# Patient Record
Sex: Female | Born: 1965 | Race: White | Hispanic: No | Smoking: Former smoker
Health system: Southern US, Community
[De-identification: ages and names within clinical notes are randomized; demographics above are authoritative.]

## PROBLEM LIST (undated history)

## (undated) DIAGNOSIS — M549 Dorsalgia, unspecified: Secondary | ICD-10-CM

## (undated) DIAGNOSIS — M459 Ankylosing spondylitis of unspecified sites in spine: Secondary | ICD-10-CM

## (undated) DIAGNOSIS — I10 Essential (primary) hypertension: Secondary | ICD-10-CM

## (undated) DIAGNOSIS — G8929 Other chronic pain: Secondary | ICD-10-CM

## (undated) DIAGNOSIS — M1990 Unspecified osteoarthritis, unspecified site: Secondary | ICD-10-CM

## (undated) DIAGNOSIS — I493 Ventricular premature depolarization: Secondary | ICD-10-CM

## (undated) DIAGNOSIS — S7290XA Unspecified fracture of unspecified femur, initial encounter for closed fracture: Secondary | ICD-10-CM

## (undated) DIAGNOSIS — K219 Gastro-esophageal reflux disease without esophagitis: Secondary | ICD-10-CM

## (undated) DIAGNOSIS — G43909 Migraine, unspecified, not intractable, without status migrainosus: Secondary | ICD-10-CM

## (undated) DIAGNOSIS — I7300 Raynaud's syndrome without gangrene: Secondary | ICD-10-CM

## (undated) DIAGNOSIS — F32A Depression, unspecified: Secondary | ICD-10-CM

## (undated) DIAGNOSIS — F909 Attention-deficit hyperactivity disorder, unspecified type: Secondary | ICD-10-CM

## (undated) DIAGNOSIS — R011 Cardiac murmur, unspecified: Secondary | ICD-10-CM

## (undated) HISTORY — PX: BACK SURGERY: SHX140

## (undated) HISTORY — PX: TUBAL LIGATION: SHX77

## (undated) HISTORY — PX: ARTHRODESIS: SHX136

## (undated) HISTORY — DX: Attention-deficit hyperactivity disorder, unspecified type: F90.9

## (undated) HISTORY — DX: Unspecified osteoarthritis, unspecified site: M19.90

## (undated) HISTORY — DX: Unspecified fracture of unspecified femur, initial encounter for closed fracture: S72.90XA

## (undated) HISTORY — PX: KNEE SURGERY: SHX244

## (undated) HISTORY — PX: VAGINAL HYSTERECTOMY: SUR661

## (undated) HISTORY — DX: Raynaud's syndrome without gangrene: I73.00

## (undated) HISTORY — PX: CERVICAL BIOPSY  W/ LOOP ELECTRODE EXCISION: SUR135

## (undated) HISTORY — PX: OSTEOTOMY: SHX137

## (undated) HISTORY — PX: BLADDER SURGERY: SHX569

## (undated) HISTORY — PX: BUNIONECTOMY: SHX129

## (undated) HISTORY — PX: HAMMER TOE SURGERY: SHX385

## (undated) HISTORY — PX: TOTAL KNEE ARTHROPLASTY: SHX125

## (undated) HISTORY — PX: ENDOMETRIAL BIOPSY: SHX622

## (undated) HISTORY — PX: HEEL SPUR SURGERY: SHX665

## (undated) HISTORY — DX: Ankylosing spondylitis of unspecified sites in spine: M45.9

## (undated) HISTORY — PX: SHOULDER SURGERY: SHX246

---

## 2001-07-30 ENCOUNTER — Encounter: Admission: RE | Admit: 2001-07-30 | Discharge: 2001-10-28 | Payer: Self-pay | Admitting: Internal Medicine

## 2003-08-16 ENCOUNTER — Ambulatory Visit (HOSPITAL_BASED_OUTPATIENT_CLINIC_OR_DEPARTMENT_OTHER): Admission: RE | Admit: 2003-08-16 | Discharge: 2003-08-16 | Payer: Self-pay | Admitting: Orthopedic Surgery

## 2003-09-16 ENCOUNTER — Encounter (HOSPITAL_COMMUNITY): Admission: RE | Admit: 2003-09-16 | Discharge: 2003-10-16 | Payer: Self-pay | Admitting: Orthopedic Surgery

## 2004-09-18 ENCOUNTER — Emergency Department (HOSPITAL_COMMUNITY): Admission: EM | Admit: 2004-09-18 | Discharge: 2004-09-18 | Payer: Self-pay | Admitting: *Deleted

## 2005-04-05 ENCOUNTER — Other Ambulatory Visit: Admission: RE | Admit: 2005-04-05 | Discharge: 2005-04-05 | Payer: Self-pay | Admitting: Obstetrics and Gynecology

## 2005-04-16 ENCOUNTER — Encounter: Admission: RE | Admit: 2005-04-16 | Discharge: 2005-04-16 | Payer: Self-pay | Admitting: Orthopedic Surgery

## 2005-05-07 ENCOUNTER — Ambulatory Visit (HOSPITAL_COMMUNITY): Admission: RE | Admit: 2005-05-07 | Discharge: 2005-05-08 | Payer: Self-pay | Admitting: Neurosurgery

## 2005-09-30 ENCOUNTER — Encounter: Admission: RE | Admit: 2005-09-30 | Discharge: 2005-09-30 | Payer: Self-pay | Admitting: Obstetrics and Gynecology

## 2007-02-10 ENCOUNTER — Other Ambulatory Visit: Admission: RE | Admit: 2007-02-10 | Discharge: 2007-02-10 | Payer: Self-pay | Admitting: Obstetrics & Gynecology

## 2007-03-19 DIAGNOSIS — Z8614 Personal history of Methicillin resistant Staphylococcus aureus infection: Secondary | ICD-10-CM

## 2007-03-19 HISTORY — DX: Personal history of Methicillin resistant Staphylococcus aureus infection: Z86.14

## 2007-06-08 ENCOUNTER — Inpatient Hospital Stay (HOSPITAL_COMMUNITY): Admission: RE | Admit: 2007-06-08 | Discharge: 2007-06-10 | Payer: Self-pay | Admitting: Orthopedic Surgery

## 2008-04-09 ENCOUNTER — Emergency Department (HOSPITAL_COMMUNITY): Admission: EM | Admit: 2008-04-09 | Discharge: 2008-04-09 | Payer: Self-pay | Admitting: Emergency Medicine

## 2008-07-05 ENCOUNTER — Encounter: Admission: RE | Admit: 2008-07-05 | Discharge: 2008-07-05 | Payer: Self-pay | Admitting: Orthopedic Surgery

## 2008-07-26 ENCOUNTER — Encounter: Admission: RE | Admit: 2008-07-26 | Discharge: 2008-07-26 | Payer: Self-pay | Admitting: Orthopedic Surgery

## 2008-11-10 ENCOUNTER — Encounter: Admission: RE | Admit: 2008-11-10 | Discharge: 2008-11-10 | Payer: Self-pay | Admitting: Orthopedic Surgery

## 2009-01-30 ENCOUNTER — Encounter: Admission: RE | Admit: 2009-01-30 | Discharge: 2009-01-30 | Payer: Self-pay | Admitting: Orthopedic Surgery

## 2009-05-11 ENCOUNTER — Ambulatory Visit (HOSPITAL_COMMUNITY): Admission: RE | Admit: 2009-05-11 | Discharge: 2009-05-11 | Payer: Self-pay | Admitting: Family Medicine

## 2009-10-06 LAB — HM DEXA SCAN

## 2010-04-08 ENCOUNTER — Encounter: Payer: Self-pay | Admitting: Orthopedic Surgery

## 2010-04-08 ENCOUNTER — Encounter: Payer: Self-pay | Admitting: Obstetrics & Gynecology

## 2010-07-24 ENCOUNTER — Ambulatory Visit (HOSPITAL_COMMUNITY)
Admission: RE | Admit: 2010-07-24 | Discharge: 2010-07-24 | Disposition: A | Discharge status: Home / Self Care | Payer: BC Managed Care – PPO | Source: Ambulatory Visit | Attending: Orthopedic Surgery | Admitting: Orthopedic Surgery

## 2010-07-24 ENCOUNTER — Other Ambulatory Visit (HOSPITAL_COMMUNITY): Payer: Self-pay | Admitting: Orthopedic Surgery

## 2010-07-24 ENCOUNTER — Encounter (HOSPITAL_COMMUNITY)
Admission: RE | Admit: 2010-07-24 | Discharge: 2010-07-24 | Disposition: A | Discharge status: Home / Self Care | Payer: BC Managed Care – PPO | Source: Ambulatory Visit | Attending: Orthopedic Surgery | Admitting: Orthopedic Surgery

## 2010-07-24 DIAGNOSIS — J42 Unspecified chronic bronchitis: Secondary | ICD-10-CM | POA: Insufficient documentation

## 2010-07-24 DIAGNOSIS — Z01810 Encounter for preprocedural cardiovascular examination: Secondary | ICD-10-CM | POA: Insufficient documentation

## 2010-07-24 DIAGNOSIS — Z01811 Encounter for preprocedural respiratory examination: Secondary | ICD-10-CM

## 2010-07-24 DIAGNOSIS — Z01818 Encounter for other preprocedural examination: Secondary | ICD-10-CM | POA: Insufficient documentation

## 2010-07-24 DIAGNOSIS — Z01812 Encounter for preprocedural laboratory examination: Secondary | ICD-10-CM | POA: Insufficient documentation

## 2010-07-24 LAB — URINALYSIS, ROUTINE W REFLEX MICROSCOPIC
Bilirubin Urine: NEGATIVE
Glucose, UA: NEGATIVE mg/dL
Hgb urine dipstick: NEGATIVE
Ketones, ur: NEGATIVE mg/dL
Nitrite: NEGATIVE
Protein, ur: NEGATIVE mg/dL
Specific Gravity, Urine: 1.029 (ref 1.005–1.030)
Urobilinogen, UA: 0.2 mg/dL (ref 0.0–1.0)
pH: 6 (ref 5.0–8.0)

## 2010-07-24 LAB — COMPREHENSIVE METABOLIC PANEL
ALT: 21 U/L (ref 0–35)
AST: 18 U/L (ref 0–37)
Albumin: 3.5 g/dL (ref 3.5–5.2)
Alkaline Phosphatase: 70 U/L (ref 39–117)
BUN: 17 mg/dL (ref 6–23)
CO2: 28 mEq/L (ref 19–32)
Calcium: 9.2 mg/dL (ref 8.4–10.5)
Chloride: 105 mEq/L (ref 96–112)
Creatinine, Ser: 0.59 mg/dL (ref 0.4–1.2)
GFR calc Af Amer: >60 mL/min (ref >60)
GFR calc non Af Amer: >60 mL/min (ref >60)
Glucose, Bld: 81 mg/dL (ref 70–99)
Potassium: 3.7 mEq/L (ref 3.5–5.1)
Sodium: 142 mEq/L (ref 135–145)
Total Bilirubin: 0.3 mg/dL (ref 0.3–1.2)
Total Protein: 6.2 g/dL (ref 6.0–8.3)

## 2010-07-24 LAB — PROTIME-INR
INR: 0.95 (ref 0.00–1.49)
Prothrombin Time: 12.9 seconds (ref 11.6–15.2)

## 2010-07-24 LAB — URINE MICROSCOPIC-ADD ON

## 2010-07-24 LAB — CBC
HCT: 37.7 % (ref 36.0–46.0)
Hemoglobin: 12.5 g/dL (ref 12.0–15.0)
MCH: 27.2 pg (ref 26.0–34.0)
MCHC: 33.2 g/dL (ref 30.0–36.0)
MCV: 82.1 fL (ref 78.0–100.0)
Platelets: 218 10*3/uL (ref 150–400)
RBC: 4.59 MIL/uL (ref 3.87–5.11)
RDW: 13.8 % (ref 11.5–15.5)
WBC: 8.2 10*3/uL (ref 4.0–10.5)

## 2010-07-24 LAB — DIFFERENTIAL
Basophils Absolute: 0 10*3/uL (ref 0.0–0.1)
Basophils Relative: 0 % (ref 0–1)
Eosinophils Absolute: 0.1 10*3/uL (ref 0.0–0.7)
Eosinophils Relative: 1 % (ref 0–5)
Lymphocytes Relative: 45 % (ref 12–46)
Lymphs Abs: 3.7 10*3/uL (ref 0.7–4.0)
Monocytes Absolute: 0.8 10*3/uL (ref 0.1–1.0)
Monocytes Relative: 10 % (ref 3–12)
Neutro Abs: 3.6 10*3/uL (ref 1.7–7.7)
Neutrophils Relative %: 44 % (ref 43–77)

## 2010-07-24 LAB — SURGICAL PCR SCREEN
MRSA, PCR: NEGATIVE
Staphylococcus aureus: NEGATIVE

## 2010-07-24 LAB — APTT: aPTT: 26 seconds (ref 24–37)

## 2010-07-24 LAB — HEMOGLOBIN A1C
Hgb A1c MFr Bld: 5.5 % (ref <5.7)
Mean Plasma Glucose: 111 mg/dL (ref <117)

## 2010-07-25 LAB — URINE CULTURE
Colony Count: >=100000
Culture  Setup Time: 201205081632

## 2010-07-30 ENCOUNTER — Inpatient Hospital Stay (HOSPITAL_COMMUNITY)
Admission: RE | Admit: 2010-07-30 | Discharge: 2010-08-01 | DRG: 209 | Disposition: A | Discharge status: Home Health | Payer: BC Managed Care – PPO | Source: Ambulatory Visit | Attending: Orthopedic Surgery | Admitting: Orthopedic Surgery

## 2010-07-30 DIAGNOSIS — K5900 Constipation, unspecified: Secondary | ICD-10-CM | POA: Diagnosis present

## 2010-07-30 DIAGNOSIS — Z87891 Personal history of nicotine dependence: Secondary | ICD-10-CM

## 2010-07-30 DIAGNOSIS — D62 Acute posthemorrhagic anemia: Secondary | ICD-10-CM | POA: Diagnosis not present

## 2010-07-30 DIAGNOSIS — F411 Generalized anxiety disorder: Secondary | ICD-10-CM | POA: Diagnosis present

## 2010-07-30 DIAGNOSIS — M1710 Unilateral primary osteoarthritis, unspecified knee: Principal | ICD-10-CM | POA: Diagnosis present

## 2010-07-30 DIAGNOSIS — K219 Gastro-esophageal reflux disease without esophagitis: Secondary | ICD-10-CM | POA: Diagnosis present

## 2010-07-30 LAB — URINALYSIS, ROUTINE W REFLEX MICROSCOPIC
Bilirubin Urine: NEGATIVE
Glucose, UA: NEGATIVE mg/dL
Hgb urine dipstick: NEGATIVE
Ketones, ur: NEGATIVE mg/dL
Nitrite: NEGATIVE
Protein, ur: NEGATIVE mg/dL
Specific Gravity, Urine: 1.021 (ref 1.005–1.030)
Urobilinogen, UA: 0.2 mg/dL (ref 0.0–1.0)
pH: 5 (ref 5.0–8.0)

## 2010-07-30 LAB — TYPE AND SCREEN
ABO/RH(D): B POS
Antibody Screen: NEGATIVE

## 2010-07-31 LAB — CBC
HCT: 35.8 % — ABNORMAL LOW (ref 36.0–46.0)
Hemoglobin: 11.9 g/dL — ABNORMAL LOW (ref 12.0–15.0)
MCH: 27.2 pg (ref 26.0–34.0)
MCHC: 33.2 g/dL (ref 30.0–36.0)
MCV: 81.9 fL (ref 78.0–100.0)
Platelets: 177 10*3/uL (ref 150–400)
RBC: 4.37 MIL/uL (ref 3.87–5.11)
RDW: 13.8 % (ref 11.5–15.5)
WBC: 8 10*3/uL (ref 4.0–10.5)

## 2010-07-31 LAB — BASIC METABOLIC PANEL
BUN: 10 mg/dL (ref 6–23)
CO2: 26 mEq/L (ref 19–32)
Calcium: 8.5 mg/dL (ref 8.4–10.5)
Chloride: 102 mEq/L (ref 96–112)
Creatinine, Ser: <0.47 mg/dL (ref 0.4–1.2)
Glucose, Bld: 118 mg/dL — ABNORMAL HIGH (ref 70–99)
Potassium: 3.8 mEq/L (ref 3.5–5.1)
Sodium: 135 mEq/L (ref 135–145)

## 2010-07-31 LAB — URINE CULTURE
Colony Count: NO GROWTH
Culture  Setup Time: 201205141150
Culture: NO GROWTH

## 2010-07-31 NOTE — Op Note (Signed)
NAMEARIKA, MAINER NO.:  [id number]   MEDICAL RECORD NO.:  [id number]          PATIENT TYPE:  INP   LOCATION:  2899                         FACILITY:  [facility]   PHYSICIAN:  Elana Alm. Thurston Hole, M.D. DATE OF BIRTH:  [birth year]   DATE OF PROCEDURE:  06/08/2007  DATE OF DISCHARGE:                               OPERATIVE REPORT   PREOPERATIVE DIAGNOSIS:  Right knee degenerative joint disease.   POSTOPERATIVE DIAGNOSES:  Right knee degenerative joint disease.   PROCEDURE:  1. Right total knee replacement using DePuy cemented total knee system      with #3 cemented femur, #4 cemented tibia with  15-mm polyethylene RP tibial spacer and 32-mm tibial polyethylene  cemented patella.  1. Right total knee computed-assisted navigation.   SURGEON:  Elana Alm. Thurston Hole, M.D.   ASSISTANT:  Julien Girt, P.A.   ANESTHESIA:  General.   OPERATIVE TIME:  1 hour and 45 minutes.   COMPLICATIONS:  None.   DESCRIPTION OF PROCEDURE:  Ms. Eversley was brought to the operating  room on June 08, 2007 after a femoral nerve block was placed in the  holding room by Anesthesia.  She was placed on the operating room table  in the supine position.  She received Ancef 2 grams IV preoperatively  for prophylaxis.  After being placed under general anesthesia, her right  knee was examined.  Range of motion for minus 5 down at 25 degrees with  medial femoral tibial subluxation with a varus deformity, knee stable,  ligamentous exam with normal patellar tracking.  She had a Foley  catheter in place under sterile conditions and received Ancef 2 g IV  preoperatively for prophylaxis.  The right leg and foot was sterilely  prepped and draped using sterile technique.  The leg was exsanguinated  and a thigh tourniquet elevated at 375-mm.  Initially, through a 15-cm  longitudinal incision placed over the patella, initial exposure was  made.  She had had a previous medial arthrotomy many years ago  for an  open meniscectomy but this incision was kept at least 8 to 9-cm away  from the previous incision to prevent any skin injury.  Then all  subcutaneous tissues were incised along the skin incision.  A median  arthrotomy was performed revealing excessive now normal appearing joint  fluid.  The articular surfaces were infected.  She had severe DJD with  grade IV changes throughout the mediolateral patellofemoral joints with  large osteophytes in  the femoral condyles which were removed and tibial  plateau which was removed.  The medial and lateral meniscal remnants  were removed.  There was no anterior cruciate ligament remaining.  At  this point, due to the severe deformity, the computer navigation system  was absolutely necessary.  Two pins were placed in the tibia through the  small incision areas in the medial tibia.  Two distal femoral pins were  also placed as well.  Each of the navigation system apparatuses were  then hooked to these pins and the navigation system was registered and  activated.  Initial deformities showed minus 5 degrees of  flexion and  minus 5degrees of varus with subluxation of the medial femur on the  tibia.  At this point, then using the navigation system, a distal 11-mm  cut was made on the distal femur correcting this deformity.  These cuts  were verified.  The size of the femur was measured to be a #3.  The #3  cutting jig was placed also using the navigation system and the  appropriate amount of external rotation of the knee cuts were made and  verified.  The proximal tibia was then cut removing 4-mm off the medial  or lower side, 8-mm off of the higher side, again with navigation being  used to verify the cuts.  After this was done, the spacer blocks were  placed in flexion and extension.  Blocks of 15-mm gave excellent  stability, excellent correction of her subluxation as well as her  flexion and varus deformities and these were verified with the   navigation system as well.  At this point, the #4 tibial baseplate  trowel was placed on the cut tibial surface and the Keel cut was made.  The #3 femoral PCL block cutter was placed on the distal femur and these  cuts were made.  At this point, the #3 femoral trowel was placed and  with the #4 tibial baseplate trowel and a  15-mm polyethylene RP tibial spacer, the knee was reduced and taken  through a range of motion from 0 to 125 degrees with excellent stability  and excellent correction of her flexion, varus, and subluxation  deformities with normal patella tracking. There was surfacing.  An 8-mm  cut was then made on the patella and 3 locking holes placed with a 32-mm  patella trowel which was placed and again patellofemoral tracking was  found to be normal.  At this point, with the navigation system verified  that all cuts were made perfect and exact with correction of her  deformities, the navigation system was deactivated and the pins were  removed.  The trowel components were then removed.  The knee was then  gently lavaged irrigated with 3 liters of saline.  The proximal tibia  was then exposed and a #4 tibial bas plate was cemented into position  with an excellent fit with excess cement being removed from around the  edges.  A #3 femoral component was cemented back and it was hammered  into position also with an excellent fit with excess cement being  removed  from around the edges.  The 15-mm polyethylene RP tibial spacer  was placed on tibial baseplate, the knee reduced, taken through range of  motion from 0 to 125 degrees with excellent stability and excellent  correction of her flexion, varus, and subluxation deformities and normal  patella tracking with the 32-mm polyethylene cemented back patella in  position.  At this point, it was felt that all components were of  excellent size, fit, and stability.  The wound was further irrigated  with saline.  The tourniquet was  released.  Hemostasis was obtained with  cautery.  The knee was then closed with #1-Ethibond suture over 2 medium  Hemovac drains.  The subcutaneous tissue was closed with 0 and 2-0  Vicryl, subcuticular layer closed with 4-0 Monocryl.  Sterile dressings  and a long leg splint applied.  The patient was then awakened,  extubated, and taken to the recovery room in stable condition.  Needle  and sponge count was correct x2 at the end of  the case. Neurovascular  status was normal postoperatively as well.      Robert A. Thurston Hole, M.D.  Electronically Signed     RAW/MEDQ  D:  06/08/2007  T:  06/08/2007  Job:  [id number]

## 2010-08-01 LAB — BASIC METABOLIC PANEL
BUN: 8 mg/dL (ref 6–23)
CO2: 28 mEq/L (ref 19–32)
Calcium: 8.4 mg/dL (ref 8.4–10.5)
Chloride: 99 mEq/L (ref 96–112)
Creatinine, Ser: <0.47 mg/dL (ref 0.4–1.2)
Glucose, Bld: 133 mg/dL — ABNORMAL HIGH (ref 70–99)
Potassium: 3.9 mEq/L (ref 3.5–5.1)
Sodium: 133 mEq/L — ABNORMAL LOW (ref 135–145)

## 2010-08-01 LAB — CBC
HCT: 30.4 % — ABNORMAL LOW (ref 36.0–46.0)
Hemoglobin: 10.2 g/dL — ABNORMAL LOW (ref 12.0–15.0)
MCH: 27.4 pg (ref 26.0–34.0)
MCHC: 33.6 g/dL (ref 30.0–36.0)
MCV: 81.7 fL (ref 78.0–100.0)
Platelets: 148 10*3/uL — ABNORMAL LOW (ref 150–400)
RBC: 3.72 MIL/uL — ABNORMAL LOW (ref 3.87–5.11)
RDW: 13.9 % (ref 11.5–15.5)
WBC: 8.8 10*3/uL (ref 4.0–10.5)

## 2010-08-03 NOTE — Discharge Summary (Signed)
Lauren Greene, Lauren Greene NO.:  [id number]   MEDICAL RECORD NO.:  [id number]          PATIENT TYPE:  INP   LOCATION:  5004                         FACILITY:  [facility]   PHYSICIAN:  Kirstin Shepperson, P.A.DATE OF BIRTH:  December 17, 1965   DATE OF ADMISSION:  06/08/2007  DATE OF DISCHARGE:  06/10/2007                               DISCHARGE SUMMARY   ADMITTING DIAGNOSES:  1. End-stage degenerative joint disease, right knee.  2. Depression.  3. Obesity.   DISCHARGE DIAGNOSES:  1. End-stage degenerative joint disease, right knee.  2. Depression.  3. Obesity.  4. Postoperative blood loss anemia.   HISTORY OF PRESENT ILLNESS:  The patient is a 45 year old white female  with a history of end-stage DJD of her right knee.  She has post-  traumatic DJD for multiple knee injuries as a child.  She has failed  conservative care including anti-inflammatories and interarticular  cortisone injections.  She understands the risks, benefits, and possible  complications of the right total knee replacement and is without  question.   PROCEDURES:  In house on June 08, 2007, the patient underwent a right  total knee replacement by Dr. Wyline Mood, a right femoral nerve block by  anesthesia, and tolerance of blood donation.  She tolerated all three  procedures well.  In the PACU, she was placed on CPM 0-90 degrees and  tolerated this without difficulty.  On postop day #1, the patient's  hemoglobin was 12.3, her temperature was 97.5.  She was metabolically  stable.  She tolerated her CPM 0-90 degrees.  She did very well with  physical therapy.  On our initial evaluation, she is ambulating 100  feet.  On postop day #2, hemoglobin was 11.1.  She was metabolically  stable and her INR was 1.2.  She tolerated 0-65 on our CPM.  She was  given Dulcolax suppository that produced effective results.  She was  discharged to home in stable condition.  Weightbearing as tolerated on a  regular diet.  CPM  0-65 degrees 8 hours a day, increasing by 5 degrees a  day.  Instructions on how stretch her right leg for 30 minutes twice a  day to work on extension.  Call us with increased pain, increased  swelling, increased redness, increased drainage, or a temperature  greater than 101.   DISCHARGE MEDICATIONS:  1. Celexa 40 mg daily.  2. Percocet 10 1-2 q. 4-6 h. p.r.n. pain.  3. Robaxin 500 mg 1-2 q. 4-6 h. p.r.n. muscle spasm.  4. Coumadin 1/2 tablet a day.  5. Lovenox 30 mg subcu q.12 h. until INR 2.0 or higher.  6. Xanax as directed.   She will follow up on June 22, 2007, with Dr. Wyline Mood.  They have been  instructed to call with increased pain, increased swelling, increased  drainage, increased redness, or temperature greater than 101.      Julien Girt, P.A.     KS/MEDQ  D:  07/13/2007  T:  07/14/2007  Job:  [id number]

## 2010-08-03 NOTE — Op Note (Signed)
Lauren Greene, JERDE NO.:  [id number]   MEDICAL RECORD NO.:  [id number]                   PATIENT TYPE:  AMB   LOCATION:  DSC                                  FACILITY:  [facility]   PHYSICIAN:  Robert A. Thurston Hole, M.D.              DATE OF BIRTH:  05/15/1965   DATE OF PROCEDURE:  08/16/2003  DATE OF DISCHARGE:                                 OPERATIVE REPORT   PREOPERATIVE DIAGNOSES:  Right knee post traumatic degenerative joint  disease with medial and lateral meniscal tears, chondromalacia and  synovitis.   POSTOPERATIVE DIAGNOSES:  Right knee post traumatic degenerative joint  disease with medial and lateral meniscal tears, chondromalacia and  synovitis.   PROCEDURES:  1. Right knee EUA, followed by arthroscopic partial medial and lateral     meniscectomy.  2. Right knee chondroplasty with partial synovectomy.   SURGEON:  Elana Alm. Thurston Hole, M.D.   ANESTHESIA:  General.   OPERATIVE TIME:  Thirty minutes.   COMPLICATIONS:  None.   INDICATIONS FOR PROCEDURE:  Ms. Leccese is a 45 year old woman who has had  significant rather severe post-traumatic DJD of her right knee with  increasing pain and locking in the knee consistent with possible loose  bodies versus meniscal tearing and synovitis and is now to undergo  arthroscopy due to failed conservative care.   DESCRIPTION OF PROCEDURE:  Ms. Tallo was brought to the operating room on  08/16/2003, placed on the operating table in the supine position.  After an  adequate level of general anesthesia was obtained her right knee was  examined under anesthesia.  Range of motion from 0 to 125 degrees, 1+  Lachman, the knee otherwise ligamentous exam.  The knee was sterilely  injected with 0.25% Marcaine with epinephrine.  The right leg was prepped  using sterile dura-prep and draped using sterile technique.  Originally  through an anterolateral portal the arthroscope with a pump attached was  placed  and through an anteromedial portal an arthroscopic probe was placed.  On initial inspection in the medial compartment she was found to have 50%  grade 4 and the rest grade 3 chondromalacia and this was debrided.  She had  tearing of the posterior and medial horn of the medial meniscus of which 50%  was resected back to a stable but degenerative rim.  The intercondylar notch  was inspected.  The anterior cruciate ligament was deficient with  significant spurs in the notch.  These were well fixed osteophytes and were  not removed because they did not impinge on motion.  The PCL was intact.  The lateral compartment inspected.  She had 40% grade 4 and the rest grade 3  chondromalacia and this was debrided.  She had tearing of the posterior and  lateral horn of the lateral meniscus, of which 50% was resected back to a  stable but degenerative rim.  The patellofemoral joint showed 50%  grade 3  chondromalacia on the patella and femoral groove and this was debrided.  The  patella tracked normally.  Significant synovitis in the medial and lateral  gutters was debrided.  I thoroughly inspected the medial and lateral gutters  for loose bodies but none were found.  There were large, well fixed  osteophytes in the medial and lateral gutters and on the medial and lateral  femoral condyles but again these were not impinging on motion and were not  removed.  After this was done it was felt that all pathology had been  satisfactorily addressed.  The instruments were removed.  The portals were  closed with 3-0 nylon suture and injected with 0.25% Marcaine with  epinephrine and 4 mg of morphine.  Sterile dressings applied and the patient  awakened and taken to the recovery room in stable condition.   FOLLOWUP CARE:  Ms. Passarelli will be followed as an outpatient on Percocet  and Voltaren.  We will see back in the office in a week for sutures out and  followup.                                                Robert A. Thurston Hole, M.D.    RAW/MEDQ  D:  08/16/2003  T:  08/16/2003  Job:  [id number]

## 2010-08-03 NOTE — Op Note (Signed)
NAMESHAMMARA, JARRETT NO.:  [id number]   MEDICAL RECORD NO.:  [id number]          PATIENT TYPE:  OIB   LOCATION:  3172                         FACILITY:  [facility]   PHYSICIAN:  Danae Orleans. Venetia Maxon, M.D.  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   OPERATION PERFORMED:  Left L4-5 microdiskectomy with microdissection.   SURGEON:  Danae Orleans. Venetia Maxon, M.D.   ASSISTANT:  Stefani Dama, M.D.   ANESTHESIA:  General endotracheal.   ESTIMATED BLOOD LOSS:  Minimal.   COMPLICATIONS:  None.   DISPOSITION:  Recovery.   INDICATIONS FOR PROCEDURE:  Lauren Greene is a 45 year old with a large  free fragment disk herniation at L4-5 on the left with left foraminal nerve  root compression, severe pain and lumbar radiculopathy.  It was elected to  take her to surgery for microdiskectomy.  She is morbidly obese.   DESCRIPTION OF PROCEDURE:  Ms. Poster was brought to the operating room.  Following the satisfactory and uncomplicated induction of general  endotracheal anesthesia and placement of intravenous lines, the patient was  placed in a prone position on the Wilson frame.  The low back was then  prepped and draped in the usual sterile fashion.  The area of planned  incision was infiltrated with 0.25% Marcaine, 0.5% lidocaine, 1:200,000  epinephrine.  An incision was made in the midline and carried through  approximately 4 inches of adipose tissue to the lumbodorsal fascia which was  incised on the left side of midline.  Subperiosteal dissection was then  performed exposing the L4-5 interspace on the left and  intraoperative x-ray  confirmed correct orientation with a marker at the L4-5 level.  Subsequently  hemisemilaminectomy of L4 was performed with a high speed drill and  completed with Kerrison rongeur and lateral recess was also decompressed and  foraminotomies performed overlying the L5 nerve root.  The ligamentum flavum  was detached and removed in piecemeal fashion.  Microscope was brought into  the field and using microdissection technique, the thecal sac was mobilized  medially and a variety of probes were then used to enter the space just  above the disk into the foramen and multiple large fragments of disk  material were removed with resultant significant decompression of the L4 and  L5 nerve roots and the thecal sac.  The spinal canal was then palpated.  While there was a bulge of the disk, it did not appear to be severe and  there did not appear to be any additional loose disk material.  Consequently, it was elected not to incise the annulus.  Hemostasis was  assured.  The wound was then bathed in 80 mg Depo-Medrol and 2 mL fentanyl.  A self-retaining retractor was removed.  The lumbodorsal fascia was closed  with  0 Vicryl suture.  The subcutaneous tissue were reapproximated with 2-0  Vicryl interrupted inverted sutures and the skin edges were reapproximated  with interrupted 3-0 Vicryl subcuticular stitch.  The wound was dressed with  Dermabond.  The patient was extubated in the operating room and taken to the  recovery room in stable and satisfactory condition having tolerated the  operation well.  Counts were correct at the end of the case.      Danae Orleans. Venetia Maxon, M.D.  Electronically Signed     JDS/MEDQ  D:  05/07/2005  T:  05/08/2005  Job:  [id number]

## 2010-08-07 NOTE — Op Note (Signed)
Lauren Greene, Lauren Greene NO.:  [id number]  MEDICAL RECORD NO.:  [id number]           PATIENT TYPE:  I  LOCATION:  5024                         FACILITY:  [facility]  PHYSICIAN:  Kerry Odonohue A. Thurston Hole, M.D. DATE OF BIRTH:  [birth year]  DATE OF PROCEDURE:  07/30/2010 DATE OF DISCHARGE:  07/24/2010                              OPERATIVE REPORT   PREOPERATIVE DIAGNOSIS:  Left knee degenerative joint disease.  POSTOPERATIVE DIAGNOSIS:  Left knee degenerative joint disease.  PROCEDURE: 1. Left total knee replacement using DePuy cemented total knee system     with #3, cemented femur #4, cemented tibia with 12.5 mm     polyethylene RP tibial spacer with 32 mm polyethylene cemented     patella. 2. Zinacef impregnated cement. 3. Left knee lateral retinacular release.  SURGEON:  Elana Alm. Thurston Hole, MD  ASSISTANT:  Julien Girt, PA-C  ANESTHESIA:  General.  OPERATIVE TIME:  1 hour and 40 minutes.  COMPLICATIONS:  None. DESCRIPTION OF PROCEDURE:  [name] was brought to the operating room on Jul 30, 2010, placed on operating table in supine position. After being placed under general anesthesia, her left knee was examined. Range of motion from -5 to 125 degrees moderate valgus deformity on knee stable ligamentous exam with lateral patellar tracking.  She had a Foley catheter placed under sterile conditions.  She received Ancef 2 g IV preoperatively for prophylaxis.  Left leg was then prepped using sterile DuraPrep and draped using sterile technique.  Time-out procedure was called and the correct left knee identified.  Left leg was exsanguinated and thigh tourniquet elevated at 375 mm.  Initially through a 15-cm longitudinal incision based over the patella, initial exposure was made. Underlying subcutaneous tissues were incised along with skin incision. A median arthrotomy was performed revealing an excessive amount of normal-appearing joint fluid.  The articular  surfaces were inspected. She had grade 4 changes medially and laterally on the patellofemoral joint.  Osteophytes removed from the femoral condyles and tibial plateau.  The medial and lateral meniscal remnants were removed as well as the anterior cruciate ligament.  Intramedullary drill was then drilled up the femoral canal for placement and distal femoral cutting jig, which was placed in the appropriate manner rotation and a distal 11- mm cut was made based off the medial side.  After this was done, then the distal femoral sizing guide was placed and #3 was found to be in the appropriate size.  #3 cutting jig was then placed in the appropriate manner maximum rotation and then these cuts were made.  After this was done, then the proximal tibia was exposed.  The tibial spines were removed with an oscillating saw.  Intramedullary drill was drilled down the tibial canal for placement of proximal tibial cutting jig, which was placed in appropriate manner rotation and a proximal 2-mm cut was made based off the lateral or lower side and resecting 8 mm off the medial side.  At this point, spacer blocks were placed in flexion and extension.  12.5-mm blocks gave excellent balancing, excellent stability, and excellent correction of her flexion and valgus deformities.  At this  point, the #4 tibial baseplate trial was placed on the cut tibial surface with an excellent fit and a keel cut was made. The PCL box cutter was then placed under the distal femur and these cuts were made.  At this point, the #3 femoral trial was placed with a #4 tibial baseplate trial and a 12.5-mm polyethylene RP tibial spacer, knee was reduced, taken through range of motion from 0 to 125 degrees with excellent stability and excellent correction of her flexion and valgus deformities.  The remained significant lateral patellar subluxation which she had preoperatively as well.  A lateral retinacular release was carried out  improving patellar tracking to normal.  At this point, a resurfacing 9-mm cut was made on the patella and 3 locking holes placed for 32-mm patellar trial and again patellofemoral tracking was evaluated and found to be normal.  At this point, we felt that all trial components were of excellent size and stability.  They were then removed.  The knee was then jet lavage irrigated with 3 L of saline. Proximal tibia was then exposed and #4 tibial baseplate with Zinacef impregnated cement backing was hammered in position with an excellent fit with excess cement being removed around the edges.  #3 femoral component with cement backing was hammered into position also with an excellent fit with excess cement being removed from around the edges. The 12.5-mm polyethylene RP tibial spacer was placed on tibial baseplate.  The knee reduced, taken through range of motion from 0 to 125 degrees with excellent stability and excellent correction of her flexion and valgus deformities.  The 32-mm polyethylene cement backed patella was then placed in its position and held there with a clamp. After the cement hardened again patellofemoral tracking was evaluated and found to be normal.  At this point, we felt that all components were of excellent size and stability.  The wound was further irrigated with saline.  The arthrotomy was then closed with #1 Ethilon suture over 2 medium Hemovac drains.  Subcutaneous tissues closed with 0 and 2-0 Vicryl. Subcuticular layer closed with 4-0 Monocryl.  Sterile dressings and a long-leg splint applied.  The patient was then awakened, extubated, and taken to recovery in stable condition.  Needle and sponge counts were correct x2 at the end of the case.  Neurovascular status normal.  Pulses 2+ and symmetric.     Reida Hem A. Thurston Hole, M.D.     RAW/MEDQ  D:  07/31/2010  T:  07/31/2010  Job:  [id number]  Electronically Signed by Salvatore Marvel M.D. on 08/07/2010 82:95:62 PM

## 2010-08-21 ENCOUNTER — Ambulatory Visit
Admission: RE | Admit: 2010-08-21 | Discharge: 2010-08-21 | Disposition: A | Discharge status: Home / Self Care | Payer: BC Managed Care – PPO | Source: Ambulatory Visit | Attending: Orthopedic Surgery | Admitting: Orthopedic Surgery

## 2010-08-21 ENCOUNTER — Other Ambulatory Visit: Payer: Self-pay | Admitting: Orthopedic Surgery

## 2010-08-21 DIAGNOSIS — M7989 Other specified soft tissue disorders: Secondary | ICD-10-CM

## 2010-10-05 ENCOUNTER — Other Ambulatory Visit: Payer: Self-pay | Admitting: Obstetrics & Gynecology

## 2010-10-05 DIAGNOSIS — Z1231 Encounter for screening mammogram for malignant neoplasm of breast: Secondary | ICD-10-CM

## 2010-10-05 LAB — HM MAMMOGRAPHY

## 2010-10-08 LAB — HM PAP SMEAR

## 2010-10-10 ENCOUNTER — Ambulatory Visit
Admission: RE | Admit: 2010-10-10 | Discharge: 2010-10-10 | Disposition: A | Discharge status: Home / Self Care | Payer: BC Managed Care – PPO | Source: Ambulatory Visit | Attending: Obstetrics & Gynecology | Admitting: Obstetrics & Gynecology

## 2010-10-10 DIAGNOSIS — Z1231 Encounter for screening mammogram for malignant neoplasm of breast: Secondary | ICD-10-CM

## 2010-12-10 LAB — CBC
HCT: 31.8 — ABNORMAL LOW
HCT: 35 — ABNORMAL LOW
HCT: 42
Hemoglobin: 11.1 — ABNORMAL LOW
Hemoglobin: 12.3
Hemoglobin: 14.4
MCHC: 34.3
MCHC: 34.8
MCHC: 35
MCV: 82.5
MCV: 83.2
MCV: 84
Platelets: 169
Platelets: 226
Platelets: 244
RBC: 3.83 — ABNORMAL LOW
RBC: 4.24
RBC: 5.01
RDW: 13.2
RDW: 13.2
RDW: 13.6
WBC: 12.6 — ABNORMAL HIGH
WBC: 5.7
WBC: 7.3

## 2010-12-10 LAB — URINALYSIS, ROUTINE W REFLEX MICROSCOPIC
Bilirubin Urine: NEGATIVE
Glucose, UA: NEGATIVE
Ketones, ur: NEGATIVE
Leukocytes, UA: NEGATIVE
Nitrite: NEGATIVE
Protein, ur: 30 — AB
Specific Gravity, Urine: 1.027
Urobilinogen, UA: 1
pH: 6.5

## 2010-12-10 LAB — BASIC METABOLIC PANEL
BUN: 7
BUN: 7
CO2: 27
CO2: 29
Calcium: 8.5
Calcium: 8.5
Chloride: 104
Chloride: 105
Creatinine, Ser: 0.56
Creatinine, Ser: 0.58
GFR calc Af Amer: >60
GFR calc Af Amer: >60
GFR calc non Af Amer: >60
GFR calc non Af Amer: >60
Glucose, Bld: 114 — ABNORMAL HIGH
Glucose, Bld: 127 — ABNORMAL HIGH
Potassium: 3.8
Potassium: 3.9
Sodium: 138
Sodium: 139

## 2010-12-10 LAB — COMPREHENSIVE METABOLIC PANEL
ALT: 26
AST: 26
Albumin: 3.7
Alkaline Phosphatase: 72
BUN: 11
CO2: 27
Calcium: 9.1
Chloride: 107
Creatinine, Ser: 0.69
GFR calc Af Amer: >60
GFR calc non Af Amer: >60
Glucose, Bld: 80
Potassium: 4.5
Sodium: 139
Total Bilirubin: 0.4
Total Protein: 6.4

## 2010-12-10 LAB — URINE CULTURE: Colony Count: 8000

## 2010-12-10 LAB — URINE MICROSCOPIC-ADD ON

## 2010-12-10 LAB — DIFFERENTIAL
Basophils Absolute: 0.1
Basophils Relative: 2 — ABNORMAL HIGH
Eosinophils Absolute: 0.2
Eosinophils Relative: 3
Lymphocytes Relative: 38
Lymphs Abs: 2.2
Monocytes Absolute: 0.5
Monocytes Relative: 9
Neutro Abs: 2.7
Neutrophils Relative %: 48

## 2010-12-10 LAB — TYPE AND SCREEN
ABO/RH(D): B POS
Antibody Screen: NEGATIVE

## 2010-12-10 LAB — PROTIME-INR
INR: 0.9
INR: 1
INR: 1.2
Prothrombin Time: 12.7
Prothrombin Time: 13.4
Prothrombin Time: 15.7 — ABNORMAL HIGH

## 2010-12-10 LAB — APTT: aPTT: 33

## 2010-12-10 LAB — ABO/RH: ABO/RH(D): B POS

## 2012-06-06 ENCOUNTER — Emergency Department (HOSPITAL_COMMUNITY): Payer: Medicaid Other

## 2012-06-06 ENCOUNTER — Emergency Department (HOSPITAL_COMMUNITY)
Admission: EM | Admit: 2012-06-06 | Discharge: 2012-06-06 | Disposition: A | Discharge status: Home / Self Care | Payer: Medicaid Other | Attending: Emergency Medicine | Admitting: Emergency Medicine

## 2012-06-06 ENCOUNTER — Encounter (HOSPITAL_COMMUNITY): Payer: Self-pay | Admitting: Emergency Medicine

## 2012-06-06 DIAGNOSIS — Z8659 Personal history of other mental and behavioral disorders: Secondary | ICD-10-CM | POA: Insufficient documentation

## 2012-06-06 DIAGNOSIS — Z87891 Personal history of nicotine dependence: Secondary | ICD-10-CM | POA: Insufficient documentation

## 2012-06-06 DIAGNOSIS — Z8679 Personal history of other diseases of the circulatory system: Secondary | ICD-10-CM | POA: Insufficient documentation

## 2012-06-06 DIAGNOSIS — Z8739 Personal history of other diseases of the musculoskeletal system and connective tissue: Secondary | ICD-10-CM | POA: Insufficient documentation

## 2012-06-06 DIAGNOSIS — R51 Headache: Secondary | ICD-10-CM

## 2012-06-06 DIAGNOSIS — R112 Nausea with vomiting, unspecified: Secondary | ICD-10-CM | POA: Insufficient documentation

## 2012-06-06 HISTORY — DX: Dorsalgia, unspecified: M54.9

## 2012-06-06 HISTORY — DX: Unspecified osteoarthritis, unspecified site: M19.90

## 2012-06-06 HISTORY — DX: Migraine, unspecified, not intractable, without status migrainosus: G43.909

## 2012-06-06 MED ORDER — PROMETHAZINE HCL 25 MG PO TABS: 25.0000 mg | ORAL_TABLET | Freq: Four times a day (QID) | ORAL | Status: DC | PRN

## 2012-06-06 MED ORDER — DIPHENHYDRAMINE HCL 50 MG/ML IJ SOLN
50.0000 mg | Freq: Once | INTRAMUSCULAR | Status: AC
  Administered 2012-06-06: 50 mg via INTRAVENOUS
  Filled 2012-06-06: qty 1

## 2012-06-06 MED ORDER — METOCLOPRAMIDE HCL 10 MG PO TABS: 10.0000 mg | ORAL_TABLET | Freq: Four times a day (QID) | ORAL | Status: DC | PRN

## 2012-06-06 MED ORDER — KETOROLAC TROMETHAMINE 30 MG/ML IJ SOLN
30.0000 mg | Freq: Once | INTRAMUSCULAR | Status: AC
  Administered 2012-06-06: 30 mg via INTRAVENOUS
  Filled 2012-06-06: qty 1

## 2012-06-06 MED ORDER — METOCLOPRAMIDE HCL 5 MG/ML IJ SOLN
10.0000 mg | Freq: Once | INTRAMUSCULAR | Status: AC
  Administered 2012-06-06: 10 mg via INTRAVENOUS
  Filled 2012-06-06: qty 2

## 2012-06-06 MED ORDER — SODIUM CHLORIDE 0.9 % IV BOLUS (SEPSIS)
1000.0000 mL | Freq: Once | INTRAVENOUS | Status: AC
  Administered 2012-06-06: 1000 mL via INTRAVENOUS

## 2012-06-06 MED ORDER — SODIUM CHLORIDE 0.9 % IV SOLN
INTRAVENOUS | Status: DC
  Administered 2012-06-06: 14:00:00 via INTRAVENOUS

## 2012-06-06 NOTE — ED Notes (Signed)
Patient c/o migraine with nausea and vomiting x3 days. Patient 's best friend reports patient sweating at a lot. Patient states "worst headache ever." Has not been able to get out of bed, eat, or drink x3 days.

## 2012-06-06 NOTE — ED Provider Notes (Signed)
History     CSN: [id number]  Arrival date & time 06/06/12  1207   First MD Initiated Contact with Patient 06/06/12 1306      Chief Complaint  Patient presents with  . Migraine     HPI Pt was seen at 1315.  Per pt, c/o gradual onset and persistence of constant acute flair of her chronic migraine headache for the past 3 days. States the headache began as "dull" and "throbbing," and has gradually worsened and become constant over the past 3 days.  States the took tylenol, naprosyn, percocet and vicodin without relief.  Describes the headache as located in the front of her head, and per her usual chronic migraine headache pain pattern for since she "was a teenager." Has been associated with several episodes of N/V.  Denies headache was sudden or maximal in onset or at any time.  Denies visual changes, no focal motor weakness, no tingling/numbness in extremities, no fevers, no neck pain, no rash, no abd pain, no diarrhea.      Past Medical History  Diagnosis Date  . Arthritis   . Migraine   . Back pain     Past Surgical History  Procedure Laterality Date  . Total knee arthroplasty      x2  . Heel spur surgery    . Back surgery      Family History  Problem Relation Age of Onset  . Cancer Mother   . Heart failure Father   . Cancer Father   . Cancer Other   . Heart failure Other     History  Substance Use Topics  . Smoking status: Former Smoker -- 10 years    Types: Cigarettes    Quit date: 03/18/1998  . Smokeless tobacco: Never Used  . Alcohol Use: No    OB History   Grav Para Term Preterm Abortions TAB SAB Ect Mult Living   3 3 3       3       Review of Systems ROS: Statement: All systems negative except as marked or noted in the HPI; Constitutional: Negative for fever and chills. ; ; Eyes: Negative for eye pain, redness and discharge. ; ; ENMT: Negative for ear pain, hoarseness, nasal congestion, sinus pressure and sore throat. ; ; Cardiovascular: Negative for  chest pain, palpitations, diaphoresis, dyspnea and peripheral edema. ; ; Respiratory: Negative for cough, wheezing and stridor. ; ; Gastrointestinal: +N/V. Negative for diarrhea, abdominal pain, blood in stool, hematemesis, jaundice and rectal bleeding. . ; ; Genitourinary: Negative for dysuria, flank pain and hematuria. ; ; Musculoskeletal: Negative for back pain and neck pain. Negative for swelling and trauma.; ; Skin: Negative for pruritus, rash, abrasions, blisters, bruising and skin lesion.; ; Neuro: +headache. Negative for lightheadedness and neck stiffness. Negative for weakness, altered level of consciousness , altered mental status, extremity weakness, paresthesias, involuntary movement, seizure and syncope.       Allergies  Bee venom  Home Medications   Current Outpatient Rx  Name  Route  Sig  Dispense  Refill  . acetaminophen (TYLENOL) 500 MG tablet   Oral   Take 500 mg by mouth every 6 (six) hours as needed for pain.         Marland Kitchen venlafaxine XR (EFFEXOR-XR) 150 MG 24 hr capsule   Oral   Take 150 mg by mouth daily.           BP 140/89  Pulse 73  Temp(Src) 97.5 F (36.4 C) (Oral)  Resp 20  Ht 5' 9.5" (1.765 m)  Wt 280 lb (127.007 kg)  BMI 40.77 kg/m2  SpO2 100%  Physical Exam 1320: Physical examination:  Nursing notes reviewed; Vital signs and O2 SAT reviewed;  Constitutional: Well developed, Well nourished, Well hydrated, Laying left side eyes closed.; Head:  Normocephalic, atraumatic; Eyes: EOMI, PERRL, No scleral icterus. Not photophobic on exam.; ENMT: Mouth and pharynx normal, Mucous membranes moist; Neck: Supple, Full range of motion, No lymphadenopathy. No meningeal signs; Cardiovascular: Regular rate and rhythm, No murmur, rub, or gallop; Respiratory: Breath sounds clear & equal bilaterally, No rales, rhonchi, wheezes.  Speaking full sentences with ease, Normal respiratory effort/excursion; Chest: Nontender, Movement normal; Abdomen: Soft, Nontender, Nondistended,  Normal bowel sounds;; Extremities: Pulses normal, No tenderness, No edema, No calf edema or asymmetry.; Neuro: AA&Ox3, Major CN grossly intact.  Speech clear. No facial droop. No gross focal motor or sensory deficits in extremities.; Skin: Color normal, Warm, Dry.    ED Course  Procedures     MDM  MDM Reviewed: previous chart, nursing note and vitals Interpretation: CT scan   Ct Head Wo Contrast 06/06/2012  *RADIOLOGY REPORT*  Clinical Data: Headache  CT HEAD WITHOUT CONTRAST  Technique:  Contiguous axial images were obtained from the base of the skull through the vertex without contrast.  Comparison: None.  Findings: There is no evidence of acute intracranial hemorrhage, brain edema, mass lesion, acute infarction,   mass effect, or midline shift. Acute infarct may be inapparent on noncontrast CT. No other intra-axial abnormalities are seen, and the ventricles and sulci are within normal limits in size and symmetry.   No abnormal extra-axial fluid collections or masses are identified.  No significant calvarial abnormality.  IMPRESSION: 1. Negative for bleed or other acute intracranial process.   Original Report Authenticated By: D. Andria Rhein, MD       1650:  Pt has slept most of her ED visit.  Has woken up now, states she feels better and wants to go home. No red flags, will continue to tx symptomatically. Dx and testing d/w pt and friend.  Questions answered.  Verb understanding, agreeable to d/c home with outpt f/u.           Laray Anger, DO 06/08/12 1319

## 2012-07-08 ENCOUNTER — Ambulatory Visit (INDEPENDENT_AMBULATORY_CARE_PROVIDER_SITE_OTHER): Payer: Medicaid Other | Admitting: Family Medicine

## 2012-07-08 ENCOUNTER — Encounter: Payer: Self-pay | Admitting: Family Medicine

## 2012-07-08 VITALS — BP 130/80 | HR 70 | Ht 69.5 in | Wt 295.1 lb

## 2012-07-08 DIAGNOSIS — R5381 Other malaise: Secondary | ICD-10-CM

## 2012-07-08 DIAGNOSIS — F988 Other specified behavioral and emotional disorders with onset usually occurring in childhood and adolescence: Secondary | ICD-10-CM | POA: Insufficient documentation

## 2012-07-08 DIAGNOSIS — Z0000 Encounter for general adult medical examination without abnormal findings: Secondary | ICD-10-CM

## 2012-07-08 DIAGNOSIS — R5383 Other fatigue: Secondary | ICD-10-CM

## 2012-07-08 MED ORDER — AMPHETAMINE-DEXTROAMPHET ER 20 MG PO CP24: 20.0000 mg | ORAL_CAPSULE | ORAL | Status: DC

## 2012-07-08 MED ORDER — AMPHETAMINE-DEXTROAMPHET ER 20 MG PO CP24: ORAL_CAPSULE | ORAL | Status: DC

## 2012-07-08 MED ORDER — DICLOFENAC SODIUM 75 MG PO TBEC: 75.0000 mg | DELAYED_RELEASE_TABLET | Freq: Two times a day (BID) | ORAL | Status: DC

## 2012-07-08 MED ORDER — VENLAFAXINE HCL ER 150 MG PO CP24: 150.0000 mg | ORAL_CAPSULE | Freq: Every day | ORAL | Status: DC

## 2012-07-08 NOTE — Patient Instructions (Addendum)
Eat to Live by Alba Destine  Www.diabetes.org  Minimize starches  Diabetes Meal Planning Guide The diabetes meal planning guide is a tool to help you plan your meals and snacks. It is important for people with diabetes to manage their blood glucose (sugar) levels. Choosing the right foods and the right amounts throughout your day will help control your blood glucose. Eating right can even help you improve your blood pressure and reach or maintain a healthy weight. CARBOHYDRATE COUNTING MADE EASY When you eat carbohydrates, they turn to sugar. This raises your blood glucose level. Counting carbohydrates can help you control this level so you feel better. When you plan your meals by counting carbohydrates, you can have more flexibility in what you eat and balance your medicine with your food intake. Carbohydrate counting simply means adding up the total amount of carbohydrate grams in your meals and snacks. Try to eat about the same amount at each meal. Foods with carbohydrates are listed below. Each portion below is 1 carbohydrate serving or 15 grams of carbohydrates. Ask your dietician how many grams of carbohydrates you should eat at each meal or snack. Grains and Starches  1 slice bread.   English muffin or hotdog/hamburger bun.   cup cold cereal (unsweetened).   cup cooked pasta or rice.   cup starchy vegetables (corn, potatoes, peas, beans, winter squash).  1 tortilla (6 inches).   bagel.  1 waffle or pancake (size of a CD).   cup cooked cereal.  4 to 6 small crackers. *Whole grain is recommended. Fruit  1 cup fresh unsweetened berries, melon, papaya, pineapple.  1 small fresh fruit.   banana or mango.   cup fruit juice (4 oz unsweetened).   cup canned fruit in natural juice or water.  2 tbs dried fruit.  12 to 15 grapes or cherries. Milk and Yogurt  1 cup fat-free or 1% milk.  1 cup soy milk.  6 oz light yogurt with sugar-free sweetener.  6 oz  low-fat soy yogurt.  6 oz plain yogurt. Vegetables  1 cup raw or  cup cooked is counted as 0 carbohydrates or a "free" food.  If you eat 3 or more servings at 1 meal, count them as 1 carbohydrate serving. Other Carbohydrates   oz chips or pretzels.   cup ice cream or frozen yogurt.   cup sherbet or sorbet.  2 inch square cake, no frosting.  1 tbs honey, sugar, jam, jelly, or syrup.  2 small cookies.  3 squares of graham crackers.  3 cups popcorn.  6 crackers.  1 cup broth-based soup.  Count 1 cup casserole or other mixed foods as 2 carbohydrate servings.  Foods with less than 20 calories in a serving may be counted as 0 carbohydrates or a "free" food. You may want to purchase a book or computer software that lists the carbohydrate gram counts of different foods. In addition, the nutrition facts panel on the labels of the foods you eat are a good source of this information. The label will tell you how big the serving size is and the total number of carbohydrate grams you will be eating per serving. Divide this number by 15 to obtain the number of carbohydrate servings in a portion. Remember, 1 carbohydrate serving equals 15 grams of carbohydrate. SERVING SIZES Measuring foods and serving sizes helps you make sure you are getting the right amount of food. The list below tells how big or small some common serving sizes are.  1 oz.........4 stacked dice.  3 oz........Marland KitchenDeck of cards.  1 tsp.......Marland KitchenTip of little finger.  1 tbs......Marland KitchenMarland KitchenThumb.  2 tbs.......Marland KitchenGolf ball.   cup......Marland KitchenHalf of a fist.  1 cup.......Marland KitchenA fist. SAMPLE DIABETES MEAL PLAN Below is a sample meal plan that includes foods from the grain and starches, dairy, vegetable, fruit, and meat groups. A dietician can individualize a meal plan to fit your calorie needs and tell you the number of servings needed from each food group. However, controlling the total amount of carbohydrates in your meal or snack is  more important than making sure you include all of the food groups at every meal. You may interchange carbohydrate containing foods (dairy, starches, and fruits). The meal plan below is an example of a 2000 calorie diet using carbohydrate counting. This meal plan has 17 carbohydrate servings. Breakfast  1 cup oatmeal (2 carb servings).   cup light yogurt (1 carb serving).  1 cup blueberries (1 carb serving).   cup almonds. Snack  1 large apple (2 carb servings).  1 low-fat string cheese stick. Lunch  Chicken breast salad.  1 cup spinach.   cup chopped tomatoes.  2 oz chicken breast, sliced.  2 tbs low-fat [place] dressing.  12 whole-wheat crackers (2 carb servings).  12 to 15 grapes (1 carb serving).  1 cup low-fat milk (1 carb serving). Snack  1 cup carrots.   cup hummus (1 carb serving). Dinner  3 oz broiled salmon.  1 cup brown rice (3 carb servings). Snack  1  cups steamed broccoli (1 carb serving) drizzled with 1 tsp olive oil and lemon juice.  1 cup light pudding (2 carb servings). DIABETES MEAL PLANNING WORKSHEET Your dietician can use this worksheet to help you decide how many servings of foods and what types of foods are right for you.  BREAKFAST Food Group and Servings / Carb Servings Grain/Starches __________________________________ Dairy __________________________________________ Vegetable ______________________________________ Fruit ___________________________________________ Meat __________________________________________ Fat ____________________________________________ LUNCH Food Group and Servings / Carb Servings Grain/Starches ___________________________________ Dairy ___________________________________________ Fruit ____________________________________________ Meat ___________________________________________ Fat _____________________________________________ Laural Golden Food Group and Servings / Carb Servings Grain/Starches  ___________________________________ Dairy ___________________________________________ Fruit ____________________________________________ Meat ___________________________________________ Fat _____________________________________________ SNACKS Food Group and Servings / Carb Servings Grain/Starches ___________________________________ Dairy ___________________________________________ Vegetable _______________________________________ Fruit ____________________________________________ Meat ___________________________________________ Fat _____________________________________________ DAILY TOTALS Starches _________________________ Vegetable ________________________ Fruit ____________________________ Dairy ____________________________ Meat ____________________________ Fat ______________________________ Document Released: 11/29/2004 Document Revised: 05/27/2011 Document Reviewed: 10/10/2008 Exit[employer] Patient Information 2013 [place], [place].

## 2012-07-08 NOTE — Progress Notes (Signed)
  Subjective:    Patient ID: ALBANA SAPERSTEIN, female    DOB: 01-20-1966, 47 y.o.   MRN: [id number]  HPIpatient relates that she has had a history of ADD also history of anxiety issues she even wondered at one time if she is bipolar she seen several different doctors along with the specialist and nothing is ever really been determined she denies any sweats chills nausea vomiting she denies cough wheezing she relates a non-healthy diet but she does do some exercise and try to keep her weight down. Past medical history also negative for any heart disease or diabetes  Patient does not smoke.  Review of Systems See above.    Objective:   Physical Exam  Lungs are clear hearts regular pulse normal BP good extremities no edema skin warm dry moderate obesity      Assessment & Plan:  Weight loss discussed plus proper eating and exercise ADD-AdderallXR 20 mg 2 each morning she will call back to see if this dose is doing well if so where issue her more medication I would recommend following up in 3-4 months Lab work recommended She's not sure if Effexor is helping her eye she is hard he tried Zoloft and several other medications my feeling is if Effexor not helping her then the next step would be is considering tapering off Effexor. He really doesn't sound like she has true depression saw him not strong about putting her on additional medicine if this one doesn't help. She will let us know.

## 2012-07-10 ENCOUNTER — Telehealth: Payer: Self-pay | Admitting: Family Medicine

## 2012-07-10 ENCOUNTER — Other Ambulatory Visit: Payer: Self-pay

## 2012-07-10 LAB — T4, FREE: Free T4: 1.12 ng/dL (ref 0.80–1.80)

## 2012-07-10 LAB — LIPID PANEL
Cholesterol: 176 mg/dL (ref 0–200)
HDL: 50 mg/dL (ref >39)
LDL Cholesterol: 113 mg/dL — ABNORMAL HIGH (ref 0–99)
Total CHOL/HDL Ratio: 3.5 Ratio
Triglycerides: 67 mg/dL (ref <150)
VLDL: 13 mg/dL (ref 0–40)

## 2012-07-10 LAB — GLUCOSE, RANDOM: Glucose, Bld: 93 mg/dL (ref 70–99)

## 2012-07-10 LAB — TSH: TSH: 4.748 u[IU]/mL — ABNORMAL HIGH (ref 0.350–4.500)

## 2012-07-10 NOTE — Telephone Encounter (Signed)
Seen Dr. Lilyan Punt as a new patient on Wednesday July 08, 2012 as a new patient.  Medication sent into Pharmacy wrong.  Please call Patient as soon as possible.  No Paper Chart due to new patient status and electronic records.  Thanks

## 2012-07-10 NOTE — Telephone Encounter (Signed)
Medication corrected with pharmacy. Left message on voicemail notifying patient.

## 2012-07-13 ENCOUNTER — Other Ambulatory Visit: Payer: Self-pay | Admitting: *Deleted

## 2012-07-13 DIAGNOSIS — E039 Hypothyroidism, unspecified: Secondary | ICD-10-CM

## 2012-07-13 NOTE — Progress Notes (Signed)
Results given to pt

## 2012-07-24 ENCOUNTER — Telehealth: Payer: Self-pay | Admitting: *Deleted

## 2012-07-24 NOTE — Telephone Encounter (Signed)
Patient notified. Patient stated she would stick with this dose and didn't want referral at this time.

## 2012-07-24 NOTE — Telephone Encounter (Signed)
Patient would like the dosage of her amphetamine salts increased.

## 2012-07-24 NOTE — Telephone Encounter (Signed)
Per Dr. Lorin Picket- This is the highest dose we can prescribe if patient feels like she needs increase in dose she will need to see psychiatrist.

## 2012-07-28 ENCOUNTER — Other Ambulatory Visit: Payer: Self-pay | Admitting: Obstetrics & Gynecology

## 2012-07-28 DIAGNOSIS — Z1231 Encounter for screening mammogram for malignant neoplasm of breast: Secondary | ICD-10-CM

## 2012-08-06 ENCOUNTER — Telehealth: Payer: Self-pay | Admitting: *Deleted

## 2012-08-06 ENCOUNTER — Other Ambulatory Visit: Payer: Self-pay | Admitting: Family Medicine

## 2012-08-06 MED ORDER — AMPHETAMINE-DEXTROAMPHET ER 20 MG PO CP24: ORAL_CAPSULE | ORAL | Status: DC

## 2012-08-06 NOTE — Telephone Encounter (Signed)
Left message on vm of RX at front desk to be signed and picked up

## 2012-08-06 NOTE — Telephone Encounter (Signed)
2 scripts written follow up by  7 weeks (before these run out)

## 2012-08-06 NOTE — Telephone Encounter (Signed)
NO CHART---NEW PATIENT after EPIC----Patient needs a prescription for Adderall.  She has 3 days worth left.  Please call patient when this is ready to be picked up.

## 2012-08-07 ENCOUNTER — Telehealth: Payer: Self-pay | Admitting: Family Medicine

## 2012-08-07 NOTE — Telephone Encounter (Signed)
Verify dose and prescribe 4 refills

## 2012-08-07 NOTE — Telephone Encounter (Signed)
Called in 4 refills of Lyrica 75 mg 2 tablets daily to [facility]. Patient notified.

## 2012-08-07 NOTE — Telephone Encounter (Signed)
Patient needs a refill of lyrica to [facility]. She was prescribed this by another doctor. But this helps with her legs now that she is exercising.

## 2012-08-11 ENCOUNTER — Telehealth: Payer: Self-pay | Admitting: Family Medicine

## 2012-08-11 ENCOUNTER — Other Ambulatory Visit: Payer: Self-pay | Admitting: Family Medicine

## 2012-08-11 DIAGNOSIS — B353 Tinea pedis: Secondary | ICD-10-CM

## 2012-08-11 NOTE — Telephone Encounter (Signed)
Patient needs a referral for her toe fungus to her podiatrist Dr. Kaylyn Layer [phone]. He has seen her for this previously, but since she is on Medicaid she needs a referral.

## 2012-08-11 NOTE — Telephone Encounter (Signed)
Her toe is black, she says. She said someone stepped on it today and she urgently needs this referral so they can set up for her surgery. She is in a lot of pain.

## 2012-08-11 NOTE — Telephone Encounter (Signed)
First of all, I put in for the referral. But it takes R. Person often several days. If she is having significant problems the best thing for her to do is schedule on office visit to see [place]. It is much easier for [place] to expedite her care when we see her in person rather than via a bunch of phone calls.

## 2012-08-12 ENCOUNTER — Telehealth: Payer: Self-pay | Admitting: *Deleted

## 2012-08-12 ENCOUNTER — Encounter: Payer: Self-pay | Admitting: Family Medicine

## 2012-08-12 ENCOUNTER — Ambulatory Visit (INDEPENDENT_AMBULATORY_CARE_PROVIDER_SITE_OTHER): Payer: Medicaid Other | Admitting: Family Medicine

## 2012-08-12 VITALS — BP 132/84 | Temp 98.4°F | Ht 69.0 in | Wt 286.0 lb

## 2012-08-12 DIAGNOSIS — B351 Tinea unguium: Secondary | ICD-10-CM

## 2012-08-12 MED ORDER — CLINDAMYCIN HCL 300 MG PO CAPS: 300.0000 mg | ORAL_CAPSULE | Freq: Three times a day (TID) | ORAL | Status: DC

## 2012-08-12 NOTE — Telephone Encounter (Signed)
Pt to make an app to be seen for toe

## 2012-08-12 NOTE — Progress Notes (Signed)
  Subjective:    Patient ID: Lauren Greene, female    DOB: 1965-05-03, 47 y.o.   MRN: [id number]  HPI Patient states that she is here today for a referral to podiatry. She has a toenail fungus/infection on both feet that has been present for many years.  Now having red painful r great toe, some drainage no fever no calf pain  Review of Systemssee above    dr Irving Burton Objective:   Physical Exam Calf normal Tender r great toe with subungal infection Also toenail fungus on several other and some foot deformity       Assessment & Plan:  Cellulitis- warm soaks, warnings discussed Ov with podiatry Dr.Tillis clindamycin

## 2012-08-12 NOTE — Telephone Encounter (Signed)
Left message to pt to call office. On 5/27 and 5/28 at vm at home and cell

## 2012-08-27 ENCOUNTER — Inpatient Hospital Stay (HOSPITAL_COMMUNITY): Payer: Medicaid Other | Admitting: Anesthesiology

## 2012-08-27 ENCOUNTER — Encounter (HOSPITAL_COMMUNITY)
Admission: EM | Disposition: A | Discharge status: Home Health | Payer: Self-pay | Source: Home / Self Care | Attending: Orthopaedic Surgery

## 2012-08-27 ENCOUNTER — Other Ambulatory Visit: Payer: Self-pay | Admitting: Physician Assistant

## 2012-08-27 ENCOUNTER — Inpatient Hospital Stay (HOSPITAL_COMMUNITY)
Admission: EM | Admit: 2012-08-27 | Discharge: 2012-08-30 | DRG: 481 | Disposition: A | Discharge status: Home Health | Payer: Medicaid Other | Attending: Orthopaedic Surgery | Admitting: Orthopaedic Surgery

## 2012-08-27 ENCOUNTER — Emergency Department (HOSPITAL_COMMUNITY): Payer: Medicaid Other

## 2012-08-27 ENCOUNTER — Inpatient Hospital Stay (HOSPITAL_COMMUNITY): Payer: Medicaid Other

## 2012-08-27 ENCOUNTER — Encounter (HOSPITAL_COMMUNITY): Payer: Self-pay | Admitting: *Deleted

## 2012-08-27 ENCOUNTER — Encounter (HOSPITAL_COMMUNITY): Payer: Self-pay | Admitting: Anesthesiology

## 2012-08-27 DIAGNOSIS — M9712XA Periprosthetic fracture around internal prosthetic left knee joint, initial encounter: Secondary | ICD-10-CM

## 2012-08-27 DIAGNOSIS — Z79899 Other long term (current) drug therapy: Secondary | ICD-10-CM

## 2012-08-27 DIAGNOSIS — W010XXA Fall on same level from slipping, tripping and stumbling without subsequent striking against object, initial encounter: Secondary | ICD-10-CM | POA: Diagnosis present

## 2012-08-27 DIAGNOSIS — S7292XA Unspecified fracture of left femur, initial encounter for closed fracture: Secondary | ICD-10-CM

## 2012-08-27 DIAGNOSIS — Z9660 Presence of unspecified orthopedic joint implant: Principal | ICD-10-CM | POA: Diagnosis present

## 2012-08-27 DIAGNOSIS — Z87891 Personal history of nicotine dependence: Secondary | ICD-10-CM

## 2012-08-27 DIAGNOSIS — Z6841 Body Mass Index (BMI) 40.0 and over, adult: Secondary | ICD-10-CM

## 2012-08-27 DIAGNOSIS — G43909 Migraine, unspecified, not intractable, without status migrainosus: Secondary | ICD-10-CM | POA: Diagnosis present

## 2012-08-27 DIAGNOSIS — T84049A Periprosthetic fracture around unspecified internal prosthetic joint, initial encounter: Principal | ICD-10-CM | POA: Diagnosis present

## 2012-08-27 DIAGNOSIS — S7290XA Unspecified fracture of unspecified femur, initial encounter for closed fracture: Secondary | ICD-10-CM

## 2012-08-27 DIAGNOSIS — Z96659 Presence of unspecified artificial knee joint: Secondary | ICD-10-CM

## 2012-08-27 HISTORY — PX: FEMUR IM NAIL: SHX1597

## 2012-08-27 HISTORY — DX: Periprosthetic fracture around internal prosthetic left knee joint, initial encounter: M97.12XA

## 2012-08-27 HISTORY — PX: IM NAILING FEMORAL SHAFT FRACTURE: SUR731

## 2012-08-27 HISTORY — DX: Unspecified fracture of unspecified femur, initial encounter for closed fracture: S72.90XA

## 2012-08-27 LAB — COMPREHENSIVE METABOLIC PANEL
ALT: 24 U/L (ref 0–35)
ALT: 22 U/L (ref 0–35)
AST: 27 U/L (ref 0–37)
AST: 24 U/L (ref 0–37)
Albumin: 4 g/dL (ref 3.5–5.2)
Albumin: 3.7 g/dL (ref 3.5–5.2)
Alkaline Phosphatase: 110 U/L (ref 39–117)
Alkaline Phosphatase: 95 U/L (ref 39–117)
BUN: 21 mg/dL (ref 6–23)
BUN: 19 mg/dL (ref 6–23)
CO2: 26 mEq/L (ref 19–32)
CO2: 28 mEq/L (ref 19–32)
Calcium: 8.9 mg/dL (ref 8.4–10.5)
Calcium: 9.1 mg/dL (ref 8.4–10.5)
Chloride: 104 mEq/L (ref 96–112)
Chloride: 108 mEq/L (ref 96–112)
Creatinine, Ser: 0.7 mg/dL (ref 0.50–1.10)
Creatinine, Ser: 0.6 mg/dL (ref 0.50–1.10)
GFR calc Af Amer: >90 mL/min (ref >90)
GFR calc Af Amer: >90 mL/min (ref >90)
GFR calc non Af Amer: >90 mL/min (ref >90)
GFR calc non Af Amer: >90 mL/min (ref >90)
Glucose, Bld: 112 mg/dL — ABNORMAL HIGH (ref 70–99)
Glucose, Bld: 90 mg/dL (ref 70–99)
Potassium: 3.7 mEq/L (ref 3.5–5.1)
Potassium: 4.7 mEq/L (ref 3.5–5.1)
Sodium: 140 mEq/L (ref 135–145)
Sodium: 141 mEq/L (ref 135–145)
Total Bilirubin: 0.3 mg/dL (ref 0.3–1.2)
Total Bilirubin: 0.4 mg/dL (ref 0.3–1.2)
Total Protein: 7 g/dL (ref 6.0–8.3)
Total Protein: 6.4 g/dL (ref 6.0–8.3)

## 2012-08-27 LAB — CBC WITH DIFFERENTIAL/PLATELET
Basophils Absolute: 0 10*3/uL (ref 0.0–0.1)
Basophils Relative: 0 % (ref 0–1)
Eosinophils Absolute: 0.1 10*3/uL (ref 0.0–0.7)
Eosinophils Relative: 1 % (ref 0–5)
HCT: 39.4 % (ref 36.0–46.0)
Hemoglobin: 13.1 g/dL (ref 12.0–15.0)
Lymphocytes Relative: 18 % (ref 12–46)
Lymphs Abs: 1.5 10*3/uL (ref 0.7–4.0)
MCH: 27.9 pg (ref 26.0–34.0)
MCHC: 33.2 g/dL (ref 30.0–36.0)
MCV: 83.8 fL (ref 78.0–100.0)
Monocytes Absolute: 0.5 10*3/uL (ref 0.1–1.0)
Monocytes Relative: 6 % (ref 3–12)
Neutro Abs: 6.4 10*3/uL (ref 1.7–7.7)
Neutrophils Relative %: 75 % (ref 43–77)
Platelets: 215 10*3/uL (ref 150–400)
RBC: 4.7 MIL/uL (ref 3.87–5.11)
RDW: 13.9 % (ref 11.5–15.5)
WBC: 8.5 10*3/uL (ref 4.0–10.5)

## 2012-08-27 LAB — CBC
HCT: 37.3 % (ref 36.0–46.0)
Hemoglobin: 12.5 g/dL (ref 12.0–15.0)
MCH: 27.9 pg (ref 26.0–34.0)
MCHC: 33.5 g/dL (ref 30.0–36.0)
MCV: 83.3 fL (ref 78.0–100.0)
Platelets: 187 10*3/uL (ref 150–400)
RBC: 4.48 MIL/uL (ref 3.87–5.11)
RDW: 14.2 % (ref 11.5–15.5)
WBC: 7.9 10*3/uL (ref 4.0–10.5)

## 2012-08-27 LAB — TYPE AND SCREEN
ABO/RH(D): B POS
Antibody Screen: NEGATIVE

## 2012-08-27 LAB — URINALYSIS, ROUTINE W REFLEX MICROSCOPIC
Glucose, UA: NEGATIVE mg/dL
Hgb urine dipstick: NEGATIVE
Ketones, ur: NEGATIVE mg/dL
Leukocytes, UA: NEGATIVE
Nitrite: NEGATIVE
Protein, ur: 30 mg/dL — AB
Specific Gravity, Urine: 1.039 — ABNORMAL HIGH (ref 1.005–1.030)
Urobilinogen, UA: 0.2 mg/dL (ref 0.0–1.0)
pH: 5.5 (ref 5.0–8.0)

## 2012-08-27 LAB — PROTIME-INR
INR: 0.96 (ref 0.00–1.49)
INR: 0.97 (ref 0.00–1.49)
Prothrombin Time: 12.7 seconds (ref 11.6–15.2)
Prothrombin Time: 12.8 seconds (ref 11.6–15.2)

## 2012-08-27 LAB — URINE MICROSCOPIC-ADD ON

## 2012-08-27 LAB — APTT: aPTT: 28 seconds (ref 24–37)

## 2012-08-27 LAB — SURGICAL PCR SCREEN
MRSA, PCR: NEGATIVE
Staphylococcus aureus: NEGATIVE

## 2012-08-27 SURGERY — INSERTION, INTRAMEDULLARY ROD, FEMUR, RETROGRADE
Anesthesia: General | Site: Leg Upper | Laterality: Left | Wound class: Clean

## 2012-08-27 MED ORDER — EPHEDRINE SULFATE 50 MG/ML IJ SOLN
INTRAMUSCULAR | Status: DC | PRN
  Administered 2012-08-27 (×2): 10 mg via INTRAVENOUS

## 2012-08-27 MED ORDER — LACTATED RINGERS IV SOLN
INTRAVENOUS | Status: DC | PRN
  Administered 2012-08-27 – 2012-08-28 (×3): via INTRAVENOUS

## 2012-08-27 MED ORDER — ONDANSETRON HCL 4 MG/2ML IJ SOLN
INTRAMUSCULAR | Status: DC | PRN
  Administered 2012-08-27: 4 mg via INTRAVENOUS

## 2012-08-27 MED ORDER — VANCOMYCIN HCL IN DEXTROSE 1-5 GM/200ML-% IV SOLN
1000.0000 mg | Freq: Once | INTRAVENOUS | Status: AC
  Administered 2012-08-27: 1000 mg via INTRAVENOUS
  Filled 2012-08-27: qty 200

## 2012-08-27 MED ORDER — HYDROMORPHONE HCL PF 1 MG/ML IJ SOLN
1.0000 mg | Freq: Once | INTRAMUSCULAR | Status: AC
  Administered 2012-08-27: 1 mg via INTRAVENOUS
  Filled 2012-08-27: qty 1

## 2012-08-27 MED ORDER — ROCURONIUM BROMIDE 100 MG/10ML IV SOLN
INTRAVENOUS | Status: DC | PRN
  Administered 2012-08-27 (×2): 25 mg via INTRAVENOUS

## 2012-08-27 MED ORDER — LIDOCAINE HCL (CARDIAC) 20 MG/ML IV SOLN
INTRAVENOUS | Status: DC | PRN
  Administered 2012-08-27: 100 mg via INTRAVENOUS

## 2012-08-27 MED ORDER — PROPOFOL 10 MG/ML IV BOLUS
INTRAVENOUS | Status: DC | PRN
  Administered 2012-08-27: 200 mg via INTRAVENOUS

## 2012-08-27 MED ORDER — FENTANYL CITRATE 0.05 MG/ML IJ SOLN
INTRAMUSCULAR | Status: DC | PRN
  Administered 2012-08-27: 100 ug via INTRAVENOUS
  Administered 2012-08-27: 150 ug via INTRAVENOUS

## 2012-08-27 MED ORDER — LIDOCAINE HCL 4 % MT SOLN
OROMUCOSAL | Status: DC | PRN
  Administered 2012-08-27: 4 mL via TOPICAL

## 2012-08-27 MED ORDER — HYDROMORPHONE HCL PF 1 MG/ML IJ SOLN
1.0000 mg | INTRAMUSCULAR | Status: DC | PRN
  Administered 2012-08-27 (×2): 1 mg via INTRAVENOUS
  Filled 2012-08-27 (×2): qty 1

## 2012-08-27 MED ORDER — METHOCARBAMOL 500 MG PO TABS
500.0000 mg | ORAL_TABLET | Freq: Four times a day (QID) | ORAL | Status: DC | PRN
  Administered 2012-08-29 (×2): 500 mg via ORAL
  Filled 2012-08-27 (×5): qty 1

## 2012-08-27 MED ORDER — ONDANSETRON HCL 4 MG/2ML IJ SOLN
4.0000 mg | Freq: Three times a day (TID) | INTRAMUSCULAR | Status: AC | PRN
  Administered 2012-08-27: 4 mg via INTRAVENOUS
  Filled 2012-08-27: qty 2

## 2012-08-27 MED ORDER — POTASSIUM CHLORIDE IN NACL 20-0.9 MEQ/L-% IV SOLN
INTRAVENOUS | Status: DC
  Filled 2012-08-27 (×4): qty 1000

## 2012-08-27 MED ORDER — METHOCARBAMOL 100 MG/ML IJ SOLN
500.0000 mg | Freq: Four times a day (QID) | INTRAVENOUS | Status: DC | PRN
  Filled 2012-08-27: qty 5

## 2012-08-27 MED ORDER — SUCCINYLCHOLINE CHLORIDE 20 MG/ML IJ SOLN
INTRAMUSCULAR | Status: DC | PRN
  Administered 2012-08-27: 120 mg via INTRAVENOUS

## 2012-08-27 MED ORDER — FLUORESCEIN SODIUM 1 MG OP STRP
ORAL_STRIP | OPHTHALMIC | Status: AC
  Filled 2012-08-27: qty 1

## 2012-08-27 MED ORDER — HYDROMORPHONE HCL PF 1 MG/ML IJ SOLN
1.0000 mg | INTRAMUSCULAR | Status: DC | PRN
  Administered 2012-08-27 – 2012-08-28 (×4): 1 mg via INTRAVENOUS
  Filled 2012-08-27 (×3): qty 1

## 2012-08-27 MED ORDER — ONDANSETRON HCL 4 MG/2ML IJ SOLN
4.0000 mg | Freq: Once | INTRAMUSCULAR | Status: AC
  Administered 2012-08-27: 4 mg via INTRAVENOUS
  Filled 2012-08-27: qty 2

## 2012-08-27 MED ORDER — SODIUM CHLORIDE 0.9 % IV SOLN
INTRAVENOUS | Status: AC
  Administered 2012-08-27: 1000 mL via INTRAVENOUS

## 2012-08-27 MED ORDER — MIDAZOLAM HCL 5 MG/5ML IJ SOLN
INTRAMUSCULAR | Status: DC | PRN
  Administered 2012-08-27: 2 mg via INTRAVENOUS

## 2012-08-27 MED ORDER — OXYCODONE HCL 5 MG PO TABS
5.0000 mg | ORAL_TABLET | ORAL | Status: DC | PRN
  Administered 2012-08-29: 10 mg via ORAL
  Administered 2012-08-29: 5 mg via ORAL
  Administered 2012-08-30: 10 mg via ORAL
  Filled 2012-08-27 (×3): qty 2

## 2012-08-27 SURGICAL SUPPLY — 69 items
BANDAGE ELASTIC 4 VELCRO ST LF (GAUZE/BANDAGES/DRESSINGS) IMPLANT
BANDAGE ELASTIC 6 VELCRO ST LF (GAUZE/BANDAGES/DRESSINGS) ×2 IMPLANT
BANDAGE ESMARK 6X9 LF (GAUZE/BANDAGES/DRESSINGS) IMPLANT
BANDAGE GAUZE ELAST BULKY 4 IN (GAUZE/BANDAGES/DRESSINGS) IMPLANT
BIT DRILL CALIBRATED 4.3MMX365 (DRILL) ×1 IMPLANT
BIT DRILL CROWE PNT TWST 4.5MM (DRILL) ×1 IMPLANT
BLADE SURG 15 STRL LF DISP TIS (BLADE) ×1 IMPLANT
BLADE SURG 15 STRL SS (BLADE) ×1
BLADE SURG ROTATE 9660 (MISCELLANEOUS) IMPLANT
BNDG COHESIVE 6X5 TAN STRL LF (GAUZE/BANDAGES/DRESSINGS) IMPLANT
BNDG ESMARK 6X9 LF (GAUZE/BANDAGES/DRESSINGS)
CLOTH BEACON ORANGE TIMEOUT ST (SAFETY) ×2 IMPLANT
CUFF TOURNIQUET SINGLE 34IN LL (TOURNIQUET CUFF) IMPLANT
CUFF TOURNIQUET SINGLE 44IN (TOURNIQUET CUFF) IMPLANT
DRAPE C-ARM 42X72 X-RAY (DRAPES) ×2 IMPLANT
DRAPE C-ARMOR (DRAPES) ×2 IMPLANT
DRAPE INCISE IOBAN 66X45 STRL (DRAPES) IMPLANT
DRAPE ORTHO SPLIT 77X108 STRL (DRAPES) ×2
DRAPE PROXIMA HALF (DRAPES) ×6 IMPLANT
DRAPE SURG 17X23 STRL (DRAPES) ×2 IMPLANT
DRAPE SURG ORHT 6 SPLT 77X108 (DRAPES) ×2 IMPLANT
DRAPE U-SHAPE 47X51 STRL (DRAPES) ×2 IMPLANT
DRESSING OPSITE X SMALL 2X3 (GAUZE/BANDAGES/DRESSINGS) IMPLANT
DRILL CALIBRATED 4.3MMX365 (DRILL) ×2
DRILL CROWE POINT TWIST 4.5MM (DRILL) ×2
DRSG MEPILEX BORDER 4X4 (GAUZE/BANDAGES/DRESSINGS) ×2 IMPLANT
DRSG PAD ABDOMINAL 8X10 ST (GAUZE/BANDAGES/DRESSINGS) ×2 IMPLANT
DURAPREP 26ML APPLICATOR (WOUND CARE) ×4 IMPLANT
ELECT REM PT RETURN 9FT ADLT (ELECTROSURGICAL) ×2
ELECTRODE REM PT RTRN 9FT ADLT (ELECTROSURGICAL) ×1 IMPLANT
GAUZE XEROFORM 1X8 LF (GAUZE/BANDAGES/DRESSINGS) ×2 IMPLANT
GLOVE BIO SURGEON STRL SZ7.5 (GLOVE) ×6 IMPLANT
GLOVE BIO SURGEON STRL SZ8 (GLOVE) ×4 IMPLANT
GLOVE BIOGEL PI IND STRL 8 (GLOVE) ×3 IMPLANT
GLOVE BIOGEL PI INDICATOR 8 (GLOVE) ×3
GLOVE ORTHO TXT STRL SZ7.5 (GLOVE) ×6 IMPLANT
GOWN PREVENTION PLUS LG XLONG (DISPOSABLE) IMPLANT
GOWN PREVENTION PLUS XLARGE (GOWN DISPOSABLE) ×6 IMPLANT
GOWN STRL NON-REIN LRG LVL3 (GOWN DISPOSABLE) ×6 IMPLANT
GUIDEPIN 3.2X17.5 THRD DISP (PIN) ×2 IMPLANT
GUIDEWIRE BEAD TIP (WIRE) ×2 IMPLANT
KIT BASIN OR (CUSTOM PROCEDURE TRAY) ×2 IMPLANT
KIT ROOM TURNOVER OR (KITS) ×2 IMPLANT
MANIFOLD NEPTUNE II (INSTRUMENTS) ×2 IMPLANT
NAIL FEM RETRO 10.5X380 (Nail) ×2 IMPLANT
NAIL FEM RETRO 10.5X400 (Nail) ×2 IMPLANT
NEEDLE 22X1 1/2 (OR ONLY) (NEEDLE) ×2 IMPLANT
NS IRRIG 1000ML POUR BTL (IV SOLUTION) ×2 IMPLANT
PACK GENERAL/GYN (CUSTOM PROCEDURE TRAY) ×2 IMPLANT
PAD ARMBOARD 7.5X6 YLW CONV (MISCELLANEOUS) ×4 IMPLANT
PAD CAST 4YDX4 CTTN HI CHSV (CAST SUPPLIES) IMPLANT
PADDING CAST COTTON 4X4 STRL (CAST SUPPLIES)
PADDING CAST COTTON 6X4 STRL (CAST SUPPLIES) ×2 IMPLANT
SCREW CORT TI DBL LEAD 5X34 (Screw) ×2 IMPLANT
SCREW CORT TI DBL LEAD 5X60 (Screw) ×2 IMPLANT
SCREW CORT TI DBL LEAD 5X70 (Screw) ×2 IMPLANT
SCREW CORT TI DBL LEAD 5X80 (Screw) ×2 IMPLANT
SPONGE GAUZE 4X4 12PLY (GAUZE/BANDAGES/DRESSINGS) ×2 IMPLANT
SPONGE LAP 18X18 X RAY DECT (DISPOSABLE) ×2 IMPLANT
STAPLER VISISTAT 35W (STAPLE) IMPLANT
STOCKINETTE IMPERVIOUS LG (DRAPES) ×2 IMPLANT
SUT ETHILON 3 0 PS 1 (SUTURE) ×8 IMPLANT
SUT VIC AB 0 CTB1 27 (SUTURE) ×2 IMPLANT
SUT VIC AB 2-0 CT1 27 (SUTURE) ×3
SUT VIC AB 2-0 CT1 TAPERPNT 27 (SUTURE) ×3 IMPLANT
SYR CONTROL 10ML LL (SYRINGE) ×2 IMPLANT
TOWEL OR 17X24 6PK STRL BLUE (TOWEL DISPOSABLE) ×2 IMPLANT
TOWEL OR 17X26 10 PK STRL BLUE (TOWEL DISPOSABLE) ×2 IMPLANT
WATER STERILE IRR 1000ML POUR (IV SOLUTION) IMPLANT

## 2012-08-27 NOTE — H&P (Signed)
Lauren Greene is an 47 y.o. female.   Chief Complaint:   Left thigh pain with known fracture HPI:   47 yo female who sustained a mechanical fall this am directly on her left thigh/knee.  Was taken to [facility] and found to have a left femur fracture.  The fracture is just proximal to her left total knee replacement.  The TKA was placed by Dr. Thurston Hole who is unavailable and I have been asked to assume care.  The patient is fine with this.  She reports significant pain at her left knee.  She understands fully the need to proceed with surgery given the nature of this injury.  The risks are acute blood loss, nerve and vessel injury, DVT, and infection as well as damage to her total knee.  Past Medical History  Diagnosis Date  . Arthritis   . Migraine   . Back pain     Past Surgical History  Procedure Laterality Date  . Total knee arthroplasty      x2  . Heel spur surgery    . Back surgery      Family History  Problem Relation Age of Onset  . Cancer Mother   . Heart failure Father   . Cancer Father   . Cancer Other   . Heart failure Other    Social History:  reports that she quit smoking about 14 years ago. Her smoking use included Cigarettes. She smoked 0.00 packs per day for 10 years. She has never used smokeless tobacco. She reports that she does not drink alcohol or use illicit drugs.  Allergies:  Allergies  Allergen Reactions  . Bee Venom Anaphylaxis    Medications Prior to Admission  Medication Sig Dispense Refill  . amphetamine-dextroamphetamine (ADDERALL XR) 20 MG 24 hr capsule Take 40 mg by mouth 4 (four) times daily - after meals and at bedtime. 2 qam      . Diclofenac Sodium CR 100 MG 24 hr tablet Take 100 mg by mouth 2 (two) times daily. Take 2 tablets daily      . Lansoprazole (PREVACID PO) Take 1 tablet by mouth daily as needed (heartburn).      . pregabalin (LYRICA) 50 MG capsule Take 75 mg by mouth 2 (two) times daily.       Marland Kitchen venlafaxine XR  (EFFEXOR-XR) 150 MG 24 hr capsule Take 150 mg by mouth daily.        Results for orders placed during the hospital encounter of 08/27/12 (from the past 48 hour(s))  CBC WITH DIFFERENTIAL     Status: None   Collection Time    08/27/12  1:18 PM      Result Value Range   WBC 8.5  4.0 - 10.5 K/uL   RBC 4.70  3.87 - 5.11 MIL/uL   Hemoglobin 13.1  12.0 - 15.0 g/dL   HCT 11.9  14.7 - 82.9 %   MCV 83.8  78.0 - 100.0 fL   MCH 27.9  26.0 - 34.0 pg   MCHC 33.2  30.0 - 36.0 g/dL   RDW 56.2  13.0 - 86.5 %   Platelets 215  150 - 400 K/uL   Neutrophils Relative % 75  43 - 77 %   Neutro Abs 6.4  1.7 - 7.7 K/uL   Lymphocytes Relative 18  12 - 46 %   Lymphs Abs 1.5  0.7 - 4.0 K/uL   Monocytes Relative 6  3 - 12 %   Monocytes  Absolute 0.5  0.1 - 1.0 K/uL   Eosinophils Relative 1  0 - 5 %   Eosinophils Absolute 0.1  0.0 - 0.7 K/uL   Basophils Relative 0  0 - 1 %   Basophils Absolute 0.0  0.0 - 0.1 K/uL  COMPREHENSIVE METABOLIC PANEL     Status: Abnormal   Collection Time    08/27/12  1:18 PM      Result Value Range   Sodium 140  135 - 145 mEq/L   Potassium 3.7  3.5 - 5.1 mEq/L   Chloride 104  96 - 112 mEq/L   CO2 26  19 - 32 mEq/L   Glucose, Bld 112 (*) 70 - 99 mg/dL   BUN 21  6 - 23 mg/dL   Creatinine, Ser 5.62  0.50 - 1.10 mg/dL   Calcium 8.9  8.4 - 13.0 mg/dL   Total Protein 7.0  6.0 - 8.3 g/dL   Albumin 4.0  3.5 - 5.2 g/dL   AST 27  0 - 37 U/L   ALT 24  0 - 35 U/L   Alkaline Phosphatase 110  39 - 117 U/L   Total Bilirubin 0.3  0.3 - 1.2 mg/dL   GFR calc non Af Amer >90  >90 mL/min   GFR calc Af Amer >90  >90 mL/min   Comment:            The eGFR has been calculated     using the CKD EPI equation.     This calculation has not been     validated in all clinical     situations.     eGFR's persistently     <90 mL/min signify     possible Chronic Kidney Disease.  PROTIME-INR     Status: None   Collection Time    08/27/12  1:18 PM      Result Value Range   Prothrombin Time 12.7   11.6 - 15.2 seconds   INR 0.96  0.00 - 1.49   Dg Knee Complete 4 Views Left  08/27/2012   *RADIOLOGY REPORT*  Clinical Data: Fall with knee pain.  LEFT KNEE - COMPLETE 4+ VIEW  Comparison: None  Findings: Left total knee replacement identified without hardware complicating features.  A mildly comminuted oblique fracture of the distal femoral diaphysis is identified with 1 cm posterior and lateral displacement. There is no evidence of subluxation or dislocation. No focal bony lesions are present.  IMPRESSION: Mildly comminuted oblique distal femoral fracture as described.   Original Report Authenticated By: Harmon Pier, M.D.    Review of Systems  All other systems reviewed and are negative.    Blood pressure 132/52, pulse 58, temperature 98 F (36.7 C), temperature source Oral, resp. rate 17, SpO2 98.00%. Physical Exam  Constitutional: She is oriented to person, place, and time. She appears well-developed and well-nourished.  HENT:  Head: Normocephalic and atraumatic.  Eyes: EOM are normal. Pupils are equal, round, and reactive to light.  Neck: Normal range of motion. Neck supple.  Cardiovascular: Normal rate and regular rhythm.   Respiratory: Effort normal and breath sounds normal.  GI: Soft. Bowel sounds are normal.  Musculoskeletal:       Left upper leg: She exhibits bony tenderness, swelling and deformity.  Neurological: She is alert and oriented to person, place, and time.  Skin: Skin is warm and dry.  Psychiatric: She has a normal mood and affect.     Assessment/Plan Left periprosthetic femur fracture (around a  total knee arthroplasty) 1)  To the OR tonight for placement of a retrograde intramedullary nail to stabilize the fracture.  She will be admitted as an inpatient and will be non-weight bearing on that left leg following surgery.  She will need PT/OT as well in the post-operative period.  Kathryne Hitch 08/27/2012, 7:15 PM

## 2012-08-27 NOTE — Preoperative (Addendum)
Beta Blockers   Reason not to administer Beta Blockers:Not Applicable 

## 2012-08-27 NOTE — Anesthesia Preprocedure Evaluation (Addendum)
Anesthesia Evaluation  Patient identified by MRN, date of birth, ID band Patient awake    Reviewed: Allergy & Precautions, H&P , NPO status , Patient's Chart, lab work & pertinent test results  Airway Mallampati: II TM Distance: >3 FB Neck ROM: Full    Dental  (+) Teeth Intact and Dental Advisory Given   Pulmonary former smoker,  breath sounds clear to auscultation        Cardiovascular Rhythm:Regular Rate:Normal     Neuro/Psych  Headaches, PSYCHIATRIC DISORDERS    GI/Hepatic   Endo/Other  Morbid obesity  Renal/GU      Musculoskeletal   Abdominal   Peds  Hematology   Anesthesia Other Findings   Reproductive/Obstetrics                          Anesthesia Physical Anesthesia Plan  ASA: II  Anesthesia Plan: General   Post-op Pain Management:    Induction: Intravenous  Airway Management Planned: Oral ETT  Additional Equipment:   Intra-op Plan:   Post-operative Plan: Extubation in OR  Informed Consent: I have reviewed the patients History and Physical, chart, labs and discussed the procedure including the risks, benefits and alternatives for the proposed anesthesia with the patient or authorized representative who has indicated his/her understanding and acceptance.   Dental advisory given  Plan Discussed with: CRNA, Anesthesiologist and Surgeon  Anesthesia Plan Comments:         Anesthesia Quick Evaluation

## 2012-08-27 NOTE — Anesthesia Procedure Notes (Signed)
Procedure Name: Intubation Date/Time: 08/27/2012 10:16 PM Performed by: Ronel Rodeheaver S Pre-anesthesia Checklist: Patient identified, Emergency Drugs available, Suction available, Patient being monitored and Timeout performed Patient Re-evaluated:Patient Re-evaluated prior to inductionOxygen Delivery Method: Circle system utilized Preoxygenation: Pre-oxygenation with 100% oxygen Intubation Type: IV induction Ventilation: Mask ventilation without difficulty Laryngoscope Size: Mac and 4 Grade View: Grade II Tube type: Oral Tube size: 7.5 mm Number of attempts: 1 Airway Equipment and Method: Stylet Placement Confirmation: ETT inserted through vocal cords under direct vision,  positive ETCO2 and breath sounds checked- equal and bilateral Secured at: 21 cm Tube secured with: Tape Dental Injury: Teeth and Oropharynx as per pre-operative assessment

## 2012-08-27 NOTE — ED Notes (Addendum)
Left knee pain after slipping in mud. Pt states she is unable to move her knee. Pt had two percocet's PTA. CSM intact.

## 2012-08-27 NOTE — ED Notes (Signed)
Pt has had bilateral knee replacements

## 2012-08-27 NOTE — ED Provider Notes (Signed)
History     CSN: [id number]  Arrival date & time 08/27/12  1218   First MD Initiated Contact with Patient 08/27/12 1256      Chief Complaint  Patient presents with  . Knee Pain    (Consider location/radiation/quality/duration/timing/severity/associated sxs/prior treatment) HPI Comments: Patient was walking in the mud and slipped and fell on her left leg buckled behind her. She immediately and swelling to her right thigh. She's a history of bilateral knee replacements by Dr. Thurston Hole. Denies hitting her head or losing consciousness. Headache dizziness, lightheadedness, chest pain or shortness of breath. She is unable to bear weight on her left leg and unable to move her knee. There no open wounds. She denies any other injury. She has no neck, head, back, chest or abdominal pain.  The history is provided by the patient and the EMS personnel.    Past Medical History  Diagnosis Date  . Arthritis   . Migraine   . Back pain     Past Surgical History  Procedure Laterality Date  . Total knee arthroplasty      x2  . Heel spur surgery    . Back surgery      Family History  Problem Relation Age of Onset  . Cancer Mother   . Heart failure Father   . Cancer Father   . Cancer Other   . Heart failure Other     History  Substance Use Topics  . Smoking status: Former Smoker -- 10 years    Types: Cigarettes    Quit date: 03/18/1998  . Smokeless tobacco: Never Used  . Alcohol Use: No    OB History   Grav Para Term Preterm Abortions TAB SAB Ect Mult Living   3 3 3       3       Review of Systems  Constitutional: Negative for fever, activity change and appetite change.  HENT: Negative for congestion and rhinorrhea.   Respiratory: Negative for cough, chest tightness and shortness of breath.   Cardiovascular: Negative for chest pain.  Gastrointestinal: Negative for nausea, vomiting and abdominal pain.  Genitourinary: Negative for dysuria and hematuria.  Musculoskeletal:  Positive for myalgias, joint swelling and arthralgias. Negative for back pain.  Skin: Negative for rash.  Neurological: Negative for dizziness, weakness and headaches.  A complete 10 system review of systems was obtained and all systems are negative except as noted in the HPI and PMH.    Allergies  Bee venom  Home Medications   Current Outpatient Rx  Name  Route  Sig  Dispense  Refill  . amphetamine-dextroamphetamine (ADDERALL XR) 20 MG 24 hr capsule   Oral   Take 40 mg by mouth 4 (four) times daily - after meals and at bedtime. 2 qam         . Diclofenac Sodium CR 100 MG 24 hr tablet   Oral   Take 100 mg by mouth 2 (two) times daily. Take 2 tablets daily         . Lansoprazole (PREVACID PO)   Oral   Take 1 tablet by mouth daily as needed (heartburn).         . pregabalin (LYRICA) 50 MG capsule   Oral   Take 75 mg by mouth 2 (two) times daily.          Marland Kitchen venlafaxine XR (EFFEXOR-XR) 150 MG 24 hr capsule   Oral   Take 150 mg by mouth daily.  BP 113/52  Pulse 50  Temp(Src) 97.6 F (36.4 C) (Oral)  Resp 14  SpO2 93%  Physical Exam  Constitutional: She is oriented to person, place, and time. She appears well-developed and well-nourished. No distress.  HENT:  Head: Normocephalic and atraumatic.  Mouth/Throat: Oropharynx is clear and moist. No oropharyngeal exudate.  Eyes: Conjunctivae and EOM are normal. Pupils are equal, round, and reactive to light.  Neck: Normal range of motion. Neck supple.  Cardiovascular: Normal rate, regular rhythm and normal heart sounds.   No murmur heard. Pulmonary/Chest: Effort normal and breath sounds normal. No respiratory distress.  Abdominal: Soft. There is no tenderness. There is no rebound and no guarding.  Musculoskeletal: She exhibits edema and tenderness.  L femur deformity.  Able to wiggle toes.  +2 DP and PT pulse  Neurological: She is alert and oriented to person, place, and time. No cranial nerve deficit.  She exhibits normal muscle tone. Coordination normal.  Skin: Skin is warm.    ED Course  Procedures (including critical care time)  Labs Reviewed  COMPREHENSIVE METABOLIC PANEL - Abnormal; Notable for the following:    Glucose, Bld 112 (*)    All other components within normal limits  CBC WITH DIFFERENTIAL  PROTIME-INR   Dg Knee Complete 4 Views Left  08/27/2012   *RADIOLOGY REPORT*  Clinical Data: Fall with knee pain.  LEFT KNEE - COMPLETE 4+ VIEW  Comparison: None  Findings: Left total knee replacement identified without hardware complicating features.  A mildly comminuted oblique fracture of the distal femoral diaphysis is identified with 1 cm posterior and lateral displacement. There is no evidence of subluxation or dislocation. No focal bony lesions are present.  IMPRESSION: Mildly comminuted oblique distal femoral fracture as described.   Original Report Authenticated By: Harmon Pier, M.D.     1. Femur fracture, left, closed, initial encounter       MDM  Mechanical fall with left thigh pain. Did not hit head or lose consciousness. No other injuries. History of bilateral knee replacements by Dr. Thurston Hole. Neurovascularly intact. X-ray shows comminuted distal femur fracture. Patient is not interested in seeing Dr. Hilda Lias.  Discussed with Dr. Valentina Gu PA who accepts patient to [facility] for repair later today by Dr. Magnus Ivan. Leg splinted in position of comfort. Patient stable for transfer.       Glynn Octave, MD 08/27/12 [phone]

## 2012-08-28 ENCOUNTER — Inpatient Hospital Stay (HOSPITAL_COMMUNITY): Payer: Medicaid Other

## 2012-08-28 ENCOUNTER — Encounter (HOSPITAL_COMMUNITY): Payer: Self-pay | Admitting: General Practice

## 2012-08-28 LAB — URINE CULTURE
Colony Count: NO GROWTH
Culture: NO GROWTH

## 2012-08-28 LAB — PROTIME-INR
INR: 1.06 (ref 0.00–1.49)
Prothrombin Time: 13.7 seconds (ref 11.6–15.2)

## 2012-08-28 MED ORDER — WARFARIN SODIUM 10 MG PO TABS
10.0000 mg | ORAL_TABLET | Freq: Once | ORAL | Status: AC
  Administered 2012-08-28: 10 mg via ORAL
  Filled 2012-08-28: qty 1

## 2012-08-28 MED ORDER — VENLAFAXINE HCL ER 150 MG PO CP24
150.0000 mg | ORAL_CAPSULE | Freq: Every day | ORAL | Status: DC
  Administered 2012-08-28 – 2012-08-30 (×3): 150 mg via ORAL
  Filled 2012-08-28 (×3): qty 1

## 2012-08-28 MED ORDER — POLYETHYLENE GLYCOL 3350 17 G PO PACK: 17.0000 g | PACK | Freq: Every day | ORAL | Status: DC | PRN

## 2012-08-28 MED ORDER — 0.9 % SODIUM CHLORIDE (POUR BTL) OPTIME
TOPICAL | Status: DC | PRN
  Administered 2012-08-27: 1000 mL

## 2012-08-28 MED ORDER — ZOLPIDEM TARTRATE 5 MG PO TABS: 5.0000 mg | ORAL_TABLET | Freq: Every evening | ORAL | Status: DC | PRN

## 2012-08-28 MED ORDER — MORPHINE SULFATE 2 MG/ML IJ SOLN: 1.0000 mg | INTRAMUSCULAR | Status: DC | PRN

## 2012-08-28 MED ORDER — HYDROMORPHONE HCL PF 1 MG/ML IJ SOLN
1.0000 mg | INTRAMUSCULAR | Status: AC | PRN
  Filled 2012-08-28: qty 1

## 2012-08-28 MED ORDER — HYDROCODONE-ACETAMINOPHEN 5-325 MG PO TABS
1.0000 | ORAL_TABLET | ORAL | Status: DC | PRN
Start: 2012-08-28 — End: 2012-08-30

## 2012-08-28 MED ORDER — METHOCARBAMOL 100 MG/ML IJ SOLN: 500.0000 mg | Freq: Four times a day (QID) | INTRAVENOUS | Status: DC | PRN

## 2012-08-28 MED ORDER — HYDROMORPHONE HCL PF 1 MG/ML IJ SOLN
1.0000 mg | INTRAMUSCULAR | Status: DC | PRN
Start: 2012-08-28 — End: 2012-08-30
  Administered 2012-08-29: 1 mg via INTRAVENOUS
  Filled 2012-08-28: qty 1

## 2012-08-28 MED ORDER — HYDROMORPHONE HCL PF 1 MG/ML IJ SOLN: 0.2500 mg | INTRAMUSCULAR | Status: DC | PRN

## 2012-08-28 MED ORDER — NEOSTIGMINE METHYLSULFATE 1 MG/ML IJ SOLN
INTRAMUSCULAR | Status: DC | PRN
  Administered 2012-08-28: 4 mg via INTRAVENOUS

## 2012-08-28 MED ORDER — ONDANSETRON HCL 4 MG PO TABS: 4.0000 mg | ORAL_TABLET | Freq: Four times a day (QID) | ORAL | Status: DC | PRN

## 2012-08-28 MED ORDER — METHOCARBAMOL 500 MG PO TABS
500.0000 mg | ORAL_TABLET | Freq: Four times a day (QID) | ORAL | Status: DC | PRN
  Administered 2012-08-28: 500 mg via ORAL

## 2012-08-28 MED ORDER — WARFARIN - PHARMACIST DOSING INPATIENT: Freq: Every day | Status: DC

## 2012-08-28 MED ORDER — OXYCODONE HCL 5 MG PO TABS: 5.0000 mg | ORAL_TABLET | Freq: Once | ORAL | Status: DC | PRN

## 2012-08-28 MED ORDER — COUMADIN BOOK
Freq: Once | Status: DC
  Filled 2012-08-28: qty 1

## 2012-08-28 MED ORDER — ONDANSETRON HCL 4 MG/2ML IJ SOLN: 4.0000 mg | Freq: Once | INTRAMUSCULAR | Status: DC | PRN

## 2012-08-28 MED ORDER — BISACODYL 5 MG PO TBEC: 5.0000 mg | DELAYED_RELEASE_TABLET | Freq: Every day | ORAL | Status: DC | PRN

## 2012-08-28 MED ORDER — PANTOPRAZOLE SODIUM 40 MG PO TBEC
40.0000 mg | DELAYED_RELEASE_TABLET | Freq: Every day | ORAL | Status: DC
  Administered 2012-08-29 – 2012-08-30 (×2): 40 mg via ORAL
  Filled 2012-08-28 (×2): qty 1

## 2012-08-28 MED ORDER — METOCLOPRAMIDE HCL 10 MG PO TABS: 5.0000 mg | ORAL_TABLET | Freq: Three times a day (TID) | ORAL | Status: DC | PRN

## 2012-08-28 MED ORDER — VANCOMYCIN HCL IN DEXTROSE 1-5 GM/200ML-% IV SOLN
1000.0000 mg | Freq: Two times a day (BID) | INTRAVENOUS | Status: AC
  Administered 2012-08-28: 1000 mg via INTRAVENOUS
  Filled 2012-08-28: qty 200

## 2012-08-28 MED ORDER — AMPHETAMINE-DEXTROAMPHET ER 10 MG PO CP24
40.0000 mg | ORAL_CAPSULE | Freq: Every day | ORAL | Status: DC
  Administered 2012-08-29: 40 mg via ORAL
  Filled 2012-08-28: qty 4

## 2012-08-28 MED ORDER — OXYCODONE HCL 5 MG PO TABS
5.0000 mg | ORAL_TABLET | ORAL | Status: DC | PRN
  Administered 2012-08-29: 10 mg via ORAL
  Filled 2012-08-28 (×2): qty 2

## 2012-08-28 MED ORDER — ONDANSETRON HCL 4 MG/2ML IJ SOLN: 4.0000 mg | Freq: Four times a day (QID) | INTRAMUSCULAR | Status: DC | PRN

## 2012-08-28 MED ORDER — GLYCOPYRROLATE 0.2 MG/ML IJ SOLN
INTRAMUSCULAR | Status: DC | PRN
  Administered 2012-08-28: 0.6 mg via INTRAVENOUS

## 2012-08-28 MED ORDER — AMPHETAMINE-DEXTROAMPHET ER 20 MG PO CP24: 40.0000 mg | ORAL_CAPSULE | Freq: Every day | ORAL | Status: DC

## 2012-08-28 MED ORDER — METOCLOPRAMIDE HCL 5 MG/ML IJ SOLN: 5.0000 mg | Freq: Three times a day (TID) | INTRAMUSCULAR | Status: DC | PRN

## 2012-08-28 MED ORDER — BUPIVACAINE HCL (PF) 0.5 % IJ SOLN
INTRAMUSCULAR | Status: DC | PRN
  Administered 2012-08-28: 50 mL

## 2012-08-28 MED ORDER — OXYCODONE HCL 5 MG/5ML PO SOLN: 5.0000 mg | Freq: Once | ORAL | Status: DC | PRN

## 2012-08-28 MED ORDER — WARFARIN VIDEO
Freq: Once | Status: AC
  Administered 2012-08-28: 17:00:00

## 2012-08-28 MED ORDER — PREGABALIN 50 MG PO CAPS
75.0000 mg | ORAL_CAPSULE | Freq: Two times a day (BID) | ORAL | Status: DC
  Administered 2012-08-28 – 2012-08-30 (×3): 75 mg via ORAL
  Filled 2012-08-28 (×4): qty 1

## 2012-08-28 MED ORDER — DIPHENHYDRAMINE HCL 12.5 MG/5ML PO ELIX: 12.5000 mg | ORAL_SOLUTION | ORAL | Status: DC | PRN

## 2012-08-28 MED ORDER — SODIUM CHLORIDE 0.9 % IV SOLN: INTRAVENOUS | Status: DC

## 2012-08-28 MED ORDER — DOCUSATE SODIUM 100 MG PO CAPS
100.0000 mg | ORAL_CAPSULE | Freq: Two times a day (BID) | ORAL | Status: DC
  Administered 2012-08-28 – 2012-08-30 (×5): 100 mg via ORAL
  Filled 2012-08-28 (×7): qty 1

## 2012-08-28 NOTE — Progress Notes (Signed)
ANTICOAGULATION CONSULT NOTE - Initial Consult  Pharmacy Consult for Coumadin Indication: VTE prophylaxis  Allergies  Allergen Reactions  . Bee Venom Anaphylaxis    Patient Measurements:    Vital Signs: Temp: 97.8 F (36.6 C) (06/13 0156) BP: 123/67 mmHg (06/13 0156) Pulse Rate: 57 (06/13 0156)  Labs:  Recent Labs  08/27/12 1318 08/27/12 1933  HGB 13.1 12.5  HCT 39.4 37.3  PLT 215 187  APTT  --  28  LABPROT 12.7 12.8  INR 0.96 0.97  CREATININE 0.70 0.60    The CrCl is unknown because both a height and weight (above a minimum accepted value) are required for this calculation.   Medical History: Past Medical History  Diagnosis Date  . Arthritis   . Migraine   . Back pain     Medications:  Prescriptions prior to admission  Medication Sig Dispense Refill  . amphetamine-dextroamphetamine (ADDERALL XR) 20 MG 24 hr capsule Take 40 mg by mouth 4 (four) times daily - after meals and at bedtime. 2 qam      . Diclofenac Sodium CR 100 MG 24 hr tablet Take 100 mg by mouth 2 (two) times daily. Take 2 tablets daily      . Lansoprazole (PREVACID PO) Take 1 tablet by mouth daily as needed (heartburn).      . pregabalin (LYRICA) 50 MG capsule Take 75 mg by mouth 2 (two) times daily.       Marland Kitchen venlafaxine XR (EFFEXOR-XR) 150 MG 24 hr capsule Take 150 mg by mouth daily.        Assessment: 47 y.o. female presents s/p mechanical fall. To OR 6/13 for L femur fracture - s/p IM femoral nailing. To start coumadin for VTE prophylaxis post-op. Baseline INR 0.97. Baseline CBC ok.  Goal of Therapy:  INR 2-3 Monitor platelets by anticoagulation protocol: Yes   Plan:  1. Daily INR 2. Coumadin 10mg  po today 3. Coumadin education book and video  Christoper Fabian, PharmD, BCPS Clinical pharmacist, pager [phone] 08/28/2012,2:35 AM

## 2012-08-28 NOTE — Progress Notes (Signed)
Subjective: 1 Day Post-Op Procedure(s) (LRB): INTRAMEDULLARY (IM) RETROGRADE FEMORAL NAILING (Left) Patient reports pain as moderate.    Objective: Vital signs in last 24 hours: Temp:  [97.5 F (36.4 C)-98 F (36.7 C)] 97.8 F (36.6 C) (06/13 0512) Pulse Rate:  [50-65] 65 (06/13 0512) Resp:  [14-20] 18 (06/13 0512) BP: (107-132)/(49-72) 109/57 mmHg (06/13 0512) SpO2:  [91 %-100 %] 91 % (06/13 0512)  Intake/Output from previous day: 06/12 0701 - 06/13 0700 In: 4940 [P.O.:240; I.V.:4700] Out: 975 [Urine:825; Blood:150] Intake/Output this shift:     Recent Labs  08/27/12 1318 08/27/12 1933  HGB 13.1 12.5    Recent Labs  08/27/12 1318 08/27/12 1933  WBC 8.5 7.9  RBC 4.70 4.48  HCT 39.4 37.3  PLT 215 187    Recent Labs  08/27/12 1318 08/27/12 1933  NA 140 141  K 3.7 4.7  CL 104 108  CO2 26 28  BUN 21 19  CREATININE 0.70 0.60  GLUCOSE 112* 90  CALCIUM 8.9 9.1    Recent Labs  08/27/12 1933 08/28/12 0528  INR 0.97 1.06    Sensation intact distally Intact pulses distally Dorsiflexion/Plantar flexion intact Incision: dressing C/D/I  Assessment/Plan: 1 Day Post-Op Procedure(s) (LRB): INTRAMEDULLARY (IM) RETROGRADE FEMORAL NAILING (Left) Up with therapy No weight on left leg. Coumadin  Lauren Greene Y 08/28/2012, 7:28 AM

## 2012-08-28 NOTE — Op Note (Signed)
NAMECHARLIENE, INOUE NO.:  [id number]  MEDICAL RECORD NO.:  [id number]  LOCATION:  [id number]                        FACILITY:  [facility]  PHYSICIAN:  Vanita Panda. Magnus Ivan, M.D.DATE OF BIRTH:  Jan 30, 1966  DATE OF PROCEDURE:  08/27/2012 DATE OF DISCHARGE:                              OPERATIVE REPORT   PREOPERATIVE DIAGNOSIS:  Left supracondylar periprosthetic femur fracture around total knee arthroplasty.  POSTOPERATIVE DIAGNOSIS:  Left supracondylar periprosthetic femur fracture around total knee arthroplasty.  PROCEDURE:  Retrograde intramedullary nail placement, left femur.  IMPLANTS:  Biomet 10 x 400 intramedullary nail with 1 proximal and 3 distal interlocks.  SURGEON:  Vanita Panda. Magnus Ivan, M.D.  ASSISTANT:  Kriste Basque, PA-C.  ANESTHESIA:  General.  ANTIBIOTICS:  1 g IV vancomycin.  BLOOD LOSS:  150 mL.  COMPLICATIONS:  None.  INDICATIONS:  Ms. Elbert is a 47 year old morbidly obese very nice female who sustained a mechanical fall today at home at 11 this morning. She had inability to ambulate and severe pain in her leg.  She has a total knee replacement that was done several years ago.  She was seen at [facility] and found to have a complex periprosthetic femur fracture just proximal to her total knee replacement in the supracondylar region.  She was then transported down to [facility] for definitive treatment so she was taken care of by a local orthopedic surgeon down here.  I talked to her about the surgery and the best chance for getting this to heal and recommended open reduction and internal fixation using intramedullary nail.  Discussed the risks and benefits of this including the risks of acute blood loss, anemia, DVT, and PE, as well as infection and loosening of the prosthesis as well as nonunion.  PROCEDURE DESCRIPTION:  After informed consent was obtained, appropriate left leg was marked, she was brought to the  operating room, placed supine on the flat [place] table.  General anesthesia was then obtained. Her left leg was prepped and draped from the thigh down to the ankle with DuraPrep and sterile drapes including sterile stockinette.  Time- out was called.  She was identified as correct patient, correct left knee and femur.  I then used a radiolucent bump under the knee, made an incision at the patellar tendon and inferior pole of the patella, and split the patella tendon.  I was able to then place a guide pin through the intercondylar area for knee replacement where the box was.  I then advanced this and used initiating reamer over the femoral canal.  I then placed a long guidewire and from this took a measurement and chose a 10 x 400 femoral nail.  First, though we did choose a 10 x 380 nail and once we passed this down I was having trouble getting this to pass further, we got caught up in the box and entered the nail strips in the guide, so I was able to get the nail out and at that point decided to choose a longer nail 10 x 400.  This was better length in retrospect. Once I got this down, I used the outrigger guide and placed 2 transverse screws through separate stab incisions  from lateral to medial and one from oblique lateral to oblique medial.  We did open up more proximally at the fracture site itself to try to get reduced this as we were having trouble getting our length and reduction due to the oblique nature of the fracture and it was difficult to reduce.  I got an acceptable alignment as possible.  I then made 2 separate stab incisions anteriorly and put our proximal interlock from anterior to posterior.  We then irrigated all wounds, especially the knee joint itself.  We closed the deep tissue in the knee joint with 0 Vicryl followed by 2-0 Vicryl in subcutaneous tissue, and 3-0 nylon in all skin incisions.  Xeroform and well-padded sterile dressing was applied and the knee was  placed in a knee immobilizer.  She was awakened, extubated, and taken to the recovery room in a stable condition.  All final counts were correct. There were no complications noted.  Of note, Kriste Basque, PA-C was present during the entire case and his presence was crucial especially with holding traction during a long arduous case.     Vanita Panda. Magnus Ivan, M.D.     CYB/MEDQ  D:  08/28/2012  T:  08/28/2012  Job:  [id number]

## 2012-08-28 NOTE — Evaluation (Signed)
Occupational Therapy Evaluation Patient Details Name: Lauren Greene MRN: [id number] DOB: [birth year] Today's Date: 08/28/2012 Time: 4098-1191 OT Time Calculation (min): 19 min  OT Assessment / Plan / Recommendation Clinical Impression    Pt. has history of bilateral TKAs and slipped in the mud and fell, sustaining fx of left femur. She underwent IM nailing and is now in Bledsoe brace. She needed alot of encouragement to participate. She presents with below problem list. Pt will benefit from acute OT to increase independence prior to d/c.  Pt. will need 24 hour assist at home upon Dc. If this is not available, may need to consider other DC dispostion.      OT Assessment  Patient needs continued OT Services    Follow Up Recommendations  Home health OT;Supervision/Assistance - 24 hour    Barriers to Discharge Decreased caregiver support    Equipment Recommendations  Other (comment) (TBD-family checking)    Recommendations for Other Services    Frequency  Min 2X/week    Precautions / Restrictions Precautions Precautions: Fall Required Braces or Orthoses: Other Brace/Splint Other Brace/Splint: bledsoe brace at all times Restrictions Weight Bearing Restrictions: Yes LLE Weight Bearing: Non weight bearing   Pertinent Vitals/Pain 10/10 pain in left leg. Repositioned. Increased activity. Pt had recently been given pain meds.     ADL  Eating/Feeding: Independent Where Assessed - Eating/Feeding: Chair Grooming: Set up;Brushing hair Where Assessed - Grooming: Unsupported sitting Upper Body Bathing: Set up Where Assessed - Upper Body Bathing: Supported sitting Lower Body Bathing: Maximal assistance Where Assessed - Lower Body Bathing: Supported sit to stand Upper Body Dressing: Set up Where Assessed - Upper Body Dressing: Supported sitting Lower Body Dressing: Maximal assistance Where Assessed - Lower Body Dressing: Supported sit to Pharmacist, hospital: Freight forwarder: Patient Percentage: 70% Statistician Method: Surveyor, minerals: Other (comment) (from elevated bed to recliner chair) Tub/Shower Transfer Method: Not assessed Equipment Used: Gait belt;Other (comment);Rolling walker (Bledsoe brace) Transfers/Ambulation Related to ADLs: +2 Total A for transfers ADL Comments: Pt at overall Max A level for LB ADLs with sit to stand transfer. Pt transferred from bed to chair.     OT Diagnosis: Acute pain  OT Problem List: Decreased activity tolerance;Impaired balance (sitting and/or standing);Decreased knowledge of use of DME or AE;Decreased knowledge of precautions;Pain OT Treatment Interventions: Self-care/ADL training;DME and/or AE instruction;Therapeutic activities;Patient/family education;Balance training   OT Goals Acute Rehab OT Goals OT Goal Formulation: With patient Time For Goal Achievement: 09/04/12 Potential to Achieve Goals: Good ADL Goals Pt Will Perform Grooming: Standing at sink;with supervision ADL Goal: Grooming - Progress: Goal set today Pt Will Perform Lower Body Bathing: Sit to stand from chair;with min assist ADL Goal: Lower Body Bathing - Progress: Goal set today Pt Will Perform Lower Body Dressing: Sit to stand from bed;Sit to stand from chair;with min assist ADL Goal: Lower Body Dressing - Progress: Goal set today Pt Will Transfer to Toilet: Ambulation;with DME;with supervision ADL Goal: Toilet Transfer - Progress: Goal set today Pt Will Perform Toileting - Clothing Manipulation: Standing;with supervision ADL Goal: Toileting - Clothing Manipulation - Progress: Goal set today Pt Will Perform Toileting - Hygiene: Sit to stand from 3-in-1/toilet;Sitting on 3-in-1 or toilet;with supervision ADL Goal: Toileting - Hygiene - Progress: Goal set today Pt Will Perform Tub/Shower Transfer: Tub transfer;with supervision;Ambulation;with DME ADL Goal: Tub/Shower Transfer - Progress: Goal set  today  Visit Information  Last OT Received On: 08/28/12 Assistance Needed: +2 PT/OT Co-Evaluation/Treatment:  Yes    Subjective Data      Prior Functioning     Home Living Lives With: Son;Other (Comment) (age 82 attends school) Available Help at Discharge: Available PRN/intermittently Type of Home: House Home Access: Stairs to enter [employer] of Steps: 5 Entrance Stairs-Rails: None Home Layout: Two level;Able to live on main level with bedroom/bathroom Bathroom Shower/Tub: Tub/shower unit;Other (comment) Bathroom Toilet: Standard Home Adaptive Equipment: None;Other (comment) (family to check) Prior Function Level of Independence: Independent Able to Take Stairs?: Yes Driving: Yes Vocation: Full time employment Communication Communication: No difficulties         Vision/Perception     Cognition  Cognition Arousal/Alertness: Awake/alert Behavior During Therapy: WFL for tasks assessed/performed Overall Cognitive Status: Within Functional Limits for tasks assessed    Extremity/Trunk Assessment Right Upper Extremity Assessment RUE ROM/Strength/Tone: WFL for tasks assessed RUE Sensation: WFL - Light Touch RUE Coordination: WFL - gross/fine motor Left Upper Extremity Assessment LUE ROM/Strength/Tone: WFL for tasks assessed LUE Sensation: WFL - Light Touch LUE Coordination: WFL - gross/fine motor     Mobility Bed Mobility Bed Mobility: Supine to Sit;Sitting - Scoot to Edge of Bed Supine to Sit: 3: Mod assist;HOB elevated;With rails Sitting - Scoot to Edge of Bed: 4: Min guard Sit to Supine: Not Tested (comment) Details for Bed Mobility Assistance: mod assist to manage L LE during transition.  Pt able to manage her upper body with HOB elevated.  cues for technique and sequence Transfers Transfers: Sit to Stand;Stand to Sit Sit to Stand: 1: +2 Total assist;With upper extremity assist;From bed;From elevated surface Sit to Stand: Patient Percentage:  70% Stand to Sit: 1: +2 Total assist;With upper extremity assist;With armrests;To chair/3-in-1 Stand to Sit: Patient Percentage: 70% Details for Transfer Assistance: cues for technique and for maintaining NWB status; assist to stabilize and balance     Exercise     Balance     End of Session OT - End of Session Equipment Utilized During Treatment: Gait belt;Other (comment) (Bledsoe brace) Activity Tolerance: Patient limited by pain Patient left: in chair;with call bell/phone within reach  [employer] OTR/L [phone] 08/28/2012, 4:32 PM

## 2012-08-28 NOTE — Brief Op Note (Signed)
08/27/2012 - 08/28/2012  1:04 AM  PATIENT:  Lauren Greene  47 y.o. female  PRE-OPERATIVE DIAGNOSIS:  left femur periprostetic fracture  POST-OPERATIVE DIAGNOSIS:  left femur periprostetic fracture  PROCEDURE:  Procedure(s): INTRAMEDULLARY (IM) RETROGRADE FEMORAL NAILING (Left)  SURGEON:  Surgeon(s) and Role:    * Kathryne Hitch, MD - Primary  PHYSICIAN ASSISTANT: Rexene Edison, PA-C  ANESTHESIA:   general  EBL:  Total I/O In: 2000 [I.V.:2000] Out: 375 [Urine:225; Blood:150]  BLOOD ADMINISTERED:none  DRAINS: none   LOCAL MEDICATIONS USED:  MARCAINE     SPECIMEN:  No Specimen  DISPOSITION OF SPECIMEN:  N/A  COUNTS:  YES  TOURNIQUET:  * No tourniquets in log *  DICTATION: .Other Dictation: Dictation Number [id number]  PLAN OF CARE: Admit to inpatient   PATIENT DISPOSITION:  PACU - hemodynamically stable.   Delay start of Pharmacological VTE agent (>24hrs) due to surgical blood loss or risk of bleeding: no

## 2012-08-28 NOTE — Care Management Note (Signed)
CARE MANAGEMENT NOTE 08/28/2012  Patient:  Lauren Greene, Lauren Greene   Account Number:  [id number]  Date Initiated:  08/28/2012  Documentation initiated by:  Vance Peper  Subjective/Objective Assessment:   47 yr old female s/p  Left femur IM Nailing.     Action/Plan:   CM spoke with patient and daughter concerning home health and DME needs. at discharge. patient has family support. Patient on Medeicaid, we will have to use [facility].   Anticipated DC Date:  08/30/2012   Anticipated DC Plan:  HOME W HOME HEALTH SERVICES      DC Planning Services  CM consult      [facility] Choice  HOME HEALTH  DURABLE MEDICAL EQUIPMENT   Choice offered to / List presented to:  C-1 Patient   DME arranged  [facility]  3-N-1      DME agency  [facility]     HH arranged  HH-2 PT  HH-1 RN      [facility] agency  [facility]   Status of service:  In process, will continue to follow Medicare Important Message given?   (If response is "NO", the following Medicare IM given date fields will be blank) Date Medicare IM given:   Date Additional Medicare IM given:    Discharge Disposition:  HOME W HOME HEALTH SERVICES  Per UR Regulation:    If discussed at Long Length of Stay Meetings, dates discussed:    Comments:

## 2012-08-28 NOTE — Evaluation (Signed)
Physical Therapy Evaluation Patient Details Name: Lauren Greene MRN: [id number] DOB: 02/03/1966 Today's Date: 08/28/2012 Time: 1432 (for interview portion this am, in at [phone] (for interview portion this am, out at 0857) PT Time Calculation (min): 23 min + 11 (total of 34 minutes)  PT Assessment / Plan / Recommendation Clinical Impression  Pt. has history of bilateral TKAs and slipped in the mud and fell, sustaining fx of left femur.  She underwent IM nailing and is now in Bledsoe brace.  She needed alot of encouragement to participate and was educated in risks of not getting OOB post operatively.  she was agreeable at that point.  She has significant decrease in her usual state of independence and mobility/gait, and will need acute PT to address these and below issues.  Pt. will need 24 hour assist at home upon Dc.  If this is not available, may need to consider other DC dispostion.  Goals written for home with 24 hour assist at this point in time.    PT Assessment  Patient needs continued PT services    Follow Up Recommendations  Home health PT;Supervision/Assistance - 24 hour;Supervision for mobility/OOB    Does the patient have the potential to tolerate intense rehabilitation      Barriers to Discharge Decreased caregiver support son lives with pt., age 88 and in school.  she has other children in the room now.    Equipment Recommendations  Rolling walker with 5" wheels    Recommendations for Other Services     Frequency Min 6X/week    Precautions / Restrictions Precautions Precautions: Fall Required Braces or Orthoses: Other Brace/Splint Other Brace/Splint: bledsoe brace at all times Restrictions Weight Bearing Restrictions: Yes LLE Weight Bearing: Non weight bearing   Pertinent Vitals/Pain See vitals tab       Mobility  Bed Mobility Bed Mobility: Supine to Sit;Sitting - Scoot to Edge of Bed;Sit to Supine Supine to Sit: 3: Mod assist;HOB elevated;With  rails Sitting - Scoot to Edge of Bed: 4: Min guard Sit to Supine: Not Tested (comment) Details for Bed Mobility Assistance: mod assist to manage L LE during transition.  Pt able to manage her upper body with HOB elevated.  cues for technique and sequence Transfers Transfers: Sit to Stand;Stand to Sit;Stand Pivot Transfers Sit to Stand: 1: +2 Total assist;With upper extremity assist;From bed;From elevated surface Sit to Stand: Patient Percentage: 70% Stand to Sit: 1: +2 Total assist;With upper extremity assist;With armrests;To chair/3-in-1 Stand to Sit: Patient Percentage: 70% Stand Pivot Transfers: 1: +2 Total assist Stand Pivot Transfers: Patient Percentage: 70% Details for Transfer Assistance: cues for technique and for maintaining NWB status; assist to stabilize and balance Ambulation/Gait Ambulation/Gait Assistance: Not tested (comment)    Exercises     PT Diagnosis: Difficulty walking;Abnormality of gait;Acute pain  PT Problem List: Decreased activity tolerance;Decreased mobility;Decreased knowledge of use of DME;Decreased knowledge of precautions;Pain PT Treatment Interventions: DME instruction;Gait training;Stair training;Functional mobility training;Therapeutic activities;Therapeutic exercise;Patient/family education   PT Goals Acute Rehab PT Goals PT Goal Formulation: With patient Time For Goal Achievement: 09/04/12 Potential to Achieve Goals: Good Pt will go Supine/Side to Sit: with supervision PT Goal: Supine/Side to Sit - Progress: Goal set today Pt will go Sit to Supine/Side: with supervision PT Goal: Sit to Supine/Side - Progress: Goal set today Pt will go Sit to Stand: with supervision PT Goal: Sit to Stand - Progress: Goal set today Pt will go Stand to Sit: with supervision PT Goal: Stand to Sit -  Progress: Goal set today Pt will Transfer Bed to Chair/Chair to Bed: with supervision PT Transfer Goal: Bed to Chair/Chair to Bed - Progress: Goal set today Pt will  Ambulate: 51 - 150 feet;with supervision;with rolling walker PT Goal: Ambulate - Progress: Goal set today Pt will Go Up / Down Stairs: 3-5 stairs;with +2 total assist;with rolling walker PT Goal: Up/Down Stairs - Progress: Goal set today  Visit Information  Last PT Received On: 08/28/12 Assistance Needed: +2 PT/OT Co-Evaluation/Treatment: Yes    Subjective Data  Subjective: " I've had dilaudid, I am not feeling too much pain" Patient Stated Goal: return to work and work outdoors   Prior [employer] 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67, attends school) Available Help at Discharge: Available PRN/intermittently Type of Home: House Home Access: Stairs to enter [employer] of Steps: 5 Entrance Stairs-Rails: None Home Layout: Two level;Able to live on main level with bedroom/bathroom Bathroom Shower/Tub: Tub/shower unit;Other (comment) Bathroom Toilet: Standard Home Adaptive Equipment: None;Other (comment) (family to check) Prior Function Level of Independence: Independent Able to Take Stairs?: Yes Driving: Yes Vocation: Full time employment Communication Communication: No difficulties    Cognition  Cognition Arousal/Alertness: Awake/alert Behavior During Therapy: WFL for tasks assessed/performed Overall Cognitive Status: Within Functional Limits for tasks assessed    Extremity/Trunk Assessment Right Upper Extremity Assessment RUE ROM/Strength/Tone: WFL for tasks assessed RUE Sensation: WFL - Light Touch RUE Coordination: WFL - gross/fine motor Left Upper Extremity Assessment LUE ROM/Strength/Tone: WFL for tasks assessed LUE Sensation: WFL - Light Touch LUE Coordination: WFL - gross/fine motor Right Lower Extremity Assessment RLE ROM/Strength/Tone: WFL for tasks assessed RLE Sensation: WFL - Light Touch RLE Coordination: WFL - gross/fine motor Left Lower Extremity Assessment LLE ROM/Strength/Tone: Deficits;Unable to fully assess;Due to  precautions LLE ROM/Strength/Tone Deficits: good ankle pump; bledsoe brace in place LLE Sensation: WFL - Light Touch Trunk Assessment Trunk Assessment: Normal   Balance    End of Session PT - End of Session Equipment Utilized During Treatment: Gait belt;Other (comment) (bledsoe brace) Activity Tolerance: Patient limited by pain Patient left: in chair;with call bell/phone within reach;with family/visitor present Nurse Communication: Mobility status;Precautions;Weight bearing status  GP     Ferman Hamming 08/28/2012, 4:06 PM Weldon Picking PT Acute Rehab Services [phone] [place] [phone]

## 2012-08-28 NOTE — Transfer of Care (Signed)
Immediate Anesthesia Transfer of Care Note  Patient: Lauren Greene  Procedure(s) Performed: Procedure(s): INTRAMEDULLARY (IM) RETROGRADE FEMORAL NAILING (Left)  Patient Location: PACU  Anesthesia Type:General  Level of Consciousness: awake, alert  and oriented  Airway & Oxygen Therapy: Patient Spontanous Breathing and Patient connected to nasal cannula oxygen  Post-op Assessment: Report given to PACU RN and Post -op Vital signs reviewed and stable  Post vital signs: Reviewed and stable  Complications: No apparent anesthesia complications

## 2012-08-28 NOTE — Progress Notes (Signed)
SW will await PT/OT notes to determine discharge disposition. SW will continue to follow.  Sabino Niemann, MSW, [phone]

## 2012-08-28 NOTE — Progress Notes (Signed)
Orthopedic Tech Progress Note Patient Details:  Lauren Greene [birth year] [id number]   [facility] - Abelardo Diesel M 08/28/2012, 6:17 AM

## 2012-08-28 NOTE — Anesthesia Postprocedure Evaluation (Signed)
  Anesthesia Post-op Note  Patient: Lauren Greene  Procedure(s) Performed: Procedure(s): INTRAMEDULLARY (IM) RETROGRADE FEMORAL NAILING (Left)  Patient Location: PACU  Anesthesia Type:General  Level of Consciousness: awake, alert  and oriented  Airway and Oxygen Therapy: Patient Spontanous Breathing and Patient connected to nasal cannula oxygen  Post-op Pain: mild  Post-op Assessment: Post-op Vital signs reviewed  Post-op Vital Signs: Reviewed  Complications: No apparent anesthesia complications

## 2012-08-28 NOTE — Progress Notes (Signed)
UR COMPLETED  

## 2012-08-29 LAB — PROTIME-INR
INR: 1.09 (ref 0.00–1.49)
Prothrombin Time: 14 seconds (ref 11.6–15.2)

## 2012-08-29 MED ORDER — WARFARIN SODIUM 10 MG PO TABS
10.0000 mg | ORAL_TABLET | Freq: Once | ORAL | Status: AC
  Administered 2012-08-29: 10 mg via ORAL
  Filled 2012-08-29: qty 1

## 2012-08-29 MED ORDER — AMPHETAMINE-DEXTROAMPHET ER 10 MG PO CP24
40.0000 mg | ORAL_CAPSULE | Freq: Every day | ORAL | Status: DC
  Administered 2012-08-30: 40 mg via ORAL
  Filled 2012-08-29: qty 4

## 2012-08-29 NOTE — Progress Notes (Signed)
Subjective: 2 Days Post-Op Procedure(s) (LRB): INTRAMEDULLARY (IM) RETROGRADE FEMORAL NAILING (Left) Patient reports pain as 6 on 0-10 scale.    Objective: Vital signs in last 24 hours: Temp:  [98.3 F (36.8 C)-99.5 F (37.5 C)] 98.9 F (37.2 C) (06/14 0500) Pulse Rate:  [62-69] 69 (06/14 0500) Resp:  [18] 18 (06/14 0500) BP: (109-117)/(51-68) 117/53 mmHg (06/14 0500) SpO2:  [93 %-97 %] 94 % (06/14 0500)  Intake/Output from previous day: 06/13 0701 - 06/14 0700 In: 1390 [P.O.:480; I.V.:910] Out: 2375 [Urine:2375] Intake/Output this shift: Total I/O In: 240 [P.O.:240] Out: -    Recent Labs  08/27/12 1318 08/27/12 1933  HGB 13.1 12.5    Recent Labs  08/27/12 1318 08/27/12 1933  WBC 8.5 7.9  RBC 4.70 4.48  HCT 39.4 37.3  PLT 215 187    Recent Labs  08/27/12 1318 08/27/12 1933  NA 140 141  K 3.7 4.7  CL 104 108  CO2 26 28  BUN 21 19  CREATININE 0.70 0.60  GLUCOSE 112* 90  CALCIUM 8.9 9.1    Recent Labs  08/28/12 0528 08/29/12 0555  INR 1.06 1.09    Neurologically intact Incision: dressing C/D/I  Assessment/Plan: 2 Days Post-Op Procedure(s) (LRB): INTRAMEDULLARY (IM) RETROGRADE FEMORAL NAILING (Left) Up with therapy SL IV.   Was on pain meds after back surgery a couple months ago and has had increased pain from Hip so had been taking increased PO pain meds pre-op. Continue therapy and mobilization  Jalen Oberry C 08/29/2012, 10:42 AM

## 2012-08-29 NOTE — Progress Notes (Signed)
PT Cancellation Note  Patient Details Name: Lauren Greene MRN: [id number] DOB: June 28, 1965   Cancelled Treatment:    Reason Eval/Treat Not Completed: Fatigue/lethargy limiting ability to participate;Pain limiting ability to participate.  Attempted to see patient x2 this am.  Reports she didn't sleep and asked that we return in pm.  Returned in pm - patient declined PT stating she was up with OT earlier.  Will return in am for PT   Vena Austria 08/29/2012, 2:31 PM Durenda Hurt. Renaldo Fiddler, [facility] Acute Rehab Services Pager [phone]'

## 2012-08-29 NOTE — Progress Notes (Signed)
Met with patient to discuss d/c plans- she is planning d/c to home with family support- her son can assist her at home- CSW will sign off- RNCM to assist with HH/DME needs. Please re-consult CSW if SW needs arise- Reece Levy, MSW, LCSWA [phone]/weekend coverage

## 2012-08-29 NOTE — Progress Notes (Signed)
ANTICOAGULATION CONSULT NOTE   Pharmacy Consult for Coumadin Indication: VTE prophylaxis  Labs:  Recent Labs  08/27/12 1318 08/27/12 1933 08/28/12 0528 08/29/12 0555  HGB 13.1 12.5  --   --   HCT 39.4 37.3  --   --   PLT 215 187  --   --   APTT  --  28  --   --   LABPROT 12.7 12.8 13.7 14.0  INR 0.96 0.97 1.06 1.09  CREATININE 0.70 0.60  --   --     The CrCl is unknown because both a height and weight (above a minimum accepted value) are required for this calculation.   Assessment: 47 y.o. female presents s/p mechanical fall. To OR 6/13 for L femur fracture - s/p IM femoral nailing. Started on coumadin 6/13 for VTE prophylaxis post-op. Baseline INR 0.97, now up slightly to 1.09. Baseline CBC ok. No bleeding events noted  Goal of Therapy:  INR 2-3 Monitor platelets by anticoagulation protocol: Yes   Plan:  1. Daily INR 2. Repeat coumadin 10mg  po today 3. Follow up on Coumadin education  Sheppard Coil PharmD., BCPS Clinical Pharmacist Pager [phone] 08/29/2012 11:01 AM

## 2012-08-29 NOTE — Progress Notes (Signed)
Subjective: 2 Days Post-Op Procedure(s) (LRB): INTRAMEDULLARY (IM) RETROGRADE FEMORAL NAILING (Left) Patient reports pain as 6 on 0-10 scale.    Objective: Vital signs in last 24 hours: Temp:  [98.3 F (36.8 C)-99.5 F (37.5 C)] 98.9 F (37.2 C) (06/14 0500) Pulse Rate:  [62-69] 69 (06/14 0500) Resp:  [18] 18 (06/14 0500) BP: (109-117)/(51-68) 117/53 mmHg (06/14 0500) SpO2:  [93 %-97 %] 94 % (06/14 0500)  Intake/Output from previous day: 06/13 0701 - 06/14 0700 In: 1390 [P.O.:480; I.V.:910] Out: 2375 [Urine:2375] Intake/Output this shift: Total I/O In: 240 [P.O.:240] Out: -    Recent Labs  08/27/12 1318 08/27/12 1933  HGB 13.1 12.5    Recent Labs  08/27/12 1318 08/27/12 1933  WBC 8.5 7.9  RBC 4.70 4.48  HCT 39.4 37.3  PLT 215 187    Recent Labs  08/27/12 1318 08/27/12 1933  NA 140 141  K 3.7 4.7  CL 104 108  CO2 26 28  BUN 21 19  CREATININE 0.70 0.60  GLUCOSE 112* 90  CALCIUM 8.9 9.1    Recent Labs  08/28/12 0528 08/29/12 0555  INR 1.06 1.09    Neurologically intact  Assessment/Plan: 2 Days Post-Op Procedure(s) (LRB): INTRAMEDULLARY (IM) RETROGRADE FEMORAL NAILING (Left) Up with therapy ,   In recliner.   Tniya Bowditch C 08/29/2012, 11:33 AM

## 2012-08-29 NOTE — Progress Notes (Signed)
Occupational Therapy Treatment Patient Details Name: CYNDY BRAVER MRN: [id number] DOB: 1965-06-14 Today's Date: 08/29/2012 Time: 1010-1049 OT Time Calculation (min): 39 min  OT Assessment / Plan / Recommendation Comments on Treatment Session           Precautions / Restrictions Precautions Precautions: Fall Required Braces or Orthoses: Other Brace/Splint Other Brace/Splint: bledsoe brace at all times Restrictions Weight Bearing Restrictions: Yes LLE Weight Bearing: Non weight bearing   Pertinent Vitals/Pain     ADL  Eating/Feeding: Independent Where Assessed - Eating/Feeding: Chair Grooming: Set up;Brushing hair Where Assessed - Grooming: Supported sitting Upper Body Dressing: Minimal assistance Where Assessed - Upper Body Dressing: Supported sitting Lower Body Dressing: Maximal assistance Where Assessed - Lower Body Dressing: Supported sit to Pharmacist, hospital: Performed;Minimal Dentist Method: Sit to Barista: Materials engineer and Hygiene: Performed;Minimal assistance Where Assessed - Engineer, mining and Hygiene: Standing Equipment Used: Gait belt;Other (comment);Rolling walker Transfers/Ambulation Related to ADLs: min A with transfers this date; ambulated with RW to bathroom hopping on RLE with min A ADL Comments: Patient doing much better with mobility this date. Family present and assisting.    OT Diagnosis:    OT Problem List:   OT Treatment Interventions:     OT Goals ADL Goals ADL Goal: Grooming - Progress: Progressing toward goals ADL Goal: Toilet Transfer - Progress: Progressing toward goals ADL Goal: Toileting - Clothing Manipulation - Progress: Progressing toward goals ADL Goal: Toileting - Hygiene - Progress: Progressing toward goals  Visit Information  Last OT Received On: 08/29/12 Assistance Needed: +1    Subjective Data      Prior Functioning        Cognition  Cognition Arousal/Alertness: Awake/alert Behavior During Therapy: WFL for tasks assessed/performed Overall Cognitive Status: Within Functional Limits for tasks assessed    Mobility  Bed Mobility Bed Mobility: Supine to Sit;Sitting - Scoot to Edge of Bed Supine to Sit: 4: Min assist Sitting - Scoot to [place] of Bed: 4: Min guard Sit to Supine: Not Tested (comment) Transfers Transfers: Sit to Stand;Stand to Sit Sit to Stand: 4: Min assist Stand to Sit: 4: Min assist Details for Transfer Assistance: did well with maintaining NWB status this date    Exercises      Balance     End of Session OT - End of Session Equipment Utilized During Treatment: Gait belt;Other (comment) Activity Tolerance: Patient tolerated treatment well Patient left: in chair;with call bell/phone within reach;with family/visitor present  GO     Emersynn Deatley A 08/29/2012, 11:57 AM

## 2012-08-30 LAB — PROTIME-INR
INR: 1.07 (ref 0.00–1.49)
Prothrombin Time: 13.8 seconds (ref 11.6–15.2)

## 2012-08-30 MED ORDER — OXYCODONE-ACETAMINOPHEN 5-325 MG PO TABS: 2.0000 | ORAL_TABLET | ORAL | Status: DC | PRN

## 2012-08-30 MED ORDER — WARFARIN SODIUM 7.5 MG PO TABS
15.0000 mg | ORAL_TABLET | Freq: Once | ORAL | Status: DC
  Filled 2012-08-30: qty 2

## 2012-08-30 MED ORDER — METHOCARBAMOL 500 MG PO TABS: 500.0000 mg | ORAL_TABLET | Freq: Four times a day (QID) | ORAL | Status: DC | PRN

## 2012-08-30 MED ORDER — ENOXAPARIN SODIUM 40 MG/0.4ML ~~LOC~~ SOLN: 40.0000 mg | SUBCUTANEOUS | Status: DC

## 2012-08-30 NOTE — Progress Notes (Signed)
08/30/2012 1700 Made [facility] of pt's dc home today. Isidoro Donning RN CCM Case Mgmt phone [phone]

## 2012-08-30 NOTE — Progress Notes (Signed)
Physical Therapy Treatment Patient Details Name: Lauren Greene MRN: [id number] DOB: 08/23/1965 Today's Date: 08/30/2012 Time: 1191-4782 PT Time Calculation (min): 28 min  PT Assessment / Plan / Recommendation Comments on Treatment Session  Pt very eager to be discharged today, and assures me that she can get into her home safely; Recommend continued HHPT    Follow Up Recommendations  Home health PT;Supervision/Assistance - 24 hour;Supervision for mobility/OOB     Does the patient have the potential to tolerate intense rehabilitation     Barriers to Discharge        Equipment Recommendations  Rolling walker with 5" wheels    Recommendations for Other Services    Frequency Min 6X/week   Plan Discharge plan remains appropriate    Precautions / Restrictions Precautions Precautions: Fall Required Braces or Orthoses: Other Brace/Splint Other Brace/Splint: bledsoe brace at all times Restrictions LLE Weight Bearing: Non weight bearing   Pertinent Vitals/Pain no apparent distress     Mobility  Transfers Transfers: Sit to Stand;Stand to Sit Sit to Stand: 5: Supervision Stand to Sit: 5: Supervision Details for Transfer Assistance: did well with maintaining NWB status this date Ambulation/Gait Ambulation/Gait Assistance: 4: Min guard;5: Supervision Ambulation Distance (Feet): 80 Feet (40x2) Assistive device: Rolling walker Ambulation/Gait Assistance Details: Managing NWB well; took 1 standing rest break; reported tiredness in arms Gait Pattern: Step-to pattern Stairs: Yes Stairs Assistance: 4: Min assist Stairs Assistance Details (indicate cue type and reason): Verbal and demo cues for backwards technique; Pt performed 1 step well, then she opted not to perform any more; States she has "her way" of getting up her steps, and has very strong people to help her; This therapist demonstrated also the technique for "sitting back" into a chair placed very close to the edge of the top  step; Between the one-stpe backwards technique, the pt's "way" of going up, and the sitting back technique, the pt assures me that she will be able to safely get in her house today Stair Management Technique: Backwards;No rails;With walker Number of Stairs: 1    Exercises     PT Diagnosis:    PT Problem List:   PT Treatment Interventions:     PT Goals Acute Rehab PT Goals Time For Goal Achievement: 09/04/12 Potential to Achieve Goals: Good Pt will go Sit to Stand: with supervision PT Goal: Sit to Stand - Progress: Met Pt will go Stand to Sit: with supervision PT Goal: Stand to Sit - Progress: Met Pt will Ambulate: 51 - 150 feet;with supervision;with rolling walker PT Goal: Ambulate - Progress: Met Pt will Go Up / Down Stairs: 3-5 stairs;with +2 total assist;with rolling walker PT Goal: Up/Down Stairs - Progress: Progressing toward goal  Visit Information  Last PT Received On: 08/30/12 Assistance Needed: +1    Subjective Data  Subjective: "I have my way of managing steps at home" 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

## 2012-08-30 NOTE — Progress Notes (Signed)
ANTICOAGULATION CONSULT NOTE   Pharmacy Consult for Coumadin Indication: VTE prophylaxis  Labs:  Recent Labs  08/27/12 1318 08/27/12 1933 08/28/12 0528 08/29/12 0555 08/30/12 0530  HGB 13.1 12.5  --   --   --   HCT 39.4 37.3  --   --   --   PLT 215 187  --   --   --   APTT  --  28  --   --   --   LABPROT 12.7 12.8 13.7 14.0 13.8  INR 0.96 0.97 1.06 1.09 1.07  CREATININE 0.70 0.60  --   --   --     The CrCl is unknown because both a height and weight (above a minimum accepted value) are required for this calculation.   Assessment: 47 y.o. female presents s/p mechanical fall. To OR 6/13 for L femur fracture - s/p IM femoral nailing. Started on coumadin 6/13 for VTE prophylaxis post-op. INR unchanged overnight, patient's coumadin requirements appear higher than expected. No bleeding events noted.  Goal of Therapy:  INR 2-3 Monitor platelets by anticoagulation protocol: Yes   Plan:  1. Daily INR 2. Coumadin 15mg  po today 3. Follow up on Coumadin education  Sheppard Coil PharmD., BCPS Clinical Pharmacist Pager [phone] 08/30/2012 11:39 AM

## 2012-08-31 ENCOUNTER — Telehealth: Payer: Self-pay | Admitting: Family Medicine

## 2012-08-31 ENCOUNTER — Other Ambulatory Visit: Payer: Self-pay

## 2012-08-31 MED ORDER — VENLAFAXINE HCL ER 75 MG PO CP24: 75.0000 mg | ORAL_CAPSULE | Freq: Three times a day (TID) | ORAL | Status: DC

## 2012-08-31 NOTE — Telephone Encounter (Signed)
Pt had Trauma Surgery on the 12th of June, femur fracture. Wants to know if you want to go up on her effexor? Pt is feeling really depressed, lower appetite, etc, please advises

## 2012-08-31 NOTE — Telephone Encounter (Signed)
It is okay to go ahead and go up on Effexor. Next dose 225 mg daily. That would be Effexor at 75 mg XR, take 3 daily. Number 9D. 3 refills. Patient should followup here within 2-4 weeks. Or as soon as feasible. Certainly if the patient is getting severely depressed she needs to make every effort to come in right away

## 2012-08-31 NOTE — Telephone Encounter (Signed)
Notified patient's home health nurse it is okay to go ahead and go up on Effexor. Next dose 225 mg daily. That would be Effexor at 75 mg XR, take 3 daily. Number 9D. 3 refills. Patient should followup here within 2-4 weeks. Or as soon as feasible. Certainly if the patient is getting severely depressed she needs to make every effort to come in right away. Rx sent in to [facility]. Nurse verbalized understanding.

## 2012-08-31 NOTE — Telephone Encounter (Signed)
According to Epic, current dosage is Effexor XR 150 mg qd.

## 2012-09-01 ENCOUNTER — Encounter (HOSPITAL_COMMUNITY): Payer: Self-pay | Admitting: Orthopaedic Surgery

## 2012-09-01 ENCOUNTER — Ambulatory Visit: Payer: BC Managed Care – PPO

## 2012-09-02 ENCOUNTER — Ambulatory Visit: Payer: Medicaid Other | Admitting: Family Medicine

## 2012-09-16 NOTE — Discharge Summary (Signed)
Patient ID: Lauren Greene MRN: [id number] DOB/AGE: October 03, 1965 47 y.o.  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 Arthritis   . Migraine   . Back pain     Surgeries: Procedure(s): INTRAMEDULLARY (IM) RETROGRADE FEMORAL NAILING on 08/27/2012 - 08/28/2012   Consultants:  PT  Discharged Condition: Improved  Hospital Course: Lauren Greene is an 47 y.o. female who was admitted 08/27/2012 for operative treatment ofPeriprosthetic fracture around internal prosthetic left knee joint. Patient has severe unremitting pain that affects sleep, daily activities, and work/hobbies. After pre-op clearance the patient was taken to the operating room on 08/27/2012 - 08/28/2012 and underwent  Procedure(s): INTRAMEDULLARY (IM) RETROGRADE FEMORAL NAILING.    Patient was given perioperative antibiotics:  Anti-infectives   Start     Dose/Rate Route Frequency Ordered Stop   08/28/12 0600  vancomycin (VANCOCIN) IVPB 1000 mg/200 mL premix     1,000 mg 200 mL/hr over 60 Minutes Intravenous Every 12 hours 08/28/12 0226 08/28/12 0615   08/27/12 2000  vancomycin (VANCOCIN) IVPB 1000 mg/200 mL premix     1,000 mg 200 mL/hr over 60 Minutes Intravenous  Once 08/27/12 1929 08/27/12 2134       Patient was given sequential compression devices, early ambulation, and chemoprophylaxis to prevent DVT.  Patient benefited maximally from hospital stay and there were no complications.    Recent vital signs: No data found.    Recent laboratory studies: No results found for this basename: WBC, HGB, HCT, PLT, NA, K, CL, CO2, BUN, CREATININE, GLUCOSE, PT, INR, CALCIUM, 2,  in the last 72 hours   Discharge Medications:     Medication List         amphetamine-dextroamphetamine 20 MG 24 hr capsule  Commonly known as:   ADDERALL XR  Take 40 mg by mouth daily.     Diclofenac Sodium CR 100 MG 24 hr tablet  Take 100 mg by mouth 2 (two) times daily. Take 2 tablets daily     enoxaparin 40 MG/0.4ML injection  Commonly known as:  LOVENOX  Inject 0.4 mLs (40 mg total) into the skin daily.     methocarbamol 500 MG tablet  Commonly known as:  ROBAXIN  Take 1 tablet (500 mg total) by mouth every 6 (six) hours as needed.     oxyCODONE-acetaminophen 5-325 MG per tablet  Commonly known as:  PERCOCET/ROXICET  Take 2 tablets by mouth every 4 (four) hours as needed.     pregabalin 50 MG capsule  Commonly known as:  LYRICA  Take 75 mg by mouth 2 (two) times daily.     PREVACID PO  Take 1 tablet by mouth daily as needed (heartburn).        Diagnostic Studies: Dg Femur Left  08/28/2012   *RADIOLOGY REPORT*  Clinical Data: Fever fracture for internal fixation.  LEFT FEMUR - 2 VIEW  Comparison: 08/27/2012  Findings: Fluoroscopy time is recorded at 3.9 minutes  Spot fluoroscopic images of the left femur and left knee are obtained intraoperatively for surgical control purposes.  Views obtained demonstrate placement of a intramedullary rod in the femur with improved alignment of a comminuted distal femoral metaphyseal fractures since previous study.  Intramedullary rod is fixed with single locking screw superiorly and three locking screws inferiorly.  Femoral component of a left knee arthroplasty appears stable in  appearance.  IMPRESSION: Intraoperative fluoroscopy obtained for surgical control purposes demonstrating intramedullary rod fixation of a comminuted fracture of the distal left femoral metaphysis.   Original Report Authenticated By: Burman Nieves, M.D.   Dg Chest Portable 1 View  08/27/2012   *RADIOLOGY REPORT*  Clinical Data: Preoperative respiratory exam for fracture of the distal left femur.  PORTABLE CHEST - 1 VIEW  Comparison: 07/24/2010  Findings: The heart is mildly enlarged.  No edema, infiltrate or  pleural fluid is identified.  Bony thorax is unremarkable.  IMPRESSION: Mild cardiomegaly.  No active disease.   Original Report Authenticated By: Irish Lack, M.D.   Dg Knee Complete 4 Views Left  08/27/2012   *RADIOLOGY REPORT*  Clinical Data: Fall with knee pain.  LEFT KNEE - COMPLETE 4+ VIEW  Comparison: None  Findings: Left total knee replacement identified without hardware complicating features.  A mildly comminuted oblique fracture of the distal femoral diaphysis is identified with 1 cm posterior and lateral displacement. There is no evidence of subluxation or dislocation. No focal bony lesions are present.  IMPRESSION: Mildly comminuted oblique distal femoral fracture as described.   Original Report Authenticated By: Harmon Pier, M.D.   Dg C-arm [phone] Min  09/02/2012   *RADIOLOGY REPORT*  Clinical Data: Fever fracture for internal fixation.  LEFT FEMUR - 2 VIEW  Comparison: 08/27/2012  Findings: Fluoroscopy time is recorded at 3.9 minutes  Spot fluoroscopic images of the left femur and left knee are obtained intraoperatively for surgical control purposes.  Views obtained demonstrate placement of a intramedullary rod in the femur with improved alignment of a comminuted distal femoral metaphyseal fractures since previous study.  Intramedullary rod is fixed with single locking screw superiorly and three locking screws inferiorly.  Femoral component of a left knee arthroplasty appears stable in appearance.  IMPRESSION: Intraoperative fluoroscopy obtained for surgical control purposes demonstrating intramedullary rod fixation of a comminuted fracture of the distal left femoral metaphysis.   Original Report Authenticated By: Burman Nieves, M.D.    Disposition: 06-Home-Health Care Svc        Follow-up Information   Follow up with Kathryne Hitch, MD In 1 week.   Contact information:   [address] [place] [address] [phone]       Follow up with [facility].  [facility] and RN)    Contact information:   [phone]       Signed: Richardean Canal 09/16/2012, 8:48 AM

## 2012-10-06 ENCOUNTER — Encounter: Payer: Self-pay | Admitting: Family Medicine

## 2012-10-06 ENCOUNTER — Ambulatory Visit (INDEPENDENT_AMBULATORY_CARE_PROVIDER_SITE_OTHER): Payer: Medicaid Other | Admitting: Family Medicine

## 2012-10-06 VITALS — BP 130/82

## 2012-10-06 DIAGNOSIS — F988 Other specified behavioral and emotional disorders with onset usually occurring in childhood and adolescence: Secondary | ICD-10-CM

## 2012-10-06 MED ORDER — AMPHETAMINE-DEXTROAMPHET ER 30 MG PO CP24: ORAL_CAPSULE | ORAL | Status: DC

## 2012-10-06 NOTE — Progress Notes (Signed)
  Subjective:    Patient ID: Lauren Greene, female    DOB: May 26, 1965, 47 y.o.   MRN: [id number]  HPI  Patient arrives for ADHD check up. Patient doing well with medication she feels stronger dose would help better. She's not having any side effects. She feels it would help her focus and concentration. PMH ADD family history noncontributory recently broke her leg trying to eat healthy and lose weight Review of Systems    denies headaches chest pain shortness of breath Objective:   Physical Exam Lungs clear hearts regular pulse normal       Assessment & Plan:  ADD-increased medication. Recommend Adderall XR 30 mg 2 every morning. 3 prescriptions written followup 3 months. Call us if any problems.

## 2012-10-21 ENCOUNTER — Telehealth: Payer: Self-pay | Admitting: Family Medicine

## 2012-10-21 NOTE — Telephone Encounter (Signed)
Dr Lorin Picket said he would see him in am

## 2012-10-21 NOTE — Telephone Encounter (Signed)
Mom called wanting appt. For son who is 14yrs old sorethroat,earpain but on medicaid so I ask Beth she stated no because we are not taking on new medicaid patient.Tried to explain but she said you would take them both but not in the book and I didn't know if you knew they were on medicaid.

## 2012-11-09 ENCOUNTER — Telehealth: Payer: Self-pay | Admitting: Obstetrics & Gynecology

## 2012-11-09 NOTE — Telephone Encounter (Signed)
LMTCB spoke with a friend named Albin Felling she was on her way to pick up patient, she asked if I could Call a couple other patients and call back in a few minutes. I told her if I didn't call today someone will call Her tomorrow our answering machine was on.

## 2012-11-09 NOTE — Telephone Encounter (Signed)
Patient found a lump in her left breast and she wants to schedule an appointment to get an order for a mammogram.

## 2012-11-10 ENCOUNTER — Telehealth: Payer: Self-pay | Admitting: Family Medicine

## 2012-11-10 NOTE — Telephone Encounter (Signed)
LMTCB  aa 

## 2012-11-10 NOTE — Telephone Encounter (Signed)
Call to patient to confirm insurance since during appt entry, [place] is noted.  Patient confirms insurance so notified that we must have referral from PCP for [place] to see her.  She will call them.  Appt is scheduled for 11-12-12 at 830.  Marchelle Folks from Dr Lilyan Punt calls and NPI received, ok to see patient and they will enter referral. NPI [phone]

## 2012-11-10 NOTE — Telephone Encounter (Signed)
Spoke with pt whose partner, Paula Compton, found a lump in pt's breast. Pt wants to see SM to get it checked out. Scheduled OV with SM 11-12-12 at 8:30.

## 2012-11-10 NOTE — Telephone Encounter (Signed)
Patient needs a referral to [facility]. She has found a lump in her breast and they need a referral done for her appt on 11/12/12.

## 2012-11-10 NOTE — Telephone Encounter (Signed)
Call to home #. Mailbox unable to receive messages.

## 2012-11-11 ENCOUNTER — Other Ambulatory Visit: Payer: Self-pay | Admitting: Family Medicine

## 2012-11-11 DIAGNOSIS — N63 Unspecified lump in unspecified breast: Secondary | ICD-10-CM

## 2012-11-11 NOTE — Telephone Encounter (Signed)
Done, referral complete, NPI number given

## 2012-11-11 NOTE — Telephone Encounter (Signed)
Patient states that she has an appointment with them in the morning at 8:30. They only need an order from our office for a referral.

## 2012-11-11 NOTE — Telephone Encounter (Signed)
Tell pt I started the process plz make sure Brendale knowes

## 2012-11-12 ENCOUNTER — Encounter: Payer: Self-pay | Admitting: Obstetrics & Gynecology

## 2012-11-12 ENCOUNTER — Ambulatory Visit (INDEPENDENT_AMBULATORY_CARE_PROVIDER_SITE_OTHER): Payer: Medicaid Other | Admitting: Obstetrics & Gynecology

## 2012-11-12 VITALS — BP 158/86 | HR 68 | Resp 16 | Ht 67.75 in | Wt 281.0 lb

## 2012-11-12 DIAGNOSIS — Z1239 Encounter for other screening for malignant neoplasm of breast: Secondary | ICD-10-CM

## 2012-11-12 DIAGNOSIS — N6459 Other signs and symptoms in breast: Secondary | ICD-10-CM

## 2012-11-12 DIAGNOSIS — R922 Inconclusive mammogram: Secondary | ICD-10-CM

## 2012-11-12 NOTE — Patient Instructions (Signed)
We will call if you need any additional testing after your mammogram.

## 2012-11-12 NOTE — Progress Notes (Signed)
Subjective:     Patient ID: Lauren Greene, female   DOB: 14-Nov-1965, 47 y.o.   MRN: [id number]  HPI 47 yo Partnered WF here for breast check.  Partner felt a lump about two weeks ago.  Then they saw a Dr. Neil Crouch show about not carrying cell phones near breast.  No pain.  No nipple discharge.    Review of Systems  All other systems reviewed and are negative.       Objective:   Physical Exam  Constitutional: She is oriented to person, place, and time. She appears well-developed and well-nourished.  HENT:  Head: Normocephalic and atraumatic.  Neck: No tracheal deviation present. No thyromegaly present.  Pulmonary/Chest: Right breast exhibits no inverted nipple, no mass, no nipple discharge, no skin change and no tenderness. Left breast exhibits no inverted nipple, no mass, no nipple discharge, no skin change and no tenderness. Breasts are symmetrical.    Lymphadenopathy:    She has no cervical adenopathy.  Neurological: She is alert and oriented to person, place, and time.  Skin: Skin is warm and dry.       Assessment:     Breast density     Plan:     Recommended 3D screening MMG.  Order entered.

## 2012-12-03 ENCOUNTER — Ambulatory Visit
Admission: RE | Admit: 2012-12-03 | Discharge: 2012-12-03 | Disposition: A | Discharge status: Home / Self Care | Payer: Medicaid Other | Source: Ambulatory Visit | Attending: Obstetrics & Gynecology | Admitting: Obstetrics & Gynecology

## 2012-12-03 DIAGNOSIS — Z1239 Encounter for other screening for malignant neoplasm of breast: Secondary | ICD-10-CM

## 2012-12-08 ENCOUNTER — Telehealth: Payer: Self-pay | Admitting: Obstetrics & Gynecology

## 2012-12-14 ENCOUNTER — Other Ambulatory Visit: Payer: Self-pay | Admitting: Obstetrics & Gynecology

## 2012-12-14 MED ORDER — AZITHROMYCIN 250 MG PO TABS: ORAL_TABLET | ORAL | Status: DC

## 2013-01-05 ENCOUNTER — Ambulatory Visit (INDEPENDENT_AMBULATORY_CARE_PROVIDER_SITE_OTHER): Payer: Medicaid Other | Admitting: Family Medicine

## 2013-01-05 ENCOUNTER — Encounter: Payer: Self-pay | Admitting: Family Medicine

## 2013-01-05 VITALS — BP 142/92 | Ht 69.0 in | Wt 281.0 lb

## 2013-01-05 DIAGNOSIS — G8929 Other chronic pain: Secondary | ICD-10-CM

## 2013-01-05 DIAGNOSIS — IMO0002 Reserved for concepts with insufficient information to code with codable children: Secondary | ICD-10-CM

## 2013-01-05 DIAGNOSIS — F988 Other specified behavioral and emotional disorders with onset usually occurring in childhood and adolescence: Secondary | ICD-10-CM

## 2013-01-05 DIAGNOSIS — M25569 Pain in unspecified knee: Secondary | ICD-10-CM

## 2013-01-05 DIAGNOSIS — F325 Major depressive disorder, single episode, in full remission: Secondary | ICD-10-CM

## 2013-01-05 DIAGNOSIS — M25559 Pain in unspecified hip: Secondary | ICD-10-CM

## 2013-01-05 HISTORY — DX: Other chronic pain: G89.29

## 2013-01-05 MED ORDER — PREGABALIN 75 MG PO CAPS: 75.0000 mg | ORAL_CAPSULE | Freq: Two times a day (BID) | ORAL | Status: DC

## 2013-01-05 MED ORDER — VENLAFAXINE HCL ER 75 MG PO CP24: 150.0000 mg | ORAL_CAPSULE | Freq: Every day | ORAL | Status: DC

## 2013-01-05 MED ORDER — AMPHETAMINE-DEXTROAMPHET ER 30 MG PO CP24: ORAL_CAPSULE | ORAL | Status: DC

## 2013-01-05 MED ORDER — DICLOFENAC SODIUM ER 100 MG PO TB24: 100.0000 mg | ORAL_TABLET | Freq: Two times a day (BID) | ORAL | Status: DC

## 2013-01-05 NOTE — Progress Notes (Signed)
  Subjective:    Patient ID: Lauren Greene, female    DOB: 1965/04/27, 47 y.o.   MRN: [id number]  HPI Patient is here today for ADD check up. Patient was seen today for ADD checkup. The following items were discussed in detail. -Compliance with medication was assessed -Importance of study time, doing homework, paying attention/taking good notes in school. -Importance of family involvement with learning -Discussion of many side effects with medications -A review of the patient's blood pressure and weight and eating habits -A review of patient's sleeping habits -Additional issues or questions that family had was addressed in noted below She was taking the medicine too early in the day and it was not giving her benefit later in the evening so therefore we talked about readjusting when she takes it she often does not go to bed until 2 AM she does school from 5:30 PM to 9 or 10 PM She needs a refill on her meds.  No concerns.  The patient was seen today in followup for depression. Discussion was held regarding the importance of taking the medication. Discussion was also held regarding the importance of notifying us if becoming more depressed and certainly emergently notifying us or going to the emergency department if becoming suicidal. Patient relates that she does not feel depressed currently and that the medicine helps and denies being suicidal  Importance of social engagement was also discussed along with behavioral techniques to lessen depression. Regular followup visits were discussed as well.  Patient does not smoke denies abusing medication she does have orthopedic pain in her knees and back she's had knee replacement they did put her on Percocet for a period of time she states a 5 mg not doing as much as a 10 mg was and she relates her orthopedist will be stopping her medicines in. Patient understands that long-term opioids or not a good idea.  Patient doesn't smoke currently.  Review  of Systems     Objective:   Physical Exam Greater than 25 minutes was spent discussing ADD and depression today       Assessment & Plan:  Arthralgias-refills on her medicine were given #2 depression stable refills on medication given ADD refills on medication given she is to use the Adderall around 1 or 2 PM and they'll give her better coverage in the evening She only uses Lyrica intermittently refills were given she ought to followup in 3 months time 25 minutes spent with patient greater than half in discussion

## 2013-01-07 ENCOUNTER — Ambulatory Visit (INDEPENDENT_AMBULATORY_CARE_PROVIDER_SITE_OTHER): Payer: Medicaid Other | Admitting: Obstetrics & Gynecology

## 2013-01-07 ENCOUNTER — Encounter: Payer: Self-pay | Admitting: Obstetrics & Gynecology

## 2013-01-07 ENCOUNTER — Encounter: Payer: Medicaid Other | Admitting: Family Medicine

## 2013-01-07 VITALS — BP 138/84 | HR 60 | Resp 16 | Ht 67.75 in | Wt 281.2 lb

## 2013-01-07 DIAGNOSIS — Z0289 Encounter for other administrative examinations: Secondary | ICD-10-CM

## 2013-01-07 DIAGNOSIS — Z124 Encounter for screening for malignant neoplasm of cervix: Secondary | ICD-10-CM

## 2013-01-07 DIAGNOSIS — Z0000 Encounter for general adult medical examination without abnormal findings: Secondary | ICD-10-CM

## 2013-01-07 DIAGNOSIS — Z01419 Encounter for gynecological examination (general) (routine) without abnormal findings: Secondary | ICD-10-CM

## 2013-01-07 DIAGNOSIS — M8580 Other specified disorders of bone density and structure, unspecified site: Secondary | ICD-10-CM

## 2013-01-07 DIAGNOSIS — M899 Disorder of bone, unspecified: Secondary | ICD-10-CM

## 2013-01-07 LAB — POCT URINALYSIS DIPSTICK
Bilirubin, UA: NEGATIVE
Blood, UA: NEGATIVE
Glucose, UA: NEGATIVE
Ketones, UA: NEGATIVE
Nitrite, UA: NEGATIVE
Urobilinogen, UA: NEGATIVE
pH, UA: 5

## 2013-01-07 LAB — THYROID PANEL WITH TSH
Free Thyroxine Index: 3.5 (ref 1.0–3.9)
T3 Uptake: 38.1 % — ABNORMAL HIGH (ref 22.5–37.0)
T4, Total: 9.2 ug/dL (ref 5.0–12.5)
TSH: 4.142 u[IU]/mL (ref 0.350–4.500)

## 2013-01-07 LAB — HEMOGLOBIN, FINGERSTICK: Hemoglobin, fingerstick: 12.4 g/dL (ref 12.0–16.0)

## 2013-01-07 MED ORDER — MUPIROCIN 2 % EX OINT: TOPICAL_OINTMENT | Freq: Three times a day (TID) | CUTANEOUS | Status: DC

## 2013-01-07 MED ORDER — LIDOCAINE 5 % EX OINT: TOPICAL_OINTMENT | Freq: Three times a day (TID) | CUTANEOUS | Status: DC

## 2013-01-07 NOTE — Patient Instructions (Signed)
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
## 2013-01-08 LAB — VITAMIN D 25 HYDROXY (VIT D DEFICIENCY, FRACTURES): Vit D, 25-Hydroxy: 31 ng/mL (ref 30–89)

## 2013-01-11 ENCOUNTER — Telehealth: Payer: Self-pay

## 2013-01-11 NOTE — Telephone Encounter (Signed)
lmtcb

## 2013-01-11 NOTE — Telephone Encounter (Signed)
Message copied by Elisha Headland on Mon Jan 11, 2013 10:25 AM ------      Message from: Jerene Bears      Created: Fri Jan 08, 2013  5:58 AM       Inform both thyroid panel and Vit D are fine.  Should take 1000 IU Vit D daily. ------

## 2013-01-12 LAB — IPS PAP TEST WITH HPV

## 2013-01-12 NOTE — Telephone Encounter (Signed)
Lmtcb//kn 

## 2013-01-12 NOTE — Telephone Encounter (Signed)
Patient aware of all results. 

## 2013-02-16 ENCOUNTER — Encounter: Payer: Self-pay | Admitting: Family Medicine

## 2013-02-16 ENCOUNTER — Ambulatory Visit (INDEPENDENT_AMBULATORY_CARE_PROVIDER_SITE_OTHER): Payer: Medicaid Other | Admitting: Family Medicine

## 2013-02-16 VITALS — BP 152/86 | Ht 69.0 in | Wt 274.0 lb

## 2013-02-16 DIAGNOSIS — R7309 Other abnormal glucose: Secondary | ICD-10-CM

## 2013-02-16 DIAGNOSIS — T148XXA Other injury of unspecified body region, initial encounter: Secondary | ICD-10-CM

## 2013-02-16 DIAGNOSIS — G8929 Other chronic pain: Secondary | ICD-10-CM

## 2013-02-16 DIAGNOSIS — M2550 Pain in unspecified joint: Secondary | ICD-10-CM

## 2013-02-16 DIAGNOSIS — M545 Low back pain, unspecified: Secondary | ICD-10-CM

## 2013-02-16 DIAGNOSIS — M549 Dorsalgia, unspecified: Secondary | ICD-10-CM

## 2013-02-16 DIAGNOSIS — R739 Hyperglycemia, unspecified: Secondary | ICD-10-CM

## 2013-02-16 LAB — CBC WITH DIFFERENTIAL/PLATELET
Basophils Absolute: 0 10*3/uL (ref 0.0–0.1)
Basophils Relative: 1 % (ref 0–1)
Eosinophils Absolute: 0.1 10*3/uL (ref 0.0–0.7)
Eosinophils Relative: 1 % (ref 0–5)
HCT: 38.3 % (ref 36.0–46.0)
Hemoglobin: 12.6 g/dL (ref 12.0–15.0)
Lymphocytes Relative: 46 % (ref 12–46)
Lymphs Abs: 2.7 10*3/uL (ref 0.7–4.0)
MCH: 26.1 pg (ref 26.0–34.0)
MCHC: 32.9 g/dL (ref 30.0–36.0)
MCV: 79.5 fL (ref 78.0–100.0)
Monocytes Absolute: 0.5 10*3/uL (ref 0.1–1.0)
Monocytes Relative: 8 % (ref 3–12)
Neutro Abs: 2.6 10*3/uL (ref 1.7–7.7)
Neutrophils Relative %: 44 % (ref 43–77)
Platelets: 257 10*3/uL (ref 150–400)
RBC: 4.82 MIL/uL (ref 3.87–5.11)
RDW: 15 % (ref 11.5–15.5)
WBC: 5.8 10*3/uL (ref 4.0–10.5)

## 2013-02-16 LAB — HEMOGLOBIN A1C
Hgb A1c MFr Bld: 5.2 % (ref <5.7)
Mean Plasma Glucose: 103 mg/dL (ref <117)

## 2013-02-16 LAB — BASIC METABOLIC PANEL
BUN: 19 mg/dL (ref 6–23)
CO2: 28 mEq/L (ref 19–32)
Calcium: 9 mg/dL (ref 8.4–10.5)
Chloride: 106 mEq/L (ref 96–112)
Creat: 0.65 mg/dL (ref 0.50–1.10)
Glucose, Bld: 73 mg/dL (ref 70–99)
Potassium: 3.5 mEq/L (ref 3.5–5.3)
Sodium: 142 mEq/L (ref 135–145)

## 2013-02-16 LAB — PROTIME-INR
INR: 1.04 (ref <1.50)
Prothrombin Time: 13.6 seconds (ref 11.6–15.2)

## 2013-02-16 LAB — SEDIMENTATION RATE: Sed Rate: 1 mm/hr (ref 0–22)

## 2013-02-16 LAB — APTT: aPTT: 29 seconds (ref 24–37)

## 2013-02-16 MED ORDER — OXYCODONE-ACETAMINOPHEN 10-325 MG PO TABS: 1.0000 | ORAL_TABLET | Freq: Four times a day (QID) | ORAL | Status: DC | PRN

## 2013-02-16 NOTE — Progress Notes (Signed)
   Subjective:    Patient ID: Lauren Greene, female    DOB: 1965-09-23, 47 y.o.   MRN: [id number]  HPI Patient arrives to discuss her bone issues- has had frequent fracture issues lately. Also has problems with frequent bruising-on a lot of anti inflammatories. Patient relates the bruising seems to be more than what she used to see but no excessive bleeding or other issues. She states her orthopedics is supposed to do a bone density on her. Would like to request testing for rheumatoid arthritis. She relates a lot of hand stiffness discomfort not feeling good. Having flare of sciatica from the way she is having to walk.  PMH obesity arthralgias chronic back pain knee pain Has had a couple recent fractures Patient states she uses her pain medicine sometimes none per day sometimes 3 or 4 per day Family history noncontributory  Review of Systems See above denies chest pain shortness of breath vomiting diarrhea rash    Objective:   Physical Exam Neck no masses lungs are clear hearts regular pulse normal blood pressure borderline 140/84 extremities no edema skin warm dry       Assessment & Plan:  Labs-were ordered. Await the results of these. I doubt rheumatoid arthritis. Apparently her orthopedist is setting her up with the rheumatoid Dr. I told her I believe that's fine if they find anything to please let us know.  Nerve conduction testing for both hands, EMG studies if nerve conduction studies are negative. Pain goes from the shoulders to the hands along with numbness I doubt cervical nerve impingement but I cannot rule that out Bone density through the ortho Percocet 10 /325  # 60, one every 4 hours when necessary severe pain discomfort cautioned drowsiness she stated that her previous doctor would give her a prescription of 120 tablets and it would "last for a long long time ". I told her that we would give her a prescription of 60 tablets and then she would let us know when she needs her  next one I would need to see her every 3 months. She was also warned against combining medications together and the potential for accidental overdose

## 2013-02-17 ENCOUNTER — Encounter: Payer: Self-pay | Admitting: Family Medicine

## 2013-03-26 ENCOUNTER — Encounter: Payer: Self-pay | Admitting: Family Medicine

## 2013-03-26 ENCOUNTER — Ambulatory Visit (INDEPENDENT_AMBULATORY_CARE_PROVIDER_SITE_OTHER): Payer: Medicaid Other | Admitting: Family Medicine

## 2013-03-26 VITALS — BP 126/80 | Ht 69.0 in | Wt 270.4 lb

## 2013-03-26 DIAGNOSIS — M549 Dorsalgia, unspecified: Secondary | ICD-10-CM

## 2013-03-26 DIAGNOSIS — G8929 Other chronic pain: Secondary | ICD-10-CM

## 2013-03-26 DIAGNOSIS — F988 Other specified behavioral and emotional disorders with onset usually occurring in childhood and adolescence: Secondary | ICD-10-CM

## 2013-03-26 MED ORDER — AMPHETAMINE-DEXTROAMPHET ER 30 MG PO CP24: ORAL_CAPSULE | ORAL | Status: DC

## 2013-03-26 MED ORDER — VENLAFAXINE HCL ER 75 MG PO CP24: 150.0000 mg | ORAL_CAPSULE | Freq: Every day | ORAL | Status: DC

## 2013-03-26 NOTE — Progress Notes (Signed)
   Subjective:    Patient ID: Lauren Greene, female    DOB: 05/12/1965, 48 y.o.   MRN: [id number]  HPI Patient is here today for her ADHD follow up visit. Needs refills on Adderall and Effexor. States she has no concerns at this time.  Patient does have some chronic pain she also relates ADHD issues medicine helps needs refills  Review of Systems  Constitutional: Negative for activity change, appetite change and fatigue.  Gastrointestinal: Negative for abdominal pain.  Neurological: Negative for headaches.  Psychiatric/Behavioral: Negative for behavioral problems.       Objective:   Physical Exam  Lungs clear heart regular pulse normal extremities no edema      Assessment & Plan:  #1 chronic back pain R. he has no Percocet when she needs more she will call followup here every 3 months ADD prescriptions written followup every 3 months

## 2013-03-26 NOTE — Patient Instructions (Signed)
DASH Diet  The DASH diet stands for "Dietary Approaches to Stop Hypertension." It is a healthy eating plan that has been shown to reduce high blood pressure (hypertension) in as little as 14 days, while also possibly providing other significant health benefits. These other health benefits include reducing the risk of breast cancer after menopause and reducing the risk of type 2 diabetes, heart disease, colon cancer, and stroke. Health benefits also include weight loss and slowing kidney failure in patients with chronic kidney disease.   DIET GUIDELINES  · Limit salt (sodium). Your diet should contain less than 1500 mg of sodium daily.  · Limit refined or processed carbohydrates. Your diet should include mostly whole grains. Desserts and added sugars should be used sparingly.  · Include small amounts of heart-healthy fats. These types of fats include nuts, oils, and tub margarine. Limit saturated and trans fats. These fats have been shown to be harmful in the body.  CHOOSING FOODS   The following food groups are based on a 2000 calorie diet. See your Registered Dietitian for individual calorie needs.  Grains and Grain Products (6 to 8 servings daily)  · Eat More Often: Whole-wheat bread, brown rice, whole-grain or wheat pasta, quinoa, popcorn without added fat or salt (air popped).  · Eat Less Often: White bread, white pasta, white rice, cornbread.  Vegetables (4 to 5 servings daily)  · Eat More Often: Fresh, frozen, and canned vegetables. Vegetables may be raw, steamed, roasted, or grilled with a minimal amount of fat.  · Eat Less Often/Avoid: Creamed or fried vegetables. Vegetables in a cheese sauce.  Fruit (4 to 5 servings daily)  · Eat More Often: All fresh, canned (in natural juice), or frozen fruits. Dried fruits without added sugar. One hundred percent fruit juice (½ cup [237 mL] daily).  · Eat Less Often: Dried fruits with added sugar. Canned fruit in light or heavy syrup.  Lean Meats, Fish, and Poultry (2  servings or less daily. One serving is 3 to 4 oz [85-114 g]).  · Eat More Often: Ninety percent or leaner ground beef, tenderloin, sirloin. Round cuts of beef, chicken breast, turkey breast. All fish. Grill, bake, or broil your meat. Nothing should be fried.  · Eat Less Often/Avoid: Fatty cuts of meat, turkey, or chicken leg, thigh, or wing. Fried cuts of meat or fish.  Dairy (2 to 3 servings)  · Eat More Often: Low-fat or fat-free milk, low-fat plain or light yogurt, reduced-fat or part-skim cheese.  · Eat Less Often/Avoid: Milk (whole, 2%). Whole milk yogurt. Full-fat cheeses.  Nuts, Seeds, and Legumes (4 to 5 servings per week)  · Eat More Often: All without added salt.  · Eat Less Often/Avoid: Salted nuts and seeds, canned beans with added salt.  Fats and Sweets (limited)  · Eat More Often: Vegetable oils, tub margarines without trans fats, sugar-free gelatin. Mayonnaise and salad dressings.  · Eat Less Often/Avoid: Coconut oils, palm oils, butter, stick margarine, cream, half and half, cookies, candy, pie.  FOR MORE INFORMATION  The Dash Diet Eating Plan: www.dashdiet.org  Document Released: 02/21/2011 Document Revised: 05/27/2011 Document Reviewed: 02/21/2011  ExitCare® Patient Information ©2014 [employer].

## 2013-05-28 ENCOUNTER — Other Ambulatory Visit (HOSPITAL_COMMUNITY): Payer: Self-pay | Admitting: Orthopedic Surgery

## 2013-05-28 ENCOUNTER — Ambulatory Visit (HOSPITAL_COMMUNITY)
Admission: RE | Admit: 2013-05-28 | Discharge: 2013-05-28 | Disposition: A | Discharge status: Home / Self Care | Payer: Medicaid Other | Source: Ambulatory Visit | Attending: Orthopedic Surgery | Admitting: Orthopedic Surgery

## 2013-05-28 DIAGNOSIS — R609 Edema, unspecified: Secondary | ICD-10-CM | POA: Insufficient documentation

## 2013-05-28 DIAGNOSIS — M79609 Pain in unspecified limb: Secondary | ICD-10-CM | POA: Insufficient documentation

## 2013-05-28 DIAGNOSIS — M79606 Pain in leg, unspecified: Secondary | ICD-10-CM

## 2013-05-28 DIAGNOSIS — M7989 Other specified soft tissue disorders: Secondary | ICD-10-CM

## 2013-06-24 ENCOUNTER — Ambulatory Visit (INDEPENDENT_AMBULATORY_CARE_PROVIDER_SITE_OTHER): Payer: Medicaid Other | Admitting: Family Medicine

## 2013-06-24 ENCOUNTER — Encounter: Payer: Self-pay | Admitting: Family Medicine

## 2013-06-24 VITALS — BP 142/88 | Ht 69.0 in | Wt 255.0 lb

## 2013-06-24 DIAGNOSIS — R5383 Other fatigue: Secondary | ICD-10-CM

## 2013-06-24 DIAGNOSIS — M1990 Unspecified osteoarthritis, unspecified site: Secondary | ICD-10-CM

## 2013-06-24 DIAGNOSIS — IMO0001 Reserved for inherently not codable concepts without codable children: Secondary | ICD-10-CM | POA: Insufficient documentation

## 2013-06-24 DIAGNOSIS — R5381 Other malaise: Secondary | ICD-10-CM

## 2013-06-24 DIAGNOSIS — M129 Arthropathy, unspecified: Secondary | ICD-10-CM

## 2013-06-24 LAB — RHEUMATOID FACTOR: Rhuematoid fact SerPl-aCnc: 12 IU/mL (ref <=14)

## 2013-06-24 LAB — SEDIMENTATION RATE: Sed Rate: 5 mm/hr (ref 0–22)

## 2013-06-24 MED ORDER — OXYCODONE-ACETAMINOPHEN 10-325 MG PO TABS: 1.0000 | ORAL_TABLET | Freq: Four times a day (QID) | ORAL | Status: DC | PRN

## 2013-06-24 MED ORDER — TIZANIDINE HCL 4 MG PO CAPS: 4.0000 mg | ORAL_CAPSULE | Freq: Three times a day (TID) | ORAL | Status: DC | PRN

## 2013-06-24 MED ORDER — VENLAFAXINE HCL ER 75 MG PO CP24: 225.0000 mg | ORAL_CAPSULE | Freq: Every day | ORAL | Status: DC

## 2013-06-24 NOTE — Progress Notes (Signed)
   Subjective:    Patient ID: Melburn Hakelaine G Mcmullan, female    DOB: 09/08/1965, 48 y.o.   MRN: [id number]  HPI Patient is here today for a 3 month check up.  She needs a refill on her meds. 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  Pt had a lack of energy.  She fell at her work.   Would like her parking card filled out. Jan- stairs basement, toe gets hung up and fell.   joint pains both shoulders, some hands, stiffness in hips and knees,stiffness in fingers and wrist [facility]alg[facility] in [facility] arms and legs [facility] [place]- tried terbinifine with out help 3 months  [facility]   Review of Systems     Objective:   Physical Exam  Neck is supple no masses upper back tenderness mid back tenderness low back tenderness tenderness in the deltoids and also on the forearms plus also tenderness in the quadriceps as well as the need region. She also has redness and swelling in the joints more in the right hand and left hand Lungs are clear heart is regular Extremities trace edema.      Assessment & Plan:  #1 probable fibromyalgia-muscle relaxer Zanaflex is safe for her. Stop Robaxin. Cautioned drowsiness 3 times a day when necessary. Increase Effexor 225 mg daily. Gentle exercise and stretching recommended. #2 possible underlying arthritis issues we will check some lab work she seen a rheumatologist in June. #3 recent falls related to orthopedic problems will see if we can do gait training will try to see if we get that approved for physical therapy. #4 chronic joint pain uses Percocet intermittently she was warned not to use as frequently.  Significant amount time spent with patient greater than half in discussion [phone]

## 2013-06-25 LAB — CK TOTAL AND CKMB (NOT AT ARMC)
CK, MB: 2.8 ng/mL (ref 0.0–5.0)
Relative Index: 2.4 (ref 0.0–4.0)
Total CK: 119 U/L (ref 7–177)

## 2013-06-25 LAB — TSH: TSH: 4.104 u[IU]/mL (ref 0.350–4.500)

## 2013-06-25 LAB — T4, FREE: Free T4: 1.08 ng/dL (ref 0.80–1.80)

## 2013-06-25 LAB — ANTI-DNA ANTIBODY, DOUBLE-STRANDED: ds DNA Ab: 1 IU/mL

## 2013-06-25 LAB — ANA: Anti Nuclear Antibody(ANA): NEGATIVE

## 2013-07-07 ENCOUNTER — Telehealth: Payer: Self-pay | Admitting: Family Medicine

## 2013-07-07 NOTE — Telephone Encounter (Signed)
Please give her a prescription for her medication one month's supply. Keep office visit in May. Informed patient lab work did look good. Please provide the patient with a copy of all the labs from that visit. She will be seeing a rheumatologist and she can bring a copy to them.

## 2013-07-07 NOTE — Telephone Encounter (Signed)
Pt called wanting to know lab results from last visit, also needs Rx for her Adderall XR 30mg , 2 daily, she states she was not given refills for it at last visit, she only needs one Rx until her OV here 07/22/13, please call pt when done

## 2013-07-08 ENCOUNTER — Telehealth: Payer: Self-pay | Admitting: Family Medicine

## 2013-07-08 ENCOUNTER — Encounter: Payer: Self-pay | Admitting: Family Medicine

## 2013-07-08 ENCOUNTER — Ambulatory Visit (INDEPENDENT_AMBULATORY_CARE_PROVIDER_SITE_OTHER): Payer: Medicaid Other | Admitting: Family Medicine

## 2013-07-08 ENCOUNTER — Other Ambulatory Visit: Payer: Self-pay | Admitting: Family Medicine

## 2013-07-08 VITALS — Ht 69.0 in | Wt 255.0 lb

## 2013-07-08 DIAGNOSIS — R230 Cyanosis: Secondary | ICD-10-CM

## 2013-07-08 DIAGNOSIS — I7389 Other specified peripheral vascular diseases: Secondary | ICD-10-CM

## 2013-07-08 DIAGNOSIS — M25579 Pain in unspecified ankle and joints of unspecified foot: Secondary | ICD-10-CM

## 2013-07-08 DIAGNOSIS — L0390 Cellulitis, unspecified: Secondary | ICD-10-CM

## 2013-07-08 DIAGNOSIS — L0291 Cutaneous abscess, unspecified: Secondary | ICD-10-CM

## 2013-07-08 MED ORDER — CHLORZOXAZONE 500 MG PO TABS: 500.0000 mg | ORAL_TABLET | Freq: Three times a day (TID) | ORAL | Status: DC

## 2013-07-08 MED ORDER — SULFAMETHOXAZOLE-TMP DS 800-160 MG PO TABS: 1.0000 | ORAL_TABLET | Freq: Two times a day (BID) | ORAL | Status: DC

## 2013-07-08 MED ORDER — AMPHETAMINE-DEXTROAMPHET ER 30 MG PO CP24: ORAL_CAPSULE | ORAL | Status: DC

## 2013-07-08 NOTE — Progress Notes (Signed)
   Subjective:    Patient ID: Lauren Greene, female    DOB: 10/08/1965, 48 y.o.   MRN: [id number]  HPI Patient reports 2nd toe pain on left foot for a few weeks. Toe has spit open and patient thinks she needs an antibiotic.  Patient saw a podiatrist who told her that she had circulation issues. She states her toes tend is stable bluish and they tend to get dry cracked and bleeding occasionally Review of Systems See above    Objective:   Physical Exam On examination her toes are bluish they could be because her little bit chilled she does not wear shoes because they hurt her feet she does relate a split in the skin that became bloody and tender she denies fever chills Slight diminished her blood pulses in the feet    Assessment & Plan:  Slight diminished pulses in the feet I recommend ankle-brachial index were see what the results of this showed  Cellulitis antibiotics prescribed keep regular followup

## 2013-07-08 NOTE — Telephone Encounter (Signed)
Pt states tizanidine 4mg  causing morning headache, can we change to something else? Was just seen and forgot to mention to you. Please advise [facility]

## 2013-07-08 NOTE — Telephone Encounter (Signed)
Discontinue that medication please. Also Parafon Forte one 3 times a day when necessary, #45 3 refills. Cautioned drowsiness.

## 2013-07-08 NOTE — Telephone Encounter (Signed)
Discussed with patient. Med sent to pharm.  

## 2013-07-08 NOTE — Telephone Encounter (Signed)
Discussed with pt

## 2013-07-08 NOTE — Telephone Encounter (Addendum)
[facility] 07/08/13 script and copy of labs up front ready for pickup. Need to let pt know her lab work looked good.

## 2013-07-13 ENCOUNTER — Ambulatory Visit (HOSPITAL_COMMUNITY)
Admission: RE | Admit: 2013-07-13 | Discharge: 2013-07-13 | Disposition: A | Discharge status: Home / Self Care | Payer: Medicaid Other | Source: Ambulatory Visit | Attending: Family Medicine | Admitting: Family Medicine

## 2013-07-13 DIAGNOSIS — I70219 Atherosclerosis of native arteries of extremities with intermittent claudication, unspecified extremity: Secondary | ICD-10-CM | POA: Insufficient documentation

## 2013-07-13 DIAGNOSIS — Z87891 Personal history of nicotine dependence: Secondary | ICD-10-CM | POA: Insufficient documentation

## 2013-07-22 ENCOUNTER — Ambulatory Visit: Payer: Medicaid Other | Admitting: Family Medicine

## 2013-08-03 ENCOUNTER — Encounter: Payer: Self-pay | Admitting: Family Medicine

## 2013-08-03 ENCOUNTER — Ambulatory Visit (INDEPENDENT_AMBULATORY_CARE_PROVIDER_SITE_OTHER): Payer: Medicaid Other | Admitting: Family Medicine

## 2013-08-03 VITALS — BP 134/88 | Ht 67.5 in | Wt 258.0 lb

## 2013-08-03 DIAGNOSIS — M25569 Pain in unspecified knee: Secondary | ICD-10-CM

## 2013-08-03 DIAGNOSIS — G8929 Other chronic pain: Secondary | ICD-10-CM

## 2013-08-03 DIAGNOSIS — M25551 Pain in right hip: Secondary | ICD-10-CM

## 2013-08-03 DIAGNOSIS — M25562 Pain in left knee: Secondary | ICD-10-CM

## 2013-08-03 DIAGNOSIS — M25559 Pain in unspecified hip: Secondary | ICD-10-CM

## 2013-08-03 DIAGNOSIS — M549 Dorsalgia, unspecified: Secondary | ICD-10-CM

## 2013-08-03 DIAGNOSIS — M25552 Pain in left hip: Secondary | ICD-10-CM

## 2013-08-03 DIAGNOSIS — F988 Other specified behavioral and emotional disorders with onset usually occurring in childhood and adolescence: Secondary | ICD-10-CM

## 2013-08-03 DIAGNOSIS — Z23 Encounter for immunization: Secondary | ICD-10-CM

## 2013-08-03 DIAGNOSIS — M25561 Pain in right knee: Secondary | ICD-10-CM

## 2013-08-03 MED ORDER — MUPIROCIN 2 % EX OINT: TOPICAL_OINTMENT | CUTANEOUS | Status: DC

## 2013-08-03 MED ORDER — AMPHETAMINE-DEXTROAMPHET ER 30 MG PO CP24
ORAL_CAPSULE | ORAL | Status: DC
Start: 2013-08-03 — End: 2013-08-03

## 2013-08-03 MED ORDER — AMPHETAMINE-DEXTROAMPHET ER 30 MG PO CP24: ORAL_CAPSULE | ORAL | Status: DC

## 2013-08-03 NOTE — Progress Notes (Signed)
   Subjective:    Patient ID: Lauren Greene, female    DOB: 04/13/1965, 48 y.o.   MRN: [id number]  HPIADD check up. Needs refill on Adderall. Patient was seen today for ADD checkup. The following items were discussed in detail. -Compliance with medication was assessed -Importance of study time, doing homework, paying attention/taking good notes in school. -Importance of family involvement with learning -Discussion of many side effects with medications -A review of the patient's blood pressure and weight and eating habits -A review of patient's sleeping habits -Additional issues or questions that family had was addressed in noted below  Discuss changing chlorzoxazone to robaxin 750 TID.  She feels that this current medication doesn't really help her she would like to try Robaxin helped her in the past. Follow up on joint pain. Burning in joints.  She is supposed to see rheumatologist coming up in June Follow up on toe pain. Left second toe. Finished antibiotics bactrim.  Still sore.       Review of Systems  Constitutional: Negative for activity change, appetite change and fatigue.  Respiratory: Negative for cough and choking.   Cardiovascular: Negative for chest pain.  Gastrointestinal: Negative for abdominal pain.  Endocrine: Negative for polydipsia and polyphagia.  Genitourinary: Negative for frequency.  Musculoskeletal: Positive for arthralgias, back pain and myalgias.  Skin: Positive for color change.  Neurological: Negative for weakness.  Psychiatric/Behavioral: Negative for confusion.       Objective:   Physical Exam  Vitals reviewed. Constitutional: She appears well-nourished. No distress.  Cardiovascular: Normal rate, regular rhythm and normal heart sounds.   No murmur heard. Pulmonary/Chest: Effort normal and breath sounds normal. No respiratory distress.  Musculoskeletal: She exhibits no edema.  Lymphadenopathy:    She has no cervical adenopathy.    Neurological: She is alert. She exhibits normal muscle tone.  Psychiatric: Her behavior is normal.   She does have osteoarthritis noted in her joints mainly in her knees in addition to this she also has hammertoes plus also her toes are cool to the touch slightly cyanotic but I told her that she needs to wear socks on her feet to keep her toes warm.   No swelling in the hands noted.    Assessment & Plan:  #1 I do not want to change her to Robaxin. I don't believe that this medicine would necessarily help also I am concerned that it could cause significant side effects. Sometimes can be abused although this patient does not appear to be drug abusing.  #2 osteoarthritis she uses oxycodone periodically but not frequently  #3 she has significant orthopedic problems with her toes makes it difficult for her to wear certain shoes she is working with a podiatrist. Her arterial Dopplers were normal  #4 ADD stable prescriptions given #5 arthralgias referral to rheumatologist. I don't really feel there is anything else we can offer. Patient is able to work part-time currently but she states it l chills her to stand on her feet because her foot pain  Bactroban prescribed for the cracks in her toes she is lotion her feet daily

## 2013-08-19 ENCOUNTER — Other Ambulatory Visit: Payer: Self-pay | Admitting: Family Medicine

## 2013-08-19 NOTE — Telephone Encounter (Signed)
I am uncertain why she is on this particular medication at the dose she is taking. Please confirm with the pharmacy and find out from them how long she has been on this particular dose thank you.

## 2013-08-20 NOTE — Telephone Encounter (Signed)
Originally ordered 10/14 with 6 additional refills-arthritis issues

## 2013-08-23 NOTE — Telephone Encounter (Signed)
May prescribed 6 refills

## 2013-11-03 ENCOUNTER — Ambulatory Visit (INDEPENDENT_AMBULATORY_CARE_PROVIDER_SITE_OTHER): Payer: Medicaid Other | Admitting: Family Medicine

## 2013-11-03 ENCOUNTER — Encounter: Payer: Self-pay | Admitting: Family Medicine

## 2013-11-03 VITALS — BP 142/84 | Ht 67.5 in | Wt 265.0 lb

## 2013-11-03 DIAGNOSIS — G8929 Other chronic pain: Secondary | ICD-10-CM

## 2013-11-03 DIAGNOSIS — M25559 Pain in unspecified hip: Secondary | ICD-10-CM

## 2013-11-03 DIAGNOSIS — M25569 Pain in unspecified knee: Secondary | ICD-10-CM

## 2013-11-03 DIAGNOSIS — F988 Other specified behavioral and emotional disorders with onset usually occurring in childhood and adolescence: Secondary | ICD-10-CM

## 2013-11-03 DIAGNOSIS — M549 Dorsalgia, unspecified: Secondary | ICD-10-CM

## 2013-11-03 MED ORDER — AMPHETAMINE-DEXTROAMPHET ER 30 MG PO CP24: ORAL_CAPSULE | ORAL | Status: DC

## 2013-11-03 MED ORDER — GUANFACINE HCL ER 2 MG PO TB24: 2.0000 mg | ORAL_TABLET | Freq: Every day | ORAL | Status: DC

## 2013-11-03 NOTE — Progress Notes (Signed)
   Subjective:    Patient ID: Lauren Greene, female    DOB: 12/31/1965, 48 y.o.   MRN: [id number]  HPIADD check up. Pt states meds are not as effective as they use to be.  Patient was seen today for ADD checkup. -weight, vital signs reviewed.  The following items were covered. 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 Diagnosed with osteoarthritis. We discussed at length her visit with rheumatoid specialist they find no evidence of rheumatoid feel that is osteoarthritis    Review of Systems  Constitutional: Negative for activity change, appetite change and fatigue.  HENT: Negative for congestion.   Respiratory: Negative for cough.   Cardiovascular: Negative for chest pain.  Gastrointestinal: Negative for abdominal pain.  Musculoskeletal: Positive for arthralgias, back pain, joint swelling and myalgias.  Neurological: Negative for headaches.  Psychiatric/Behavioral: Negative for behavioral problems.       Objective:   Physical Exam  Vitals reviewed. Constitutional: She appears well-nourished. No distress.  Cardiovascular: Normal rate, regular rhythm and normal heart sounds.   No murmur heard. Pulmonary/Chest: Effort normal and breath sounds normal. No respiratory distress.  Musculoskeletal: She exhibits no edema.  Lymphadenopathy:    She has no cervical adenopathy.  Neurological: She is alert. She exhibits normal muscle tone.  Psychiatric: Her behavior is normal.          Assessment & Plan:  ADD -- patient is frustrated and does not feel that her medicine doesn't good as it used to. I told her she is on the top dose of the medication. I would recommend  Intuniv to be added. 2 mg. She will let us know how that is doing if necessary we will increase it.  Chronic arthritis she understands the anti-inflammatories had risk of ulcers as well as kidney problems but she would like to continue these. We will stick with this.  Chronic pain due to arthritis uses oxycodone periodically not frequently.  Patient with fair amount of stress she feels the Effexor is helping somewhat she would like to continue this  25 minutes spent with patient followup in 3 months

## 2013-11-04 ENCOUNTER — Telehealth: Payer: Self-pay | Admitting: Family Medicine

## 2013-11-04 NOTE — Telephone Encounter (Signed)
Rx prior auth APPROVED for pt's GUANFACINE HCl ER 2mg , expires 11/03/2013 through her Medicaid, faxed approval to [facility]

## 2013-12-17 ENCOUNTER — Telehealth: Payer: Self-pay | Admitting: Family Medicine

## 2013-12-17 NOTE — Telephone Encounter (Signed)
Called [facility]-gave NPI# for 6 visits for 1 year

## 2013-12-17 NOTE — Telephone Encounter (Signed)
Pt is needing the NPI, referral auto [facility] to [facility] [place]   Sent to [place]

## 2013-12-17 NOTE — Telephone Encounter (Signed)
appt is set already

## 2013-12-22 ENCOUNTER — Other Ambulatory Visit: Payer: Self-pay | Admitting: Family Medicine

## 2014-01-17 ENCOUNTER — Encounter: Payer: Self-pay | Admitting: Family Medicine

## 2014-01-24 ENCOUNTER — Telehealth: Payer: Self-pay | Admitting: Family Medicine

## 2014-01-24 ENCOUNTER — Ambulatory Visit (INDEPENDENT_AMBULATORY_CARE_PROVIDER_SITE_OTHER): Payer: Medicaid Other | Admitting: Obstetrics & Gynecology

## 2014-01-24 ENCOUNTER — Encounter: Payer: Self-pay | Admitting: Obstetrics & Gynecology

## 2014-01-24 VITALS — BP 150/84 | HR 64 | Resp 16 | Ht 67.0 in | Wt 268.4 lb

## 2014-01-24 DIAGNOSIS — Z0000 Encounter for general adult medical examination without abnormal findings: Secondary | ICD-10-CM

## 2014-01-24 DIAGNOSIS — Z01419 Encounter for gynecological examination (general) (routine) without abnormal findings: Secondary | ICD-10-CM

## 2014-01-24 LAB — POCT URINALYSIS DIPSTICK
Bilirubin, UA: NEGATIVE
Glucose, UA: NEGATIVE
Ketones, UA: NEGATIVE
Leukocytes, UA: NEGATIVE
Nitrite, UA: NEGATIVE
Protein, UA: NEGATIVE
Urobilinogen, UA: NEGATIVE
pH, UA: 5

## 2014-01-24 MED ORDER — ESTRADIOL-NORETHINDRONE ACET 1-0.5 MG PO TABS: 1.0000 | ORAL_TABLET | Freq: Every day | ORAL | Status: DC

## 2014-01-24 MED ORDER — OXYCODONE-ACETAMINOPHEN 10-325 MG PO TABS
1.0000 | ORAL_TABLET | Freq: Four times a day (QID) | ORAL | Status: DC | PRN
Start: 2014-01-24 — End: 2014-01-31

## 2014-01-24 NOTE — Telephone Encounter (Signed)
Last seen 11/03/13

## 2014-01-24 NOTE — Telephone Encounter (Signed)
Patient requesting Rx for percocet.

## 2014-01-24 NOTE — Progress Notes (Signed)
48 y.o. [id number] SingleCaucasianF here for annual exam.  Joints are really bothering her today, especially due to the rain and cold.  Having chronic pain.  No vaginal bleeding in several years.  Risks of breast cancer, DVT/PE, stroke.  Also aware she could have vaginal bleeding.  H/O IUD use which was removed 7/12.   Patient's last menstrual period was 03/19/2003.          Sexually active: No.  The current method of family planning is none.    Exercising: No.  not regularly Smoker:  Former smoker  Health Maintenance: Pap:  01/07/13 WNL/negative HR HPV History of abnormal Pap:  Yes h/o LEEP 1986 MMG:  12/04/12 3D-normal Colonoscopy:  none BMD:  2014, [facility] TDaP:  08/03/13 Screening Labs: 2014, Hb today:02/16/13 12.6, Urine today: done today   reports that she quit smoking about 9 years ago. Her smoking use included Cigarettes. She smoked 0.00 packs per day for 10 years. She has never used smokeless tobacco. She reports that she drinks alcohol. She reports that she does not use illicit drugs.  Past Medical History  Diagnosis Date  . Arthritis   . Migraine   . Back pain   . Thyroid disease     hypothyroidism  . Broken femur 08/27/12  . ADHD (attention deficit hyperactivity disorder)   . Osteoarthritis     Past Surgical History  Procedure Laterality Date  . Total knee arthroplasty      x2  . Heel spur surgery    . Back surgery    . Im nailing femoral shaft fracture Left 08/27/2012    Dr Magnus IvanBLACKMAN  . Femur im nail Left 08/27/2012    Procedure: INTRAMEDULLARY (IM) RETROGRADE FEMORAL NAILING;  Surgeon: Kathryne Hitchhristopher Y Blackman, MD;  Location: [facility] OR;  Service: Orthopedics;  Laterality: Left;  . Cervical biopsy  w/ loop electrode excision  1990's    [place]  . Endometrial biopsy  04-05-05 and 02-12-06    04-05-05 benign but too rapid growth. RXd Provera and repeat Bx 45mo. 02-12-06 Bx benign--Dr. Leda QuailSuzanne Lara Palinkas  . Tubal ligation      Current Outpatient Prescriptions   Medication Sig Dispense Refill  . amphetamine-dextroamphetamine (ADDERALL XR) 30 MG 24 hr capsule Two daily at 1pm (works late) 60 capsule 0  . Diclofenac Sodium CR 100 MG 24 hr tablet TAKE TWO TABLETS BY MOUTH DAILY. 60 tablet 5  . Lansoprazole (PREVACID PO) Take 1 tablet by mouth daily as needed (heartburn).    Marland Kitchen. oxyCODONE-acetaminophen (PERCOCET) 10-325 MG per tablet Take 1 tablet by mouth every 6 (six) hours as needed for pain. 60 tablet 0  . venlafaxine XR (EFFEXOR-XR) 75 MG 24 hr capsule TAKE 3 CAPSULES BY MOUTH DAILY. 90 capsule 5   No current facility-administered medications for this visit.    Family History  Problem Relation Age of Onset  . Cancer Mother   . Breast cancer Mother   . Heart failure Father   . Cancer Father   . Cancer Other   . Heart failure Other     ROS:  Pertinent items are noted in HPI.  Otherwise, a comprehensive ROS was negative.  Exam:   BP 150/84 mmHg  Pulse 64  Resp 16  Ht 5\' 7"  (1.702 m)  Wt 268 lb 6.4 oz (121.745 kg)  BMI 42.03 kg/m2  LMP 03/19/2003  Weight change:   Height: 5\' 7"  (170.2 cm)  Ht Readings from Last 3 Encounters:  01/24/14 5\' 7"  (1.702  m)  11/03/13 5' 7.5" (1.715 m)  08/03/13 5' 7.5" (1.715 m)    General appearance: alert, cooperative and appears stated age Head: Normocephalic, without obvious abnormality, atraumatic Neck: no adenopathy, supple, symmetrical, trachea midline and thyroid normal to inspection and palpation Lungs: clear to auscultation bilaterally Breasts: normal appearance, no masses or tenderness Heart: regular rate and rhythm Abdomen: soft, non-tender; bowel sounds normal; no masses,  no organomegaly Extremities: extremities normal, atraumatic, no cyanosis or edema Skin: Skin color, texture, turgor normal. No rashes or lesions Lymph nodes: Cervical, supraclavicular, and axillary nodes normal. No abnormal inguinal nodes palpated Neurologic: Grossly normal   Pelvic: External genitalia:  no lesions               Urethra:  normal appearing urethra with no masses, tenderness or lesions              Bartholins and Skenes: normal                 Vagina: normal appearing vagina with normal color and discharge, no lesions              Cervix: no lesions              Pap taken: No. Bimanual Exam:  Uterus:  normal size, contour, position, consistency, mobility, non-tender              Adnexa: normal adnexa and no mass, fullness, tenderness               Rectovaginal: Confirms               Anus:  normal sphincter tone, no lesions  A:  Well Woman with normal exam PMP, no HRT H/O hypothyroidism.  Last TSH 06/24/13 was 4/14 with Dr. Gerda DissLuking H/O femur fracture. Followed by [facility]. [facility]. Hot flashes/night sweats. Obesity  P: Mammogram yearly 2014 normal pap with neg HR HPV Trial of [place].  Rx to pharmacy.  #30/13 RFs.  Risks and benefits discussed.  Pt will give update in 4-6 weeks return annually or prn  An After Visit Summary was printed and given to the patient.

## 2014-01-24 NOTE — Telephone Encounter (Signed)
May refill percocet keep OV Nov 16

## 2014-01-25 NOTE — Telephone Encounter (Signed)
Notified patient via VM stating we have the med waiting up front. 

## 2014-01-31 ENCOUNTER — Ambulatory Visit (INDEPENDENT_AMBULATORY_CARE_PROVIDER_SITE_OTHER): Payer: Medicaid Other | Admitting: Family Medicine

## 2014-01-31 ENCOUNTER — Encounter: Payer: Self-pay | Admitting: Family Medicine

## 2014-01-31 VITALS — BP 138/88 | Ht 67.5 in | Wt 267.0 lb

## 2014-01-31 DIAGNOSIS — IMO0001 Reserved for inherently not codable concepts without codable children: Secondary | ICD-10-CM

## 2014-01-31 DIAGNOSIS — F988 Other specified behavioral and emotional disorders with onset usually occurring in childhood and adolescence: Secondary | ICD-10-CM

## 2014-01-31 DIAGNOSIS — M25551 Pain in right hip: Secondary | ICD-10-CM

## 2014-01-31 DIAGNOSIS — M25561 Pain in right knee: Secondary | ICD-10-CM

## 2014-01-31 DIAGNOSIS — M791 Myalgia, unspecified site: Secondary | ICD-10-CM

## 2014-01-31 DIAGNOSIS — F909 Attention-deficit hyperactivity disorder, unspecified type: Secondary | ICD-10-CM

## 2014-01-31 DIAGNOSIS — Z13220 Encounter for screening for lipoid disorders: Secondary | ICD-10-CM

## 2014-01-31 DIAGNOSIS — R030 Elevated blood-pressure reading, without diagnosis of hypertension: Secondary | ICD-10-CM

## 2014-01-31 MED ORDER — AMPHETAMINE-DEXTROAMPHET ER 30 MG PO CP24
ORAL_CAPSULE | ORAL | Status: DC
Start: 2014-01-31 — End: 2014-01-31

## 2014-01-31 MED ORDER — AMPHETAMINE-DEXTROAMPHET ER 30 MG PO CP24: ORAL_CAPSULE | ORAL | Status: DC

## 2014-01-31 MED ORDER — OXYCODONE-ACETAMINOPHEN 10-325 MG PO TABS: 1.0000 | ORAL_TABLET | ORAL | Status: DC | PRN

## 2014-01-31 NOTE — Progress Notes (Signed)
   Subjective:    Patient ID: Lauren Greene, female    DOB: 01/27/1966, 48 y.o.   MRN: [id number]  HPI  Patient is here today for a 3 month check up on her Adderall meds. She needs a refill. She states she's doing well with her medicine and seems to help her focus at work.  She is also in a lot of pain. She was hanging drapes, and her right elbow started to bother her. Then, she was throwing darts, and her right elbow popped, and now it is bruised and painful. Left arm is hurting as well. There is a bruise there too. Pt has been taking 6-8 Percocets per day for the pain. She would like to be referred to a pain specialist d/t the amount of pain she is in.   Fingers will also spasm as well.  Long discussion held regarding her problems. 25 minutes spent with patient. Currently right now Lauren Greene difficult for her to work. She is essentially becoming disabled.  Review of Systems     Objective:   Physical Exam  Lungs clear heart regular neck no masses eardrums normalheart regular patient with orthopedic pain discomfort in the elbows shoulders hips and knees.      Assessment & Plan:  myalgias-will check some lab work. She has spasms that occur in the hands were look at thyroid calcium potassium  Chronic pain discomfort. She states elbow causing some bruising pain discomfort. She denies any injury.  ADD medications written per usual. Follow-up 3 months.  She relates she is having such severe osteoarthritis that she is unable to work this is causing her major issues.   Initially elevated blood pressure when she came in related to pain follow-up within 3 months

## 2014-02-28 ENCOUNTER — Ambulatory Visit (INDEPENDENT_AMBULATORY_CARE_PROVIDER_SITE_OTHER): Payer: Medicaid Other | Admitting: Family Medicine

## 2014-02-28 ENCOUNTER — Telehealth: Payer: Self-pay | Admitting: Obstetrics & Gynecology

## 2014-02-28 ENCOUNTER — Encounter: Payer: Self-pay | Admitting: Family Medicine

## 2014-02-28 VITALS — BP 146/84 | Ht 67.5 in | Wt 265.0 lb

## 2014-02-28 DIAGNOSIS — R030 Elevated blood-pressure reading, without diagnosis of hypertension: Secondary | ICD-10-CM

## 2014-02-28 DIAGNOSIS — IMO0001 Reserved for inherently not codable concepts without codable children: Secondary | ICD-10-CM

## 2014-02-28 NOTE — Telephone Encounter (Signed)
Pt says the medication for hotflashes is not helping and she would like to speak with nurse about changing meds.

## 2014-02-28 NOTE — Progress Notes (Signed)
   Subjective:    Patient ID: Lauren Greene, female    DOB: 08/15/1965, 48 y.o.   MRN: [id number]  HPI Patient is here today for a BP recheck.   She had foot and shoulder surgery. She states she has not been able to be active she states she could be doing a better job watching how she eats She denies misusing her medicines She does try to minimize salt in her diet. She is not been MRI exercise because of orthopedic surgeries She denies any chest tightness pressure pain and headaches. Review of Systems  Constitutional: Negative for activity change, appetite change and fatigue.  Endocrine: Negative for polydipsia and polyphagia.  Genitourinary: Negative for frequency.  Neurological: Negative for weakness.  Psychiatric/Behavioral: Negative for confusion.       Objective:   Physical Exam  Constitutional: She appears well-nourished. No distress.  Cardiovascular: Normal rate, regular rhythm and normal heart sounds.   No murmur heard. Pulmonary/Chest: Effort normal and breath sounds normal. No respiratory distress.  Musculoskeletal: She exhibits no edema.  Lymphadenopathy:    She has no cervical adenopathy.  Neurological: She is alert. She exhibits normal muscle tone.  Psychiatric: Her behavior is normal.  Vitals reviewed.         Assessment & Plan:  HTN-try to minimize salt diet E healthy also she needs lose significant amount of weight. She's had recent surgery she will need do this through dietary measures through portion control and healthy choices. Her bottom number looks good the top number is mildly elevated. She does have a follow-up within 60 days.numbers do not look great she will need to be placed on medication

## 2014-02-28 NOTE — Patient Instructions (Addendum)
DASH Eating Plan DASH stands for "Dietary Approaches to Stop Hypertension." The DASH eating plan is a healthy eating plan that has been shown to reduce high blood pressure (hypertension). Additional health benefits may include reducing the risk of type 2 diabetes mellitus, heart disease, and stroke. The DASH eating plan may also help with weight loss. WHAT DO I NEED TO KNOW ABOUT THE DASH EATING PLAN? For the DASH eating plan, you will follow these general guidelines:  Choose foods with a percent daily value for sodium of less than 5% (as listed on the food label).  Use salt-free seasonings or herbs instead of table salt or sea salt.  Check with your health care provider or pharmacist before using salt substitutes.  Eat lower-sodium products, often labeled as "lower sodium" or "no salt added."  Eat fresh foods.  Eat more vegetables, fruits, and low-fat dairy products.  Choose whole grains. Look for the word "whole" as the first word in the ingredient list.  Choose fish and skinless chicken or turkey more often than red meat. Limit fish, poultry, and meat to 6 oz (170 g) each day.  Limit sweets, desserts, sugars, and sugary drinks.  Choose heart-healthy fats.  Limit cheese to 1 oz (28 g) per day.  Eat more home-cooked food and less restaurant, buffet, and fast food.  Limit fried foods.  Cook foods using methods other than frying.  Limit canned vegetables. If you do use them, rinse them well to decrease the sodium.  When eating at a restaurant, ask that your food be prepared with less salt, or no salt if possible. WHAT FOODS CAN I EAT? Seek help from a dietitian for individual calorie needs. Grains Whole grain or whole wheat bread. Brown rice. Whole grain or whole wheat pasta. Quinoa, bulgur, and whole grain cereals. Low-sodium cereals. Corn or whole wheat flour tortillas. Whole grain cornbread. Whole grain crackers. Low-sodium crackers. Vegetables Fresh or frozen vegetables  (raw, steamed, roasted, or grilled). Low-sodium or reduced-sodium tomato and vegetable juices. Low-sodium or reduced-sodium tomato sauce and paste. Low-sodium or reduced-sodium canned vegetables.  Fruits All fresh, canned (in natural juice), or frozen fruits. Meat and Other Protein Products Ground beef (85% or leaner), grass-fed beef, or beef trimmed of fat. Skinless chicken or turkey. Ground chicken or turkey. Pork trimmed of fat. All fish and seafood. Eggs. Dried beans, peas, or lentils. Unsalted nuts and seeds. Unsalted canned beans. Dairy Low-fat dairy products, such as skim or 1% milk, 2% or reduced-fat cheeses, low-fat ricotta or cottage cheese, or plain low-fat yogurt. Low-sodium or reduced-sodium cheeses. Fats and Oils Tub margarines without trans fats. Light or reduced-fat mayonnaise and salad dressings (reduced sodium). Avocado. Safflower, olive, or canola oils. Natural peanut or almond butter. Other Unsalted popcorn and pretzels. The items listed above may not be a complete list of recommended foods or beverages. Contact your dietitian for more options. WHAT FOODS ARE NOT RECOMMENDED? Grains White bread. White pasta. White rice. Refined cornbread. Bagels and croissants. Crackers that contain trans fat. Vegetables Creamed or fried vegetables. Vegetables in a cheese sauce. Regular canned vegetables. Regular canned tomato sauce and paste. Regular tomato and vegetable juices. Fruits Dried fruits. Canned fruit in light or heavy syrup. Fruit juice. Meat and Other Protein Products Fatty cuts of meat. Ribs, chicken wings, bacon, sausage, bologna, salami, chitterlings, fatback, hot dogs, bratwurst, and packaged luncheon meats. Salted nuts and seeds. Canned beans with salt. Dairy Whole or 2% milk, cream, half-and-half, and cream cheese. Whole-fat or sweetened yogurt. Full-fat   cheeses or blue cheese. Nondairy creamers and whipped toppings. Processed cheese, cheese spreads, or cheese  curds. Condiments Onion and garlic salt, seasoned salt, table salt, and sea salt. Canned and packaged gravies. Worcestershire sauce. Tartar sauce. Barbecue sauce. Teriyaki sauce. Soy sauce, including reduced sodium. Steak sauce. Fish sauce. Oyster sauce. Cocktail sauce. Horseradish. Ketchup and mustard. Meat flavorings and tenderizers. Bouillon cubes. Hot sauce. Tabasco sauce. Marinades. Taco seasonings. Relishes. Fats and Oils Butter, stick margarine, lard, shortening, ghee, and bacon fat. Coconut, palm kernel, or palm oils. Regular salad dressings. Other Pickles and olives. Salted popcorn and pretzels. The items listed above may not be a complete list of foods and beverages to avoid. Contact your dietitian for more information. WHERE CAN I FIND MORE INFORMATION? National Heart, Lung, and Blood Institute: www.nhlbi.nih.gov/health/health-topics/topics/dash/ Document Released: 02/21/2011 Document Revised: 07/19/2013 Document Reviewed: 01/06/2013 ExitCare Patient Information 2015 [employer]. This information is not intended to replace advice given to you by your health care provider. Make sure you discuss any questions you have with your health care provider. Hypertension Hypertension, commonly called high blood pressure, is when the force of blood pumping through your arteries is too strong. Your arteries are the blood vessels that carry blood from your heart throughout your body. A blood pressure reading consists of a higher number over a lower number, such as 110/72. The higher number (systolic) is the pressure inside your arteries when your heart pumps. The lower number (diastolic) is the pressure inside your arteries when your heart relaxes. Ideally you want your blood pressure below 120/80. Hypertension forces your heart to work harder to pump blood. Your arteries may become narrow or stiff. Having hypertension puts you at risk for heart disease, stroke, and other problems.  RISK  FACTORS Some risk factors for high blood pressure are controllable. Others are not.  Risk factors you cannot control include:   Race. You may be at higher risk if you are African American.  Age. Risk increases with age.  Gender. Men are at higher risk than women before age 45 years. After age 65, women are at higher risk than men. Risk factors you can control include:  Not getting enough exercise or physical activity.  Being overweight.  Getting too much fat, sugar, calories, or salt in your diet.  Drinking too much alcohol. SIGNS AND SYMPTOMS Hypertension does not usually cause signs or symptoms. Extremely high blood pressure (hypertensive crisis) may cause headache, anxiety, shortness of breath, and nosebleed. DIAGNOSIS  To check if you have hypertension, your health care provider will measure your blood pressure while you are seated, with your arm held at the level of your heart. It should be measured at least twice using the same arm. Certain conditions can cause a difference in blood pressure between your right and left arms. A blood pressure reading that is higher than normal on one occasion does not mean that you need treatment. If one blood pressure reading is high, ask your health care provider about having it checked again. TREATMENT  Treating high blood pressure includes making lifestyle changes and possibly taking medicine. Living a healthy lifestyle can help lower high blood pressure. You may need to change some of your habits. Lifestyle changes may include:  Following the DASH diet. This diet is high in fruits, vegetables, and whole grains. It is low in salt, red meat, and added sugars.  Getting at least 2 hours of brisk physical activity every week.  Losing weight if necessary.  Not smoking.  Limiting   alcoholic beverages.  Learning ways to reduce stress. If lifestyle changes are not enough to get your blood pressure under control, your health care provider may  prescribe medicine. You may need to take more than one. Work closely with your health care provider to understand the risks and benefits. HOME CARE INSTRUCTIONS  Have your blood pressure rechecked as directed by your health care provider.   Take medicines only as directed by your health care provider. Follow the directions carefully. Blood pressure medicines must be taken as prescribed. The medicine does not work as well when you skip doses. Skipping doses also puts you at risk for problems.   Do not smoke.   Monitor your blood pressure at home as directed by your health care provider. SEEK MEDICAL CARE IF:   You think you are having a reaction to medicines taken.  You have recurrent headaches or feel dizzy.  You have swelling in your ankles.  You have trouble with your vision. SEEK IMMEDIATE MEDICAL CARE IF:  You develop a severe headache or confusion.  You have unusual weakness, numbness, or feel faint.  You have severe chest or abdominal pain.  You vomit repeatedly.  You have trouble breathing. MAKE SURE YOU:   Understand these instructions.  Will watch your condition.  Will get help right away if you are not doing well or get worse. Document Released: 03/04/2005 Document Revised: 07/19/2013 Document Reviewed: 12/25/2012 ExitCare Patient Information 2015 Exit[employer], [employer]. This information is not intended to replace advice given to you by your health care provider. Make sure you discuss any questions you have with your health care provider.  

## 2014-03-01 NOTE — Telephone Encounter (Signed)
Spoke with patient. Patient is currently taking Activella 1-0.5 1 tablet daily. "I am just hot all the time and sweating. I could wear shorts all year round and still be hot. This medicine is just not helping. I need a higher dose or something." Patient was started on Activella on 01/24/14. Advised patient would send a message to Dr.Miller and return call with further recommendations and instructions. Patient is agreeable.

## 2014-03-02 NOTE — Telephone Encounter (Signed)
Left message to call Chanequa Spees at [phone]. 

## 2014-03-02 NOTE — Telephone Encounter (Signed)
Can try estradiol 1mg  BID and provera 5mg  daily.  This is as high I will go with HRT dosage.  If this doesn't help, I do not feel comfortable with any higher dosage.  Can sent rx to pharmacy.

## 2014-03-07 MED ORDER — ESTRADIOL 1 MG PO TABS: 1.0000 mg | ORAL_TABLET | Freq: Two times a day (BID) | ORAL | Status: DC

## 2014-03-07 MED ORDER — MEDROXYPROGESTERONE ACETATE 5 MG PO TABS: 5.0000 mg | ORAL_TABLET | Freq: Every day | ORAL | Status: DC

## 2014-03-07 NOTE — Telephone Encounter (Signed)
Left message to call Dhyana Bastone at [phone]. 

## 2014-03-07 NOTE — Telephone Encounter (Signed)
Spoke with patient. Advised patient of message as seen below from Dr.Miller. Patient is agreeable and verbalizes understanding. Order placed for Estradiol 1mg  BID #60 12RF and Provera 5mg  #30 12RF daily to pharmacy on file.   Routing to provider for final review. Patient agreeable to disposition. Will close encounter

## 2014-04-01 ENCOUNTER — Other Ambulatory Visit: Payer: Self-pay | Admitting: Family Medicine

## 2014-04-27 ENCOUNTER — Telehealth: Payer: Self-pay | Admitting: Obstetrics & Gynecology

## 2014-04-27 DIAGNOSIS — N950 Postmenopausal bleeding: Secondary | ICD-10-CM

## 2014-04-27 NOTE — Telephone Encounter (Signed)
I agree with scheduling an appointment for the patient to see Dr. Hyacinth MeekerMiller for re-evaluation.   Cc - Dr. Hyacinth MeekerMiller

## 2014-04-27 NOTE — Telephone Encounter (Signed)
Patient says she has been in menopause for 9 years and just started bleeding. Last seen 01/24/14.

## 2014-04-27 NOTE — Telephone Encounter (Signed)
Spoke with patient. She states she has been having spotting that has increased to bleeding for 10 days. Changing pad q 2-3 hours. Feels as though bleeding is getting worse. She has been ill with GI virus, Diarrhea and vomiting x 3 days but feels those symptoms are now gone. She states she has not taken any of her HRT since at least 04/25/14.  Patient states she was having increased hot flashes "it looks like I just jogged 10 miles" and that she has been meaning to call our office but has not. Reports that she has trouble remembering to take oral estradiol 1.0 mg two times a day so states that she has been taking two tablets at once and states "I have been taking 2 milligrams at a time." Reports that she has "ran out" of her other prescription ?Provera 5 mg, and states she is waiting to refill. States what she has right now is a blue tablet.   Advised patient to continue to hold medication until she is evaluated by Dr. Hyacinth MeekerMiller and to ensure that what she has on hand and is taking daily is Estrace 1.0 mg PO BID and Provera 5 mg PO BID.   Advised patient would call back with appointment time with Dr. Hyacinth MeekerMiller. Patient states she lives in [place] and is available any time, return call on cell phone.   Routing to Dr. Hyacinth MeekerMiller and Dr. Edward JollySilva (covering provider)

## 2014-04-27 NOTE — Telephone Encounter (Signed)
Pt needs appt.  Thanks

## 2014-04-28 ENCOUNTER — Ambulatory Visit (INDEPENDENT_AMBULATORY_CARE_PROVIDER_SITE_OTHER): Payer: Medicaid Other | Admitting: Obstetrics & Gynecology

## 2014-04-28 VITALS — BP 138/82 | HR 64 | Resp 16 | Wt 254.0 lb

## 2014-04-28 DIAGNOSIS — N950 Postmenopausal bleeding: Secondary | ICD-10-CM

## 2014-04-28 DIAGNOSIS — R319 Hematuria, unspecified: Secondary | ICD-10-CM

## 2014-04-28 MED ORDER — NORETHINDRONE ACETATE 5 MG PO TABS: 10.0000 mg | ORAL_TABLET | Freq: Every day | ORAL | Status: DC

## 2014-04-28 NOTE — Telephone Encounter (Signed)
Spoke with patient. States bleeding has decreased. Reports headache. Advised office visit with Dr. Hyacinth MeekerMiller is indicated. She is agreeable. Scheduled for today at 1315. Routing to provider for final review. Patient agreeable to disposition. Will close encounter

## 2014-04-28 NOTE — Progress Notes (Signed)
Subjective:     Patient ID: Lauren Greene, female   DOB: 08/23/1965, 49 y.o.   MRN: [id number]  HPI 49 yo G3P3 DWF here for episode of PMP bleeding for about three weeks.  Pt was started on HRT after last visit in October when she was here for AEX.  Pt reported at that time she was having worsening hot flashes.  She continues to feel this is menopause related.  I do not as she was on HRT in the past and she had no change in her hot flashes.  She recently increased her Estradiol to 1.0mg  bid and has not noticed any change in hot flashes.  As well, she reports she could not take this BID so some days takes two at a time and sometimes has just missed a dosage.  Then she reports she ran out of Provera and hasn't taken this for about two weeks.  Pt also had a GI issue with diarrhea for several days.  She hasn't had any now in three days and decided it was "safe" to come in a be seen at this point.   Pt is on chronic pain medication and I have wondered if hot flashes is a side effect of this medication for her.  She has had bilateral knee surgery, right femur fracture, right shoulder shoulder and is in chronic pain from this and from osteoarthritis as well.  Pt and her partner and I discussed disposal  Review of Systems  All other systems reviewed and are negative.      Objective:   Physical Exam  Constitutional: She is oriented to person, place, and time. She appears well-developed and well-nourished.  Abdominal: Soft. Bowel sounds are normal.  Genitourinary: Uterus normal. There is no rash, tenderness or lesion on the right labia. There is no rash, tenderness or lesion on the left labia. Right adnexum displays no mass, no tenderness and no fullness. Left adnexum displays no mass, no tenderness and no fullness. There is bleeding in the vagina.  Lymphadenopathy:       Right: No inguinal adenopathy present.       Left: No inguinal adenopathy present.  Neurological: She is alert and oriented to  person, place, and time.  Skin: Skin is warm and dry.  Psychiatric: She has a normal mood and affect.   Endometrial biopsy recommended.  Discussed with patient.  Verbal and written consent obtained.   Procedure:  Speculum placed.  Cervix visualized and cleansed with betadine prep.  A single toothed tenaculum was applied to the anterior lip of the cervix.  Endometrial pipelle was advanced through the cervix into the endometrial cavity without difficulty.  Pipelle passed to 7.5cm.  Suction applied and pipelle removed with good tissue sample obtained.  Tenculum removed.  No bleeding noted.  Patient tolerated procedure well.     Assessment:     PMP bleeding most likely due to missing/mistaking her medication Hot flashes/night sweats/body sweats, doubtful if this is all menopausal symptoms Osteoarthritis and chronic pain Discolored urine, hematuria?    Plan:     Endometrial biopsy pending.  Will call results to pt.    Norethindrone 10mg  bid until bleeding stops.  Then taper to daily.  Rx to pharmacy. Urine micro and culture

## 2014-04-29 LAB — URINALYSIS, MICROSCOPIC ONLY
Bacteria, UA: NONE SEEN
Crystals: NONE SEEN
RBC / HPF: >50 RBC/hpf — AB (ref <3)

## 2014-04-30 LAB — URINE CULTURE: Colony Count: 75000

## 2014-05-01 ENCOUNTER — Encounter: Payer: Self-pay | Admitting: Obstetrics & Gynecology

## 2014-05-02 ENCOUNTER — Telehealth: Payer: Self-pay

## 2014-05-02 MED ORDER — CEPHALEXIN 500 MG PO CAPS: 500.0000 mg | ORAL_CAPSULE | Freq: Four times a day (QID) | ORAL | Status: DC

## 2014-05-02 NOTE — Telephone Encounter (Signed)
Left detailed message on voice mail to call back. Did informe that urine was positive for bacteria and antibiotic will be called in, but patient should call back to discuss results.//kn

## 2014-05-02 NOTE — Telephone Encounter (Signed)
-----   Message from Annamaria BootsMary Suzanne Miller, MD sent at 05/02/2014  1:13 PM EST ----- Please call and inform urine culture + for GBS.  Treatment is Keflex 500mg  qid x 7 days.  Repeat culture and micro 10-14 days.

## 2014-05-09 ENCOUNTER — Encounter: Payer: Self-pay | Admitting: Family Medicine

## 2014-05-09 ENCOUNTER — Ambulatory Visit (INDEPENDENT_AMBULATORY_CARE_PROVIDER_SITE_OTHER): Payer: Medicaid Other | Admitting: Family Medicine

## 2014-05-09 VITALS — BP 126/86 | Ht 68.5 in | Wt 263.0 lb

## 2014-05-09 DIAGNOSIS — F909 Attention-deficit hyperactivity disorder, unspecified type: Secondary | ICD-10-CM

## 2014-05-09 DIAGNOSIS — Z13220 Encounter for screening for lipoid disorders: Secondary | ICD-10-CM

## 2014-05-09 DIAGNOSIS — IMO0001 Reserved for inherently not codable concepts without codable children: Secondary | ICD-10-CM

## 2014-05-09 DIAGNOSIS — R739 Hyperglycemia, unspecified: Secondary | ICD-10-CM

## 2014-05-09 DIAGNOSIS — F988 Other specified behavioral and emotional disorders with onset usually occurring in childhood and adolescence: Secondary | ICD-10-CM

## 2014-05-09 DIAGNOSIS — R030 Elevated blood-pressure reading, without diagnosis of hypertension: Secondary | ICD-10-CM

## 2014-05-09 MED ORDER — AMPHETAMINE-DEXTROAMPHET ER 30 MG PO CP24: ORAL_CAPSULE | ORAL | Status: DC

## 2014-05-09 NOTE — Patient Instructions (Signed)

## 2014-05-09 NOTE — Progress Notes (Signed)
   Subjective:    Patient ID: Lauren Greene, female    DOB: 09/01/1965, 49 y.o.   MRN: [id number]  HPIADD check up.   Pt wants to discuss changing effexor.  She denies problems with that the ADD medicine states it helps her focus stay on track at work. She denies any issues going on currently.   Review of Systems  Constitutional: Negative for activity change, appetite change and fatigue.  Gastrointestinal: Negative for abdominal pain.  Neurological: Negative for headaches.  Psychiatric/Behavioral: Negative for behavioral problems.       Objective:   Physical Exam  Constitutional: She appears well-nourished. No distress.  HENT:  Head: Normocephalic.  Cardiovascular: Normal rate, regular rhythm and normal heart sounds.   No murmur heard. Pulmonary/Chest: Effort normal and breath sounds normal.  Musculoskeletal: She exhibits no edema.  Lymphadenopathy:    She has no cervical adenopathy.  Neurological: She is alert.  Psychiatric: Her behavior is normal.  Vitals reviewed.         Assessment & Plan:  ADD-medications prescriptions given follow-up 3 months  I don't feel the patient is depressed the Effexor seems to be helping her she currently is lacking initiative in the morning of getting up and out of bed we talked about ways to try to motivate herself but I don't think medication changes will help that  She is due for lab work that she did not get done she was given papers again

## 2014-05-16 ENCOUNTER — Ambulatory Visit (INDEPENDENT_AMBULATORY_CARE_PROVIDER_SITE_OTHER): Payer: Medicaid Other | Admitting: *Deleted

## 2014-05-16 VITALS — BP 138/82 | HR 88 | Temp 98.2°F | Resp 20 | Ht 68.5 in | Wt 261.0 lb

## 2014-05-16 DIAGNOSIS — R319 Hematuria, unspecified: Secondary | ICD-10-CM

## 2014-05-16 NOTE — Progress Notes (Signed)
Patient in today for TOC. Patient states she completed abx and denies any Sx. Advised pt to call if any sx reoccur.

## 2014-05-17 LAB — URINALYSIS, MICROSCOPIC ONLY
Bacteria, UA: NONE SEEN
Casts: NONE SEEN
Crystals: NONE SEEN
Squamous Epithelial / LPF: NONE SEEN

## 2014-05-18 LAB — URINE CULTURE
Colony Count: NO GROWTH
Organism ID, Bacteria: NO GROWTH

## 2014-06-02 ENCOUNTER — Telehealth: Payer: Self-pay | Admitting: Family Medicine

## 2014-06-02 NOTE — Telephone Encounter (Signed)
Pt states to please rush decision on changing med, she states she takes this daily for her pain and feels she'll be unable to function without. [facility] & will need to be delivered

## 2014-06-02 NOTE — Telephone Encounter (Signed)
Pt's Diclofenac Sodium CR 100 MG 24 hr tablet is Non-Preferred on Medicaid PDL, pt must fail 2 preferred meds before this can be approved, please see list of preferred meds in red folder, please advise

## 2014-06-02 NOTE — Telephone Encounter (Signed)
Naprosyn 500 one bid is a strong nsaid and is the safest cardiac wise. Discuss with pt,this is covered. 1 bid #60 6 RF. If refuses option 2 is mobic 15 mg , 1 qd ,#30 5 rf. Once she chooses then make change in epic, keep f/u ov

## 2014-06-03 ENCOUNTER — Other Ambulatory Visit: Payer: Self-pay | Admitting: *Deleted

## 2014-06-03 MED ORDER — NAPROXEN 500 MG PO TABS: 500.0000 mg | ORAL_TABLET | Freq: Two times a day (BID) | ORAL | Status: DC

## 2014-06-03 NOTE — Telephone Encounter (Signed)
LMRC

## 2014-06-03 NOTE — Telephone Encounter (Signed)
Discussed with pt. Pt wants to try naprosyn. Pt states she has tried mobic in the past from a different physician and it did not help any. Med [facility]

## 2014-06-06 NOTE — Telephone Encounter (Signed)
Patient has already come in for TOC.//kn

## 2014-06-14 ENCOUNTER — Telehealth: Payer: Self-pay | Admitting: Family Medicine

## 2014-06-14 MED ORDER — MELOXICAM 15 MG PO TABS: 15.0000 mg | ORAL_TABLET | Freq: Every day | ORAL | Status: DC

## 2014-06-14 NOTE — Telephone Encounter (Signed)
Patient needs a 30 day trial of mobic to fail so can get voltaren covered by medicaid. Patient wants this called into pharmacy

## 2014-06-14 NOTE — Telephone Encounter (Signed)
Rx sent electronically to pharmacy. Patient notified. 

## 2014-06-14 NOTE — Telephone Encounter (Signed)
mobic 15 #30 1 qd,2 refill

## 2014-06-14 NOTE — Telephone Encounter (Signed)
Pt is needing to speak to a nurse about getting mobic called in.  [facility]

## 2014-06-29 ENCOUNTER — Telehealth: Payer: Self-pay | Admitting: Family Medicine

## 2014-06-29 ENCOUNTER — Other Ambulatory Visit: Payer: Self-pay | Admitting: Family Medicine

## 2014-06-29 NOTE — Telephone Encounter (Signed)
Pt has tried the other two meds you suggested, she is not feeling any better Still in quite a bit of pain. Would like to move forward with the diclofenac at this  Point.   Can we send this in to get the ball rolling on the PA side of this med.

## 2014-06-29 NOTE — Telephone Encounter (Signed)
Send in Rx for Voltaren 75 mg , 1 twice a day, #60, 5 refills. I expect this will be declined by the insurance company we will have to go through prior authorizations and hopefully this time it will be approved thank you she has used to other anti-inflammatory medicines without success. His best I can tell we did follow the rules so hopefully this will get approved

## 2014-06-30 NOTE — Telephone Encounter (Signed)
Discussed with pt. Pt states she needs rx for voltaren xr 100mg  one bid. She states this is what she took before that helped.

## 2014-07-01 ENCOUNTER — Other Ambulatory Visit: Payer: Self-pay | Admitting: *Deleted

## 2014-07-01 MED ORDER — DICLOFENAC SODIUM ER 100 MG PO TB24: 100.0000 mg | ORAL_TABLET | Freq: Two times a day (BID) | ORAL | Status: DC

## 2014-07-01 NOTE — Telephone Encounter (Signed)
Med sent to pharm. Pt notified on voicemail.  

## 2014-07-01 NOTE — Telephone Encounter (Signed)
May go ahead and send in prescription for Voltaren XR 100 mg twice a day, #60, 4 refills ( this obviously will end up getting rejected we will have to send in the appeal stating that patient has tried 2 different medications. If it is still not covered there is nothing further we can do)

## 2014-07-22 ENCOUNTER — Other Ambulatory Visit: Payer: Self-pay | Admitting: Family Medicine

## 2014-07-23 LAB — LIPID PANEL W/O CHOL/HDL RATIO
Cholesterol, Total: 151 mg/dL (ref 100–199)
HDL: 45 mg/dL (ref >39)
LDL Calculated: 83 mg/dL (ref 0–99)
Triglycerides: 113 mg/dL (ref 0–149)
VLDL Cholesterol Cal: 23 mg/dL (ref 5–40)

## 2014-07-23 LAB — BASIC METABOLIC PANEL
BUN/Creatinine Ratio: 34 — ABNORMAL HIGH (ref 9–23)
BUN: 18 mg/dL (ref 6–24)
CO2: 24 mmol/L (ref 18–29)
Calcium: 9 mg/dL (ref 8.7–10.2)
Chloride: 103 mmol/L (ref 97–108)
Creatinine, Ser: 0.53 mg/dL — ABNORMAL LOW (ref 0.57–1.00)
GFR calc Af Amer: 129 mL/min/{1.73_m2} (ref >59)
GFR calc non Af Amer: 112 mL/min/{1.73_m2} (ref >59)
Glucose: 79 mg/dL (ref 65–99)
Potassium: 4.6 mmol/L (ref 3.5–5.2)
Sodium: 140 mmol/L (ref 134–144)

## 2014-07-23 LAB — HGB A1C W/O EAG: Hgb A1c MFr Bld: 5.6 % (ref 4.8–5.6)

## 2014-08-03 ENCOUNTER — Encounter: Payer: Self-pay | Admitting: Family Medicine

## 2014-08-03 ENCOUNTER — Ambulatory Visit (INDEPENDENT_AMBULATORY_CARE_PROVIDER_SITE_OTHER): Payer: Medicaid Other | Admitting: Family Medicine

## 2014-08-03 VITALS — BP 130/82 | Ht 68.5 in | Wt 264.4 lb

## 2014-08-03 DIAGNOSIS — M609 Myositis, unspecified: Secondary | ICD-10-CM | POA: Diagnosis not present

## 2014-08-03 DIAGNOSIS — G8929 Other chronic pain: Secondary | ICD-10-CM | POA: Diagnosis not present

## 2014-08-03 DIAGNOSIS — M791 Myalgia: Secondary | ICD-10-CM

## 2014-08-03 DIAGNOSIS — F909 Attention-deficit hyperactivity disorder, unspecified type: Secondary | ICD-10-CM | POA: Diagnosis not present

## 2014-08-03 DIAGNOSIS — Z79899 Other long term (current) drug therapy: Secondary | ICD-10-CM

## 2014-08-03 DIAGNOSIS — R030 Elevated blood-pressure reading, without diagnosis of hypertension: Secondary | ICD-10-CM | POA: Diagnosis not present

## 2014-08-03 DIAGNOSIS — M549 Dorsalgia, unspecified: Secondary | ICD-10-CM | POA: Diagnosis not present

## 2014-08-03 DIAGNOSIS — F988 Other specified behavioral and emotional disorders with onset usually occurring in childhood and adolescence: Secondary | ICD-10-CM

## 2014-08-03 DIAGNOSIS — IMO0001 Reserved for inherently not codable concepts without codable children: Secondary | ICD-10-CM

## 2014-08-03 DIAGNOSIS — M1990 Unspecified osteoarthritis, unspecified site: Secondary | ICD-10-CM | POA: Diagnosis not present

## 2014-08-03 MED ORDER — AMPHETAMINE-DEXTROAMPHET ER 30 MG PO CP24: ORAL_CAPSULE | ORAL | Status: DC

## 2014-08-03 NOTE — Progress Notes (Signed)
   Subjective:    Patient ID: Lauren Greene, female    DOB: 06/08/1965, 49 y.o.   MRN: [id number]  HPI Patient was seen today for ADD checkup. -weight, vital signs reviewed.  The following items were covered. -Compliance with medication : yes   - Eating patterns : good  -sleeping: not good, very restless at night  -Additional issues or questions: Patient states that she has been having a very hard time and stays in pain constantly since falling and breaking her femur 2 years ago. Patient is very emotional and just wants to feel better.   25 minutes was spent with the patient covering all these multiple issues Review of Systems  Constitutional: Negative for activity change, appetite change and fatigue.  Gastrointestinal: Negative for abdominal pain.  Neurological: Negative for headaches.  Psychiatric/Behavioral: Negative for behavioral problems.       Objective:   Physical Exam  Constitutional: She appears well-developed and well-nourished.  HENT:  Head: Normocephalic.  Cardiovascular: Normal rate, regular rhythm and normal heart sounds.   No murmur heard. Pulmonary/Chest: Effort normal and breath sounds normal.  Neurological: She is alert.  Skin: Skin is warm and dry.  Psychiatric: She has a normal mood and affect.  Vitals reviewed.  Here with a cane, she has difficult time getting onto the exam table she relates severe pain with walking on today's exam has moderate to severe tenderness in the back muscles leg muscles are muscles as well.       Assessment & Plan:  Adult ADD-3 prescriptions were given for the patient. She is to follow-up if any ongoing troubles otherwise follow-up in 3 months  Obesity patient watch diet try to lose weight  Myalgias as well as arthralgias recheck lab work. [facility]. I suspect fibromyalgia. I told the patient that the pain medications that the pain management doctor  has her on will not help this type of pain it will only help her back pain and sciatica. I cautioned her not to overuse medication.

## 2014-08-04 LAB — SEDIMENTATION RATE: Sed Rate: 2 mm/hr (ref 0–32)

## 2014-08-04 LAB — HEPATIC FUNCTION PANEL
ALT: 14 IU/L (ref 0–32)
AST: 19 IU/L (ref 0–40)
Albumin: 3.9 g/dL (ref 3.5–5.5)
Alkaline Phosphatase: 102 IU/L (ref 39–117)
Bilirubin Total: 0.3 mg/dL (ref 0.0–1.2)
Bilirubin, Direct: 0.08 mg/dL (ref 0.00–0.40)
Total Protein: 5.9 g/dL — ABNORMAL LOW (ref 6.0–8.5)

## 2014-08-04 LAB — CK: Total CK: 139 U/L (ref 24–173)

## 2014-08-04 LAB — C-REACTIVE PROTEIN: CRP: 1.7 mg/L (ref 0.0–4.9)

## 2014-08-04 LAB — ANA: Anti Nuclear Antibody(ANA): NEGATIVE

## 2014-08-16 DIAGNOSIS — Z029 Encounter for administrative examinations, unspecified: Secondary | ICD-10-CM

## 2014-09-05 ENCOUNTER — Other Ambulatory Visit: Payer: Self-pay | Admitting: Family Medicine

## 2014-10-31 ENCOUNTER — Other Ambulatory Visit: Payer: Self-pay | Admitting: Family Medicine

## 2014-11-01 ENCOUNTER — Encounter: Payer: Self-pay | Admitting: Family Medicine

## 2014-11-01 ENCOUNTER — Other Ambulatory Visit: Payer: Self-pay | Admitting: Family Medicine

## 2014-11-01 ENCOUNTER — Ambulatory Visit (INDEPENDENT_AMBULATORY_CARE_PROVIDER_SITE_OTHER): Payer: Medicaid Other | Admitting: Family Medicine

## 2014-11-01 VITALS — BP 132/88 | Ht 68.5 in | Wt 251.0 lb

## 2014-11-01 DIAGNOSIS — R252 Cramp and spasm: Secondary | ICD-10-CM | POA: Diagnosis not present

## 2014-11-01 DIAGNOSIS — R5383 Other fatigue: Secondary | ICD-10-CM

## 2014-11-01 DIAGNOSIS — R202 Paresthesia of skin: Secondary | ICD-10-CM

## 2014-11-01 DIAGNOSIS — F988 Other specified behavioral and emotional disorders with onset usually occurring in childhood and adolescence: Secondary | ICD-10-CM

## 2014-11-01 DIAGNOSIS — M1990 Unspecified osteoarthritis, unspecified site: Secondary | ICD-10-CM

## 2014-11-01 DIAGNOSIS — G2581 Restless legs syndrome: Secondary | ICD-10-CM | POA: Diagnosis not present

## 2014-11-01 DIAGNOSIS — F909 Attention-deficit hyperactivity disorder, unspecified type: Secondary | ICD-10-CM | POA: Diagnosis not present

## 2014-11-01 MED ORDER — AMPHETAMINE-DEXTROAMPHET ER 30 MG PO CP24: ORAL_CAPSULE | ORAL | Status: DC

## 2014-11-01 MED ORDER — DICLOFENAC SODIUM ER 100 MG PO TB24: 100.0000 mg | ORAL_TABLET | Freq: Two times a day (BID) | ORAL | Status: DC

## 2014-11-01 NOTE — Progress Notes (Signed)
   Subjective:    Patient ID: Lauren Greene, female    DOB: Feb 24, 1966, 49 y.o.   MRN: [id number]  HPIADD checkup. Uses med without issue. No side effects, helping her.  Pt needs refills on diclofenac and adderall. Has arthralgia daily Has muscle spasms in hands and feet  Difficulty with sleep issue, ?restless legs?  Pt wants to get referral to neurology. Pt states she does not want to keep taking pain meds on a daily basis. Goes to pain specialist. Pt wants to be tested for MS. Pt states she has severe muscle spasms.    greater than 25 minutes was spent talking with this patient about her joint pain muscle pains soreness as well as intermittent numbness in the hands as well as difficulty sleeping restlessness in her sleep as well. I believe patient will need to see neurology for further workup. I suspect more than likely fibromyalgia with mild arthralgias possible restless legs possible sleep apnea greater than 25 minutes  Review of Systems  Constitutional: Negative for activity change, appetite change and fatigue.  Gastrointestinal: Negative for abdominal pain.  Neurological: Negative for headaches.  Psychiatric/Behavioral: Negative for behavioral problems.       Objective:   Physical Exam  Constitutional: She appears well-developed and well-nourished.  HENT:  Head: Normocephalic.  Cardiovascular: Normal rate, regular rhythm and normal heart sounds.   No murmur heard. Pulmonary/Chest: Effort normal and breath sounds normal.  Neurological: She is alert.  Skin: Skin is warm and dry.  Psychiatric: She has a normal mood and affect.  Vitals reviewed.         Assessment & Plan:   ADD-refills of medication was given. Seems to tolerate this well not causing problems   Significant arthralgia issues takes the medication. Apparently this is the only medicine that has helped her in the past severe for limits stick with it for now    she does have some difficulty with sleeping I  doubt sleep apnea or could be some rest legs but patient is overweight has multiple risk factors for sleep apnea and does have daytime fatigue I do believe she would benefit from seeing neurology for further evaluation possible sleep study    patient has intermittent numbness in both hands sometimes in the arm sometimes in the legs she is worried about MS I doubt MS. CAT scan from 2 years ago did not show any lesions we will get neurologist opinion. Would not surprise me if MRI would be necessary I do believe there is some level carpal tunnel nerve conduction studies on the arms recommended   because of muscle cramp issues will check thyroid and electrolytes patient follow-up in approximately 3 months

## 2014-12-01 ENCOUNTER — Ambulatory Visit: Payer: Medicaid Other | Admitting: Neurology

## 2015-01-03 ENCOUNTER — Telehealth: Payer: Self-pay | Admitting: *Deleted

## 2015-01-03 NOTE — Telephone Encounter (Signed)
I have spoken with Lauren Greene this morning.  She sts. she was going to have to cancel her 10/20 appt. with RAS due to lack of insurance.  Sts. she will have ins. after 01-17-15.  I offered appt. at 1020 on 11/4, but she requested later appt. (later in the day), so appt. given at 1320 on 11-8/fim

## 2015-01-03 NOTE — Telephone Encounter (Signed)
LMTC.  She has an appt. with RAS on 10-20 at 1320, and he will be out of the office at that time.  I would like to speak with her to work her back into the schedule somewhere else./fim

## 2015-01-05 ENCOUNTER — Ambulatory Visit: Payer: Medicaid Other | Admitting: Neurology

## 2015-01-24 ENCOUNTER — Ambulatory Visit: Payer: Self-pay | Admitting: Neurology

## 2015-01-25 ENCOUNTER — Encounter: Payer: Self-pay | Admitting: Neurology

## 2015-02-21 ENCOUNTER — Ambulatory Visit: Payer: Medicaid Other | Admitting: Family Medicine

## 2015-03-30 DIAGNOSIS — Z029 Encounter for administrative examinations, unspecified: Secondary | ICD-10-CM

## 2015-04-04 ENCOUNTER — Ambulatory Visit: Payer: Medicaid Other | Admitting: Obstetrics & Gynecology

## 2015-06-13 ENCOUNTER — Ambulatory Visit: Payer: Medicaid Other | Admitting: Obstetrics & Gynecology

## 2015-06-19 ENCOUNTER — Encounter: Payer: Self-pay | Admitting: Family Medicine

## 2015-06-21 ENCOUNTER — Ambulatory Visit (INDEPENDENT_AMBULATORY_CARE_PROVIDER_SITE_OTHER): Payer: BLUE CROSS/BLUE SHIELD | Admitting: Family Medicine

## 2015-06-21 ENCOUNTER — Encounter: Payer: Self-pay | Admitting: Family Medicine

## 2015-06-21 VITALS — BP 132/80 | Ht 68.5 in | Wt 289.1 lb

## 2015-06-21 DIAGNOSIS — M549 Dorsalgia, unspecified: Secondary | ICD-10-CM | POA: Diagnosis not present

## 2015-06-21 DIAGNOSIS — M545 Low back pain, unspecified: Secondary | ICD-10-CM

## 2015-06-21 DIAGNOSIS — F988 Other specified behavioral and emotional disorders with onset usually occurring in childhood and adolescence: Secondary | ICD-10-CM

## 2015-06-21 DIAGNOSIS — M25569 Pain in unspecified knee: Secondary | ICD-10-CM | POA: Diagnosis not present

## 2015-06-21 DIAGNOSIS — G8929 Other chronic pain: Secondary | ICD-10-CM | POA: Diagnosis not present

## 2015-06-21 DIAGNOSIS — Z79899 Other long term (current) drug therapy: Secondary | ICD-10-CM

## 2015-06-21 DIAGNOSIS — M25559 Pain in unspecified hip: Secondary | ICD-10-CM

## 2015-06-21 DIAGNOSIS — E785 Hyperlipidemia, unspecified: Secondary | ICD-10-CM

## 2015-06-21 DIAGNOSIS — F909 Attention-deficit hyperactivity disorder, unspecified type: Secondary | ICD-10-CM

## 2015-06-21 MED ORDER — DICLOFENAC SODIUM ER 100 MG PO TB24: 100.0000 mg | ORAL_TABLET | Freq: Two times a day (BID) | ORAL | Status: DC

## 2015-06-21 MED ORDER — VENLAFAXINE HCL ER 75 MG PO CP24: ORAL_CAPSULE | ORAL | Status: DC

## 2015-06-21 MED ORDER — AMPHETAMINE-DEXTROAMPHET ER 30 MG PO CP24: ORAL_CAPSULE | ORAL | Status: DC

## 2015-06-21 MED ORDER — OXYCODONE-ACETAMINOPHEN 10-325 MG PO TABS: 1.0000 | ORAL_TABLET | Freq: Four times a day (QID) | ORAL | Status: DC

## 2015-06-21 NOTE — Progress Notes (Signed)
   Subjective:    Patient ID: Lauren Greene, female    DOB: 09/28/1965, 50 y.o.   MRN: [id number]  HPI Patient was seen today for ADD checkup. -weight, vital signs reviewed.  The following items were covered. -Compliance with medication : patient not currently on adderall due to insurance issues.   -Problems with focusing: yes  - Eating patterns : not good  -sleeping: not good  -Additional issues or questions: Patient states that her pain is very bad, her depression has increased and she has gained over 30 lbs since her last visit. Patient is crying and very emotional in the room.   Patient states when she is at work is very difficult for her to focus and stay on track because of no medicine. She works approximate 56 days a week she also states when she doesn't take the medication for ADD she has restlessness difficulty focusing difficulty following through medication helps her greatly this is been the case for years. Patient had ADD symptoms as a child but at that time they did not treat with medication Patient with severe pain issues unable to stick with pain specialist because of lack of insurance now she has insurance she would like to restart pain medicine it did help her before she denies abusing Review of Systems Pain and discomfort in the low back. Pain and discomfort in the hips as well as shoulders.    Objective:   Physical Exam Lungs are clear hearts regular extremities no edema skin warm dry neurologic grossly normal subjective pain discomfort in the hips shoulders and low back   25 minutes was spent with the patient. Greater than half the time was spent in discussion and answering questions and counseling regarding the issues that the patient came in for today.    she will do lab before next office visit Assessment & Plan:  ADHD-has not been on medication in several months because of loss of insurance restart medication. Patient has always done better on a larger dose.  30 mg 2 every morning. Follow-up in 4 weeks to check effectiveness and recheck blood pressure  Chronic low back pain progressively worse patient with significant obesity because of osteoarthritis cannot exercise on a regular basis tries watch her diet to some degree in is unable to exercise much her weight has been increasing causing more back pain paced states back pain unbearable she cries a lot with it we will do lumbar x-rays. Also referral to orthopedic back specialists I'm not sure what they can offer.  Chronic pain related to her shoulders hips and back related to osteoarthritis take anti-inflammatory on a regular basis in addition to this pain medication Percocet 10 mg/325-she is been on pain medicine long-term I recommend pain medication 4 times daily as needed caution drowsiness #120-follow-up in 4 weeks to check effectiveness.  Depression-patient is a take her Effexor on a regular basis she's not suicidal she feels that if she get her pain ADHD under better control she will be able to get the depression under better control when she follows up in 4 weeks if not doing better then I highly recommend for the patient to potentially be on additional medicine potentially see psychiatry

## 2015-06-28 ENCOUNTER — Telehealth: Payer: Self-pay | Admitting: Family Medicine

## 2015-06-28 NOTE — Telephone Encounter (Signed)
Rx prior auth APPROVED for pt's amphetamine-dextroamphetamine (ADDERALL XR) 30 MG 24 hr capsule (2 capsules daily)  Valid 06/22/15-06/20/16, Ref# [id number] [facility]

## 2015-07-06 ENCOUNTER — Ambulatory Visit (HOSPITAL_COMMUNITY)
Admission: RE | Admit: 2015-07-06 | Discharge: 2015-07-06 | Disposition: A | Discharge status: Home / Self Care | Payer: BLUE CROSS/BLUE SHIELD | Source: Ambulatory Visit | Attending: Family Medicine | Admitting: Family Medicine

## 2015-07-06 DIAGNOSIS — G8929 Other chronic pain: Secondary | ICD-10-CM | POA: Insufficient documentation

## 2015-07-06 DIAGNOSIS — M549 Dorsalgia, unspecified: Secondary | ICD-10-CM | POA: Diagnosis present

## 2015-07-06 DIAGNOSIS — M5136 Other intervertebral disc degeneration, lumbar region: Secondary | ICD-10-CM | POA: Diagnosis not present

## 2015-07-07 LAB — LIPID PANEL
Chol/HDL Ratio: 3.2 ratio units (ref 0.0–4.4)
Cholesterol, Total: 138 mg/dL (ref 100–199)
HDL: 43 mg/dL (ref >39)
LDL Calculated: 75 mg/dL (ref 0–99)
Triglycerides: 101 mg/dL (ref 0–149)
VLDL Cholesterol Cal: 20 mg/dL (ref 5–40)

## 2015-07-07 LAB — BASIC METABOLIC PANEL
BUN/Creatinine Ratio: 26 — ABNORMAL HIGH (ref 9–23)
BUN: 14 mg/dL (ref 6–24)
CO2: 22 mmol/L (ref 18–29)
Calcium: 9 mg/dL (ref 8.7–10.2)
Chloride: 105 mmol/L (ref 96–106)
Creatinine, Ser: 0.54 mg/dL — ABNORMAL LOW (ref 0.57–1.00)
GFR calc Af Amer: 127 mL/min/{1.73_m2} (ref >59)
GFR calc non Af Amer: 110 mL/min/{1.73_m2} (ref >59)
Glucose: 86 mg/dL (ref 65–99)
Potassium: 4.2 mmol/L (ref 3.5–5.2)
Sodium: 144 mmol/L (ref 134–144)

## 2015-07-20 ENCOUNTER — Ambulatory Visit (INDEPENDENT_AMBULATORY_CARE_PROVIDER_SITE_OTHER): Payer: BLUE CROSS/BLUE SHIELD | Admitting: Family Medicine

## 2015-07-20 ENCOUNTER — Encounter: Payer: Self-pay | Admitting: Family Medicine

## 2015-07-20 VITALS — BP 144/88 | Ht 68.5 in | Wt 288.0 lb

## 2015-07-20 DIAGNOSIS — F909 Attention-deficit hyperactivity disorder, unspecified type: Secondary | ICD-10-CM | POA: Diagnosis not present

## 2015-07-20 DIAGNOSIS — M545 Low back pain, unspecified: Secondary | ICD-10-CM

## 2015-07-20 DIAGNOSIS — F988 Other specified behavioral and emotional disorders with onset usually occurring in childhood and adolescence: Secondary | ICD-10-CM

## 2015-07-20 MED ORDER — OXYCODONE-ACETAMINOPHEN 10-325 MG PO TABS: 1.0000 | ORAL_TABLET | Freq: Four times a day (QID) | ORAL | Status: DC

## 2015-07-20 MED ORDER — AMPHETAMINE-DEXTROAMPHET ER 30 MG PO CP24: ORAL_CAPSULE | ORAL | Status: DC

## 2015-07-20 NOTE — Progress Notes (Signed)
   Subjective:    Patient ID: Lauren Greene, female    DOB: 02/05/1966, 50 y.o.   MRN: [id number]  Depression        This is a new problem. started on effexor 4 weeks ago. Pt states med is helping.   Pt would like rx for voltaren 2% gel. Wants to use for arthritis.  Patient states her arthritis been bothering her pain medicine helps but she would like to try something help in addition to this  She relates that her moods are doing much better on the Effexor she would like to continue this. She also relates her ADD doing much better on current medication is helping her with focus. Chronic pain medicine keeps the pain in a reasonable range   Review of Systems  Psychiatric/Behavioral: Positive for depression.       Objective:   Physical Exam  Significant osteoarthritis in her knees elbows hips chronic low back pain with subjective tenderness lungs clear heart regular  Follow-up in 3 months    Assessment & Plan:  ADD doing good on her current medicines 3 prescriptions were given  Chronic pain it is doing adequate for her to help with her arthritis and chronic low back pain  Patient is trying to lose weight try to watch diet she was counseled to do so  She may use Voltaren 2% pharmacist spoke with they stated they would try to get it for her she states it's helped her in the past she tried 1% but she would like to try the 2% recent renal functions have been good

## 2015-07-20 NOTE — Patient Instructions (Signed)
Dear Patient,  It has been recommended to you that you have a colonoscopy. It is your responsibility to carry through with this recommendation.   Did you realize that colon cancer is the second leading cancer killer in the [place]. One in every 20 adults will get colon cancer. If all adults would go through the recommended screening for colon cancer (getting a colonoscopy), then there would be a 60% reduction in the number of people dying from colon cancer.  Colon cancer just doesn't come out of the blue. It starts off as a small polyp which over time grows into a cancer. A colonoscopy can prevent cancer and in many cases detected when it is at a very treatable phase. Small colon cancers can have cure rates of 95%. Advanced colon cancer, which often occurs in people who do not do their screenings, have cure rates less than 20%. The risk of colon cancer advances with age. Most adults should have regular colonoscopies every 10 years starting at age 50. This recommendation can vary depending on a person's medical history.  Health-care laws now allow for you to call the gastroenterologist office directly in order to set yourself up for this very important tests. Today we have recommended to you that you do this test. This test may save your life. Failure to do this test puts you at risk for premature death from colon cancer. Do the right thing and schedule this test now.  Here as a list of specialists we recommend in the surrounding area. When you call their office let them know that you are a patient of our practice in your interested in doing a screening colonoscopy. They should assist you without problems. You will need the following information when you called them: 1-name of which Dr. you see, 2-your insurance information, 3-a list of medications that you currently take, 4-any allergies you have to medications.  [place] gastroenterologist Dr. Mike Rourk, Dr Sandi Fields   [place]  gastroenterologist   [phone]  Dr.Najeeb Rehman  [facility]   [phone]  [place] [facility] (Dr. Perry, N, Stark, Brodie, Gesner, Jacobs and Pyrtle) [phone]  [facility] (Dr. Buscemi, Edwards, Hayes, Maygod,Outlaw,Schooler) [phone]  Each group of specialists has assured us that when you called them they will help you get your colonoscopy set up. Should you have problems or if the GI practice insist a referral be done please let us know. Be sure to call soon. Sincerely, Carolyn Hoskins, Dr Steve Lis Savitt, Dr.Clancy Mullarkey    

## 2015-07-25 ENCOUNTER — Telehealth: Payer: Self-pay | Admitting: Obstetrics & Gynecology

## 2015-07-25 ENCOUNTER — Ambulatory Visit: Payer: Medicaid Other | Admitting: Obstetrics & Gynecology

## 2015-07-25 NOTE — Telephone Encounter (Signed)
Patient called at 2:45pm today regarding her 2:00pm appointment that she missed. Patient said " I was stuck at another doctor appointment, may I please reschedule?'  Patient rescheduled to May 22nd at 2:00pm.

## 2015-07-25 NOTE — Telephone Encounter (Signed)
Thank you.  Ok to close encounter. 

## 2015-08-07 ENCOUNTER — Other Ambulatory Visit: Payer: Self-pay | Admitting: Family Medicine

## 2015-08-07 ENCOUNTER — Encounter: Payer: Self-pay | Admitting: Obstetrics & Gynecology

## 2015-08-07 ENCOUNTER — Ambulatory Visit (INDEPENDENT_AMBULATORY_CARE_PROVIDER_SITE_OTHER): Payer: BLUE CROSS/BLUE SHIELD | Admitting: Obstetrics & Gynecology

## 2015-08-07 VITALS — BP 138/86 | HR 74 | Resp 18 | Ht 66.25 in | Wt 285.0 lb

## 2015-08-07 DIAGNOSIS — Z1211 Encounter for screening for malignant neoplasm of colon: Secondary | ICD-10-CM

## 2015-08-07 DIAGNOSIS — R822 Biliuria: Secondary | ICD-10-CM | POA: Diagnosis not present

## 2015-08-07 DIAGNOSIS — Z01419 Encounter for gynecological examination (general) (routine) without abnormal findings: Secondary | ICD-10-CM | POA: Diagnosis not present

## 2015-08-07 DIAGNOSIS — Z1239 Encounter for other screening for malignant neoplasm of breast: Secondary | ICD-10-CM | POA: Diagnosis not present

## 2015-08-07 DIAGNOSIS — Z124 Encounter for screening for malignant neoplasm of cervix: Secondary | ICD-10-CM

## 2015-08-07 DIAGNOSIS — Z0000 Encounter for general adult medical examination without abnormal findings: Secondary | ICD-10-CM

## 2015-08-07 LAB — POCT URINALYSIS DIPSTICK
Blood, UA: NEGATIVE
Glucose, UA: NEGATIVE
Ketones, UA: NEGATIVE
Leukocytes, UA: NEGATIVE
Nitrite, UA: NEGATIVE
Urobilinogen, UA: NEGATIVE
pH, UA: 5

## 2015-08-07 MED ORDER — CLOBETASOL PROPIONATE 0.05 % EX OINT: 1.0000 "application " | TOPICAL_OINTMENT | Freq: Two times a day (BID) | CUTANEOUS | Status: DC

## 2015-08-07 NOTE — Addendum Note (Signed)
Addended by: Jerene BearsMILLER, Maximilien Hayashi S on: 08/07/2015 04:37 PM   Modules accepted: Orders, SmartSet

## 2015-08-07 NOTE — Addendum Note (Signed)
Addended by: Jerene BearsMILLER, Sueo Cullen S on: 08/07/2015 03:24 PM   Modules accepted: Orders, SmartSet

## 2015-08-07 NOTE — Progress Notes (Addendum)
50 y.o. [id number] SingleCaucasianF here for annual exam.  Reports children are doing really well.  Middle child (daughter) got married this past year.  She's been with her husband for five years.  Started dating when they were 51 yo.  Wedding was September, 2016.    No vaginal bleeding.   Biggest complaint is still sweating.  Has been on HRT without success.  Has chronic leg pain.  On pain medication, chronically.  Sees Dr. Gerda Diss, regularly.  Last visit was 07/20/15.  Patient's last menstrual period was 03/19/2003.          Sexually active: No.  The current method of family planning is none.    Exercising: No.  The patient does not participate in regular exercise at present. Smoker:  no  Health Maintenance: Pap:  01/07/2013 negative History of abnormal Pap:  yes MMG:  12/04/2012 BIRADS 1 negative Colonoscopy:  none BMD:   2014  TDaP:  08/03/13  Screening Labs: PCP, Hb today: PCP, Urine today: pt unable to void at this time   reports that she quit smoking about 11 years ago. Her smoking use included Cigarettes. She quit after 10 years of use. She has never used smokeless tobacco. She reports that she drinks alcohol. She reports that she does not use illicit drugs.  Past Medical History  Diagnosis Date  . Arthritis   . Migraine   . Back pain   . Thyroid disease     hypothyroidism  . Broken femur (HCC) 08/27/12  . ADHD (attention deficit hyperactivity disorder)   . Osteoarthritis     Past Surgical History  Procedure Laterality Date  . Total knee arthroplasty      x2  . Heel spur surgery    . Back surgery    . Im nailing femoral shaft fracture Left 08/27/2012    Dr Magnus Ivan  . Femur im nail Left 08/27/2012    Procedure: INTRAMEDULLARY (IM) RETROGRADE FEMORAL NAILING;  Surgeon: Kathryne Hitch, MD;  Location: [facility] OR;  Service: Orthopedics;  Laterality: Left;  . Cervical biopsy  w/ loop electrode excision  1990's    in [place], [place]  . Endometrial biopsy  04-05-05 and  02-12-06    04-05-05 benign but too rapid growth. RXd Provera and repeat Bx 45mo. 02-12-06 Bx benign--Dr. Leda Quail  . Tubal ligation      Current Outpatient Prescriptions  Medication Sig Dispense Refill  . amphetamine-dextroamphetamine (ADDERALL XR) 30 MG 24 hr capsule 2 qam 60 capsule 0  . Diclofenac Sodium CR (VOLTAREN-XR) 100 MG 24 hr tablet Take 1 tablet (100 mg total) by mouth 2 (two) times daily. 60 tablet 4  . Lansoprazole (PREVACID PO) Take 1 tablet by mouth daily as needed (heartburn).    . methocarbamol (ROBAXIN) 750 MG tablet Take 750 mg by mouth 2 (two) times daily as needed for muscle spasms.    . OxyCODONE HCl (OXYCONTIN PO) Take by mouth.    . OXYCODONE HCL PO Take by mouth. 10/325    . oxyCODONE-acetaminophen (PERCOCET) 10-325 MG tablet Take 1 tablet by mouth 4 (four) times daily. 120 tablet 0  . venlafaxine XR (EFFEXOR-XR) 75 MG 24 hr capsule TAKE 3 CAPSULES BY MOUTH DAILY. 90 capsule 2   No current facility-administered medications for this visit.    Family History  Problem Relation Age of Onset  . Cancer Mother   . Breast cancer Mother   . Heart failure Father   . Cancer Father   . Cancer Other   .  Heart failure Other     ROS:  Pertinent items are noted in HPI.  Otherwise, a comprehensive ROS was negative.  Exam:   Filed Vitals:   08/07/15 1429  BP: 138/86  Pulse: 74  Resp: 18  Height: 5' 6.25" (1.683 m)  Weight: 285 lb (129.275 kg)  Wt increase since 01/24/14 is 17#  General appearance: alert, cooperative and appears stated age Head: Normocephalic, without obvious abnormality, atraumatic Neck: no adenopathy, supple, symmetrical, trachea midline and thyroid normal to inspection and palpation Lungs: clear to auscultation bilaterally Breasts: normal appearance, no masses or tenderness Heart: regular rate and rhythm Abdomen: soft, non-tender; bowel sounds normal; no masses,  no organomegaly Extremities: extremities normal, atraumatic, no cyanosis or  edema Skin: Skin color, texture, turgor normal. No rashes or lesions Lymph nodes: Cervical, supraclavicular, and axillary nodes normal. No abnormal inguinal nodes palpated Neurologic: Grossly normal   Pelvic: External genitalia:  no lesions              Urethra:  normal appearing urethra with no masses, tenderness or lesions              Bartholins and Skenes: normal                 Vagina: normal appearing vagina with normal color and discharge, no lesions              Cervix: no lesions              Pap taken: Yes.   Bimanual Exam:  Uterus:  normal size, contour, position, consistency, mobility, non-tender              Adnexa: normal adnexa and no mass, fullness, tenderness               Rectovaginal: Confirms               Anus:  normal sphincter tone, no lesions  Chaperone was present for exam.  A:  Well Woman with normal exam PMP, no HRT Morbid obesity  H/O hypothyroidism. Followed by Dr. Gerda DissLuking H/O femur fracture. Followed by Dr. Thurston HoleWainer. Hot flashes/night sweats Morbid obesity  P: Mammogram yearly.  Pt is overdue.  Will schedule this for pt as she is almost 3 years overdue. 2014 normal pap with neg HR HPV.  Pap today. Labs with Dr. Gerda DissLuking.  Vaccines up to date. Referral to Dr. Loreta AveMann for screening colonoscopy was placed today Urine micro due to bilirubin noted on dip u/a return annually or prn

## 2015-08-07 NOTE — Addendum Note (Signed)
Addended by: Arnold LongALDWELL, Delrae Hagey T on: 08/07/2015 05:08 PM   Modules accepted: Orders, SmartSet

## 2015-08-08 ENCOUNTER — Telehealth: Payer: Self-pay | Admitting: *Deleted

## 2015-08-08 LAB — IPS PAP TEST WITH REFLEX TO HPV

## 2015-08-08 NOTE — Telephone Encounter (Signed)
Left message with patient per DPR stating that mamm[facility] has been [facility] for her on Thursday June 1st, 2017 [facility]. Instructed patient to call with any questions.

## 2015-08-16 ENCOUNTER — Encounter: Payer: Self-pay | Admitting: *Deleted

## 2015-08-16 DIAGNOSIS — M1990 Unspecified osteoarthritis, unspecified site: Secondary | ICD-10-CM | POA: Insufficient documentation

## 2015-08-17 ENCOUNTER — Ambulatory Visit
Admission: RE | Admit: 2015-08-17 | Discharge: 2015-08-17 | Disposition: A | Discharge status: Home / Self Care | Payer: BLUE CROSS/BLUE SHIELD | Source: Ambulatory Visit | Attending: Obstetrics & Gynecology | Admitting: Obstetrics & Gynecology

## 2015-08-17 DIAGNOSIS — Z1239 Encounter for other screening for malignant neoplasm of breast: Secondary | ICD-10-CM

## 2015-08-18 ENCOUNTER — Telehealth: Payer: Self-pay

## 2015-08-18 NOTE — Telephone Encounter (Signed)
Patient's voltaren gel was denied. See form in folder in your office. Please advise.

## 2015-08-20 NOTE — Telephone Encounter (Signed)
I reviewed over the information that was put into my folder. [employer]/insurance provider/has stated that they will not cover Voltaren gel when a patient is also on oral anti-inflammatories. That's just the way it is. There is no additional appeal of this. Informed the patient thank you(don't be surprised if the patient pushes back-it is their right to appeal the insurance company if they wish) thank you this material was returned to the nurses station in case need to reference it, would be a good idea to review over it before speaking with the patient

## 2015-08-21 NOTE — Telephone Encounter (Signed)
Left message to return call 

## 2015-08-23 NOTE — Telephone Encounter (Signed)
Left message to return call 

## 2015-08-23 NOTE — Telephone Encounter (Signed)
Patient notified and verbalized understanding. 

## 2015-10-10 ENCOUNTER — Other Ambulatory Visit: Payer: Self-pay | Admitting: Family Medicine

## 2015-10-20 ENCOUNTER — Ambulatory Visit: Payer: BLUE CROSS/BLUE SHIELD | Admitting: Family Medicine

## 2015-10-24 ENCOUNTER — Encounter: Payer: Self-pay | Admitting: Family Medicine

## 2015-10-24 ENCOUNTER — Ambulatory Visit (INDEPENDENT_AMBULATORY_CARE_PROVIDER_SITE_OTHER): Payer: BLUE CROSS/BLUE SHIELD | Admitting: Family Medicine

## 2015-10-24 VITALS — BP 128/86 | Ht 66.25 in | Wt 286.6 lb

## 2015-10-24 DIAGNOSIS — G8929 Other chronic pain: Secondary | ICD-10-CM | POA: Diagnosis not present

## 2015-10-24 DIAGNOSIS — M5431 Sciatica, right side: Secondary | ICD-10-CM

## 2015-10-24 DIAGNOSIS — L74519 Primary focal hyperhidrosis, unspecified: Secondary | ICD-10-CM

## 2015-10-24 DIAGNOSIS — F909 Attention-deficit hyperactivity disorder, unspecified type: Secondary | ICD-10-CM | POA: Diagnosis not present

## 2015-10-24 DIAGNOSIS — M549 Dorsalgia, unspecified: Secondary | ICD-10-CM | POA: Diagnosis not present

## 2015-10-24 DIAGNOSIS — R61 Generalized hyperhidrosis: Secondary | ICD-10-CM

## 2015-10-24 DIAGNOSIS — F419 Anxiety disorder, unspecified: Secondary | ICD-10-CM | POA: Diagnosis not present

## 2015-10-24 DIAGNOSIS — F988 Other specified behavioral and emotional disorders with onset usually occurring in childhood and adolescence: Secondary | ICD-10-CM

## 2015-10-24 HISTORY — DX: Generalized hyperhidrosis: R61

## 2015-10-24 MED ORDER — OXYCODONE-ACETAMINOPHEN 10-325 MG PO TABS: ORAL_TABLET | ORAL | 0 refills | Status: DC

## 2015-10-24 MED ORDER — AMPHETAMINE-DEXTROAMPHET ER 30 MG PO CP24: ORAL_CAPSULE | ORAL | 0 refills | Status: DC

## 2015-10-24 MED ORDER — NYSTATIN 100000 UNIT/GM EX POWD: Freq: Two times a day (BID) | CUTANEOUS | 0 refills | Status: DC

## 2015-10-24 NOTE — Progress Notes (Addendum)
Subjective:    Patient ID: Lauren Greene, female    DOB: 07/29/1965, 50 y.o.   MRN: [id number]  HPI Patient arrives for ADHD check.  Patient would like to discuss long tern excessive sweating-thinks it is from antidepressants-may need referral to new pain clinic for oxycontin because she is almost out of her oxycontin from previous pain clinic. Patient would also like to discuss IBS. Patient states her ADD is doing well. She states she takes her medicine on a regular basis it helps her focus at work She has severe low back pain and sciatica on the right side. She uses her pain medicine for this. It frustrates her that she has ongoing pain. She has tried anti-inflammatories. We have seen her numerous times over the past 2 years with low back pain. She is on pain medication to help manage this as well as her arthritis. She relates to us that she had OxyContin that was not prescribed by us that was prescribed by previous doctor that she had been taking at nighttime to get pain relief. She states the pain will wake her up at night if she does not take the medicine. And also inhibits her activity makes it difficult for her to work her job. Despite taking the pain medicine she still has severe discomfort does radiate into the right leg. Once again she is been seen multiple different times earlier this year with back pain has tried anti-inflammatory stretching and conservative therapy without getting better. She was taking OxyContin that she had left over at bedtime I warned her against this. If the patient feels that her current regimen is not handling this I recommend we refer her to pain management she will consider this  Patient having significant mental health illness with feeling sad at times anxious at times is on medications that just don't seem to be helping as much is she feels a should plus also patient states that she has a lot of sweats and she is requesting to be on an possibly a different  medicine. Review of Systems Lumbar pain knee pain pain sciatica swelling in the legs burning intermittent numbness in the legs nervousness hot flashes sweats no fevers    Objective:   Physical Exam Lungs clear heart regular pulse normal low back subjective tenderness positive straight leg raise worse on the right extremities no edema skin warm dry neurologic grossly normal  Patient has a difficult time with range of motion. She cannot extend backwards she cannot flex forward. She has significant straight leg positive pain on the right side. It is hard to ascertain the degree of muscle involvement because of the pain.     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 All of these were anti-cholinergics. I am concerned that ongoing use of anticholinergic and of itself has its own side effects and also can be associated with increased risk of dementia  Chronic pain patient has decided to stick with us increase medication to no greater than 5 per day for low back sciatica. Follow-up in 3-4 weeks.  Arthritis-anti-inflammatories-pain medication see above  ADD 3 scripts given  IBS symptoms consider other treatments looking into this  Referral to neurosurgery because of the chronic back pain. Patient does have sciatica symptoms into the right leg. She is failed conservative measures. She is been seen multiple times over the past 6 months. Low  back pain radiates into the right leg. Inhibits her ability to work. Pain medicine does not adequately control it.  Referral to psychiatry because of underlying chronic anxiousness plus also intermittent sadness and hyperhidrosis related to depression medicines

## 2015-10-26 ENCOUNTER — Telehealth: Payer: Self-pay

## 2015-10-26 NOTE — Telephone Encounter (Signed)
Left message on voicemail to return call.

## 2015-10-26 NOTE — Telephone Encounter (Signed)
-----   Message from Babs SciaraScott A Luking, MD sent at 10/25/2015  6:03 PM EDT ----- Please let the patient know that I did in fact look into the suggested medicines that she had. These are not considered mainstream treatments for a family physician to prescribed. I do not feel comfortable prescribing these also these medications are anti-cholinergics which have recently been linked to increased risk of Alzheimer's and dementia development. As for her IBS one option would be Levsin/SL or bentyl either one of these I would feel comfortable prescribing. I believe it is in the patient's best interest for us to move forward with psychiatry consultation and have her talk with psychiatrist to find a medicine that would be best for her with less triggering of excessive sweating. I am sympathetic to the patient but I do believe this is the best course of action

## 2015-10-26 NOTE — Telephone Encounter (Signed)
Notified patient that Dr. Lorin PicketScott did in fact look into the suggested medicines that she had. These are not considered mainstream treatments for a family physician to prescribed. He does not feel comfortable prescribing these also these medications are anti-cholinergics which have recently been linked to increased risk of Alzheimer's and dementia development. As for her IBS one option would be Levsin/SL or bentyl either one of these he would feel comfortable prescribing. He believes it is in the patient's best interest for us to move forward with psychiatry consultation and have her talk with psychiatrist to find a medicine that would be best for her with less triggering of excessive sweating. Patient verbalized understanding and agreed.

## 2015-10-30 ENCOUNTER — Telehealth: Payer: Self-pay | Admitting: Family Medicine

## 2015-10-30 DIAGNOSIS — M549 Dorsalgia, unspecified: Principal | ICD-10-CM

## 2015-10-30 DIAGNOSIS — G8929 Other chronic pain: Secondary | ICD-10-CM

## 2015-10-30 NOTE — Telephone Encounter (Signed)
Patient had a fall yesterday on a concrete pad and she hurt her back again.  She would like referral to neurosurgery as discussed with Dr. Lorin PicketScott.

## 2015-10-30 NOTE — Telephone Encounter (Signed)
Referral in system. Patient notified.  

## 2015-10-30 NOTE — Telephone Encounter (Signed)
Please refer patient to neurosurgery for further evaluation regarding chronic back pain and sciatica

## 2015-10-31 ENCOUNTER — Telehealth (HOSPITAL_COMMUNITY): Payer: Self-pay | Admitting: *Deleted

## 2015-11-07 ENCOUNTER — Telehealth: Payer: Self-pay | Admitting: Family Medicine

## 2015-11-07 DIAGNOSIS — M5431 Sciatica, right side: Secondary | ICD-10-CM

## 2015-11-07 DIAGNOSIS — G8929 Other chronic pain: Secondary | ICD-10-CM

## 2015-11-07 DIAGNOSIS — M549 Dorsalgia, unspecified: Principal | ICD-10-CM

## 2015-11-07 NOTE — Telephone Encounter (Signed)
Pt will need MRI to send with neurosurgery referral  They have to review results & OV notes before an appointment can be scheduled.   Last OV note states to follow up in 3-4 weeks and pt is scheduled for that on 11/22/15  Pt's last lumbar MRI was 2015    Please advise

## 2015-11-08 NOTE — Telephone Encounter (Signed)
I would recommend MRI with contrast because of previous back surgery

## 2015-11-08 NOTE — Telephone Encounter (Signed)
Left message return call MRI Lumbar Spine with contrast is scheduled for September 5th, 2017 @ [facility] @ 1100. Patient needs to be there at 1045 to register.

## 2015-11-08 NOTE — Telephone Encounter (Signed)
Left message return call 11/08/15 

## 2015-11-08 NOTE — Telephone Encounter (Signed)
Spoke with patient and informed her per Dr.Scott Luking- neurosurgery is requiring a up to date lumbar MRI before they will see you. Patient stated that she has had some previous cartilage surgery on her back where they took out a piece of cartilage. May we proceed with plain MRI or does MRI need to be with contrast. Please advise?

## 2015-11-08 NOTE — Telephone Encounter (Signed)
Please inform the patient that neurosurgery is requiring a up-to-date lumbar MRI before they will see her. Please find out from the patient has she had previous surgery on her lower back.(If no surgery plain MRI lumbar spine. If she has had previous lumbar surgery she will need MRI the lumbar spine with contrast.) Once the MRI is completed then they will review the results of the MRI and then inform the patient when they can see her. Also of side note patient has a follow-up office visit in early September I am not sure if this is needed-please inquire with the patient regarding this office visit.(I'm not sure if we were following up on a particular item or not)

## 2015-11-09 NOTE — Telephone Encounter (Signed)
Left message return call 11/09/15 

## 2015-11-10 ENCOUNTER — Other Ambulatory Visit: Payer: Self-pay | Admitting: Family Medicine

## 2015-11-10 ENCOUNTER — Encounter: Payer: Self-pay | Admitting: Family Medicine

## 2015-11-10 NOTE — Telephone Encounter (Signed)
Spoke with patient and informed her MRI Lumbar Spine with contrast is scheduled for September 5th, 2017 @ [facility] @ 1100. Patient needs to be there at 1045 to register. Patient verbalized understanding.

## 2015-11-16 ENCOUNTER — Ambulatory Visit (INDEPENDENT_AMBULATORY_CARE_PROVIDER_SITE_OTHER): Payer: BLUE CROSS/BLUE SHIELD | Admitting: Nurse Practitioner

## 2015-11-16 ENCOUNTER — Encounter: Payer: Self-pay | Admitting: Nurse Practitioner

## 2015-11-16 VITALS — BP 130/90 | Temp 98.2°F | Ht 66.25 in | Wt 282.4 lb

## 2015-11-16 DIAGNOSIS — K148 Other diseases of tongue: Secondary | ICD-10-CM

## 2015-11-16 MED ORDER — PENICILLIN V POTASSIUM 500 MG PO TABS: 500.0000 mg | ORAL_TABLET | Freq: Four times a day (QID) | ORAL | 0 refills | Status: DC

## 2015-11-16 MED ORDER — TRIAMCINOLONE ACETONIDE 0.1 % MT PSTE: PASTE | OROMUCOSAL | 0 refills | Status: DC

## 2015-11-16 NOTE — Progress Notes (Signed)
Subjective:  Presents for c/o of a "hole" on the left part of the tongue for about 3 weeks. Has gotten slightly smaller. No fever. Area is tender and radiates into jaw and chin area. Has tried hydrogen peroxide rinse and topical lidocaine. Started with a scab, then yellow drainage. Previous smoker for over 30 years; quit in 2006. No history of oral tobacco use.   Objective:   BP 130/90   Temp 98.2 F (36.8 C) (Oral)   Ht 5' 6.25" (1.683 m)   Wt 282 lb 6 oz (128.1 kg)   LMP 03/19/2003   BMI 45.23 kg/m  NAD. Alert, oriented. Small superficial round open area with white tissue surrounding it noted on left lateral tongue area. Small tender firm area under lesion. Pharynx clear. Moderate cervical lymphadenopathy bilat; greater on the left.  Assessment: Tongue lesion - Plan: Ambulatory referral to ENT  Plan:  Meds ordered this encounter  Medications  . penicillin v potassium (VEETID) 500 MG tablet    Sig: Take 1 tablet (500 mg total) by mouth 4 (four) times daily.    Dispense:  28 tablet    Refill:  0    Order Specific Question:   Supervising Provider    Answer:   Merlyn AlbertLUKING, WILLIAM S [[id number]]  . triamcinolone (KENALOG) 0.1 % paste    Sig: Apply to affected area on tongue BID prn    Dispense:  5 g    Refill:  0    Order Specific Question:   Supervising Provider    Answer:   Riccardo DubinLUKING, WILLIAM S [[id number]]   Refer to ENT specialist. Call back sooner if any problems.

## 2015-11-21 ENCOUNTER — Ambulatory Visit (HOSPITAL_COMMUNITY): Payer: BLUE CROSS/BLUE SHIELD

## 2015-11-22 ENCOUNTER — Ambulatory Visit: Payer: BLUE CROSS/BLUE SHIELD | Admitting: Family Medicine

## 2015-11-24 ENCOUNTER — Ambulatory Visit (HOSPITAL_COMMUNITY): Admission: RE | Admit: 2015-11-24 | Payer: BLUE CROSS/BLUE SHIELD | Source: Ambulatory Visit

## 2015-11-30 ENCOUNTER — Ambulatory Visit (HOSPITAL_COMMUNITY)
Admission: RE | Admit: 2015-11-30 | Discharge: 2015-11-30 | Disposition: A | Discharge status: Home / Self Care | Payer: BLUE CROSS/BLUE SHIELD | Source: Ambulatory Visit | Attending: Family Medicine | Admitting: Family Medicine

## 2015-11-30 ENCOUNTER — Ambulatory Visit (INDEPENDENT_AMBULATORY_CARE_PROVIDER_SITE_OTHER): Payer: BLUE CROSS/BLUE SHIELD | Admitting: Family Medicine

## 2015-11-30 ENCOUNTER — Encounter: Payer: Self-pay | Admitting: Family Medicine

## 2015-11-30 VITALS — BP 114/80 | Ht 66.25 in | Wt 282.0 lb

## 2015-11-30 DIAGNOSIS — Z79891 Long term (current) use of opiate analgesic: Secondary | ICD-10-CM

## 2015-11-30 DIAGNOSIS — M549 Dorsalgia, unspecified: Secondary | ICD-10-CM

## 2015-11-30 DIAGNOSIS — M25572 Pain in left ankle and joints of left foot: Secondary | ICD-10-CM | POA: Diagnosis not present

## 2015-11-30 DIAGNOSIS — M4806 Spinal stenosis, lumbar region: Secondary | ICD-10-CM | POA: Insufficient documentation

## 2015-11-30 DIAGNOSIS — M5127 Other intervertebral disc displacement, lumbosacral region: Secondary | ICD-10-CM | POA: Diagnosis not present

## 2015-11-30 DIAGNOSIS — F988 Other specified behavioral and emotional disorders with onset usually occurring in childhood and adolescence: Secondary | ICD-10-CM

## 2015-11-30 DIAGNOSIS — M25559 Pain in unspecified hip: Secondary | ICD-10-CM

## 2015-11-30 DIAGNOSIS — G8929 Other chronic pain: Secondary | ICD-10-CM | POA: Diagnosis not present

## 2015-11-30 DIAGNOSIS — M5431 Sciatica, right side: Secondary | ICD-10-CM | POA: Insufficient documentation

## 2015-11-30 DIAGNOSIS — M25569 Pain in unspecified knee: Secondary | ICD-10-CM

## 2015-11-30 DIAGNOSIS — F909 Attention-deficit hyperactivity disorder, unspecified type: Secondary | ICD-10-CM

## 2015-11-30 MED ORDER — AMPHETAMINE-DEXTROAMPHET ER 30 MG PO CP24: ORAL_CAPSULE | ORAL | 0 refills | Status: DC

## 2015-11-30 MED ORDER — GADOBENATE DIMEGLUMINE 529 MG/ML IV SOLN
20.0000 mL | Freq: Once | INTRAVENOUS | Status: AC | PRN
  Administered 2015-11-30: 20 mL via INTRAVENOUS

## 2015-11-30 MED ORDER — CEPHALEXIN 500 MG PO CAPS: 500.0000 mg | ORAL_CAPSULE | Freq: Four times a day (QID) | ORAL | 0 refills | Status: AC

## 2015-11-30 MED ORDER — OXYCODONE-ACETAMINOPHEN 10-325 MG PO TABS: ORAL_TABLET | ORAL | 0 refills | Status: DC

## 2015-11-30 MED ORDER — NYSTATIN 100000 UNIT/GM EX POWD: Freq: Two times a day (BID) | CUTANEOUS | 0 refills | Status: DC

## 2015-11-30 MED ORDER — LIDOCAINE VISCOUS 2 % MT SOLN: OROMUCOSAL | 0 refills | Status: DC

## 2015-11-30 MED ORDER — FIRST-DUKES MOUTHWASH MT SUSP: OROMUCOSAL | 0 refills | Status: DC

## 2015-11-30 MED ORDER — OMEPRAZOLE 40 MG PO CPDR: 40.0000 mg | DELAYED_RELEASE_CAPSULE | Freq: Every day | ORAL | 11 refills | Status: DC

## 2015-11-30 NOTE — Progress Notes (Signed)
   Subjective:    Patient ID: Lauren Greene, female    DOB: 01/19/1966, 50 y.o.   MRN: [id number]  HPI This patient was seen today for chronic pain  The medication list was reviewed and updated.   -Compliance with medication: takes oxycodone 10-325mg  4 -6 a day and oxycodone 10mg  at night.   - Number patient states they take daily: 2 qam every day and then as needed at least 4 per day  -when was the last dose patient took? This am   The patient was advised the importance of maintaining medication and not using illegal substances with these.  Refills needed: yes  The patient was educated that we can provide 3 monthly scripts for their medication, it is their responsibility to follow the instructions.  Side effects or complications from medications: none  Patient is aware that pain medications are meant to minimize the severity of the pain to allow their pain levels to improve to allow for better function. They are aware of that pain medications cannot totally remove their pain.  Due for UDT ( at least once per year) : due today  Requesting refill on nystatin powder. Wants a big jar this time.   Requesting magic mouthwash, triamcinolone paste, and viscous lidocaine for her tongue. Has appt with ENT on the 21st.   Wants rx for something stronger than prevacid for heartburn. Wants omeprazole 40mg .    Check left 2nd toe. Red and swollen. Off and on for while.    Review of Systems  Constitutional: Negative for activity change and appetite change.  Gastrointestinal: Negative for abdominal pain and vomiting.  Neurological: Negative for weakness.  Psychiatric/Behavioral: Negative for confusion.       Objective:   Physical Exam  Constitutional: She appears well-nourished. No distress.  HENT:  Head: Normocephalic.  Cardiovascular: Normal rate, regular rhythm and normal heart sounds.   No murmur heard. Pulmonary/Chest: Effort normal and breath sounds normal.  Musculoskeletal:  She exhibits no edema.  Lymphadenopathy:    She has no cervical adenopathy.  Neurological: She is alert.  Psychiatric: Her behavior is normal.  Vitals reviewed.  On physical exam she has a hammertoe as well as cellulitis she would benefit from antibiotics  Patient with severe chronic back pain as well as osteoarthritis uses pain medicine responsibly but states it does not do enough to help her pain I do not feel comfortable increasing the dose I believe the patient would benefit from being under the care of pain management  ADD-patient doing well with medication continue current medication. Follow-up 3 months  Drug registry was checked     Assessment & Plan:  Tongue lesion see specialist await the results possibly will need a biopsy  Foot pain cellulitis antibiotics prescribed see specialist encourage patient to use lotion encourage patient use o socks shoes  Chronic pain and discomfort in the hips and knees referral to pain management patient takes current medication it does not do enough to control her pain I do not feel comfortable as a primary care doctor going above this protocol I believe she would benefit from having a pain specialist work with her  Osteoarthritis stable  Severe reflux omeprazole 40 mg daily  Patient needs various medications written for her today these were given

## 2015-12-01 ENCOUNTER — Other Ambulatory Visit: Payer: Self-pay

## 2015-12-01 ENCOUNTER — Other Ambulatory Visit: Payer: Self-pay | Admitting: Nurse Practitioner

## 2015-12-01 DIAGNOSIS — M48061 Spinal stenosis, lumbar region without neurogenic claudication: Secondary | ICD-10-CM

## 2015-12-05 ENCOUNTER — Encounter: Payer: Self-pay | Admitting: Family Medicine

## 2015-12-06 ENCOUNTER — Encounter: Payer: Self-pay | Admitting: Family Medicine

## 2015-12-07 ENCOUNTER — Ambulatory Visit (INDEPENDENT_AMBULATORY_CARE_PROVIDER_SITE_OTHER): Payer: BLUE CROSS/BLUE SHIELD | Admitting: Otolaryngology

## 2015-12-07 DIAGNOSIS — K120 Recurrent oral aphthae: Secondary | ICD-10-CM | POA: Diagnosis not present

## 2015-12-08 LAB — TOXASSURE SELECT 13 (MW), URINE

## 2015-12-18 ENCOUNTER — Ambulatory Visit (INDEPENDENT_AMBULATORY_CARE_PROVIDER_SITE_OTHER): Payer: BLUE CROSS/BLUE SHIELD | Admitting: Orthopedic Surgery

## 2015-12-18 DIAGNOSIS — M6702 Short Achilles tendon (acquired), left ankle: Secondary | ICD-10-CM

## 2015-12-18 DIAGNOSIS — M2011 Hallux valgus (acquired), right foot: Secondary | ICD-10-CM | POA: Diagnosis not present

## 2016-01-02 ENCOUNTER — Ambulatory Visit (HOSPITAL_COMMUNITY): Payer: Self-pay | Admitting: Psychiatry

## 2016-01-02 NOTE — Progress Notes (Deleted)
Psychiatric Initial Adult Assessment   Patient Identification: Lauren Greene MRN:  [id number] Date of Evaluation:  01/02/2016 Referral Source: Dr. Lilyan PuntLuking Scott Chief Complaint:   Visit Diagnosis: No diagnosis found.  History of Present Illness:   Lauren Hakelaine G Schara is a 50 year old female with depression, ADD, chronic pain,  (06/2012, Adderall was started)  Associated Signs/Symptoms: Depression Symptoms:  {DEPRESSION SYMPTOMS:20000} (Hypo) Manic Symptoms:  {BHH MANIC SYMPTOMS:22872} Anxiety Symptoms:  {BHH ANXIETY SYMPTOMS:22873} Psychotic Symptoms:  {BHH PSYCHOTIC SYMPTOMS:22874} PTSD Symptoms: {BHH PTSD SYMPTOMS:22875}  Past Psychiatric History: ***  Previous Psychotropic Medications: {YES/NO:21197}  Substance Abuse History in the last 12 months:  {yes no:314532}  Consequences of Substance Abuse: {BHH CONSEQUENCES OF SUBSTANCE ABUSE:22880}  Past Medical History:  Past Medical History:  Diagnosis Date  . ADHD (attention deficit hyperactivity disorder)   . Arthritis   . Back pain   . Broken femur (HCC) 08/27/12  . Migraine   . Osteoarthritis   . Thyroid disease    hypothyroidism    Past Surgical History:  Procedure Laterality Date  . BACK SURGERY    . CERVICAL BIOPSY  W/ LOOP ELECTRODE EXCISION  1990's   [place]  . ENDOMETRIAL BIOPSY  04-05-05 and 02-12-06   04-05-05 benign but too rapid growth. RXd Provera and repeat Bx 68mo. 02-12-06 Bx benign--Dr. Leda QuailSuzanne Miller  . FEMUR IM NAIL Left 08/27/2012   Procedure: INTRAMEDULLARY (IM) RETROGRADE FEMORAL NAILING;  Surgeon: Kathryne Hitchhristopher Y Blackman, MD;  Location: [facility] OR;  Service: Orthopedics;  Laterality: Left;  . HEEL SPUR SURGERY    . IM NAILING FEMORAL SHAFT FRACTURE Left 08/27/2012   Dr Magnus IvanBLACKMAN  . TOTAL KNEE ARTHROPLASTY     x2  . TUBAL LIGATION      Family Psychiatric History: ***  Family History:  Family History  Problem Relation Age of Onset  . Cancer Mother   . Breast cancer Mother   . Heart  failure Father   . Cancer Father   . Cancer Other   . Heart failure Other     Social History:   Social History   Social History  . Marital status: Single    Spouse name: N/A  . Number of children: N/A  . Years of education: N/A   Social History Main Topics  . Smoking status: Former Smoker    Years: 10.00    Types: Cigarettes    Quit date: 03/18/2004  . Smokeless tobacco: Never Used  . Alcohol use 0.0 oz/week     Comment: rarely  . Drug use: No  . Sexual activity: Not Currently    Partners: Female    Birth control/ protection:    Other Topics Concern  . Not on file   Social History Narrative  . No narrative on file    Additional Social History: ***  Allergies:   Allergies  Allergen Reactions  . Wellbutrin [Bupropion]     Anger issues  . Lyrica [Pregabalin] Swelling    Leg swelling    Metabolic Disorder Labs: Lab Results  Component Value Date   HGBA1C 5.6 07/22/2014   MPG 103 02/16/2013   MPG 111 07/24/2010   No results found for: PROLACTIN Lab Results  Component Value Date   CHOL 138 07/06/2015   TRIG 101 07/06/2015   HDL 43 07/06/2015   CHOLHDL 3.2 07/06/2015   VLDL 13 07/10/2012   LDLCALC 75 07/06/2015   LDLCALC 83 07/22/2014     Current Medications: Current Outpatient Prescriptions  Medication Sig  Dispense Refill  . amphetamine-dextroamphetamine (ADDERALL XR) 30 MG 24 hr capsule 2 qam 60 capsule 0  . clobetasol ointment (TEMOVATE) 0.05 % Apply 1 application topically 2 (two) times daily. Apply as directed twice daily 60 g 1  . Diclofenac Sodium CR 100 MG 24 hr tablet TAKE 1 TABLET BY MOUTH TWICE DAILY. TAKE WITH FOOD. 60 tablet 0  . Diphenhyd-Hydrocort-Nystatin (FIRST-DUKES MOUTHWASH) SUSP Take one tsp swish and spit qid 1 Bottle 0  . lidocaine (XYLOCAINE) 2 % solution Use bid prn 120 mL 0  . methocarbamol (ROBAXIN) 750 MG tablet Take 750 mg by mouth 2 (two) times daily as needed for muscle spasms.    Marland Kitchen nystatin (MYCOSTATIN/NYSTOP) powder  Apply topically 2 (two) times daily. 45 g 0  . omeprazole (PRILOSEC) 40 MG capsule Take 1 capsule (40 mg total) by mouth daily. 30 capsule 11  . Oxycodone HCl 10 MG TABS Take by mouth at bedtime.    Marland Kitchen oxyCODONE-acetaminophen (PERCOCET) 10-325 MG tablet One q 4 hours prn Max 5 per day 150 tablet 0  . triamcinolone (KENALOG) 0.1 % paste APPLY TO THE AFFECTED AREA ON THE TONGUE TWICE DAILY AS NEEDED. 5 g 0   No current facility-administered medications for this visit.     Neurologic: Headache: {BHH YES OR NO:22294} Seizure: {BHH YES OR NO:22294} Paresthesias:{BHH YES OR ZO:10960}  Musculoskeletal: Strength & Muscle Tone: {desc; muscle tone:32375} Gait & Station: {PE GAIT ED AVWU:98119} Patient leans: {Patient Leans:21022755}  Psychiatric Specialty Exam: ROS  Last menstrual period 03/19/2003.There is no height or weight on file to calculate BMI.  General Appearance: {Appearance:22683}  Eye Contact:  {BHH EYE CONTACT:22684}  Speech:  {Speech:22685}  Volume:  {Volume (PAA):22686}  Mood:  {BHH MOOD:22306}  Affect:  {Affect (PAA):22687}  Thought Process:  {Thought Process (PAA):22688}  Orientation:  {BHH ORIENTATION (PAA):22689}  Thought Content:  {Thought Content:22690}  Suicidal Thoughts:  {ST/HT (PAA):22692}  Homicidal Thoughts:  {ST/HT (PAA):22692}  Memory:  {BHH MEMORY:22881}  Judgement:  {Judgement (PAA):22694}  Insight:  {Insight (PAA):22695}  Psychomotor Activity:  {Psychomotor (PAA):22696}  Concentration:  {Concentration:21399}  Recall:  {BHH GOOD/FAIR/POOR:22877}  Fund of Knowledge:{BHH GOOD/FAIR/POOR:22877}  Language: {BHH GOOD/FAIR/POOR:22877}  Akathisia:  {BHH YES OR NO:22294}  Handed:  {Handed:22697}  AIMS (if indicated):  ***  Assets:  {Assets (PAA):22698}  ADL's:  {BHH JYN'W:29562}  Cognition: {chl bhh cognition:304700322}  Sleep:  ***    Treatment Plan Summary: {CHL AMB BH MD TX ZHYQ:6578469629}   Neysa Hotter, MD 10/17/20179:03 AM

## 2016-01-12 ENCOUNTER — Telehealth: Payer: Self-pay | Admitting: Family Medicine

## 2016-01-12 ENCOUNTER — Encounter: Payer: Self-pay | Admitting: Family Medicine

## 2016-01-12 ENCOUNTER — Ambulatory Visit (INDEPENDENT_AMBULATORY_CARE_PROVIDER_SITE_OTHER): Payer: BLUE CROSS/BLUE SHIELD | Admitting: Family Medicine

## 2016-01-12 VITALS — BP 138/88 | Temp 98.1°F | Ht 66.5 in | Wt 270.4 lb

## 2016-01-12 DIAGNOSIS — A084 Viral intestinal infection, unspecified: Secondary | ICD-10-CM

## 2016-01-12 DIAGNOSIS — R112 Nausea with vomiting, unspecified: Secondary | ICD-10-CM

## 2016-01-12 MED ORDER — ONDANSETRON 8 MG PO TBDP: 8.0000 mg | ORAL_TABLET | Freq: Three times a day (TID) | ORAL | 2 refills | Status: DC | PRN

## 2016-01-12 NOTE — Telephone Encounter (Signed)
Left message to return call 

## 2016-01-12 NOTE — Telephone Encounter (Signed)
Typically difficult stones are the source of the problem a person usually has right upper quadrant fullness and pain in typically does not get the diarrhea aspect I would recommend that if symptoms are not doing better by early next week we will need to do a ultrasound with attention to the right upper quadrant. Patient just had labs drawn we will be watching for the results. Use Zofran for now.

## 2016-01-12 NOTE — Telephone Encounter (Signed)
Wanting to let you know that the Psych appointment you made, she had a conflicting appointment.  She has attempted calling them back twice to reschedule, but they will not call her back.  She is requesting you set her up for another afternoon appointment.

## 2016-01-12 NOTE — Telephone Encounter (Signed)
Patient said she had a recent MRI that showed she had gallstones.  She wants to know if this could cause her to be sick?

## 2016-01-12 NOTE — Progress Notes (Signed)
   Subjective:    Patient ID: Lauren Greene, female    DOB: 05/31/1965, 50 y.o.   MRN: [id number]  Emesis   This is a new problem. The current episode started in the past 7 days. Associated symptoms include diarrhea and headaches. Associated symptoms comments: Nausea, fatigue       .   Patient states she stopped taking pain medication on today and has not had any vomiting.  Patient states this started off his diarrhea then progressed and having some vomiting and then had intermittent vomiting and mild diarrhea no mucoid drainage denies cough or wheeze  Review of Systems  Gastrointestinal: Positive for diarrhea and vomiting.  Neurological: Positive for headaches.   Patient denies fevers denies sweats or chills. Energy level fair    Objective:   Physical Exam Lungs clear hearts regular abdomen is soft no guarding rebound or tenderness extremities no edema  May need ultrasound if ongoing troubles I do not feel the patient needs a CAT scan     Assessment & Plan:  Viral gastroenteritis I doubt that this is due to her medication she is going to hold off on her pain medicine for at least another day or 2 lab work ordered. Await the results if the lab work shows abnormality work it up further if symptoms do not go away over the course of the next 7-14 days consider referral to gastroenterology Zofran when necessary

## 2016-01-13 LAB — CBC WITH DIFFERENTIAL/PLATELET
Basophils Absolute: 0 10*3/uL (ref 0.0–0.2)
Basos: 1 %
EOS (ABSOLUTE): 0.1 10*3/uL (ref 0.0–0.4)
Eos: 1 %
Hematocrit: 43.4 % (ref 34.0–46.6)
Hemoglobin: 14.6 g/dL (ref 11.1–15.9)
Immature Grans (Abs): 0 10*3/uL (ref 0.0–0.1)
Immature Granulocytes: 0 %
Lymphocytes Absolute: 1.7 10*3/uL (ref 0.7–3.1)
Lymphs: 28 %
MCH: 27.8 pg (ref 26.6–33.0)
MCHC: 33.6 g/dL (ref 31.5–35.7)
MCV: 83 fL (ref 79–97)
Monocytes Absolute: 0.4 10*3/uL (ref 0.1–0.9)
Monocytes: 6 %
Neutrophils Absolute: 3.9 10*3/uL (ref 1.4–7.0)
Neutrophils: 64 %
Platelets: 274 10*3/uL (ref 150–379)
RBC: 5.25 x10E6/uL (ref 3.77–5.28)
RDW: 13.6 % (ref 12.3–15.4)
WBC: 6.1 10*3/uL (ref 3.4–10.8)

## 2016-01-13 LAB — LIPID PANEL
Chol/HDL Ratio: 3.9 ratio units (ref 0.0–4.4)
Cholesterol, Total: 181 mg/dL (ref 100–199)
HDL: 47 mg/dL (ref >39)
LDL Calculated: 108 mg/dL — ABNORMAL HIGH (ref 0–99)
Triglycerides: 132 mg/dL (ref 0–149)
VLDL Cholesterol Cal: 26 mg/dL (ref 5–40)

## 2016-01-13 LAB — BASIC METABOLIC PANEL
BUN/Creatinine Ratio: 24 — ABNORMAL HIGH (ref 9–23)
BUN: 14 mg/dL (ref 6–24)
CO2: 25 mmol/L (ref 18–29)
Calcium: 9.7 mg/dL (ref 8.7–10.2)
Chloride: 102 mmol/L (ref 96–106)
Creatinine, Ser: 0.58 mg/dL (ref 0.57–1.00)
GFR calc Af Amer: 123 mL/min/{1.73_m2} (ref >59)
GFR calc non Af Amer: 107 mL/min/{1.73_m2} (ref >59)
Glucose: 99 mg/dL (ref 65–99)
Potassium: 4.5 mmol/L (ref 3.5–5.2)
Sodium: 143 mmol/L (ref 134–144)

## 2016-01-13 LAB — LIPASE: Lipase: 25 U/L (ref 14–72)

## 2016-01-15 ENCOUNTER — Other Ambulatory Visit: Payer: Self-pay | Admitting: Family Medicine

## 2016-01-15 NOTE — Telephone Encounter (Signed)
Left message to return call 

## 2016-01-15 NOTE — Telephone Encounter (Signed)
Discussed with pt. Pt verbalized understanding.  °

## 2016-01-19 NOTE — Telephone Encounter (Signed)
Spoke with pt, explained that their office will only schedule with the patient Gave her their ph# again Pt states she will try to call them again

## 2016-01-29 ENCOUNTER — Telehealth (HOSPITAL_COMMUNITY): Payer: Self-pay | Admitting: *Deleted

## 2016-01-29 NOTE — Telephone Encounter (Signed)
RETURNED PHONE CALL, LEFT VOICE MESSAGE.  

## 2016-01-30 NOTE — Progress Notes (Signed)
Psychiatric Initial Adult Assessment   Patient Identification: Lauren Greene MRM:  [id number] Date of Evaluation:  02/01/2016 Referral Source: Dr. Lilyan Punt Chief Complaint:   Chief Complaint    Depression; Anxiety; New Evaluation    "I used to be self sufficient" Visit Diagnosis:    ICD-9-CM ICD-10-CM   1. Moderate episode of recurrent major depressive disorder (HCC) 296.32 F33.1     History of Present Illness:   Lauren Greene is a 50 year old female with ADHD per chart, anxiety, hyperhydrosis, chronic pain who presents for depression.  Patient states that she is here as she was advised to try other medication for depression.  She used to be on Effexor 325 mg with limited benefit.  She has back pain worsened after she had femur fracture in June 2014. She has been unable to function as she wishes. She has filed for disability, although she used to be self sufficient and independent. She endorses anhedonia and has not been able to do yard work as she used to. She stays in the house most of the time, although she used to be "more active and upbeat."  She endorses insomnia with night time awakening with pain. She has crying spells and tends to cry over anything. She feels that her attention has improved after she was started on Adderall. She denies SI, Hi, AH/VH. She has mild anxiety but denies panic attack. She denies alcohol use or drug use.   Associated Signs/Symptoms: Depression Symptoms:  depressed mood, anhedonia, insomnia, fatigue, anxiety, (Hypo) Manic Symptoms:  denies Anxiety Symptoms:  mild anxiety Psychotic Symptoms:  denies PTSD Symptoms: Negative  Past Psychiatric History:  Outpatient: She was evaluated for ADD in [place] in 2014; started on Adderall since then. Psychiatry admission: denies  Previous suicide attempt: denies Past trials of medication: Celexa, Wellbutrin ("mean"), Prozac, Effexor, Mirtazapine, duloxetine, Adderall (4 years), Lyrica  (swelling)  Previous Psychotropic Medications: Yes   Substance Abuse History in the last 12 months:  No.  Consequences of Substance Abuse: Negative  Past Medical History:  Past Medical History:  Diagnosis Date  . ADHD (attention deficit hyperactivity disorder)   . Arthritis   . Back pain   . Broken femur (HCC) 08/27/12  . Migraine   . Osteoarthritis   . Thyroid disease    hypothyroidism    Past Surgical History:  Procedure Laterality Date  . BACK SURGERY    . CERVICAL BIOPSY  W/ LOOP ELECTRODE EXCISION  1990's   in [place], [place]  . ENDOMETRIAL BIOPSY  04-05-05 and 02-12-06   04-05-05 benign but too rapid growth. RXd Provera and repeat Bx 61mo. 02-12-06 Bx benign--Dr. Leda Quail  . FEMUR IM NAIL Left 08/27/2012   Procedure: INTRAMEDULLARY (IM) RETROGRADE FEMORAL NAILING;  Surgeon: Kathryne Hitch, MD;  Location: [facility] OR;  Service: Orthopedics;  Laterality: Left;  . HEEL SPUR SURGERY    . IM NAILING FEMORAL SHAFT FRACTURE Left 08/27/2012   Dr Magnus Ivan  . TOTAL KNEE ARTHROPLASTY     x2  . TUBAL LIGATION      Family Psychiatric History:  Mother- bipolar, sister - bipolar,   Family History:  Family History  Problem Relation Age of Onset  . Cancer Mother   . Breast cancer Mother   . Heart failure Father   . Cancer Father   . Cancer Other   . Heart failure Other     Social History:   Social History   Social History  . Marital status:  Single    Spouse name: N/A  . Number of children: N/A  . Years of education: N/A   Social History Main Topics  . Smoking status: Former Smoker    Years: 10.00    Types: Cigarettes    Quit date: 03/18/2004  . Smokeless tobacco: Never Used  . Alcohol use 0.0 oz/week     Comment: rarely  . Drug use: No  . Sexual activity: Not Currently    Partners: Female    Birth control/ protection:    Other Topics Concern  . None   Social History Narrative  . None    Additional Social History:  Education: graduated from [employer], Struggled during childhood  Work: Pharmacologistpharmacy technician as needed,  Lives by herself, she has three children and one grandchild   Allergies:   Allergies  Allergen Reactions  . Wellbutrin [Bupropion]     Anger issues  . Lyrica [Pregabalin] Swelling    Leg swelling    Metabolic Disorder Labs: Lab Results  Component Value Date   HGBA1C 5.6 07/22/2014   MPG 103 02/16/2013   MPG 111 07/24/2010   No results found for: PROLACTIN Lab Results  Component Value Date   CHOL 181 01/12/2016   TRIG 132 01/12/2016   HDL 47 01/12/2016   CHOLHDL 3.9 01/12/2016   VLDL 13 07/10/2012   LDLCALC 108 (H) 01/12/2016   LDLCALC 75 07/06/2015     Current Medications: Current Outpatient Prescriptions  Medication Sig Dispense Refill  . Diclofenac Sodium CR 100 MG 24 hr tablet TAKE 1 TABLET BY MOUTH TWICE DAILY. TAKE WITH FOOD. 60 tablet 0  . oxyCODONE-acetaminophen (PERCOCET) 10-325 MG tablet One q 4 hours prn Max 5 per day 150 tablet 0  . amphetamine-dextroamphetamine (ADDERALL XR) 30 MG 24 hr capsule 2 qam (Patient taking differently: Take 60 mg by mouth daily. 2 qam) 60 capsule 0  . clobetasol ointment (TEMOVATE) 0.05 % Apply 1 application topically 2 (two) times daily. Apply as directed twice daily (Patient not taking: Reported on 02/01/2016) 60 g 1  . Diphenhyd-Hydrocort-Nystatin (FIRST-DUKES MOUTHWASH) SUSP Take one tsp swish and spit qid (Patient not taking: Reported on 02/01/2016) 1 Bottle 0  . DULoxetine (CYMBALTA) 30 MG capsule Take 30 mg daily for two weeks, then 60 mg daily 60 capsule 0  . lidocaine (XYLOCAINE) 2 % solution Use bid prn (Patient not taking: Reported on 01/12/2016) 120 mL 0  . methocarbamol (ROBAXIN) 750 MG tablet Take 750 mg by mouth 2 (two) times daily as needed for muscle spasms.    Marland Kitchen. nystatin (MYCOSTATIN/NYSTOP) powder Apply topically 2 (two) times daily. (Patient not taking: Reported on 01/12/2016) 45 g 0  . omeprazole (PRILOSEC) 40 MG capsule Take 1 capsule (40  mg total) by mouth daily. (Patient not taking: Reported on 01/12/2016) 30 capsule 11  . ondansetron (ZOFRAN ODT) 8 MG disintegrating tablet Take 1 tablet (8 mg total) by mouth every 8 (eight) hours as needed for nausea or vomiting. 20 tablet 2  . Oxycodone HCl 10 MG TABS Take by mouth at bedtime.    . traZODone (DESYREL) 50 MG tablet 25-50 mg at night as needed for sleep 30 tablet 0  . triamcinolone (KENALOG) 0.1 % paste APPLY TO THE AFFECTED AREA ON THE TONGUE TWICE DAILY AS NEEDED. 5 g 0   No current facility-administered medications for this visit.     Neurologic: Headache: No Seizure: No Paresthesias:No  Musculoskeletal: Strength & Muscle Tone: within normal limits Gait & Station: normal  Patient leans: N/A  Psychiatric Specialty Exam: Review of Systems  Musculoskeletal: Positive for back pain.  Psychiatric/Behavioral: Positive for depression. Negative for hallucinations, substance abuse and suicidal ideas. The patient is nervous/anxious and has insomnia.   All other systems reviewed and are negative.   Blood pressure (!) 158/95, pulse 72, height 5' 5.75" (1.67 m), weight 274 lb 3.2 oz (124.4 kg), last menstrual period 03/19/2003.Body mass index is 44.6 kg/m.  General Appearance: Fairly Groomed  Eye Contact:  Good  Speech:  Clear and Coherent  Volume:  Normal  Mood:  Depressed  Affect:  Tearful  Thought Process:  Coherent and Goal Directed  Orientation:  Full (Time, Place, and Person)  Thought Content:  Logical Perceptions: denies AH/VH  Suicidal Thoughts:  No  Homicidal Thoughts:  No  Memory:  Immediate;   Good Recent;   Good Remote;   Good  Judgement:  Good  Insight:  Fair  Psychomotor Activity:  Normal  Concentration:  Concentration: Good and Attention Span: Good  Recall:  Good  Fund of Knowledge:Good  Language: Good  Akathisia:  NA  Handed:  Right  AIMS (if indicated):  N/A  Assets:  Communication Skills Desire for Improvement  ADL's:  Intact  Cognition:  WNL  Sleep:  poor   Assessment Lauren Greene is a 50 year old female with ADHD per chart, anxiety, hyperhydrosis, chronic pain who presents for depression.  # MDD Patient endorses neurovegetative symptoms in the setting of worsening chronic pain. Will start duloxetine to target both her mood and pain. Noted that she is demoralized by her current physical condition and will greatly benefit from supportive therapy/CBT; she declines this option at this time. She is given resources for mindfulness and is encouraged to practice routinely.   # ADHD She was diagnosed ADHD [place] [facility] [place]. Currently prescribed on Adderall by her PCP. She denies significant side effect which includes anxiety. Noted that her BP was high on today's evaluation; will continue to monitor.   Plan 1. Start duloxetine 30 mg daily for two weeks, then 60 mg daily 2. Start Trazodone 25-50 mg at night as needed for sleep 3. Continue Adderall XR 20 mg daily 4. Return to clinic in one month   The patient demonstrates the following risk factors for suicide: Chronic risk factors for suicide include: psychiatric disorder of depression and chronic pain. Acute risk factors for suicide include: loss (financial, interpersonal, professional). Protective factors for this patient include: responsibility to others (children, family), coping skills and hope for the future. Considering these factors, the overall suicide risk at this point appears to be low. Patient is appropriate for outpatient follow up.   Treatment Plan Summary: Plan as above   [name] Izabelle Daus, MD 11/16/20179:48 AM

## 2016-02-01 ENCOUNTER — Ambulatory Visit (INDEPENDENT_AMBULATORY_CARE_PROVIDER_SITE_OTHER): Payer: BLUE CROSS/BLUE SHIELD | Admitting: Psychiatry

## 2016-02-01 ENCOUNTER — Encounter (HOSPITAL_COMMUNITY): Payer: Self-pay | Admitting: Psychiatry

## 2016-02-01 VITALS — BP 158/95 | HR 72 | Ht 65.75 in | Wt 274.2 lb

## 2016-02-01 DIAGNOSIS — Z8249 Family history of ischemic heart disease and other diseases of the circulatory system: Secondary | ICD-10-CM | POA: Diagnosis not present

## 2016-02-01 DIAGNOSIS — F331 Major depressive disorder, recurrent, moderate: Secondary | ICD-10-CM

## 2016-02-01 DIAGNOSIS — Z87891 Personal history of nicotine dependence: Secondary | ICD-10-CM | POA: Diagnosis not present

## 2016-02-01 DIAGNOSIS — Z79891 Long term (current) use of opiate analgesic: Secondary | ICD-10-CM

## 2016-02-01 DIAGNOSIS — Z79899 Other long term (current) drug therapy: Secondary | ICD-10-CM

## 2016-02-01 DIAGNOSIS — Z803 Family history of malignant neoplasm of breast: Secondary | ICD-10-CM

## 2016-02-01 MED ORDER — DULOXETINE HCL 30 MG PO CPEP: ORAL_CAPSULE | ORAL | 0 refills | Status: DC

## 2016-02-01 MED ORDER — TRAZODONE HCL 50 MG PO TABS: ORAL_TABLET | ORAL | 0 refills | Status: DC

## 2016-02-01 NOTE — Patient Instructions (Addendum)
1. Start duloxetine 30 mg daily for two weeks, then 60 mg daily 2. Start Trazodone 25-50 mg at night as needed for sleep 3. Continue Adderall XR 20 mg daily 4. Return to clinic in one month

## 2016-02-11 ENCOUNTER — Other Ambulatory Visit: Payer: Self-pay | Admitting: Family Medicine

## 2016-02-26 ENCOUNTER — Telehealth (HOSPITAL_COMMUNITY): Payer: Self-pay | Admitting: *Deleted

## 2016-02-26 ENCOUNTER — Ambulatory Visit (INDEPENDENT_AMBULATORY_CARE_PROVIDER_SITE_OTHER): Payer: BLUE CROSS/BLUE SHIELD | Admitting: Family Medicine

## 2016-02-26 VITALS — BP 124/76 | Ht 66.5 in | Wt 273.6 lb

## 2016-02-26 DIAGNOSIS — G8929 Other chronic pain: Secondary | ICD-10-CM | POA: Diagnosis not present

## 2016-02-26 DIAGNOSIS — M5440 Lumbago with sciatica, unspecified side: Secondary | ICD-10-CM

## 2016-02-26 DIAGNOSIS — F988 Other specified behavioral and emotional disorders with onset usually occurring in childhood and adolescence: Secondary | ICD-10-CM

## 2016-02-26 MED ORDER — OXYCODONE-ACETAMINOPHEN 10-325 MG PO TABS: ORAL_TABLET | ORAL | 0 refills | Status: DC

## 2016-02-26 MED ORDER — AMPHETAMINE-DEXTROAMPHET ER 30 MG PO CP24: ORAL_CAPSULE | ORAL | 0 refills | Status: DC

## 2016-02-26 NOTE — Telephone Encounter (Signed)
returned phone call to patient regarding voice message to cancel appointment.

## 2016-02-26 NOTE — Progress Notes (Signed)
   Subjective:    Patient ID: Lauren Greene, female    DOB: 09/02/1965, 50 y.o.   MRN: [id number]  HPI  Patient states she is in the office today for ADHD medication and Pain management.  She states she did not like the last pain clinic she went to.  She states she needs refill on Percocet for the next 1-2 months until she receives back injection. This patient also is going to be seen a pain management doctor with her neurosurgeon she is requesting medication until she can see them area patient has been on medication long-term and not having any problems no addiction issues Review of Systems  Constitutional: Negative for activity change, appetite change and fatigue.  Gastrointestinal: Negative for abdominal pain.  Neurological: Negative for headaches.  Psychiatric/Behavioral: Negative for behavioral problems.       Objective:   Physical Exam  Constitutional: She appears well-nourished. No distress.  HENT:  Head: Normocephalic.  Cardiovascular: Normal rate, regular rhythm and normal heart sounds.   No murmur heard. Pulmonary/Chest: Effort normal and breath sounds normal.  Musculoskeletal: She exhibits no edema.  Lymphadenopathy:    She has no cervical adenopathy.  Neurological: She is alert.  Psychiatric: Her behavior is normal.  Vitals reviewed.         Assessment & Plan:  ADD-continue medication 3 scripts given does well with this  Chronic pain prescriptions given follow-up if ongoing troubles she was given 2 prescriptions today. She is trying to get him with the pain management group with her neurosurgeon she will let us know if they take this over

## 2016-02-26 NOTE — Progress Notes (Deleted)
[facility] MD/PA/NP OP Progress Note  02/26/2016 8:04 AM Lauren Greene  MRN:  [id number]  Chief Complaint:  Subjective:  *** HPI: *** Visit Diagnosis: No diagnosis found.  Past Psychiatric History:  Outpatient: She was evaluated for ADD in [place] in 2014; started on Adderall since then. Psychiatry admission: denies  Previous suicide attempt: denies Past trials of medication: Celexa, Wellbutrin ("mean"), Prozac, Effexor, Mirtazapine, duloxetine, Adderall (4 years), Lyrica (swelling)  Past Medical History:  Past Medical History:  Diagnosis Date  . ADHD (attention deficit hyperactivity disorder)   . Arthritis   . Back pain   . Broken femur (HCC) 08/27/12  . Migraine   . Osteoarthritis   . Thyroid disease    hypothyroidism    Past Surgical History:  Procedure Laterality Date  . BACK SURGERY    . CERVICAL BIOPSY  W/ LOOP ELECTRODE EXCISION  1990's   in [place]  . ENDOMETRIAL BIOPSY  04-05-05 and 02-12-06   04-05-05 benign but too rapid growth. RXd Provera and repeat Bx 47mo. 02-12-06 Bx benign--Dr. Leda QuailSuzanne Miller  . FEMUR IM NAIL Left 08/27/2012   Procedure: INTRAMEDULLARY (IM) RETROGRADE FEMORAL NAILING;  Surgeon: Kathryne Hitchhristopher Y Blackman, MD;  Location: [facility] OR;  Service: Orthopedics;  Laterality: Left;  . HEEL SPUR SURGERY    . IM NAILING FEMORAL SHAFT FRACTURE Left 08/27/2012   Dr Magnus IvanBLACKMAN  . TOTAL KNEE ARTHROPLASTY     x2  . TUBAL LIGATION      Family Psychiatric History:  Mother- bipolar, sister - bipolar,   Family History:  Family History  Problem Relation Age of Onset  . Cancer Mother   . Breast cancer Mother   . Heart failure Father   . Cancer Father   . Cancer Other   . Heart failure Other     Social History:  Social History   Social History  . Marital status: Single    Spouse name: N/A  . Number of children: N/A  . Years of education: N/A   Social History Main Topics  . Smoking status: Former Smoker    Years: 10.00    Types: Cigarettes     Quit date: 03/18/2004  . Smokeless tobacco: Never Used  . Alcohol use 0.0 oz/week     Comment: rarely  . Drug use: No  . Sexual activity: Not Currently    Partners: Female    Birth control/ protection:    Other Topics Concern  . Not on file   Social History Narrative  . 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 Wellbutrin [Bupropion]     Anger issues  . Lyrica [Pregabalin] Swelling    Leg swelling    Metabolic Disorder Labs: Lab Results  Component Value Date   HGBA1C 5.6 07/22/2014   MPG 103 02/16/2013   MPG 111 07/24/2010   No results found for: PROLACTIN Lab Results  Component Value Date   CHOL 181 01/12/2016   TRIG 132 01/12/2016   HDL 47 01/12/2016   CHOLHDL 3.9 01/12/2016   VLDL 13 07/10/2012   LDLCALC 108 (H) 01/12/2016   LDLCALC 75 07/06/2015     Current Medications: Current Outpatient Prescriptions  Medication Sig Dispense Refill  . amphetamine-dextroamphetamine (ADDERALL XR) 30 MG 24 hr capsule 2 qam (Patient taking differently: Take  60 mg by mouth daily. 2 qam) 60 capsule 0  . clobetasol ointment (TEMOVATE) 0.05 % Apply 1 application topically 2 (two) times daily. Apply as directed twice daily (Patient not taking: Reported on 02/01/2016) 60 g 1  . Diclofenac Sodium CR 100 MG 24 hr tablet TAKE 1 TABLET BY MOUTH TWICE DAILY. TAKE WITH FOOD. 60 tablet 0  . Diphenhyd-Hydrocort-Nystatin (FIRST-DUKES MOUTHWASH) SUSP Take one tsp swish and spit qid (Patient not taking: Reported on 02/01/2016) 1 Bottle 0  . DULoxetine (CYMBALTA) 30 MG capsule Take 30 mg daily for two weeks, then 60 mg daily 60 capsule 0  . lidocaine (XYLOCAINE) 2 % solution Use bid prn (Patient not taking: Reported on 01/12/2016) 120 mL 0  . methocarbamol (ROBAXIN) 750 MG  tablet Take 750 mg by mouth 2 (two) times daily as needed for muscle spasms.    Marland Kitchen. nystatin (MYCOSTATIN/NYSTOP) powder Apply topically 2 (two) times daily. (Patient not taking: Reported on 01/12/2016) 45 g 0  . omeprazole (PRILOSEC) 40 MG capsule Take 1 capsule (40 mg total) by mouth daily. (Patient not taking: Reported on 01/12/2016) 30 capsule 11  . ondansetron (ZOFRAN ODT) 8 MG disintegrating tablet Take 1 tablet (8 mg total) by mouth every 8 (eight) hours as needed for nausea or vomiting. 20 tablet 2  . Oxycodone HCl 10 MG TABS Take by mouth at bedtime.    Marland Kitchen. oxyCODONE-acetaminophen (PERCOCET) 10-325 MG tablet One q 4 hours prn Max 5 per day 150 tablet 0  . traZODone (DESYREL) 50 MG tablet 25-50 mg at night as needed for sleep 30 tablet 0  . triamcinolone (KENALOG) 0.1 % paste APPLY TO THE AFFECTED AREA ON THE TONGUE TWICE DAILY AS NEEDED. 5 g 0   No current facility-administered medications for this visit.     Neurologic: Headache: No Seizure: No Paresthesias: No  Musculoskeletal: Strength & Muscle Tone: within normal limits Gait & Station: normal Patient leans: N/A  Psychiatric Specialty Exam: ROS  Last menstrual period 03/19/2003.There is no height or weight on file to calculate BMI.  General Appearance: Fairly Groomed  Eye Contact:  Good  Speech:  Clear and Coherent  Volume:  Normal  Mood:  {BHH MOOD:22306}  Affect:  {Affect (PAA):22687}  Thought Process:  Coherent and Goal Directed  Orientation:  Full (Time, Place, and Person)  Thought Content: Logical   Suicidal Thoughts:  {ST/HT (PAA):22692}  Homicidal Thoughts:  {ST/HT (PAA):22692}  Memory:  Immediate;   Good Recent;   Good Remote;   Good  Judgement:  Good  Insight:  Fair  Psychomotor Activity:  Normal  Concentration:  Concentration: Good and Attention Span: Good  Recall:  Good  Fund of Knowledge: Good  Language: Good  Akathisia:  No  Handed:  Right  AIMS (if indicated):  N/A  Assets:  Communication  Skills Desire for Improvement  ADL's:  Intact  Cognition: WNL  Sleep:  ***   Assessment Lauren Greene is a 50 year old female with ADHD per chart, anxiety, hyperhydrosis, chronic pain who presents for depression.  # MDD Patient endorses neurovegetative symptoms in the setting of worsening chronic pain. Will start duloxetine to target both her mood and pain. Noted that she is demoralized by her current physical condition and will greatly benefit from supportive therapy/CBT; she declines this option at this time. She is given resources for mindfulness and is encouraged to practice routinely.   # ADHD She was diagnosed ADHD [place] [facility]. Currently prescribed on Adderall by  her PCP. She denies significant side effect which includes anxiety. Noted that her BP was high on today's evaluation; will continue to monitor.   Plan 1. Start duloxetine 30 mg daily for two weeks, then 60 mg daily 2. Start Trazodone 25-50 mg at night as needed for sleep 3. Continue Adderall XR 20 mg daily 4. Return to clinic in one month            The patient demonstrates the following risk factors for suicide: Chronic risk factors for suicide include: psychiatric disorder of depression and chronic pain. Acute risk factors for suicide include: loss (financial, interpersonal, professional). Protective factors for this patient include: responsibility to others (children, family), coping skills and hope for the future. Considering these factors, the overall suicide risk at this point appears to be low. Patient is appropriate for outpatient follow up.   Treatment Plan Summary: Plan as above  Treatment Plan Summary:{[facility] MD [place] VHQI:[id number]}   Neysa Hotter, MD 02/26/2016, 8:04 AM

## 2016-02-27 ENCOUNTER — Ambulatory Visit (HOSPITAL_COMMUNITY): Payer: Self-pay | Admitting: Psychiatry

## 2016-02-28 ENCOUNTER — Ambulatory Visit: Payer: BLUE CROSS/BLUE SHIELD | Admitting: Family Medicine

## 2016-02-29 NOTE — Progress Notes (Signed)
BH MD/PA/NP OP Progress Note  03/05/2016 1:21 PM Lauren Greene  MRN:  [id number]  Chief Complaint:  Chief Complaint    Follow-up; Depression     Subjective:  "I can't be the person who I was" HPI:  Patient states that there was some improvement in her mood. She feels more motivated. She feels stressed at work with 17 days shift and talks about her boss. She feels depressed about her pain, stating that "I can't be the person who I was." She hopes that she will have less pain after upcoming back surgery. She endorses fatigue although she has not missed her work. She feels anxious but denies panic attack. She denies SI.   Visit Diagnosis:    ICD-9-CM ICD-10-CM   1. Moderate episode of recurrent major depressive disorder (HCC) 296.32 F33.1     Past Psychiatric History:  Outpatient: She was evaluated for ADD in [place] in 2014; started on Adderall since then. Psychiatry admission: denies  Previous suicide attempt: denies Past trials of medication: Celexa, Wellbutrin ("mean"), Prozac, Effexor, Mirtazapine, duloxetine, Adderall (4 years), Lyrica (swelling), lorazepam, valium  Past Medical History:  Past Medical History:  Diagnosis Date  . ADHD (attention deficit hyperactivity disorder)   . Arthritis   . Back pain   . Broken femur (HCC) 08/27/12  . Migraine   . Osteoarthritis   . Thyroid disease    hypothyroidism    Past Surgical History:  Procedure Laterality Date  . BACK SURGERY    . CERVICAL BIOPSY  W/ LOOP ELECTRODE EXCISION  1990's   in [place]  . ENDOMETRIAL BIOPSY  04-05-05 and 02-12-06   04-05-05 benign but too rapid growth. RXd Provera and repeat Bx 21mo. 02-12-06 Bx benign--Dr. Leda QuailSuzanne Miller  . FEMUR IM NAIL Left 08/27/2012   Procedure: INTRAMEDULLARY (IM) RETROGRADE FEMORAL NAILING;  Surgeon: Kathryne Hitchhristopher Y Blackman, MD;  Location: [facility];  Service: Orthopedics;  Laterality: Left;  . HEEL SPUR SURGERY    . IM NAILING FEMORAL SHAFT FRACTURE Left 08/27/2012   Dr Magnus IvanBLACKMAN  . TOTAL KNEE ARTHROPLASTY     x2  . TUBAL LIGATION      Family Psychiatric History:  Mother- bipolar, sister - bipolar,   Family History:  Family History  Problem Relation Age of Onset  . Cancer Mother   . Breast cancer Mother   . Heart failure Father   . Cancer Father   . Cancer Other   . Heart failure Other     Social History:  Social History   Social History  . Marital status: Single    Spouse name: N/A  . Number of children: N/A  . Years of education: N/A   Social History Main Topics  . Smoking status: Former Smoker    Years: 10.00    Types: Cigarettes    Quit date: 03/18/2004  . Smokeless tobacco: Never Used  . Alcohol use 0.0 oz/week     Comment: rarely  . Drug use: No  . Sexual activity: Not Currently    Partners: Female    Birth control/ protection:    Other Topics Concern  . None   Social History Narrative  . None   Additional Social History:  Education: graduated from [employer], Struggled during childhood  Work: Pharmacologistpharmacy technician as needed,  Lives by herself, she has three children and one grandchild   Allergies:  Allergies  Allergen Reactions  . Wellbutrin [Bupropion]     Anger issues  . Lyrica [Pregabalin] Swelling  Leg swelling    Metabolic Disorder Labs: Lab Results  Component Value Date   HGBA1C 5.6 07/22/2014   MPG 103 02/16/2013   MPG 111 07/24/2010   No results found for: PROLACTIN Lab Results  Component Value Date   CHOL 181 01/12/2016   TRIG 132 01/12/2016   HDL 47 01/12/2016   CHOLHDL 3.9 01/12/2016   VLDL 13 07/10/2012   LDLCALC 108 (H) 01/12/2016   LDLCALC 75 07/06/2015     Current Medications: Current Outpatient Prescriptions  Medication Sig Dispense Refill  . amphetamine-dextroamphetamine (ADDERALL XR) 30 MG 24 hr capsule 2 qam 60 capsule 0  . clobetasol ointment (TEMOVATE) 0.05 % Apply 1 application topically 2 (two) times daily. Apply as directed twice daily 60 g 1  .  Diphenhyd-Hydrocort-Nystatin (FIRST-DUKES MOUTHWASH) SUSP Take one tsp swish and spit qid 1 Bottle 0  . DULoxetine (CYMBALTA) 30 MG capsule Take 30 mg daily for two weeks, then 60 mg daily 60 capsule 0  . lidocaine (XYLOCAINE) 2 % solution Use bid prn 120 mL 0  . methocarbamol (ROBAXIN) 750 MG tablet Take 750 mg by mouth 2 (two) times daily as needed for muscle spasms.    Marland Kitchen. MOVANTIK 25 MG TABS tablet Take 1 tablet by mouth daily.  11  . nystatin (MYCOSTATIN/NYSTOP) powder Apply topically 2 (two) times daily. 45 g 0  . omeprazole (PRILOSEC) 40 MG capsule Take 1 capsule (40 mg total) by mouth daily. 30 capsule 11  . ondansetron (ZOFRAN ODT) 8 MG disintegrating tablet Take 1 tablet (8 mg total) by mouth every 8 (eight) hours as needed for nausea or vomiting. 20 tablet 2  . oxyCODONE-acetaminophen (PERCOCET) 10-325 MG tablet One q 4 hours prn Max 5 per day 150 tablet 0  . traZODone (DESYREL) 50 MG tablet Take 100 mg by mouth at bedtime.    . triamcinolone (KENALOG) 0.1 % paste APPLY TO THE AFFECTED AREA ON THE TONGUE TWICE DAILY AS NEEDED. 5 g 0  . Diclofenac Sodium CR 100 MG 24 hr tablet TAKE 1 TABLET BY MOUTH TWICE DAILY. TAKE WITH FOOD. 60 tablet 0  . Oxycodone HCl 10 MG TABS Take by mouth at bedtime.     No current facility-administered medications for this visit.     Neurologic: Headache: No Seizure: No Paresthesias: No  Musculoskeletal: Strength & Muscle Tone: within normal limits Gait & Station: normal Patient leans: N/A  Psychiatric Specialty Exam: Review of Systems  Musculoskeletal: Positive for back pain.  Neurological: Positive for tingling.  Psychiatric/Behavioral: Positive for depression. Negative for hallucinations, substance abuse and suicidal ideas. The patient is nervous/anxious. The patient does not have insomnia.   All other systems reviewed and are negative.   Blood pressure 131/86, pulse 77, height 5' 6.5" (1.689 m), weight 271 lb (122.9 kg), last menstrual period  03/19/2003, SpO2 93 %.Body mass index is 43.09 kg/m.  General Appearance: Fairly Groomed  Eye Contact:  Good  Speech:  Clear and Coherent  Volume:  Normal  Mood:  better  Affect:  Restricted and Tearful  Thought Process:  Coherent and Goal Directed  Orientation:  Full (Time, Place, and Person)  Thought Content: Logical  Perceptions: denies AH/VH  Suicidal Thoughts:  No  Homicidal Thoughts:  No  Memory:  Immediate;   Good Recent;   Good Remote;   Good  Judgement:  Good  Insight:  Fair  Psychomotor Activity:  Normal  Concentration:  Concentration: Good and Attention Span: Good  Recall:  Good  Fund of  Knowledge: Good  Language: Good  Akathisia:  No  Handed:  Right  AIMS (if indicated):  N/A  Assets:  Communication Skills Desire for Improvement  ADL's:  Intact  Cognition: WNL  Sleep:  good   Assessment Lauren Greene is a 50 year old female with depression, ADHD per chart, anxiety, hyperhydrosis, chronic pain who presents for depression. Patient is demoralized by her chronic pain with limited physical activity.   # MDD There has been slight improvement in her neurovegetative symptoms. Will uptitrate duloxetine to optimize its effect. Informed with patient that the dose is more than recommended dose for depression, although it is reasonable to uptitrate it given her pain. Will start diazepam per patient request for anxiety; will limit to lower dose to avoid dependence. Discussed risks of somnolence and respiratory suppression with concomitant use of opioids. Although she will greatly benefit from supportive therapy/CBT, she declines this option at this time. Will continue to discuss and provide validation as needed.   # ADHD She was diagnosed ADHD after evaluation in [facility]. Currently prescribed on Adderall by her PCP. She denies significant side effect which includes anxiety.   Plan 1. Increase duloxetine 90 mg daily for two weeks, then 120 mg daily 2. Continue  trazodone 100 mg at night as needed for sleep 3. Start diazepam 2 mg daily as needed for anxiety, 30 tabs with one refill 4. Return to clinic in February  The patient demonstrates the following risk factors for suicide: Chronic risk factors for suicide include: psychiatric disorder of depression and chronic pain. Acute risk factors for suicide include: loss (financial, interpersonal, professional). Protective factors for this patient include: responsibility to others (children, family), coping skills and hope for the future. Considering these factors, the overall suicide risk at this point appears to be low. Patient is appropriate for outpatient follow up.   Treatment Plan Summary: Plan as above    Neysa Hotter, MD 03/05/2016, 1:21 PM

## 2016-03-05 ENCOUNTER — Encounter (HOSPITAL_COMMUNITY): Payer: Self-pay | Admitting: Psychiatry

## 2016-03-05 ENCOUNTER — Ambulatory Visit (INDEPENDENT_AMBULATORY_CARE_PROVIDER_SITE_OTHER): Payer: BLUE CROSS/BLUE SHIELD | Admitting: Psychiatry

## 2016-03-05 VITALS — BP 131/86 | HR 77 | Ht 66.5 in | Wt 271.0 lb

## 2016-03-05 DIAGNOSIS — F331 Major depressive disorder, recurrent, moderate: Secondary | ICD-10-CM | POA: Diagnosis not present

## 2016-03-05 DIAGNOSIS — Z803 Family history of malignant neoplasm of breast: Secondary | ICD-10-CM | POA: Diagnosis not present

## 2016-03-05 DIAGNOSIS — Z87891 Personal history of nicotine dependence: Secondary | ICD-10-CM

## 2016-03-05 DIAGNOSIS — Z8249 Family history of ischemic heart disease and other diseases of the circulatory system: Secondary | ICD-10-CM | POA: Diagnosis not present

## 2016-03-05 DIAGNOSIS — Z888 Allergy status to other drugs, medicaments and biological substances status: Secondary | ICD-10-CM

## 2016-03-05 DIAGNOSIS — Z79891 Long term (current) use of opiate analgesic: Secondary | ICD-10-CM

## 2016-03-05 DIAGNOSIS — Z9889 Other specified postprocedural states: Secondary | ICD-10-CM

## 2016-03-05 DIAGNOSIS — Z79899 Other long term (current) drug therapy: Secondary | ICD-10-CM

## 2016-03-05 MED ORDER — DIAZEPAM 2 MG PO TABS: 2.0000 mg | ORAL_TABLET | Freq: Every day | ORAL | 1 refills | Status: DC | PRN

## 2016-03-05 MED ORDER — TRAZODONE HCL 100 MG PO TABS: 100.0000 mg | ORAL_TABLET | Freq: Every day | ORAL | 1 refills | Status: DC

## 2016-03-05 MED ORDER — DULOXETINE HCL 60 MG PO CPEP: ORAL_CAPSULE | ORAL | 1 refills | Status: DC

## 2016-03-05 MED ORDER — DULOXETINE HCL 30 MG PO CPEP: ORAL_CAPSULE | ORAL | 0 refills | Status: DC

## 2016-03-05 NOTE — Patient Instructions (Signed)
1. Increase duloxetine 90 mg daily for two weeks, then 120 mg daily 2. Continue trazodone 100 mg at night as needed for sleep 3. Start diazepam 2 mg daily as needed for anxiety 4. Return to clinic in February

## 2016-03-12 ENCOUNTER — Other Ambulatory Visit: Payer: Self-pay | Admitting: Family Medicine

## 2016-03-13 NOTE — Telephone Encounter (Signed)
She may have this +5 refills 

## 2016-03-14 ENCOUNTER — Ambulatory Visit (INDEPENDENT_AMBULATORY_CARE_PROVIDER_SITE_OTHER): Payer: BLUE CROSS/BLUE SHIELD | Admitting: Obstetrics & Gynecology

## 2016-03-14 VITALS — BP 120/80 | HR 68 | Resp 12 | Ht 65.5 in | Wt 269.4 lb

## 2016-03-14 DIAGNOSIS — N814 Uterovaginal prolapse, unspecified: Secondary | ICD-10-CM

## 2016-03-14 NOTE — Progress Notes (Signed)
GYNECOLOGY  VISIT   HPI: 50 y.o. [id number] Partnered Caucasian female with complaint of vulvar bump that she recently noticed.   At first, she felt it and then had her partner look who also confirmed it.  She is not sure if it on the outside or inside of the vagina.  She denies vaginal bleeding.  She's been menopausal since 2005.  She was briefly on HRT for a short while but is not on HRT at this time.  She denies vaginal discharge or pain.    She does have issues with incontinence but this has been a long term issue with her.  She has seen Dr. Logan BoresEvans in the past but it has been several years.  She has also been on anti-cholinergics in the past without much success with help.    GYNECOLOGIC HISTORY: Patient's last menstrual period was 03/19/2003. Contraception: PMP Menopausal hormone therapy: none  Patient Active Problem List   Diagnosis Date Noted  . Moderate episode of recurrent major depressive disorder (HCC) 02/01/2016  . 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 Osteoarthritis 08/16/2015  . Morbid obesity (HCC) 08/07/2015  . Elevated blood pressure 01/31/2014  . Arthritis 06/24/2013  . Myalgia and myositis 06/24/2013  . Chronic back pain 02/16/2013  . Depression, major, in remission (HCC) 01/05/2013  . Chronic arthralgias of knees and hips 01/05/2013  . 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 ADD (attention deficit disorder) 07/08/2012    Past Medical History:  Diagnosis Date  . ADHD (attention deficit hyperactivity disorder)   . Arthritis   . Back pain   . Broken femur (HCC) 08/27/12  . Migraine   . Osteoarthritis   . Thyroid disease    hypothyroidism    Past Surgical History:  Procedure Laterality Date  . BACK SURGERY    . CERVICAL BIOPSY  W/ LOOP ELECTRODE EXCISION  1990's   in [place], [place]  . ENDOMETRIAL BIOPSY  04-05-05 and 02-12-06   04-05-05 benign but too rapid growth. RXd Provera and repeat Bx 73mo. 02-12-06 Bx benign--Dr.  Leda QuailSuzanne Emeril Stille  . FEMUR IM NAIL Left 08/27/2012   Procedure: INTRAMEDULLARY (IM) RETROGRADE FEMORAL NAILING;  Surgeon: Kathryne Hitchhristopher Y Blackman, MD;  Location: [facility];  Service: Orthopedics;  Laterality: Left;  . HEEL SPUR SURGERY    . IM NAILING FEMORAL SHAFT FRACTURE Left 08/27/2012   Dr Magnus IvanBLACKMAN  . TOTAL KNEE ARTHROPLASTY     x2  . TUBAL LIGATION      MEDS:  Reviewed in EPIC and UTD  ALLERGIES: Wellbutrin [bupropion] and Lyrica [pregabalin]  Family History  Problem Relation Age of Onset  . Cancer Mother   . Breast cancer Mother   . Heart failure Father   . Cancer Father   . Cancer Other   . Heart failure Other     SH:  Non smoker, partnered  Review of Systems  All other systems reviewed and are negative.   PHYSICAL EXAMINATION:    BP 120/80 (BP Location: Right Arm, Patient Position: Sitting, Cuff Size: Normal)   Pulse 68   Resp 12   Ht 5' 5.5" (1.664 m)   Wt 269 lb 6.4 oz (122.2 kg)   LMP 03/19/2003   BMI 44.15 kg/m     General appearance: alert, cooperative and appears stated age Abdomen: soft, non-tender; bowel sounds normal; no masses,  no organomegaly  Pelvic: External genitalia:  no lesions              Urethra:  normal  appearing urethra with no masses, tenderness or lesions              Bartholins and Skenes: normal                 Vagina: normal appearing vagina with normal color and discharge, 3rd degree cystocele noted, no lesions              Cervix: no lesions              Bimanual Exam:  Uterus:  normal size, contour, position, consistency, mobility, non-tender and 2nd degree uterine prolapse noted as well              Adnexa: no mass, fullness, tenderness              Rectovaginal: Yes.  .  Confirms.              Anus:  normal sphincter tone, no lesions  Chaperone was present for exam.  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  She is most interested in this and is aware hysterectomy will be needed at the same time.  She is aware urology will be needed if I do the surgery or she could be referred to uro-gynecology.  She has seen Dr. Logan BoresEvans in the past so feels comfortable with [facility].  Will plan referral to Dr. Ashley RoyaltyMatthews for consideration of surgery.     ~25 minutes spent with patient >50% of time was in face to face discussion of above.

## 2016-03-17 ENCOUNTER — Encounter: Payer: Self-pay | Admitting: Obstetrics & Gynecology

## 2016-03-18 HISTORY — PX: VAGINAL HYSTERECTOMY: SUR661

## 2016-03-29 ENCOUNTER — Telehealth: Payer: Self-pay | Admitting: Obstetrics & Gynecology

## 2016-03-29 NOTE — Telephone Encounter (Signed)
Patient was seen recently for Incomplete uterovaginal prolapse and feels this is getting worse. Patient said we referred her to another physician and her appointment is at the end of the month. Patient is concerned because she feels her prolapse is getting worse.

## 2016-03-29 NOTE — Telephone Encounter (Signed)
Spoke with patient after speaking with nursing supervisor about schedule. Patient is scheduled for 04/02/16 at 10am with Dr. Hyacinth MeekerMiller. Advised patient Dr. Hyacinth MeekerMiller is in surgery on Monday morning, will return call to patient if appt becomes available d/t cancellation or Dr. Hyacinth MeekerMiller is out of surgery earlier. Patient verbalizes understanding and is agreeable.  Routing to provider for final review. Patient is agreeable to disposition. Will close encounter.

## 2016-03-29 NOTE — Telephone Encounter (Signed)
If she wants to consider using a pessary until then, I could try to fit her for one until her appt in Feb.

## 2016-03-29 NOTE — Telephone Encounter (Signed)
Spoke with Victorino D[facility]Jennifer at [facility] -Dr. Ashley RoyaltyMatthews office. Called to see if patients appointment can be moved up. Was advised [facility]. Was advised no earlier availability for earlier [facility] or [facility]. Was advised can put on wait list and call patient if something becomes available sooner. Quincy Sheehandvised Jennifer, will return call to patient to update.

## 2016-03-29 NOTE — Telephone Encounter (Signed)
Spoke with patient, advised as seen below per Dr. Hyacinth MeekerMiller. Patient states she would like to try pessary.  Patient is available for Monday 1/15. Advised patient no availability on Dr. Garen LahMillers schedule, will need to review with Dr. Hyacinth MeekerMiller for scheduling and return call. Patient is agreeable.  Dr. Hyacinth MeekerMiller, please advise on schedule for 1/15?

## 2016-03-29 NOTE — Telephone Encounter (Signed)
Spoke with patient, advised as seen below. Patient states uterus has fallen more than at last OV with Dr. Hyacinth MeekerMiller 03/14/16. Patient states uterus is now sticking out of vagina, but not completley. Patient states when she spoke with Dr. Hyacinth MeekerMiller at last OV she new nothing much else could be done until appt with Dr. Ashley RoyaltyMatthews. Patient states if Dr. Hyacinth MeekerMiller has any other recommendations to return a call, if not, no return call needed as she is at work and can not accept personal calls.   Dr. Hyacinth MeekerMiller, any additional recommendations?

## 2016-04-02 ENCOUNTER — Encounter: Payer: Self-pay | Admitting: Obstetrics & Gynecology

## 2016-04-02 ENCOUNTER — Ambulatory Visit (INDEPENDENT_AMBULATORY_CARE_PROVIDER_SITE_OTHER): Payer: BLUE CROSS/BLUE SHIELD | Admitting: Obstetrics & Gynecology

## 2016-04-02 VITALS — BP 140/78 | HR 80 | Resp 16 | Ht 66.5 in | Wt 269.0 lb

## 2016-04-02 DIAGNOSIS — N814 Uterovaginal prolapse, unspecified: Secondary | ICD-10-CM

## 2016-04-03 DIAGNOSIS — N814 Uterovaginal prolapse, unspecified: Secondary | ICD-10-CM | POA: Insufficient documentation

## 2016-04-03 NOTE — Progress Notes (Signed)
51 y.o. Lauren [id number] Partnered Caucasian female who was diagnosed with 3rd degree cystocele on 03/14/16 is here for possible pessary fitting due to worsening symptoms.  She can feel her baldder, at times, protrude through the vagina.  She has associated pressure and fullness with this and feels like she has to urinate much more frequently.  She denies dysuria or increased odor with urination.  Pt does have [facility] but this is in February.  She woud like something to try and help her symptoms, currently, if possible.  She is here for possible pessary fitting.  She is willing to try this if it will help until she has her appt with Dr. Ashley RoyaltyMatthews.    Denies vaginal discharge or vaginal bleeding.  Does not have any "new" back or pelvic pain.    Past Medical History:  Diagnosis Date  . ADHD (attention deficit hyperactivity disorder)   . Arthritis   . Back pain   . Broken femur (HCC) 08/27/12  . Migraine   . Osteoarthritis   . Thyroid disease    hypothyroidism   Past Surgical History:  Procedure Laterality Date  . BACK SURGERY    . CERVICAL BIOPSY  W/ LOOP ELECTRODE EXCISION  1990's   in [place]  . ENDOMETRIAL BIOPSY  04-05-05 and 02-12-06   04-05-05 benign but too rapid growth. RXd Provera and repeat Bx 75mo. 02-12-06 Bx benign--Dr. Leda QuailSuzanne Nadie Fiumara  . FEMUR IM NAIL Left 08/27/2012   Procedure: INTRAMEDULLARY (IM) RETROGRADE FEMORAL NAILING;  Surgeon: Kathryne Hitchhristopher Y Blackman, MD;  Location: [facility] OR;  Service: Orthopedics;  Laterality: Left;  . HEEL SPUR SURGERY    . IM NAILING FEMORAL SHAFT FRACTURE Left 08/27/2012   Dr Magnus IvanBLACKMAN  . TOTAL KNEE ARTHROPLASTY     x2  . TUBAL LIGATION     Allergies  Allergen Reactions  . Wellbutrin [Bupropion]     Anger issues  . Lyrica [Pregabalin] Swelling    Leg swelling   Current Outpatient Prescriptions on File Prior to Visit  Medication Sig Dispense Refill  . amphetamine-dextroamphetamine (ADDERALL XR) 30 MG 24 hr capsule 2 qam 60 capsule 0   . clobetasol ointment (TEMOVATE) 0.05 % Apply 1 application topically 2 (two) times daily. Apply as directed twice daily 60 g 1  . diazepam (VALIUM) 2 MG tablet Take 1 tablet (2 mg total) by mouth daily as needed for anxiety. 30 tablet 1  . Diclofenac Sodium CR 100 MG 24 hr tablet TAKE 1 TABLET BY MOUTH TWICE DAILY. TAKE WITH FOOD. 60 tablet 5  . Diphenhyd-Hydrocort-Nystatin (FIRST-DUKES MOUTHWASH) SUSP Take one tsp swish and spit qid 1 Bottle 0  . DULoxetine (CYMBALTA) 60 MG capsule Take 90 mg (30 mg+60 mg) for two weeks then increase to 120 mg daily 60 capsule 1  . lidocaine (XYLOCAINE) 2 % solution Use bid prn 120 mL 0  . methocarbamol (ROBAXIN) 750 MG tablet Take 750 mg by mouth 2 (two) times daily as needed for muscle spasms.    Marland Kitchen. MOVANTIK 25 MG TABS tablet Take 1 tablet by mouth daily.  11  . nystatin (MYCOSTATIN/NYSTOP) powder Apply topically 2 (two) times daily. 45 g 0  . omeprazole (PRILOSEC) 40 MG capsule Take 1 capsule (40 mg total) by mouth daily. 30 capsule 11  . ondansetron (ZOFRAN ODT) 8 MG disintegrating tablet Take 1 tablet (8 mg total) by mouth every 8 (eight) hours as needed for nausea or vomiting. 20 tablet 2  . oxyCODONE-acetaminophen (PERCOCET) 10-325 MG  tablet One q 4 hours prn Max 5 per day 150 tablet 0  . traZODone (DESYREL) 100 MG tablet Take 1 tablet (100 mg total) by mouth at bedtime. 30 tablet 1  . triamcinolone (KENALOG) 0.1 % paste APPLY TO THE AFFECTED AREA ON THE TONGUE TWICE DAILY AS NEEDED. 5 g 0   No current facility-administered medications on file prior to visit.    Review of Systems  All other systems reviewed and are negative.   Exam: BP 140/78 (BP Location: Right Arm, Patient Position: Sitting, Cuff Size: Large)   Pulse 80   Resp 16   Ht 5' 6.5" (1.689 m)   Wt 269 lb (122 kg)   LMP 03/19/2003   BMI 42.77 kg/m    General appearance: alert, cooperative and NAD Abd:  Soft, ND, NT, normal BS Lymph:  No inguinal LAD  Pelvic: External genitalia:   no lesions              Urethra: normal appearing urethra with no masses, tenderness or lesions              Bartholins and Skenes: Bartholin's, Urethra, Skene's normal                 Vagina: normal appearing vagina with normal color and discharge, no lesions, 4th degree cystocele with Valsalva, associated 3rd degree cystocele noted              Cervix: normal appearance and parous Bimanual Exam:  Uterus:  uterus is normal size, shape, consistency and nontender                               Adnexa:    normal adnexa in size, nontender and no masses                               Anus:  No lesions  Procedure:  Patient fitted with the following pessary and sizes:  #3 incontinence ring with support, which was too small and came out easily.  #4 incontinence ring with support which had good fit but the fitting pessary was too soft and the pt could valsalva this out.  #5 incontinence ring with support was too large.  Then 3 1/4 inch Gelhorn pessary place but the stem is too long and she can feel it at the introitus.  I have a Milex #4 incontinence ring with support so this was placed.  Pt comfortable with this.  With voiding and straining, it does come down but she can push it back up.  As that is all that I have in the office at this time and she desires to use "something" to help, will plan to order #4 incontinence ring that is more stuff to see if this will stay.    Assessment:   Cystocele, 4th degree Associated 3rd degree uterine prolapse OAB  Plan: Pessary will be ordered for patient and she will return when it arrives.  We may need to increase pessary size after she's used the #4 for a little while.  Pt knows if the one she has just comes down too much, she can just remove it.

## 2016-04-15 ENCOUNTER — Encounter: Payer: Self-pay | Admitting: Obstetrics & Gynecology

## 2016-04-15 ENCOUNTER — Telehealth: Payer: Self-pay | Admitting: Obstetrics & Gynecology

## 2016-04-15 NOTE — Telephone Encounter (Signed)
Patient checking on the status of the pessary that was to be ordered for her.

## 2016-04-16 NOTE — Telephone Encounter (Signed)
Reviewed with Billie RuddySally Yeakley, RN. Pessary for patient is in the office and patient may be seen to have it placed at this time. Patient states that she has changed her mind and does not desire to have pessary placed at this time. Would like to wait for her appointment with Dr.Matthews on 04/24/2016. Patient has notified Dr.Miller via MyChart (see encounter dated 04/15/2016).  Routing to provider for final review. Patient agreeable to disposition. Will close encounter.

## 2016-04-23 NOTE — Progress Notes (Signed)
BH MD/PA/NP OP Progress Note  04/25/2016 2:52 PM Lauren Greene  MRN:  [id number]  Chief Complaint:  Chief Complaint    Anxiety; Depression; Follow-up     Subjective:  "I'm certainly feeling better" HPI:  Patient presents for follow-up appointment. She states that she is doing better; she feels more motivated and has been trying to stay busy. She believes that she will be 100% if she can do more exercise. She has started to see other pain provider and was prescribed on opioids. Although she reports insomnia with night time awakening, she would like to see how her insomnia resolves as her pain improves. She denies SI.   Visit Diagnosis:    ICD-9-CM ICD-10-CM   1. Moderate episode of recurrent major depressive disorder (HCC) 296.32 F33.1     Past Psychiatric History:  Outpatient: She was evaluated for ADD in [place] in 2014; started on Adderall since then. Psychiatry admission: denies  Previous suicide attempt: denies Past trials of medication: Celexa, Wellbutrin ("mean"), Prozac, Effexor, Mirtazapine, duloxetine, Adderall (4 years), Lyrica (swelling), lorazepam, valium  Past Medical History:  Past Medical History:  Diagnosis Date  . ADHD (attention deficit hyperactivity disorder)   . Arthritis   . Back pain   . Broken femur (HCC) 08/27/12  . Migraine   . Osteoarthritis   . Thyroid disease    hypothyroidism    Past Surgical History:  Procedure Laterality Date  . BACK SURGERY    . CERVICAL BIOPSY  W/ LOOP ELECTRODE EXCISION  1990's   in [place]  . ENDOMETRIAL BIOPSY  04-05-05 and 02-12-06   04-05-05 benign but too rapid growth. RXd Provera and repeat Bx 68mo. 02-12-06 Bx benign--Dr. Leda QuailSuzanne Miller  . FEMUR IM NAIL Left 08/27/2012   Procedure: INTRAMEDULLARY (IM) RETROGRADE FEMORAL NAILING;  Surgeon: Kathryne Hitchhristopher Y Blackman, MD;  Location: [facility] OR;  Service: Orthopedics;  Laterality: Left;  . HEEL SPUR SURGERY    . IM NAILING FEMORAL SHAFT FRACTURE Left 08/27/2012   Dr  Magnus IvanBLACKMAN  . TOTAL KNEE ARTHROPLASTY     x2  . TUBAL LIGATION      Family Psychiatric History:  Mother- bipolar, sister - bipolar,   Family History:  Family History  Problem Relation Age of Onset  . Cancer Mother   . Breast cancer Mother   . Heart failure Father   . Cancer Father   . Cancer Other   . Heart failure Other     Social History:  Social History   Social History  . Marital status: Single    Spouse name: N/A  . Number of children: N/A  . Years of education: N/A   Social History Main Topics  . Smoking status: Former Smoker    Years: 10.00    Types: Cigarettes    Quit date: 03/18/2004  . Smokeless tobacco: Never Used  . Alcohol use 0.0 oz/week     Comment: rarely  . Drug use: No  . Sexual activity: Not Currently    Partners: Female    Birth control/ protection:    Other Topics Concern  . None   Social History Narrative  . None   Additional Social History:  Education: graduated from [employer], Struggled during childhood  Work: Pharmacologistpharmacy technician as needed,  Lives by herself, she has three children and one grandchild   Allergies:  Allergies  Allergen Reactions  . Wellbutrin [Bupropion]     Anger issues  . Lyrica [Pregabalin] Swelling    Leg swelling  Metabolic Disorder Labs: Lab Results  Component Value Date   HGBA1C 5.6 07/22/2014   MPG 103 02/16/2013   MPG 111 07/24/2010   No results found for: PROLACTIN Lab Results  Component Value Date   CHOL 181 01/12/2016   TRIG 132 01/12/2016   HDL 47 01/12/2016   CHOLHDL 3.9 01/12/2016   VLDL 13 07/10/2012   LDLCALC 108 (H) 01/12/2016   LDLCALC 75 07/06/2015     Current Medications: Current Outpatient Prescriptions  Medication Sig Dispense Refill  . amphetamine-dextroamphetamine (ADDERALL XR) 30 MG 24 hr capsule 2 qam 60 capsule 0  . clobetasol ointment (TEMOVATE) 0.05 % Apply 1 application topically 2 (two) times daily. Apply as directed twice daily 60 g 1  . diazepam (VALIUM) 2 MG  tablet Take 1 tablet (2 mg total) by mouth daily as needed for anxiety. 30 tablet 1  . Diclofenac Sodium CR 100 MG 24 hr tablet TAKE 1 TABLET BY MOUTH TWICE DAILY. TAKE WITH FOOD. 60 tablet 5  . Diphenhyd-Hydrocort-Nystatin (FIRST-DUKES MOUTHWASH) SUSP Take one tsp swish and spit qid 1 Bottle 0  . DULoxetine (CYMBALTA) 60 MG capsule Take 2 capsules (120 mg total) by mouth daily. 120 capsule 1  . lidocaine (XYLOCAINE) 2 % solution Use bid prn 120 mL 0  . methocarbamol (ROBAXIN) 750 MG tablet Take 750 mg by mouth 2 (two) times daily as needed for muscle spasms.    Marland Kitchen MOVANTIK 25 MG TABS tablet Take 1 tablet by mouth daily.  11  . nystatin (MYCOSTATIN/NYSTOP) powder Apply topically 2 (two) times daily. 45 g 0  . omeprazole (PRILOSEC) 40 MG capsule Take 1 capsule (40 mg total) by mouth daily. 30 capsule 11  . ondansetron (ZOFRAN ODT) 8 MG disintegrating tablet Take 1 tablet (8 mg total) by mouth every 8 (eight) hours as needed for nausea or vomiting. 20 tablet 2  . oxyCODONE-acetaminophen (PERCOCET) 10-325 MG tablet One q 4 hours prn Max 5 per day 150 tablet 0  . traZODone (DESYREL) 100 MG tablet Take 1 tablet (100 mg total) by mouth at bedtime. 30 tablet 1  . triamcinolone (KENALOG) 0.1 % paste APPLY TO THE AFFECTED AREA ON THE TONGUE TWICE DAILY AS NEEDED. 5 g 0   No current facility-administered medications for this visit.     Neurologic: Headache: No Seizure: No Paresthesias: No  Musculoskeletal: Strength & Muscle Tone: within normal limits Gait & Station: normal Patient leans: N/A  Psychiatric Specialty Exam: Review of Systems  Musculoskeletal: Positive for back pain.  Neurological: Positive for tingling.  Psychiatric/Behavioral: Positive for depression. Negative for hallucinations, substance abuse and suicidal ideas. The patient is nervous/anxious and has insomnia.   All other systems reviewed and are negative.   Blood pressure 140/81, pulse 67, height 5' 6.5" (1.689 m), weight 269  lb 12.8 oz (122.4 kg), last menstrual period 03/18/2004.Body mass index is 42.89 kg/m.  General Appearance: Fairly Groomed  Eye Contact:  Good  Speech:  Clear and Coherent  Volume:  Normal  Mood:  better  Affect:  Appropriate, Congruent and Full Range  Thought Process:  Coherent and Goal Directed  Orientation:  Full (Time, Place, and Person)  Thought Content: Logical  Perceptions: denies AH/VH  Suicidal Thoughts:  No  Homicidal Thoughts:  No  Memory:  Immediate;   Good Recent;   Good Remote;   Good  Judgement:  Good  Insight:  Fair  Psychomotor Activity:  Normal  Concentration:  Concentration: Good and Attention Span: Good  Recall:  Good  Fund of Knowledge: Good  Language: Good  Akathisia:  No  Handed:  Right  AIMS (if indicated):  N/A  Assets:  Communication Skills Desire for Improvement  ADL's:  Intact  Cognition: WNL  Sleep:  good   Assessment Lauren Greene is a 51 year old female with depression, ADHD per chart, anxiety, hyperhydrosis, chronic pain who presents for depression. Patient is demoralized by her chronic pain with limited physical activity.   # MDD There has been improvement in her neurovegetative symptoms since uptitration of duloxetine. Will continue current dose. Will continue to have trazodone prn for insomnia and valium prn for anxiety. Discussed risks of somnolence and respiratory suppression, especially with concomitant use of opioids.  # ADHD She was diagnosed ADHD after evaluation in [facility]. Currently prescribed on Adderall by her PCP. She denies significant side effect which includes anxiety.   Plan 1. Continue duloxetine 120 mg daily 2. Continue trazodone 100 mg at night as needed for sleep 3 .Continue diazepam 2 mg daily as needed for anxiety 4. Return to clinic in one month  The patient demonstrates the following risk factors for suicide: Chronic risk factors for suicide include: psychiatric disorder of depression and chronic  pain. Acute risk factors for suicide include: loss (financial, interpersonal, professional). Protective factors for this patient include: responsibility to others (children, family), coping skills and hope for the future. Considering these factors, the overall suicide risk at this point appears to be low. Patient is appropriate for outpatient follow up.   Treatment Plan Summary: Plan as above    Neysa Hotter, MD 04/25/2016, 2:52 PM

## 2016-04-24 ENCOUNTER — Telehealth: Payer: Self-pay | Admitting: Obstetrics & Gynecology

## 2016-04-24 NOTE — Telephone Encounter (Signed)
Patient was seen today by Dr. Ashley Royal[facility]Matthews at [facility].  Dr. Ashley RoyaltyMatthews would like to speak with Dr. Hyacinth MeekerMiller at earliest convenience.  Aware that Dr. Hyacinth MeekerMiller is out of the office this afternoon. OK to wait until tomorrow for call back.  Dr. Ashley RoyaltyMatthews # [phone].

## 2016-04-25 ENCOUNTER — Ambulatory Visit (INDEPENDENT_AMBULATORY_CARE_PROVIDER_SITE_OTHER): Payer: BLUE CROSS/BLUE SHIELD | Admitting: Psychiatry

## 2016-04-25 ENCOUNTER — Encounter (HOSPITAL_COMMUNITY): Payer: Self-pay | Admitting: Psychiatry

## 2016-04-25 VITALS — BP 140/81 | HR 67 | Ht 66.5 in | Wt 269.8 lb

## 2016-04-25 DIAGNOSIS — Z9851 Tubal ligation status: Secondary | ICD-10-CM

## 2016-04-25 DIAGNOSIS — Z79891 Long term (current) use of opiate analgesic: Secondary | ICD-10-CM

## 2016-04-25 DIAGNOSIS — Z888 Allergy status to other drugs, medicaments and biological substances status: Secondary | ICD-10-CM

## 2016-04-25 DIAGNOSIS — Z803 Family history of malignant neoplasm of breast: Secondary | ICD-10-CM

## 2016-04-25 DIAGNOSIS — Z809 Family history of malignant neoplasm, unspecified: Secondary | ICD-10-CM

## 2016-04-25 DIAGNOSIS — Z9889 Other specified postprocedural states: Secondary | ICD-10-CM | POA: Diagnosis not present

## 2016-04-25 DIAGNOSIS — Z79899 Other long term (current) drug therapy: Secondary | ICD-10-CM

## 2016-04-25 DIAGNOSIS — Z87891 Personal history of nicotine dependence: Secondary | ICD-10-CM

## 2016-04-25 DIAGNOSIS — F331 Major depressive disorder, recurrent, moderate: Secondary | ICD-10-CM

## 2016-04-25 DIAGNOSIS — Z8249 Family history of ischemic heart disease and other diseases of the circulatory system: Secondary | ICD-10-CM

## 2016-04-25 MED ORDER — TRAZODONE HCL 100 MG PO TABS: 100.0000 mg | ORAL_TABLET | Freq: Every day | ORAL | 1 refills | Status: DC

## 2016-04-25 MED ORDER — DULOXETINE HCL 60 MG PO CPEP: 120.0000 mg | ORAL_CAPSULE | Freq: Every day | ORAL | 1 refills | Status: DC

## 2016-04-25 NOTE — Patient Instructions (Signed)
1. Continue duloxetine 120 mg daily 2. Continue trazodone 100 mg at night as needed for sleep 3 .Continue diazepam 2 mg daily as needed for anxiety 4. Return to clinic in one month

## 2016-05-06 ENCOUNTER — Encounter: Payer: Self-pay | Admitting: Obstetrics & Gynecology

## 2016-05-07 NOTE — Telephone Encounter (Signed)
After several attempts to reach Dr. Ashley RoyaltyMatthews, I contacted the patient personally.  Pt thought that Dr. Ashley RoyaltyMatthews could only repair her bladder and not do the hysterectomy.  Advised pt that Dr. Molli HazardMatthew will do all of her surgery.  Pt voiced understanding and appreciative of phone call.  Ok to close encounter.

## 2016-05-22 NOTE — Progress Notes (Signed)
BH MD/PA/NP OP Progress Note  05/23/2016 2:07 PM Lauren Greene  MRN:  [id number]  Chief Complaint:  Chief Complaint    Depression; Follow-up     Subjective:  "I'm concerned about my blood pressure" HPI:  Patient presents for follow-up appointment. She states that she is concerned about her hypertension. She stated that she has never had issues with BP and would like to change her medication if it affects her BP. 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 She feels more motivated and arranged her room to enjoy craft with her granddaughter. Although she continues to have pain, she likes to move around and do something. She occasionally feels anxious and took Valium 6 times since the last encounter. She denies difficulty in concentration as long as she is on Adderall. She sleeps better with taking Trazodone 100-200 mg at night. She denies SI. She denies headache or confusion.  Visit Diagnosis:    ICD-9-CM ICD-10-CM   1. Moderate episode of recurrent major depressive disorder (HCC) 296.32 F33.1     Past Psychiatric History:  Outpatient: She was evaluated for ADD in [place] in 2014; started on Adderall since then. Psychiatry admission: denies  Previous suicide attempt: denies Past trials of medication: Celexa, Wellbutrin ("mean"), Prozac, Effexor, Mirtazapine, duloxetine, Adderall (4 years), gabapentin, Lyrica (swelling), lorazepam, valium  Past Medical History:  Past Medical History:  Diagnosis Date  . ADHD (attention deficit hyperactivity disorder)   . Arthritis   . Back pain   . Broken femur (HCC) 08/27/12  . Migraine   . Osteoarthritis   . Thyroid disease    hypothyroidism    Past Surgical History:  Procedure Laterality Date  . BACK SURGERY    . CERVICAL BIOPSY  W/ LOOP ELECTRODE EXCISION  1990's   in [place], [place]  . ENDOMETRIAL BIOPSY  04-05-05 and 02-12-06   04-05-05 benign but too rapid growth. RXd Provera and repeat  Bx 75mo. 02-12-06 Bx benign--Dr. Leda Quail  . FEMUR IM NAIL Left 08/27/2012   Procedure: INTRAMEDULLARY (IM) RETROGRADE FEMORAL NAILING;  Surgeon: Kathryne Hitch, MD;  Location: [facility] OR;  Service: Orthopedics;  Laterality: Left;  . HEEL SPUR SURGERY    . IM NAILING FEMORAL SHAFT FRACTURE Left 08/27/2012   Dr Magnus Ivan  . TOTAL KNEE ARTHROPLASTY     x2  . TUBAL LIGATION      Family Psychiatric History:  Mother- bipolar, sister - bipolar,   Family History:  Family History  Problem Relation Age of Onset  . Cancer Mother   . Breast cancer Mother   . Heart failure Father   . Cancer Father   . Cancer Other   . Heart failure Other     Social History:  Social History   Social History  . Marital status: Single    Spouse name: N/A  . Number of children: N/A  . Years of education: N/A   Social History Main Topics  . Smoking status: Former Smoker    Years: 10.00    Types: Cigarettes    Quit date: 03/18/2004  . Smokeless tobacco: Never Used  . Alcohol use 0.0 oz/week     Comment: rarely  . Drug use: No  . Sexual activity: Not Currently    Partners: Female    Birth control/ protection:    Other Topics Concern  . None   Social History Narrative  . 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 Wellbutrin [Bupropion]     Anger issues  . Lyrica [Pregabalin] Swelling    Leg swelling    Metabolic Disorder Labs: Lab Results  Component Value Date   HGBA1C 5.6 07/22/2014   MPG 103 02/16/2013   MPG 111 07/24/2010   No results found for: PROLACTIN Lab Results  Component Value Date   CHOL 181 01/12/2016   TRIG 132 01/12/2016   HDL 47 01/12/2016   CHOLHDL 3.9 01/12/2016   VLDL 13 07/10/2012   LDLCALC 108 (H) 01/12/2016   LDLCALC 75 07/06/2015     Current  Medications: Current Outpatient Prescriptions  Medication Sig Dispense Refill  . amphetamine-dextroamphetamine (ADDERALL XR) 30 MG 24 hr capsule 2 qam 60 capsule 0  . clobetasol ointment (TEMOVATE) 0.05 % Apply 1 application topically 2 (two) times daily. Apply as directed twice daily 60 g 1  . diazepam (VALIUM) 2 MG tablet Take 1 tablet (2 mg total) by mouth daily as needed for anxiety. 30 tablet 1  . Diclofenac Sodium CR 100 MG 24 hr tablet TAKE 1 TABLET BY MOUTH TWICE DAILY. TAKE WITH FOOD. 60 tablet 5  . Diphenhyd-Hydrocort-Nystatin (FIRST-DUKES MOUTHWASH) SUSP Take one tsp swish and spit qid 1 Bottle 0  . DULoxetine (CYMBALTA) 60 MG capsule Take 2 capsules (120 mg total) by mouth daily. 120 capsule 1  . lidocaine (XYLOCAINE) 2 % solution Use bid prn 120 mL 0  . methocarbamol (ROBAXIN) 750 MG tablet Take 750 mg by mouth 2 (two) times daily as needed for muscle spasms.    Marland Kitchen MOVANTIK 25 MG TABS tablet Take 1 tablet by mouth daily.  11  . nystatin (MYCOSTATIN/NYSTOP) powder Apply topically 2 (two) times daily. 45 g 0  . omeprazole (PRILOSEC) 40 MG capsule Take 1 capsule (40 mg total) by mouth daily. 30 capsule 11  . ondansetron (ZOFRAN ODT) 8 MG disintegrating tablet Take 1 tablet (8 mg total) by mouth every 8 (eight) hours as needed for nausea or vomiting. 20 tablet 2  . oxyCODONE-acetaminophen (PERCOCET) 10-325 MG tablet One q 4 hours prn Max 5 per day 150 tablet 0  . tolterodine (DETROL LA) 4 MG 24 hr capsule Take 4 mg by mouth daily.    . traZODone (DESYREL) 100 MG tablet Take 1 tablet (100 mg total) by mouth at bedtime. 30 tablet 1  . triamcinolone (KENALOG) 0.1 % paste APPLY TO THE AFFECTED AREA ON THE TONGUE TWICE DAILY AS NEEDED. 5 g 0  . OXYCONTIN 15 MG 12 hr tablet Take 1 tablet by mouth 2 (two) times daily.  0  . sertraline (ZOLOFT) 50 MG tablet Start 50 mg daily for one week, then 100 mg daily for one week, then 150 mg daily 90 tablet 1   No current facility-administered  medications for this visit.     Neurologic: Headache: No Seizure: No Paresthesias: No  Musculoskeletal: Strength & Muscle Tone: within normal limits Gait & Station: normal Patient leans: N/A  Psychiatric Specialty Exam: Review of Systems  Musculoskeletal: Positive for back pain.  Neurological: Positive for tingling.  Psychiatric/Behavioral: Negative for depression, hallucinations, substance abuse and suicidal ideas. The patient is nervous/anxious and has insomnia.   All other systems reviewed and are negative.   Blood pressure (!) 179/106, pulse 76, height 5\' 6"  (1.676 m), weight 268 lb 9.6  oz (121.8 kg), last menstrual period 03/18/2004.Body mass index is 43.35 kg/m.  General Appearance: Fairly Groomed  Eye Contact:  Good  Speech:  Clear and Coherent  Volume:  Normal  Mood:  better  Affect:  Appropriate, Congruent and Full Range  Thought Process:  Coherent and Goal Directed  Orientation:  Full (Time, Place, and Person)  Thought Content: Logical  Perceptions: denies AH/VH  Suicidal Thoughts:  No  Homicidal Thoughts:  No  Memory:  Immediate;   Good Recent;   Good Remote;   Good  Judgement:  Good  Insight:  Fair  Psychomotor Activity:  Normal  Concentration:  Concentration: Good and Attention Span: Good  Recall:  Good  Fund of Knowledge: Good  Language: Good  Akathisia:  No  Handed:  Right  AIMS (if indicated):  N/A  Assets:  Communication Skills Desire for Improvement  ADL's:  Intact  Cognition: WNL  Sleep:  good   Assessment Melburn Hakelaine G Barnwell is a 51 year old female with depression, ADHD per chart, anxiety, hyperhydrosis, chronic pain who presents for depression. Patient is demoralized by her chronic pain with limited physical activity.   # MDD Although she responded very well to duloxetine, there is concerning adverse reaction of hypertension. Will cross taper from dulxoetine to sertraline. (Patient is encouraged to monitor her BP at home and see her PCP as  needed). Discussed risk of serotonin syndrome, which includes diarrhea, tremors and confusion. Will continue to have trazodone prn for insomnia and valium prn for anxiety. Discussed risks of somnolence and respiratory suppression, especially with concomitant use of opioids.  # ADHD She was diagnosed ADHD [facility] [place]. Currently prescribed on Adderall by her PCP. Discussed the risk of hypertension, worsening anxiety and decreased appetite.   Plan 1. Decrease duloxetine 60 mg for two weeks, then discontinue 2. Start sertraline 50 mg daily for one week, then 100 mg daily for one week, then 150 mg daily 3. Continue diazepam 2 mg daily as needed for anxiety 4. Return to clinic in one month   The patient demonstrates the following risk factors for suicide: Chronic risk factors for suicide include: psychiatric disorder of depression and chronic pain. Acute risk factors for suicide include: loss (financial, interpersonal, professional). Protective factors for this patient include: responsibility to others (children, family), coping skills and hope for the future. Considering these factors, the overall suicide risk at this point appears to be low. Patient is appropriate for outpatient follow up.   Treatment Plan Summary: Plan as above    [name] Dannielle Baskins, MD 05/23/2016, 2:07 PM

## 2016-05-23 ENCOUNTER — Ambulatory Visit (INDEPENDENT_AMBULATORY_CARE_PROVIDER_SITE_OTHER): Payer: BLUE CROSS/BLUE SHIELD | Admitting: Psychiatry

## 2016-05-23 ENCOUNTER — Encounter (HOSPITAL_COMMUNITY): Payer: Self-pay | Admitting: Psychiatry

## 2016-05-23 VITALS — BP 179/106 | HR 76 | Ht 66.0 in | Wt 268.6 lb

## 2016-05-23 DIAGNOSIS — Z79899 Other long term (current) drug therapy: Secondary | ICD-10-CM | POA: Diagnosis not present

## 2016-05-23 DIAGNOSIS — Z79891 Long term (current) use of opiate analgesic: Secondary | ICD-10-CM | POA: Diagnosis not present

## 2016-05-23 DIAGNOSIS — Z818 Family history of other mental and behavioral disorders: Secondary | ICD-10-CM

## 2016-05-23 DIAGNOSIS — F331 Major depressive disorder, recurrent, moderate: Secondary | ICD-10-CM | POA: Diagnosis not present

## 2016-05-23 DIAGNOSIS — F17210 Nicotine dependence, cigarettes, uncomplicated: Secondary | ICD-10-CM | POA: Diagnosis not present

## 2016-05-23 DIAGNOSIS — Z888 Allergy status to other drugs, medicaments and biological substances status: Secondary | ICD-10-CM

## 2016-05-23 MED ORDER — SERTRALINE HCL 50 MG PO TABS: ORAL_TABLET | ORAL | 1 refills | Status: DC

## 2016-05-23 NOTE — Patient Instructions (Addendum)
1. Decrease duloxetine 60 mg for two weeks, then discontinue 2. Start sertraline 50 mg daily for one week, then 100 mg daily for one week, then 150 mg daily 3. Continue diazepam 2 mg daily as needed for anxiety 4. Return to clinic in one month

## 2016-05-27 ENCOUNTER — Ambulatory Visit: Payer: BLUE CROSS/BLUE SHIELD | Admitting: Family Medicine

## 2016-05-27 DIAGNOSIS — K219 Gastro-esophageal reflux disease without esophagitis: Secondary | ICD-10-CM | POA: Insufficient documentation

## 2016-05-27 DIAGNOSIS — M797 Fibromyalgia: Secondary | ICD-10-CM | POA: Insufficient documentation

## 2016-05-27 DIAGNOSIS — F1190 Opioid use, unspecified, uncomplicated: Secondary | ICD-10-CM | POA: Insufficient documentation

## 2016-06-06 ENCOUNTER — Ambulatory Visit (INDEPENDENT_AMBULATORY_CARE_PROVIDER_SITE_OTHER): Payer: BLUE CROSS/BLUE SHIELD | Admitting: Family Medicine

## 2016-06-06 ENCOUNTER — Encounter: Payer: Self-pay | Admitting: Family Medicine

## 2016-06-06 VITALS — BP 128/78 | Ht 66.5 in | Wt 268.6 lb

## 2016-06-06 DIAGNOSIS — F988 Other specified behavioral and emotional disorders with onset usually occurring in childhood and adolescence: Secondary | ICD-10-CM

## 2016-06-06 MED ORDER — AMPHETAMINE-DEXTROAMPHET ER 30 MG PO CP24: ORAL_CAPSULE | ORAL | 0 refills | Status: DC

## 2016-06-06 NOTE — Progress Notes (Signed)
   Subjective:    Patient ID: Lauren Greene, female    DOB: 10/14/1965, 51 y.o.   MRN: [id number]  HPI  Patient arrives for a follow up on ADHD. Patient stated she is currently out of her ADHD medication but was doing well on her medications Patient relates that she recently had surgery had some pain issues with surgery but doing better now she is managed by Dr. Ollen BowlHarkins for her pain She also relates her ADD medicine ran out she is noticed a difference in her focus and ability to follow-through she is requesting refills on her medicine. Review of Systems  Constitutional: Negative for activity change, appetite change and fatigue.  Gastrointestinal: Negative for abdominal pain.  Neurological: Negative for headaches.  Psychiatric/Behavioral: Negative for behavioral problems.       Objective:   Physical Exam  Constitutional: She appears well-developed and well-nourished.  HENT:  Head: Normocephalic.  Cardiovascular: Normal rate, regular rhythm and normal heart sounds.   No murmur heard. Pulmonary/Chest: Effort normal and breath sounds normal.  Neurological: She is alert.  Skin: Skin is warm and dry.  Psychiatric: She has a normal mood and affect.  Vitals reviewed.    In this particular patient the medication does help her with her focus and the benefits of the medicine outweigh any negatives     Assessment & Plan:  This patient has adult ADD. Takes medication responsibly. Medication does help the patient focus in be more functional. Patient relates that they are not abusing the medication or misusing the medication. The patient understands that if they're having any negative side effects such as elevated high blood pressure severe headaches palpitations they would need stop the medication follow-up immediately. They also understand that the prescriptions are to last for 3 months then the patient will need to follow-up before having further prescriptions. 3 prescriptions were given  she is to follow-up in 3 months. Blood pressure good.

## 2016-06-18 NOTE — Progress Notes (Signed)
BH MD/PA/NP OP Progress Note  06/20/2016 2:03 PM CEANNA WAREING  MRN:  [id number]  Chief Complaint:  Chief Complaint    Depression; Follow-up     Subjective:  "I am emotional" HPI:  Patient presents for follow-up appointment. She states that she is more emotional since switching to sertraline. She feels irritable and does not have good motivation and tends to stay in the house. Although she usually make up her that in the morning, she has been doing it around noon. She states that she realizes that she had been on medication for incontinence, which could have raised her blood pressure. She underwent hysterectomy due to uterine prolapse and her hypertension resolved after worse. She would like to be back on duloxetine. She denies SI. She takes Valium only as needed. She endorses insomnia and takes trazodone 200 at night.   Visit Diagnosis:    ICD-9-CM ICD-10-CM   1. Moderate episode of recurrent major depressive disorder (HCC) 296.32 F33.1     Past Psychiatric History:  Outpatient: She was evaluated for ADD in [place] in 2014; started on Adderall since then. Psychiatry admission: denies  Previous suicide attempt: denies Past trials of medication: Celexa, Wellbutrin ("mean"), Prozac, Effexor, Mirtazapine, duloxetine, Adderall (4 years), gabapentin, Lyrica (swelling), lorazepam, valium  Past Medical History:  Past Medical History:  Diagnosis Date  . ADHD (attention deficit hyperactivity disorder)   . Arthritis   . Back pain   . Broken femur (HCC) 08/27/12  . Migraine   . Osteoarthritis   . Thyroid disease    hypothyroidism    Past Surgical History:  Procedure Laterality Date  . BACK SURGERY    . CERVICAL BIOPSY  W/ LOOP ELECTRODE EXCISION  1990's   in [place], [place]  . ENDOMETRIAL BIOPSY  04-05-05 and 02-12-06   04-05-05 benign but too rapid growth. RXd Provera and repeat Bx 87mo. 02-12-06 Bx benign--Dr. Leda Quail  . FEMUR IM NAIL Left 08/27/2012   Procedure:  INTRAMEDULLARY (IM) RETROGRADE FEMORAL NAILING;  Surgeon: Kathryne Hitch, MD;  Location: [facility];  Service: Orthopedics;  Laterality: Left;  . HEEL SPUR SURGERY    . IM NAILING FEMORAL SHAFT FRACTURE Left 08/27/2012   Dr Magnus Ivan  . TOTAL KNEE ARTHROPLASTY     x2  . TUBAL LIGATION      Family Psychiatric History:  Mother- bipolar, sister - bipolar,   Family History:  Family History  Problem Relation Age of Onset  . Cancer Mother   . Breast cancer Mother   . Heart failure Father   . Cancer Father   . Cancer Other   . Heart failure Other     Social History:  Social History   Social History  . Marital status: Single    Spouse name: N/A  . Number of children: N/A  . Years of education: N/A   Social History Main Topics  . Smoking status: Former Smoker    Years: 10.00    Types: Cigarettes    Quit date: 03/18/2004  . Smokeless tobacco: Never Used  . Alcohol use 0.0 oz/week     Comment: rarely  . Drug use: No  . Sexual activity: Not Currently    Partners: Female    Birth control/ protection:    Other Topics Concern  . None   Social History Narrative  . None   Additional Social History:  Education: graduated from [employer], Struggled during childhood  Work: Pharmacologist as needed,  Lives by herself, she has  three children and one grandchild   Allergies:  Allergies  Allergen Reactions  . Wellbutrin [Bupropion]     Anger issues  . Lyrica [Pregabalin] Swelling    Leg swelling    Metabolic Disorder Labs: Lab Results  Component Value Date   HGBA1C 5.6 07/22/2014   MPG 103 02/16/2013   MPG 111 07/24/2010   No results found for: PROLACTIN Lab Results  Component Value Date   CHOL 181 01/12/2016   TRIG 132 01/12/2016   HDL 47 01/12/2016   CHOLHDL 3.9 01/12/2016   VLDL 13 07/10/2012   LDLCALC 108 (H) 01/12/2016   LDLCALC 75 07/06/2015     Current Medications: Current Outpatient Prescriptions  Medication Sig Dispense Refill  .  amphetamine-dextroamphetamine (ADDERALL XR) 30 MG 24 hr capsule 2 qam 60 capsule 0  . clobetasol ointment (TEMOVATE) 0.05 % Apply 1 application topically 2 (two) times daily. Apply as directed twice daily 60 g 1  . diazepam (VALIUM) 2 MG tablet Take 1 tablet (2 mg total) by mouth daily as needed for anxiety. 30 tablet 1  . Diclofenac Sodium CR 100 MG 24 hr tablet TAKE 1 TABLET BY MOUTH TWICE DAILY. TAKE WITH FOOD. 60 tablet 5  . Diphenhyd-Hydrocort-Nystatin (FIRST-DUKES MOUTHWASH) SUSP Take one tsp swish and spit qid 1 Bottle 0  . DULoxetine 40 MG CPEP Start 40 mg daily for 4 days, then 80 mg daily for 4 days, then 120 mg daily 90 capsule 0  . lidocaine (XYLOCAINE) 2 % solution Use bid prn 120 mL 0  . methocarbamol (ROBAXIN) 750 MG tablet Take 750 mg by mouth 2 (two) times daily as needed for muscle spasms.    Marland Kitchen MOVANTIK 25 MG TABS tablet Take 1 tablet by mouth daily.  11  . nystatin (MYCOSTATIN/NYSTOP) powder Apply topically 2 (two) times daily. 45 g 0  . omeprazole (PRILOSEC) 40 MG capsule Take 1 capsule (40 mg total) by mouth daily. 30 capsule 11  . ondansetron (ZOFRAN ODT) 8 MG disintegrating tablet Take 1 tablet (8 mg total) by mouth every 8 (eight) hours as needed for nausea or vomiting. 20 tablet 2  . oxyCODONE-acetaminophen (PERCOCET) 10-325 MG tablet One q 4 hours prn Max 5 per day 150 tablet 0  . OXYCONTIN 15 MG 12 hr tablet Take 1 tablet by mouth 2 (two) times daily.  0  . tolterodine (DETROL LA) 4 MG 24 hr capsule Take 4 mg by mouth daily.    . traZODone (DESYREL) 100 MG tablet Take 2 tablets (200 mg total) by mouth at bedtime. 60 tablet 1  . triamcinolone (KENALOG) 0.1 % paste APPLY TO THE AFFECTED AREA ON THE TONGUE TWICE DAILY AS NEEDED. 5 g 0   No current facility-administered medications for this visit.     Neurologic: Headache: No Seizure: No Paresthesias: No  Musculoskeletal: Strength & Muscle Tone: within normal limits Gait & Station: walks with a cane Patient leans:  N/A  Psychiatric Specialty Exam: Review of Systems  Musculoskeletal: Positive for back pain.  Neurological: Positive for tingling.  Psychiatric/Behavioral: Positive for depression. Negative for hallucinations, substance abuse and suicidal ideas. The patient is nervous/anxious and has insomnia.   All other systems reviewed and are negative.   Blood pressure 118/82, pulse 74, height 5' 6.5" (1.689 m), weight 267 lb (121.1 kg), last menstrual period 03/18/2004, SpO2 97 %.Body mass index is 42.45 kg/m.  General Appearance: Casual  Eye Contact:  Good  Speech:  Clear and Coherent  Volume:  Normal  Mood:  "  emotional"  Affect:  Tearful and down  Thought Process:  Coherent and Goal Directed  Orientation:  Full (Time, Place, and Person)  Thought Content: Logical  Perceptions: denies AH/VH  Suicidal Thoughts:  No  Homicidal Thoughts:  No  Memory:  Immediate;   Good Recent;   Good Remote;   Good  Judgement:  Good  Insight:  Fair  Psychomotor Activity:  Normal  Concentration:  Concentration: Good and Attention Span: Good  Recall:  Good  Fund of Knowledge: Good  Language: Good  Akathisia:  No  Handed:  Right  AIMS (if indicated):  N/A  Assets:  Communication Skills Desire for Improvement  ADL's:  Intact  Cognition: WNL  Sleep:  poor   Assessment JUDEE HENNICK is a 51 year old female with depression, ADHD per chart, anxiety, hyperhydrosis, chronic pain who presents for follow up appointment for depression. Patient is demoralized by her chronic pain with limited physical activity.   # MDD There has been worsening in her neurovegetative symptoms since cross tapering from duloxetine to sertraline given concern for hypertension. She asks to be back on duloxetine given her hypertension resolved after she discontinued medication for her incontinence. Will cross switch to duloxetine again. Discussed risk of serotonin syndrome, which includes diarrhea, tremors and confusion. Will continue  to have trazodone prn for insomnia and valium prn for anxiety. Discussed risks of somnolence and respiratory suppression, especially with concomitant use of opioids.  # ADHD She was diagnosed ADHD after evaluation in [facility]. She has been prescribed on Adderall by her PCP. Discussed the risk of hypertension, worsening anxiety and decreased appetite.   Plan 1. Decrease sertraline 100 mg daily for 4 days, then 50 mg daily for 4 days then discontinue 2. Start duloxetine 40 mg daily for 4 days, then 80 mg daily for 4 days, then 120 mg daily  3. Return to clinic in one month   The patient demonstrates the following risk factors for suicide: Chronic risk factors for suicide include: psychiatric disorder of depression and chronic pain. Acute risk factors for suicide include: loss (financial, interpersonal, professional). Protective factors for this patient include: responsibility to others (children, family), coping skills and hope for the future. Considering these factors, the overall suicide risk at this point appears to be low. Patient is appropriate for outpatient follow up.  Treatment Plan Summary: Plan as above  Neysa Hotter, MD 06/20/2016, 2:03 PM

## 2016-06-20 ENCOUNTER — Encounter (HOSPITAL_COMMUNITY): Payer: Self-pay | Admitting: Psychiatry

## 2016-06-20 ENCOUNTER — Ambulatory Visit (INDEPENDENT_AMBULATORY_CARE_PROVIDER_SITE_OTHER): Payer: BLUE CROSS/BLUE SHIELD | Admitting: Psychiatry

## 2016-06-20 VITALS — BP 118/82 | HR 74 | Ht 66.5 in | Wt 267.0 lb

## 2016-06-20 DIAGNOSIS — G8929 Other chronic pain: Secondary | ICD-10-CM

## 2016-06-20 DIAGNOSIS — F419 Anxiety disorder, unspecified: Secondary | ICD-10-CM | POA: Diagnosis not present

## 2016-06-20 DIAGNOSIS — Z79891 Long term (current) use of opiate analgesic: Secondary | ICD-10-CM | POA: Diagnosis not present

## 2016-06-20 DIAGNOSIS — F909 Attention-deficit hyperactivity disorder, unspecified type: Secondary | ICD-10-CM | POA: Diagnosis not present

## 2016-06-20 DIAGNOSIS — Z818 Family history of other mental and behavioral disorders: Secondary | ICD-10-CM

## 2016-06-20 DIAGNOSIS — F331 Major depressive disorder, recurrent, moderate: Secondary | ICD-10-CM | POA: Diagnosis not present

## 2016-06-20 DIAGNOSIS — R61 Generalized hyperhidrosis: Secondary | ICD-10-CM | POA: Diagnosis not present

## 2016-06-20 DIAGNOSIS — Z79899 Other long term (current) drug therapy: Secondary | ICD-10-CM | POA: Diagnosis not present

## 2016-06-20 DIAGNOSIS — Z87891 Personal history of nicotine dependence: Secondary | ICD-10-CM

## 2016-06-20 MED ORDER — DULOXETINE HCL 40 MG PO CPEP: ORAL_CAPSULE | ORAL | 0 refills | Status: DC

## 2016-06-20 MED ORDER — TRAZODONE HCL 100 MG PO TABS: 200.0000 mg | ORAL_TABLET | Freq: Every day | ORAL | 1 refills | Status: DC

## 2016-06-20 NOTE — Patient Instructions (Addendum)
1. Decrease sertraline 100 mg daily for 4 days, then 50 mg daily for 4 days then discontinue 2. Start duloxetine 40 mg daily for 4 days, then 80 mg daily for 4 days, then 120 mg daily  3. Return to clinic in one month

## 2016-06-21 ENCOUNTER — Other Ambulatory Visit: Payer: Self-pay | Admitting: Family Medicine

## 2016-07-09 ENCOUNTER — Telehealth (HOSPITAL_COMMUNITY): Payer: Self-pay | Admitting: *Deleted

## 2016-07-09 NOTE — Telephone Encounter (Signed)
left voice message regarding provider out of office 07/18/16.

## 2016-07-09 NOTE — Telephone Encounter (Signed)
left voice message, provider out of office 07/18/16.

## 2016-07-10 ENCOUNTER — Telehealth: Payer: Self-pay

## 2016-07-10 NOTE — Telephone Encounter (Signed)
Please either nurses or staff find out what other meds are covered for ADD specifically long acting ADD meds ( I dont want to do PA in this situation)

## 2016-07-10 NOTE — Telephone Encounter (Signed)
Adderall XR  er capsules has been rejected by insurance.  Non-preferred agents typically have a higher patient co-pay than health insurance plan preferred agents.  Please research and evaluate therapy options for this patient. You can learn more about alternative medications by calling [employer] [place] [employer] central at [phone], or checking their online formulary.  Do you want to prescribe a preferred drug or proceed with PA?

## 2016-07-11 NOTE — Telephone Encounter (Signed)
Discussed with pt. Pt states she will call insurance and find out which long acting ADD meds are approved. Pt states she will call today and let us know

## 2016-07-18 ENCOUNTER — Ambulatory Visit (HOSPITAL_COMMUNITY): Payer: Self-pay | Admitting: Psychiatry

## 2016-07-26 ENCOUNTER — Ambulatory Visit (INDEPENDENT_AMBULATORY_CARE_PROVIDER_SITE_OTHER): Payer: BLUE CROSS/BLUE SHIELD | Admitting: Nurse Practitioner

## 2016-07-26 VITALS — BP 148/88 | Ht 66.5 in | Wt 262.0 lb

## 2016-07-26 DIAGNOSIS — S70311A Abrasion, right thigh, initial encounter: Secondary | ICD-10-CM | POA: Diagnosis not present

## 2016-07-26 DIAGNOSIS — S7011XA Contusion of right thigh, initial encounter: Secondary | ICD-10-CM

## 2016-07-26 DIAGNOSIS — L03113 Cellulitis of right upper limb: Secondary | ICD-10-CM | POA: Diagnosis not present

## 2016-07-26 MED ORDER — CLINDAMYCIN HCL 300 MG PO CAPS: 300.0000 mg | ORAL_CAPSULE | Freq: Three times a day (TID) | ORAL | 0 refills | Status: DC

## 2016-07-27 ENCOUNTER — Encounter: Payer: Self-pay | Admitting: Nurse Practitioner

## 2016-07-27 NOTE — Progress Notes (Signed)
Subjective:  Presents for c/o injury to right upper thigh that occurred about 2 weeks ago as she was getting off a riding lawnmower. States the rubber gear grabbed her thigh and pulled the tissue hard. Has a picture with her of significant generalized ecchymoses after injury which has improved. Continue to have swelling at site. Has also developed an open area in the center of the wound and over the past few days some slight redness. Area is slightly tender. No fever. Has kept area clean and dry.   Objective:   BP (!) 148/88   Ht 5' 6.5" (1.689 m)   Wt 262 lb (118.8 kg)   LMP 03/18/2004   BMI 41.65 kg/m  NAD. Alert, oriented. Raised localized area of edema approx 16 x 11 cm upper inner right thigh. Mostly fluctuant with some firm areas. Minimal ecchymoses. Open superficial abrasion in the center approx. 4x6 cm. Mild scabbing around upper periphery. Mild erythema just around open wound. Entire area mildly tender. No edema of lower extremity. Dt 08/03/13.  Assessment:  Abrasion, right thigh, initial encounter  Contusion of right thigh, initial encounter  Cellulitis of right upper extremity  Probable resolving hematoma  Plan:   Meds ordered this encounter  Medications  . clindamycin (CLEOCIN) 300 MG capsule    Sig: Take 1 capsule (300 mg total) by mouth 3 (three) times daily.    Dispense:  30 capsule    Refill:  0    Order Specific Question:   Supervising Provider    Answer:   Merlyn AlbertLUKING, WILLIAM S [[id number]   Reviewed wound care and warning signs. Recheck next week, call sooner if worse.

## 2016-08-02 ENCOUNTER — Ambulatory Visit (INDEPENDENT_AMBULATORY_CARE_PROVIDER_SITE_OTHER): Payer: BLUE CROSS/BLUE SHIELD | Admitting: Nurse Practitioner

## 2016-08-02 ENCOUNTER — Encounter: Payer: Self-pay | Admitting: Nurse Practitioner

## 2016-08-02 VITALS — BP 130/86 | Ht 65.0 in | Wt 264.0 lb

## 2016-08-02 DIAGNOSIS — S7011XA Contusion of right thigh, initial encounter: Secondary | ICD-10-CM | POA: Diagnosis not present

## 2016-08-02 DIAGNOSIS — S70311A Abrasion, right thigh, initial encounter: Secondary | ICD-10-CM | POA: Diagnosis not present

## 2016-08-04 ENCOUNTER — Encounter: Payer: Self-pay | Admitting: Nurse Practitioner

## 2016-08-04 NOTE — Progress Notes (Signed)
Subjective:  Presents for recheck of wound and hematoma on right thigh. Still tender but improved. No fever. Minimal drainage.   Objective:   BP 130/86   Ht 5\' 5"  (1.651 m)   Wt 264 lb (119.7 kg)   LMP 03/18/2004   BMI 43.93 kg/m  Continues to have a large swollen area right upper inner thigh; minimal change from previous visit. Less erythema noted. Open wound with about 1/2 of the wound having eschar tissue. Visible part of the wound does not appear infected with healthy tissue.   Assessment:  Abrasion, right thigh, initial encounter - Plan: Ambulatory referral to Pain Clinic  Contusion of right thigh, initial encounter - Plan: Ambulatory referral to Pain Clinic    Plan:  Refer to wound clinic for evaluation and treatment. Warning signs reviewed. Call back sooner if worse.

## 2016-08-05 ENCOUNTER — Encounter: Payer: Self-pay | Admitting: Family Medicine

## 2016-08-07 ENCOUNTER — Other Ambulatory Visit: Payer: Self-pay | Admitting: Family Medicine

## 2016-08-08 NOTE — Telephone Encounter (Signed)
Last seen 06/06/16

## 2016-08-19 ENCOUNTER — Ambulatory Visit (INDEPENDENT_AMBULATORY_CARE_PROVIDER_SITE_OTHER): Payer: BLUE CROSS/BLUE SHIELD | Admitting: Psychiatry

## 2016-08-19 VITALS — BP 110/70 | HR 74 | Ht 66.0 in | Wt 264.0 lb

## 2016-08-19 DIAGNOSIS — R61 Generalized hyperhidrosis: Secondary | ICD-10-CM | POA: Diagnosis not present

## 2016-08-19 DIAGNOSIS — Z79899 Other long term (current) drug therapy: Secondary | ICD-10-CM

## 2016-08-19 DIAGNOSIS — F909 Attention-deficit hyperactivity disorder, unspecified type: Secondary | ICD-10-CM

## 2016-08-19 DIAGNOSIS — Z818 Family history of other mental and behavioral disorders: Secondary | ICD-10-CM | POA: Diagnosis not present

## 2016-08-19 DIAGNOSIS — Z79891 Long term (current) use of opiate analgesic: Secondary | ICD-10-CM | POA: Diagnosis not present

## 2016-08-19 DIAGNOSIS — F329 Major depressive disorder, single episode, unspecified: Secondary | ICD-10-CM

## 2016-08-19 DIAGNOSIS — G8929 Other chronic pain: Secondary | ICD-10-CM | POA: Diagnosis not present

## 2016-08-19 DIAGNOSIS — Z888 Allergy status to other drugs, medicaments and biological substances status: Secondary | ICD-10-CM

## 2016-08-19 DIAGNOSIS — Z87891 Personal history of nicotine dependence: Secondary | ICD-10-CM | POA: Diagnosis not present

## 2016-08-19 DIAGNOSIS — F331 Major depressive disorder, recurrent, moderate: Secondary | ICD-10-CM

## 2016-08-19 MED ORDER — TRAZODONE HCL 100 MG PO TABS: 200.0000 mg | ORAL_TABLET | Freq: Every day | ORAL | 1 refills | Status: DC

## 2016-08-19 MED ORDER — NORTRIPTYLINE HCL 25 MG PO CAPS: 25.0000 mg | ORAL_CAPSULE | Freq: Every day | ORAL | 1 refills | Status: DC

## 2016-08-19 MED ORDER — DULOXETINE HCL 60 MG PO CPEP: 120.0000 mg | ORAL_CAPSULE | Freq: Every day | ORAL | 1 refills | Status: DC

## 2016-08-19 NOTE — Patient Instructions (Addendum)
1. Continue duloxetine 120 mg daily  2. Start nortriptyline 25 mg at night 3. Return to clinic in one month 4. Referral to therapist (Ms. Peggy)

## 2016-08-19 NOTE — Progress Notes (Signed)
[facility] MD/PA/NP OP Progress Note  08/19/2016 12:03 PM Lauren Greene  MRN:  [id number]  Chief Complaint:  Chief Complaint    Follow-up; Depression     Subjective:  "I am not the same as I used to be" HPI:  Patient presents for follow-up appointment. She states that she is relatively doing well after switching back to duloxetine. She is looking forward to her birthday party, when her sister visit her. She will also have a dinner with her children. She tripped and had a fall the other day and had bruises on her face (she was evaluated by a provider). She feels frustrated about her pain, as she is not able to do things as she used to, such as gardening she enjoys. She also feels hurt by the way people see her when she asks for pain medication. 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 She denies insomnia. She denies anhedonia. She has low motivation to do things, although she is able to make herself do things such as house chores. She denies SI, HI, AH/VH. She denies anxiety.   Visit Diagnosis:     ICD-9-CM ICD-10-CM   1. Moderate episode of recurrent major depressive disorder (HCC) 296.32 F33.1     Past Psychiatric History:  Outpatient: She was evaluated for ADD in [place] in 2014; started on Adderall since then. Psychiatry admission: denies  Previous suicide attempt: denies Past trials of medication: Celexa, Wellbutrin ("mean"), Prozac, Effexor, Mirtazapine, duloxetine, Adderall (4 years), gabapentin, Lyrica (swelling), lorazepam, valium  Past Medical History:  Past Medical History:  Diagnosis Date  . ADHD (attention deficit hyperactivity disorder)   . Arthritis   . Back pain   . Broken femur (HCC) 08/27/12  . Migraine   . Osteoarthritis   . Thyroid disease    hypothyroidism    Past Surgical History:  Procedure Laterality Date  . BACK SURGERY    . CERVICAL BIOPSY  W/ LOOP ELECTRODE EXCISION  1990's    in [place], [place]  . ENDOMETRIAL BIOPSY  04-05-05 and 02-12-06   04-05-05 benign but too rapid growth. RXd Provera and repeat Bx 6mo. 02-12-06 Bx benign--Dr. Leda Quail  . FEMUR IM NAIL Left 08/27/2012   Procedure: INTRAMEDULLARY (IM) RETROGRADE FEMORAL NAILING;  Surgeon: Kathryne Hitch, MD;  Location: [facility] OR;  Service: Orthopedics;  Laterality: Left;  . HEEL SPUR SURGERY    . IM NAILING FEMORAL SHAFT FRACTURE Left 08/27/2012   Dr Magnus Ivan  . TOTAL KNEE ARTHROPLASTY     x2  . TUBAL LIGATION      Family Psychiatric History:  Mother- bipolar, sister - bipolar,   Family History:  Family History  Problem Relation Age of Onset  . Cancer Mother   . Breast cancer Mother   . Heart failure Father   . Cancer Father   . Cancer Other   . Heart failure Other     Social History:  Social History   Social History  . Marital status: Single    Spouse name: N/A  . Number of children: N/A  . Years of education: N/A   Social History Main Topics  . Smoking status: Former Smoker    Years: 10.00    Types: Cigarettes    Quit date: 03/18/2004  . Smokeless tobacco: Never Used  . Alcohol use 0.0 oz/week     Comment: rarely  . Drug use: No  .  Sexual activity: Not Currently    Partners: Female    Birth control/ protection:    Other Topics Concern  . Not on file   Social History Narrative  . 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 Wellbutrin [Bupropion]     Anger issues  . Lyrica [Pregabalin] Swelling    Leg swelling    Metabolic Disorder Labs: Lab Results  Component Value Date   HGBA1C 5.6 07/22/2014   MPG 103 02/16/2013   MPG 111 07/24/2010   No results found for: PROLACTIN Lab Results  Component Value Date   CHOL 181 01/12/2016   TRIG 132  01/12/2016   HDL 47 01/12/2016   CHOLHDL 3.9 01/12/2016   VLDL 13 07/10/2012   LDLCALC 108 (H) 01/12/2016   LDLCALC 75 07/06/2015     Current Medications: Current Outpatient Prescriptions  Medication Sig Dispense Refill  . amphetamine-dextroamphetamine (ADDERALL XR) 30 MG 24 hr capsule 2 qam 60 capsule 0  . clobetasol ointment (TEMOVATE) 0.05 % Apply 1 application topically 2 (two) times daily. Apply as directed twice daily 60 g 1  . diazepam (VALIUM) 2 MG tablet Take 1 tablet (2 mg total) by mouth daily as needed for anxiety. 30 tablet 1  . Diphenhyd-Hydrocort-Nystatin (FIRST-DUKES MOUTHWASH) SUSP Take one tsp swish and spit qid 1 Bottle 0  . lidocaine (XYLOCAINE) 2 % solution TAKE 5 TO 10 MLS BY MOUTH TWICE DAILY AS NEEDED. 100 mL 0  . methocarbamol (ROBAXIN) 750 MG tablet Take 750 mg by mouth 2 (two) times daily as needed for muscle spasms.    Marland Kitchen nystatin (MYCOSTATIN/NYSTOP) powder APPLY TO AFFECTED AREA(S) TOPICALLY TWICE DAILY. 45 g 0  . omeprazole (PRILOSEC) 40 MG capsule Take 1 capsule (40 mg total) by mouth daily. 30 capsule 11  . ondansetron (ZOFRAN ODT) 8 MG disintegrating tablet Take 1 tablet (8 mg total) by mouth every 8 (eight) hours as needed for nausea or vomiting. 20 tablet 2  . oxyCODONE-acetaminophen (PERCOCET) 10-325 MG tablet One q 4 hours prn Max 5 per day 150 tablet 0  . OXYCONTIN 15 MG 12 hr tablet Take 1 tablet by mouth 2 (two) times daily.  0  . traZODone (DESYREL) 100 MG tablet Take 2 tablets (200 mg total) by mouth at bedtime. 60 tablet 1  . triamcinolone (KENALOG) 0.1 % paste APPLY TO THE AFFECTED AREA ON THE TONGUE TWICE DAILY AS NEEDED. 5 g 0  . clindamycin (CLEOCIN) 300 MG capsule Take 1 capsule (300 mg total) by mouth 3 (three) times daily. (Patient not taking: Reported on 08/19/2016) 30 capsule 0  . Diclofenac Sodium CR 100 MG 24 hr tablet TAKE 1 TABLET BY MOUTH TWICE DAILY. TAKE WITH FOOD. (Patient not taking: Reported on 08/19/2016) 60 tablet 0  . DULoxetine  (CYMBALTA) 60 MG capsule Take 2 capsules (120 mg total) by mouth daily. 60 capsule 1  . MOVANTIK 25 MG TABS tablet Take 1 tablet by mouth daily.  11  . nortriptyline (PAMELOR) 25 MG capsule Take 1 capsule (25 mg total) by mouth at bedtime. 30 capsule 1   No current facility-administered medications for this visit.     Neurologic: Headache: No Seizure: No Paresthesias: No  Musculoskeletal: Strength & Muscle Tone: within normal limits Gait & Station: walks with a cane  Patient leans: N/A  Psychiatric Specialty Exam: Review of Systems  Musculoskeletal: Positive for back pain and joint pain.  Neurological: Positive for tingling.  Psychiatric/Behavioral: Positive for depression. Negative for hallucinations, substance abuse and suicidal ideas. The patient is not nervous/anxious and does not have insomnia.   All other systems reviewed and are negative.   Blood pressure 110/70, pulse 74, height 5\' 6"  (1.676 m), weight 264 lb (119.7 kg), last menstrual period 03/18/2004, SpO2 93 %.Body mass index is 42.61 kg/m.  General Appearance: Casual  Eye Contact:  Good  Speech:  Clear and Coherent  Volume:  Normal  Mood:  "better"  Affect:  Tearful and down  Thought Process:  Coherent and Goal Directed  Orientation:  Full (Time, Place, and Person)  Thought Content: Logical  Perceptions: denies AH/VH  Suicidal Thoughts:  No  Homicidal Thoughts:  No  Memory:  Immediate;   Good Recent;   Good Remote;   Good  Judgement:  Good  Insight:  Fair  Psychomotor Activity:  Normal  Concentration:  Concentration: Good and Attention Span: Good  Recall:  Good  Fund of Knowledge: Good  Language: Good  Akathisia:  No  Handed:  Right  AIMS (if indicated):  N/A  Assets:  Communication Skills Desire for Improvement  ADL's:  Intact  Cognition: WNL  Sleep:  good   Assessment Lauren Greene is a 51 year old female with depression, ADHD per chart, anxiety, hyperhydrosis, chronic pain who presents for  follow up appointment for depression.   Patient is demoralized by her chronic pain with limited physical activity.   # MDD Although she responded very well to uptitration of duloxetine, she continues to endorse low motivation and she is demoralized by her chronic pain and limited physical activity. Will continue duloxetine to target her depression, pain. Will also add nortriptyline to target her mood, pain. Discussed side effect of dry mouth, constipation and risk of serotonin syndrome. Patient is advised to discontinue this medication if she were to develop symptoms, which includes diarrhea, tremors and confusion. Will continue trazodone for insomnia and valium prn for anxiety. Discussed risks of somnolence and respiratory suppression, especially with concomitant use of opioids. She will greatly benefit from supportive therapy given her demoralization; will make a referral.   # ADHD [facility] [place] She has been prescribed on Adderall by her PCP. Discussed the risk of hypertension, worsening anxiety and decreased appetite.   Plan 1. Continue duloxetine 120 mg daily  2. Start nortriptyline 25 mg at night 3. Return to clinic in one month for 30 mins 4. Referral to therapist (Ms. Peggy) (Patient is on Adderall XR 30 mg daily and valium 2 mg prn for anxiety prescribed by her PCP)   The patient demonstrates the following risk factors for suicide: Chronic risk factors for suicide include: psychiatric disorder of depression and chronic pain. Acute risk factors for suicide include: loss (financial, interpersonal, professional). Protective factors for this patient include: responsibility to others (children, family), coping skills and hope for the future. Considering these factors, the overall suicide risk at this point appears to be low. Patient is appropriate for outpatient follow up.  Treatment Plan Summary: Plan as above  The duration of this appointment  visit was 30 minutes of face-to-face time with the patient.  Greater than 50% of this time was spent in counseling, explanation of  diagnosis, planning of further management, and coordination of care.  [name] Kohana Amble, MD 08/19/2016, 12:03 PM

## 2016-09-03 ENCOUNTER — Ambulatory Visit (INDEPENDENT_AMBULATORY_CARE_PROVIDER_SITE_OTHER): Payer: BLUE CROSS/BLUE SHIELD | Admitting: Psychiatry

## 2016-09-03 ENCOUNTER — Encounter (HOSPITAL_COMMUNITY): Payer: Self-pay | Admitting: Psychiatry

## 2016-09-03 DIAGNOSIS — F331 Major depressive disorder, recurrent, moderate: Secondary | ICD-10-CM | POA: Diagnosis not present
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
## 2016-09-05 ENCOUNTER — Ambulatory Visit (INDEPENDENT_AMBULATORY_CARE_PROVIDER_SITE_OTHER): Payer: BLUE CROSS/BLUE SHIELD | Admitting: Family Medicine

## 2016-09-05 ENCOUNTER — Encounter: Payer: Self-pay | Admitting: Family Medicine

## 2016-09-05 VITALS — BP 130/82 | Ht 64.0 in | Wt 261.8 lb

## 2016-09-05 DIAGNOSIS — F988 Other specified behavioral and emotional disorders with onset usually occurring in childhood and adolescence: Secondary | ICD-10-CM

## 2016-09-05 MED ORDER — AMPHETAMINE-DEXTROAMPHET ER 30 MG PO CP24: ORAL_CAPSULE | ORAL | 0 refills | Status: DC

## 2016-09-05 NOTE — Progress Notes (Signed)
   Subjective:    Patient ID: Lauren Greene, female    DOB: 12/15/1965, 51 y.o.   MRN: [id number]  HPI Patient arrives for a follow up on ADHD. A tall ADD Medication helps her focus She functions better with it Denies abusing it Drug registry check Helps with her ADD.  Review of Systems  Constitutional: Negative for activity change, appetite change and fatigue.  Gastrointestinal: Negative for abdominal pain.  Neurological: Negative for headaches.  Psychiatric/Behavioral: Negative for behavioral problems.       Objective:   Physical Exam  Constitutional: She appears well-developed and well-nourished.  HENT:  Head: Normocephalic.  Cardiovascular: Normal rate, regular rhythm and normal heart sounds.   No murmur heard. Pulmonary/Chest: Effort normal and breath sounds normal.  Neurological: She is alert.  Skin: Skin is warm and dry.  Psychiatric: She has a normal mood and affect.  Vitals reviewed.         Assessment & Plan:  Adult ADD Continue medication 3 scripts given Follow-up in 3 months Follow-up if any other problems.

## 2016-09-11 NOTE — Progress Notes (Deleted)
[facility] MD/PA/NP OP Progress Note  09/11/2016 10:59 AM Lauren Greene  MRN:  [id number]  Chief Complaint:   Subjective:  "I am not the same as I used to be" HPI:  Patient presents for follow-up appointment. She states that she is relatively doing well after switching back to duloxetine. She is looking forward to her birthday party, when her sister visit her. She will also have a dinner with her children. She tripped and had a fall the other day and had bruises on her face (she was evaluated by a provider). She feels frustrated about her pain, as she is not able to do things as she used to, such as gardening she enjoys. She also feels hurt by the way people see her when she asks for pain medication. 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 She denies insomnia. She denies anhedonia. She has low motivation to do things, although she is able to make herself do things such as house chores. She denies SI, HI, AH/VH. She denies anxiety.   Visit Diagnosis:   No diagnosis found.  Past Psychiatric History:  Outpatient: She was evaluated for ADD in [place] in 2014; started on Adderall since then. Psychiatry admission: denies  Previous suicide attempt: denies Past trials of medication: Celexa, Wellbutrin ("mean"), Prozac, Effexor, Mirtazapine, duloxetine, Adderall (4 years), gabapentin, Lyrica (swelling), lorazepam, valium  Past Medical History:  Past Medical History:  Diagnosis Date  . ADHD (attention deficit hyperactivity disorder)   . Arthritis   . Back pain   . Broken femur (HCC) 08/27/12  . Migraine   . Osteoarthritis   . Thyroid disease    hypothyroidism    Past Surgical History:  Procedure Laterality Date  . BACK SURGERY    . CERVICAL BIOPSY  W/ LOOP ELECTRODE EXCISION  1990's   in [place], [place]  . ENDOMETRIAL BIOPSY  04-05-05 and 02-12-06   04-05-05 benign but too rapid growth. RXd Provera and repeat Bx  91mo. 02-12-06 Bx benign--Dr. Leda Quail  . FEMUR IM NAIL Left 08/27/2012   Procedure: INTRAMEDULLARY (IM) RETROGRADE FEMORAL NAILING;  Surgeon: Kathryne Hitch, MD;  Location: [facility] OR;  Service: Orthopedics;  Laterality: Left;  . HEEL SPUR SURGERY    . IM NAILING FEMORAL SHAFT FRACTURE Left 08/27/2012   Dr Magnus Ivan  . TOTAL KNEE ARTHROPLASTY     x2  . TUBAL LIGATION      Family Psychiatric History:  Mother- bipolar, sister - bipolar,   Family History:  Family History  Problem Relation Age of Onset  . Cancer Mother   . Breast cancer Mother   . Bipolar disorder Mother   . Heart failure Father   . Cancer Father   . Cancer Other   . Heart failure Other   . Bipolar disorder Sister     Social History:  Social History   Social History  . Marital status: Single    Spouse name: N/A  . Number of children: N/A  . Years of education: N/A   Social History Main Topics  . Smoking status: Former Smoker    Years: 10.00    Types: Cigarettes    Quit date: 08/16/2000  . Smokeless tobacco: Never Used  . Alcohol use 0.0 oz/week     Comment: rarely  . Drug use: No  . Sexual activity: Not Currently    Partners: Female    Birth control/  protection:    Other Topics Concern  . Not on file   Social History Narrative  . 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 Wellbutrin [Bupropion]     Anger issues  . Lyrica [Pregabalin] Swelling    Leg swelling    Metabolic Disorder Labs: Lab Results  Component Value Date   HGBA1C 5.6 07/22/2014   MPG 103 02/16/2013   MPG 111 07/24/2010   No results found for: PROLACTIN Lab Results  Component Value Date   CHOL 181 01/12/2016   TRIG 132 01/12/2016   HDL 47 01/12/2016   CHOLHDL 3.9 01/12/2016   VLDL 13  07/10/2012   LDLCALC 108 (H) 01/12/2016   LDLCALC 75 07/06/2015     Current Medications: Current Outpatient Prescriptions  Medication Sig Dispense Refill  . amphetamine-dextroamphetamine (ADDERALL XR) 30 MG 24 hr capsule 2 qam 60 capsule 0  . clindamycin (CLEOCIN) 300 MG capsule Take 1 capsule (300 mg total) by mouth 3 (three) times daily. (Patient not taking: Reported on 08/19/2016) 30 capsule 0  . clobetasol ointment (TEMOVATE) 0.05 % Apply 1 application topically 2 (two) times daily. Apply as directed twice daily 60 g 1  . diazepam (VALIUM) 2 MG tablet Take 1 tablet (2 mg total) by mouth daily as needed for anxiety. 30 tablet 1  . Diclofenac Sodium CR 100 MG 24 hr tablet TAKE 1 TABLET BY MOUTH TWICE DAILY. TAKE WITH FOOD. 60 tablet 0  . Diphenhyd-Hydrocort-Nystatin (FIRST-DUKES MOUTHWASH) SUSP Take one tsp swish and spit qid 1 Bottle 0  . DULoxetine (CYMBALTA) 60 MG capsule Take 2 capsules (120 mg total) by mouth daily. 60 capsule 1  . lidocaine (XYLOCAINE) 2 % solution TAKE 5 TO 10 MLS BY MOUTH TWICE DAILY AS NEEDED. 100 mL 0  . methocarbamol (ROBAXIN) 750 MG tablet Take 750 mg by mouth 2 (two) times daily as needed for muscle spasms.    Marland Kitchen. MOVANTIK 25 MG TABS tablet Take 1 tablet by mouth daily.  11  . nortriptyline (PAMELOR) 25 MG capsule Take 1 capsule (25 mg total) by mouth at bedtime. 30 capsule 1  . nystatin (MYCOSTATIN/NYSTOP) powder APPLY TO AFFECTED AREA(S) TOPICALLY TWICE DAILY. 45 g 0  . omeprazole (PRILOSEC) 40 MG capsule Take 1 capsule (40 mg total) by mouth daily. 30 capsule 11  . ondansetron (ZOFRAN ODT) 8 MG disintegrating tablet Take 1 tablet (8 mg total) by mouth every 8 (eight) hours as needed for nausea or vomiting. (Patient not taking: Reported on 09/03/2016) 20 tablet 2  . oxyCODONE-acetaminophen (PERCOCET) 10-325 MG tablet One q 4 hours prn Max 5 per day 150 tablet 0  . OXYCONTIN 15 MG 12 hr tablet Take 1 tablet by mouth 2 (two) times daily.  0  . traZODone (DESYREL)  100 MG tablet Take 2 tablets (200 mg total) by mouth at bedtime. 60 tablet 1  . triamcinolone (KENALOG) 0.1 % paste APPLY TO THE AFFECTED AREA ON THE TONGUE TWICE DAILY AS NEEDED. 5 g 0   No current facility-administered medications for this visit.     Neurologic: Headache: No Seizure: No Paresthesias: No  Musculoskeletal: Strength & Muscle Tone: within normal limits Gait & Station: walks with a cane Patient leans: N/A  Psychiatric Specialty Exam: Review of Systems  Musculoskeletal: Positive for  back pain and joint pain.  Neurological: Positive for tingling.  Psychiatric/Behavioral: Positive for depression. Negative for hallucinations, substance abuse and suicidal ideas. The patient is not nervous/anxious and does not have insomnia.   All other systems reviewed and are negative.   Last menstrual period 03/18/2004.There is no height or weight on file to calculate BMI.  General Appearance: Casual  Eye Contact:  Good  Speech:  Clear and Coherent  Volume:  Normal  Mood:  "better"  Affect:  Tearful and down  Thought Process:  Coherent and Goal Directed  Orientation:  Full (Time, Place, and Person)  Thought Content: Logical  Perceptions: denies AH/VH  Suicidal Thoughts:  No  Homicidal Thoughts:  No  Memory:  Immediate;   Good Recent;   Good Remote;   Good  Judgement:  Good  Insight:  Fair  Psychomotor Activity:  Normal  Concentration:  Concentration: Good and Attention Span: Good  Recall:  Good  Fund of Knowledge: Good  Language: Good  Akathisia:  No  Handed:  Right  AIMS (if indicated):  N/A  Assets:  Communication Skills Desire for Improvement  ADL's:  Intact  Cognition: WNL  Sleep:  good   Assessment Lauren Greene is a 51 year old female with depression, ADHD per chart, anxiety, hyperhydrosis, chronic pain who presents for follow up appointment for depression.   Patient is demoralized by her chronic pain with limited physical activity.   # MDD Although  she responded very well to uptitration of duloxetine, she continues to endorse low motivation and she is demoralized by her chronic pain and limited physical activity. Will continue duloxetine to target her depression, pain. Will also add nortriptyline to target her mood, pain. Discussed side effect of dry mouth, constipation and risk of serotonin syndrome. Patient is advised to discontinue this medication if she were to develop symptoms, which includes diarrhea, tremors and confusion. Will continue trazodone for insomnia and valium prn for anxiety. Discussed risks of somnolence and respiratory suppression, especially with concomitant use of opioids. She will greatly benefit from supportive therapy given her demoralization; will make a referral.   # ADHD She was diagnosed ADHD after evaluation in [facility]. She has been prescribed on Adderall by her PCP. Discussed the risk of hypertension, worsening anxiety and decreased appetite.   Plan 1. Continue duloxetine 120 mg daily  2. Start nortriptyline 25 mg at night 3. Return to clinic in one month for 30 mins 4. Referral to therapist (Ms. Peggy) (Patient is on Adderall XR 30 mg daily and valium 2 mg prn for anxiety prescribed by her PCP)   The patient demonstrates the following risk factors for suicide: Chronic risk factors for suicide include: psychiatric disorder of depression and chronic pain. Acute risk factors for suicide include: loss (financial, interpersonal, professional). Protective factors for this patient include: responsibility to others (children, family), coping skills and hope for the future. Considering these factors, the overall suicide risk at this point appears to be low. Patient is appropriate for outpatient follow up.  Treatment Plan Summary: Plan as above  The duration of this appointment visit was 30 minutes of face-to-face time with the patient.  Greater than 50% of this time was spent in counseling, explanation of   diagnosis, planning of further management, and coordination of care.  Neysa Hotter, MD 09/11/2016, 10:59 AM

## 2016-09-16 NOTE — Progress Notes (Signed)
BH MD/PA/NP OP Progress Note  09/17/2016 5:13 PM Lauren Greene  MRN:  [id number]  Chief Complaint:  Chief Complaint    Follow-up; Depression     Subjective:  "I don't do it (take care) for myself" HPI:  Patient presents for follow up appointment for depression. She has not seen any difference after starting nortriptyline. She tries to be busy so that she does not stay in the house. She talks about her old sister with bipolar disorder, who attempted suicide twice. She offered to live together and her sister will move in very soon. She planned for her nieces' baby shower and will visit her very soon. She enjoys yard work. She becomes tearful when she talks about her pain. She feels that she is not the same person anymore due to her pain. She feels very frustrated especially in the morning when she suffers more pain. She feels fatigue at times. She sleeps better on Trazodone. She has "emotional eating" when she is bored. She denies SI. She reports mild anxiety and takes valium as needed. She denies panic attacks.   Wt Readings from Last 3 Encounters:  09/17/16 262 lb (118.8 kg)  09/05/16 261 lb 12.8 oz (118.8 kg)  08/19/16 264 lb (119.7 kg)    Visit Diagnosis:    ICD-10-CM   1. Moderate episode of recurrent major depressive disorder (HCC) F33.1     Past Psychiatric History:  I have reviewed the patient's psychiatry history in detail and updated the patient record. Outpatient: She was evaluated for ADD in [facility] in 2014; started on Adderall since then. Psychiatry admission: denies  Previous suicide attempt: denies Past trials of medication: Celexa, Wellbutrin ("mean"), Prozac, Effexor, Mirtazapine, duloxetine, nortriptyline (dry mouth),  Adderall (4 years), gabapentin, Lyrica (swelling), lorazepam, valium  Past Medical History:  Past Medical History:  Diagnosis Date  . ADHD (attention deficit hyperactivity disorder)   . Arthritis   . Back pain   . Broken femur (HCC) 08/27/12   . Migraine   . Osteoarthritis   . Thyroid disease    hypothyroidism    Past Surgical History:  Procedure Laterality Date  . BACK SURGERY    . CERVICAL BIOPSY  W/ LOOP ELECTRODE EXCISION  1990's   in [place], [place]  . ENDOMETRIAL BIOPSY  04-05-05 and 02-12-06   04-05-05 benign but too rapid growth. RXd Provera and repeat Bx 16mo. 02-12-06 Bx benign--Dr. Leda Quail  . FEMUR IM NAIL Left 08/27/2012   Procedure: INTRAMEDULLARY (IM) RETROGRADE FEMORAL NAILING;  Surgeon: Kathryne Hitch, MD;  Location: [facility] OR;  Service: Orthopedics;  Laterality: Left;  . HEEL SPUR SURGERY    . IM NAILING FEMORAL SHAFT FRACTURE Left 08/27/2012   Dr Magnus Ivan  . TOTAL KNEE ARTHROPLASTY     x2  . TUBAL LIGATION      Family Psychiatric History:  I have reviewed the patient's family history in detail and updated the patient record.  Mother- bipolar, sister - bipolar,   Family History:  Family History  Problem Relation Age of Onset  . Cancer Mother   . Breast cancer Mother   . Bipolar disorder Mother   . Heart failure Father   . Cancer Father   . Cancer Other   . Heart failure Other   . Bipolar disorder Sister     Social History:  Social History   Social History  . Marital status: Single    Spouse name: N/A  . Number of children: N/A  . Years of  education: N/A   Social History Main Topics  . Smoking status: Former Smoker    Years: 10.00    Types: Cigarettes    Quit date: 08/16/2000  . Smokeless tobacco: Never Used  . Alcohol use 0.0 oz/week     Comment: rarely  . Drug use: No  . Sexual activity: Not Currently    Partners: Female    Birth control/ protection:    Other Topics Concern  . None   Social History Narrative  . None    Allergies:  Allergies  Allergen Reactions  . Wellbutrin [Bupropion]     Anger issues  . Lyrica [Pregabalin] Swelling    Leg swelling    Metabolic Disorder Labs: Lab Results  Component Value Date   HGBA1C 5.6 07/22/2014   MPG 103  02/16/2013   MPG 111 07/24/2010   No results found for: PROLACTIN Lab Results  Component Value Date   CHOL 181 01/12/2016   TRIG 132 01/12/2016   HDL 47 01/12/2016   CHOLHDL 3.9 01/12/2016   VLDL 13 07/10/2012   LDLCALC 108 (H) 01/12/2016   LDLCALC 75 07/06/2015     Current Medications: Current Outpatient Prescriptions  Medication Sig Dispense Refill  . amphetamine-dextroamphetamine (ADDERALL XR) 30 MG 24 hr capsule 2 qam 60 capsule 0  . clindamycin (CLEOCIN) 300 MG capsule Take 1 capsule (300 mg total) by mouth 3 (three) times daily. 30 capsule 0  . clobetasol ointment (TEMOVATE) 0.05 % Apply 1 application topically 2 (two) times daily. Apply as directed twice daily 60 g 1  . diazepam (VALIUM) 2 MG tablet Take 1 tablet (2 mg total) by mouth daily as needed for anxiety. 30 tablet 1  . Diclofenac Sodium CR 100 MG 24 hr tablet TAKE 1 TABLET BY MOUTH TWICE DAILY. TAKE WITH FOOD. 60 tablet 0  . Diphenhyd-Hydrocort-Nystatin (FIRST-DUKES MOUTHWASH) SUSP Take one tsp swish and spit qid 1 Bottle 0  . DULoxetine (CYMBALTA) 60 MG capsule Take 2 capsules (120 mg total) by mouth daily. 60 capsule 1  . lidocaine (XYLOCAINE) 2 % solution TAKE 5 TO 10 MLS BY MOUTH TWICE DAILY AS NEEDED. 100 mL 0  . methocarbamol (ROBAXIN) 750 MG tablet Take 750 mg by mouth 2 (two) times daily as needed for muscle spasms.    Marland Kitchen. MOVANTIK 25 MG TABS tablet Take 1 tablet by mouth daily.  11  . nortriptyline (PAMELOR) 50 MG capsule Take 1 capsule (50 mg total) by mouth at bedtime. 30 capsule 1  . nystatin (MYCOSTATIN/NYSTOP) powder APPLY TO AFFECTED AREA(S) TOPICALLY TWICE DAILY. 45 g 0  . omeprazole (PRILOSEC) 40 MG capsule Take 1 capsule (40 mg total) by mouth daily. 30 capsule 11  . ondansetron (ZOFRAN ODT) 8 MG disintegrating tablet Take 1 tablet (8 mg total) by mouth every 8 (eight) hours as needed for nausea or vomiting. 20 tablet 2  . oxyCODONE-acetaminophen (PERCOCET) 10-325 MG tablet One q 4 hours prn Max 5 per  day 150 tablet 0  . OXYCONTIN 15 MG 12 hr tablet Take 1 tablet by mouth 2 (two) times daily.  0  . traZODone (DESYREL) 100 MG tablet Take 2 tablets (200 mg total) by mouth at bedtime. 60 tablet 1  . triamcinolone (KENALOG) 0.1 % paste APPLY TO THE AFFECTED AREA ON THE TONGUE TWICE DAILY AS NEEDED. 5 g 0   No current facility-administered medications for this visit.     Neurologic: Headache: No Seizure: No Paresthesias: No  Musculoskeletal: Strength & Muscle Tone: within normal  limits Gait & Station: normal Patient leans: N/A  Psychiatric Specialty Exam: Review of Systems  Musculoskeletal: Positive for myalgias.  Psychiatric/Behavioral: Positive for depression. Negative for hallucinations, substance abuse and suicidal ideas. The patient is nervous/anxious. The patient does not have insomnia.   All other systems reviewed and are negative.   Blood pressure 140/90, pulse 78, height 5\' 4"  (1.626 m), weight 262 lb (118.8 kg), last menstrual period 03/18/2004.Body mass index is 44.97 kg/m.  General Appearance: Fairly Groomed  Eye Contact:  Good  Speech:  Clear and Coherent  Volume:  Normal  Mood:  "fine"  Affect:  Tearful and slightly down  Thought Process:  Coherent and Goal Directed  Orientation:  Full (Time, Place, and Person)  Thought Content: Logical Perceptions: denies AH/VH  Suicidal Thoughts:  No  Homicidal Thoughts:  No  Memory:  Immediate;   Good Recent;   Good Remote;   Good  Judgement:  Good  Insight:  Fair  Psychomotor Activity:  Normal  Concentration:  Concentration: Good and Attention Span: Good  Recall:  Good  Fund of Knowledge: Good  Language: Good  Akathisia:  No  Handed:  Right  AIMS (if indicated):  N/A  Assets:  Communication Skills Desire for Improvement  ADL's:  Intact  Cognition: WNL  Sleep:  Good on medication   Assessment Lauren Greene is a 51 year old female with depression, ADHD per chart, anxiety, hyperhydrosis, chronic pain. Patient  presents for follow up appointment for Moderate episode of recurrent major depressive disorder (HCC)  # MDD, moderate, recurrent without psychotic features Exam is notable for her tearful affect when she describes pain, while she appears to minimize her neurovegetative symptoms (partly secondary to coping skill). Will continue duloxetine and uptitrate nortriptyline to target depression and pain. Will continue to monitor dry mouth she experiences since starting this medication. Will continue trazodone for insomnia. Discussed self compassion and behavioral activation. She will greatly benefit from supportive therapy given demoralization; she is encouraged to continue to see a therapist.   # ADHD She was diagnosed with ADHD after evaluation in [facility]. PCP prescribes Adderall. Will monitor for side effects.   Plan 1. Continue duloxetine 120 mg daily 2. Increase nortriptyline 50 mg at night 3. Return to clinic in one month for 30 mins 4. Referral to therapist (Ms. Peggy) (Patient is on Adderall XR 30 mg daily and valium 2 mg prn for anxiety prescribed by her PCP)   The patient demonstrates the following risk factors for suicide: Chronic risk factors for suicide include: psychiatric disorder of depressionand chronic pain. Acute risk factorsfor suicide include: loss (financial, interpersonal, professional). Protective factorsfor this patient include: responsibility to others (children, family), coping skills and hope for the future. Considering these factors, the overall suicide risk at this point appears to be low. Patient isappropriate for outpatient follow up.  Treatment Plan Summary:Plan as above  The duration of this appointment visit was 30 minutes of face-to-face time with the patient.  Greater than 50% of this time was spent in counseling, explanation of  diagnosis, planning of further management, and coordination of care.  Neysa Hotter, MD 09/17/2016, 5:13 PM

## 2016-09-17 ENCOUNTER — Encounter (HOSPITAL_COMMUNITY): Payer: Self-pay | Admitting: Psychiatry

## 2016-09-17 ENCOUNTER — Ambulatory Visit (INDEPENDENT_AMBULATORY_CARE_PROVIDER_SITE_OTHER): Payer: BLUE CROSS/BLUE SHIELD | Admitting: Psychiatry

## 2016-09-17 VITALS — BP 140/90 | HR 78 | Ht 64.0 in | Wt 262.0 lb

## 2016-09-17 DIAGNOSIS — F419 Anxiety disorder, unspecified: Secondary | ICD-10-CM

## 2016-09-17 DIAGNOSIS — Z87891 Personal history of nicotine dependence: Secondary | ICD-10-CM | POA: Diagnosis not present

## 2016-09-17 DIAGNOSIS — Z818 Family history of other mental and behavioral disorders: Secondary | ICD-10-CM

## 2016-09-17 DIAGNOSIS — F331 Major depressive disorder, recurrent, moderate: Secondary | ICD-10-CM | POA: Diagnosis not present

## 2016-09-17 DIAGNOSIS — F909 Attention-deficit hyperactivity disorder, unspecified type: Secondary | ICD-10-CM

## 2016-09-17 DIAGNOSIS — R61 Generalized hyperhidrosis: Secondary | ICD-10-CM | POA: Diagnosis not present

## 2016-09-17 DIAGNOSIS — G8929 Other chronic pain: Secondary | ICD-10-CM

## 2016-09-17 DIAGNOSIS — M791 Myalgia: Secondary | ICD-10-CM | POA: Diagnosis not present

## 2016-09-17 MED ORDER — TRAZODONE HCL 100 MG PO TABS: 200.0000 mg | ORAL_TABLET | Freq: Every day | ORAL | 1 refills | Status: DC

## 2016-09-17 MED ORDER — DULOXETINE HCL 60 MG PO CPEP: 120.0000 mg | ORAL_CAPSULE | Freq: Every day | ORAL | 1 refills | Status: DC

## 2016-09-17 MED ORDER — NORTRIPTYLINE HCL 50 MG PO CAPS: 50.0000 mg | ORAL_CAPSULE | Freq: Every day | ORAL | 1 refills | Status: DC

## 2016-09-17 NOTE — Patient Instructions (Addendum)
1. Continue duloxetine 120 mg daily 2. Increase nortriptyline 50 mg at night 3. Return to clinic in one month for 30 mins

## 2016-09-25 ENCOUNTER — Ambulatory Visit (HOSPITAL_COMMUNITY): Payer: Self-pay | Admitting: Psychiatry

## 2016-10-07 ENCOUNTER — Other Ambulatory Visit: Payer: Self-pay | Admitting: Family Medicine

## 2016-10-07 NOTE — Telephone Encounter (Signed)
Last seen 09/17/16 

## 2016-10-09 ENCOUNTER — Other Ambulatory Visit: Payer: Self-pay | Admitting: Family Medicine

## 2016-10-09 ENCOUNTER — Ambulatory Visit (HOSPITAL_COMMUNITY): Payer: Self-pay | Admitting: Psychiatry

## 2016-10-11 ENCOUNTER — Telehealth (INDEPENDENT_AMBULATORY_CARE_PROVIDER_SITE_OTHER): Payer: Self-pay | Admitting: Orthopedic Surgery

## 2016-10-11 NOTE — Telephone Encounter (Signed)
Call patient and lets postponed surgery until the wound has healed. This would place her at increased risk of infection for an elective procedure.

## 2016-10-11 NOTE — Telephone Encounter (Signed)
[name] left a message on my voicemail. She is scheduled for chevron/aiken osteotomy at [facility] on Tuesday 10/15/16.  She is [facility] being [facility] for a wound on the top part of her right thigh. There is question of wether or not she should be able to continue with the foot surgery while being treated for this.  Per [name], her wound MD advised her that it would be up to Dr. Lajoyce Cornersuda if she could still have her surgery or not.  What are your thoughts?

## 2016-10-14 ENCOUNTER — Ambulatory Visit (INDEPENDENT_AMBULATORY_CARE_PROVIDER_SITE_OTHER): Payer: BLUE CROSS/BLUE SHIELD | Admitting: Orthopedic Surgery

## 2016-10-14 NOTE — Progress Notes (Deleted)
BH MD/PA/NP OP Progress Note  10/14/2016 12:03 PM Lauren Hakelaine G Rosenzweig  MRN:  [id number]  Chief Complaint:  Subjective:  *** HPI: *** Visit Diagnosis: No diagnosis found.  Past Psychiatric History:  I have reviewed the patient's psychiatry history in detail and updated the patient record. Outpatient: [facility] [place]; started on Adderall since then. Psychiatry admission: denies Previous suicide attempt: denies Past trials of medication: Celexa, Wellbutrin ("mean"), Prozac, Effexor, Mirtazapine, duloxetine, nortriptyline (dry mouth),  Adderall (4 years), gabapentin, Lyrica (swelling), lorazepam, valium  Past Medical History:  Past Medical History:  Diagnosis Date  . ADHD (attention deficit hyperactivity disorder)   . Arthritis   . Back pain   . Broken femur (HCC) 08/27/12  . Migraine   . Osteoarthritis   . Thyroid disease    hypothyroidism    Past Surgical History:  Procedure Laterality Date  . BACK SURGERY    . CERVICAL BIOPSY  W/ LOOP ELECTRODE EXCISION  1990's   in [place], [place]  . ENDOMETRIAL BIOPSY  04-05-05 and 02-12-06   04-05-05 benign but too rapid growth. RXd Provera and repeat Bx 94mo. 02-12-06 Bx benign--Dr. Leda QuailSuzanne Miller  . FEMUR IM NAIL Left 08/27/2012   Procedure: INTRAMEDULLARY (IM) RETROGRADE FEMORAL NAILING;  Surgeon: Kathryne Hitchhristopher Y Blackman, MD;  Location: [facility] OR;  Service: Orthopedics;  Laterality: Left;  . HEEL SPUR SURGERY    . IM NAILING FEMORAL SHAFT FRACTURE Left 08/27/2012   Dr Magnus IvanBLACKMAN  . TOTAL KNEE ARTHROPLASTY     x2  . TUBAL LIGATION      Family Psychiatric History:  I have reviewed the patient's family history in detail and updated the patient record. Mother- bipolar, sister - bipolar  Family History:  Family History  Problem Relation Age of Onset  . Cancer Mother   . Breast cancer Mother   . Bipolar disorder Mother   . Heart failure Father   . Cancer Father   . Cancer Other   . Heart failure Other   .  Bipolar disorder Sister     Social History:  Social History   Social History  . Marital status: Single    Spouse name: N/A  . Number of children: N/A  . Years of education: N/A   Social History Main Topics  . Smoking status: Former Smoker    Years: 10.00    Types: Cigarettes    Quit date: 08/16/2000  . Smokeless tobacco: Never Used  . Alcohol use 0.0 oz/week     Comment: rarely  . Drug use: No  . Sexual activity: Not Currently    Partners: Female    Birth control/ protection:    Other Topics Concern  . Not on file   Social History Narrative  . No narrative on file    Allergies:  Allergies  Allergen Reactions  . Wellbutrin [Bupropion]     Anger issues  . Lyrica [Pregabalin] Swelling    Leg swelling    Metabolic Disorder Labs: Lab Results  Component Value Date   HGBA1C 5.6 07/22/2014   MPG 103 02/16/2013   MPG 111 07/24/2010   No results found for: PROLACTIN Lab Results  Component Value Date   CHOL 181 01/12/2016   TRIG 132 01/12/2016   HDL 47 01/12/2016   CHOLHDL 3.9 01/12/2016   VLDL 13 07/10/2012   LDLCALC 108 (H) 01/12/2016   LDLCALC 75 07/06/2015     Current Medications: Current Outpatient Prescriptions  Medication Sig Dispense Refill  .  amphetamine-dextroamphetamine (ADDERALL XR) 30 MG 24 hr capsule 2 qam 60 capsule 0  . clindamycin (CLEOCIN) 300 MG capsule Take 1 capsule (300 mg total) by mouth 3 (three) times daily. 30 capsule 0  . clobetasol ointment (TEMOVATE) 0.05 % Apply 1 application topically 2 (two) times daily. Apply as directed twice daily 60 g 1  . diazepam (VALIUM) 2 MG tablet Take 1 tablet (2 mg total) by mouth daily as needed for anxiety. 30 tablet 1  . Diclofenac Sodium CR 100 MG 24 hr tablet TAKE 1 TABLET BY MOUTH TWICE DAILY. TAKE WITH FOOD. 60 tablet 0  . Diphenhyd-Hydrocort-Nystatin (FIRST-DUKES MOUTHWASH) SUSP Take one tsp swish and spit qid 1 Bottle 0  . DULoxetine (CYMBALTA) 60 MG capsule Take 2 capsules (120 mg total) by  mouth daily. 60 capsule 1  . lidocaine (XYLOCAINE) 2 % solution TAKE 5 TO 10 MLS BY MOUTH TWICE DAILY AS NEEDED. 100 mL 0  . methocarbamol (ROBAXIN) 750 MG tablet Take 750 mg by mouth 2 (two) times daily as needed for muscle spasms.    Marland Kitchen. MOVANTIK 25 MG TABS tablet Take 1 tablet by mouth daily.  11  . nortriptyline (PAMELOR) 50 MG capsule Take 1 capsule (50 mg total) by mouth at bedtime. 30 capsule 1  . nystatin (MYCOSTATIN/NYSTOP) powder APPLY TO AFFECTED AREA(S) TOPICALLY TWICE DAILY. 45 g 0  . omeprazole (PRILOSEC) 40 MG capsule Take 1 capsule (40 mg total) by mouth daily. 30 capsule 11  . ondansetron (ZOFRAN ODT) 8 MG disintegrating tablet Take 1 tablet (8 mg total) by mouth every 8 (eight) hours as needed for nausea or vomiting. 20 tablet 2  . oxyCODONE-acetaminophen (PERCOCET) 10-325 MG tablet One q 4 hours prn Max 5 per day 150 tablet 0  . OXYCONTIN 15 MG 12 hr tablet Take 1 tablet by mouth 2 (two) times daily.  0  . traZODone (DESYREL) 100 MG tablet Take 2 tablets (200 mg total) by mouth at bedtime. 60 tablet 1  . triamcinolone (KENALOG) 0.1 % paste APPLY TO THE AFFECTED AREA ON THE TONGUE TWICE DAILY AS NEEDED. 5 g 0   No current facility-administered medications for this visit.     Neurologic: Headache: No Seizure: No Paresthesias: No  Musculoskeletal: Strength & Muscle Tone: within normal limits Gait & Station: normal Patient leans: N/A  Psychiatric Specialty Exam: ROS  Last menstrual period 03/18/2004.There is no height or weight on file to calculate BMI.  General Appearance: Fairly Groomed  Eye Contact:  Good  Speech:  Clear and Coherent  Volume:  Normal  Mood:  {BHH MOOD:22306}  Affect:  {Affect (PAA):22687}  Thought Process:  Coherent and Goal Directed  Orientation:  Full (Time, Place, and Person)  Thought Content: Logical   Suicidal Thoughts:  {ST/HT (PAA):22692}  Homicidal Thoughts:  {ST/HT (PAA):22692}  Memory:  Immediate;   Good Recent;   Good Remote;    Good  Judgement:  {Judgement (PAA):22694}  Insight:  {Insight (PAA):22695}  Psychomotor Activity:  Normal  Concentration:  Concentration: Good and Attention Span: Good  Recall:  Good  Fund of Knowledge: Good  Language: Good  Akathisia:  No  Handed:  Right  AIMS (if indicated):  N/A  Assets:  Communication Skills Desire for Improvement  ADL's:  Intact  Cognition: WNL  Sleep:  ***   Assessment Lauren Greene is a 51 y.o. year old female with a history of depression, ADHD per chart, hyperhydrosis, chronic pain, who presents for follow up appointment for No diagnosis  found.  # MDD, moderate, recurrent without psychotic features  Exam is notable for her tearful affect when she describes pain, while she appears to minimize her neurovegetative symptoms (partly secondary to coping skill). Will continue duloxetine and uptitrate nortriptyline to target depression and pain. Will continue to monitor dry mouth she experiences since starting this medication. Will continue trazodone for insomnia. Discussed self compassion and behavioral activation. She will greatly benefit from supportive therapy given demoralization; she is encouraged to continue to see a therapist.   # ADHD  She was diagnosed with ADHD after evaluation in [facility]. PCP prescribes Adderall. Will monitor for side effects.   Plan 1. Continue duloxetine 120 mg daily 2. Increase nortriptyline 50 mg at night 3. Return to clinic in one month for 30 mins 4. Referral to therapist (Ms. Peggy) (Patient is on Adderall XR 30 mg daily and valium 2 mg prn for anxiety prescribed by her PCP)  Treatment Plan Summary:Plan as above   Neysa Hotter, MD 10/14/2016, 12:03 PM

## 2016-10-17 ENCOUNTER — Ambulatory Visit (HOSPITAL_COMMUNITY): Payer: BLUE CROSS/BLUE SHIELD | Admitting: Psychiatry

## 2016-10-30 ENCOUNTER — Telehealth: Payer: Self-pay | Admitting: Family Medicine

## 2016-10-30 NOTE — Telephone Encounter (Signed)
Please see surgical pathology report in red folder in office.  

## 2016-10-30 NOTE — Telephone Encounter (Signed)
That the results, shows benign, no malignancy

## 2016-10-30 NOTE — Telephone Encounter (Signed)
Review report of surgical pathology in results folder. °

## 2016-11-04 ENCOUNTER — Telehealth: Payer: Self-pay | Admitting: *Deleted

## 2016-11-04 NOTE — Telephone Encounter (Signed)
Fax from [facility] [facility] [place]. adderall xr 30mg  er capsules. 2 capsules daily approved from 10/31/16 - 10/30/2019. Reference number [id number]

## 2016-11-05 ENCOUNTER — Other Ambulatory Visit: Payer: Self-pay | Admitting: Family Medicine

## 2016-11-05 NOTE — Telephone Encounter (Signed)
Last seen 08/02/16 for abrasion

## 2016-11-06 ENCOUNTER — Telehealth (HOSPITAL_COMMUNITY): Payer: Self-pay | Admitting: *Deleted

## 2016-11-06 ENCOUNTER — Other Ambulatory Visit (HOSPITAL_COMMUNITY): Payer: Self-pay | Admitting: Psychiatry

## 2016-11-06 MED ORDER — DIAZEPAM 2 MG PO TABS: 2.0000 mg | ORAL_TABLET | Freq: Every day | ORAL | 0 refills | Status: DC | PRN

## 2016-11-06 NOTE — Telephone Encounter (Signed)
Per [employer], last valium filled on 03/05/2016.   Ordered valium 2 mg daily prn anxiety for 30 days.

## 2016-11-06 NOTE — Telephone Encounter (Signed)
Pt called and lm stating she would like refills for her Valium. Per pt voicemail, she schedule appt with provider for 12-04-2016. Pt was last seen on 09-17-2016. Per pt chart, pt Valium was last filled on 03-05-2016 with 30 tabs 1 refill. Pt number is [phone].

## 2016-11-08 NOTE — Telephone Encounter (Signed)
noted 

## 2016-11-14 NOTE — Progress Notes (Deleted)
51 y.o. [id number] SingleCaucasianF here for annual exam.    Patient's last menstrual period was 03/18/2004.          Sexually active: {yes no:314532}  The current method of family planning is none.    Exercising: {yes AV:409811}no:314532}  {types:19826} Smoker:  Former smoker  Health Maintenance: Pap:  08/07/15 negative, 01/07/13 negative, HR HPV negative  History of abnormal Pap:  yes MMG:  08/17/15 BIRADS 1 negative  Colonoscopy:  *** BMD:   2014 Dr. Sherene SiresWainer's office  TDaP:  08/03/13  Pneumonia vaccine(s):  *** Zostavax:   *** Hep C testing: *** Screening Labs: ***, Hb today: ***, Urine today: ***   reports that she quit smoking about 16 years ago. Her smoking use included Cigarettes. She quit after 10.00 years of use. She has never used smokeless tobacco. She reports that she drinks alcohol. She reports that she does not use drugs.  Past Medical History:  Diagnosis Date  . ADHD (attention deficit hyperactivity disorder)   . Arthritis   . Back pain   . Broken femur (HCC) 08/27/12  . Migraine   . Osteoarthritis   . Thyroid disease    hypothyroidism    Past Surgical History:  Procedure Laterality Date  . BACK SURGERY    . CERVICAL BIOPSY  W/ LOOP ELECTRODE EXCISION  1990's   in [place], [place]  . ENDOMETRIAL BIOPSY  04-05-05 and 02-12-06   04-05-05 benign but too rapid growth. RXd Provera and repeat Bx 9mo. 02-12-06 Bx benign--Dr. Leda QuailSuzanne Miller  . FEMUR IM NAIL Left 08/27/2012   Procedure: INTRAMEDULLARY (IM) RETROGRADE FEMORAL NAILING;  Surgeon: Kathryne Hitchhristopher Y Blackman, MD;  Location: [facility];  Service: Orthopedics;  Laterality: Left;  . HEEL SPUR SURGERY    . IM NAILING FEMORAL SHAFT FRACTURE Left 08/27/2012   Dr Magnus IvanBLACKMAN  . TOTAL KNEE ARTHROPLASTY     x2  . TUBAL LIGATION      Current Outpatient Prescriptions  Medication Sig Dispense Refill  . amphetamine-dextroamphetamine (ADDERALL XR) 30 MG 24 hr capsule 2 qam 60 capsule 0  . clindamycin (CLEOCIN) 300 MG capsule Take 1 capsule (300  mg total) by mouth 3 (three) times daily. 30 capsule 0  . clobetasol ointment (TEMOVATE) 0.05 % Apply 1 application topically 2 (two) times daily. Apply as directed twice daily 60 g 1  . diazepam (VALIUM) 2 MG tablet Take 1 tablet (2 mg total) by mouth daily as needed for anxiety. 30 tablet 0  . Diclofenac Sodium CR 100 MG 24 hr tablet TAKE 1 TABLET BY MOUTH TWICE DAILY. TAKE WITH FOOD. 60 tablet 0  . Diphenhyd-Hydrocort-Nystatin (FIRST-DUKES MOUTHWASH) SUSP Take one tsp swish and spit qid 1 Bottle 0  . DULoxetine (CYMBALTA) 60 MG capsule Take 2 capsules (120 mg total) by mouth daily. 60 capsule 1  . lidocaine (XYLOCAINE) 2 % solution TAKE 5 TO 10 MLS BY MOUTH TWICE DAILY AS NEEDED. 100 mL 0  . methocarbamol (ROBAXIN) 750 MG tablet Take 750 mg by mouth 2 (two) times daily as needed for muscle spasms.    Marland Kitchen. MOVANTIK 25 MG TABS tablet Take 1 tablet by mouth daily.  11  . nortriptyline (PAMELOR) 50 MG capsule Take 1 capsule (50 mg total) by mouth at bedtime. 30 capsule 1  . nystatin (MYCOSTATIN/NYSTOP) powder APPLY TO AFFECTED AREA(S) TOPICALLY TWICE DAILY. 45 g 0  . omeprazole (PRILOSEC) 40 MG capsule Take 1 capsule (40 mg total) by mouth daily. 30 capsule 11  . ondansetron (ZOFRAN ODT) 8  MG disintegrating tablet Take 1 tablet (8 mg total) by mouth every 8 (eight) hours as needed for nausea or vomiting. 20 tablet 2  . oxyCODONE-acetaminophen (PERCOCET) 10-325 MG tablet One q 4 hours prn Max 5 per day 150 tablet 0  . OXYCONTIN 15 MG 12 hr tablet Take 1 tablet by mouth 2 (two) times daily.  0  . traZODone (DESYREL) 100 MG tablet Take 2 tablets (200 mg total) by mouth at bedtime. 60 tablet 1  . triamcinolone (KENALOG) 0.1 % paste APPLY TO THE AFFECTED AREA ON THE TONGUE TWICE DAILY AS NEEDED. 5 g 0   No current facility-administered medications for this visit.     Family History  Problem Relation Age of Onset  . Cancer Mother   . Breast cancer Mother   . Bipolar disorder Mother   . Heart failure  Father   . Cancer Father   . Cancer Other   . Heart failure Other   . Bipolar disorder Sister     ROS:  Pertinent items are noted in HPI.  Otherwise, a comprehensive ROS was negative.  Exam:   LMP 03/18/2004   Weight change: @WEIGHTCHANGE @ Height:      Ht Readings from Last 3 Encounters:  09/05/16 5\' 4"  (1.626 m)  08/02/16 5\' 5"  (1.651 m)  07/26/16 5' 6.5" (1.689 m)    General appearance: alert, cooperative and appears stated age Head: Normocephalic, without obvious abnormality, atraumatic Neck: no adenopathy, supple, symmetrical, trachea midline and thyroid {EXAM; THYROID:18604} Lungs: clear to auscultation bilaterally Breasts: {Exam; breast:13139::"normal appearance, no masses or tenderness"} Heart: regular rate and rhythm Abdomen: soft, non-tender; bowel sounds normal; no masses,  no organomegaly Extremities: extremities normal, atraumatic, no cyanosis or edema Skin: Skin color, texture, turgor normal. No rashes or lesions Lymph nodes: Cervical, supraclavicular, and axillary nodes normal. No abnormal inguinal nodes palpated Neurologic: Grossly normal   Pelvic: External genitalia:  no lesions              Urethra:  normal appearing urethra with no masses, tenderness or lesions              Bartholins and Skenes: normal                 Vagina: normal appearing vagina with normal color and discharge, no lesions              Cervix: {exam; cervix:14595}              Pap taken: {yes no:314532} Bimanual Exam:  Uterus:  {exam; uterus:12215}              Adnexa: {exam; adnexa:12223}               Rectovaginal: Confirms               Anus:  normal sphincter tone, no lesions  Chaperone was present for exam.  A:  Well Woman with normal exam  P:   {plan; gyn:5269::"mammogram","pap smear","return annually or prn"}

## 2016-11-15 ENCOUNTER — Ambulatory Visit: Payer: BLUE CROSS/BLUE SHIELD | Admitting: Obstetrics & Gynecology

## 2016-11-20 ENCOUNTER — Encounter: Payer: Self-pay | Admitting: Family Medicine

## 2016-11-20 NOTE — Telephone Encounter (Signed)
Please have Enid DerryBrendale work with the patient to put him referral for pain management. Please inform patient that this is being done.

## 2016-11-21 ENCOUNTER — Telehealth: Payer: Self-pay | Admitting: Family Medicine

## 2016-11-21 DIAGNOSIS — G8929 Other chronic pain: Secondary | ICD-10-CM

## 2016-11-21 NOTE — Telephone Encounter (Signed)
Please do referral.

## 2016-11-21 NOTE — Telephone Encounter (Signed)
See below, pt requesting new pain management doc  Please initiate referral in system so that I may process  Dear Dr. Lorin PicketScott,  As of Sept. 1st I have no where to go for pain management. Could you please ask Brendell to get me in to the closest Pain Clinic [place] in [place].  Thank you so much, Lauren EinsteinElaine Morrical

## 2016-11-21 NOTE — Telephone Encounter (Signed)
Referral put in.

## 2016-11-27 ENCOUNTER — Telehealth: Payer: Self-pay | Admitting: Family Medicine

## 2016-11-27 NOTE — Telephone Encounter (Signed)
#  1 typically we do not do this. This patient is on significant amount of pain medicines beyond what I prescribed. I would like to have the most recent past couple visits from Dr. Norville HaggardHarkin sent to us for our review

## 2016-11-27 NOTE — Telephone Encounter (Signed)
This is a situation where the patient will need to get her pain medications through pain management

## 2016-11-27 NOTE — Telephone Encounter (Signed)
Pt wants to know if Dr. Lorin PicketScott can write her a Rx for her Percocet until she can get in with a new pain management specialist  Pt states that Dr. Ollen BowlHarkins d/c her not taking her pain meds as he'd prescribed  Am currently working on finding a new pain management for pt  Explained to pt that I would send a message to ask Dr. Lorin PicketScott b & if he odes agree to write her a Rx it would be limited & temporary that he would not be taking this on due to pt being referred to pain management   Please advise & call pt

## 2016-11-28 ENCOUNTER — Ambulatory Visit (INDEPENDENT_AMBULATORY_CARE_PROVIDER_SITE_OTHER): Payer: BLUE CROSS/BLUE SHIELD | Admitting: Psychiatry

## 2016-11-28 ENCOUNTER — Encounter: Payer: Self-pay | Admitting: Family Medicine

## 2016-11-28 ENCOUNTER — Encounter (HOSPITAL_COMMUNITY): Payer: Self-pay | Admitting: Psychiatry

## 2016-11-28 VITALS — BP 114/68 | HR 75 | Ht 64.0 in | Wt 256.0 lb

## 2016-11-28 DIAGNOSIS — Z818 Family history of other mental and behavioral disorders: Secondary | ICD-10-CM | POA: Diagnosis not present

## 2016-11-28 DIAGNOSIS — M549 Dorsalgia, unspecified: Secondary | ICD-10-CM | POA: Diagnosis not present

## 2016-11-28 DIAGNOSIS — M2550 Pain in unspecified joint: Secondary | ICD-10-CM

## 2016-11-28 DIAGNOSIS — Z87891 Personal history of nicotine dependence: Secondary | ICD-10-CM

## 2016-11-28 DIAGNOSIS — F419 Anxiety disorder, unspecified: Secondary | ICD-10-CM

## 2016-11-28 DIAGNOSIS — F331 Major depressive disorder, recurrent, moderate: Secondary | ICD-10-CM

## 2016-11-28 DIAGNOSIS — R450 Nervousness: Secondary | ICD-10-CM

## 2016-11-28 MED ORDER — TRAZODONE HCL 100 MG PO TABS: 200.0000 mg | ORAL_TABLET | Freq: Every day | ORAL | 1 refills | Status: DC

## 2016-11-28 MED ORDER — DULOXETINE HCL 60 MG PO CPEP: 120.0000 mg | ORAL_CAPSULE | Freq: Every day | ORAL | 1 refills | Status: DC

## 2016-11-28 MED ORDER — DIAZEPAM 2 MG PO TABS: 2.0000 mg | ORAL_TABLET | Freq: Every day | ORAL | 0 refills | Status: DC | PRN

## 2016-11-28 NOTE — Telephone Encounter (Signed)
Called pt to notify that Dr. Lorin PicketScott states that the pt needs to get these meds through pain management   Had to leave message on voicemail, explained that Dr. Lorin PicketScott would not be comfortable writing Rx for her pain meds, this is simply beyond his scope & also informed pt that we have faxed her referral to Dr. Laurian Brim'Toole for pain management (he may require pt to have previous pain management records sent to him for review before he decides to take her case)

## 2016-11-28 NOTE — Patient Instructions (Signed)
1. Continue duloxetine 120 mg daily 2. Discontinue nortriptyline 3. Continue Trazodone 200 mg at night as needed for sleep 4. Continue valium 2 mg daily as needed for anxiety (reordered another month) 5. Return to clinic in one month for 30 mins
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
## 2016-12-04 ENCOUNTER — Ambulatory Visit (HOSPITAL_COMMUNITY): Payer: Self-pay | Admitting: Psychiatry

## 2016-12-05 ENCOUNTER — Other Ambulatory Visit: Payer: Self-pay | Admitting: Family Medicine

## 2016-12-19 ENCOUNTER — Ambulatory Visit: Payer: BLUE CROSS/BLUE SHIELD | Admitting: Family Medicine

## 2016-12-20 ENCOUNTER — Other Ambulatory Visit: Payer: Self-pay | Admitting: Family Medicine

## 2016-12-26 ENCOUNTER — Ambulatory Visit: Payer: BLUE CROSS/BLUE SHIELD | Admitting: Family Medicine

## 2016-12-30 ENCOUNTER — Encounter: Payer: Self-pay | Admitting: Family Medicine

## 2016-12-30 ENCOUNTER — Ambulatory Visit (INDEPENDENT_AMBULATORY_CARE_PROVIDER_SITE_OTHER): Payer: BLUE CROSS/BLUE SHIELD | Admitting: Family Medicine

## 2016-12-30 VITALS — BP 130/82 | Ht 64.0 in | Wt 256.0 lb

## 2016-12-30 DIAGNOSIS — E785 Hyperlipidemia, unspecified: Secondary | ICD-10-CM | POA: Diagnosis not present

## 2016-12-30 DIAGNOSIS — F988 Other specified behavioral and emotional disorders with onset usually occurring in childhood and adolescence: Secondary | ICD-10-CM

## 2016-12-30 DIAGNOSIS — Z79899 Other long term (current) drug therapy: Secondary | ICD-10-CM

## 2016-12-30 DIAGNOSIS — E875 Hyperkalemia: Secondary | ICD-10-CM

## 2016-12-30 MED ORDER — AMPHETAMINE-DEXTROAMPHET ER 30 MG PO CP24: ORAL_CAPSULE | ORAL | 0 refills | Status: DC

## 2016-12-30 MED ORDER — OXYCODONE-ACETAMINOPHEN 10-325 MG PO TABS: 1.0000 | ORAL_TABLET | Freq: Four times a day (QID) | ORAL | 0 refills | Status: DC | PRN

## 2016-12-30 NOTE — Progress Notes (Signed)
   Subjective:    Patient ID: Lauren Greene, female    DOB: July 13, 1965, 51 y.o.   MRN: [id number]  HPI Patient was seen today for ADD checkup. -weight, vital signs reviewed.  The following items were covered. -Compliance with medication : Yes  -Problems with completing homework, paying attention/taking good notes in school: Yes   - Eating patterns : Good  -sleeping:Good  -Additional issues or questions: Still in need of a pain clinic.States she would like to speak w Enid Derry regarding the referral. This patient has chronic pain with osteoarthritis as well as neuropathy. She is been under pain management. She had a falling out with previous pain management. I reviewed over those notes. The patient did not have a abnormal drug screen. Apparently patient was taking the medicine slightly different than what she was prescribed and was released because of this patient states that rather than taking the medicine spaced out she took 2 together on the mornings instated because of this she was released. We are currently trying to get her in with a different pain management group I have taught to the patient at length about how pain management management expects the patient to stick with what is prescribed and how it is prescribed to be taken   Review of Systems  Constitutional: Negative for activity change, appetite change and fatigue.  HENT: Negative for congestion.   Respiratory: Negative for cough.   Cardiovascular: Negative for chest pain.  Gastrointestinal: Negative for abdominal pain.  Skin: Negative for color change.  Neurological: Negative for headaches.  Psychiatric/Behavioral: Negative for behavioral problems.       Objective:   Physical Exam  Constitutional: She appears well-developed and well-nourished.  HENT:  Head: Normocephalic and atraumatic.  Eyes: Right eye exhibits no discharge. Left eye exhibits no discharge.  Cardiovascular: Normal rate, regular rhythm and  normal heart sounds.   No murmur heard. Pulmonary/Chest: Effort normal and breath sounds normal. No respiratory distress. She has no wheezes. She has no rales.  Neurological: She is alert.  Skin: Skin is warm and dry.  Psychiatric: She has a normal mood and affect.  Vitals reviewed.  Patient doesn't denies having drug abuse problem She states she does take Valium as prescribed occasionally to help With anxiety. She denies taking it with her pain medicine states it does not make her feel drowsy. She does not take it at nighttime.      Assessment & Plan:  ADD-3 prescriptions were written. She is follow-up in 3 months.  Hyperlipidemia check lipid profile  Chronic pain-this patient has been on pain medicine on a regular basis only has a couple days left we are trying to get her in with a new pain management center. I do not feel the patient is abusing medication. I wrote her for oxycodone 10 mg/325 mg, #120, one 4 times a day when necessary pain, I told her that is the maximum I can write for. Hopefully she will be in with pain management in the coming weeks if not we will need to see her back in 4 weeks. Drug registry was checked

## 2017-01-06 DIAGNOSIS — M5136 Other intervertebral disc degeneration, lumbar region: Secondary | ICD-10-CM | POA: Insufficient documentation

## 2017-01-06 DIAGNOSIS — M51369 Other intervertebral disc degeneration, lumbar region without mention of lumbar back pain or lower extremity pain: Secondary | ICD-10-CM | POA: Insufficient documentation

## 2017-01-08 ENCOUNTER — Other Ambulatory Visit: Payer: Self-pay | Admitting: Family Medicine

## 2017-01-22 NOTE — Progress Notes (Signed)
BH MD/PA/NP OP Progress Note  01/28/2017 2:08 PM Lauren Greene  MRN:  [id number]  Chief Complaint:  Chief Complaint    Depression; Follow-up     HPI:  Patient presents for follow-up appointment for depression.  She states that she was discharged from [place] [facility] as she failed urine drug test.  She states that she took her father's opioid which she had for many years due to severe pain on her mole. She regrets this act, stating that "I was stupid" and she did not think at that time. She will contact her PCP regarding her current situation. Although she has been doing well while on opioids, she is unsure if she will be able to move without medication. She states that she never abused medication and feels frustrated about the situation. She feels good otherwise, stating that she enjoyed gathering with her sisters. Her children will come to her place for Christmas. She states that she has not been able to talk with her son since she moved him out; she states that he used to be very cruel to animal and had lied multiple of times. He started to focus more on video games and he dropped out from college. Although he stayed with her for a while, he did not work and she left her move out of the house. She feels that she "failed" when she found out that he started to work after living with his grandmother. She also states that she feels she "lost" another son, stating that she lost one son in the past. She reports good sleep on medication. She denies feeling depressed. She denies SI. She feels anxious at times and takes valium less than every other day. She feels fatigue. She has fair appetite and concentration. She started to walk more and lost her weight.  Wt Readings from Last 3 Encounters:  01/28/17 254 lb (115.2 kg)  12/30/16 256 lb (116.1 kg)  11/28/16 256 lb (116.1 kg)    Per PMP,  Diazepam filled on 12/04/2016 On oxycodone, adderall XR  Visit Diagnosis:    ICD-10-CM   1. Moderate episode of  recurrent major depressive disorder (HCC) F33.1     Past Psychiatric History:  I have reviewed the patient's psychiatry history in detail and updated the patient record. Outpatient: She was evaluated for ADD in [facility] in 2014; started on Adderall since then. Psychiatry admission: denies Previous suicide attempt: denies Past trials of medication: Celexa, Wellbutrin ("mean"), Prozac, Effexor, Mirtazapine, duloxetine, nortriptyline (dry mouth), Adderall (4 years), gabapentin, Lyrica (swelling), lorazepam, valium    Past Medical History:  Past Medical History:  Diagnosis Date  . ADHD (attention deficit hyperactivity disorder)   . Arthritis   . Back pain   . Broken femur (HCC) 08/27/12  . Migraine   . Osteoarthritis   . Thyroid disease    hypothyroidism    Past Surgical History:  Procedure Laterality Date  . BACK SURGERY    . CERVICAL BIOPSY  W/ LOOP ELECTRODE EXCISION  1990's   in [place], [place]  . ENDOMETRIAL BIOPSY  04-05-05 and 02-12-06   04-05-05 benign but too rapid growth. RXd Provera and repeat Bx 30mo. 02-12-06 Bx benign--Dr. Leda Quail  . HEEL SPUR SURGERY    . IM NAILING FEMORAL SHAFT FRACTURE Left 08/27/2012   Dr Magnus Ivan  . TOTAL KNEE ARTHROPLASTY     x2  . TUBAL LIGATION      Family Psychiatric History:  I have reviewed the patient's family history in detail  and updated the patient record.    Family History:  Family History  Problem Relation Age of Onset  . Cancer Mother   . Breast cancer Mother   . Bipolar disorder Mother   . Heart failure Father   . Cancer Father   . Cancer Other   . Heart failure Other   . Bipolar disorder Sister      Social History:  Social History   Socioeconomic History  . Marital status: Single    Spouse name: None  . Number of children: None  . Years of education: None  . Highest education level: None  Social Needs  . Financial resource strain: None  . Food insecurity - worry: None  . Food insecurity -  inability: None  . Transportation needs - medical: None  . Transportation needs - non-medical: None  Occupational History  . None  Tobacco Use  . Smoking status: Former Smoker    Years: 10.00    Types: Cigarettes    Last attempt to quit: 08/16/2000    Years since quitting: 16.4  . Smokeless tobacco: Never Used  Substance and Sexual Activity  . Alcohol use: Yes    Alcohol/week: 0.0 oz    Comment: rarely  . Drug use: No  . Sexual activity: Not Currently    Partners: Female  Other Topics Concern  . None  Social History Narrative  . None    Allergies:  Allergies  Allergen Reactions  . Wellbutrin [Bupropion]     Anger issues  . Lyrica [Pregabalin] Swelling    Leg swelling    Metabolic Disorder Labs: Lab Results  Component Value Date   HGBA1C 5.6 07/22/2014   MPG 103 02/16/2013   MPG 111 07/24/2010   No results found for: PROLACTIN Lab Results  Component Value Date   CHOL 181 01/12/2016   TRIG 132 01/12/2016   HDL 47 01/12/2016   CHOLHDL 3.9 01/12/2016   VLDL 13 07/10/2012   LDLCALC 108 (H) 01/12/2016   LDLCALC 75 07/06/2015   Lab Results  Component Value Date   TSH 4.104 06/24/2013   TSH 4.142 01/07/2013    Therapeutic Level Labs: No results found for: LITHIUM No results found for: VALPROATE No components found for:  CBMZ  Current Medications: Current Outpatient Medications  Medication Sig Dispense Refill  . amphetamine-dextroamphetamine (ADDERALL XR) 30 MG 24 hr capsule 2 qam 60 capsule 0  . clindamycin (CLEOCIN) 300 MG capsule Take 1 capsule (300 mg total) by mouth 3 (three) times daily. 30 capsule 0  . clobetasol ointment (TEMOVATE) 0.05 % Apply 1 application topically 2 (two) times daily. Apply as directed twice daily 60 g 1  . diazepam (VALIUM) 2 MG tablet Take 1 tablet (2 mg total) by mouth daily as needed for anxiety. 30 tablet 0  . Diclofenac Sodium CR 100 MG 24 hr tablet TAKE 1 TABLET BY MOUTH TWICE DAILY. TAKE WITH FOOD. 60 tablet 6  .  Diphenhyd-Hydrocort-Nystatin (FIRST-DUKES MOUTHWASH) SUSP Take one tsp swish and spit qid 1 Bottle 0  . DULoxetine (CYMBALTA) 60 MG capsule Take 2 capsules (120 mg total) daily by mouth. 60 capsule 1  . lidocaine (XYLOCAINE) 2 % solution TAKE 5 TO 10 MLS BY MOUTH TWICE DAILY AS NEEDED. 100 mL 0  . methocarbamol (ROBAXIN) 750 MG tablet Take 750 mg by mouth 2 (two) times daily as needed for muscle spasms.    Marland Kitchen. MOVANTIK 25 MG TABS tablet Take 1 tablet by mouth daily.  11  .  nystatin (MYCOSTATIN/NYSTOP) powder APPLY TO AFFECTED AREA(S) TOPICALLY TWICE DAILY. 45 g 0  . omeprazole (PRILOSEC) 40 MG capsule TAKE (1) CAPSULE BY MOUTH ONCE DAILY. 30 capsule 2  . ondansetron (ZOFRAN ODT) 8 MG disintegrating tablet Take 1 tablet (8 mg total) by mouth every 8 (eight) hours as needed for nausea or vomiting. 20 tablet 2  . oxyCODONE-acetaminophen (PERCOCET) 10-325 MG tablet Take 1 tablet by mouth every 6 (six) hours as needed for pain. 120 tablet 0  . OXYCONTIN 15 MG 12 hr tablet Take 1 tablet by mouth 2 (two) times daily.  0  . traZODone (DESYREL) 100 MG tablet Take 2 tablets (200 mg total) at bedtime by mouth. 60 tablet 1  . triamcinolone (KENALOG) 0.1 % paste APPLY TO THE AFFECTED AREA ON THE TONGUE TWICE DAILY AS NEEDED. 5 g 0   No current facility-administered medications for this visit.      Musculoskeletal: Strength & Muscle Tone: within normal limits Gait & Station: normal Patient leans: N/A  Psychiatric Specialty Exam: Review of Systems  Psychiatric/Behavioral: Negative for depression, hallucinations, substance abuse and suicidal ideas. The patient is nervous/anxious. The patient does not have insomnia.   All other systems reviewed and are negative.   Blood pressure 140/90, pulse 81, height 5\' 4"  (1.626 m), weight 254 lb (115.2 kg), last menstrual period 03/18/2004, SpO2 99 %.Body mass index is 43.6 kg/m.  General Appearance: Fairly Groomed  Eye Contact:  Good  Speech:  Clear and Coherent   Volume:  Normal  Mood:  Anxious  Affect:  Appropriate, Congruent, Tearful and slightly down  Thought Process:  Coherent and Goal Directed  Orientation:  Full (Time, Place, and Person)  Thought Content: Logical Perceptions: denies AH/VH  Suicidal Thoughts:  No  Homicidal Thoughts:  No  Memory:  Immediate;   Good Recent;   Good Remote;   Good  Judgement:  Good  Insight:  Fair  Psychomotor Activity:  Normal  Concentration:  Concentration: Good and Attention Span: Good  Recall:  Good  Fund of Knowledge: Good  Language: Good  Akathisia:  No  Handed:  Right  AIMS (if indicated): not done  Assets:  Communication Skills Desire for Improvement  ADL's:  Intact  Cognition: WNL  Sleep:  Good on trazodone   Screenings:   Assessment and Plan:  TIMEKA GOETTE is a 51 y.o. year old female with a history of depression, ADHD per chart, anxiety,  hyperhydrosis, chronic pain, who presents for follow up appointment for Moderate episode of recurrent major depressive disorder (HCC)  # MDD, moderate, recurrent without psychotic features Patient reports overall improvement in her neurovegetative symptoms despite ongoing chronic pain and being unable to see pain provider. She also talks about loss of her son and another son who has not contacted her since last year. Will continue duloxetine to target depression and pain. Will continue trazodone prn for insomnia. Will continue valium prn for anxiety. Validated her loss and demoralization from pain. Discussed behavioral activation.   # ADHD She was diagnosed with ADHD and has been on Adderall. Will monitor side effect.   Plan 1 Continue duloxetine 120 mg daily  2. Continue Trazodone 200 mg at night as needed for sleep 3. Continue valium 2 mg daily as needed for anxiety (she declines refill at this time) 4. Return to clinic in two months for 30 mins (Patient is on Adderall XR 30 mg daily prescribed by her PCP)  The patient demonstrates the  following risk factors for  suicide: Chronic risk factors for suicide include: psychiatric disorder of depressionand chronic pain. Acute risk factorsfor suicide include: loss (financial, interpersonal, professional). Protective factorsfor this patient include: responsibility to others (children, family), coping skills and hope for the future. Considering these factors, the overall suicide risk at this point appears to be low. Patient isappropriate for outpatient follow up.  The duration of this appointment visit was 30 minutes of face-to-face time with the patient.  Greater than 50% of this time was spent in counseling, explanation of  diagnosis, planning of further management, and coordination of care.  [name] Adhrit Krenz, MD 01/28/2017, 2:08 PM

## 2017-01-28 ENCOUNTER — Encounter (HOSPITAL_COMMUNITY): Payer: Self-pay | Admitting: Psychiatry

## 2017-01-28 ENCOUNTER — Ambulatory Visit (HOSPITAL_COMMUNITY): Payer: BLUE CROSS/BLUE SHIELD | Admitting: Psychiatry

## 2017-01-28 VITALS — BP 140/90 | HR 81 | Ht 64.0 in | Wt 254.0 lb

## 2017-01-28 DIAGNOSIS — Z818 Family history of other mental and behavioral disorders: Secondary | ICD-10-CM | POA: Diagnosis not present

## 2017-01-28 DIAGNOSIS — R61 Generalized hyperhidrosis: Secondary | ICD-10-CM

## 2017-01-28 DIAGNOSIS — F331 Major depressive disorder, recurrent, moderate: Secondary | ICD-10-CM | POA: Diagnosis not present

## 2017-01-28 DIAGNOSIS — Z87891 Personal history of nicotine dependence: Secondary | ICD-10-CM

## 2017-01-28 DIAGNOSIS — F419 Anxiety disorder, unspecified: Secondary | ICD-10-CM

## 2017-01-28 DIAGNOSIS — R450 Nervousness: Secondary | ICD-10-CM | POA: Diagnosis not present

## 2017-01-28 DIAGNOSIS — F1099 Alcohol use, unspecified with unspecified alcohol-induced disorder: Secondary | ICD-10-CM

## 2017-01-28 DIAGNOSIS — F909 Attention-deficit hyperactivity disorder, unspecified type: Secondary | ICD-10-CM

## 2017-01-28 DIAGNOSIS — G8929 Other chronic pain: Secondary | ICD-10-CM | POA: Diagnosis not present

## 2017-01-28 MED ORDER — TRAZODONE HCL 100 MG PO TABS: 200.0000 mg | ORAL_TABLET | Freq: Every day | ORAL | 1 refills | Status: DC

## 2017-01-28 MED ORDER — DULOXETINE HCL 60 MG PO CPEP: 120.0000 mg | ORAL_CAPSULE | Freq: Every day | ORAL | 1 refills | Status: DC

## 2017-01-28 NOTE — Patient Instructions (Signed)
1 Continue duloxetine 120 mg daily  2. Continue Trazodone 200 mg at night as needed for sleep 3. Continue valium 2 mg daily as needed for anxiety  4. Return to clinic in two monhhs for 30 mins

## 2017-01-31 ENCOUNTER — Encounter: Payer: Self-pay | Admitting: Family Medicine

## 2017-02-03 ENCOUNTER — Telehealth: Payer: Self-pay | Admitting: Family Medicine

## 2017-02-03 NOTE — Telephone Encounter (Signed)
[facility] see note [facility]    ----- Message from Jeralene PetersShannon R Crews, LPN sent at [id number]/16/2018 1:04 PM EST ----- ----- Message from Melburn HakeSullivan, Jalesa G to Babs SciaraLuking, Scott A, MD sent at 01/31/2017 12:24 PM -----  Dr. Lorin PicketScott, Could Stevenson Clinchyouhave Brendale contact me about my pain management.  I'd appreciate it so much.

## 2017-02-11 ENCOUNTER — Encounter: Payer: Self-pay | Admitting: Orthopedic Surgery

## 2017-02-11 DIAGNOSIS — M2011 Hallux valgus (acquired), right foot: Secondary | ICD-10-CM | POA: Diagnosis not present

## 2017-02-11 NOTE — Telephone Encounter (Signed)
Pt states that Dr. Laurian Brim'Toole won't see her anymore due to failing her drug test  Need to refer somewhere else  [facility]

## 2017-02-20 ENCOUNTER — Inpatient Hospital Stay (INDEPENDENT_AMBULATORY_CARE_PROVIDER_SITE_OTHER): Payer: BLUE CROSS/BLUE SHIELD | Admitting: Family

## 2017-02-26 ENCOUNTER — Ambulatory Visit (INDEPENDENT_AMBULATORY_CARE_PROVIDER_SITE_OTHER): Payer: BLUE CROSS/BLUE SHIELD | Admitting: Orthopedic Surgery

## 2017-02-26 ENCOUNTER — Encounter (INDEPENDENT_AMBULATORY_CARE_PROVIDER_SITE_OTHER): Payer: Self-pay | Admitting: Family

## 2017-02-26 VITALS — Ht 64.0 in | Wt 254.0 lb

## 2017-02-26 DIAGNOSIS — M21611 Bunion of right foot: Secondary | ICD-10-CM

## 2017-02-26 NOTE — Progress Notes (Signed)
Office Visit Note   Patient: Lauren Greene           Date of Birth: 01/17/1966           MRN: [id number] Visit Date: 02/26/2017              Requested by: Babs SciaraLuking, Scott A, MD [address] [place], [place] [id number] PCP: Babs SciaraLuking, Scott A, MD  Chief Complaint  Patient presents with  . Right Foot - Routine Post Op    Chevron osteotomy, possible aiken on 02/11/17      HPI: Patient is a [age] year old woman who presents little over 2 weeks status post right chevron and Akin osteotomy.  Assessment & Plan: Visit Diagnoses:  1. Bunion of great toe of right foot     Plan: Patient was given instruction and demonstrated heel cord stretching to do 5 times a day a minute each time both lower extremities.  She will start scar massage for the incisions.  She will continue protected weightbearing with her kneeling scooter.  Patient was given a spacer to place in the first webspace and she was given 2 extra.  Follow-Up Instructions: Return in about 3 weeks (around 03/19/2017).   Ortho Exam  Patient is alert, oriented, no adenopathy, well-dressed, normal affect, normal respiratory effort. Examination the incision is well-healed the sutures are harvested the pins are removed.  Patient has no redness no cellulitis no signs of infection patient complains of pain over her toes she has significant heel cord contraction left with dorsiflexion about 20 degrees short of neutral and dorsiflexion about 10 degrees short of neutral with her knees extended.  The importance of heel cord stretching was discussed and demonstrated.  Imaging: No results found. No images are attached to the encounter.  Labs: Lab Results  Component Value Date   HGBA1C 5.6 07/22/2014   HGBA1C 5.2 02/16/2013   HGBA1C  07/24/2010    5.5 (NOTE)                                                                       According to the ADA Clinical Practice Recommendations for 2011, when HbA1c is used as a screening test:    >=6.5%   Diagnostic of Diabetes Mellitus           (if abnormal result  is confirmed)  5.7-6.4%   Increased risk of developing Diabetes Mellitus  References:Diagnosis and Classification of Diabetes Mellitus,Diabetes Care,2011,34(Suppl 1):S62-S69 and Standards of Medical Care in         Diabetes - 2011,Diabetes Care,2011,34  (Suppl 1):S11-S61.   ESRSEDRATE 2 08/03/2014   ESRSEDRATE 5 06/24/2013   ESRSEDRATE 1 02/16/2013   CRP 1.7 08/03/2014   REPTSTATUS 08/28/2012 FINAL 08/27/2012   CULT NO GROWTH 08/27/2012   LABORGA NO GROWTH 05/16/2014    @LABSALLVALUES (HGBA1)@  Body mass index is 43.6 kg/m.  Orders:  No orders of the defined types were placed in this encounter.  No orders of the defined types were placed in this encounter.    Procedures: No procedures performed  Clinical Data: No additional findings.  ROS:  All other systems negative, except as noted in the HPI. Review of Systems  Objective: Vital Signs: Ht 5\' 4"  (1.626 m)  Wt 254 lb (115.2 kg)   LMP 03/18/2004   BMI 43.60 kg/m   Specialty Comments:  No specialty comments available.  PMFS History: Patient Active Problem List   Diagnosis Date Noted  . 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 Moderate episode of recurrent major depressive disorder (HCC) 02/01/2016  . 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 Osteoarthritis 08/16/2015  . Morbid obesity (HCC) 08/07/2015  . Elevated blood pressure 01/31/2014  . Arthritis 06/24/2013  . Myalgia and myositis 06/24/2013  . Chronic back pain 02/16/2013  . Depression, major, in remission (HCC) 01/05/2013  . Chronic arthralgias of knees and hips 01/05/2013  . 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 ADD (attention deficit disorder) 07/08/2012   Past Medical History:  Diagnosis Date  . ADHD (attention deficit hyperactivity disorder)   . Arthritis   . Back pain   . Broken femur (HCC) 08/27/12  . Migraine   .  Osteoarthritis   . Thyroid disease    hypothyroidism    Family History  Problem Relation Age of Onset  . Cancer Mother   . Breast cancer Mother   . Bipolar disorder Mother   . Heart failure Father   . Cancer Father   . Cancer Other   . Heart failure Other   . Bipolar disorder Sister     Past Surgical History:  Procedure Laterality Date  . BACK SURGERY    . CERVICAL BIOPSY  W/ LOOP ELECTRODE EXCISION  1990's   [place]  . ENDOMETRIAL BIOPSY  04-05-05 and 02-12-06   04-05-05 benign but too rapid growth. RXd Provera and repeat Bx 11mo. 02-12-06 Bx benign--Dr. Leda QuailSuzanne Miller  . FEMUR IM NAIL Left 08/27/2012   Procedure: INTRAMEDULLARY (IM) RETROGRADE FEMORAL NAILING;  Surgeon: Kathryne Hitchhristopher Y Blackman, MD;  Location: [facility] OR;  Service: Orthopedics;  Laterality: Left;  . HEEL SPUR SURGERY    . IM NAILING FEMORAL SHAFT FRACTURE Left 08/27/2012   Dr Magnus IvanBLACKMAN  . TOTAL KNEE ARTHROPLASTY     x2  . TUBAL LIGATION     Social History   Occupational History  . Not on file  Tobacco Use  . Smoking status: Former Smoker    Years: 10.00    Types: Cigarettes    Last attempt to quit: 08/16/2000    Years since quitting: 16.5  . Smokeless tobacco: Never Used  Substance and Sexual Activity  . Alcohol use: Yes    Alcohol/week: 0.0 oz    Comment: rarely  . Drug use: No  . Sexual activity: Not Currently    Partners: Female

## 2017-03-01 ENCOUNTER — Encounter (INDEPENDENT_AMBULATORY_CARE_PROVIDER_SITE_OTHER): Payer: Self-pay | Admitting: Orthopedic Surgery

## 2017-03-03 ENCOUNTER — Telehealth (INDEPENDENT_AMBULATORY_CARE_PROVIDER_SITE_OTHER): Payer: Self-pay | Admitting: Radiology

## 2017-03-03 NOTE — Telephone Encounter (Signed)
I called and spoke with patient over the phone, encouraged her to come in the office today. She does not feel like she can come in today. She feels she is pretty sure it is infected. But cannot still come in today, taking bactrim. She wants to call us back tomorrow.

## 2017-03-15 ENCOUNTER — Other Ambulatory Visit: Payer: Self-pay | Admitting: Family Medicine

## 2017-03-16 LAB — BASIC METABOLIC PANEL
BUN/Creatinine Ratio: 31 — ABNORMAL HIGH (ref 9–23)
BUN: 22 mg/dL (ref 6–24)
CO2: 23 mmol/L (ref 20–29)
Calcium: 9.4 mg/dL (ref 8.7–10.2)
Chloride: 101 mmol/L (ref 96–106)
Creatinine, Ser: 0.71 mg/dL (ref 0.57–1.00)
GFR calc Af Amer: 113 mL/min/{1.73_m2} (ref >59)
GFR calc non Af Amer: 98 mL/min/{1.73_m2} (ref >59)
Glucose: 86 mg/dL (ref 65–99)
Potassium: 5.4 mmol/L — ABNORMAL HIGH (ref 3.5–5.2)
Sodium: 139 mmol/L (ref 134–144)

## 2017-03-16 LAB — LIPID PANEL
Chol/HDL Ratio: 3.5 ratio (ref 0.0–4.4)
Cholesterol, Total: 195 mg/dL (ref 100–199)
HDL: 55 mg/dL (ref >39)
LDL Calculated: 116 mg/dL — ABNORMAL HIGH (ref 0–99)
Triglycerides: 119 mg/dL (ref 0–149)
VLDL Cholesterol Cal: 24 mg/dL (ref 5–40)

## 2017-03-16 LAB — HEPATIC FUNCTION PANEL
ALT: 26 IU/L (ref 0–32)
AST: 26 IU/L (ref 0–40)
Albumin: 4.3 g/dL (ref 3.5–5.5)
Alkaline Phosphatase: 117 IU/L (ref 39–117)
Bilirubin Total: 0.4 mg/dL (ref 0.0–1.2)
Bilirubin, Direct: 0.1 mg/dL (ref 0.00–0.40)
Total Protein: 7 g/dL (ref 6.0–8.5)

## 2017-03-17 ENCOUNTER — Other Ambulatory Visit: Payer: Self-pay | Admitting: Family Medicine

## 2017-03-19 ENCOUNTER — Ambulatory Visit (INDEPENDENT_AMBULATORY_CARE_PROVIDER_SITE_OTHER): Payer: BLUE CROSS/BLUE SHIELD | Admitting: Orthopedic Surgery

## 2017-03-19 NOTE — Addendum Note (Signed)
Addended by: Meredith LeedsSUTTON, Anita Laguna L on: 03/19/2017 03:52 PM   Modules accepted: Orders

## 2017-03-24 ENCOUNTER — Encounter (INDEPENDENT_AMBULATORY_CARE_PROVIDER_SITE_OTHER): Payer: Self-pay | Admitting: Family

## 2017-03-24 ENCOUNTER — Ambulatory Visit (INDEPENDENT_AMBULATORY_CARE_PROVIDER_SITE_OTHER): Payer: PPO | Admitting: Family

## 2017-03-24 DIAGNOSIS — M21611 Bunion of right foot: Secondary | ICD-10-CM

## 2017-03-24 DIAGNOSIS — L97511 Non-pressure chronic ulcer of other part of right foot limited to breakdown of skin: Secondary | ICD-10-CM | POA: Insufficient documentation

## 2017-03-24 NOTE — Progress Notes (Deleted)
BH MD/PA/NP OP Progress Note  03/24/2017 12:20 PM Lauren Greene  MRN:  [id number]  Chief Complaint:  HPI: *** Visit Diagnosis: No diagnosis found.  Past Psychiatric History:  I have reviewed the patient's psychiatry history in detail and updated the patient record. Outpatient: [facility]; started on Adderall since then. Psychiatry admission: denies Previous suicide attempt: denies Past trials of medication: Celexa, Wellbutrin ("mean"), Prozac, Effexor, Mirtazapine, duloxetine, nortriptyline (dry mouth), Adderall (4 years), gabapentin, Lyrica (swelling), lorazepam, valium    Past Medical History:  Past Medical History:  Diagnosis Date  . ADHD (attention deficit hyperactivity disorder)   . Arthritis   . Back pain   . Broken femur (HCC) 08/27/12  . Migraine   . Osteoarthritis   . Thyroid disease    hypothyroidism    Past Surgical History:  Procedure Laterality Date  . BACK SURGERY    . CERVICAL BIOPSY  W/ LOOP ELECTRODE EXCISION  1990's   in [place]  . ENDOMETRIAL BIOPSY  04-05-05 and 02-12-06   04-05-05 benign but too rapid growth. RXd Provera and repeat Bx 61mo. 02-12-06 Bx benign--Dr. Leda QuailSuzanne Miller  . FEMUR IM NAIL Left 08/27/2012   Procedure: INTRAMEDULLARY (IM) RETROGRADE FEMORAL NAILING;  Surgeon: Kathryne Hitchhristopher Y Blackman, MD;  Location: [facility] OR;  Service: Orthopedics;  Laterality: Left;  . HEEL SPUR SURGERY    . IM NAILING FEMORAL SHAFT FRACTURE Left 08/27/2012   Dr Magnus IvanBLACKMAN  . TOTAL KNEE ARTHROPLASTY     x2  . TUBAL LIGATION      Family Psychiatric History:  I have reviewed the patient's family history in detail and updated the patient record.  Family History:  Family History  Problem Relation Age of Onset  . Cancer Mother   . Breast cancer Mother   . Bipolar disorder Mother   . Heart failure Father   . Cancer Father   . Cancer Other   . Heart failure Other   . Bipolar disorder Sister     Social History:   Social History   Socioeconomic History  . Marital status: Single    Spouse name: Not on file  . Number of children: Not on file  . Years of education: Not on file  . Highest education level: Not on file  Social Needs  . Financial resource strain: Not on file  . Food insecurity - worry: Not on file  . Food insecurity - inability: Not on file  . Transportation needs - medical: Not on file  . Transportation needs - non-medical: Not on file  Occupational History  . Not on file  Tobacco Use  . Smoking status: Former Smoker    Years: 10.00    Types: Cigarettes    Last attempt to quit: 08/16/2000    Years since quitting: 16.6  . Smokeless tobacco: Never Used  Substance and Sexual Activity  . Alcohol use: Yes    Alcohol/week: 0.0 oz    Comment: rarely  . Drug use: No  . Sexual activity: Not Currently    Partners: Female  Other Topics Concern  . Not on file  Social History Narrative  . Not on file    Allergies:  Allergies  Allergen Reactions  . Wellbutrin [Bupropion]     Anger issues  . Lyrica [Pregabalin] Swelling    Leg swelling    Metabolic Disorder Labs: Lab Results  Component Value Date   HGBA1C 5.6 07/22/2014   MPG 103 02/16/2013  MPG 111 07/24/2010   No results found for: PROLACTIN Lab Results  Component Value Date   CHOL 195 03/15/2017   TRIG 119 03/15/2017   HDL 55 03/15/2017   CHOLHDL 3.5 03/15/2017   VLDL 13 07/10/2012   LDLCALC 116 (H) 03/15/2017   LDLCALC 108 (H) 01/12/2016   Lab Results  Component Value Date   TSH 4.104 06/24/2013   TSH 4.142 01/07/2013    Therapeutic Level Labs: No results found for: LITHIUM No results found for: VALPROATE No components found for:  CBMZ  Current Medications: Current Outpatient Medications  Medication Sig Dispense Refill  . amphetamine-dextroamphetamine (ADDERALL XR) 30 MG 24 hr capsule 2 qam 60 capsule 0  . clindamycin (CLEOCIN) 300 MG capsule Take 1 capsule (300 mg total) by mouth 3 (three) times  daily. 30 capsule 0  . clobetasol ointment (TEMOVATE) 0.05 % Apply 1 application topically 2 (two) times daily. Apply as directed twice daily 60 g 1  . diazepam (VALIUM) 2 MG tablet Take 1 tablet (2 mg total) by mouth daily as needed for anxiety. 30 tablet 0  . Diclofenac Sodium CR 100 MG 24 hr tablet TAKE 1 TABLET BY MOUTH TWICE DAILY. TAKE WITH FOOD. 60 tablet 6  . Diphenhyd-Hydrocort-Nystatin (FIRST-DUKES MOUTHWASH) SUSP Take one tsp swish and spit qid 1 Bottle 0  . DULoxetine (CYMBALTA) 60 MG capsule Take 2 capsules (120 mg total) daily by mouth. 60 capsule 1  . lidocaine (XYLOCAINE) 2 % solution TAKE 5 TO 10 MLS BY MOUTH TWICE DAILY AS NEEDED. 100 mL 0  . methocarbamol (ROBAXIN) 750 MG tablet Take 750 mg by mouth 2 (two) times daily as needed for muscle spasms.    Marland Kitchen MOVANTIK 25 MG TABS tablet Take 1 tablet by mouth daily.  11  . nystatin (MYCOSTATIN/NYSTOP) powder APPLY TO AFFECTED AREA(S) TOPICALLY TWICE DAILY. 45 g 0  . omeprazole (PRILOSEC) 40 MG capsule TAKE (1) CAPSULE BY MOUTH ONCE DAILY. 30 capsule 2  . ondansetron (ZOFRAN ODT) 8 MG disintegrating tablet Take 1 tablet (8 mg total) by mouth every 8 (eight) hours as needed for nausea or vomiting. 20 tablet 2  . oxyCODONE-acetaminophen (PERCOCET) 10-325 MG tablet Take 1 tablet by mouth every 6 (six) hours as needed for pain. 120 tablet 0  . OXYCONTIN 15 MG 12 hr tablet Take 1 tablet by mouth 2 (two) times daily.  0  . traZODone (DESYREL) 100 MG tablet Take 2 tablets (200 mg total) at bedtime by mouth. 60 tablet 1  . triamcinolone (KENALOG) 0.1 % paste APPLY TO THE AFFECTED AREA ON THE TONGUE TWICE DAILY AS NEEDED. 5 g 0   No current facility-administered medications for this visit.      Musculoskeletal: Strength & Muscle Tone: within normal limits Gait & Station: normal Patient leans: N/A  Psychiatric Specialty Exam: ROS  Last menstrual period 03/18/2004.There is no height or weight on file to calculate BMI.  General Appearance:  Fairly Groomed  Eye Contact:  Good  Speech:  Clear and Coherent  Volume:  Normal  Mood:  {BHH MOOD:22306}  Affect:  {Affect (PAA):22687}  Thought Process:  Coherent and Goal Directed  Orientation:  Full (Time, Place, and Person)  Thought Content: Logical   Suicidal Thoughts:  {ST/HT (PAA):22692}  Homicidal Thoughts:  {ST/HT (PAA):22692}  Memory:  Immediate;   Good Recent;   Good Remote;   Good  Judgement:  {Judgement (PAA):22694}  Insight:  {Insight (PAA):22695}  Psychomotor Activity:  Normal  Concentration:  Concentration: Good and  Attention Span: Good  Recall:  Good  Fund of Knowledge: Good  Language: Good  Akathisia:  No  Handed:  Right  AIMS (if indicated): not done  Assets:  Communication Skills Desire for Improvement  ADL's:  Intact  Cognition: WNL  Sleep:  {BHH GOOD/FAIR/POOR:22877}   Screenings:   Assessment and Plan:  Lauren Greene is a 52 y.o. year old female with a history of depression, ADHD, anxiety, hyperhydrosis, chronic pain, who presents for follow up appointment for No diagnosis found.  # MDD, moderate, recurrent without psychotic features  Patient reports overall improvement in her neurovegetative symptoms despite ongoing chronic pain and being unable to see pain provider. She also talks about loss of her son and another son who has not contacted her since last year. Will continue duloxetine to target depression and pain. Will continue trazodone prn for insomnia. Will continue valium prn for anxiety. Validated her loss and demoralization from pain. Discussed behavioral activation.   # ADHD She was diagnosed with ADHD and has been on Adderall. Will monitor side effect.   Plan 1 Continue duloxetine 120 mg daily  2. Continue Trazodone 200 mg at night as needed for sleep 3. Continue valium 2 mg daily as needed for anxiety (she declines refill at this time) 4. Return to clinic in two months for 30 mins (Patient is on Adderall XR 30 mg daily  prescribed by her PCP)  The patient demonstrates the following risk factors for suicide: Chronic risk factors for suicide include: psychiatric disorder of depressionand chronic pain. Acute risk factorsfor suicide include: loss (financial, interpersonal, professional). Protective factorsfor this patient include: responsibility to others (children, family), coping skills and hope for the future. Considering these factors, the overall suicide risk at this point appears to be low. Patient isappropriate for outpatient follow up.    Neysa Hotter, MD 03/24/2017, 12:20 PM

## 2017-03-24 NOTE — Progress Notes (Signed)
Office Visit Note   Patient: Lauren Greene           Date of Birth: 1966-01-13           MRN: [id number] Visit Date: 03/24/2017              Requested by: Babs Sciara, MD [address] [place], [place] [address] PCP: Babs Sciara, MD  No chief complaint on file.     HPI: Patient is a 52 year old woman who presents about 5 weeks status post right chevron and Akin osteotomy. In regular shoe wear today. States is not worried about infection today. Just wants to know when she'll be able to fit in all her regular shoes. Can only wear clogs and NB walking sneakers now.   Assessment & Plan: Visit Diagnoses:  1. Bunion, right     Plan: Patient was given instruction and demonstrated heel cord stretching to do 5 times a day a minute each time both lower extremities.  She will start scar massage for the incisions.  May continue spacers, has purchased more. Is wearing today.   Follow-Up Instructions: Return if symptoms worsen or fail to improve.   Ortho Exam  Patient is alert, oriented, no adenopathy, well-dressed, normal affect, normal respiratory effort. Examination the incision is well-healed. Mild swelling. Patient has no redness no cellulitis no signs of infection. Mild tenderness over MTP.   Imaging: No results found. No images are attached to the encounter.  Labs: Lab Results  Component Value Date   HGBA1C 5.6 07/22/2014   HGBA1C 5.2 02/16/2013   HGBA1C  07/24/2010    5.5 (NOTE)                                                                       According to the ADA Clinical Practice Recommendations for 2011, when HbA1c is used as a screening test:   >=6.5%   Diagnostic of Diabetes Mellitus           (if abnormal result  is confirmed)  5.7-6.4%   Increased risk of developing Diabetes Mellitus  References:Diagnosis and Classification of Diabetes Mellitus,Diabetes Care,2011,34(Suppl 1):S62-S69 and Standards of Medical Care in         Diabetes - 2011,Diabetes  Care,2011,34  (Suppl 1):S11-S61.   ESRSEDRATE 2 08/03/2014   ESRSEDRATE 5 06/24/2013   ESRSEDRATE 1 02/16/2013   CRP 1.7 08/03/2014   REPTSTATUS 08/28/2012 FINAL 08/27/2012   CULT NO GROWTH 08/27/2012   LABORGA NO GROWTH 05/16/2014    @LABSALLVALUES (HGBA1)@  There is no height or weight on file to calculate BMI.  Orders:  No orders of the defined types were placed in this encounter.  No orders of the defined types were placed in this encounter.    Procedures: No procedures performed  Clinical Data: No additional findings.  ROS:  All other systems negative, except as noted in the HPI. Review of Systems  Constitutional: Negative for chills and fever.  Musculoskeletal: Positive for arthralgias.  Skin: Negative for color change and wound.  Neurological: Negative for weakness and numbness.    Objective: Vital Signs: LMP 03/18/2004   Specialty Comments:  No specialty comments available.  PMFS History: Patient Active Problem List   Diagnosis Date  Noted  . Bunion, right 03/24/2017  . 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 Moderate episode of recurrent major depressive disorder (HCC) 02/01/2016  . 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 Osteoarthritis 08/16/2015  . Morbid obesity (HCC) 08/07/2015  . Elevated blood pressure 01/31/2014  . Arthritis 06/24/2013  . Myalgia and myositis 06/24/2013  . Chronic back pain 02/16/2013  . Depression, major, in remission (HCC) 01/05/2013  . Chronic arthralgias of knees and hips 01/05/2013  . 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 ADD (attention deficit disorder) 07/08/2012   Past Medical History:  Diagnosis Date  . ADHD (attention deficit hyperactivity disorder)   . Arthritis   . Back pain   . Broken femur (HCC) 08/27/12  . Migraine   . Osteoarthritis   . Thyroid disease    hypothyroidism    Family History  Problem Relation Age of Onset  . Cancer Mother   . Breast  cancer Mother   . Bipolar disorder Mother   . Heart failure Father   . Cancer Father   . Cancer Other   . Heart failure Other   . Bipolar disorder Sister     Past Surgical History:  Procedure Laterality Date  . BACK SURGERY    . CERVICAL BIOPSY  W/ LOOP ELECTRODE EXCISION  1990's   in [place], [place]  . ENDOMETRIAL BIOPSY  04-05-05 and 02-12-06   04-05-05 benign but too rapid growth. RXd Provera and repeat Bx 75mo. 02-12-06 Bx benign--Dr. Leda QuailSuzanne Miller  . FEMUR IM NAIL Left 08/27/2012   Procedure: INTRAMEDULLARY (IM) RETROGRADE FEMORAL NAILING;  Surgeon: Kathryne Hitchhristopher Y Blackman, MD;  Location: [facility] OR;  Service: Orthopedics;  Laterality: Left;  . HEEL SPUR SURGERY    . IM NAILING FEMORAL SHAFT FRACTURE Left 08/27/2012   Dr Magnus IvanBLACKMAN  . TOTAL KNEE ARTHROPLASTY     x2  . TUBAL LIGATION     Social History   Occupational History  . Not on file  Tobacco Use  . Smoking status: Former Smoker    Years: 10.00    Types: Cigarettes    Last attempt to quit: 08/16/2000    Years since quitting: 16.6  . Smokeless tobacco: Never Used  Substance and Sexual Activity  . Alcohol use: Yes    Alcohol/week: 0.0 oz    Comment: rarely  . Drug use: No  . Sexual activity: Not Currently    Partners: Female

## 2017-03-25 ENCOUNTER — Encounter: Payer: Self-pay | Admitting: Family Medicine

## 2017-03-26 ENCOUNTER — Encounter: Payer: BLUE CROSS/BLUE SHIELD | Admitting: Family Medicine

## 2017-03-27 ENCOUNTER — Ambulatory Visit (HOSPITAL_COMMUNITY): Payer: Self-pay | Admitting: Psychiatry

## 2017-04-04 NOTE — Progress Notes (Signed)
BH MD/PA/NP OP Progress Note  04/08/2017 4:01 PM Lauren Greene  MRN:  [id number]  Chief Complaint:  Chief Complaint    Follow-up; Depression     HPI:  Patient presents for follow-up appointment for depression.  She states that she has concern that what would happen at her upcoming appointment  at the pain clinic.  She feels frustrated that she is in the position as she was "too honest" to providers. She feels torn as she is a person who would be honest to herself. She report that she feels sad as her did not come for holiday, although she invited him. She states that she wanted her children to have more, although she is satisfied with her own life. She did not expect that it was her last interaction with him when she took away game from her son. She cannot believe that her son started to live with his father, although he rarely meets with his father growing up. She denies insomnia.  She feels fatigued at times.  She is more active, although she has not been able to do yard work as she does in summer.  She has good concentration.  She has good appetite.  She denies SI.  She feels anxious and tense at times.  She denies panic attacks.  She has had taken Valium a few times since the last appointment.   Wt Readings from Last 3 Encounters:  04/08/17 246 lb (111.6 kg)  02/26/17 254 lb (115.2 kg)  01/28/17 254 lb (115.2 kg)    Per PMP, On oxycodone, dextroamphetamine. Valium last filled on 12/04/2016 I have utilized the [place] Controlled Substances Reporting System (PMP AWARxE) to confirm adherence regarding the patient's medication. My review reveals appropriate prescription fills.   Visit Diagnosis:    ICD-10-CM   1. Moderate episode of recurrent major depressive disorder (HCC) F33.1     Past Psychiatric History:  I have reviewed the patient's psychiatry history in detail and updated the patient record. Outpatient: [facility]; started on Adderall since  then. Psychiatry admission: denies Previous suicide attempt: denies Past trials of medication: Celexa, Wellbutrin ("mean"), Prozac, Effexor, Mirtazapine, duloxetine, nortriptyline (dry mouth), Adderall (4 years), gabapentin, Lyrica (swelling), lorazepam, valium    Past Medical History:  Past Medical History:  Diagnosis Date  . ADHD (attention deficit hyperactivity disorder)   . Arthritis   . Back pain   . Broken femur (HCC) 08/27/12  . Migraine   . Osteoarthritis   . Thyroid disease    hypothyroidism    Past Surgical History:  Procedure Laterality Date  . BACK SURGERY    . CERVICAL BIOPSY  W/ LOOP ELECTRODE EXCISION  1990's   in [place]  . ENDOMETRIAL BIOPSY  04-05-05 and 02-12-06   04-05-05 benign but too rapid growth. RXd Provera and repeat Bx 35mo. 02-12-06 Bx benign--Dr. Leda QuailSuzanne Miller  . FEMUR IM NAIL Left 08/27/2012   Procedure: INTRAMEDULLARY (IM) RETROGRADE FEMORAL NAILING;  Surgeon: Kathryne Hitchhristopher Y Blackman, MD;  Location: [facility] OR;  Service: Orthopedics;  Laterality: Left;  . HEEL SPUR SURGERY    . IM NAILING FEMORAL SHAFT FRACTURE Left 08/27/2012   Dr Magnus IvanBLACKMAN  . TOTAL KNEE ARTHROPLASTY     x2  . TUBAL LIGATION      Family Psychiatric History: I have reviewed the patient's family history in detail and updated the patient record.  Family History:  Family History  Problem Relation Age of Onset  . Cancer  Mother   . Breast cancer Mother   . Bipolar disorder Mother   . Heart failure Father   . Cancer Father   . Cancer Other   . Heart failure Other   . Bipolar disorder Sister     Social History:  Social History   Socioeconomic History  . Marital status: Single    Spouse name: None  . Number of children: None  . Years of education: None  . Highest education level: None  Social Needs  . Financial resource strain: None  . Food insecurity - worry: None  . Food insecurity - inability: None  . Transportation needs - medical: None  . Transportation needs -  non-medical: None  Occupational History  . None  Tobacco Use  . Smoking status: Former Smoker    Years: 10.00    Types: Cigarettes    Last attempt to quit: 08/16/2000    Years since quitting: 16.6  . Smokeless tobacco: Never Used  Substance and Sexual Activity  . Alcohol use: Yes    Alcohol/week: 0.0 oz    Comment: rarely  . Drug use: No  . Sexual activity: Not Currently    Partners: Female  Other Topics Concern  . None  Social History Narrative  . None    Allergies:  Allergies  Allergen Reactions  . Wellbutrin [Bupropion]     Anger issues  . Lyrica [Pregabalin] Swelling    Leg swelling    Metabolic Disorder Labs: Lab Results  Component Value Date   HGBA1C 5.6 07/22/2014   MPG 103 02/16/2013   MPG 111 07/24/2010   No results found for: PROLACTIN Lab Results  Component Value Date   CHOL 195 03/15/2017   TRIG 119 03/15/2017   HDL 55 03/15/2017   CHOLHDL 3.5 03/15/2017   VLDL 13 07/10/2012   LDLCALC 116 (H) 03/15/2017   LDLCALC 108 (H) 01/12/2016   Lab Results  Component Value Date   TSH 4.104 06/24/2013   TSH 4.142 01/07/2013    Therapeutic Level Labs: No results found for: LITHIUM No results found for: VALPROATE No components found for:  CBMZ  Current Medications: Current Outpatient Medications  Medication Sig Dispense Refill  . amphetamine-dextroamphetamine (ADDERALL XR) 30 MG 24 hr capsule 2 qam 60 capsule 0  . clindamycin (CLEOCIN) 300 MG capsule Take 1 capsule (300 mg total) by mouth 3 (three) times daily. 30 capsule 0  . clobetasol ointment (TEMOVATE) 0.05 % Apply 1 application topically 2 (two) times daily. Apply as directed twice daily 60 g 1  . diazepam (VALIUM) 2 MG tablet Take 1 tablet (2 mg total) by mouth daily as needed for anxiety. 30 tablet 0  . Diclofenac Sodium CR 100 MG 24 hr tablet TAKE 1 TABLET BY MOUTH TWICE DAILY. TAKE WITH FOOD. 60 tablet 6  . Diphenhyd-Hydrocort-Nystatin (FIRST-DUKES MOUTHWASH) SUSP Take one tsp swish and spit  qid 1 Bottle 0  . DULoxetine (CYMBALTA) 60 MG capsule Take 2 capsules (120 mg total) by mouth daily. 180 capsule 0  . lidocaine (XYLOCAINE) 2 % solution TAKE 5 TO 10 MLS BY MOUTH TWICE DAILY AS NEEDED. 100 mL 0  . methocarbamol (ROBAXIN) 750 MG tablet Take 750 mg by mouth 2 (two) times daily as needed for muscle spasms.    Marland Kitchen MOVANTIK 25 MG TABS tablet Take 1 tablet by mouth daily.  11  . nystatin (MYCOSTATIN/NYSTOP) powder APPLY TO AFFECTED AREA(S) TOPICALLY TWICE DAILY. 45 g 0  . omeprazole (PRILOSEC) 40 MG capsule TAKE (1)  CAPSULE BY MOUTH ONCE DAILY. 30 capsule 2  . ondansetron (ZOFRAN ODT) 8 MG disintegrating tablet Take 1 tablet (8 mg total) by mouth every 8 (eight) hours as needed for nausea or vomiting. 20 tablet 2  . oxyCODONE-acetaminophen (PERCOCET) 10-325 MG tablet Take 1 tablet by mouth every 6 (six) hours as needed for pain. 120 tablet 0  . OXYCONTIN 15 MG 12 hr tablet Take 1 tablet by mouth 2 (two) times daily.  0  . traZODone (DESYREL) 100 MG tablet Take 2 tablets (200 mg total) by mouth at bedtime. 180 tablet 0  . triamcinolone (KENALOG) 0.1 % paste APPLY TO THE AFFECTED AREA ON THE TONGUE TWICE DAILY AS NEEDED. 5 g 0   No current facility-administered medications for this visit.      Musculoskeletal: Strength & Muscle Tone: within normal limits Gait & Station: normal Patient leans: N/A  Psychiatric Specialty Exam: Review of Systems  Musculoskeletal: Positive for joint pain.  Psychiatric/Behavioral: Positive for depression. Negative for hallucinations, memory loss, substance abuse and suicidal ideas. The patient is nervous/anxious. The patient does not have insomnia.     Blood pressure (!) 156/103, pulse 92, height 5\' 4"  (1.626 m), weight 246 lb (111.6 kg), last menstrual period 03/18/2004, SpO2 93 %.Body mass index is 42.23 kg/m.  General Appearance: Fairly Groomed  Eye Contact:  Good  Speech:  Clear and Coherent  Volume:  Normal  Mood:  "fine"  Affect:   Appropriate, Congruent, Tearful and down at time  Thought Process:  Coherent and Goal Directed  Orientation:  Full (Time, Place, and Person)  Thought Content: Logical   Suicidal Thoughts:  No  Homicidal Thoughts:  No  Memory:  Immediate;   Good Recent;   Good Remote;   Good  Judgement:  Good  Insight:  Fair  Psychomotor Activity:  Normal  Concentration:  Concentration: Good and Attention Span: Good  Recall:  Good  Fund of Knowledge: Good  Language: Good  Akathisia:  No  Handed:  Right  AIMS (if indicated): not done  Assets:  Communication Skills Desire for Improvement  ADL's:  Intact  Cognition: WNL  Sleep:  Good   Screenings:   Assessment and Plan:  Lauren Greene is a 52 y.o. year old female with a history of depression, ADHD per chart, anxiety, hyperhydrosis, chronic pain , who presents for follow up appointment for Moderate episode of recurrent major depressive disorder (HCC)  # MDD, moderate, recurrent without psychotic features Patient reports occasional episodes of depression in the setting of being unable to have pain provider and discordance with her son.  Will continue duloxetine to target neurovegetative symptoms and pain while monitoring hypertension.  Will consider adjunctive treatment in the future if any worsening in her neurovegetative symptoms.  Will continue trazodone as needed for insomnia.  Will continue Valium as needed for anxiety.  Discussed healthy boundary.  Validated her demoralization from pain.  Discussed behavioral activation.   # ADHD She was diagnosed with ADHD per report and has been on Adderall by her PCP.  Will continue to monitor any side effect.   Plan 1 Continue duloxetine 120 mg daily  2. Continue Trazodone 200 mg at night as needed for sleep 3. Continue valium 2 mg daily as needed for anxiety (she declines refill at this time) 4. Return to clinic in three months for 30 mins (Patient is on Adderall XR 30 mg daily prescribed by her  PCP)  The patient demonstrates the following risk factors for suicide:  Chronic risk factors for suicide include: psychiatric disorder of depressionand chronic pain. Acute risk factorsfor suicide include: loss (financial, interpersonal, professional). Protective factorsfor this patient include: responsibility to others (children, family), coping skills and hope for the future. Considering these factors, the overall suicide risk at this point appears to be low. Patient isappropriate for outpatient follow up.  The duration of this appointment visit was 30 minutes of face-to-face time with the patient.  Greater than 50% of this time was spent in counseling, explanation of  diagnosis, planning of further management, and coordination of care.  Neysa Hotter, MD 04/08/2017, 4:01 PM

## 2017-04-08 ENCOUNTER — Ambulatory Visit (INDEPENDENT_AMBULATORY_CARE_PROVIDER_SITE_OTHER): Payer: Self-pay | Admitting: Psychiatry

## 2017-04-08 ENCOUNTER — Encounter (HOSPITAL_COMMUNITY): Payer: Self-pay | Admitting: Psychiatry

## 2017-04-08 VITALS — BP 156/103 | HR 92 | Ht 64.0 in | Wt 246.0 lb

## 2017-04-08 DIAGNOSIS — R450 Nervousness: Secondary | ICD-10-CM

## 2017-04-08 DIAGNOSIS — F331 Major depressive disorder, recurrent, moderate: Secondary | ICD-10-CM

## 2017-04-08 DIAGNOSIS — F909 Attention-deficit hyperactivity disorder, unspecified type: Secondary | ICD-10-CM

## 2017-04-08 DIAGNOSIS — M2550 Pain in unspecified joint: Secondary | ICD-10-CM

## 2017-04-08 DIAGNOSIS — F419 Anxiety disorder, unspecified: Secondary | ICD-10-CM

## 2017-04-08 DIAGNOSIS — Z87891 Personal history of nicotine dependence: Secondary | ICD-10-CM

## 2017-04-08 DIAGNOSIS — Z818 Family history of other mental and behavioral disorders: Secondary | ICD-10-CM

## 2017-04-08 MED ORDER — TRAZODONE HCL 100 MG PO TABS: 200.0000 mg | ORAL_TABLET | Freq: Every day | ORAL | 0 refills | Status: DC

## 2017-04-08 MED ORDER — DULOXETINE HCL 60 MG PO CPEP: 120.0000 mg | ORAL_CAPSULE | Freq: Every day | ORAL | 0 refills | Status: DC

## 2017-04-08 NOTE — Patient Instructions (Addendum)
1 Continue duloxetine 120 mg daily  2. Continue Trazodone 200 mg at night as needed for sleep 3. Continue valium 2 mg daily as needed for anxiety 4. Return to clinic in three months for 30 mins

## 2017-04-14 ENCOUNTER — Ambulatory Visit (INDEPENDENT_AMBULATORY_CARE_PROVIDER_SITE_OTHER): Payer: PPO | Admitting: Obstetrics & Gynecology

## 2017-04-14 ENCOUNTER — Encounter: Payer: Self-pay | Admitting: Obstetrics & Gynecology

## 2017-04-14 VITALS — BP 160/96 | HR 80 | Resp 16 | Ht 66.5 in | Wt 249.0 lb

## 2017-04-14 DIAGNOSIS — N760 Acute vaginitis: Secondary | ICD-10-CM | POA: Diagnosis not present

## 2017-04-14 DIAGNOSIS — N898 Other specified noninflammatory disorders of vagina: Secondary | ICD-10-CM | POA: Diagnosis not present

## 2017-04-14 DIAGNOSIS — Z01419 Encounter for gynecological examination (general) (routine) without abnormal findings: Secondary | ICD-10-CM

## 2017-04-14 MED ORDER — MIRABEGRON ER 50 MG PO TB24: 50.0000 mg | ORAL_TABLET | Freq: Every day | ORAL | 1 refills | Status: DC

## 2017-04-14 MED ORDER — CLOBETASOL PROPIONATE 0.05 % EX OINT: 1.0000 "application " | TOPICAL_OINTMENT | Freq: Two times a day (BID) | CUTANEOUS | 1 refills | Status: DC

## 2017-04-14 NOTE — Progress Notes (Signed)
52 y.o. [id number] SingleCaucasianF here for annual exam.  Had bunionectomy 02/11/17.  Still having swelling and pain in right foot.  Can't wear any of her shoes.  Was released after six weeks.  Was told she could walk on it and had no restrictions.  Is still having swelling.  Is considering seeing her podiatrist for another opinion.  Had hysterectomy in March.  Not very happy with surgery recovery as well.  Had a lot of back pain for several weeks afterwards.    Had molar removed.  Had issues with poor pain control.  Was seeing pain management and failed drug test.    Denies vaginal bleeding.    Patient's last menstrual period was 03/18/2004.          Sexually active: No.  The current method of family planning is post menopausal status.  Exercising: No.   Smoker:  no  Health Maintenance: Pap:  08/07/15 Neg.   01/07/13 Neg. HR HPV:neg  History of abnormal Pap:  yes MMG:  08/17/15 BIRADS1:neg  Colonoscopy: never.  Will order cologuard.   BMD: 2014 TDaP:  2015 Pneumonia vaccine(s):  n/a Shingrix:   No Hep C testing: No Screening Labs: PCP   reports that she quit smoking about 16 years ago. Her smoking use included cigarettes. She quit after 10.00 years of use. she has never used smokeless tobacco. She reports that she drinks alcohol. She reports that she does not use drugs.  Past Medical History:  Diagnosis Date  . ADHD (attention deficit hyperactivity disorder)   . Arthritis   . Back pain   . Broken femur ([facility]CC) 08/27/12  . Migraine   . Osteoarthritis   . Thyroid disease    hypothyroidism    Past Surgical History:  Procedure Laterality Date  . BACK SURGERY    . CERVICAL BIOPSY  W/ LOOP ELECTRODE EXCISION  1990's   in [place], [place]  . ENDOMETRIAL BIOPSY  04-05-05 and 02-12-06   04-05-05 benign but too rapid growth. RXd Provera and repeat Bx 8mo. 02-12-06 Bx benign--Dr. Leda Quail  . FEMUR IM NAIL Left 08/27/2012   Procedure: INTRAMEDULLARY (IM) RETROGRADE FEMORAL NAILING;   Surgeon: Kathryne Hitch, MD;  Location: [facility] OR;  Service: Orthopedics;  Laterality: Left;  . HEEL SPUR SURGERY    . IM NAILING FEMORAL SHAFT FRACTURE Left 08/27/2012   Dr Magnus Ivan  . TOTAL KNEE ARTHROPLASTY     x2  . TUBAL LIGATION      Current Outpatient Medications  Medication Sig Dispense Refill  . amphetamine-dextroamphetamine (ADDERALL XR) 30 MG 24 hr capsule 2 qam 60 capsule 0  . clindamycin (CLEOCIN) 300 MG capsule Take 1 capsule (300 mg total) by mouth 3 (three) times daily. 30 capsule 0  . clobetasol ointment (TEMOVATE) 0.05 % Apply 1 application topically 2 (two) times daily. Apply as directed twice daily 60 g 1  . diazepam (VALIUM) 2 MG tablet Take 1 tablet (2 mg total) by mouth daily as needed for anxiety. 30 tablet 0  . Diclofenac Sodium CR 100 MG 24 hr tablet TAKE 1 TABLET BY MOUTH TWICE DAILY. TAKE WITH FOOD. 60 tablet 6  . Diphenhyd-Hydrocort-Nystatin (FIRST-DUKES MOUTHWASH) SUSP Take one tsp swish and spit qid 1 Bottle 0  . DULoxetine (CYMBALTA) 60 MG capsule Take 2 capsules (120 mg total) by mouth daily. 180 capsule 0  . ibuprofen (ADVIL,MOTRIN) 800 MG tablet Take by mouth daily as needed.    . lidocaine (XYLOCAINE) 2 % solution TAKE 5 TO 10  MLS BY MOUTH TWICE DAILY AS NEEDED. 100 mL 0  . nystatin (MYCOSTATIN/NYSTOP) powder APPLY TO AFFECTED AREA(S) TOPICALLY TWICE DAILY. 45 g 0  . omeprazole (PRILOSEC) 40 MG capsule TAKE (1) CAPSULE BY MOUTH ONCE DAILY. 30 capsule 2  . ondansetron (ZOFRAN ODT) 8 MG disintegrating tablet Take 1 tablet (8 mg total) by mouth every 8 (eight) hours as needed for nausea or vomiting. 20 tablet 2  . tolterodine (DETROL LA) 4 MG 24 hr capsule Take 1 capsule by mouth daily.    . traZODone (DESYREL) 100 MG tablet Take 100 mg by mouth at bedtime.    . triamcinolone (KENALOG) 0.1 % paste APPLY TO THE AFFECTED AREA ON THE TONGUE TWICE DAILY AS NEEDED. 5 g 0   No current facility-administered medications for this visit.     Family History   Problem Relation Age of Onset  . Cancer Mother   . Breast cancer Mother   . Bipolar disorder Mother   . Heart failure Father   . Cancer Father   . Cancer Other   . Heart failure Other   . Bipolar disorder Sister     ROS:  Pertinent items are noted in HPI.  Otherwise, a comprehensive ROS was negative.  Exam:   BP (!) 160/96 (BP Location: Right Arm, Patient Position: Sitting, Cuff Size: Large)   Pulse 80   Resp 16   Ht 5' 6.5" (1.689 m)   Wt 249 lb (112.9 kg)   LMP 03/18/2004   BMI 39.59 kg/m     Height: 5' 6.5" (168.9 cm)  Ht Readings from Last 3 Encounters:  04/14/17 5' 6.5" (1.689 m)  02/26/17 5\' 4"  (1.626 m)  12/30/16 5\' 4"  (1.626 m)    General appearance: alert, cooperative and appears stated age Head: Normocephalic, without obvious abnormality, atraumatic Neck: no adenopathy, supple, symmetrical, trachea midline and thyroid normal to inspection and palpation Lungs: clear to auscultation bilaterally Breasts: normal appearance, no masses or tenderness Heart: regular rate and rhythm Abdomen: soft, non-tender; bowel sounds normal; no masses,  no organomegaly Extremities: extremities normal, atraumatic, no cyanosis or edema Skin: Skin color, texture, turgor normal. No rashes or lesions Lymph nodes: Cervical, supraclavicular, and axillary nodes normal. No abnormal inguinal nodes palpated Neurologic: Grossly normal   Pelvic: External genitalia:  no lesions              Urethra:  normal appearing urethra with no masses, tenderness or lesions              Bartholins and Skenes: normal                 Vagina:  Erythematous, feathery lesion at mid position on anterio vaginal wall.  Biopsy recommended.  Verbal consent obtained.  Area cleansed with Betadine x 3.  Biopsy obtained.  Silver nitrate used for excellent hemostasis              Cervix: no lesions              Pap taken: No. Bimanual Exam:  Uterus:  uterus absent              Adnexa: no mass, fullness, tenderness                Rectovaginal: Confirms               Anus:  normal sphincter tone, no lesions  Chaperone was present for exam.  A:  Well Woman with normal exam PMP, no  HRT H/O TLH, A&P repair H/O hypothyroidism H/O left femur fracture OAB Hypertension (pt feels related to Detrol)  P:   Mammogram guidelines reviewed.  Aware due.  States will schedule. Declines colonoscopy.  Willing to do cologuard.  Order placed. pap smear not indicated Lab work will be done with Dr. Jacqulyn CaneLuking--seeing him tomorrow. Trial of myrbetriq 50mg  daily.  #30/1RF.  Pt aware of risks of hypertension with this as well.  Will monitor.  Advised Dr. Gerda DissLuking will likely treat her with antihypertensives tomorrow at appt.  Precautions given for being seen with DBP>100. Vaginal biopsy sent to pathology.  Results will be called to pt.     return annually or prn

## 2017-04-15 ENCOUNTER — Ambulatory Visit (INDEPENDENT_AMBULATORY_CARE_PROVIDER_SITE_OTHER): Payer: PPO | Admitting: Family Medicine

## 2017-04-15 ENCOUNTER — Encounter: Payer: Self-pay | Admitting: Family Medicine

## 2017-04-15 ENCOUNTER — Telehealth: Payer: Self-pay | Admitting: Family Medicine

## 2017-04-15 VITALS — BP 142/92 | Ht 64.0 in | Wt 247.6 lb

## 2017-04-15 DIAGNOSIS — R030 Elevated blood-pressure reading, without diagnosis of hypertension: Secondary | ICD-10-CM | POA: Diagnosis not present

## 2017-04-15 DIAGNOSIS — E875 Hyperkalemia: Secondary | ICD-10-CM | POA: Diagnosis not present

## 2017-04-15 DIAGNOSIS — F988 Other specified behavioral and emotional disorders with onset usually occurring in childhood and adolescence: Secondary | ICD-10-CM

## 2017-04-15 MED ORDER — AMPHETAMINE-DEXTROAMPHET ER 30 MG PO CP24: ORAL_CAPSULE | ORAL | 0 refills | Status: DC

## 2017-04-15 NOTE — Telephone Encounter (Signed)
Rx prior auth APPROVED for pt's amphetamine-dextroamphetamine (ADDERALL XR) 30 MG 24 hr capsule    Valid 04/15/17-03/17/18 through [employer]  Faxed approval to [facility], sent to be scanned, & filed

## 2017-04-15 NOTE — Progress Notes (Signed)
   Subjective:    Patient ID: Lauren Greene, female    DOB: 10/06/1965, 52 y.o.   MRN: [id number]  HPI Pt here today for med check(Adderall). Pt states shes not taking Detrol. BP has been elevated and pt states that she thinks it is due to Detrol. She relates her blood pressure went up after she started Detrol.  She did not have blood pressure problems before this.  She has been on ADD medicine for years and is not had blood pressure problems.  Denies any other particular troubles.  Review of Systems  Constitutional: Negative for activity change, appetite change and fatigue.  HENT: Negative for congestion.   Respiratory: Negative for cough.   Cardiovascular: Negative for chest pain.  Gastrointestinal: Negative for abdominal pain.  Skin: Negative for color change.  Neurological: Negative for headaches.  Psychiatric/Behavioral: Negative for behavioral problems.       Objective:   Physical Exam  Constitutional: She appears well-developed and well-nourished.  HENT:  Head: Normocephalic and atraumatic.  Eyes: Right eye exhibits no discharge. Left eye exhibits no discharge.  Cardiovascular: Normal rate, regular rhythm and normal heart sounds.  No murmur heard. Pulmonary/Chest: Effort normal and breath sounds normal. No respiratory distress. She has no wheezes. She has no rales.  Neurological: She is alert.  Skin: Skin is warm and dry.  Psychiatric: She has a normal mood and affect.  Vitals reviewed.   .15 15 minutes was spent with patient today discussing healthcare issues which they came. Greater than half of the discussion was answering questions and giving management guidance. Drug registry was checked     Assessment & Plan:  Has severe osteoarthritis will be seeing pain doctor soon  Elevated blood pressure she stopped Detrol blood pressure not terrible today she will watch diet monitor it and send us readings within 2 weeks  ADD 3 prescriptions written she is to follow-up  if problems

## 2017-04-15 NOTE — Patient Instructions (Signed)
DASH Eating Plan DASH stands for "Dietary Approaches to Stop Hypertension." The DASH eating plan is a healthy eating plan that has been shown to reduce high blood pressure (hypertension). It may also reduce your risk for type 2 diabetes, heart disease, and stroke. The DASH eating plan may also help with weight loss. What are tips for following this plan? General guidelines  Avoid eating more than 2,300 mg (milligrams) of salt (sodium) a day. If you have hypertension, you may need to reduce your sodium intake to 1,500 mg a day.  Limit alcohol intake to no more than 1 drink a day for nonpregnant women and 2 drinks a day for men. One drink equals 12 oz of beer, 5 oz of wine, or 1 oz of hard liquor.  Work with your health care provider to maintain a healthy body weight or to lose weight. Ask what an ideal weight is for you.  Get at least 30 minutes of exercise that causes your heart to beat faster (aerobic exercise) most days of the week. Activities may include walking, swimming, or biking.  Work with your health care provider or diet and nutrition specialist (dietitian) to adjust your eating plan to your individual calorie needs. Reading food labels  Check food labels for the amount of sodium per serving. Choose foods with less than 5 percent of the Daily Value of sodium. Generally, foods with less than 300 mg of sodium per serving fit into this eating plan.  To find whole grains, look for the word "whole" as the first word in the ingredient list. Shopping  Buy products labeled as "low-sodium" or "no salt added."  Buy fresh foods. Avoid canned foods and premade or frozen meals. Cooking  Avoid adding salt when cooking. Use salt-free seasonings or herbs instead of table salt or sea salt. Check with your health care provider or pharmacist before using salt substitutes.  Do not fry foods. Cook foods using healthy methods such as baking, boiling, grilling, and broiling instead.  Cook with  heart-healthy oils, such as olive, canola, soybean, or sunflower oil. Meal planning   Eat a balanced diet that includes: ? 5 or more servings of fruits and vegetables each day. At each meal, try to fill half of your plate with fruits and vegetables. ? Up to 6-8 servings of whole grains each day. ? Less than 6 oz of lean meat, poultry, or fish each day. A 3-oz serving of meat is about the same size as a deck of cards. One egg equals 1 oz. ? 2 servings of low-fat dairy each day. ? A serving of nuts, seeds, or beans 5 times each week. ? Heart-healthy fats. Healthy fats called Omega-3 fatty acids are found in foods such as flaxseeds and coldwater fish, like sardines, salmon, and mackerel.  Limit how much you eat of the following: ? Canned or prepackaged foods. ? Food that is high in trans fat, such as fried foods. ? Food that is high in saturated fat, such as fatty meat. ? Sweets, desserts, sugary drinks, and other foods with added sugar. ? Full-fat dairy products.  Do not salt foods before eating.  Try to eat at least 2 vegetarian meals each week.  Eat more home-cooked food and less restaurant, buffet, and fast food.  When eating at a restaurant, ask that your food be prepared with less salt or no salt, if possible. What foods are recommended? The items listed may not be a complete list. Talk with your dietitian about what   dietary choices are best for you. Grains Whole-grain or whole-wheat bread. Whole-grain or whole-wheat pasta. Brown rice. Oatmeal. Quinoa. Bulgur. Whole-grain and low-sodium cereals. Pita bread. Low-fat, low-sodium crackers. Whole-wheat flour tortillas. Vegetables Fresh or frozen vegetables (raw, steamed, roasted, or grilled). Low-sodium or reduced-sodium tomato and vegetable juice. Low-sodium or reduced-sodium tomato sauce and tomato paste. Low-sodium or reduced-sodium canned vegetables. Fruits All fresh, dried, or frozen fruit. Canned fruit in natural juice (without  added sugar). Meat and other protein foods Skinless chicken or turkey. Ground chicken or turkey. Pork with fat trimmed off. Fish and seafood. Egg whites. Dried beans, peas, or lentils. Unsalted nuts, nut butters, and seeds. Unsalted canned beans. Lean cuts of beef with fat trimmed off. Low-sodium, lean deli meat. Dairy Low-fat (1%) or fat-free (skim) milk. Fat-free, low-fat, or reduced-fat cheeses. Nonfat, low-sodium ricotta or cottage cheese. Low-fat or nonfat yogurt. Low-fat, low-sodium cheese. Fats and oils Soft margarine without trans fats. Vegetable oil. Low-fat, reduced-fat, or light mayonnaise and salad dressings (reduced-sodium). Canola, safflower, olive, soybean, and sunflower oils. Avocado. Seasoning and other foods Herbs. Spices. Seasoning mixes without salt. Unsalted popcorn and pretzels. Fat-free sweets. What foods are not recommended? The items listed may not be a complete list. Talk with your dietitian about what dietary choices are best for you. Grains Baked goods made with fat, such as croissants, muffins, or some breads. Dry pasta or rice meal packs. Vegetables Creamed or fried vegetables. Vegetables in a cheese sauce. Regular canned vegetables (not low-sodium or reduced-sodium). Regular canned tomato sauce and paste (not low-sodium or reduced-sodium). Regular tomato and vegetable juice (not low-sodium or reduced-sodium). Pickles. Olives. Fruits Canned fruit in a light or heavy syrup. Fried fruit. Fruit in cream or butter sauce. Meat and other protein foods Fatty cuts of meat. Ribs. Fried meat. Bacon. Sausage. Bologna and other processed lunch meats. Salami. Fatback. Hotdogs. Bratwurst. Salted nuts and seeds. Canned beans with added salt. Canned or smoked fish. Whole eggs or egg yolks. Chicken or turkey with skin. Dairy Whole or 2% milk, cream, and half-and-half. Whole or full-fat cream cheese. Whole-fat or sweetened yogurt. Full-fat cheese. Nondairy creamers. Whipped toppings.  Processed cheese and cheese spreads. Fats and oils Butter. Stick margarine. Lard. Shortening. Ghee. Bacon fat. Tropical oils, such as coconut, palm kernel, or palm oil. Seasoning and other foods Salted popcorn and pretzels. Onion salt, garlic salt, seasoned salt, table salt, and sea salt. Worcestershire sauce. Tartar sauce. Barbecue sauce. Teriyaki sauce. Soy sauce, including reduced-sodium. Steak sauce. Canned and packaged gravies. Fish sauce. Oyster sauce. Cocktail sauce. Horseradish that you find on the shelf. Ketchup. Mustard. Meat flavorings and tenderizers. Bouillon cubes. Hot sauce and Tabasco sauce. Premade or packaged marinades. Premade or packaged taco seasonings. Relishes. Regular salad dressings. Where to find more information:  National Heart, Lung, and Blood Institute: www.nhlbi.nih.gov  American Heart Association: www.heart.org Summary  The DASH eating plan is a healthy eating plan that has been shown to reduce high blood pressure (hypertension). It may also reduce your risk for type 2 diabetes, heart disease, and stroke.  With the DASH eating plan, you should limit salt (sodium) intake to 2,300 mg a day. If you have hypertension, you may need to reduce your sodium intake to 1,500 mg a day.  When on the DASH eating plan, aim to eat more fresh fruits and vegetables, whole grains, lean proteins, low-fat dairy, and heart-healthy fats.  Work with your health care provider or diet and nutrition specialist (dietitian) to adjust your eating plan to your individual   calorie needs. This information is not intended to replace advice given to you by your health care provider. Make sure you discuss any questions you have with your health care provider. Document Released: 02/21/2011 Document Revised: 02/26/2016 Document Reviewed: 02/26/2016 Elsevier Interactive Patient Education  2018 [employer]  

## 2017-04-16 ENCOUNTER — Encounter: Payer: Self-pay | Admitting: Student in an Organized Health Care Education/Training Program

## 2017-04-16 ENCOUNTER — Ambulatory Visit
Payer: PPO | Attending: Student in an Organized Health Care Education/Training Program | Admitting: Student in an Organized Health Care Education/Training Program

## 2017-04-16 ENCOUNTER — Other Ambulatory Visit: Payer: Self-pay

## 2017-04-16 VITALS — BP 158/89 | HR 66 | Temp 98.4°F | Resp 16 | Ht 66.0 in | Wt 249.0 lb

## 2017-04-16 DIAGNOSIS — M51369 Other intervertebral disc degeneration, lumbar region without mention of lumbar back pain or lower extremity pain: Secondary | ICD-10-CM

## 2017-04-16 DIAGNOSIS — F909 Attention-deficit hyperactivity disorder, unspecified type: Secondary | ICD-10-CM | POA: Insufficient documentation

## 2017-04-16 DIAGNOSIS — M1990 Unspecified osteoarthritis, unspecified site: Secondary | ICD-10-CM | POA: Insufficient documentation

## 2017-04-16 DIAGNOSIS — Z87891 Personal history of nicotine dependence: Secondary | ICD-10-CM | POA: Insufficient documentation

## 2017-04-16 DIAGNOSIS — Z809 Family history of malignant neoplasm, unspecified: Secondary | ICD-10-CM | POA: Insufficient documentation

## 2017-04-16 DIAGNOSIS — Z8249 Family history of ischemic heart disease and other diseases of the circulatory system: Secondary | ICD-10-CM | POA: Diagnosis not present

## 2017-04-16 DIAGNOSIS — G43909 Migraine, unspecified, not intractable, without status migrainosus: Secondary | ICD-10-CM | POA: Diagnosis not present

## 2017-04-16 DIAGNOSIS — K219 Gastro-esophageal reflux disease without esophagitis: Secondary | ICD-10-CM | POA: Diagnosis not present

## 2017-04-16 DIAGNOSIS — Z90710 Acquired absence of both cervix and uterus: Secondary | ICD-10-CM | POA: Diagnosis not present

## 2017-04-16 DIAGNOSIS — M797 Fibromyalgia: Secondary | ICD-10-CM

## 2017-04-16 DIAGNOSIS — Z79899 Other long term (current) drug therapy: Secondary | ICD-10-CM | POA: Insufficient documentation

## 2017-04-16 DIAGNOSIS — Z96659 Presence of unspecified artificial knee joint: Secondary | ICD-10-CM | POA: Insufficient documentation

## 2017-04-16 DIAGNOSIS — Z818 Family history of other mental and behavioral disorders: Secondary | ICD-10-CM | POA: Insufficient documentation

## 2017-04-16 DIAGNOSIS — Z803 Family history of malignant neoplasm of breast: Secondary | ICD-10-CM | POA: Insufficient documentation

## 2017-04-16 DIAGNOSIS — Z9889 Other specified postprocedural states: Secondary | ICD-10-CM | POA: Insufficient documentation

## 2017-04-16 DIAGNOSIS — M47816 Spondylosis without myelopathy or radiculopathy, lumbar region: Secondary | ICD-10-CM | POA: Diagnosis not present

## 2017-04-16 DIAGNOSIS — G894 Chronic pain syndrome: Secondary | ICD-10-CM

## 2017-04-16 DIAGNOSIS — M5136 Other intervertebral disc degeneration, lumbar region: Secondary | ICD-10-CM | POA: Insufficient documentation

## 2017-04-16 DIAGNOSIS — Z6841 Body Mass Index (BMI) 40.0 and over, adult: Secondary | ICD-10-CM | POA: Diagnosis not present

## 2017-04-16 LAB — BASIC METABOLIC PANEL
BUN/Creatinine Ratio: 22 (ref 9–23)
BUN: 13 mg/dL (ref 6–24)
CO2: 24 mmol/L (ref 20–29)
Calcium: 9.5 mg/dL (ref 8.7–10.2)
Chloride: 102 mmol/L (ref 96–106)
Creatinine, Ser: 0.58 mg/dL (ref 0.57–1.00)
GFR calc Af Amer: 123 mL/min/{1.73_m2} (ref >59)
GFR calc non Af Amer: 106 mL/min/{1.73_m2} (ref >59)
Glucose: 78 mg/dL (ref 65–99)
Potassium: 4.6 mmol/L (ref 3.5–5.2)
Sodium: 142 mmol/L (ref 134–144)

## 2017-04-16 NOTE — Patient Instructions (Signed)
1. UDS 2. Please have patient sign release of records- we need records from Dr Jordan LikesSpivey and Dr Juanetta GoslingHawkins before I see you again 3. We will schedule appointment after we have received your records

## 2017-04-16 NOTE — Progress Notes (Signed)
Patient's Name: Lauren Greene  MRN: [id number]  Referring Provider: Kathyrn Drown, MD  DOB: Jun 08, 1965  PCP: Lauren Drown, MD  DOS: 04/16/2017  Note by: Gillis Santa, MD  Service setting: Ambulatory outpatient  Specialty: Interventional Pain Management  Location: [facility] (AMB) Pain Management Facility  Visit type: Initial Patient Evaluation  Patient type: New Patient   Primary Reason(s) for Visit: Encounter for initial evaluation of one or more chronic problems (new to examiner) potentially causing chronic pain, and posing a threat to normal musculoskeletal function. (Level of risk: High) CC: Back Pain (lower)  HPI  Lauren Greene is a 52 y.o. year old, female patient, who comes today to see [place] for the first time for an initial evaluation of her chronic pain. 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 Today she comes in for evaluation of her Back Pain (lower)  Pain Assessment: Location: Lower Back Radiating: denies Onset: More than a month ago Duration: Chronic pain Quality: Stabbing, Aching Severity: 7 /10 (self-reported pain score)  Note: Reported level is inconsistent with clinical observations. Clinically the patient looks like a 3/10 A 3/10 is viewed as "Moderate" and described as significantly interfering with activities of daily living (ADL). It becomes difficult to feed, bathe, get dressed, get on and off the toilet or to perform personal hygiene functions. Difficult to get in and out  of bed or a chair without assistance. Very distracting. With effort, it can be ignored when deeply involved in activities.       When using our objective Pain Scale, levels between 6 and 10/10 are said to belong in an emergency room, as it progressively worsens from a 6/10, described as severely limiting, requiring emergency care not usually available at an outpatient pain management facility. At a 6/10 level, communication becomes difficult and requires great effort. Assistance to reach the emergency department may be required. Facial flushing and profuse sweating along with potentially dangerous increases in heart rate and blood pressure will be evident. Effect on ADL:   Timing: Constant Modifying factors: medication, ice  Onset and Duration: Present longer than 3 months Cause of pain: Unknown Severity: NAS-11 at its worse: 9/10 and NAS-11 at its best: 6/10 Timing: Morning, During activity or exercise and After activity or exercise Aggravating Factors: none Alleviating Factors: Cold packs, Medications and Resting Associated Problems: Day-time cramps, Night-time cramps, Numbness, Spasms, Weakness and Pain that wakes patient up Quality of Pain: Constant, Nagging and Sharp Previous Examinations or Tests: CT scan, MRI scan and Orthopedic evaluation Previous Treatments: Epidural steroid injections  The patient comes into the clinics today for the first time for a chronic pain management evaluation.   52 year old female with a history of morbid obesity who presents with cervical, lumbar, bilateral knee pain.  She states that she had knee surgery in the past after a femur fracture.  She has had bilateral total knee arthroplasty.  Patient's primary complaint is her axial low back pain.  She says that it radiates into her bilateral hips.  She finds it very difficult to walk and perform activities of daily living.  Patient ambulates using a walker.  She does work full-time.  Patient denies any bowel or  bladder dysfunction, saddle anesthesia or lower cavity weakness.  Patient was previously seen by orthopedic surgery at [facility].  Her UDS at that time was positive for morphine which was an unexpected result.  On further discussion with the patient, patient states that she took her father's morphine due to increased pain but states that that was a mistake and only a one-time occasion.  Previous interventions have included facet joint injections, SI joint injections and medication trials have included Robaxin, tizanidine, Percocet, last dose was in November.  She also sees a psychiatrist at [facility] where she receives Valium.  Patient states that she was previously seeing Dr. Vira Blanco and Dr. Luan Pulling. She was discharged from Dr Luan Pulling' clinic for being "honest" about her medication intake.  Today I took the time to provide the patient with information regarding my pain practice. The patient was informed that my practice is divided into two sections: an interventional pain management section, as well as a completely separate and distinct medication management section. I explained that I have procedure days for my interventional therapies, and evaluation days for follow-ups and medication management. Because of the amount of documentation required during both, they are kept separated. This means that there is the possibility that she may be scheduled for a procedure on one day, and medication management the next. I have also informed her that because of staffing and facility limitations, I no longer take patients for medication management only. To illustrate the reasons for this, I gave the patient the example of surgeons, and how inappropriate it would be to refer a patient to his/her care, just to write for the post-surgical antibiotics on a surgery done by a different surgeon.   Because interventional pain management is my board-certified specialty, the patient was informed that joining my practice means that  they are open to any and all interventional therapies. I made it clear that this does not mean that they will be forced to have any procedures done. What this means is that I believe interventional therapies to be essential part of the diagnosis and proper management of chronic pain conditions. Therefore, patients not interested in these interventional alternatives will be better served under the care of a different practitioner.  The patient was also made aware of my Comprehensive Pain Management Safety Guidelines where by joining my practice, they limit all of their nerve blocks and joint injections to those done by our practice, for as long as we are retained to manage their care.   Historic Controlled Substance Pharmacotherapy Review  PMP and historical list of controlled substances: 02/11/2017 1 02/11/2017 Oxycodone-Acetaminophen 10-325 40 7 Ma Dud [id number] Car (9744) 0 85.71 MME Comm Ins [place] Medications: The patient did not bring the medication(s) to the appointment, as requested in our "New Patient Package" Pharmacodynamics: Desired effects: Analgesia: The patient reports >50% benefit. Reported improvement in function: The patient reports medication allows her to accomplish basic ADLs. Clinically meaningful improvement in function (CMIF): Sustained CMIF goals met Perceived effectiveness: Described as relatively effective, allowing for increase in activities of daily living (ADL) Undesirable effects: Side-effects or Adverse reactions: None reported Historical Monitoring: The patient  reports that she does not use drugs. List of all UDS Test(s): No results found for: MDMA, COCAINSCRNUR, [place], [place], [place], [place], [place] List of other Serum/Urine Drug Screening Test(s):  No results found for: AMPHSCRSER, BARBSCRSER, BENZOSCRSER, COCAINSCRSER, COCAINSCRNUR, PCPSCRSER, PCPQUANT, THCSCRSER, [place], [place], [place], [place], [place], [place] Historical  Background  Evaluation: [place] PMP: Six (6) year initial data search conducted.             [place] Department of public safety, offender search: Editor, commissioning Information) Non-contributory Risk Assessment Profile: Aberrant behavior: None observed or detected today Risk factors for fatal opioid overdose: age 40-15 years old, Benzodiazepine use and None identified today Fatal overdose hazard ratio (HR): Calculation deferred Non-fatal overdose hazard ratio (HR): Calculation deferred Risk of opioid abuse or dependence: 0.7-3.0% with doses ? 36 MME/day and 6.1-26% with doses ? 120 MME/day. Substance use disorder (SUD) risk level: Moderate Opioid risk tool (ORT) (Total Score): 0 Opioid Risk Tool - 04/16/17 1156      Family History of Substance Abuse   Alcohol  Negative    Illegal Drugs  Negative    Rx Drugs  Negative      Personal History of Substance Abuse   Alcohol  Negative    Illegal Drugs  Negative    Rx Drugs  Negative      Age   Age between 24-45 years   No      History of Preadolescent Sexual Abuse   History of Preadolescent Sexual Abuse  Negative or Female      Psychological Disease   Psychological Disease  Negative    Depression  Negative      Total Score   Opioid Risk Tool Scoring  0    Opioid Risk Interpretation  Low Risk      ORT Scoring interpretation table:  Score <3 = Low Risk for SUD  Score between 4-7 = Moderate Risk for SUD  Score >8 = High Risk for Opioid Abuse   PHQ-2 Depression Scale:  Total score: 0  PHQ-2 Scoring interpretation table: (Score and probability of major depressive disorder)  Score 0 = No depression  Score 1 = 15.4% Probability  Score 2 = 21.1% Probability  Score 3 = 38.4% Probability  Score 4 = 45.5% Probability  Score 5 = 56.4% Probability  Score 6 = 78.6% Probability   PHQ-9 Depression Scale:  Total score: 0  PHQ-9 Scoring interpretation table:  Score 0-4 = No depression  Score 5-9 = Mild depression  Score 10-14 = Moderate depression  Score 15-19 =  Moderately severe depression  Score 20-27 = Severe depression (2.4 times higher risk of SUD and 2.89 times higher risk of overuse)   Pharmacologic Plan: As per protocol, I have not taken over any controlled substance management, pending the results of ordered tests and/or consults.            Initial impression: Pending review of available data and ordered tests.  Meds   Current Outpatient Medications:  .  amphetamine-dextroamphetamine (ADDERALL XR) 30 MG 24 hr capsule, 2 qam, Disp: 60 capsule, Rfl: 0 .  Diclofenac Sodium CR 100 MG 24 hr tablet, TAKE 1 TABLET BY MOUTH TWICE DAILY. TAKE WITH FOOD., Disp: 60 tablet, Rfl: 6 .  DULoxetine (CYMBALTA) 60 MG capsule, Take 2 capsules (120 mg total) by mouth daily., Disp: 180 capsule, Rfl: 0 .  mirabegron ER (MYRBETRIQ) 50 MG TB24 tablet, Take 1 tablet (50 mg total) by mouth daily., Disp: 30 tablet, Rfl: 1 .  traZODone (DESYREL) 100 MG tablet, Take 100 mg by mouth at bedtime. 2 tablets at bedtimr, Disp: , Rfl:  .  ibuprofen (ADVIL,MOTRIN) 800 MG tablet, Take by mouth daily as needed., Disp: , Rfl:   Imaging Review    Lumbar MR w/wo contrast:  Results for orders placed  in visit on 11/07/15  MR Lumbar Spine W Wo Contrast   Narrative CLINICAL DATA:  Back pain with right lower extremity pain. Prior lumbar surgery.  EXAM: MRI LUMBAR SPINE WITHOUT AND WITH CONTRAST  TECHNIQUE: Multiplanar and multiecho pulse sequences of the lumbar spine were obtained without and with intravenous contrast.  CONTRAST:  61m MULTIHANCE GADOBENATE DIMEGLUMINE 529 MG/ML IV SOLN  COMPARISON:  Lumbar spine MRI 02/17/2014  FINDINGS: Segmentation: Normal. The lowest disc space is considered to be L5-S1.  Alignment:  Grade 1 retrolisthesis at L1-2 and L2-3, unchanged.  Vertebrae: No acute compression fracture, facet edema or focal marrow lesion.  Conus medullaris: Extends to the L1 level and appears normal.  Paraspinal and other soft tissues: The visualized  aorta, IVC and iliac vessels are normal. The visualized retroperitoneal organs and paraspinal soft tissues are normal.  Disc levels:  T10-T11: Evaluated on sagittal images only. Small disc herniation without spinal canal or neural foraminal stenosis.  T11-T12: Evaluated on sagittal images only. Small disc herniation. No spinal canal or neural foraminal stenosis.  T12-L1: Normal disc space and facet joints. No spinal canal stenosis. No neuroforaminal stenosis.  L1-L2: Narrowing of the intervertebral disc space with disc edema. Mild facet hypertrophy. Small disc osteophyte complex. No spinal canal stenosis. Moderate bilateral, right worse than left, neuroforaminal stenosis. This is unchanged.  L2-L3: Diffuse disc bulge with mild narrowing of the lateral recesses. Mild unchanged spinal canal stenosis. Moderate bilateral unchanged neuroforaminal stenosis.  L3-L4: Diffuse disc bulge with superimposed central protrusion, unchanged. Bilateral mild facet hypertrophy. Unchanged moderate spinal canal stenosis. Moderate bilateral neural foraminal stenosis. This level is unchanged.  L4-L5: Diffuse disc bulge with narrowing of the left-greater-than-right lateral recesses. No spinal canal stenosis. Mild right and moderate left neuroforaminal stenosis. This level is unchanged.  L5-S1: Left eccentric disc bulge and moderate bilateral facet hypertrophy. No spinal canal stenosis. Moderate left neuroforaminal stenosis, unchanged.  IMPRESSION: 1. Worsened disc edema at L1-L2 with unchanged neuroforaminal stenosis, right greater than left. 2. Unchanged moderate spinal canal stenosis at L3-L4. There is also moderate bilateral neural foraminal narrowing at this level, also unchanged. 3. Unchanged left eccentric disc bulge at L5-S1 narrowing the left neural foramen.   Electronically Signed   By: KUlyses JarredM.D.   On: 12/01/2015 02:19     Lumbar DG (Complete) 4+V:  Results for orders  placed in visit on 06/21/15  DG Lumbar Spine Complete   Narrative CLINICAL DATA:  Chronic back pain with recent worsening on the right. No radiculopathy, no recent injury.  EXAM: LUMBAR SPINE - COMPLETE 4+ VIEW  COMPARISON:  MR lumbar spine 06/30/2008.  FINDINGS: There is levoconvex curvature centered at L1-2. Associated minimal retrolisthesis of L1 on L2, with advanced secondary degenerative disc disease. Multilevel endplate degenerative changes are seen in the thoracolumbar spine with associated disc space narrowing. Disc space height at L3-4 is relatively maintained. Multilevel facet hypertrophy. No definite pars defects.  IMPRESSION: Advanced multilevel degenerative disc disease.   Electronically Signed   By: MLorin PicketM.D.   On: 07/07/2015 08:12     Lumbar DG Epidurogram OP:  Results for orders placed during the hospital encounter of 11/10/08  DG Epidurography   Narrative Clinical Data:  Low back pain.  Good response to previous lumbar epidurals.  Ovary exertion with recurrence of pain, more on the right than the left presently.   Procedure: The procedure, risks, benefits, and alternatives were explained to the patient. Questions regarding the procedure were encouraged and  answered. The patient understands and consents to the procedure.   LUMBAR EPIDURAL INJECTION: An interlaminar approach was performed on the right at L4-5.  The overlying skin was cleansed with Betadine, draped in sterile fashion, and anesthetized using 1% Lidocaine.  A 4.5 inch 20 gauge spinal needle was advanced using loss-of-resistance technique.   DIAGNOSTIC EPIDURAL INJECTION: Injection of Omnipaque 180 shows a good epidural pattern with spread above and below the level of needle placement, primarily on the right, but the both sides. No vascular opacification is seen.  Pattern of spread seems normal.   THERAPEUTIC EPIDURAL INJECTION: 162m of Depo-Medrol mixed with 3 ml 1%  lidocaine were instilled.  The procedure was well-tolerated, and the patient was discharged thirty minutes following the injection in good condition.   Fluoro time:  1 minute 6 seconds   IMPRESSION: Technically successful thirdepidural injection on the right at L4-5.  Provider: RAlfredo Batty   Results for orders placed in visit on 02/17/14  MR [place]   Results for orders placed during the hospital encounter of 08/27/12  DG Knee Complete 4 Views Left   Narrative *RADIOLOGY REPORT*  Clinical Data: Fall with knee pain.  LEFT KNEE - COMPLETE 4+ VIEW  Comparison: None  Findings: Left total knee replacement identified without hardware complicating features.  A mildly comminuted oblique fracture of the distal femoral diaphysis is identified with 1 cm posterior and lateral displacement. There is no evidence of subluxation or dislocation. No focal bony lesions are present.  IMPRESSION: Mildly comminuted oblique distal femoral fracture as described.   Original Report Authenticated By: JMargarette Canada M.D.      Complexity Note: Imaging results reviewed. Results shared with Ms. SConley Canal using Layman's terms.                         ROS  Cardiovascular History: No reported cardiovascular signs or symptoms such as High blood pressure, coronary artery disease, abnormal heart rate or rhythm, heart attack, blood thinner therapy or heart weakness and/or failure Pulmonary or Respiratory History: No reported pulmonary signs or symptoms such as wheezing and difficulty taking a deep full breath (Asthma), difficulty blowing air out (Emphysema), coughing up mucus (Bronchitis), persistent dry cough, or temporary stoppage of breathing during sleep Neurological History: No reported neurological signs or symptoms such as seizures, abnormal skin sensations, urinary and/or fecal incontinence, being born with an abnormal open spine and/or a tethered spinal cord Review of Past  Neurological Studies:  Results for orders placed or performed during the hospital encounter of 06/06/12  CT Head Wo Contrast   Narrative   *RADIOLOGY REPORT*  Clinical Data: Headache  CT HEAD WITHOUT CONTRAST  Technique:  Contiguous axial images were obtained from the base of the skull through the vertex without contrast.  Comparison: None.  Findings: There is no evidence of acute intracranial hemorrhage, brain edema, mass lesion, acute infarction,   mass effect, or midline shift. Acute infarct may be inapparent on noncontrast CT. No other intra-axial abnormalities are seen, and the ventricles and sulci are within normal limits in size and symmetry.   No abnormal extra-axial fluid collections or masses are identified.  No significant calvarial abnormality.  IMPRESSION: 1. Negative for bleed or other acute intracranial process.   Original Report Authenticated By: D. HWallace Going MD    Psychological-Psychiatric History: No reported psychological or psychiatric signs or symptoms such as difficulty sleeping, anxiety, depression, delusions or hallucinations (schizophrenial), mood swings (bipolar disorders)  or suicidal ideations or attempts Gastrointestinal History: No reported gastrointestinal signs or symptoms such as vomiting or evacuating blood, reflux, heartburn, alternating episodes of diarrhea and constipation, inflamed or scarred liver, or pancreas or irrregular and/or infrequent bowel movements Genitourinary History: No reported renal or genitourinary signs or symptoms such as difficulty voiding or producing urine, peeing blood, non-functioning kidney, kidney stones, difficulty emptying the bladder, difficulty controlling the flow of urine, or chronic kidney disease Hematological History: No reported hematological signs or symptoms such as prolonged bleeding, low or poor functioning platelets, bruising or bleeding easily, hereditary bleeding problems, low energy levels due to low  hemoglobin or being anemic Endocrine History: No reported endocrine signs or symptoms such as high or low blood sugar, rapid heart rate due to high thyroid levels, obesity or weight gain due to slow thyroid or thyroid disease Rheumatologic History: No reported rheumatological signs and symptoms such as fatigue, joint pain, tenderness, swelling, redness, heat, stiffness, decreased range of motion, with or without associated rash Musculoskeletal History: Negative for myasthenia gravis, muscular dystrophy, multiple sclerosis or malignant hyperthermia Work History: Disabled  Allergies  Ms. Roadcap is allergic to wellbutrin [bupropion] and lyrica [pregabalin].  Laboratory Chemistry  Inflammation Markers (CRP: Acute Phase) (ESR: Chronic Phase) Lab Results  Component Value Date   CRP 1.7 08/03/2014   ESRSEDRATE 2 08/03/2014                 Rheumatology Markers Lab Results  Component Value Date   RF 12 06/24/2013   ANA Negative 08/03/2014                Renal Function Markers Lab Results  Component Value Date   BUN 13 04/15/2017   CREATININE 0.58 04/15/2017   GFRAA 123 04/15/2017   GFRNONAA 106 04/15/2017                 Hepatic Function Markers Lab Results  Component Value Date   AST 26 03/15/2017   ALT 26 03/15/2017   ALBUMIN 4.3 03/15/2017   ALKPHOS 117 03/15/2017   LIPASE 25 01/12/2016                 Electrolytes Lab Results  Component Value Date   NA 142 04/15/2017   K 4.6 04/15/2017   CL 102 04/15/2017   CALCIUM 9.5 04/15/2017                 Neuropathy Markers Lab Results  Component Value Date   HGBA1C 5.6 07/22/2014                 Bone Pathology Markers Lab Results  Component Value Date   VD25OH 31 01/07/2013                 Coagulation Parameters Lab Results  Component Value Date   INR 1.04 02/16/2013   LABPROT 13.6 02/16/2013   APTT 29 02/16/2013   PLT 274 01/12/2016                 Cardiovascular Markers Lab Results  Component Value  Date   CKTOTAL 139 08/03/2014   CKMB 2.8 06/24/2013   HGB 14.6 01/12/2016   HCT 43.4 01/12/2016                 CA Markers No results found for: CEA, CA125, LABCA2               Note: Lab results reviewed.  [place]  Drug: Ms. Hanisch  reports that  she does not use drugs. Alcohol:  reports that she drinks alcohol. Tobacco:  reports that she quit smoking about 16 years ago. Her smoking use included cigarettes. She quit after 10.00 years of use. she has never used smokeless tobacco. Medical:  has a past medical history of ADHD (attention deficit hyperactivity disorder), Arthritis, Back pain, Broken femur ([place]) (08/27/12), Migraine, and Osteoarthritis. Family: family history includes Bipolar disorder in her mother and sister; Breast cancer in her mother; Cancer in her father, mother, and other; Heart failure in her father and other.  Past Surgical History:  Procedure Laterality Date  . BACK SURGERY    . CERVICAL BIOPSY  W/ LOOP ELECTRODE EXCISION  1990's   in [place], [place]  . ENDOMETRIAL BIOPSY  04-05-05 and 02-12-06   04-05-05 benign but too rapid growth. RXd Provera and repeat Bx 34mo 02-12-06 Bx benign--Dr. SEdwinna Areola . FEMUR IM NAIL Left 08/27/2012   Procedure: INTRAMEDULLARY (IM) RETROGRADE FEMORAL NAILING;  Surgeon: CMcarthur Rossetti MD;  [facility]  Service: Orthopedics;  Laterality: Left;  . HEEL SPUR SURGERY    . IM NAILING FEMORAL SHAFT FRACTURE Left 08/27/2012   Dr BNinfa Linden . SHOULDER SURGERY Left   . TOTAL KNEE ARTHROPLASTY     x2  . TUBAL LIGATION    . VAGINAL HYSTERECTOMY     Active Ambulatory Problems    Diagnosis Date Noted  . ADD (attention deficit disorder) 07/08/2012  . 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 Depression, major, in remission ([place] 01/05/2013  . Chronic arthralgias of knees and hips 01/05/2013  . Chronic back pain 02/16/2013  . Arthritis 06/24/2013  . Myalgia and myositis 06/24/2013  .  Elevated blood pressure 01/31/2014  . Morbid obesity ([place] 08/07/2015  . Osteoarthritis 08/16/2015  . 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 Moderate episode of recurrent major depressive disorder ([place] 02/01/2016  . Bunion, right 03/24/2017  . Lumbar degenerative disc disease 01/06/2017  . GERD (gastroesophageal reflux disease) 05/27/2016  . Chronic, continuous use of opioids 05/27/2016  . Fibromyalgia 05/27/2016   Resolved Ambulatory Problems    Diagnosis Date Noted  . 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 ADHD (attention deficit hyperactivity disorder)   . Arthritis   . Back pain   . Broken femur ([place] 08/27/12  . Migraine   . Osteoarthritis    Constitutional Exam  General appearance: morbidly obese and Well nourished, well developed, and well hydrated. In no apparent acute distress Vitals:   04/16/17 1147  BP: (!) 158/89  Pulse: 66  Resp: 16  Temp: 98.4 F (36.9 C)  TempSrc: Oral  SpO2: 100%  Weight: 249 lb (112.9 kg)  Height: _0  (1.676 m)   BMI Assessment: Estimated body mass index is 40.19 kg/m as calculated from the following:   Height as of this encounter: _1  (1.676 m).   Weight as of this encounter: 249 lb (112.9 kg).  BMI interpretation table: BMI level Category Range association with higher incidence of chronic pain  <18 kg/m2 Underweight   18.5-24.9 kg/m2 Ideal body weight   25-29.9 kg/m2 Overweight Increased incidence by 20%  30-34.9 kg/m2 Obese (Class I) Increased incidence by 68%  35-39.9 kg/m2 Severe obesity (Class II) Increased incidence by 136%  >40 kg/m2 Extreme obesity (Class III) Increased incidence by 254%   BMI Readings from Last 4 Encounters:  04/16/17 40.19 kg/m  04/15/17 42.50 kg/m  04/14/17 39.59 kg/m  04/08/17 42.23 kg/m   Wt Readings from Last 4 Encounters:  04/16/17 249 lb (112.9 kg)  04/15/17 247 lb 9.6 oz (112.3 kg)  04/14/17 249 lb (112.9 kg)  04/08/17 246 lb (111.6  kg)  Psych/Mental status: Alert, oriented x 3 (person, place, & time)       Eyes: PERLA Respiratory: No evidence of acute respiratory distress  Cervical Spine Area Exam  Skin & Axial Inspection: No masses, redness, edema, swelling, or associated skin lesions Alignment: Symmetrical Functional ROM: Unrestricted ROM      Stability: No instability detected Muscle Tone/Strength: Functionally intact. No obvious neuro-muscular anomalies detected. Sensory (Neurological): Unimpaired Palpation: No palpable anomalies              Upper Extremity (UE) Exam    Side: Right upper extremity  Side: Left upper extremity  Skin & Extremity Inspection: Skin color, temperature, and hair growth are WNL. No peripheral edema or cyanosis. No masses, redness, swelling, asymmetry, or associated skin lesions. No contractures.  Skin & Extremity Inspection: Skin color, temperature, and hair growth are WNL. No peripheral edema or cyanosis. No masses, redness, swelling, asymmetry, or associated skin lesions. No contractures.  Functional ROM: Unrestricted ROM          Functional ROM: Unrestricted ROM          Muscle Tone/Strength: Functionally intact. No obvious neuro-muscular anomalies detected.  Muscle Tone/Strength: Functionally intact. No obvious neuro-muscular anomalies detected.  Sensory (Neurological): Unimpaired          Sensory (Neurological): Unimpaired          Palpation: No palpable anomalies              Palpation: No palpable anomalies              Specialized Test(s): Deferred         Specialized Test(s): Deferred          Thoracic Spine Area Exam  Skin & Axial Inspection: No masses, redness, or swelling Alignment: Symmetrical Functional ROM: Unrestricted ROM Stability: No instability detected Muscle Tone/Strength: Functionally intact. No obvious neuro-muscular anomalies detected. Sensory (Neurological): Unimpaired Muscle strength & Tone: No palpable anomalies  Lumbar Spine Area Exam  Skin & Axial  Inspection: No masses, redness, or swelling Alignment: Symmetrical Functional ROM: Decreased ROM, bilaterally Stability: No instability detected Muscle Tone/Strength: Functionally intact. No obvious neuro-muscular anomalies detected. Sensory (Neurological): Articular pain pattern Palpation: No palpable anomalies       Provocative Tests: Lumbar Hyperextension and rotation test: Positive bilaterally for facet joint pain. Lumbar Lateral bending test: Positive ipsilateral radicular pain, bilaterally. Positive for bilateral foraminal stenosis. Patrick's Maneuver: Positive for bilateral S-I arthralgia              Gait & Posture Assessment  Ambulation: Patient ambulates using a cane Gait: Antalgic Posture: Difficulty standing up straight, due to pain   Lower Extremity Exam    Side: Right lower extremity  Side: Left lower extremity  Skin & Extremity Inspection: Evidence of prior arthroplastic surgery  Skin & Extremity Inspection: Evidence of prior arthroplastic surgery  Functional ROM: Decreased ROM for knee joint  Functional ROM: Decreased ROM for knee joint  Muscle Tone/Strength: Functionally intact. No obvious neuro-muscular anomalies detected.  Muscle Tone/Strength: Functionally intact. No obvious neuro-muscular anomalies detected.  Sensory (Neurological): Arthropathic arthralgia  Sensory (Neurological): Arthropathic arthralgia  Palpation: No palpable anomalies  Palpation: No palpable anomalies   Assessment  Primary Diagnosis & Pertinent Problem List: The primary encounter diagnosis was Lumbar  spondylosis. Diagnoses of Lumbar degenerative disc disease, Lumbar facet arthropathy, Fibromyalgia, and Chronic pain syndrome were also pertinent to this visit.  Visit Diagnosis (New problems to examiner): 1. Lumbar spondylosis   2. Lumbar degenerative disc disease   3. Lumbar facet arthropathy   4. Fibromyalgia   5. Chronic pain syndrome    52 year old female with a history of morbid obesity,  chronic back pain, bilateral knee pain secondary to bilateral total knee arthroplasty, back pain secondary to lumbar degenerative disc disease, multilevel lumbar disc herniations, facet arthropathy along with a diagnosis of fibromyalgia.  Patient does have a history of a broken femur in June 2014.  She is status post left shoulder surgery status post bilateral total knee arthroplasty.  She also has an intramedullary nail in her femoral shaft.  Patient has been on chronic opioid therapy in the past with Percocet 10 mg up to 5 times a day (quantity 150).  Her last prescription of this medication was in November.  She was previously seeing 2 other pain management clinics: Dr. Vira Blanco and Dr. Luan Pulling in the past.  She was discharged from Dr. Luan Pulling clinic due to being honest about her medication intake.  Furthermore she does have one documented urine drug screen at her last appointment with orthopedic surgery that was positive for morphine.  We discussed that at length.  Patient states that she will not take medications that are not prescribed to her.  I had a discussion with the patient about my concerns regarding high-dose opioid therapy.  I would like to obtain outside medical records from the patient's previous pain providers.  I am particularly interested in seeing their therapeutic plans as well as their interventional procedural history.  Furthermore I will also obtain a urine drug screen today.  This should be negative for any opioids and only positive for her Adderall.  Patient will follow-up with me after we have received her outside medical records.  She is instructed to continue care with her psychiatrist.  We will also send the patient for physical therapy to help out with her axial low back pain in terms of lumbar paraspinal muscle strengthening and range of motion exercises.  Of note patient has tried gabapentin in the past which resulted in sedation.  She has tried Lyrica which resulted in lower  extremity swelling.  She is currently on Cymbalta 120 mg daily.  Patient is also tried various muscle relaxants including Robaxin and tizanidine which were not effective.  Future considerations for this patient would include bilateral genicular nerve block, lumbar facet medial branch blocks at L3/4, L4/5 and L5/S1.  We also discussed dieting and healthy eating and the importance of weight loss in the management of her overall pain condition.  Plan: -UDS today.  Should only be positive for Adderall. -Patient to sign release of medical records to obtain records from Dr. Vira Blanco  and Dr. Luan Pulling. -Patient to follow-up only after we have received outside medical records from previous pain providers. -Referral for physical therapy for axial low back pain and hip pain.  Also discussed aquatic therapy and diet modification.    Ordered Lab-work, Procedure(s), Referral(s), & Consult(s): Orders Placed This Encounter  Procedures  . Compliance Drug Analysis, Ur  . Ambulatory referral to Physical Therapy    Provider-requested follow-up: Return in about 3 weeks (around 05/07/2017).  Future Appointments  Date Time Provider [place]  05/08/2017  1:30 PM Gillis Santa, MD [facility] None  07/08/2017  3:30 PM Norman Clay, MD [facility] None  07/11/2017  3:00 PM Lauren Drown, MD [facility]  06/23/2018  2:00 PM Megan Salon, MD [place] None    Primary Care Physician: Lauren Drown, MD Location: [facility] Note by: Gillis Santa, M.D, Date: 04/16/2017; Time: 1:24 PM  Patient Instructions  1. UDS 2. Please have patient sign release of records- we need records from Dr Vira Blanco and Dr Luan Pulling before I see you again 3. We will schedule appointment after we have received your records

## 2017-04-16 NOTE — Progress Notes (Signed)
Safety precautions to be maintained throughout the outpatient stay will include: orient to surroundings, keep bed in low position, maintain call bell within reach at all times, provide assistance with transfer out of bed and ambulation.  

## 2017-04-17 ENCOUNTER — Encounter: Payer: Self-pay | Admitting: Family Medicine

## 2017-04-18 ENCOUNTER — Telehealth: Payer: Self-pay

## 2017-04-18 NOTE — Telephone Encounter (Signed)
Spoke with patient. Results given. Patient verbalizes understanding. Encounter closed. 

## 2017-04-18 NOTE — Telephone Encounter (Signed)
-----   Message from Lauren BearsMary S Miller, MD sent at 04/17/2017  6:26 PM EST ----- Please let patient know the vaginal biopsy did show granulation tissue.  No other treatment is needed.

## 2017-04-21 ENCOUNTER — Encounter: Payer: Self-pay | Admitting: Student in an Organized Health Care Education/Training Program

## 2017-04-22 ENCOUNTER — Telehealth: Payer: Self-pay | Admitting: *Deleted

## 2017-04-22 LAB — COMPLIANCE DRUG ANALYSIS, UR

## 2017-04-22 NOTE — Telephone Encounter (Signed)
Requesting order for rehab be sent to [facility].

## 2017-04-22 NOTE — Telephone Encounter (Signed)
Requesting order for rehab be sent to [place]. 

## 2017-04-29 DIAGNOSIS — Z1211 Encounter for screening for malignant neoplasm of colon: Secondary | ICD-10-CM | POA: Diagnosis not present

## 2017-04-29 DIAGNOSIS — Z1212 Encounter for screening for malignant neoplasm of rectum: Secondary | ICD-10-CM | POA: Diagnosis not present

## 2017-05-07 ENCOUNTER — Ambulatory Visit (HOSPITAL_COMMUNITY): Payer: PPO | Attending: Student in an Organized Health Care Education/Training Program

## 2017-05-07 ENCOUNTER — Encounter (HOSPITAL_COMMUNITY): Payer: Self-pay

## 2017-05-07 ENCOUNTER — Other Ambulatory Visit: Payer: Self-pay

## 2017-05-07 DIAGNOSIS — M6281 Muscle weakness (generalized): Secondary | ICD-10-CM | POA: Insufficient documentation

## 2017-05-07 DIAGNOSIS — M545 Low back pain, unspecified: Secondary | ICD-10-CM

## 2017-05-07 DIAGNOSIS — R2689 Other abnormalities of gait and mobility: Secondary | ICD-10-CM | POA: Diagnosis not present

## 2017-05-07 DIAGNOSIS — G8929 Other chronic pain: Secondary | ICD-10-CM | POA: Diagnosis not present

## 2017-05-08 ENCOUNTER — Encounter: Payer: Self-pay | Admitting: Student in an Organized Health Care Education/Training Program

## 2017-05-08 ENCOUNTER — Ambulatory Visit
Payer: PPO | Attending: Student in an Organized Health Care Education/Training Program | Admitting: Student in an Organized Health Care Education/Training Program

## 2017-05-08 VITALS — BP 172/94 | HR 72 | Temp 98.6°F | Resp 16 | Ht 66.0 in | Wt 249.0 lb

## 2017-05-08 DIAGNOSIS — M545 Low back pain: Secondary | ICD-10-CM | POA: Diagnosis present

## 2017-05-08 DIAGNOSIS — M797 Fibromyalgia: Secondary | ICD-10-CM | POA: Diagnosis not present

## 2017-05-08 DIAGNOSIS — Z90710 Acquired absence of both cervix and uterus: Secondary | ICD-10-CM | POA: Insufficient documentation

## 2017-05-08 DIAGNOSIS — Z9889 Other specified postprocedural states: Secondary | ICD-10-CM | POA: Diagnosis not present

## 2017-05-08 DIAGNOSIS — F909 Attention-deficit hyperactivity disorder, unspecified type: Secondary | ICD-10-CM | POA: Insufficient documentation

## 2017-05-08 DIAGNOSIS — Z79891 Long term (current) use of opiate analgesic: Secondary | ICD-10-CM | POA: Diagnosis not present

## 2017-05-08 DIAGNOSIS — F339 Major depressive disorder, recurrent, unspecified: Secondary | ICD-10-CM | POA: Insufficient documentation

## 2017-05-08 DIAGNOSIS — M51369 Other intervertebral disc degeneration, lumbar region without mention of lumbar back pain or lower extremity pain: Secondary | ICD-10-CM

## 2017-05-08 DIAGNOSIS — K219 Gastro-esophageal reflux disease without esophagitis: Secondary | ICD-10-CM | POA: Diagnosis not present

## 2017-05-08 DIAGNOSIS — M47816 Spondylosis without myelopathy or radiculopathy, lumbar region: Secondary | ICD-10-CM | POA: Diagnosis not present

## 2017-05-08 DIAGNOSIS — Z79899 Other long term (current) drug therapy: Secondary | ICD-10-CM | POA: Diagnosis not present

## 2017-05-08 DIAGNOSIS — M5136 Other intervertebral disc degeneration, lumbar region: Secondary | ICD-10-CM | POA: Insufficient documentation

## 2017-05-08 DIAGNOSIS — G894 Chronic pain syndrome: Secondary | ICD-10-CM | POA: Diagnosis not present

## 2017-05-08 DIAGNOSIS — Z87891 Personal history of nicotine dependence: Secondary | ICD-10-CM | POA: Diagnosis not present

## 2017-05-08 DIAGNOSIS — Z6841 Body Mass Index (BMI) 40.0 and over, adult: Secondary | ICD-10-CM | POA: Insufficient documentation

## 2017-05-08 DIAGNOSIS — Z9851 Tubal ligation status: Secondary | ICD-10-CM | POA: Insufficient documentation

## 2017-05-08 MED ORDER — IBUPROFEN 800 MG PO TABS: 800.0000 mg | ORAL_TABLET | Freq: Two times a day (BID) | ORAL | 2 refills | Status: DC | PRN

## 2017-05-08 NOTE — Progress Notes (Signed)
Safety precautions to be maintained throughout the outpatient stay will include: orient to surroundings, keep bed in low position, maintain call bell within reach at all times, provide assistance with transfer out of bed and ambulation.  

## 2017-05-08 NOTE — Patient Instructions (Signed)
You were given one prescription for Ibuprofen today.

## 2017-05-08 NOTE — Therapy (Signed)
[facility] [address] [place], [place], [address] Phone: [phone]   Fax:  [phone]  Physical Therapy Evaluation  Patient Details  Name: Lauren Greene MRN: [id number] Date of Birth: 04-11-1965 Referring Provider: Edward Jolly, MD   Encounter Date: 05/07/2017  PT End of Session - 05/07/17 0001     Visit Number 1  Number of Visits 4  Date for PT Re-Evaluation 05/28/2017  Authorization Type [employer] (Medicaid Secondary)  Authorization Time Period cert: 9/52/84 - 06/18/17; medicaid auth in for initial (will put in re-auth when first one is approved)  Authorization - Visit Number 1  Authorization - Number of Visits 4  PT Start Time 1431  PT Stop Time 1516  PT Time Calculation (min) 45 min (calculated)  Equipment Utilized During Treatment   Activity Tolerance Patient tolerated treatment well; No increased pain  Behavior During Therapy WFL for tasks assessed/performed     Past Medical History:  Diagnosis Date  . ADHD (attention deficit hyperactivity disorder)   . Arthritis   . Back pain   . Broken femur (HCC) 08/27/12  . Migraine   . Osteoarthritis     Past Surgical History:  Procedure Laterality Date  . BACK SURGERY    . CERVICAL BIOPSY  W/ LOOP ELECTRODE EXCISION  1990's   in [place], [place]  . ENDOMETRIAL BIOPSY  04-05-05 and 02-12-06   04-05-05 benign but too rapid growth. RXd Provera and repeat Bx 80mo. 02-12-06 Bx benign--Dr. Leda Quail  . FEMUR IM NAIL Left 08/27/2012   Procedure: INTRAMEDULLARY (IM) RETROGRADE FEMORAL NAILING;  Surgeon: Kathryne Hitch, MD;  Location: [facility] OR;  Service: Orthopedics;  Laterality: Left;  . HEEL SPUR SURGERY    . IM NAILING FEMORAL SHAFT FRACTURE Left 08/27/2012   Dr Magnus Ivan  . SHOULDER SURGERY Left   . TOTAL KNEE ARTHROPLASTY     x2  . TUBAL LIGATION    . VAGINAL HYSTERECTOMY      There were no vitals filed for this visit.     Symptoms and Limitations -  05/07/17 0001    Subjective Patient reports a significant history of low back pain and several falls over the last decade resulting in injury. She reports roughly 10 years a go she fell going down her stairs and "chipped cartilage" in her low back, she had a "laproscopic back surgery" to remove chipped piece. She reports another fall that occurred ~ 4 years ago while she was working in her backyard and slipped on mud resulting gin a fractured femur of her left leg. She had surgery with a "rod" to fix her bone. She states after that surgery to she has not been able to walk properly since and it has resulting in worsening back pain. She states she typically uses a SPC to ambulate because she limps without it which puts a strain on her low back. She reports that she has had treatment for her back pain through the pain clinic before and has taken Percocet for 20+ years, however states she is no longer taking it. She also states that stairs aggravate her back pain and because of her weakness she typically goes up the stairs on all 4's, stating "I crawl up the stairs". Throughout the evaluation she reported additional surgeries and injuries including Bil RC injuries with exploratory surgery to her Left shoulder in which she was told her RC could not be repaired. She also had a bladder re-attachment this past March. She states  that before her fall 4 years ago she was experiencing some sciatica and state she had that in the early 90's as well. She denies numbness and tingling into her legs or radiating pain. She states her pain has just gradually gotten worse over the last four years and states "no-one ever offered me PT until now". She would like to reduce her pain and be able to walk for longer without pain.  Pertinent History Femur fracture with fixation 4 years ago, fall on stairs ~10 years ago resulting in back surgery, lumbar injections, Bil TKA 5-6 years ago, has taken percocet for pain for 20+ years  Limitations  Sitting; Lifting; Standing; Walking; House hold activities  How long can you sit comfortably? several minutes but shifts weight  How long can you stand comfortably? 10 minutes  How long can you walk comfortably? 5-10 minutes  Patient Stated Goals walk more normally and decrease back pain  Currently in Pain? Yes  Pain Score 5   Pain Location Back  Pain Orientation Lower  Pain Descriptors / Indicators Aching; Constant; Dull  Pain Type Chronic pain  Pain Onset More than a month ago  Pain Frequency Constant  Aggravating Factors  walking, stairs, lifting,   Pain Relieving Factors ice, pain medication  Effect of Pain on Daily Activities pushes through it as much as she can       [facility] PT Assessment - 05/07/17 0001      Assessment   Medical Diagnosis  Lumbar Spondylosis    Referring Provider  Edward Jolly, MD    Onset Date/Surgical Date  -- fell 4 years ago in june    Next MD Visit  05/08/17    Prior Therapy  yes for TKA bil, 6+ years ago      Precautions   Precautions  Fall      Restrictions   Weight Bearing Restrictions  No      Home Environment   Living Environment  Private residence    Living Arrangements  Alone    Type of Home  House    Home Access  Level entry    Home Layout  Multi-level (4 level)   Alternate Level Stairs-Number of Steps  8-10 steps for each flight    Home Equipment  Walker - 2 wheels;Walker - 4 wheels;Cane - quad;Cane - single point;Crutches;Bedside commode;Tub bench;Shower seat - built in knee scooter    Additional Comments  at least 1 railing at every flight, typically goes up stairs on all 4's due to weakness      Prior Function   Level of Independence  Independent;Independent with community mobility with device;Independent with household mobility with device;Independent with basic ADLs    Vocation  On disability    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   Overall Cognitive Status  Within Functional Limits for tasks assessed       Observation/Other Assessments   Focus on Therapeutic Outcomes (FOTO)   64% limited      Functional Tests   Functional tests  Single leg stance      Single Leg Stance   Comments  Rt LE = 9 seconds; Lt LE = 0 seconds      Posture/Postural Control   Posture/Postural Control  Postural limitations    Postural Limitations  Increased lumbar lordosis;Forward head;Rounded Shoulders;Anterior pelvic tilt      AROM   Overall AROM Comments  deferred testing due to pain, will test next session  Strength   Right Hip Flexion  4-/5    Right Hip Extension  3+/5    Right Hip ABduction  3+/5    Left Hip Flexion  3+/5    Left Hip Extension  3/5    Left Hip ABduction  3+/5    Right Knee Flexion  4+/5    Right Knee Extension  5/5    Left Knee Flexion  4-/5    Left Knee Extension  4+/5    Right Ankle Dorsiflexion  4+/5    Left Ankle Dorsiflexion  4+/5      Palpation   Spinal mobility  hypersensitive with muscle guarding to palpation and PA's along lumbar spine    Palpation comment  tenderness and hyperactive muscles gaurding with palpation to lumbar paraspinals and gluteus max/med      Transfers   Five time sit to stand comments   21.3 without UE use      Ambulation/Gait   Ambulation/Gait  Yes    Ambulation/Gait Assistance  6: Modified independent (Device/Increase time)    Ambulation Distance (Feet)  456 Feet 3MWT    Assistive device  Straight cane    Gait Pattern  Step-through pattern;Decreased step length - right;Decreased stance time - left;Decreased stride length;Decreased hip/knee flexion - right;Decreased hip/knee flexion - left;Decreased weight shift to left;Trendelenburg;Antalgic;Lateral trunk lean to left;Lateral trunk lean to right;Trunk flexed;Wide base of support excessive anterior to posterior translation of plevis    Gait velocity  0.77 m/s    Stairs  Yes    Stairs Assistance  6: Modified independent (Device/Increase time)    Stair Management Technique  Two rails;Step to  pattern;Forwards    Number of Stairs  4    Height of Stairs  6    Gait Comments  patient gait more antalgic with greater latearl trunkl lean and pelvic rotation in transverse plane; gait velocity indicates patient is at risk for falls        Objective measurements completed on examination: See above findings.      PT Education - 05/07/17 1450    Education provided  Yes    Education Details  Educated on exam findings and appropriate POC.    Person(s) Educated  Patient    Methods  Explanation    Comprehension  Verbalized understanding       PT Short Term Goals - 05/07/17 1302      PT SHORT TERM GOAL #1   Title  Patient will be independent with HEP to improve functional strength and balance.    Time  3    Period  Weeks    Status  New    Target Date  05/28/17      PT SHORT TERM GOAL #2   Title  Patient will improve MMT for Bil LE by 1/2 grade to improve functional strength for improved gait and mobility.    Time  3    Period  Weeks    Status  New      PT SHORT TERM GOAL #3   Title  Patient will perform SLS for Bil LE for 10 seconds each to demosntrate improved balance for greater ease with gait and stair ambulation.    Time  3    Period  Weeks    Status  New        PT Long Term Goals - 05/07/17 1451      PT LONG TERM GOAL #1   Title  Patient will improve MMT for Bil LE by  1 grade to improve functional strength for improved gait and mobility.    Time  6    Period  Weeks    Status  New    Target Date  06/18/17      PT LONG TERM GOAL #2   Title  Patient will ambulate at 1.0 m/s during 3 MWT to demonstrate decreased risk of falling and improve gait to place her in a safe community ambulator category.    Time  6    Period  Weeks    Status  New      PT LONG TERM GOAL #3   Title  Patient will perform 5x sit to stand in 13 seconds or less to demosntrate improved functional Bil LE strength for greater performance with daily activies.    Time  6    Period  Weeks     Status  New      PT LONG TERM GOAL #4   Title  Patient will improve FOTO score by 10% to indicate significant improvement in functiona dn decresae self reported limitaion due to low back pain.    Time  6    Period  Weeks    Status  New          Plan  Clinical Impression Statement Ms. Quinton presents for initial PT evaluation for low back pain and difficulties with gait and balance. She has a significant history of falls resulting in injury and reports worsening back pain over the last 4 years following a fall with a fracture to her left femur. Her current impairments include decreased bil LE strength, decreased balance, impaired posture, decreased activity tolerance, impaired gait and mobility, hyperactive muscles, and pain. She will benefit from skilled PT services to address current impairments and progress towards goals to improve function and overall QOL through mobility  History and Personal Factors relevant to plan of care:    Clinical Presentation Stable  Clinical Presentation due to:    Clinical Decision Making Low  Pt will benefit from skilled therapeutic intervention in order to improve on the following deficits Abnormal gait; Decreased balance; Decreased endurance; Decreased mobility; Difficulty walking; Decreased activity tolerance; Decreased strength; Increased muscle spasms; Improper body mechanics; Increased fascial restricitons; Postural dysfunction; Pain  Rehab Potential Fair  Clinical Impairments Affecting Rehab Potential chronic histpry of pain with narcotic use for management (-), Motivated (+)  PT Frequency 2x / week  PT Duration 6 weeks  PT Treatment/Interventions ADLs/Self Care Home Management; Cryotherapy; Electrical Stimulation; Moist Heat; DME Instruction; Gait training; Stair training; Balance training; Functional mobility training; Neuromuscular re-education; Therapeutic activities; Therapeutic exercise; Patient/family education; Manual techniques; Passive range  of motion; Energy conservation; Dry needling; Taping  PT Next Visit Plan Review eval and goals. Initiate core strengthening proximal LE strengthening. Initiate balance training for SLS activities.  PT Home Exercise Plan Initaite next session: clamshell, TA activaiton, posterior pelvic tilt  Recommended Other Services    Consulted and Agree with Plan of Care Patient         Patient will benefit from skilled therapeutic intervention in order to improve the following deficits and impairments:  Abnormal gait, Decreased balance, Decreased endurance, Decreased mobility, Difficulty walking, Decreased activity tolerance, Decreased strength, Increased muscle spasms, Improper body mechanics, Increased fascial restricitons, Postural dysfunction, Pain  Visit Diagnosis: Chronic bilateral low back pain without sciatica  Muscle weakness (generalized)  Other abnormalities of gait and mobility     Problem List Patient Active Problem List   Diagnosis Date Noted  .  Bunion, right 03/24/2017  . Lumbar degenerative disc disease 01/06/2017  . GERD (gastroesophageal reflux disease) 05/27/2016  . Chronic, continuous use of opioids 05/27/2016  . Fibromyalgia 05/27/2016  . Moderate episode of recurrent major depressive disorder (HCC) 02/01/2016  . 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 Osteoarthritis 08/16/2015  . Morbid obesity (HCC) 08/07/2015  . Elevated blood pressure 01/31/2014  . Arthritis 06/24/2013  . Myalgia and myositis 06/24/2013  . Chronic back pain 02/16/2013  . Depression, major, in remission (HCC) 01/05/2013  . Chronic arthralgias of knees and hips 01/05/2013  . 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 ADD (attention deficit disorder) 07/08/2012    Valentino Saxon, PT, DPT Physical Therapist with [facility]  05/08/2017 10:47 PM     [facility] [address]  [place], [place], [address] Phone: [phone]   Fax:  [phone]  Name: JAYLIN ROUNDY MRN: [id number] Date of Birth: [birth year]

## 2017-05-09 NOTE — Progress Notes (Signed)
Patient's Name: Lauren Greene  MRN: [id number]  Referring Provider: Kathyrn Drown, MD  DOB: 10-Oct-1965  PCP: Kathyrn Drown, MD  DOS: 05/08/2017  Note by: Gillis Santa, MD  Service setting: Ambulatory outpatient  Specialty: Interventional Pain Management  Location: [facility] (AMB) Pain Management Facility    Patient type: Established   Primary Reason(s) for Visit: Encounter for evaluation before starting new chronic pain management plan of care (Level of risk: moderate) CC: Back Pain (lower)  HPI  Lauren Greene is a 52 y.o. year old, female patient, who comes today for a follow-up evaluation to review the test results and decide on a treatment plan. 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 Her primarily concern today is the Back Pain (lower)  Pain Assessment: Location: Lower Back Radiating: denies Onset: More than a month ago Duration: Chronic pain Quality: Constant, Dull Severity: 6 /10 (self-reported pain score)  Note: Reported level is inconsistent with clinical observations. Clinically the patient looks like a 2/10 A 2/10 is viewed as "Mild to Moderate" and described as noticeable and distracting. Impossible to hide from other people. More frequent flare-ups. Still possible to adapt and function close to normal. It can be very annoying and may have occasional stronger flare-ups. With discipline, patients may get used to it and adapt.       When using our objective Pain Scale, levels between  6 and 10/10 are said to belong in an emergency room, as it progressively worsens from a 6/10, described as severely limiting, requiring emergency care not usually available at an outpatient pain management facility. At a 6/10 level, communication becomes difficult and requires great effort. Assistance to reach the emergency department may be required. Facial flushing and profuse sweating along with potentially dangerous increases in heart rate and blood pressure will be evident. Effect on ADL:  limits, difficulty walking extending periods of time has to rest Timing:  worse in the evening Modifying factors: ice, Percocet  Lauren Greene comes in today for a follow-up visit after her initial evaluation on 04/22/2017. Today we went over the results of her tests. These were explained in "Layman's terms". During today's appointment we went over my diagnostic impression, as well as the proposed treatment plan.  52 year old female with a history of morbid obesity who presents with cervical, lumbar, bilateral knee pain.  She states that she had knee surgery in the past after a femur fracture.  She has had bilateral total knee arthroplasty.  Patient's primary complaint is her axial low back pain.  She says that it radiates into her bilateral hips.  She finds it very difficult to walk and perform activities of daily living.  Patient ambulates using a cane today.  She does work full-time.  Patient denies any bowel or bladder dysfunction, saddle anesthesia or lower cavity weakness.  Patient returns today for follow-up to review her UDS.  Surprisingly, the patient's UDS was positive for morphine, at a very low level.  Results are below.  When I approached the patient about this, she became very tearful and adamantly claims that she did not take morphine.  She states that her  last morphine intake was prior to her appointment with orthopedic surgeon in October and that she "swears on her son" she did not take any  morphine.  In considering the treatment plan options, Lauren Greene was reminded that I no longer take patients for medication management only. I asked her to let me know if she had no intention of taking advantage of the interventional therapies, so that we could make arrangements to provide this space to someone interested. I also made it clear that undergoing interventional therapies for the purpose of getting pain medications is very inappropriate on the part of a patient, and it will not be tolerated in this practice. This type of behavior would suggest true addiction and therefore it requires referral to an addiction specialist.   Further details on both, my assessment(s), as well as the proposed treatment plan, please see below.  Controlled Substance Pharmacotherapy Assessment REMS (Risk Evaluation and Mitigation Strategy)  02/11/2017 1 02/11/2017 Oxycodone-Acetaminophen 10-325 40 7 Ma Dud [id number] Car (9744) 0 85.71 MME Comm Ins [place] Pill Count: None expected due to no prior prescriptions written by our practice. Landis Martins, RN  05/08/2017  1:51 PM  Sign at close encounter Safety precautions to be maintained throughout the outpatient stay will include: orient to surroundings, keep bed in low position, maintain call bell within reach at all times, provide assistance with transfer out of bed and ambulation.    Pharmacokinetics: Liberation and absorption (onset of action): WNL Distribution (time to peak effect): WNL Metabolism and excretion (duration of action): WNL         Pharmacodynamics: Desired effects: Analgesia: Lauren Greene reports >50% benefit. Functional ability: Patient reports that medication allows her to accomplish basic ADLs Clinically meaningful improvement in function (CMIF): Sustained CMIF goals met Perceived effectiveness: Described as relatively effective, allowing for increase in activities of daily living (ADL) Undesirable effects: Side-effects or Adverse  reactions: None reported Monitoring: [place] PMP: Online review of the past 75-monthperiod previously conducted. Not applicable at this point since we have not taken over the patient's medication management yet. List of other Serum/Urine Drug Screening Test(s):  No results found for: AMPHSCRSER, BARBSCRSER, BENZOSCRSER, COCAINSCRSER, COCAINSCRNUR, PCPSCRSER, THCSCRSER, THCU, CANNABQUANT, [place] UDS test(s) done:  Lab Results  Component Value Date   TOXASSSELUR FINAL 11/30/2015   SUMMARY FINAL 04/16/2017   Last UDS on record: ToxAssure Select 13  Date Value Ref Range Status  11/30/2015 FINAL  Final    Comment:    ==================================================================== TOXASSURE SELECT 13 (MW) ==================================================================== Test                             Result       Flag       Units Drug Present and Declared for Prescription Verification   Amphetamine                    >2674        EXPECTED   ng/mg creat    Amphetamine is available as a schedule II prescription drug.   Oxycodone                      >2674        EXPECTED   ng/mg creat   Oxymorphone                    1179  EXPECTED   ng/mg creat   Noroxycodone                   >2674        EXPECTED   ng/mg creat   Noroxymorphone                 379          EXPECTED   ng/mg creat    Sources of oxycodone are scheduled prescription medications.    Oxymorphone, noroxycodone, and noroxymorphone are expected    metabolites of oxycodone. Oxymorphone is also available as a    scheduled prescription medication. ==================================================================== Test                      Result    Flag   Units      Ref Range   Creatinine              374              mg/dL      >=20 ==================================================================== Declared Medications:  The flagging and interpretation on this report  are based on the  following declared medications.  Unexpected results may arise from  inaccuracies in the declared medications.  **Note: The testing scope of this panel includes these medications:  Amphetamine (Adderall)  Oxycodone ==================================================================== For clinical consultation, please call [phone]. ====================================================================    Summary  Date Value Ref Range Status  04/16/2017 FINAL  Final    Comment:    ==================================================================== TOXASSURE COMP DRUG ANALYSIS,UR ==================================================================== Test                             Result       Flag       Units Drug Present and Declared for Prescription Verification   Amphetamine                    >4566        EXPECTED   ng/mg creat    Amphetamine is available as a schedule II prescription drug.   Duloxetine                     PRESENT      EXPECTED   Trazodone                      PRESENT      EXPECTED   1,3 chlorophenyl piperazine    PRESENT      EXPECTED    1,3-chlorophenyl piperazine is an expected metabolite of    trazodone.   Diclofenac                     PRESENT      EXPECTED Drug Present not Declared for Prescription Verification   Morphine                       24           UNEXPECTED ng/mg creat    Potential sources of morphine include administration of codeine    or morphine, use of heroin, or ingestion of poppy seeds. Drug Absent but Declared for Prescription Verification   Ibuprofen                      Not Detected UNEXPECTED    Ibuprofen, as indicated in the  declared medication list, is not    always detected even when used as directed. ==================================================================== Test                      Result    Flag   Units      Ref Range   Creatinine              219              mg/dL       >=20 ==================================================================== Declared Medications:  The flagging and interpretation on this report are based on the  following declared medications.  Unexpected results may arise from  inaccuracies in the declared medications.  **Note: The testing scope of this panel includes these medications:  Amphetamine (Adderall XR)  Duloxetine (Cymbalta)  Trazodone (Desyrel)  **Note: The testing scope of this panel does not include small to  moderate amounts of these reported medications:  Diclofenac  Ibuprofen (Advil)  **Note: The testing scope of this panel does not include following  reported medications:  Mirabegron ==================================================================== For clinical consultation, please call [phone]. ====================================================================    UDS interpretation: Unexpected findings: Undeclared illicit substance detected Medication Assessment Form: Discrepancies found between patient's report and information collected Treatment compliance: Deficiencies noted and steps taken to remind the patient of the seriousness of adequate therapy compliance WILL REPEAT UDS TODAY Risk Assessment Profile: Aberrant behavior: See initial evaluations. None observed or detected today Comorbid factors increasing risk of overdose: See initial evaluation. No additional risks detected today Medical Psychology Evaluation: Please see scanned results in medical record. Opioid Risk Tool - 04/16/17 1156      Family History of Substance Abuse   Alcohol  Negative    Illegal Drugs  Negative    Rx Drugs  Negative      Personal History of Substance Abuse   Alcohol  Negative    Illegal Drugs  Negative    Rx Drugs  Negative      Age   Age between 27-45 years   No      History of Preadolescent Sexual Abuse   History of Preadolescent Sexual Abuse  Negative or Female      Psychological Disease    Psychological Disease  Negative    Depression  Negative      Total Score   Opioid Risk Tool Scoring  0    Opioid Risk Interpretation  Low Risk      ORT Scoring interpretation table:  Score <3 = Low Risk for SUD  Score between 4-7 = Moderate Risk for SUD  Score >8 = High Risk for Opioid Abuse   Risk Mitigation Strategies:  Patient opioid safety counseling: Not applicable. until UDS completed  Patient-Prescriber Agreement (PPA): No agreement signed.  Controlled substance notification to other providers: Not applicable  Pharmacologic Plan: Pending ordered tests and/or consults.             Laboratory Chemistry  Inflammation Markers (CRP: Acute Phase) (ESR: Chronic Phase) Lab Results  Component Value Date   CRP 1.7 08/03/2014   ESRSEDRATE 2 08/03/2014                         Rheumatology Markers Lab Results  Component Value Date   RF 12 06/24/2013   ANA Negative 08/03/2014                Renal Function Markers Lab Results  Component Value Date  BUN 13 04/15/2017   CREATININE 0.58 04/15/2017   GFRAA 123 04/15/2017   GFRNONAA 106 04/15/2017                 Hepatic Function Markers Lab Results  Component Value Date   AST 26 03/15/2017   ALT 26 03/15/2017   ALBUMIN 4.3 03/15/2017   ALKPHOS 117 03/15/2017   LIPASE 25 01/12/2016                 Electrolytes Lab Results  Component Value Date   NA 142 04/15/2017   K 4.6 04/15/2017   CL 102 04/15/2017   CALCIUM 9.5 04/15/2017                        Neuropathy Markers Lab Results  Component Value Date   HGBA1C 5.6 07/22/2014                 Bone Pathology Markers Lab Results  Component Value Date   VD25OH 31 01/07/2013                         Coagulation Parameters Lab Results  Component Value Date   INR 1.04 02/16/2013   LABPROT 13.6 02/16/2013   APTT 29 02/16/2013   PLT 274 01/12/2016                 Cardiovascular Markers Lab Results  Component Value Date   CKTOTAL 139 08/03/2014   CKMB  2.8 06/24/2013   HGB 14.6 01/12/2016   HCT 43.4 01/12/2016                 CA Markers No results found for: CEA, CA125, LABCA2               Note: Lab results reviewed.  Recent Diagnostic Imaging Review  Lumbar MR w/wo contrast:  Results for orders placed in visit on 11/07/15  MR Lumbar Spine W Wo Contrast   Narrative CLINICAL DATA:  Back pain with right lower extremity pain. Prior lumbar surgery.  EXAM: MRI LUMBAR SPINE WITHOUT AND WITH CONTRAST  TECHNIQUE: Multiplanar and multiecho pulse sequences of the lumbar spine were obtained without and with intravenous contrast.  CONTRAST:  27m MULTIHANCE GADOBENATE DIMEGLUMINE 529 MG/ML IV SOLN  COMPARISON:  Lumbar spine MRI 02/17/2014  FINDINGS: Segmentation: Normal. The lowest disc space is considered to be L5-S1.  Alignment:  Grade 1 retrolisthesis at L1-2 and L2-3, unchanged.  Vertebrae: No acute compression fracture, facet edema or focal marrow lesion.  Conus medullaris: Extends to the L1 level and appears normal.  Paraspinal and other soft tissues: The visualized aorta, IVC and iliac vessels are normal. The visualized retroperitoneal organs and paraspinal soft tissues are normal.  Disc levels:  T10-T11: Evaluated on sagittal images only. Small disc herniation without spinal Greene or neural foraminal stenosis.  T11-T12: Evaluated on sagittal images only. Small disc herniation. No spinal Greene or neural foraminal stenosis.  T12-L1: Normal disc space and facet joints. No spinal Greene stenosis. No neuroforaminal stenosis.  L1-L2: Narrowing of the intervertebral disc space with disc edema. Mild facet hypertrophy. Small disc osteophyte complex. No spinal Greene stenosis. Moderate bilateral, right worse than left, neuroforaminal stenosis. This is unchanged.  L2-L3: Diffuse disc bulge with mild narrowing of the lateral recesses. Mild unchanged spinal Greene stenosis. Moderate bilateral unchanged neuroforaminal  stenosis.  L3-L4: Diffuse disc bulge with superimposed central protrusion, unchanged. Bilateral mild facet hypertrophy. Unchanged moderate  spinal Greene stenosis. Moderate bilateral neural foraminal stenosis. This level is unchanged.  L4-L5: Diffuse disc bulge with narrowing of the left-greater-than-right lateral recesses. No spinal Greene stenosis. Mild right and moderate left neuroforaminal stenosis. This level is unchanged.  L5-S1: Left eccentric disc bulge and moderate bilateral facet hypertrophy. No spinal Greene stenosis. Moderate left neuroforaminal stenosis, unchanged.  IMPRESSION: 1. Worsened disc edema at L1-L2 with unchanged neuroforaminal stenosis, right greater than left. 2. Unchanged moderate spinal Greene stenosis at L3-L4. There is also moderate bilateral neural foraminal narrowing at this level, also unchanged. 3. Unchanged left eccentric disc bulge at L5-S1 narrowing the left neural foramen.   Electronically Signed   By: Ulyses Jarred M.D.   On: 12/01/2015 02:19     Lumbar DG (Complete) 4+V:  Results for orders placed in visit on 06/21/15  DG Lumbar Spine Complete   Narrative CLINICAL DATA:  Chronic back pain with recent worsening on the right. No radiculopathy, no recent injury.  EXAM: LUMBAR SPINE - COMPLETE 4+ VIEW  COMPARISON:  MR lumbar spine 06/30/2008.  FINDINGS: There is levoconvex curvature centered at L1-2. Associated minimal retrolisthesis of L1 on L2, with advanced secondary degenerative disc disease. Multilevel endplate degenerative changes are seen in the thoracolumbar spine with associated disc space narrowing. Disc space height at L3-4 is relatively maintained. Multilevel facet hypertrophy. No definite pars defects.  IMPRESSION: Advanced multilevel degenerative disc disease.   Electronically Signed   By: Lorin Picket M.D.   On: 07/07/2015 08:12     Lumbar DG Epidurogram OP:  Results for orders placed during the hospital  encounter of 11/10/08  DG Epidurography   Narrative Clinical Data:  Low back pain.  Good response to previous lumbar epidurals.  Ovary exertion with recurrence of pain, more on the right than the left presently.   Procedure: The procedure, risks, benefits, and alternatives were explained to the patient. Questions regarding the procedure were encouraged and answered. The patient understands and consents to the procedure.   LUMBAR EPIDURAL INJECTION: An interlaminar approach was performed on the right at L4-5.  The overlying skin was cleansed with Betadine, draped in sterile fashion, and anesthetized using 1% Lidocaine.  A 4.5 inch 20 gauge spinal needle was advanced using loss-of-resistance technique.   DIAGNOSTIC EPIDURAL INJECTION: Injection of Omnipaque 180 shows a good epidural pattern with spread above and below the level of needle placement, primarily on the right, but the both sides. No vascular opacification is seen.  Pattern of spread seems normal.   THERAPEUTIC EPIDURAL INJECTION: 142m of Depo-Medrol mixed with 3 ml 1% lidocaine were instilled.  The procedure was well-tolerated, and the patient was discharged thirty minutes following the injection in good condition.   Fluoro time:  1 minute 6 seconds   IMPRESSION: Technically successful thirdepidural injection on the right at L4-5.  Provider: RAlfredo Batty    Epidurography 1:  Results for orders placed during the hospital encounter of 11/10/08  DG Epidurography   Narrative Clinical Data:  Low back pain.  Good response to previous lumbar epidurals.  Ovary exertion with recurrence of pain, more on the right than the left presently.   Procedure: The procedure, risks, benefits, and alternatives were explained to the patient. Questions regarding the procedure were encouraged and answered. The patient understands and consents to the procedure.   LUMBAR EPIDURAL INJECTION: An interlaminar approach was performed on  the right at L4-5.  The overlying skin was cleansed with Betadine, draped in sterile fashion, and anesthetized using  1% Lidocaine.  A 4.5 inch 20 gauge spinal needle was advanced using loss-of-resistance technique.   DIAGNOSTIC EPIDURAL INJECTION: Injection of Omnipaque 180 shows a good epidural pattern with spread above and below the level of needle placement, primarily on the right, but the both sides. No vascular opacification is seen.  Pattern of spread seems normal.   THERAPEUTIC EPIDURAL INJECTION: 174m of Depo-Medrol mixed with 3 ml 1% lidocaine were instilled.  The procedure was well-tolerated, and the patient was discharged thirty minutes following the injection in good condition.   Fluoro time:  1 minute 6 seconds   IMPRESSION: Technically successful thirdepidural injection on the right at L4-5.  Provider: RAlfredo Batty   Knee-L DG 4 views:  Results for orders placed during the hospital encounter of 08/27/12  DG Knee Complete 4 Views Left   Narrative *RADIOLOGY REPORT*  Clinical Data: Fall with knee pain.  LEFT KNEE - COMPLETE 4+ VIEW  Comparison: None  Findings: Left total knee replacement identified without hardware complicating features.  A mildly comminuted oblique fracture of the distal femoral diaphysis is identified with 1 cm posterior and lateral displacement. There is no evidence of subluxation or dislocation. No focal bony lesions are present.  IMPRESSION: Mildly comminuted oblique distal femoral fracture as described.   Original Report Authenticated By: JMargarette Canada M.D.     Complexity Note: Imaging results reviewed. Results shared with Lauren Greene using Layman's terms.                         Meds   Current Outpatient Medications:  .  amphetamine-dextroamphetamine (ADDERALL XR) 30 MG 24 hr capsule, 2 qam, Disp: 60 capsule, Rfl: 0 .  Diclofenac Sodium CR 100 MG 24 hr tablet, TAKE 1 TABLET BY MOUTH TWICE DAILY. TAKE WITH FOOD., Disp: 60  tablet, Rfl: 6 .  DULoxetine (CYMBALTA) 60 MG capsule, Take 2 capsules (120 mg total) by mouth daily., Disp: 180 capsule, Rfl: 0 .  ibuprofen (ADVIL,MOTRIN) 800 MG tablet, Take 1 tablet (800 mg total) by mouth 2 (two) times daily as needed., Disp: 60 tablet, Rfl: 2 .  mirabegron ER (MYRBETRIQ) 50 MG TB24 tablet, Take 1 tablet (50 mg total) by mouth daily., Disp: 30 tablet, Rfl: 1 .  omeprazole (PRILOSEC) 40 MG capsule, Take 40 mg by mouth daily., Disp: , Rfl:  .  traZODone (DESYREL) 100 MG tablet, Take 100 mg by mouth at bedtime. 2 tablets at bedtimr, Disp: , Rfl:   ROS  Constitutional: Denies any fever or chills Gastrointestinal: No reported hemesis, hematochezia, vomiting, or acute GI distress Musculoskeletal: Denies any acute onset joint swelling, redness, loss of ROM, or weakness Neurological: No reported episodes of acute onset apraxia, aphasia, dysarthria, agnosia, amnesia, paralysis, loss of coordination, or loss of consciousness  Allergies  Lauren Greene allergic to wellbutrin [bupropion] and lyrica [pregabalin].  [place] Drug: Lauren Greene reports that she does not use drugs. Alcohol:  reports that she drinks alcohol. Tobacco:  reports that she quit smoking about 16 years ago. Her smoking use included cigarettes. She quit after 10.00 years of use. she has never used smokeless tobacco. Medical:  has a past medical history of ADHD (attention deficit hyperactivity disorder), Arthritis, Back pain, [place]), Migraine, and Osteoarthritis. Surgical: Lauren Greene has a past surgical history that includes Total knee arthroplasty; Heel spur surgery; Back surgery; IM nailing femoral shaft fracture (Left, 08/27/2012); Femur IM nail (Left, 08/27/2012); Cervical biopsy w/  loop electrode excision (1990's); Endometrial biopsy (04-05-05 and 02-12-06); Tubal ligation; Shoulder surgery (Left); and Vaginal hysterectomy. Family: family history includes Bipolar disorder in her mother and  sister; Breast cancer in her mother; Cancer in her father, mother, and other; Heart failure in her father and other.  Constitutional Exam  General appearance: Well nourished, well developed, and well hydrated. In no apparent acute distress Vitals:   05/08/17 1349  BP: (!) 172/94  Pulse: 72  Resp: 16  Temp: 98.6 F (37 C)  TempSrc: Oral  SpO2: 100%  Weight: 249 lb (112.9 kg)  Height: '5\' 6"'$  (1.676 m)   BMI Assessment: Estimated body mass index is 40.19 kg/m as calculated from the following:   Height as of this encounter: '5\' 6"'$  (1.676 m).   Weight as of this encounter: 249 lb (112.9 kg).  BMI interpretation table: BMI level Category Range association with higher incidence of chronic pain  <18 kg/m2 Underweight   18.5-24.9 kg/m2 Ideal body weight   25-29.9 kg/m2 Overweight Increased incidence by 20%  30-34.9 kg/m2 Obese (Class I) Increased incidence by 68%  35-39.9 kg/m2 Severe obesity (Class II) Increased incidence by 136%  >40 kg/m2 Extreme obesity (Class III) Increased incidence by 254%   BMI Readings from Last 4 Encounters:  05/08/17 40.19 kg/m  04/16/17 40.19 kg/m  04/15/17 42.50 kg/m  04/14/17 39.59 kg/m   Wt Readings from Last 4 Encounters:  05/08/17 249 lb (112.9 kg)  04/16/17 249 lb (112.9 kg)  04/15/17 247 lb 9.6 oz (112.3 kg)  04/14/17 249 lb (112.9 kg)  Psych/Mental status: Alert, oriented x 3 (person, place, & time)       Eyes: PERLA Respiratory: No evidence of acute respiratory distress  Cervical Spine Area Exam  Skin & Axial Inspection: No masses, redness, edema, swelling, or associated skin lesions Alignment: Symmetrical Functional ROM: Unrestricted ROM      Stability: No instability detected Muscle Tone/Strength: Functionally intact. No obvious neuro-muscular anomalies detected. Sensory (Neurological): Unimpaired Palpation: No palpable anomalies              Upper Extremity (UE) Exam    Side: Right upper extremity  Side: Left upper extremity   Skin & Extremity Inspection: Skin color, temperature, and hair growth are WNL. No peripheral edema or cyanosis. No masses, redness, swelling, asymmetry, or associated skin lesions. No contractures.  Skin & Extremity Inspection: Skin color, temperature, and hair growth are WNL. No peripheral edema or cyanosis. No masses, redness, swelling, asymmetry, or associated skin lesions. No contractures.  Functional ROM: Unrestricted ROM          Functional ROM: Unrestricted ROM          Muscle Tone/Strength: Functionally intact. No obvious neuro-muscular anomalies detected.  Muscle Tone/Strength: Functionally intact. No obvious neuro-muscular anomalies detected.  Sensory (Neurological): Unimpaired          Sensory (Neurological): Unimpaired          Palpation: No palpable anomalies              Palpation: No palpable anomalies              Specialized Test(s): Deferred         Specialized Test(s): Deferred          Thoracic Spine Area Exam  Skin & Axial Inspection: No masses, redness, or swelling Alignment: Symmetrical Functional ROM: Unrestricted ROM Stability: No instability detected Muscle Tone/Strength: Functionally intact. No obvious neuro-muscular anomalies detected. Sensory (Neurological): Unimpaired Muscle strength & Tone:  No palpable anomalies  Lumbar Spine Area Exam  Skin & Axial Inspection: No masses, redness, or swelling Alignment: Symmetrical Functional ROM: Decreased ROM, bilaterally Stability: No instability detected Muscle Tone/Strength: Functionally intact. No obvious neuro-muscular anomalies detected. Sensory (Neurological): Articular pain pattern Palpation: No palpable anomalies       Provocative Tests: Lumbar Hyperextension and rotation test: Positive bilaterally for facet joint pain. Lumbar Lateral bending test: Positive ipsilateral radicular pain, bilaterally. Positive for bilateral foraminal stenosis. Patrick's Maneuver: Positive for bilateral S-I arthralgia               Gait & Posture Assessment  Ambulation: Patient ambulates using a cane Gait: Antalgic Posture: Difficulty standing up straight, due to pain   Lower Extremity Exam    Side: Right lower extremity  Side: Left lower extremity  Skin & Extremity Inspection: Evidence of prior arthroplastic surgery  Skin & Extremity Inspection: Evidence of prior arthroplastic surgery  Functional ROM: Decreased ROM for knee joint  Functional ROM: Decreased ROM for knee joint  Muscle Tone/Strength: Functionally intact. No obvious neuro-muscular anomalies detected.  Muscle Tone/Strength: Functionally intact. No obvious neuro-muscular anomalies detected.  Sensory (Neurological): Arthropathic arthralgia  Sensory (Neurological): Arthropathic arthralgia  Palpation: No palpable anomalies  Palpation: No palpable anomalies    Assessment & Plan  Primary Diagnosis & Pertinent Problem List: The primary encounter diagnosis was Lumbar spondylosis. Diagnoses of Lumbar degenerative disc disease, Lumbar facet arthropathy, Fibromyalgia, and Chronic pain syndrome were also pertinent to this visit.  Visit Diagnosis: 1. Lumbar spondylosis   2. Lumbar degenerative disc disease   3. Lumbar facet arthropathy   4. Fibromyalgia   5. Chronic pain syndrome    General Recommendations: The pain condition that the patient suffers from is best treated with a multidisciplinary approach that involves an increase in physical activity to prevent de-conditioning and worsening of the pain cycle, as well as psychological counseling (formal and/or informal) to address the co-morbid psychological affects of pain. Treatment will often involve judicious use of pain medications and interventional procedures to decrease the pain, allowing the patient to participate in the physical activity that will ultimately produce long-lasting pain reductions. The goal of the multidisciplinary approach is to return the patient to a higher level of overall  function and to restore their ability to perform activities of daily living.   52 year old female with a history of morbid obesity, chronic back pain, bilateral knee pain secondary to bilateral total knee arthroplasty, back pain secondary to lumbar degenerative disc disease, multilevel lumbar disc herniations, facet arthropathy along with a diagnosis of fibromyalgia.  Patient does have a history of a broken femur in June 2014.  She is status post left shoulder surgery status post bilateral total knee arthroplasty.  She also has an intramedullary nail in her femoral shaft.  Patient has been on chronic opioid therapy in the past with Percocet 10 mg up to 5 times a day (quantity 150).  Her last prescription of this medication was in November.  She was previously seeing 2 other pain management clinics: Dr. Vira Blanco and Dr. Luan Pulling in the past.  She was discharged from Dr. Luan Pulling clinic due to being honest about her medication intake. Furthermore she does have one documented urine drug screen at her last appointment with orthopedic surgery that was positive for morphine.  Patient returns today for follow-up to review her UDS.  Surprisingly, the patient's UDS was positive for morphine, at a very low level.  Results are below.  When I approached the patient  about this, she became very tearful and adamantly claims that she did not take morphine.  She states that her last morphine intake was prior to her appointment with orthopedic surgeon in October and that she "swears on her son" she did not take any morphine. Will repeat UDS, should be NEGATIVE for morphine. Encouraged patient to continue PT and care with psychiatrist. We also discussed dieting and healthy eating and the importance of weight loss in the management of her overall pain condition.   PLAN:  -repeat UDS- NEGATIVE FOR ANY OPIOIDS -Ibuprofen as below -Continue with PT -Continue care with Psychiatrist  Future considerations for this patient would  include bilateral genicular nerve block, lumbar facet medial branch blocks at L3/4, L4/5 and L5/S1.    Plan of Care  Pharmacotherapy (Medications Ordered): Meds ordered this encounter  Medications  . ibuprofen (ADVIL,MOTRIN) 800 MG tablet    Sig: Take 1 tablet (800 mg total) by mouth 2 (two) times daily as needed.    Dispense:  60 tablet    Refill:  2   Lab-work, procedure(s), and/or referral(s): Orders Placed This Encounter  Procedures  . Compliance Drug Analysis, Ur    Provider-requested follow-up: Return in about 12 days (around 05/20/2017) for Medication Management. Time Note: Greater than 50% of the 15 minute(s) of face-to-face time spent with Ms. Dewing, was spent in counseling/coordination of care regarding: UDS screen and clinic policy when it comes to chronic opioid therapy. Future Appointments  Date Time Provider [place]  05/12/2017  2:30 PM Teena Irani, PTA [facility] None  05/14/2017  2:30 PM Teena Irani, PTA [facility] None  05/20/2017 10:30 AM Gillis Santa, MD [facility] None  07/08/2017  3:30 PM Norman Clay, MD [facility] None  07/11/2017  3:00 PM Kathyrn Drown, MD [facility] [place]  06/23/2018  2:00 PM Megan Salon, MD [place] None    Primary Care Physician: Kathyrn Drown, MD Location: [facility] Note by: Gillis Santa, M.D Date: 05/08/2017; Time: 9:19 AM  Patient Instructions  You were given one prescription for Ibuprofen today.

## 2017-05-12 ENCOUNTER — Ambulatory Visit (HOSPITAL_COMMUNITY): Payer: PPO | Admitting: Physical Therapy

## 2017-05-12 ENCOUNTER — Telehealth (HOSPITAL_COMMUNITY): Payer: Self-pay | Admitting: Physical Therapy

## 2017-05-12 NOTE — Telephone Encounter (Signed)
Pt has another MD apptment and can not come in today

## 2017-05-13 ENCOUNTER — Encounter: Payer: Self-pay | Admitting: Student in an Organized Health Care Education/Training Program

## 2017-05-14 ENCOUNTER — Telehealth: Payer: Self-pay | Admitting: *Deleted

## 2017-05-14 ENCOUNTER — Ambulatory Visit (HOSPITAL_COMMUNITY): Payer: PPO | Admitting: Physical Therapy

## 2017-05-14 DIAGNOSIS — M545 Low back pain, unspecified: Secondary | ICD-10-CM

## 2017-05-14 DIAGNOSIS — G8929 Other chronic pain: Secondary | ICD-10-CM

## 2017-05-14 DIAGNOSIS — R2689 Other abnormalities of gait and mobility: Secondary | ICD-10-CM

## 2017-05-14 DIAGNOSIS — M6281 Muscle weakness (generalized): Secondary | ICD-10-CM

## 2017-05-14 LAB — COMPLIANCE DRUG ANALYSIS, UR

## 2017-05-14 NOTE — Therapy (Addendum)
[facility] [address] [place], [place], [address] Phone: [phone]   Fax:  [phone]  Physical Therapy Treatment  Patient Details  Name: Lauren Greene MRN: [id number] Date of Birth: October 20, 1965 Referring Provider: Edward Jolly, MD   Encounter Date: 05/14/2017  PT End of Session - 05/14/17 1522    Visit Number  2    Number of Visits  4    Date for PT Re-Evaluation  05/28/17    Authorization Type  [employer] (Medicaid Secondary)    Authorization Time Period  cert: 4/40/10 - 06/18/17; medicaid auth in for initial (will put in re-auth when first one is approved)    Authorization - Visit Number  2    Authorization - Number of Visits  4    PT Start Time  1440 pt was late for appt    PT Stop Time  1515    PT Time Calculation (min)  35 min    Activity Tolerance  Patient tolerated treatment well;No increased pain    Behavior During Therapy  WFL for tasks assessed/performed       Past Medical History:  Diagnosis Date  . ADHD (attention deficit hyperactivity disorder)   . Arthritis   . Back pain   . Broken femur (HCC) 08/27/12  . Migraine   . Osteoarthritis     Past Surgical History:  Procedure Laterality Date  . BACK SURGERY    . CERVICAL BIOPSY  W/ LOOP ELECTRODE EXCISION  1990's   in [place], [place]  . ENDOMETRIAL BIOPSY  04-05-05 and 02-12-06   04-05-05 benign but too rapid growth. RXd Provera and repeat Bx 66mo. 02-12-06 Bx benign--Dr. Leda Quail  . FEMUR IM NAIL Left 08/27/2012   Procedure: INTRAMEDULLARY (IM) RETROGRADE FEMORAL NAILING;  Surgeon: Kathryne Hitch, MD;  Location: [facility] OR;  Service: Orthopedics;  Laterality: Left;  . HEEL SPUR SURGERY    . IM NAILING FEMORAL SHAFT FRACTURE Left 08/27/2012   Dr Magnus Ivan  . SHOULDER SURGERY Left   . TOTAL KNEE ARTHROPLASTY     x2  . TUBAL LIGATION    . VAGINAL HYSTERECTOMY      There were no vitals filed for this visit.  Subjective Assessment - 05/14/17  1457    Subjective  Pt states she is doing well today.  Reports she is constantly at 6/10 even with the pain meds.     Pertinent History  Femur fracture with fixation 4 years ago, fall on stairs ~10 years ago resulting in back surgery, lumbar injections, Bil TKA 5-6 years ago, has taken percocet for pain for 20+ years    Currently in Pain?  Yes    Pain Score  6     Pain Location  Back    Pain Orientation  Lower    Pain Descriptors / Indicators  Constant                      [facility] Adult PT Treatment/Exercise - 05/14/17 0001      Knee/Hip Exercises: Seated   Sit to Sand  --      Knee/Hip Exercises: Supine   Bridges  Both;10 reps    Straight Leg Raises  Both;10 reps      Knee/Hip Exercises: Sidelying   Hip ABduction  Both;10 reps    Clams  both 10 reps 5" holds      Knee/Hip Exercises: Prone   Hamstring Curl  10 reps    Other  Prone Exercises  heelsqueeze 10X5"             PT Education - 05/14/17 1510    Education provided  Yes    Education Details  reveiwed goals and issued a HEP for proximal and core sterngthening    Person(s) Educated  Patient    Methods  Explanation;Demonstration;Tactile cues;Verbal cues;Handout    Comprehension  Verbalized understanding;Returned demonstration;Verbal cues required;Tactile cues required;Need further instruction       PT Short Term Goals - 05/07/17 1302      PT SHORT TERM GOAL #1   Title  Patient will be independent with HEP to improve functional strength and balance.    Time  3    Period  Weeks    Status  New    Target Date  05/28/17      PT SHORT TERM GOAL #2   Title  Patient will improve MMT for Bil LE by 1/2 grade to improve functional strength for improved gait and mobility.    Time  3    Period  Weeks    Status  New      PT SHORT TERM GOAL #3   Title  Patient will perform SLS for Bil LE for 10 seconds each to demosntrate improved balance for greater ease with gait and stair ambulation.    Time  3     Period  Weeks    Status  New        PT Long Term Goals - 05/07/17 1451      PT LONG TERM GOAL #1   Title  Patient will improve MMT for Bil LE by 1 grade to improve functional strength for improved gait and mobility.    Time  6    Period  Weeks    Status  New    Target Date  06/18/17      PT LONG TERM GOAL #2   Title  Patient will ambulate at 1.0 m/s during 3 MWT to demonstrate decreased risk of falling and improve gait to place her in a safe community ambulator category.    Time  6    Period  Weeks    Status  New      PT LONG TERM GOAL #3   Title  Patient will perform 5x sit to stand in 13 seconds or less to demosntrate improved functional Bil LE strength for greater performance with daily activies.    Time  6    Period  Weeks    Status  New      PT LONG TERM GOAL #4   Title  Patient will improve FOTO score by 10% to indicate significant improvement in functiona dn decresae self reported limitaion due to low back pain.    Time  6    Period  Weeks    Status  New            Plan - 05/14/17 1523    Clinical Impression Statement  PT was late for appt today.  Reveiwed goals per initial evaluation and intiated HEP to help strengthen LE's and core today.  PT able to complete most without difficulty.  Required AAROM with hamstring curls as unable to complete without foot falling inward due to weakness.  Unable to complete SLR in prone as too weak.  Began with heelsqueezes and bridge to help strengthen glutes.  PT able to complete all exercises without increase in pain.  Instructed to complete exericses 2X daily.  Pt requesting to  only come 1X week right now due to financial reasons.      Rehab Potential  Fair    Clinical Impairments Affecting Rehab Potential  chronic histpry of pain with narcotic use for management (-), Motivated (+)    PT Frequency  2x / week    PT Duration  6 weeks    PT Treatment/Interventions  ADLs/Self Care Home Management;Cryotherapy;Electrical  Stimulation;Moist Heat;DME Instruction;Gait training;Stair training;Balance training;Functional mobility training;Neuromuscular re-education;Therapeutic activities;Therapeutic exercise;Patient/family education;Manual techniques;Passive range of motion;Energy conservation;Dry needling;Taping    PT Next Visit Plan  Progress core and proximal LE strengthening. Initiate balance training for SLS activities.  Next session begin forward lunges, standing extension, static balance actvities and sit to stand.      PT Home Exercise Plan  2/27:  clamshell, TA activaiton, posterior pelvic tilt, side lying hip abduction, side lying clams,     Consulted and Agree with Plan of Care  Patient       Patient will benefit from skilled therapeutic intervention in order to improve the following deficits and impairments:  Abnormal gait, Decreased balance, Decreased endurance, Decreased mobility, Difficulty walking, Decreased activity tolerance, Decreased strength, Increased muscle spasms, Improper body mechanics, Increased fascial restricitons, Postural dysfunction, Pain  Visit Diagnosis: Chronic bilateral low back pain without sciatica  Muscle weakness (generalized)  Other abnormalities of gait and mobility     Problem List Patient Active Problem List   Diagnosis Date Noted  . Bunion, right 03/24/2017  . Lumbar degenerative disc disease 01/06/2017  . GERD (gastroesophageal reflux disease) 05/27/2016  . Chronic, continuous use of opioids 05/27/2016  . Fibromyalgia 05/27/2016  . Moderate episode of recurrent major depressive disorder (HCC) 02/01/2016  . 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 Osteoarthritis 08/16/2015  . Morbid obesity (HCC) 08/07/2015  . Elevated blood pressure 01/31/2014  . Arthritis 06/24/2013  . Myalgia and myositis 06/24/2013  . Chronic back pain 02/16/2013  . Depression, major, in remission (HCC) 01/05/2013  . Chronic arthralgias of knees and hips 01/05/2013  . Periprosthetic fracture  around internal prosthetic left knee joint 08/27/2012  . ADD (attention deficit disorder) 07/08/2012   Lurena NidaAmy B Gillie Crisci, PTA/CLT [phone]  Lurena NidaFrazier, Kiona Blume B 05/14/2017, 3:48 PM  [facility] [address] [place], [place], [address] Phone: [phone]   Fax:  [phone]  Name: Lauren Greene MRN: [id number] Date of Birth: 07/24/1965

## 2017-05-14 NOTE — Patient Instructions (Signed)
Hamstrings    Lie on stomach with ____ pound weight around left ankle. Bend same knee __90__ degrees, pointing toes toward knee. Repeat _10_ times. Do _2_ sessions per day. CAUTION: Move slowly.  Abduction: Clam (Eccentric) - Side-Lying    Lie on side with knees bent. Lift top knee, keeping feet together. Keep trunk steady. Slowly lower for 3-5 seconds. _10__ reps per set, _2__ sets per day.   Abduction: Side Leg Lift (Eccentric) - Side-Lying    Lie on side. Lift top leg slightly higher than shoulder level. Keep top leg straight with body, toes pointing forward. Slowly lower for 3-5 seconds. _10__ reps per set   Straight Leg Raise    Tighten stomach and slowly raise locked right leg __15_ inches from floor. Repeat _10_ times per set. Do _10_ reps per session. Do _2_ sessions per day.   Bridge    Lie back, legs bent. Inhale, pressing hips up. Keeping ribs in, lengthen lower back. Repeat _10_ times. Do _2_ sessions per day.

## 2017-05-14 NOTE — Telephone Encounter (Signed)
Patient called and informed UDS was negative, and to keep her follow-up appointment.

## 2017-05-20 ENCOUNTER — Other Ambulatory Visit: Payer: Self-pay

## 2017-05-20 ENCOUNTER — Ambulatory Visit
Payer: PPO | Attending: Student in an Organized Health Care Education/Training Program | Admitting: Student in an Organized Health Care Education/Training Program

## 2017-05-20 ENCOUNTER — Encounter: Payer: Self-pay | Admitting: Student in an Organized Health Care Education/Training Program

## 2017-05-20 VITALS — BP 137/86 | HR 75 | Temp 98.3°F | Resp 16 | Ht 66.0 in | Wt 248.0 lb

## 2017-05-20 DIAGNOSIS — M25551 Pain in right hip: Secondary | ICD-10-CM | POA: Diagnosis not present

## 2017-05-20 DIAGNOSIS — M545 Low back pain: Secondary | ICD-10-CM | POA: Diagnosis present

## 2017-05-20 DIAGNOSIS — K219 Gastro-esophageal reflux disease without esophagitis: Secondary | ICD-10-CM | POA: Insufficient documentation

## 2017-05-20 DIAGNOSIS — M5126 Other intervertebral disc displacement, lumbar region: Secondary | ICD-10-CM | POA: Diagnosis not present

## 2017-05-20 DIAGNOSIS — M25561 Pain in right knee: Secondary | ICD-10-CM | POA: Diagnosis not present

## 2017-05-20 DIAGNOSIS — Z79891 Long term (current) use of opiate analgesic: Secondary | ICD-10-CM | POA: Insufficient documentation

## 2017-05-20 DIAGNOSIS — M25562 Pain in left knee: Secondary | ICD-10-CM | POA: Insufficient documentation

## 2017-05-20 DIAGNOSIS — F909 Attention-deficit hyperactivity disorder, unspecified type: Secondary | ICD-10-CM | POA: Insufficient documentation

## 2017-05-20 DIAGNOSIS — Z6841 Body Mass Index (BMI) 40.0 and over, adult: Secondary | ICD-10-CM | POA: Insufficient documentation

## 2017-05-20 DIAGNOSIS — G8929 Other chronic pain: Secondary | ICD-10-CM | POA: Diagnosis not present

## 2017-05-20 DIAGNOSIS — M48061 Spinal stenosis, lumbar region without neurogenic claudication: Secondary | ICD-10-CM | POA: Insufficient documentation

## 2017-05-20 DIAGNOSIS — M2578 Osteophyte, vertebrae: Secondary | ICD-10-CM | POA: Diagnosis not present

## 2017-05-20 DIAGNOSIS — M47816 Spondylosis without myelopathy or radiculopathy, lumbar region: Secondary | ICD-10-CM | POA: Diagnosis not present

## 2017-05-20 DIAGNOSIS — M51369 Other intervertebral disc degeneration, lumbar region without mention of lumbar back pain or lower extremity pain: Secondary | ICD-10-CM

## 2017-05-20 DIAGNOSIS — M21611 Bunion of right foot: Secondary | ICD-10-CM | POA: Diagnosis not present

## 2017-05-20 DIAGNOSIS — M1288 Other specific arthropathies, not elsewhere classified, other specified site: Secondary | ICD-10-CM | POA: Insufficient documentation

## 2017-05-20 DIAGNOSIS — R61 Generalized hyperhidrosis: Secondary | ICD-10-CM | POA: Insufficient documentation

## 2017-05-20 DIAGNOSIS — Z79899 Other long term (current) drug therapy: Secondary | ICD-10-CM | POA: Insufficient documentation

## 2017-05-20 DIAGNOSIS — M25511 Pain in right shoulder: Secondary | ICD-10-CM | POA: Diagnosis present

## 2017-05-20 DIAGNOSIS — F329 Major depressive disorder, single episode, unspecified: Secondary | ICD-10-CM | POA: Diagnosis not present

## 2017-05-20 DIAGNOSIS — M25552 Pain in left hip: Secondary | ICD-10-CM | POA: Insufficient documentation

## 2017-05-20 DIAGNOSIS — M5136 Other intervertebral disc degeneration, lumbar region: Secondary | ICD-10-CM | POA: Insufficient documentation

## 2017-05-20 DIAGNOSIS — Z87891 Personal history of nicotine dependence: Secondary | ICD-10-CM | POA: Diagnosis not present

## 2017-05-20 DIAGNOSIS — M797 Fibromyalgia: Secondary | ICD-10-CM | POA: Diagnosis not present

## 2017-05-20 DIAGNOSIS — Z96653 Presence of artificial knee joint, bilateral: Secondary | ICD-10-CM | POA: Insufficient documentation

## 2017-05-20 DIAGNOSIS — M25512 Pain in left shoulder: Secondary | ICD-10-CM | POA: Diagnosis present

## 2017-05-20 MED ORDER — BUPRENORPHINE 10 MCG/HR TD PTWK: 10.0000 ug | MEDICATED_PATCH | TRANSDERMAL | 0 refills | Status: DC

## 2017-05-20 NOTE — Progress Notes (Signed)
Patient's Name: Lauren Greene  MRN: [id number]  Referring Provider: Kathyrn Drown, MD  DOB: [birth year]  PCP: Kathyrn Drown, MD  DOS: 05/20/2017  Note by: Lauren Santa, MD  Service setting: Ambulatory outpatient  Specialty: Interventional Pain Management  Location: [facility] (AMB) Pain Management Facility    Patient type: Established   Primary Reason(s) for Visit: Encounter for prescription drug management. (Level of risk: moderate)  CC: Back Pain (lower) and Shoulder Pain (bilaterally, left is worse)  HPI  Lauren Greene is a 52 y.o. year old, female patient, who comes today for a medication management evaluation. 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 Her primarily concern today is the Back Pain (lower) and Shoulder Pain (bilaterally, left is worse)  Pain Assessment: Location: Lower Back Radiating: denies Onset: More than a month ago Duration:   Quality: Aching, Constant Severity: 7 /10 (self-reported pain score)  Note: Reported level is inconsistent with clinical observations. Clinically the patient looks like a 3/10 A 3/10 is viewed as "Moderate" and described as significantly interfering with activities of daily living (ADL). It becomes difficult to feed, bathe, get dressed, get on and off the toilet or to perform personal hygiene functions. Difficult to get in and out of bed or a chair without assistance. Very distracting. With effort, it can be ignored when deeply involved in activities.        When using our objective Pain Scale, levels between 6 and 10/10 are said to belong in an emergency room, as it progressively worsens from a 6/10, described as severely limiting, requiring emergency care not usually available at an outpatient pain management facility. At a 6/10 level, communication becomes difficult and requires great effort. Assistance to reach the emergency department may be required. Facial flushing and profuse sweating along with potentially dangerous increases in heart rate and blood pressure will be evident. Effect on ADL: household chores and yardwork requiring bending hurts Timing: Constant Modifying factors: percocet has helped in the past, ice packs  Lauren Greene was last scheduled for an appointment on 05/08/2017 for medication management. During today's appointment we reviewed Lauren Greene's chronic pain status, as well as her outpatient medication regimen.  Patient presents today for follow-up.  Her repeat urine drug screen was appropriate.  No opioid medications detected.  We will trial Butrans.  The patient  reports that she does not use drugs. Her body mass index is 40.03 kg/m.  Further details on both, my assessment(s), as well as the proposed treatment plan, please see below.  Controlled Substance Pharmacotherapy Assessment REMS (Risk Evaluation and Mitigation Strategy)  Analgesic: Start Butrans today, 10 mcg an hour. MME/day: Less than 20 mg/day.  Lauren Patience, RN  05/20/2017 11:02 AM  Sign at close encounter Safety precautions to be maintained throughout the outpatient stay will include: orient to surroundings, keep bed in low position, maintain call bell within reach at all times, provide assistance with transfer out of bed and ambulation.    Pharmacokinetics: Liberation and absorption (onset of action): WNL Distribution (time to peak effect): WNL Metabolism and excretion (duration of action): WNL  Pharmacodynamics: Desired  effects: Analgesia: Lauren Greene reports >50% benefit. Functional ability: Patient reports that medication allows her to accomplish basic ADLs Clinically meaningful improvement in function (CMIF): Sustained CMIF goals met Perceived effectiveness: Described as relatively effective, allowing for increase in activities of daily living (ADL) Undesirable effects: Side-effects or Adverse reactions: None reported Monitoring: [place] PMP: Online review of the past 31-monthperiod conducted. Compliant with practice rules and regulations Last UDS on record: Summary  Date Value Ref Range Status  05/08/2017 FINAL  Final    Comment:    ==================================================================== TOXASSURE COMP DRUG ANALYSIS,UR ==================================================================== Test                             Result       Flag       Units Drug Present and Declared for Prescription Verification   Amphetamine                    >7143        EXPECTED   ng/mg creat    Amphetamine is available as a schedule II prescription drug.   Duloxetine                     PRESENT      EXPECTED   Trazodone                      PRESENT      EXPECTED   1,3 chlorophenyl piperazine    PRESENT      EXPECTED    1,3-chlorophenyl piperazine is an expected metabolite of    trazodone.   Diclofenac                     PRESENT      EXPECTED Drug Absent but Declared for Prescription Verification   Ibuprofen                      Not Detected UNEXPECTED    Ibuprofen, as indicated in the declared medication list, is not    always detected even when used as directed. ==================================================================== Test                      Result    Flag   Units      Ref Range   Creatinine              140              mg/dL      >=20 ==================================================================== Declared Medications:  The flagging and interpretation on this report are based on  the  following declared medications.  Unexpected results may arise from  inaccuracies in the declared medications.  **Note: The testing scope of this panel includes these medications:  Amphetamine (Adderall XR)  Duloxetine (Cymbalta)  Trazodone (Desyrel)  **Note: The testing scope of this panel does not include small to  moderate amounts of these reported medications:  Diclofenac  Ibuprofen  **Note: The testing scope of this panel does not include following  reported medications:  Mirabegron (Myrbetriq)  Omeprazole (Prilosec) ==================================================================== For clinical consultation, please call ([phone] ====================================================================    UDS interpretation: Compliant          Medication Assessment Form: Not applicable. Initial evaluation. The patient has not received any medications from our practice Treatment compliance: Not applicable. Initial evaluation for opioid therapy. Risk  Assessment Profile: Aberrant behavior: See prior evaluations. None observed or detected today Comorbid factors increasing risk of overdose: See prior notes. No additional risks detected today Risk of substance use disorder (SUD): Low Opioid Risk Tool - 05/20/17 1111      Family History of Substance Abuse   Alcohol  Negative    Illegal Drugs  Negative    Rx Drugs  Negative      Personal History of Substance Abuse   Alcohol  Negative    Illegal Drugs  Negative    Rx Drugs  Negative      Age   Age between 56-45 years   No      History of Preadolescent Sexual Abuse   History of Preadolescent Sexual Abuse  Negative or Female      Psychological Disease   Psychological Disease  Positive ADHD   ADHD   Depression  Positive      Total Score   Opioid Risk Tool Scoring  3    Opioid Risk Interpretation  Low Risk      ORT Scoring interpretation table:  Score <3 = Low Risk for SUD  Score between 4-7 = Moderate Risk for  SUD  Score >8 = High Risk for Opioid Abuse   Risk Mitigation Strategies:  Patient Counseling: Completed today. Counseling provided to patient as per "Patient Counseling Document". Document signed by patient, attesting to counseling and understanding Patient-Prescriber Agreement (PPA): Obtained today  Notification to other healthcare providers: Done  Pharmacologic Plan: Patient is tried various opioid analgesics in the past including tramadol, hydrocodone, oxycodone, morphine which she does not find effective.  These medications resulted in GI upset and inability to relieve her pain.  We will trial buprenorphine at 10 mcg to assist with her persistent pain.             Laboratory Chemistry  Inflammation Markers (CRP: Acute Phase) (ESR: Chronic Phase) Lab Results  Component Value Date   CRP 1.7 08/03/2014   ESRSEDRATE 2 08/03/2014                         Rheumatology Markers Lab Results  Component Value Date   RF 12 06/24/2013   ANA Negative 08/03/2014                Renal Function Markers Lab Results  Component Value Date   BUN 13 04/15/2017   CREATININE 0.58 04/15/2017   GFRAA 123 04/15/2017   GFRNONAA 106 04/15/2017                 Hepatic Function Markers Lab Results  Component Value Date   AST 26 03/15/2017   ALT 26 03/15/2017   ALBUMIN 4.3 03/15/2017   ALKPHOS 117 03/15/2017   LIPASE 25 01/12/2016                 Electrolytes Lab Results  Component Value Date   NA 142 04/15/2017   K 4.6 04/15/2017   CL 102 04/15/2017   CALCIUM 9.5 04/15/2017                        Neuropathy Markers Lab Results  Component Value Date   HGBA1C 5.6 07/22/2014                 Bone Pathology Markers Lab Results  Component Value Date   VD25OH 31 01/07/2013  Coagulation Parameters Lab Results  Component Value Date   INR 1.04 02/16/2013   LABPROT 13.6 02/16/2013   APTT 29 02/16/2013   PLT 274 01/12/2016                 Cardiovascular  Markers Lab Results  Component Value Date   CKTOTAL 139 08/03/2014   CKMB 2.8 06/24/2013   HGB 14.6 01/12/2016   HCT 43.4 01/12/2016                 CA Markers No results found for: CEA, CA125, LABCA2               Note: Lab results reviewed.  Recent Diagnostic Imaging Results  MR Lumbar Spine W Wo Contrast CLINICAL DATA:  Back pain with right lower extremity pain. Prior lumbar surgery.  EXAM: MRI LUMBAR SPINE WITHOUT AND WITH CONTRAST  TECHNIQUE: Multiplanar and multiecho pulse sequences of the lumbar spine were obtained without and with intravenous contrast.  CONTRAST:  51m MULTIHANCE GADOBENATE DIMEGLUMINE 529 MG/ML IV SOLN  COMPARISON:  Lumbar spine MRI 02/17/2014  FINDINGS: Segmentation: Normal. The lowest disc space is considered to be L5-S1.  Alignment:  Grade 1 retrolisthesis at L1-2 and L2-3, unchanged.  Vertebrae: No acute compression fracture, facet edema or focal marrow lesion.  Conus medullaris: Extends to the L1 level and appears normal.  Paraspinal and other soft tissues: The visualized aorta, IVC and iliac vessels are normal. The visualized retroperitoneal organs and paraspinal soft tissues are normal.  Disc levels:  T10-T11: Evaluated on sagittal images only. Small disc herniation without spinal canal or neural foraminal stenosis.  T11-T12: Evaluated on sagittal images only. Small disc herniation. No spinal canal or neural foraminal stenosis.  T12-L1: Normal disc space and facet joints. No spinal canal stenosis. No neuroforaminal stenosis.  L1-L2: Narrowing of the intervertebral disc space with disc edema. Mild facet hypertrophy. Small disc osteophyte complex. No spinal canal stenosis. Moderate bilateral, right worse than left, neuroforaminal stenosis. This is unchanged.  L2-L3: Diffuse disc bulge with mild narrowing of the lateral recesses. Mild unchanged spinal canal stenosis. Moderate bilateral unchanged neuroforaminal  stenosis.  L3-L4: Diffuse disc bulge with superimposed central protrusion, unchanged. Bilateral mild facet hypertrophy. Unchanged moderate spinal canal stenosis. Moderate bilateral neural foraminal stenosis. This level is unchanged.  L4-L5: Diffuse disc bulge with narrowing of the left-greater-than-right lateral recesses. No spinal canal stenosis. Mild right and moderate left neuroforaminal stenosis. This level is unchanged.  L5-S1: Left eccentric disc bulge and moderate bilateral facet hypertrophy. No spinal canal stenosis. Moderate left neuroforaminal stenosis, unchanged.  IMPRESSION: 1. Worsened disc edema at L1-L2 with unchanged neuroforaminal stenosis, right greater than left. 2. Unchanged moderate spinal canal stenosis at L3-L4. There is also moderate bilateral neural foraminal narrowing at this level, also unchanged. 3. Unchanged left eccentric disc bulge at L5-S1 narrowing the left neural foramen.  Electronically Signed   By: KUlyses JarredM.D.   On: 12/01/2015 02:19  Complexity Note: Imaging results reviewed. Results shared with Ms. SConley Canal using Layman's terms.                         Meds   Current Outpatient Medications:  .  amphetamine-dextroamphetamine (ADDERALL XR) 30 MG 24 hr capsule, 2 qam, Disp: 60 capsule, Rfl: 0 .  Diclofenac Sodium CR 100 MG 24 hr tablet, TAKE 1 TABLET BY MOUTH TWICE DAILY. TAKE WITH FOOD., Disp: 60 tablet, Rfl: 6 .  DULoxetine (CYMBALTA) 60 MG capsule,  Take 2 capsules (120 mg total) by mouth daily., Disp: 180 capsule, Rfl: 0 .  ibuprofen (ADVIL,MOTRIN) 800 MG tablet, Take 1 tablet (800 mg total) by mouth 2 (two) times daily as needed., Disp: 60 tablet, Rfl: 2 .  omeprazole (PRILOSEC) 40 MG capsule, Take 40 mg by mouth daily., Disp: , Rfl:  .  traZODone (DESYREL) 100 MG tablet, Take 100 mg by mouth at bedtime. 2 tablets at bedtimr, Disp: , Rfl:  .  buprenorphine (BUTRANS - DOSED MCG/HR) 10 MCG/HR PTWK patch, Place 1 patch (10 mcg total)  onto the skin every 7 (seven) days., Disp: 4 patch, Rfl: 0 .  mirabegron ER (MYRBETRIQ) 50 MG TB24 tablet, Take 1 tablet (50 mg total) by mouth daily. (Patient not taking: Reported on 05/20/2017), Disp: 30 tablet, Rfl: 1  ROS  Constitutional: Denies any fever or chills Gastrointestinal: No reported hemesis, hematochezia, vomiting, or acute GI distress Musculoskeletal: Denies any acute onset joint swelling, redness, loss of ROM, or weakness Neurological: No reported episodes of acute onset apraxia, aphasia, dysarthria, agnosia, amnesia, paralysis, loss of coordination, or loss of consciousness  Allergies  Ms. Vanduzer is allergic to wellbutrin [bupropion] and lyrica [pregabalin].  [place]  Drug: Ms. Sacca  reports that she does not use drugs. Alcohol:  reports that she drinks alcohol. Tobacco:  reports that she quit smoking about 16 years ago. Her smoking use included cigarettes. She quit after 10.00 years of use. she has never used smokeless tobacco. Medical:  has a past medical history of ADHD (attention deficit hyperactivity disorder), Arthritis, Back pain, Broken femur ([place]) (08/27/12), Migraine, and Osteoarthritis. Surgical: Ms. Simao  has a past surgical history that includes Total knee arthroplasty; Heel spur surgery; Back surgery; IM nailing femoral shaft fracture (Left, 08/27/2012); Femur IM nail (Left, 08/27/2012); Cervical biopsy w/ loop electrode excision (1990's); Endometrial biopsy (04-05-05 and 02-12-06); Tubal ligation; Shoulder surgery (Left); and Vaginal hysterectomy. Family: family history includes Bipolar disorder in her mother and sister; Breast cancer in her mother; Cancer in her father, mother, and other; Heart failure in her father and other.  Constitutional Exam  General appearance: Well nourished, well developed, and well hydrated. In no apparent acute distress Vitals:   05/20/17 1059  BP: 137/86  Pulse: 75  Resp: 16  Temp: 98.3 F (36.8 C)  TempSrc: Oral  SpO2:  100%  Weight: 248 lb (112.5 kg)  Height: '5\' 6"'$  (1.676 m)   BMI Assessment: Estimated body mass index is 40.03 kg/m as calculated from the following:   Height as of this encounter: '5\' 6"'$  (1.676 m).   Weight as of this encounter: 248 lb (112.5 kg).  BMI interpretation table: BMI level Category Range association with higher incidence of chronic pain  <18 kg/m2 Underweight   18.5-24.9 kg/m2 Ideal body weight   25-29.9 kg/m2 Overweight Increased incidence by 20%  30-34.9 kg/m2 Obese (Class I) Increased incidence by 68%  35-39.9 kg/m2 Severe obesity (Class II) Increased incidence by 136%  >40 kg/m2 Extreme obesity (Class III) Increased incidence by 254%   BMI Readings from Last 4 Encounters:  05/20/17 40.03 kg/m  05/08/17 40.19 kg/m  04/16/17 40.19 kg/m  04/15/17 42.50 kg/m   Wt Readings from Last 4 Encounters:  05/20/17 248 lb (112.5 kg)  05/08/17 249 lb (112.9 kg)  04/16/17 249 lb (112.9 kg)  04/15/17 247 lb 9.6 oz (112.3 kg)  Psych/Mental status: Alert, oriented x 3 (person, place, & time)       Eyes: PERLA Respiratory: No evidence of  acute respiratory distress  Cervical Spine Area Exam  Skin & Axial Inspection: No masses, redness, edema, swelling, or associated skin lesions Alignment: Symmetrical Functional ROM: Unrestricted ROM      Stability: No instability detected Muscle Tone/Strength: Functionally intact. No obvious neuro-muscular anomalies detected. Sensory (Neurological): Unimpaired Palpation: No palpable anomalies              Upper Extremity (UE) Exam    Side: Right upper extremity  Side: Left upper extremity  Skin & Extremity Inspection: Skin color, temperature, and hair growth are WNL. No peripheral edema or cyanosis. No masses, redness, swelling, asymmetry, or associated skin lesions. No contractures.  Skin & Extremity Inspection: Skin color, temperature, and hair growth are WNL. No peripheral edema or cyanosis. No masses, redness, swelling, asymmetry, or  associated skin lesions. No contractures.  Functional ROM: Unrestricted ROM          Functional ROM: Unrestricted ROM          Muscle Tone/Strength: Functionally intact. No obvious neuro-muscular anomalies detected.  Muscle Tone/Strength: Functionally intact. No obvious neuro-muscular anomalies detected.  Sensory (Neurological): Unimpaired          Sensory (Neurological): Unimpaired          Palpation: No palpable anomalies              Palpation: No palpable anomalies              Specialized Test(s): Deferred         Specialized Test(s): Deferred          Thoracic Spine Area Exam  Skin & Axial Inspection: No masses, redness, or swelling Alignment: Symmetrical Functional ROM: Unrestricted ROM Stability: No instability detected Muscle Tone/Strength: Functionally intact. No obvious neuro-muscular anomalies detected. Sensory (Neurological): Unimpaired Muscle strength & Tone: No palpable anomalies   Lumbar Spine Area Exam  Skin & Axial Inspection:No masses, redness, or swelling Alignment:Symmetrical Functional PNT:[id number] ROM, bilaterally Stability:No instability detected Muscle Tone/Strength:Functionally intact. No obvious neuro-muscular anomalies detected. Sensory (Neurological):Articular pain pattern Palpation:No palpable anomalies Provocative Tests: Lumbar Hyperextension and rotation test:Positivebilaterally for facet joint pain. Lumbar Lateral bending test:Positiveipsilateral radicular pain, bilaterally. Positive for bilateral foraminal stenosis. Patrick's Maneuver:Positivefor bilateral S-I arthralgia  Gait & Posture Assessment  Ambulation:limited, cautious, compensatory Gait:Antalgic Posture:Difficulty standing up straight, due to pain  Lower Extremity Exam    Side:Right lower extremity  Side:Left lower extremity  Skin & Extremity Inspection:Evidence of prior arthroplastic surgery  Skin & Extremity Inspection:Evidence of prior  arthroplastic surgery  Functional GQQ:[id number] ROMfor knee joint  Functional IZT:[id number] ROMfor knee joint  Muscle Tone/Strength:Functionally intact. No obvious neuro-muscular anomalies detected.  Muscle Tone/Strength:Functionally intact. No obvious neuro-muscular anomalies detected.  Sensory (Neurological):Arthropathic arthralgia  Sensory (Neurological):Arthropathic arthralgia  Palpation:No palpable anomalies  Palpation:No palpable anomalies    Assessment  Primary Diagnosis & Pertinent Problem List: The primary encounter diagnosis was Lumbar spondylosis. Diagnoses of Lumbar degenerative disc disease, Lumbar facet arthropathy, and Fibromyalgia were also pertinent to this visit.  Status Diagnosis  Persistent Persistent Persistent 1. Lumbar spondylosis   2. Lumbar degenerative disc disease   3. Lumbar facet arthropathy   4. Fibromyalgia      General Recommendations: The pain condition that the patient suffers from is best treated with a multidisciplinary approach that involves an increase in physical activity to prevent de-conditioning and worsening of the pain cycle, as well as psychological counseling (formal and/or informal) to address the co-morbid psychological affects of pain. Treatment will often involve judicious use of pain medications and  interventional procedures to decrease the pain, allowing the patient to participate in the physical activity that will ultimately produce long-lasting pain reductions. The goal of the multidisciplinary approach is to return the patient to a higher level of overall function and to restore their ability to perform activities of daily living.  52 year old female with a history of morbid obesity, chronic back pain, bilateral knee pain secondary to bilateral total knee arthroplasty, back pain secondary to lumbar degenerative disc disease, multilevel lumbar disc herniations, facet arthropathy along with a diagnosis of fibromyalgia.  Patient does have a history of a broken femur in June 2014. She is status post left shoulder surgery status post bilateral total knee arthroplasty. She also has an intramedullary nail in her femoral shaft. Patient has been on chronic opioid therapy in the past with Percocet 10 mg up to3-4  times a day.  Her last prescription of this medication was in November after her surgery.  Patient presents today for follow-up.  Her previous urine drug screen was appropriate.  Opioid agreement form was introduced to the patient and an opioid policy explained to patient.  Given that the patient has tried various oral opioid analgesics including tramadol, Percocet, hydrocodone, morphine which have all been ineffective in relieving her pain and have resulted in GI upset and weight gain as well, we discussed long-acting medication such as buprenorphine.  Patient's usual daily opioid intake is 20-40 mg of oxycodone which equates to a starting dose of buprenorphine 10 mcg patch.  Plan: -Sign opioid agreement -Butrans 10 mcg patch as below. -Continue ibuprofen 800 mg daily as needed.  Please take with food to minimize GI upset. -Continue Cymbalta 120 mg daily as prescribed by psychiatry -Continue physical therapy -Continue psychiatric care  Plan of Care  Pharmacotherapy (Medications Ordered): Meds ordered this encounter  Medications  . buprenorphine (BUTRANS - DOSED MCG/HR) 10 MCG/HR PTWK patch    Sig: Place 1 patch (10 mcg total) onto the skin every 7 (seven) days.    Dispense:  4 patch    Refill:  0    Do not place this medication, or any other prescription from our practice, on "Automatic Refill". Patient may have prescription filled one day early if pharmacy is closed on scheduled refill date.  To last for 30 days from fill date   Future considerations for this patient would include bilateral genicular nerve block, lumbar facet medial branch blocks at L3/4, L4/5 and L5/S1.   Time Note: Greater than  50% of the 25 minute(s) of face-to-face time spent with Ms. Hughley, was spent in counseling/coordination of care regarding: the appropriate use of the pain scale, opioid tolerance, Ms. Lem primary cause of pain, the treatment plan, treatment alternatives, the risks and possible complications of proposed treatment, medication side effects, going over the informed consent, the opioid analgesic risks and possible complications, the appropriate use of her medications, realistic expectations and the medication agreement.  Provider-requested follow-up: Return in about 4 weeks (around 06/17/2017) for Medication Management.  Future Appointments  Date Time Provider [place]  05/21/2017  3:15 PM Debara Pickett, PT [facility] None  06/17/2017  1:45 PM Vevelyn Francois, NP [facility] None  07/08/2017  3:30 PM Norman Clay, MD [facility] None  07/11/2017  3:00 PM Kathyrn Drown, MD [facility]  06/23/2018  2:00 PM Megan Salon, MD [address] None    Primary Care Physician: Kathyrn Drown, MD Location: [facility] Note by: Lauren Greene, M.D Date: 05/20/2017; Time: 11:58  AM  Patient Instructions

## 2017-05-20 NOTE — Progress Notes (Signed)
Safety precautions to be maintained throughout the outpatient stay will include: orient to surroundings, keep bed in low position, maintain call bell within reach at all times, provide assistance with transfer out of bed and ambulation.  

## 2017-05-21 ENCOUNTER — Ambulatory Visit (HOSPITAL_COMMUNITY): Payer: PPO

## 2017-05-21 ENCOUNTER — Telehealth (HOSPITAL_COMMUNITY): Payer: Self-pay

## 2017-05-21 NOTE — Telephone Encounter (Signed)
Pt left message she had to do drug test today and could not be here

## 2017-05-23 ENCOUNTER — Telehealth: Payer: Self-pay | Admitting: *Deleted

## 2017-05-23 ENCOUNTER — Other Ambulatory Visit: Payer: Self-pay | Admitting: *Deleted

## 2017-05-23 DIAGNOSIS — Z1211 Encounter for screening for malignant neoplasm of colon: Secondary | ICD-10-CM

## 2017-05-23 LAB — COLOGUARD: Cologuard: NEGATIVE

## 2017-05-23 NOTE — Telephone Encounter (Signed)
Detailed message left on cell number per DPR with negative cologuard results. Advised patient to return call if any additional questions.   Will close encounter.

## 2017-06-17 ENCOUNTER — Other Ambulatory Visit: Payer: Self-pay

## 2017-06-17 ENCOUNTER — Ambulatory Visit: Payer: PPO | Attending: Nurse Practitioner | Admitting: Student in an Organized Health Care Education/Training Program

## 2017-06-17 ENCOUNTER — Encounter: Payer: Self-pay | Admitting: Student in an Organized Health Care Education/Training Program

## 2017-06-17 ENCOUNTER — Encounter: Payer: Self-pay | Admitting: Nurse Practitioner

## 2017-06-17 VITALS — BP 145/98 | HR 69 | Temp 98.1°F | Resp 18 | Ht 68.0 in | Wt 242.0 lb

## 2017-06-17 DIAGNOSIS — M797 Fibromyalgia: Secondary | ICD-10-CM | POA: Insufficient documentation

## 2017-06-17 DIAGNOSIS — Z8249 Family history of ischemic heart disease and other diseases of the circulatory system: Secondary | ICD-10-CM | POA: Insufficient documentation

## 2017-06-17 DIAGNOSIS — M5136 Other intervertebral disc degeneration, lumbar region: Secondary | ICD-10-CM | POA: Insufficient documentation

## 2017-06-17 DIAGNOSIS — Z6836 Body mass index (BMI) 36.0-36.9, adult: Secondary | ICD-10-CM | POA: Diagnosis not present

## 2017-06-17 DIAGNOSIS — Z96653 Presence of artificial knee joint, bilateral: Secondary | ICD-10-CM | POA: Insufficient documentation

## 2017-06-17 DIAGNOSIS — Z818 Family history of other mental and behavioral disorders: Secondary | ICD-10-CM | POA: Insufficient documentation

## 2017-06-17 DIAGNOSIS — Z87891 Personal history of nicotine dependence: Secondary | ICD-10-CM | POA: Insufficient documentation

## 2017-06-17 DIAGNOSIS — M47816 Spondylosis without myelopathy or radiculopathy, lumbar region: Secondary | ICD-10-CM | POA: Diagnosis not present

## 2017-06-17 DIAGNOSIS — G894 Chronic pain syndrome: Secondary | ICD-10-CM | POA: Diagnosis not present

## 2017-06-17 DIAGNOSIS — F329 Major depressive disorder, single episode, unspecified: Secondary | ICD-10-CM | POA: Insufficient documentation

## 2017-06-17 DIAGNOSIS — Z79899 Other long term (current) drug therapy: Secondary | ICD-10-CM | POA: Diagnosis not present

## 2017-06-17 DIAGNOSIS — K219 Gastro-esophageal reflux disease without esophagitis: Secondary | ICD-10-CM | POA: Insufficient documentation

## 2017-06-17 DIAGNOSIS — Z803 Family history of malignant neoplasm of breast: Secondary | ICD-10-CM | POA: Insufficient documentation

## 2017-06-17 DIAGNOSIS — Z79891 Long term (current) use of opiate analgesic: Secondary | ICD-10-CM | POA: Diagnosis not present

## 2017-06-17 DIAGNOSIS — M1288 Other specific arthropathies, not elsewhere classified, other specified site: Secondary | ICD-10-CM | POA: Diagnosis not present

## 2017-06-17 DIAGNOSIS — F909 Attention-deficit hyperactivity disorder, unspecified type: Secondary | ICD-10-CM | POA: Insufficient documentation

## 2017-06-17 DIAGNOSIS — M545 Low back pain: Secondary | ICD-10-CM | POA: Diagnosis present

## 2017-06-17 DIAGNOSIS — R030 Elevated blood-pressure reading, without diagnosis of hypertension: Secondary | ICD-10-CM | POA: Diagnosis not present

## 2017-06-17 DIAGNOSIS — Z90710 Acquired absence of both cervix and uterus: Secondary | ICD-10-CM | POA: Insufficient documentation

## 2017-06-17 MED ORDER — OXYCODONE-ACETAMINOPHEN 10-325 MG PO TABS: 1.0000 | ORAL_TABLET | Freq: Three times a day (TID) | ORAL | 0 refills | Status: DC | PRN

## 2017-06-17 NOTE — Progress Notes (Signed)
Patient brought back box of buprenorphine patches.  4 used patches in box.  States she took last patch off after 2 days due to swelling of extremities, intestinal changes.

## 2017-06-17 NOTE — Progress Notes (Signed)
Patient's Name: Lauren Greene  MRN: [id number]  Referring Provider: Kathyrn Drown, MD  DOB: 1965-05-25  PCP: Kathyrn Drown, MD  DOS: 06/17/2017  Note by: Gillis Santa, MD  Service setting: Ambulatory outpatient  Specialty: Interventional Pain Management  Location: [facility] (AMB) Pain Management Facility    Patient type: Established   Primary Reason(s) for Visit: Encounter for prescription drug management. (Level of risk: moderate)  CC: Back Pain (low)  HPI  Lauren Greene is a 52 y.o. year old, female patient, who comes today for a medication management evaluation. She has ADD (attention deficit disorder); Periprosthetic fracture around internal prosthetic left knee joint; Depression, major, in remission ([place]); Chronic arthralgias of knees and hips; Chronic back pain; Arthritis; Myalgia and myositis; Elevated blood pressure; Morbid obesity ([place]); Osteoarthritis; Hyperhydrosis disorder; Moderate episode of recurrent major depressive disorder ([place]); Bunion, right; Lumbar degenerative disc disease; GERD (gastroesophageal reflux disease); Chronic, continuous use of opioids; and Fibromyalgia on their problem list. Her primarily concern today is the Back Pain (low)  Pain Assessment: Location: Lower Back Radiating: denies Onset: More than a month ago Duration: Chronic pain Quality: Aching, Constant Severity: 8 /10 (self-reported pain score)  Note: Reported level is compatible with observation.                         When using our objective Pain Scale, levels between 6 and 10/10 are said to belong in an emergency room, as it progressively worsens from a 6/10, described as severely limiting, requiring emergency care not usually available at an outpatient pain management facility. At a 6/10 level, communication becomes difficult and requires great effort. Assistance to reach the emergency department may be required. Facial flushing and profuse sweating along with potentially dangerous increases in heart  rate and blood pressure will be evident. Effect on ADL:   Timing: Constant Modifying factors: ice packs, percocet  Lauren Greene was last scheduled for an appointment on 05/20/2017 for medication management. During today's appointment we reviewed [name]'s chronic pain status, as well as her outpatient medication regimen.  Patient returns today for follow-up in regards to her medications.  She states that the [facility] patch was not very effective.  It resulted in constipation and also lower extremity swelling.  She tried the patch for 3 weeks.  She states that the only medication that is effective is Percocet.  The patient  reports that she does not use drugs. Her body mass index is 36.8 kg/m.  Further details on both, my assessment(s), as well as the proposed treatment plan, please see below.  Controlled Substance Pharmacotherapy Assessment REMS (Risk Evaluation and Mitigation Strategy)  Analgesic: Percocet 10 mg up to 3 times a day as needed for severe pain; max #75 a month MME/day: 40-45 mg/day.  Dewayne Shorter, RN  06/17/2017  2:13 PM  Signed Patient brought back box of buprenorphine patches.  4 used patches in box.  States she took last patch off after 2 days due to swelling of extremities, intestinal changes.   Pharmacokinetics: Liberation and absorption (onset of action): WNL Distribution (time to peak effect): WNL Metabolism and excretion (duration of action): WNL         Pharmacodynamics: Desired effects: Analgesia: Lauren Greene reports >50% benefit. Functional ability: Patient reports that medication allows her to accomplish basic ADLs Clinically meaningful improvement in function (CMIF): Sustained CMIF goals met Perceived effectiveness: Described as relatively effective, allowing for increase in activities of daily living (ADL) Undesirable  effects: Side-effects or Adverse reactions: None reported Monitoring: [place] PMP: Online review of the past 53-monthperiod conducted.  Compliant with practice rules and regulations Last UDS on record: Summary  Date Value Ref Range Status  05/08/2017 FINAL  Final    Comment:    ==================================================================== TOXASSURE COMP DRUG ANALYSIS,UR ==================================================================== Test                             Result       Flag       Units Drug Present and Declared for Prescription Verification   Amphetamine                    >7143        EXPECTED   ng/mg creat    Amphetamine is available as a schedule II prescription drug.   Duloxetine                     PRESENT      EXPECTED   Trazodone                      PRESENT      EXPECTED   1,3 chlorophenyl piperazine    PRESENT      EXPECTED    1,3-chlorophenyl piperazine is an expected metabolite of    trazodone.   Diclofenac                     PRESENT      EXPECTED Drug Absent but Declared for Prescription Verification   Ibuprofen                      Not Detected UNEXPECTED    Ibuprofen, as indicated in the declared medication list, is not    always detected even when used as directed. ==================================================================== Test                      Result    Flag   Units      Ref Range   Creatinine              140              mg/dL      >=20 ==================================================================== Declared Medications:  The flagging and interpretation on this report are based on the  following declared medications.  Unexpected results may arise from  inaccuracies in the declared medications.  **Note: The testing scope of this panel includes these medications:  Amphetamine (Adderall XR)  Duloxetine (Cymbalta)  Trazodone (Desyrel)  **Note: The testing scope of this panel does not include small to  moderate amounts of these reported medications:  Diclofenac  Ibuprofen  **Note: The testing scope of this panel does not include following  reported  medications:  Mirabegron (Myrbetriq)  Omeprazole (Prilosec) ==================================================================== For clinical consultation, please call ([phone] ====================================================================    UDS interpretation: Compliant          Medication Assessment Form: Reviewed. Patient indicates being compliant with therapy Treatment compliance: Compliant Risk Assessment Profile: Aberrant behavior: See prior evaluations. None observed or detected today Comorbid factors increasing risk of overdose: See prior notes. No additional risks detected today Risk of substance use disorder (SUD): Low Opioid Risk Tool - 05/20/17 1111      Family History of Substance Abuse   Alcohol  Negative    Illegal Drugs  Negative    Rx Drugs  Negative      Personal History of Substance Abuse   Alcohol  Negative    Illegal Drugs  Negative    Rx Drugs  Negative      Age   Age between 43-45 years   No      History of Preadolescent Sexual Abuse   History of Preadolescent Sexual Abuse  Negative or Female      Psychological Disease   Psychological Disease  Positive ADHD   ADHD   Depression  Positive      Total Score   Opioid Risk Tool Scoring  3    Opioid Risk Interpretation  Low Risk      ORT Scoring interpretation table:  Score <3 = Low Risk for SUD  Score between 4-7 = Moderate Risk for SUD  Score >8 = High Risk for Opioid Abuse   Risk Mitigation Strategies:  Patient Counseling: Covered Patient-Prescriber Agreement (PPA): Present and active  Notification to other healthcare providers: Done  Pharmacologic Plan: Discontinue Butrans.  1 patch was discarded.  Prescription for Percocet 10 mg twice daily to 3 times daily as needed for moderate to severe pain; max 75 a month             Laboratory Chemistry  Inflammation Markers (CRP: Acute Phase) (ESR: Chronic Phase) Lab Results  Component Value Date   CRP 1.7 08/03/2014   ESRSEDRATE 2  08/03/2014                         Rheumatology Markers Lab Results  Component Value Date   RF 12 06/24/2013   ANA Negative 08/03/2014                        Renal Function Markers Lab Results  Component Value Date   BUN 13 04/15/2017   CREATININE 0.58 04/15/2017   GFRAA 123 04/15/2017   GFRNONAA 106 04/15/2017                              Hepatic Function Markers Lab Results  Component Value Date   AST 26 03/15/2017   ALT 26 03/15/2017   ALBUMIN 4.3 03/15/2017   ALKPHOS 117 03/15/2017   LIPASE 25 01/12/2016                        Electrolytes Lab Results  Component Value Date   NA 142 04/15/2017   K 4.6 04/15/2017   CL 102 04/15/2017   CALCIUM 9.5 04/15/2017                        Neuropathy Markers Lab Results  Component Value Date   HGBA1C 5.6 07/22/2014                        Bone Pathology Markers Lab Results  Component Value Date   VD25OH 31 01/07/2013                         Coagulation Parameters Lab Results  Component Value Date   INR 1.04 02/16/2013   LABPROT 13.6 02/16/2013   APTT 29 02/16/2013   PLT 274 01/12/2016  Cardiovascular Markers Lab Results  Component Value Date   CKTOTAL 139 08/03/2014   CKMB 2.8 06/24/2013   HGB 14.6 01/12/2016   HCT 43.4 01/12/2016                         CA Markers No results found for: CEA, CA125, LABCA2                      Note: Lab results reviewed.  Recent Diagnostic Imaging Results  MR Lumbar Spine W Wo Contrast CLINICAL DATA:  Back pain with right lower extremity pain. Prior lumbar surgery.  EXAM: MRI LUMBAR SPINE WITHOUT AND WITH CONTRAST  TECHNIQUE: Multiplanar and multiecho pulse sequences of the lumbar spine were obtained without and with intravenous contrast.  CONTRAST:  39m MULTIHANCE GADOBENATE DIMEGLUMINE 529 MG/ML IV SOLN  COMPARISON:  Lumbar spine MRI 02/17/2014  FINDINGS: Segmentation: Normal. The lowest disc space is considered to  be L5-S1.  Alignment:  Grade 1 retrolisthesis at L1-2 and L2-3, unchanged.  Vertebrae: No acute compression fracture, facet edema or focal marrow lesion.  Conus medullaris: Extends to the L1 level and appears normal.  Paraspinal and other soft tissues: The visualized aorta, IVC and iliac vessels are normal. The visualized retroperitoneal organs and paraspinal soft tissues are normal.  Disc levels:  T10-T11: Evaluated on sagittal images only. Small disc herniation without spinal canal or neural foraminal stenosis.  T11-T12: Evaluated on sagittal images only. Small disc herniation. No spinal canal or neural foraminal stenosis.  T12-L1: Normal disc space and facet joints. No spinal canal stenosis. No neuroforaminal stenosis.  L1-L2: Narrowing of the intervertebral disc space with disc edema. Mild facet hypertrophy. Small disc osteophyte complex. No spinal canal stenosis. Moderate bilateral, right worse than left, neuroforaminal stenosis. This is unchanged.  L2-L3: Diffuse disc bulge with mild narrowing of the lateral recesses. Mild unchanged spinal canal stenosis. Moderate bilateral unchanged neuroforaminal stenosis.  L3-L4: Diffuse disc bulge with superimposed central protrusion, unchanged. Bilateral mild facet hypertrophy. Unchanged moderate spinal canal stenosis. Moderate bilateral neural foraminal stenosis. This level is unchanged.  L4-L5: Diffuse disc bulge with narrowing of the left-greater-than-right lateral recesses. No spinal canal stenosis. Mild right and moderate left neuroforaminal stenosis. This level is unchanged.  L5-S1: Left eccentric disc bulge and moderate bilateral facet hypertrophy. No spinal canal stenosis. Moderate left neuroforaminal stenosis, unchanged.  IMPRESSION: 1. Worsened disc edema at L1-L2 with unchanged neuroforaminal stenosis, right greater than left. 2. Unchanged moderate spinal canal stenosis at L3-L4. There is also moderate  bilateral neural foraminal narrowing at this level, also unchanged. 3. Unchanged left eccentric disc bulge at L5-S1 narrowing the left neural foramen.  Electronically Signed   By: KUlyses JarredM.D.   On: 12/01/2015 02:19  Complexity Note: Imaging results reviewed. Results shared with Ms. SConley Canal using Layman's terms.                         Meds   Current Outpatient Medications:  .  amphetamine-dextroamphetamine (ADDERALL XR) 30 MG 24 hr capsule, 2 qam, Disp: 60 capsule, Rfl: 0 .  Diclofenac Sodium CR 100 MG 24 hr tablet, TAKE 1 TABLET BY MOUTH TWICE DAILY. TAKE WITH FOOD., Disp: 60 tablet, Rfl: 6 .  DULoxetine (CYMBALTA) 60 MG capsule, Take 2 capsules (120 mg total) by mouth daily., Disp: 180 capsule, Rfl: 0 .  omeprazole (PRILOSEC) 40 MG capsule, Take 40 mg by mouth daily., Disp: ,  Rfl:  .  traZODone (DESYREL) 100 MG tablet, Take 100 mg by mouth at bedtime. 2 tablets at bedtimr, Disp: , Rfl:  .  clobetasol ointment (TEMOVATE) 0.05 %, , Disp: , Rfl:  .  oxyCODONE-acetaminophen (PERCOCET) 10-325 MG tablet, Take 1 tablet by mouth 3 (three) times daily as needed for pain., Disp: 75 tablet, Rfl: 0  ROS  Constitutional: Denies any fever or chills Gastrointestinal: No reported hemesis, hematochezia, vomiting, or acute GI distress Musculoskeletal: Denies any acute onset joint swelling, redness, loss of ROM, or weakness Neurological: No reported episodes of acute onset apraxia, aphasia, dysarthria, agnosia, amnesia, paralysis, loss of coordination, or loss of consciousness  Allergies  Ms. Klumb is allergic to wellbutrin [bupropion] and lyrica [pregabalin].  [place]  Drug: Ms. Scharfenberg  reports that she does not use drugs. Alcohol:  reports that she drinks alcohol. Tobacco:  reports that she quit smoking about 16 years ago. Her smoking use included cigarettes. She quit after 10.00 years of use. She has never used smokeless tobacco. Medical:  has a past medical history of ADHD  (attention deficit hyperactivity disorder), Arthritis, Back pain, Broken femur ([place]) (08/27/12), Migraine, and Osteoarthritis. Surgical: Ms. Haubner  has a past surgical history that includes Total knee arthroplasty; Heel spur surgery; Back surgery; IM nailing femoral shaft fracture (Left, 08/27/2012); Femur IM nail (Left, 08/27/2012); Cervical biopsy w/ loop electrode excision (1990's); Endometrial biopsy (04-05-05 and 02-12-06); Tubal ligation; Shoulder surgery (Left); and Vaginal hysterectomy. Family: family history includes Bipolar disorder in her mother and sister; Breast cancer in her mother; Cancer in her father, mother, and other; Heart failure in her father and other.  Constitutional Exam  General appearance: Well nourished, well developed, and well hydrated. In no apparent acute distress Vitals:   06/17/17 1405 06/17/17 1407  BP:  (!) 145/98  Pulse: 69   Resp: 18   Temp: 98.1 F (36.7 C)   SpO2: 100%   Weight: 242 lb (109.8 kg)   Height: '5\' 8"'$  (1.727 m)    BMI Assessment: Estimated body mass index is 36.8 kg/m as calculated from the following:   Height as of this encounter: '5\' 8"'$  (1.727 m).   Weight as of this encounter: 242 lb (109.8 kg).  BMI interpretation table: BMI level Category Range association with higher incidence of chronic pain  <18 kg/m2 Underweight   18.5-24.9 kg/m2 Ideal body weight   25-29.9 kg/m2 Overweight Increased incidence by 20%  30-34.9 kg/m2 Obese (Class I) Increased incidence by 68%  35-39.9 kg/m2 Severe obesity (Class II) Increased incidence by 136%  >40 kg/m2 Extreme obesity (Class III) Increased incidence by 254%   BMI Readings from Last 4 Encounters:  06/17/17 36.80 kg/m  05/20/17 40.03 kg/m  05/08/17 40.19 kg/m  04/16/17 40.19 kg/m   Wt Readings from Last 4 Encounters:  06/17/17 242 lb (109.8 kg)  05/20/17 248 lb (112.5 kg)  05/08/17 249 lb (112.9 kg)  04/16/17 249 lb (112.9 kg)  Psych/Mental status: Alert, oriented x 3 (person,  place, & time)       Eyes: PERLA Respiratory: No evidence of acute respiratory distress  Cervical Spine Area Exam  Skin & Axial Inspection: No masses, redness, edema, swelling, or associated skin lesions Alignment: Symmetrical Functional ROM: Unrestricted ROM      Stability: No instability detected Muscle Tone/Strength: Functionally intact. No obvious neuro-muscular anomalies detected. Sensory (Neurological): Unimpaired Palpation: No palpable anomalies              Upper Extremity (UE) Exam  Side: Right upper extremity  Side: Left upper extremity  Skin & Extremity Inspection: Skin color, temperature, and hair growth are WNL. No peripheral edema or cyanosis. No masses, redness, swelling, asymmetry, or associated skin lesions. No contractures.  Skin & Extremity Inspection: Skin color, temperature, and hair growth are WNL. No peripheral edema or cyanosis. No masses, redness, swelling, asymmetry, or associated skin lesions. No contractures.  Functional ROM: Unrestricted ROM          Functional ROM: Unrestricted ROM          Muscle Tone/Strength: Functionally intact. No obvious neuro-muscular anomalies detected.  Muscle Tone/Strength: Functionally intact. No obvious neuro-muscular anomalies detected.  Sensory (Neurological): Unimpaired          Sensory (Neurological): Unimpaired          Palpation: No palpable anomalies              Palpation: No palpable anomalies              Specialized Test(s): Deferred         Specialized Test(s): Deferred          Thoracic Spine Area Exam  Skin & Axial Inspection: No masses, redness, or swelling Alignment: Symmetrical Functional ROM: Unrestricted ROM Stability: No instability detected Muscle Tone/Strength: Functionally intact. No obvious neuro-muscular anomalies detected. Sensory (Neurological): Unimpaired Muscle strength & Tone: No palpable anomalies  Lumbar Spine Area Exam  Skin & Axial Inspection: No masses, redness, or swelling Alignment:  Symmetrical Functional ROM: Decreased ROM, bilaterally Stability: No instability detected Muscle Tone/Strength: Functionally intact. No obvious neuro-muscular anomalies detected. Sensory (Neurological): Articular pain pattern Palpation: No palpable anomalies       Provocative Tests: Lumbar Hyperextension and rotation test: Positive bilaterally for facet joint pain. Lumbar Lateral bending test: Positive ipsilateral radicular pain, bilaterally. Positive for bilateral foraminal stenosis. Patrick's Maneuver: Positive for bilateral S-I arthralgia              Gait & Posture Assessment  Ambulation: Limited Gait: Antalgic Posture: Difficulty standing up straight, due to pain   Lower Extremity Exam    Side: Right lower extremity  Side: Left lower extremity  Skin & Extremity Inspection: Skin color, temperature, and hair growth are WNL. No peripheral edema or cyanosis. No masses, redness, swelling, asymmetry, or associated skin lesions. No contractures.  Skin & Extremity Inspection: Skin color, temperature, and hair growth are WNL. No peripheral edema or cyanosis. No masses, redness, swelling, asymmetry, or associated skin lesions. No contractures.  Functional ROM: Unrestricted ROM          Functional ROM: Unrestricted ROM          Muscle Tone/Strength: Functionally intact. No obvious neuro-muscular anomalies detected.  Muscle Tone/Strength: Functionally intact. No obvious neuro-muscular anomalies detected.  Sensory (Neurological): Unimpaired  Sensory (Neurological): Unimpaired  Palpation: No palpable anomalies  Palpation: No palpable anomalies   Assessment  Primary Diagnosis & Pertinent Problem List: The primary encounter diagnosis was Lumbar spondylosis. Diagnoses of Lumbar degenerative disc disease, Lumbar facet arthropathy, Fibromyalgia, and Chronic pain syndrome were also pertinent to this visit.  Status Diagnosis  Persistent Persistent Persistent 1. Lumbar spondylosis   2. Lumbar  degenerative disc disease   3. Lumbar facet arthropathy   4. Fibromyalgia   5. Chronic pain syndrome      General Recommendations: The pain condition that the patient suffers from is best treated with a multidisciplinary approach that involves an increase in physical activity to prevent de-conditioning and worsening of the pain  cycle, as well as psychological counseling (formal and/or informal) to address the co-morbid psychological affects of pain. Treatment will often involve judicious use of pain medications and interventional procedures to decrease the pain, allowing the patient to participate in the physical activity that will ultimately produce long-lasting pain reductions. The goal of the multidisciplinary approach is to return the patient to a higher level of overall function and to restore their ability to perform activities of daily living.  53 year old female with a history of morbid obesity, chronic back pain, bilateral knee pain secondary to bilateral total knee arthroplasty, back pain secondary to lumbar degenerative disc disease, multilevel lumbar disc herniations, facet arthropathy along with a diagnosis of fibromyalgia. Patient does have a history of a broken femur in June 2014. She is status post left shoulder surgery status post bilateral total knee arthroplasty. She also has an intramedullary nail in her femoral shaft.   Patient returns today for follow-up in regards to her medications.  She states that the Butrans patch at 56mg/hr was not very effective.  It resulted in constipation and also lower extremity swelling.  She tried the patch for 3 weeks.  She states that the only medication that is somewhat effective is Percocet. The patient has tried various oral opioid analgesics including tramadol, hydrocodone, morphine which have all been ineffective in relieving her pain and have resulted in GI upset.  We will also repeat urine drug screen today.  Plan: -Discontinue Butrans  patch -Continue ibuprofen 800 mg daily as needed -Continue Cymbalta 100 mg daily prescribed by psychiatry -Continue physical therapy -Prescription for Percocet 10 mg twice daily to 3 times daily as needed, max #75 a month. -Repeat urine drug screen today.  Plan of Care  Pharmacotherapy (Medications Ordered): Meds ordered this encounter  Medications  . oxyCODONE-acetaminophen (PERCOCET) 10-325 MG tablet    Sig: Take 1 tablet by mouth 3 (three) times daily as needed for pain.    Dispense:  75 tablet    Refill:  0    For chronic pain To last for 30 days from fill date Max #: 75/month  Do not place this medication, or any other prescription from our practice, on "Automatic Refill". Patient may have prescription filled one day early if pharmacy is closed on scheduled refill date.   Lab-work, procedure(s), and/or referral(s): Orders Placed This Encounter  Procedures  . ToxASSURE Select 13 (MW), Urine   Provider-requested follow-up: Return in about 1 month (around 07/15/2017) for Medication Management. Time Note: Greater than 50% of the 25 minute(s) of face-to-face time spent with Ms. SJeanbaptiste was spent in counseling/coordination of care regarding: Ms. SDonlonprimary cause of pain, the treatment plan, medication side effects, the opioid analgesic risks and possible complications, the appropriate use of her medications, realistic expectations, the need to bring and keep the BMI below 30, the medication agreement and the patient's responsibilities when it comes to controlled substances. Future Appointments  Date Time Provider [place] 07/08/2017  3:30 PM HNorman Clay MD [facility] None  07/11/2017  3:00 PM LKathyrn Drown MD [facility] 06/23/2018  2:00 PM MMegan Salon MD [place]None    Primary Care Physician: LKathyrn Drown MD [facility] Note by: BGillis Santa M.D Date: 06/17/2017; Time: 2:30 PM  There are no Patient Instructions  on file for this visit.

## 2017-06-17 NOTE — Patient Instructions (Signed)
You have been given a script for oxycodone x 1 to last until 07-17-17.

## 2017-06-21 LAB — TOXASSURE SELECT 13 (MW), URINE

## 2017-07-07 NOTE — Progress Notes (Deleted)
BH MD/PA/NP OP Progress Note  07/07/2017 12:28 PM Lauren Greene  MRN:  [id number]  Chief Complaint:  HPI: *** Visit Diagnosis: No diagnosis found.  Past Psychiatric History:  I have reviewed the patient's psychiatry history in detail and updated the patient record. Outpatient: She was evaluated for ADD in [facility] in 2014; started on Adderall since then. Psychiatry admission: denies Previous suicide attempt: denies Past trials of medication: Celexa, Wellbutrin ("mean"), Prozac, Effexor, Mirtazapine, duloxetine, nortriptyline (dry mouth), Adderall (4 years), gabapentin, Lyrica (swelling), lorazepam, valium   Past Medical History:  Past Medical History:  Diagnosis Date  . ADHD (attention deficit hyperactivity disorder)   . Arthritis   . Back pain   . Broken femur (HCC) 08/27/12  . Migraine   . Osteoarthritis     Past Surgical History:  Procedure Laterality Date  . BACK SURGERY    . CERVICAL BIOPSY  W/ LOOP ELECTRODE EXCISION  1990's   in [place], [place]  . ENDOMETRIAL BIOPSY  04-05-05 and 02-12-06   04-05-05 benign but too rapid growth. RXd Provera and repeat Bx 75mo. 02-12-06 Bx benign--Dr. Leda Quail  . FEMUR IM NAIL Left 08/27/2012   Procedure: INTRAMEDULLARY (IM) RETROGRADE FEMORAL NAILING;  Surgeon: Kathryne Hitch, MD;  Location: [facility] OR;  Service: Orthopedics;  Laterality: Left;  . HEEL SPUR SURGERY    . IM NAILING FEMORAL SHAFT FRACTURE Left 08/27/2012   Dr Magnus Ivan  . SHOULDER SURGERY Left   . TOTAL KNEE ARTHROPLASTY     x2  . TUBAL LIGATION    . VAGINAL HYSTERECTOMY      Family Psychiatric History:  I have reviewed the patient's family history in detail and updated the patient record. Family History:  Family History  Problem Relation Age of Onset  . Cancer Mother   . Breast cancer Mother   . Bipolar disorder Mother   . Heart failure Father   . Cancer Father   . Cancer Other   . Heart failure Other   . Bipolar disorder Sister     Social  History:  Social History   Socioeconomic History  . Marital status: Single    Spouse name: Not on file  . Number of children: Not on file  . Years of education: Not on file  . Highest education level: Not on file  Occupational History  . Not on file  Social Needs  . Financial resource strain: Not on file  . Food insecurity:    Worry: Not on file    Inability: Not on file  . Transportation needs:    Medical: Not on file    Non-medical: Not on file  Tobacco Use  . Smoking status: Former Smoker    Years: 10.00    Types: Cigarettes    Last attempt to quit: 08/16/2000    Years since quitting: 16.9  . Smokeless tobacco: Never Used  Substance and Sexual Activity  . Alcohol use: Yes    Alcohol/week: 0.0 oz    Comment: rarely  . Drug use: No  . Sexual activity: Not Currently    Partners: Female  Lifestyle  . Physical activity:    Days per week: Not on file    Minutes per session: Not on file  . Stress: Not on file  Relationships  . Social connections:    Talks on phone: Not on file    Gets together: Not on file    Attends religious service: Not on file    Active member of club  or organization: Not on file    Attends meetings of clubs or organizations: Not on file    Relationship status: Not on file  Other Topics Concern  . Not on file  Social History Narrative  . Not on file    Allergies:  Allergies  Allergen Reactions  . Wellbutrin [Bupropion]     Anger issues  . Lyrica [Pregabalin] Swelling    Leg swelling    Metabolic Disorder Labs: Lab Results  Component Value Date   HGBA1C 5.6 07/22/2014   MPG 103 02/16/2013   MPG 111 07/24/2010   No results found for: PROLACTIN Lab Results  Component Value Date   CHOL 195 03/15/2017   TRIG 119 03/15/2017   HDL 55 03/15/2017   CHOLHDL 3.5 03/15/2017   VLDL 13 07/10/2012   LDLCALC 116 (H) 03/15/2017   LDLCALC 108 (H) 01/12/2016   Lab Results  Component Value Date   TSH 4.104 06/24/2013   TSH 4.142 01/07/2013     Therapeutic Level Labs: No results found for: LITHIUM No results found for: VALPROATE No components found for:  CBMZ  Current Medications: Current Outpatient Medications  Medication Sig Dispense Refill  . amphetamine-dextroamphetamine (ADDERALL XR) 30 MG 24 hr capsule 2 qam 60 capsule 0  . clobetasol ointment (TEMOVATE) 0.05 %     . Diclofenac Sodium CR 100 MG 24 hr tablet TAKE 1 TABLET BY MOUTH TWICE DAILY. TAKE WITH FOOD. 60 tablet 6  . DULoxetine (CYMBALTA) 60 MG capsule Take 2 capsules (120 mg total) by mouth daily. 180 capsule 0  . omeprazole (PRILOSEC) 40 MG capsule Take 40 mg by mouth daily.    Marland Kitchen. oxyCODONE-acetaminophen (PERCOCET) 10-325 MG tablet Take 1 tablet by mouth 3 (three) times daily as needed for pain. 75 tablet 0  . traZODone (DESYREL) 100 MG tablet Take 100 mg by mouth at bedtime. 2 tablets at bedtimr     No current facility-administered medications for this visit.      Musculoskeletal: Strength & Muscle Tone: within normal limits Gait & Station: normal Patient leans: N/A  Psychiatric Specialty Exam: ROS  Last menstrual period 03/18/2004.There is no height or weight on file to calculate BMI.  General Appearance: Fairly Groomed  Eye Contact:  Good  Speech:  Clear and Coherent  Volume:  Normal  Mood:  {BHH MOOD:22306}  Affect:  {Affect (PAA):22687}  Thought Process:  Coherent and Goal Directed  Orientation:  Full (Time, Place, and Person)  Thought Content: Logical   Suicidal Thoughts:  {ST/HT (PAA):22692}  Homicidal Thoughts:  {ST/HT (PAA):22692}  Memory:  Immediate;   Good  Judgement:  {Judgement (PAA):22694}  Insight:  {Insight (PAA):22695}  Psychomotor Activity:  Normal  Concentration:  Concentration: Good and Attention Span: Good  Recall:  Good  Fund of Knowledge: Good  Language: Good  Akathisia:  No  Handed:  Right  AIMS (if indicated): not done  Assets:  Communication Skills Desire for Improvement  ADL's:  Intact  Cognition: WNL   Sleep:  {BHH GOOD/FAIR/POOR:22877}   Screenings: PHQ2-9     [facility] Office Visit from 05/08/2017 [facility] Visit from [facility]2017 [facility] Visit from [facility]/2017 [facility]  PHQ-2 Total Score  0  0  0  0  0  Assessment and Plan:  Lauren Greene is a 52 y.o. year old female with a history of depression, ADHD diagnosed by PCP, hyperhydrosis, chronic pain, who presents for follow up appointment for No diagnosis found.  # MDD, moderate, recurrent without psychotic features  Patient reports occasional episodes of depression in the setting of being unable to have pain provider and discordance with her son.  Will continue duloxetine to target neurovegetative symptoms and pain while monitoring hypertension.  Will consider adjunctive treatment in the future if any worsening in her neurovegetative symptoms.  Will continue trazodone as needed for insomnia.  Will continue Valium as needed for anxiety.  Discussed healthy boundary.  Validated her demoralization from pain.  Discussed behavioral activation.   # ADHD She was diagnosed with ADHD per report and has been on Adderall by her PCP.  Will continue to monitor any side effect.   Plan 1 Continue duloxetine 120 mg daily  2. Continue Trazodone 200 mg at night as needed for sleep 3. Continue valium 2 mg daily as needed for anxiety (she declines refill at this time) 4.Return to clinic in three monthsfor 30 mins (Patient is on Adderall XR 30 mg daily prescribed by her PCP)  The patient demonstrates the following risk factors for suicide: Chronic risk factors for suicide include: psychiatric disorder of depressionand chronic pain. Acute risk  factorsfor suicide include: loss (financial, interpersonal, professional). Protective factorsfor this patient include: responsibility to others (children, family), coping skills and hope for the future. Considering these factors, the overall suicide risk at this point appears to be low. Patient isappropriate for outpatient follow up.    Neysa Hotter, MD 07/07/2017, 12:28 PM

## 2017-07-08 ENCOUNTER — Ambulatory Visit (HOSPITAL_COMMUNITY): Payer: Self-pay | Admitting: Psychiatry

## 2017-07-08 NOTE — Progress Notes (Deleted)
BH MD/PA/NP OP Progress Note  07/08/2017 4:25 PM Lauren Greene  MRN:  [id number]  Chief Complaint:  HPI: *** Visit Diagnosis: No diagnosis found.  Past Psychiatric History:  I have reviewed the patient's psychiatry history in detail and updated the patient record. Outpatient: She was evaluated for ADD in [facility] in 2014; started on Adderall since then. Psychiatry admission: denies Previous suicide attempt: denies Past trials of medication: Celexa, Wellbutrin ("mean"), Prozac, Effexor, Mirtazapine, duloxetine, nortriptyline (dry mouth), Adderall (4 years), gabapentin, Lyrica (swelling), lorazepam, valium  Past Medical History:  Past Medical History:  Diagnosis Date  . ADHD (attention deficit hyperactivity disorder)   . Arthritis   . Back pain   . Broken femur (HCC) 08/27/12  . Migraine   . Osteoarthritis     Past Surgical History:  Procedure Laterality Date  . BACK SURGERY    . CERVICAL BIOPSY  W/ LOOP ELECTRODE EXCISION  1990's   in [place], [place]  . ENDOMETRIAL BIOPSY  04-05-05 and 02-12-06   04-05-05 benign but too rapid growth. RXd Provera and repeat Bx 83mo. 02-12-06 Bx benign--Dr. Leda Quail  . FEMUR IM NAIL Left 08/27/2012   Procedure: INTRAMEDULLARY (IM) RETROGRADE FEMORAL NAILING;  Surgeon: Kathryne Hitch, MD;  Location: [facility] OR;  Service: Orthopedics;  Laterality: Left;  . HEEL SPUR SURGERY    . IM NAILING FEMORAL SHAFT FRACTURE Left 08/27/2012   Dr Magnus Ivan  . SHOULDER SURGERY Left   . TOTAL KNEE ARTHROPLASTY     x2  . TUBAL LIGATION    . VAGINAL HYSTERECTOMY      Family Psychiatric History: I have reviewed the patient's family history in detail and updated the patient record.  Family History:  Family History  Problem Relation Age of Onset  . Cancer Mother   . Breast cancer Mother   . Bipolar disorder Mother   . Heart failure Father   . Cancer Father   . Cancer Other   . Heart failure Other   . Bipolar disorder Sister     Social  History:  Social History   Socioeconomic History  . Marital status: Single    Spouse name: Not on file  . Number of children: Not on file  . Years of education: Not on file  . Highest education level: Not on file  Occupational History  . Not on file  Social Needs  . Financial resource strain: Not on file  . Food insecurity:    Worry: Not on file    Inability: Not on file  . Transportation needs:    Medical: Not on file    Non-medical: Not on file  Tobacco Use  . Smoking status: Former Smoker    Years: 10.00    Types: Cigarettes    Last attempt to quit: 08/16/2000    Years since quitting: 16.9  . Smokeless tobacco: Never Used  Substance and Sexual Activity  . Alcohol use: Yes    Alcohol/week: 0.0 oz    Comment: rarely  . Drug use: No  . Sexual activity: Not Currently    Partners: Female  Lifestyle  . Physical activity:    Days per week: Not on file    Minutes per session: Not on file  . Stress: Not on file  Relationships  . Social connections:    Talks on phone: Not on file    Gets together: Not on file    Attends religious service: Not on file    Active member of club or  organization: Not on file    Attends meetings of clubs or organizations: Not on file    Relationship status: Not on file  Other Topics Concern  . Not on file  Social History Narrative  . Not on file    Allergies:  Allergies  Allergen Reactions  . Wellbutrin [Bupropion]     Anger issues  . Lyrica [Pregabalin] Swelling    Leg swelling    Metabolic Disorder Labs: Lab Results  Component Value Date   HGBA1C 5.6 07/22/2014   MPG 103 02/16/2013   MPG 111 07/24/2010   No results found for: PROLACTIN Lab Results  Component Value Date   CHOL 195 03/15/2017   TRIG 119 03/15/2017   HDL 55 03/15/2017   CHOLHDL 3.5 03/15/2017   VLDL 13 07/10/2012   LDLCALC 116 (H) 03/15/2017   LDLCALC 108 (H) 01/12/2016   Lab Results  Component Value Date   TSH 4.104 06/24/2013   TSH 4.142 01/07/2013     Therapeutic Level Labs: No results found for: LITHIUM No results found for: VALPROATE No components found for:  CBMZ  Current Medications: Current Outpatient Medications  Medication Sig Dispense Refill  . amphetamine-dextroamphetamine (ADDERALL XR) 30 MG 24 hr capsule 2 qam 60 capsule 0  . clobetasol ointment (TEMOVATE) 0.05 %     . Diclofenac Sodium CR 100 MG 24 hr tablet TAKE 1 TABLET BY MOUTH TWICE DAILY. TAKE WITH FOOD. 60 tablet 6  . DULoxetine (CYMBALTA) 60 MG capsule Take 2 capsules (120 mg total) by mouth daily. 180 capsule 0  . omeprazole (PRILOSEC) 40 MG capsule Take 40 mg by mouth daily.    Marland Kitchen. oxyCODONE-acetaminophen (PERCOCET) 10-325 MG tablet Take 1 tablet by mouth 3 (three) times daily as needed for pain. 75 tablet 0  . traZODone (DESYREL) 100 MG tablet Take 100 mg by mouth at bedtime. 2 tablets at bedtimr     No current facility-administered medications for this visit.      Musculoskeletal: Strength & Muscle Tone: within normal limits Gait & Station: normal Patient leans: N/A  Psychiatric Specialty Exam: ROS  Last menstrual period 03/18/2004.There is no height or weight on file to calculate BMI.  General Appearance: Fairly Groomed  Eye Contact:  Good  Speech:  Clear and Coherent  Volume:  Normal  Mood:  {BHH MOOD:22306}  Affect:  {Affect (PAA):22687}  Thought Process:  Coherent  Orientation:  Full (Time, Place, and Person)  Thought Content: Logical   Suicidal Thoughts:  {ST/HT (PAA):22692}  Homicidal Thoughts:  {ST/HT (PAA):22692}  Memory:  Immediate;   Good  Judgement:  {Judgement (PAA):22694}  Insight:  {Insight (PAA):22695}  Psychomotor Activity:  Normal  Concentration:  Concentration: Good and Attention Span: Good  Recall:  Good  Fund of Knowledge: Good  Language: Good  Akathisia:  No  Handed:  Right  AIMS (if indicated): not done  Assets:  Communication Skills Desire for Improvement  ADL's:  Intact  Cognition: WNL  Sleep:  {BHH  GOOD/FAIR/POOR:22877}   Screenings: PHQ2-9     Clinical Support from [facility]/17/2017 [facility] Visit from [facility]/20/2017 [facility] Office Visit from 05/08/2017 [facility] Office Visit from [facility]2017 [facility] Office Visit from 04/15/2017 in [facility]  PHQ-2 Total Score  0  0  0  0  0       Assessment and Plan:  Lauren Greene is a 52 y.o. year old female with a history of depression, ADHD per chart, anxiety, hyperhydrosis, chronic pain , who presents for follow up appointment for No diagnosis found.  # MDD, moderate, recurrent without psychotic features  Patient reports occasional episodes of depression in the setting of being unable to have pain provider and discordance with her son.  Will continue duloxetine to target neurovegetative symptoms and pain while monitoring hypertension.  Will consider adjunctive treatment in the future if any worsening in her neurovegetative symptoms.  Will continue trazodone as needed for insomnia.  Will continue Valium as needed for anxiety.  Discussed healthy boundary.  Validated her demoralization from pain.  Discussed behavioral activation.   # ADHD She was diagnosed with ADHD per report and has been on Adderall by her PCP.  Will continue to monitor any side effect.   Plan 1 Continue duloxetine 120 mg daily  2. Continue Trazodone 200 mg at night as needed for sleep 3. Continue valium 2 mg daily as needed for anxiety (she declines refill at this time) 4.Return to clinic in three monthsfor 30 mins (Patient is on Adderall XR 30 mg daily prescribed by her PCP)  The patient demonstrates the following risk factors for suicide: Chronic risk factors for suicide include: psychiatric disorder of depressionand chronic pain. Acute risk factorsfor suicide  include: loss (financial, interpersonal, professional). Protective factorsfor this patient include: responsibility to others (children, family), coping skills and hope for the future. Considering these factors, the overall suicide risk at this point appears to be low. Patient isappropriate for outpatient follow up.    Neysa Hotter, MD 07/08/2017, 4:25 PM

## 2017-07-09 ENCOUNTER — Ambulatory Visit (HOSPITAL_COMMUNITY): Payer: Self-pay | Admitting: Psychiatry

## 2017-07-10 ENCOUNTER — Encounter (HOSPITAL_COMMUNITY): Payer: Self-pay

## 2017-07-10 ENCOUNTER — Ambulatory Visit (HOSPITAL_COMMUNITY): Payer: Self-pay | Admitting: Psychiatry

## 2017-07-10 NOTE — Therapy (Signed)
[place], [place], [address] Phone: [phone]   Fax:  [phone]  Patient Details  Name: Lauren Greene MRN: [id number] Date of Birth: 06-11-1965 Referring Provider:  No ref. provider found  Encounter Date: 07/10/2017   PHYSICAL THERAPY DISCHARGE SUMMARY  Visits from Start of Care: 2  Current functional level related to goals / functional outcomes: Date of last visit : 05/14/17. Patient is being discharged due to not returning since last visit. Below are the goals formed at initial evaluation that were not met and the clinical impression from her last session.    Plan - 05/14/17 1523    Clinical Impression Statement  - Teena Irani, PTA/CLT   PT was late for appt today.  Reveiwed goals per initial evaluation and intiated HEP to help strengthen LE's and core today.  PT able to complete most without difficulty.  Required AAROM with hamstring curls as unable to complete without foot falling inward due to weakness.  Unable to complete SLR in prone as too weak.  Began with heelsqueezes and bridge to help strengthen glutes.  PT able to complete all exercises without increase in pain.  Instructed to complete exericses 2X daily.  Pt requesting to only come 1X week right now due to financial reasons.    Clinical Impression from Evaluation: 05/07/2017 - Kipp Brood, PT, DPT  "[name] presents for initial PT evaluation for low back pain and difficulties with gait and balance. She has a significant history of falls resulting in injury and reports worsening back pain over the last 4 years following a fall with a fracture to her left femur. Her current impairments include decreased bil LE strength, decreased balance, impaired posture, decreased activity tolerance, impaired gait and mobility, hyperactive muscles, and pain. She will benefit from skilled PT services to address current impairments and progress towards goals  to improve function and overall QOL through mobility"     Remaining deficits: PT Short Term Goals - 05/07/17 1302            PT SHORT TERM GOAL #1   Title  Patient will be independent with HEP to improve functional strength and balance.    Time  3    Period  Weeks    Status  New    Target Date  05/28/17        PT SHORT TERM GOAL #2   Title  Patient will improve MMT for Bil LE by 1/2 grade to improve functional strength for improved gait and mobility.    Time  3    Period  Weeks    Status  New        PT SHORT TERM GOAL #3   Title  Patient will perform SLS for Bil LE for 10 seconds each to demosntrate improved balance for greater ease with gait and stair ambulation.    Time  3    Period  Weeks    Status  New       PT Long Term Goals - 05/07/17 1451            PT LONG TERM GOAL #1   Title  Patient will improve MMT for Bil LE by 1 grade to improve functional strength for improved gait and mobility.    Time  6    Period  Weeks    Status  New    Target Date  06/18/17        PT LONG  TERM GOAL #2   Title  Patient will ambulate at 1.0 m/s during 3 MWT to demonstrate decreased risk of falling and improve gait to place her in a safe community ambulator category.    Time  6    Period  Weeks    Status  New        PT LONG TERM GOAL #3   Title  Patient will perform 5x sit to stand in 13 seconds or less to demosntrate improved functional Bil LE strength for greater performance with daily activies.    Time  6    Period  Weeks    Status  New        PT LONG TERM GOAL #4   Title  Patient will improve FOTO score by 10% to indicate significant improvement in functiona dn decresae self reported limitaion due to low back pain.    Time  6    Period  Weeks    Status  New       Education / Equipment: Patient educated on goals and issued a HEP for proximal and core strengthening. Educated on attendance policy and no-show  policy. Plan: Patient agrees to discharge.  Patient goals were not met. Patient is being discharged due to not returning since the last visit.  ?????     Kipp Brood, PT, DPT Physical Therapist with [facility]  07/10/2017 9:49 AM    [place], [place], [address] Phone: [phone]   Fax:  [phone]

## 2017-07-11 ENCOUNTER — Other Ambulatory Visit: Payer: Self-pay | Admitting: Family Medicine

## 2017-07-11 ENCOUNTER — Ambulatory Visit (INDEPENDENT_AMBULATORY_CARE_PROVIDER_SITE_OTHER): Payer: PPO | Admitting: Family Medicine

## 2017-07-11 ENCOUNTER — Encounter: Payer: Self-pay | Admitting: Family Medicine

## 2017-07-11 VITALS — BP 124/72 | Ht 66.0 in | Wt 244.6 lb

## 2017-07-11 DIAGNOSIS — F988 Other specified behavioral and emotional disorders with onset usually occurring in childhood and adolescence: Secondary | ICD-10-CM | POA: Diagnosis not present

## 2017-07-11 MED ORDER — AMPHETAMINE-DEXTROAMPHET ER 30 MG PO CP24: ORAL_CAPSULE | ORAL | 0 refills | Status: DC

## 2017-07-11 NOTE — Progress Notes (Signed)
   Subjective:    Patient ID: Lauren Greene, female    DOB: 01/16/1966, 52 y.o.   MRN: [id number]  Hypertension  This is a chronic problem. The current episode started more than 1 year ago. Pertinent negatives include no chest pain. Risk factors for coronary artery disease include post-menopausal state. Past treatments include lifestyle changes. There are no compliance problems.    Patient also needs refills on ADHD medication Patient with adult ADD Takes medication on a regular basis Tries to watch her diet Difficult for her to stay active because of severe osteoarthritis Patient also has chronic pain she is on pain medication States pain medicine not adequately taking care of it but she will discuss this with her pain management doctor  Review of Systems  Constitutional: Negative for activity change, appetite change and fatigue.  HENT: Negative for congestion.   Respiratory: Negative for cough.   Cardiovascular: Negative for chest pain.  Gastrointestinal: Negative for abdominal pain.  Endocrine: Negative for polydipsia and polyphagia.  Skin: Negative for color change.  Neurological: Negative for weakness.  Psychiatric/Behavioral: Negative for confusion.       Objective:   Physical Exam  Constitutional: She appears well-developed and well-nourished.  HENT:  Head: Normocephalic.  Cardiovascular: Normal rate, regular rhythm and normal heart sounds.  No murmur heard. Pulmonary/Chest: Effort normal and breath sounds normal.  Neurological: She is alert.  Skin: Skin is warm and dry.  Psychiatric: She has a normal mood and affect.  Vitals reviewed.  She does relate that ADD medicine does help her she seems to be tolerating well and her blood pressure is good today we will go ahead with current treatment drug registry was checked       Assessment & Plan:  Adult ADD 3 prescriptions given Follow-up in 3 months Patient is under the care of pain management She has been  encouraged to continue this

## 2017-07-14 NOTE — Progress Notes (Deleted)
BH MD/PA/NP OP Progress Note  07/14/2017 1:15 PM Lauren Greene  MRN:  [id number]  Chief Complaint:  HPI:   Per PMP,  Diazepam last filled on 12/04/2016 On amphetamine, oxycodone   Visit Diagnosis: No diagnosis found.  Past Psychiatric History:  I have reviewed the patient's psychiatry history in detail and updated the patient record. Outpatient: She was evaluated for ADD in [facility] in 2014; started on Adderall since then. Psychiatry admission: denies Previous suicide attempt: denies Past trials of medication: Celexa, Wellbutrin ("mean"), Prozac, Effexor, Mirtazapine, duloxetine, nortriptyline (dry mouth), Adderall (4 years), gabapentin, Lyrica (swelling), lorazepam, valium  Past Medical History:  Past Medical History:  Diagnosis Date  . ADHD (attention deficit hyperactivity disorder)   . Arthritis   . Back pain   . Broken femur (HCC) 08/27/12  . Migraine   . Osteoarthritis     Past Surgical History:  Procedure Laterality Date  . BACK SURGERY    . CERVICAL BIOPSY  W/ LOOP ELECTRODE EXCISION  1990's   in [place], [place]  . ENDOMETRIAL BIOPSY  04-05-05 and 02-12-06   04-05-05 benign but too rapid growth. RXd Provera and repeat Bx 52mo. 02-12-06 Bx benign--Dr. Leda Quail  . FEMUR IM NAIL Left 08/27/2012   Procedure: INTRAMEDULLARY (IM) RETROGRADE FEMORAL NAILING;  Surgeon: Kathryne Hitch, MD;  Location: [facility] OR;  Service: Orthopedics;  Laterality: Left;  . HEEL SPUR SURGERY    . IM NAILING FEMORAL SHAFT FRACTURE Left 08/27/2012   Dr Magnus Ivan  . SHOULDER SURGERY Left   . TOTAL KNEE ARTHROPLASTY     x2  . TUBAL LIGATION    . VAGINAL HYSTERECTOMY      Family Psychiatric History:  I have reviewed the patient's family history in detail and updated the patient record.  Family History:  Family History  Problem Relation Age of Onset  . Cancer Mother   . Breast cancer Mother   . Bipolar disorder Mother   . Heart failure Father   . Cancer Father   . Cancer  Other   . Heart failure Other   . Bipolar disorder Sister     Social History:  Social History   Socioeconomic History  . Marital status: Single    Spouse name: Not on file  . Number of children: Not on file  . Years of education: Not on file  . Highest education level: Not on file  Occupational History  . Not on file  Social Needs  . Financial resource strain: Not on file  . Food insecurity:    Worry: Not on file    Inability: Not on file  . Transportation needs:    Medical: Not on file    Non-medical: Not on file  Tobacco Use  . Smoking status: Former Smoker    Years: 10.00    Types: Cigarettes    Last attempt to quit: 08/16/2000    Years since quitting: 16.9  . Smokeless tobacco: Never Used  Substance and Sexual Activity  . Alcohol use: Yes    Alcohol/week: 0.0 oz    Comment: rarely  . Drug use: No  . Sexual activity: Not Currently    Partners: Female  Lifestyle  . Physical activity:    Days per week: Not on file    Minutes per session: Not on file  . Stress: Not on file  Relationships  . Social connections:    Talks on phone: Not on file    Gets together: Not on file  Attends religious service: Not on file    Active member of club or organization: Not on file    Attends meetings of clubs or organizations: Not on file    Relationship status: Not on file  Other Topics Concern  . Not on file  Social History Narrative  . Not on file    Allergies:  Allergies  Allergen Reactions  . Wellbutrin [Bupropion]     Anger issues  . Lyrica [Pregabalin] Swelling    Leg swelling    Metabolic Disorder Labs: Lab Results  Component Value Date   HGBA1C 5.6 07/22/2014   MPG 103 02/16/2013   MPG 111 07/24/2010   No results found for: PROLACTIN Lab Results  Component Value Date   CHOL 195 03/15/2017   TRIG 119 03/15/2017   HDL 55 03/15/2017   CHOLHDL 3.5 03/15/2017   VLDL 13 07/10/2012   LDLCALC 116 (H) 03/15/2017   LDLCALC 108 (H) 01/12/2016   Lab  Results  Component Value Date   TSH 4.104 06/24/2013   TSH 4.142 01/07/2013    Therapeutic Level Labs: No results found for: LITHIUM No results found for: VALPROATE No components found for:  CBMZ  Current Medications: Current Outpatient Medications  Medication Sig Dispense Refill  . amphetamine-dextroamphetamine (ADDERALL XR) 30 MG 24 hr capsule 2 qam 60 capsule 0  . clobetasol ointment (TEMOVATE) 0.05 %     . Diclofenac Sodium CR 100 MG 24 hr tablet TAKE 1 TABLET BY MOUTH TWICE DAILY. TAKE WITH FOOD. 60 tablet 6  . DULoxetine (CYMBALTA) 60 MG capsule Take 2 capsules (120 mg total) by mouth daily. 180 capsule 0  . omeprazole (PRILOSEC) 40 MG capsule TAKE (1) CAPSULE BY MOUTH ONCE DAILY. 30 capsule 12  . oxyCODONE-acetaminophen (PERCOCET) 10-325 MG tablet Take 1 tablet by mouth 3 (three) times daily as needed for pain. 75 tablet 0  . traZODone (DESYREL) 100 MG tablet Take 100 mg by mouth at bedtime. 2 tablets at bedtimr     No current facility-administered medications for this visit.      Musculoskeletal: Strength & Muscle Tone: within normal limits Gait & Station: normal Patient leans: N/A  Psychiatric Specialty Exam: ROS  Last menstrual period 03/18/2004.There is no height or weight on file to calculate BMI.  General Appearance: Fairly Groomed  Eye Contact:  Good  Speech:  Clear and Coherent  Volume:  Normal  Mood:  {BHH MOOD:22306}  Affect:  {Affect (PAA):22687}  Thought Process:  Coherent  Orientation:  Full (Time, Place, and Person)  Thought Content: Logical   Suicidal Thoughts:  {ST/HT (PAA):22692}  Homicidal Thoughts:  {ST/HT (PAA):22692}  Memory:  Immediate;   Good  Judgement:  {Judgement (PAA):22694}  Insight:  {Insight (PAA):22695}  Psychomotor Activity:  Normal  Concentration:  Concentration: Good and Attention Span: Good  Recall:  Good  Fund of Knowledge: Good  Language: Good  Akathisia:  No  Handed:  Right  AIMS (if indicated): not done  Assets:   Communication Skills Desire for Improvement  ADL's:  Intact  Cognition: {chl bhh cognition:304700322}  Sleep:  {BHH GOOD/FAIR/POOR:22877}   Screenings: PHQ2-9     Clinical Support from 06/17/2017 in [facility] Office Visit from 05/20/2017 in [facility] Office Visit from 05/08/2017 in [facility] Office Visit from 04/16/2017 in [facility] Office Visit from 04/15/2017 in [facility]  PHQ-2 Total Score  0  0  0  0  0       Assessment and Plan:  Lauren Greene is a 52 y.o. year old female with a history of depression, ADHD per chart, hyperhydrosis, chronic pain , who presents for follow up appointment for No diagnosis found.  # MDD, moderate, recurrent without psychotic features  Patient reports occasional episodes of depression in the setting of being unable to have pain provider and discordance with her son.  Will continue duloxetine to target neurovegetative symptoms and pain while monitoring hypertension.  Will consider adjunctive treatment in the future if any worsening in her neurovegetative symptoms.  Will continue trazodone as needed for insomnia.  Will continue Valium as needed for anxiety.  Discussed healthy boundary.  Validated her demoralization from pain.  Discussed behavioral activation.   # ADHD She was diagnosed with ADHD per report and has been on Adderall by her PCP.  Will continue to monitor any side effect.   Plan 1 Continue duloxetine 120 mg daily  2. Continue Trazodone 200 mg at night as needed for sleep 3. Continue valium 2 mg daily as needed for anxiety (she declines refill at this time) 4.Return to clinic in three monthsfor 30 mins (Patient is on Adderall XR 30 mg daily prescribed by her PCP)  The patient demonstrates the following risk factors for suicide: Chronic risk  factors for suicide include: psychiatric disorder of depressionand chronic pain. Acute risk factorsfor suicide include: loss (financial, interpersonal, professional). Protective factorsfor this patient include: responsibility to others (children, family), coping skills and hope for the future. Considering these factors, the overall suicide risk at this point appears to be low. Patient isappropriate for outpatient follow up.    Neysa Hotter, MD 07/14/2017, 1:15 PM

## 2017-07-15 ENCOUNTER — Ambulatory Visit (HOSPITAL_COMMUNITY): Payer: Self-pay | Admitting: Psychiatry

## 2017-07-15 ENCOUNTER — Other Ambulatory Visit: Payer: Self-pay

## 2017-07-15 ENCOUNTER — Ambulatory Visit
Payer: PPO | Attending: Student in an Organized Health Care Education/Training Program | Admitting: Student in an Organized Health Care Education/Training Program

## 2017-07-15 ENCOUNTER — Encounter: Payer: Self-pay | Admitting: Student in an Organized Health Care Education/Training Program

## 2017-07-15 VITALS — BP 150/97 | HR 83 | Temp 98.2°F | Resp 18 | Ht 68.0 in | Wt 240.0 lb

## 2017-07-15 DIAGNOSIS — Z79899 Other long term (current) drug therapy: Secondary | ICD-10-CM | POA: Diagnosis not present

## 2017-07-15 DIAGNOSIS — M48061 Spinal stenosis, lumbar region without neurogenic claudication: Secondary | ICD-10-CM | POA: Insufficient documentation

## 2017-07-15 DIAGNOSIS — M797 Fibromyalgia: Secondary | ICD-10-CM

## 2017-07-15 DIAGNOSIS — Z6836 Body mass index (BMI) 36.0-36.9, adult: Secondary | ICD-10-CM | POA: Diagnosis not present

## 2017-07-15 DIAGNOSIS — K219 Gastro-esophageal reflux disease without esophagitis: Secondary | ICD-10-CM | POA: Diagnosis not present

## 2017-07-15 DIAGNOSIS — F909 Attention-deficit hyperactivity disorder, unspecified type: Secondary | ICD-10-CM | POA: Insufficient documentation

## 2017-07-15 DIAGNOSIS — G894 Chronic pain syndrome: Secondary | ICD-10-CM

## 2017-07-15 DIAGNOSIS — F429 Obsessive-compulsive disorder, unspecified: Secondary | ICD-10-CM | POA: Diagnosis not present

## 2017-07-15 DIAGNOSIS — F209 Schizophrenia, unspecified: Secondary | ICD-10-CM | POA: Insufficient documentation

## 2017-07-15 DIAGNOSIS — M47816 Spondylosis without myelopathy or radiculopathy, lumbar region: Secondary | ICD-10-CM

## 2017-07-15 DIAGNOSIS — Z79891 Long term (current) use of opiate analgesic: Secondary | ICD-10-CM | POA: Diagnosis not present

## 2017-07-15 DIAGNOSIS — Z96653 Presence of artificial knee joint, bilateral: Secondary | ICD-10-CM | POA: Diagnosis not present

## 2017-07-15 DIAGNOSIS — Z5181 Encounter for therapeutic drug level monitoring: Secondary | ICD-10-CM | POA: Diagnosis not present

## 2017-07-15 DIAGNOSIS — M51369 Other intervertebral disc degeneration, lumbar region without mention of lumbar back pain or lower extremity pain: Secondary | ICD-10-CM

## 2017-07-15 DIAGNOSIS — M5136 Other intervertebral disc degeneration, lumbar region: Secondary | ICD-10-CM | POA: Insufficient documentation

## 2017-07-15 DIAGNOSIS — Z87891 Personal history of nicotine dependence: Secondary | ICD-10-CM | POA: Insufficient documentation

## 2017-07-15 MED ORDER — OXYCODONE-ACETAMINOPHEN 10-325 MG PO TABS: 1.0000 | ORAL_TABLET | Freq: Three times a day (TID) | ORAL | 0 refills | Status: DC | PRN

## 2017-07-15 MED ORDER — METHOCARBAMOL 500 MG PO TABS: 500.0000 mg | ORAL_TABLET | Freq: Three times a day (TID) | ORAL | 3 refills | Status: DC | PRN

## 2017-07-15 NOTE — Progress Notes (Signed)
Patient's Name: Lauren Greene  MRN: [id number]  Referring Provider: Kathyrn Drown, MD  DOB: Feb 27, 1966  PCP: Kathyrn Drown, MD  DOS: 07/15/2017  Note by: Gillis Santa, MD  Service setting: Ambulatory outpatient  Specialty: Interventional Pain Management  Location: [facility] (AMB) Pain Management Facility    Patient type: Established   Primary Reason(s) for Visit: Encounter for prescription drug management. (Level of risk: moderate)  CC: Back Pain and Shoulder Pain  HPI  Lauren Greene is a 52 y.o. year old, female patient, who comes today for a medication management evaluation. She has ADD (attention deficit disorder); Periprosthetic fracture around internal prosthetic left knee joint; Depression, major, in remission ([place]); Chronic arthralgias of knees and hips; Chronic back pain; Arthritis; Myalgia and myositis; Elevated blood pressure; Morbid obesity ([place]); Osteoarthritis; Hyperhydrosis disorder; Moderate episode of recurrent major depressive disorder ([place]); Bunion, right; Lumbar degenerative disc disease; GERD (gastroesophageal reflux disease); Chronic, continuous use of opioids; and Fibromyalgia on their problem list. Her primarily concern today is the Back Pain and Shoulder Pain  Pain Assessment: Location: Lower Back Radiating: Denies  Onset: More than a month ago Duration: Chronic pain Quality: Constant, Aching, Burning, Sharp Severity: 8 /10 (self-reported pain score)  Note: Reported level is inconsistent with clinical observations. Clinically the patient looks like a 3/10 A 3/10 is viewed as "Moderate" and described as significantly interfering with activities of daily living (ADL). It becomes difficult to feed, bathe, get dressed, get on and off the toilet or to perform personal hygiene functions. Difficult to get in and out of bed or a chair without assistance. Very distracting. With effort, it can be ignored when deeply involved in activities. Information on the proper use of the pain  scale provided to the patient today. When using our objective Pain Scale, levels between 6 and 10/10 are said to belong in an emergency room, as it progressively worsens from a 6/10, described as severely limiting, requiring emergency care not usually available at an outpatient pain management facility. At a 6/10 level, communication becomes difficult and requires great effort. Assistance to reach the emergency department may be required. Facial flushing and profuse sweating along with potentially dangerous increases in heart rate and blood pressure will be evident. Effect on ADL: Household chores and yardwork Timing: Constant Modifying factors: Pain medications, ice packs   Lauren Greene was last scheduled for an appointment on 06/17/2017 for medication management. During today's appointment we reviewed Lauren Greene's chronic pain status, as well as her outpatient medication regimen.  The patient  reports that she does not use drugs. Her body mass index is 36.49 kg/m.  Further details on both, my assessment(s), as well as the proposed treatment plan, please see below.  Controlled Substance Pharmacotherapy Assessment REMS (Risk Evaluation and Mitigation Strategy)  Analgesic: Percocet 10 mg 3 times daily as needed MME/day: 45 mg/day.  Janne Napoleon, RN  07/15/2017 12:08 PM  Sign at close encounter Nursing Pain Medication Assessment:  Safety precautions to be maintained throughout the outpatient stay will include: orient to surroundings, keep bed in low position, maintain call bell within reach at all times, provide assistance with transfer out of bed and ambulation.  Medication Inspection Compliance: Pill count conducted under aseptic conditions, in front of the patient. Neither the pills nor the bottle was removed from the patient's sight at any time. Once count was completed pills were immediately returned to the patient in their original bottle.  Medication: Oxycodone/APAP Pill/Patch Count: 0  of 75 pills  remain Pill/Patch Appearance: Markings consistent with prescribed medication Bottle Appearance: Standard pharmacy container. Clearly labeled. Filled Date: 04 / 02 / 2019   Last Medication intake:  07/11/2017  Safety precautions to be maintained throughout the outpatient stay will include: orient to surroundings, keep bed in low position, maintain call bell within reach at all times, provide assistance with transfer out of bed and ambulation.    Pharmacokinetics: Liberation and absorption (onset of action): WNL Distribution (time to peak effect): WNL Metabolism and excretion (duration of action): WNL         Pharmacodynamics: Desired effects: Analgesia: Lauren Greene reports >50% benefit. Functional ability: Patient reports that medication allows her to accomplish basic ADLs Clinically meaningful improvement in function (CMIF): Sustained CMIF goals met Perceived effectiveness: Described as relatively effective, allowing for increase in activities of daily living (ADL) Undesirable effects: Side-effects or Adverse reactions: None reported Monitoring: [place] PMP: Online review of the past 2-monthperiod conducted. Compliant with practice rules and regulations Last UDS on record: Summary  Date Value Ref Range Status  06/17/2017 FINAL  Final    Comment:    ==================================================================== TOXASSURE SELECT 13 (MW) ==================================================================== Test                             Result       Flag       Units Drug Present and Declared for Prescription Verification   Amphetamine                    >4292        EXPECTED   ng/mg creat    Amphetamine is available as a schedule II prescription drug. Drug Present not Declared for Prescription Verification   Buprenorphine                  2            UNEXPECTED ng/mg creat   Norbuprenorphine               4            UNEXPECTED ng/mg creat    Source of  buprenorphine is a scheduled prescription medication.    Norbuprenorphine is an expected metabolite of buprenorphine. Drug Absent but Declared for Prescription Verification   Oxycodone                      Not Detected UNEXPECTED ng/mg creat ==================================================================== Test                      Result    Flag   Units      Ref Range   Creatinine              233              mg/dL      >=20 ==================================================================== Declared Medications:  The flagging and interpretation on this report are based on the  following declared medications.  Unexpected results may arise from  inaccuracies in the declared medications.  **Note: The testing scope of this panel includes these medications:  Amphetamine (Adderall XR)  Oxycodone (Oxycodone Acetaminophen)  **Note: The testing scope of this panel does not include following  reported medications:  Acetaminophen (Oxycodone Acetaminophen)  Clobetasol  Diclofenac  Duloxetine  Omeprazole  Trazodone ==================================================================== For clinical consultation, please call [phone]====    UDS interpretation: Compliant  Medication Assessment Form: Reviewed. Patient indicates being compliant with therapy Treatment compliance: Compliant Risk Assessment Profile: Aberrant behavior: See prior evaluations. None observed or detected today Comorbid factors increasing risk of overdose: See prior notes. No additional risks detected today Risk of substance use disorder (SUD): Low Opioid Risk Tool - 07/15/17 1203      Family History of Substance Abuse   Alcohol  Negative    Illegal Drugs  Negative    Rx Drugs  Negative      Personal History of Substance Abuse   Alcohol  Negative    Illegal Drugs  Negative    Rx Drugs  Negative      Age   Age between 51-45 years    No      History of Preadolescent Sexual Abuse   History of Preadolescent Sexual Abuse  Negative or Female      Psychological Disease   Psychological Disease  Negative    ADD  Positive    OCD  Negative    Bipolar  Negative    Schizophrenia  Negative    Depression  Negative      Total Score   Opioid Risk Tool Scoring  0    Opioid Risk Interpretation  Low Risk      ORT Scoring interpretation table:  Score <3 = Low Risk for SUD  Score between 4-7 = Moderate Risk for SUD  Score >8 = High Risk for Opioid Abuse   Risk Mitigation Strategies:  Patient Counseling: Covered Patient-Prescriber Agreement (PPA): Present and active  Notification to other healthcare providers: Done  Pharmacologic Plan: Patient instructed to take Percocet 10 mg twice daily to 3 times daily as needed, last prescription was for 75 tablets.  Patient is doing more activity and work outside is having to take a dose in the evening.  Will increase to Percocet 10 mg 3 times daily as needed, quantity 66-month  No further dose escalation beyond this.             Laboratory Chemistry  Inflammation Markers (CRP: Acute Phase) (ESR: Chronic Phase) Lab Results  Component Value Date   CRP 1.7 08/03/2014   ESRSEDRATE 2 08/03/2014                         Rheumatology Markers Lab Results  Component Value Date   RF 12 06/24/2013   ANA Negative 08/03/2014                        Renal Function Markers Lab Results  Component Value Date   BUN 13 04/15/2017   CREATININE 0.58 04/15/2017   GFRAA 123 04/15/2017   GFRNONAA 106 04/15/2017                              Hepatic Function Markers Lab Results  Component Value Date   AST 26 03/15/2017   ALT 26 03/15/2017   ALBUMIN 4.3 03/15/2017   ALKPHOS 117 03/15/2017   LIPASE 25 01/12/2016                        Electrolytes Lab Results  Component Value Date   NA 142 04/15/2017   K 4.6 04/15/2017   CL 102 04/15/2017   CALCIUM 9.5 04/15/2017  Neuropathy Markers Lab Results  Component Value Date   HGBA1C 5.6 07/22/2014                        Bone Pathology Markers Lab Results  Component Value Date   VD25OH 31 01/07/2013                         Coagulation Parameters Lab Results  Component Value Date   INR 1.04 02/16/2013   LABPROT 13.6 02/16/2013   APTT 29 02/16/2013   PLT 274 01/12/2016                        Cardiovascular Markers Lab Results  Component Value Date   CKTOTAL 139 08/03/2014   CKMB 2.8 06/24/2013   HGB 14.6 01/12/2016   HCT 43.4 01/12/2016                         CA Markers No results found for: CEA, CA125, LABCA2                      Note: Lab results reviewed.  Recent Diagnostic Imaging Results  MR Lumbar Spine W Wo Contrast CLINICAL DATA:  Back pain with right lower extremity pain. Prior lumbar surgery.  EXAM: MRI LUMBAR SPINE WITHOUT AND WITH CONTRAST  TECHNIQUE: Multiplanar and multiecho pulse sequences of the lumbar spine were obtained without and with intravenous contrast.  CONTRAST:  40m MULTIHANCE GADOBENATE DIMEGLUMINE 529 MG/ML IV SOLN  COMPARISON:  Lumbar spine MRI 02/17/2014  FINDINGS: Segmentation: Normal. The lowest disc space is considered to be L5-S1.  Alignment:  Grade 1 retrolisthesis at L1-2 and L2-3, unchanged.  Vertebrae: No acute compression fracture, facet edema or focal marrow lesion.  Conus medullaris: Extends to the L1 level and appears normal.  Paraspinal and other soft tissues: The visualized aorta, IVC and iliac vessels are normal. The visualized retroperitoneal organs and paraspinal soft tissues are normal.  Disc levels:  T10-T11: Evaluated on sagittal images only. Small disc herniation without spinal Greene or neural foraminal stenosis.  T11-T12: Evaluated on sagittal images only. Small disc herniation. No spinal Greene or neural foraminal stenosis.  T12-L1: Normal disc space and facet joints. No spinal Greene stenosis. No  neuroforaminal stenosis.  L1-L2: Narrowing of the intervertebral disc space with disc edema. Mild facet hypertrophy. Small disc osteophyte complex. No spinal Greene stenosis. Moderate bilateral, right worse than left, neuroforaminal stenosis. This is unchanged.  L2-L3: Diffuse disc bulge with mild narrowing of the lateral recesses. Mild unchanged spinal Greene stenosis. Moderate bilateral unchanged neuroforaminal stenosis.  L3-L4: Diffuse disc bulge with superimposed central protrusion, unchanged. Bilateral mild facet hypertrophy. Unchanged moderate spinal Greene stenosis. Moderate bilateral neural foraminal stenosis. This level is unchanged.  L4-L5: Diffuse disc bulge with narrowing of the left-greater-than-right lateral recesses. No spinal Greene stenosis. Mild right and moderate left neuroforaminal stenosis. This level is unchanged.  L5-S1: Left eccentric disc bulge and moderate bilateral facet hypertrophy. No spinal Greene stenosis. Moderate left neuroforaminal stenosis, unchanged.  IMPRESSION: 1. Worsened disc edema at L1-L2 with unchanged neuroforaminal stenosis, right greater than left. 2. Unchanged moderate spinal Greene stenosis at L3-L4. There is also moderate bilateral neural foraminal narrowing at this level, also unchanged. 3. Unchanged left eccentric disc bulge at L5-S1 narrowing the left neural foramen.  Electronically Signed   By: KUlyses JarredM.D.   On: 12/01/2015  02:19  Complexity Note: Imaging results reviewed. Results shared with Lauren Greene, using Layman's terms.                         Meds   Current Outpatient Medications:  .  amphetamine-dextroamphetamine (ADDERALL XR) 30 MG 24 hr capsule, 2 qam, Disp: 60 capsule, Rfl: 0 .  clobetasol ointment (TEMOVATE) 0.05 %, , Disp: , Rfl:  .  Diclofenac Sodium CR 100 MG 24 hr tablet, TAKE 1 TABLET BY MOUTH TWICE DAILY. TAKE WITH FOOD., Disp: 60 tablet, Rfl: 6 .  DULoxetine (CYMBALTA) 60 MG capsule, Take 2  capsules (120 mg total) by mouth daily., Disp: 180 capsule, Rfl: 0 .  methocarbamol (ROBAXIN) 500 MG tablet, Take 1 tablet (500 mg total) by mouth 3 (three) times daily as needed for muscle spasms., Disp: 90 tablet, Rfl: 3 .  omeprazole (PRILOSEC) 40 MG capsule, TAKE (1) CAPSULE BY MOUTH ONCE DAILY., Disp: 30 capsule, Rfl: 12 .  oxyCODONE-acetaminophen (PERCOCET) 10-325 MG tablet, Take 1 tablet by mouth 3 (three) times daily as needed for pain., Disp: 90 tablet, Rfl: 0 .  traZODone (DESYREL) 100 MG tablet, Take 100 mg by mouth at bedtime. 2 tablets at bedtimr, Disp: , Rfl:   ROS  Constitutional: Denies any fever or chills Gastrointestinal: No reported hemesis, hematochezia, vomiting, or acute GI distress Musculoskeletal: Denies any acute onset joint swelling, redness, loss of ROM, or weakness Neurological: No reported episodes of acute onset apraxia, aphasia, dysarthria, agnosia, amnesia, paralysis, loss of coordination, or loss of consciousness  Allergies  Lauren Greene is allergic to wellbutrin [bupropion] and lyrica [pregabalin].  [place]  Drug: Lauren Greene  reports that she does not use drugs. Alcohol:  reports that she drinks alcohol. Tobacco:  reports that she quit smoking about 16 years ago. Her smoking use included cigarettes. She quit after 10.00 years of use. She has never used smokeless tobacco. Medical:  has a past medical history of ADHD (attention deficit hyperactivity disorder), Arthritis, Back pain, Broken femur ([place]) (08/27/12), Migraine, and Osteoarthritis. Surgical: Lauren Greene  has a past surgical history that includes Total knee arthroplasty; Heel spur surgery; Back surgery; IM nailing femoral shaft fracture (Left, 08/27/2012); Femur IM nail (Left, 08/27/2012); Cervical biopsy w/ loop electrode excision (1990's); Endometrial biopsy (04-05-05 and 02-12-06); Tubal ligation; Shoulder surgery (Left); and Vaginal hysterectomy. Family: family history includes Bipolar disorder in her  mother and sister; Breast cancer in her mother; Cancer in her father, mother, and other; Heart failure in her father and other.  Constitutional Exam  General appearance: Well nourished, well developed, and well hydrated. In no apparent acute distress Vitals:   07/15/17 1156  BP: (!) 150/97  Pulse: 83  Resp: 18  Temp: 98.2 F (36.8 C)  SpO2: 100%  Weight: 240 lb (108.9 kg)  Height: '5\' 8"'$  (1.727 m)   BMI Assessment: Estimated body mass index is 36.49 kg/m as calculated from the following:   Height as of this encounter: '5\' 8"'$  (1.727 m).   Weight as of this encounter: 240 lb (108.9 kg).  BMI interpretation table: BMI level Category Range association with higher incidence of chronic pain  <18 kg/m2 Underweight   18.5-24.9 kg/m2 Ideal body weight   25-29.9 kg/m2 Overweight Increased incidence by 20%  30-34.9 kg/m2 Obese (Class I) Increased incidence by 68%  35-39.9 kg/m2 Severe obesity (Class II) Increased incidence by 136%  >40 kg/m2 Extreme obesity (Class III) Increased incidence by 254%   Patient's current  BMI Ideal Body weight  Body mass index is 36.49 kg/m. Ideal body weight: 63.9 kg (140 lb 14 oz) Adjusted ideal body weight: 81.9 kg (180 lb 8.4 oz)   BMI Readings from Last 4 Encounters:  07/15/17 36.49 kg/m  07/11/17 39.48 kg/m  06/17/17 36.80 kg/m  05/20/17 40.03 kg/m   Wt Readings from Last 4 Encounters:  07/15/17 240 lb (108.9 kg)  07/11/17 244 lb 9.6 oz (110.9 kg)  06/17/17 242 lb (109.8 kg)  05/20/17 248 lb (112.5 kg)  Psych/Mental status: Alert, oriented x 3 (person, place, & time)       Eyes: PERLA Respiratory: No evidence of acute respiratory distress  Cervical Spine Area Exam  Skin & Axial Inspection: No masses, redness, edema, swelling, or associated skin lesions Alignment: Symmetrical Functional ROM: Unrestricted ROM      Stability: No instability detected Muscle Tone/Strength: Functionally intact. No obvious neuro-muscular anomalies  detected. Sensory (Neurological): Unimpaired Palpation: No palpable anomalies              Upper Extremity (UE) Exam    Side: Right upper extremity  Side: Left upper extremity  Skin & Extremity Inspection: Skin color, temperature, and hair growth are WNL. No peripheral edema or cyanosis. No masses, redness, swelling, asymmetry, or associated skin lesions. No contractures.  Skin & Extremity Inspection: Skin color, temperature, and hair growth are WNL. No peripheral edema or cyanosis. No masses, redness, swelling, asymmetry, or associated skin lesions. No contractures.  Functional ROM: Unrestricted ROM          Functional ROM: Unrestricted ROM          Muscle Tone/Strength: Functionally intact. No obvious neuro-muscular anomalies detected.  Muscle Tone/Strength: Functionally intact. No obvious neuro-muscular anomalies detected.  Sensory (Neurological): Unimpaired          Sensory (Neurological): Unimpaired          Palpation: No palpable anomalies              Palpation: No palpable anomalies              Specialized Test(s): Deferred         Specialized Test(s): Deferred          Thoracic Spine Area Exam  Skin & Axial Inspection: No masses, redness, or swelling Alignment: Symmetrical Functional ROM: Unrestricted ROM Stability: No instability detected Muscle Tone/Strength: Functionally intact. No obvious neuro-muscular anomalies detected. Sensory (Neurological): Unimpaired Muscle strength & Tone: No palpable anomalies Lumbar Spine Area Exam  Skin & Axial Inspection: No masses, redness, or swelling Alignment: Symmetrical Functional ROM: Decreased ROM, bilaterally Stability: No instability detected Muscle Tone/Strength: Functionally intact. No obvious neuro-muscular anomalies detected. Sensory (Neurological): Articular pain pattern Palpation: No palpable anomalies       Provocative Tests: Lumbar Hyperextension and rotation test: Positive bilaterally for facet joint pain. Lumbar Lateral  bending test: Positive ipsilateral radicular pain, bilaterally. Positive for bilateral foraminal stenosis. Patrick's Maneuver: Positive for bilateral S-I arthralgia              Gait & Posture Assessment  Ambulation: Limited Gait: Antalgic Posture: Difficulty standing up straight, due to pain   Lower Extremity Exam    Side: Right lower extremity  Side: Left lower extremity  Skin & Extremity Inspection: Skin color, temperature, and hair growth are WNL. No peripheral edema or cyanosis. No masses, redness, swelling, asymmetry, or associated skin lesions. No contractures.  Skin & Extremity Inspection: Skin color, temperature, and hair growth are WNL. No peripheral edema or  cyanosis. No masses, redness, swelling, asymmetry, or associated skin lesions. No contractures.  Functional ROM: Unrestricted ROM          Functional ROM: Unrestricted ROM          Muscle Tone/Strength: Functionally intact. No obvious neuro-muscular anomalies detected.  Muscle Tone/Strength: Functionally intact. No obvious neuro-muscular anomalies detected.  Sensory (Neurological): Unimpaired  Sensory (Neurological): Unimpaired  Palpation: No palpable anomalies  Palpation: No palpable anomalies    Assessment  Primary Diagnosis & Pertinent Problem List: The primary encounter diagnosis was Lumbar spondylosis. Diagnoses of Lumbar degenerative disc disease, Lumbar facet arthropathy, Fibromyalgia, and Chronic pain syndrome were also pertinent to this visit.  Status Diagnosis  Persistent Persistent Controlled 1. Lumbar spondylosis   2. Lumbar degenerative disc disease   3. Lumbar facet arthropathy   4. Fibromyalgia   5. Chronic pain syndrome      General Recommendations: The pain condition that the patient suffers from is best treated with a multidisciplinary approach that involves an increase in physical activity to prevent de-conditioning and worsening of the pain cycle, as well as psychological counseling (formal  and/or informal) to address the co-morbid psychological affects of pain. Treatment will often involve judicious use of pain medications and interventional procedures to decrease the pain, allowing the patient to participate in the physical activity that will ultimately produce long-lasting pain reductions. The goal of the multidisciplinary approach is to return the patient to a higher level of overall function and to restore their ability to perform activities of daily living.  52 year old female with a history of morbid obesity, chronic back pain, bilateral knee pain secondary to bilateral total knee arthroplasty, back pain secondary to lumbar degenerative disc disease, multilevel lumbar disc herniations, facet arthropathy along with a diagnosis of fibromyalgia. Patient does have a history of a broken femur in June 2014. She is status post left shoulder surgery status post bilateral total knee arthroplasty. She also has an intramedullary nail in her femoral shaft.   Patient returns today for follow-up in regards to her medications.  She states that she has been utilizing Percocet usually 3 times a day and feels that she needs a third dose in the evening.  Her last prescription was for 75 tablets.  I will increase the patient's total monthly allotment to 90 and have notified her that no further dose escalation beyond this.  Patient's final chronic opioid regimen will be Percocet 10 mg 3 times daily as needed, quantity 56-monthwhich equals MME 45.  I will also refill the patient's Robaxin today as she can take as needed for muscle spasms.  Also repeat urine drug screen today.  Plan: -Repeat UDS today.  Should be positive for hydrocodone and its metabolites. -Continue ibuprofen 800 mg as needed -Continue Cymbalta as prescribed by psychiatry -Prescription for Percocet 10 mg 3 times daily as needed, quantity 927-month Prescription provided for 2 months   Plan of Care  Pharmacotherapy (Medications  Ordered): Meds ordered this encounter  Medications  . methocarbamol (ROBAXIN) 500 MG tablet    Sig: Take 1 tablet (500 mg total) by mouth 3 (three) times daily as needed for muscle spasms.    Dispense:  90 tablet    Refill:  3  . DISCONTD: oxyCODONE-acetaminophen (PERCOCET) 10-325 MG tablet    Sig: Take 1 tablet by mouth 3 (three) times daily as needed for pain.    Dispense:  90 tablet    Refill:  0    For chronic pain To last  for 30 days from fill date To fill on or after: 07/15/17, 08/13/17  Do not place this medication, or any other prescription from our practice, on "Automatic Refill". Patient may have prescription filled one day early if pharmacy is closed on scheduled refill date.  Marland Kitchen oxyCODONE-acetaminophen (PERCOCET) 10-325 MG tablet    Sig: Take 1 tablet by mouth 3 (three) times daily as needed for pain.    Dispense:  90 tablet    Refill:  0    For chronic pain To last for 30 days from fill date To fill on or after: 07/15/17, 08/13/17  Do not place this medication, or any other prescription from our practice, on "Automatic Refill". Patient may have prescription filled one day early if pharmacy is closed on scheduled refill date.   Lab-work, procedure(s), and/or referral(s): Orders Placed This Encounter  Procedures  . ToxASSURE Select 13 (MW), Urine   Time Note: Greater than 50% of the 25 minute(s) of face-to-face time spent with Lauren Greene, was spent in counseling/coordination of care regarding: Lauren Greene primary cause of pain, the treatment plan, medication side effects, the opioid analgesic risks and possible complications, the appropriate use of her medications, realistic expectations, the need to bring and keep the BMI below 30, the medication agreement and the patient's responsibilities when it comes to controlled substances. Provider-requested follow-up: Return in about 8 weeks (around 09/09/2017) for Medication Management.  Future Appointments  Date Time Provider  [place]  09/03/2017  1:30 PM Gillis Santa, MD [facility] None  10/10/2017  2:30 PM Kathyrn Drown, MD [facility]  06/23/2018  2:00 PM Megan Salon, MD [facility] None    Primary Care Physician: Kathyrn Drown, MD Location: [facility] Note by: Gillis Santa, M.D Date: 07/15/2017; Time: 2:22 PM  There are no Patient Instructions on file for this visit.

## 2017-07-15 NOTE — Progress Notes (Signed)
Nursing Pain Medication Assessment:  Safety precautions to be maintained throughout the outpatient stay will include: orient to surroundings, keep bed in low position, maintain call bell within reach at all times, provide assistance with transfer out of bed and ambulation.  Medication Inspection Compliance: Pill count conducted under aseptic conditions, in front of the patient. Neither the pills nor the bottle was removed from the patient's sight at any time. Once count was completed pills were immediately returned to the patient in their original bottle.  Medication: Oxycodone/APAP Pill/Patch Count: 0 of 75 pills remain Pill/Patch Appearance: Markings consistent with prescribed medication Bottle Appearance: Standard pharmacy container. Clearly labeled. Filled Date: 04 / 02 / 2019   Last Medication intake:  07/11/2017  Safety precautions to be maintained throughout the outpatient stay will include: orient to surroundings, keep bed in low position, maintain call bell within reach at all times, provide assistance with transfer out of bed and ambulation.

## 2017-07-19 ENCOUNTER — Other Ambulatory Visit: Payer: Self-pay | Admitting: Family Medicine

## 2017-07-19 LAB — TOXASSURE SELECT 13 (MW), URINE

## 2017-07-30 NOTE — Progress Notes (Signed)
[facility] MD/PA/NP OP Progress Note  08/04/2017 3:07 PM Lauren Greene  MRN:  [id number]  Chief Complaint:  Chief Complaint    Follow-up; Depression; Anxiety     HPI:  Patient presents for follow-up appointment for depression.  She states that she noticed she has been more irritable lately.  She denies any violence to other people, but is "quick to react." She was also told by her sister that she is such an "anal." She has certain order to follow when she put dishes in a dishwasher. She denies counting or other repetitive behavior. She is very organized and she wonders if it may be related to her fear as her oldest sister is a Chartered loss adjuster. 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 She was reinitiated on percocet; although she feels grateful that she is back on medication, she does not think it is enough as she used to take higher dose. She has insomnia.. She feels fatigue at times. She denies significant depression. She denies SI. She denies anxiety. She denies panic attacks. She apologizes for last minute cancellations; she agrees to notify the office before 24 hours.   Per PMP,  On adderall, oxycodone. Valium last filled on 12/04/2016    Visit Diagnosis:    ICD-10-CM   1. Moderate episode of recurrent major depressive disorder (HCC) F33.1     Past Psychiatric History:  I have reviewed the patient's psychiatry history in detail and updated the patient record. Outpatient: She was evaluated for ADD in [facility] in 2014; started on Adderall since then. Psychiatry admission: denies Previous suicide attempt: denies Past trials of medication: Celexa, Wellbutrin ("mean"), Prozac, Effexor, Mirtazapine, duloxetine, nortriptyline (dry mouth), Adderall (4 years), gabapentin, Lyrica (swelling), lorazepam, valium  Past Medical History:  Past Medical History:  Diagnosis Date  . ADHD (attention deficit  hyperactivity disorder)   . Arthritis   . Back pain   . Broken femur (HCC) 08/27/12  . Migraine   . Osteoarthritis     Past Surgical History:  Procedure Laterality Date  . BACK SURGERY    . CERVICAL BIOPSY  W/ LOOP ELECTRODE EXCISION  1990's   in [place], [place]  . ENDOMETRIAL BIOPSY  04-05-05 and 02-12-06   04-05-05 benign but too rapid growth. RXd Provera and repeat Bx 65mo. 02-12-06 Bx benign--Dr. Leda Quail  . FEMUR IM NAIL Left 08/27/2012   Procedure: INTRAMEDULLARY (IM) RETROGRADE FEMORAL NAILING;  Surgeon: Kathryne Hitch, MD;  Location: [facility] OR;  Service: Orthopedics;  Laterality: Left;  . HEEL SPUR SURGERY    . IM NAILING FEMORAL SHAFT FRACTURE Left 08/27/2012   Dr Magnus Ivan  . SHOULDER SURGERY Left   . TOTAL KNEE ARTHROPLASTY     x2  . TUBAL LIGATION    . VAGINAL HYSTERECTOMY      Family Psychiatric History:  I have reviewed the patient's family history in detail and updated the patient record. Family History:  Family History  Problem Relation Age of Onset  . Cancer Mother   . Breast cancer Mother   . Bipolar disorder Mother   . Heart failure Father   . Cancer Father   . Cancer Other   . Heart failure Other   . Bipolar disorder Sister     Social History:  Social History   Socioeconomic History  . Marital status: Single    Spouse name: Not on file  . Number of  children: Not on file  . Years of education: Not on file  . Highest education level: Not on file  Occupational History  . Not on file  Social Needs  . Financial resource strain: Not on file  . Food insecurity:    Worry: Not on file    Inability: Not on file  . Transportation needs:    Medical: Not on file    Non-medical: Not on file  Tobacco Use  . Smoking status: Former Smoker    Years: 10.00    Types: Cigarettes    Last attempt to quit: 08/16/2000    Years since quitting: 16.9  . Smokeless tobacco: Never Used  Substance and Sexual Activity  . Alcohol use: Yes    Alcohol/week: 0.0 oz     Comment: rarely  . Drug use: No  . Sexual activity: Not Currently    Partners: Female  Lifestyle  . Physical activity:    Days per week: Not on file    Minutes per session: Not on file  . Stress: Not on file  Relationships  . Social connections:    Talks on phone: Not on file    Gets together: Not on file    Attends religious service: Not on file    Active member of club or organization: Not on file    Attends meetings of clubs or organizations: Not on file    Relationship status: Not on file  Other Topics Concern  . Not on file  Social History Narrative  . Not on file    Allergies:  Allergies  Allergen Reactions  . Wellbutrin [Bupropion]     Anger issues  . Lyrica [Pregabalin] Swelling    Leg swelling    Metabolic Disorder Labs: Lab Results  Component Value Date   HGBA1C 5.6 07/22/2014   MPG 103 02/16/2013   MPG 111 07/24/2010   No results found for: PROLACTIN Lab Results  Component Value Date   CHOL 195 03/15/2017   TRIG 119 03/15/2017   HDL 55 03/15/2017   CHOLHDL 3.5 03/15/2017   VLDL 13 07/10/2012   LDLCALC 116 (H) 03/15/2017   LDLCALC 108 (H) 01/12/2016   Lab Results  Component Value Date   TSH 4.104 06/24/2013   TSH 4.142 01/07/2013    Therapeutic Level Labs: No results found for: LITHIUM No results found for: VALPROATE No components found for:  CBMZ  Current Medications: Current Outpatient Medications  Medication Sig Dispense Refill  . amphetamine-dextroamphetamine (ADDERALL XR) 30 MG 24 hr capsule 2 qam 60 capsule 0  . clobetasol ointment (TEMOVATE) 0.05 %     . Diclofenac Sodium CR 100 MG 24 hr tablet TAKE 1 TABLET BY MOUTH TWICE DAILY. TAKE WITH FOOD. 60 tablet 5  . DULoxetine (CYMBALTA) 60 MG capsule Take 2 capsules (120 mg total) by mouth daily. 180 capsule 0  . methocarbamol (ROBAXIN) 500 MG tablet Take 1 tablet (500 mg total) by mouth 3 (three) times daily as needed for muscle spasms. 90 tablet 3  . omeprazole (PRILOSEC) 40 MG  capsule TAKE (1) CAPSULE BY MOUTH ONCE DAILY. 30 capsule 12  . oxyCODONE-acetaminophen (PERCOCET) 10-325 MG tablet Take 1 tablet by mouth 3 (three) times daily as needed for pain. 90 tablet 0  . traZODone (DESYREL) 100 MG tablet Take 100 mg by mouth at bedtime. 2 tablets at bedtimr    . traZODone (DESYREL) 100 MG tablet 100-200 mg at night as needed for sleep 180 tablet 0   No current facility-administered  medications for this visit.      Musculoskeletal: Strength & Muscle Tone: within normal limits Gait & Station: normal Patient leans: N/A  Psychiatric Specialty Exam: Review of Systems  Musculoskeletal: Positive for back pain.  Psychiatric/Behavioral: Positive for depression. Negative for hallucinations, memory loss, substance abuse and suicidal ideas. The patient is nervous/anxious and has insomnia.   All other systems reviewed and are negative.   Blood pressure (!) 152/76, pulse 80, height  (1.727 m), weight 245 lb (111.1 kg), last menstrual period 03/18/2004, SpO2 98 %.Body mass index is 37.25 kg/m.  General Appearance: Fairly Groomed  Eye Contact:  Good  Speech:  Clear and Coherent  Volume:  Normal  Mood:  Irritable  Affect:  Appropriate, Congruent and reactive  Thought Process:  Coherent  Orientation:  Full (Time, Place, and Person)  Thought Content: Logical   Suicidal Thoughts:  No  Homicidal Thoughts:  No  Memory:  Immediate;   Good  Judgement:  Good  Insight:  Fair  Psychomotor Activity:  Normal  Concentration:  Concentration: Good and Attention Span: Good  Recall:  Good  Fund of Knowledge: Good  Language: Good  Akathisia:  No  Handed:  Right  AIMS (if indicated): not done  Assets:  Communication Skills Desire for Improvement  ADL's:  Intact  Cognition: WNL  Sleep:  Poor   Screenings: PHQ2-9     Clinical Support from 07/15/2017 in [facility] Clinical Support from 06/17/2017 in [facility] Office Visit from 05/20/2017 in [facility] Office Visit from 05[facility]08/2017 in [facility] Office Visit from 04[facility]16/2017 in [facility]  PHQ-2 Total Score  0  0  0  0  0       Assessment and Plan:  SHAWNNA PANCAKE is a 52 y.o. year old female with a history of depression, ADHD per chart, anxiety, hyperhydrosis, chronic pain , who presents for follow up appointment for Moderate episode of recurrent major depressive disorder (HCC)  # MDD, moderate, recurrent without psychotic features Patient reports overall improvement in neurovegetative symptoms since last appointment. Will continue duloxetine to target neurovegetative symptoms and pain.  Discussed risk of hypertension. (She does have hypertension- will continue to monitor). Although her irritability is most likely due to pain, will continue to monitor and will consider adjunctive treatment as indicated. Will continue trazodone prn for insomnia. Will continue valium prn for anxiety. Discussed risk of dependence. Discussed behavioral activation.   # ADHD She was diagnosed with ADHD per PCP and has been on Adderall.   Plan 1  Continue duloxetine 120 mg daily  2. Continue Trazodone 100 - 200 mg at night as needed for sleep 3. Continue valium 2 mg daily as needed for anxiety (she declines refill at this time) 4.Return to clinic in three monthsfor 30 mins (Patient is on Adderall XR 30 mg daily prescribed by her PCP)  The patient demonstrates the following risk factors for suicide: Chronic risk factors for suicide include: psychiatric disorder of depressionand chronic pain. Acute risk factorsfor suicide include: loss (financial, interpersonal, professional). Protective factorsfor this patient include: responsibility to others (children, family), coping skills and hope for the future. Considering  these factors, the overall suicide risk at this point appears to be low. Patient isappropriate for outpatient follow up.  The duration of this appointment visit was 30 minutes of face-to-face time with  the patient.  Greater than 50% of this time was spent in counseling, explanation of  diagnosis, planning of further management, and coordination of care.  Neysa Hotter, MD 08/04/2017, 3:07 PM

## 2017-08-04 ENCOUNTER — Encounter (HOSPITAL_COMMUNITY): Payer: Self-pay | Admitting: Psychiatry

## 2017-08-04 ENCOUNTER — Ambulatory Visit (INDEPENDENT_AMBULATORY_CARE_PROVIDER_SITE_OTHER): Payer: PPO | Admitting: Psychiatry

## 2017-08-04 VITALS — BP 152/76 | HR 80 | Ht 68.0 in | Wt 245.0 lb

## 2017-08-04 DIAGNOSIS — F419 Anxiety disorder, unspecified: Secondary | ICD-10-CM | POA: Diagnosis not present

## 2017-08-04 DIAGNOSIS — Z818 Family history of other mental and behavioral disorders: Secondary | ICD-10-CM | POA: Diagnosis not present

## 2017-08-04 DIAGNOSIS — F909 Attention-deficit hyperactivity disorder, unspecified type: Secondary | ICD-10-CM

## 2017-08-04 DIAGNOSIS — R450 Nervousness: Secondary | ICD-10-CM | POA: Diagnosis not present

## 2017-08-04 DIAGNOSIS — G4700 Insomnia, unspecified: Secondary | ICD-10-CM

## 2017-08-04 DIAGNOSIS — F331 Major depressive disorder, recurrent, moderate: Secondary | ICD-10-CM

## 2017-08-04 DIAGNOSIS — M549 Dorsalgia, unspecified: Secondary | ICD-10-CM

## 2017-08-04 DIAGNOSIS — Z87891 Personal history of nicotine dependence: Secondary | ICD-10-CM | POA: Diagnosis not present

## 2017-08-04 MED ORDER — TRAZODONE HCL 100 MG PO TABS: ORAL_TABLET | ORAL | 0 refills | Status: DC

## 2017-08-04 MED ORDER — DULOXETINE HCL 60 MG PO CPEP: 120.0000 mg | ORAL_CAPSULE | Freq: Every day | ORAL | 0 refills | Status: DC

## 2017-08-04 NOTE — Patient Instructions (Addendum)
1  Continue duloxetine 120 mg daily  2. Continue Trazodone 100 - 200 mg at night as needed for sleep 3. Continue valium 2 mg daily as needed for anxiety 4.Return to clinic in threemonthsfor 30 mins 

## 2017-08-14 ENCOUNTER — Other Ambulatory Visit: Payer: Self-pay | Admitting: Family Medicine

## 2017-09-03 ENCOUNTER — Ambulatory Visit
Payer: PPO | Attending: Student in an Organized Health Care Education/Training Program | Admitting: Student in an Organized Health Care Education/Training Program

## 2017-09-03 ENCOUNTER — Encounter: Payer: Self-pay | Admitting: Student in an Organized Health Care Education/Training Program

## 2017-09-03 ENCOUNTER — Other Ambulatory Visit: Payer: Self-pay

## 2017-09-03 VITALS — BP 156/97 | HR 75 | Temp 97.9°F | Resp 16 | Ht 68.0 in | Wt 240.0 lb

## 2017-09-03 DIAGNOSIS — F338 Other recurrent depressive disorders: Secondary | ICD-10-CM | POA: Diagnosis not present

## 2017-09-03 DIAGNOSIS — M5136 Other intervertebral disc degeneration, lumbar region: Secondary | ICD-10-CM | POA: Insufficient documentation

## 2017-09-03 DIAGNOSIS — L74513 Primary focal hyperhidrosis, soles: Secondary | ICD-10-CM | POA: Diagnosis not present

## 2017-09-03 DIAGNOSIS — Z79899 Other long term (current) drug therapy: Secondary | ICD-10-CM | POA: Insufficient documentation

## 2017-09-03 DIAGNOSIS — F988 Other specified behavioral and emotional disorders with onset usually occurring in childhood and adolescence: Secondary | ICD-10-CM | POA: Insufficient documentation

## 2017-09-03 DIAGNOSIS — M21611 Bunion of right foot: Secondary | ICD-10-CM | POA: Insufficient documentation

## 2017-09-03 DIAGNOSIS — M47816 Spondylosis without myelopathy or radiculopathy, lumbar region: Secondary | ICD-10-CM | POA: Diagnosis not present

## 2017-09-03 DIAGNOSIS — M797 Fibromyalgia: Secondary | ICD-10-CM

## 2017-09-03 DIAGNOSIS — M479 Spondylosis, unspecified: Secondary | ICD-10-CM | POA: Diagnosis not present

## 2017-09-03 DIAGNOSIS — Z79891 Long term (current) use of opiate analgesic: Secondary | ICD-10-CM | POA: Diagnosis not present

## 2017-09-03 DIAGNOSIS — R030 Elevated blood-pressure reading, without diagnosis of hypertension: Secondary | ICD-10-CM | POA: Insufficient documentation

## 2017-09-03 DIAGNOSIS — M9712XA Periprosthetic fracture around internal prosthetic left knee joint, initial encounter: Secondary | ICD-10-CM | POA: Diagnosis not present

## 2017-09-03 DIAGNOSIS — M1990 Unspecified osteoarthritis, unspecified site: Secondary | ICD-10-CM | POA: Diagnosis not present

## 2017-09-03 DIAGNOSIS — M25569 Pain in unspecified knee: Secondary | ICD-10-CM | POA: Insufficient documentation

## 2017-09-03 DIAGNOSIS — M48061 Spinal stenosis, lumbar region without neurogenic claudication: Secondary | ICD-10-CM | POA: Insufficient documentation

## 2017-09-03 DIAGNOSIS — Z6836 Body mass index (BMI) 36.0-36.9, adult: Secondary | ICD-10-CM | POA: Diagnosis not present

## 2017-09-03 DIAGNOSIS — M25559 Pain in unspecified hip: Secondary | ICD-10-CM | POA: Diagnosis not present

## 2017-09-03 DIAGNOSIS — K219 Gastro-esophageal reflux disease without esophagitis: Secondary | ICD-10-CM | POA: Insufficient documentation

## 2017-09-03 DIAGNOSIS — M549 Dorsalgia, unspecified: Secondary | ICD-10-CM | POA: Insufficient documentation

## 2017-09-03 DIAGNOSIS — G894 Chronic pain syndrome: Secondary | ICD-10-CM | POA: Diagnosis not present

## 2017-09-03 DIAGNOSIS — M51369 Other intervertebral disc degeneration, lumbar region without mention of lumbar back pain or lower extremity pain: Secondary | ICD-10-CM

## 2017-09-03 DIAGNOSIS — M609 Myositis, unspecified: Secondary | ICD-10-CM | POA: Insufficient documentation

## 2017-09-03 MED ORDER — METHOCARBAMOL 750 MG PO TABS: 750.0000 mg | ORAL_TABLET | Freq: Three times a day (TID) | ORAL | 0 refills | Status: DC | PRN

## 2017-09-03 MED ORDER — OXYCODONE-ACETAMINOPHEN 10-325 MG PO TABS: 1.0000 | ORAL_TABLET | Freq: Three times a day (TID) | ORAL | 0 refills | Status: DC | PRN

## 2017-09-03 NOTE — Progress Notes (Signed)
Patient's Name: Lauren Greene  MRN: [id number]  Referring Provider: Kathyrn Drown, MD  DOB: [birth year]  PCP: Kathyrn Drown, MD  DOS: 09/03/2017  Note by: Gillis Santa, MD  Service setting: Ambulatory outpatient  Specialty: Interventional Pain Management  Location: [facility] (AMB) Pain Management Facility    Patient type: Established   Primary Reason(s) for Visit: Encounter for prescription drug management. (Level of risk: moderate)  CC: Back Pain (lower)  HPI  Ms. Vandoren is a 52 y.o. year old, female patient, who comes today for a medication management evaluation. She has ADD (attention deficit disorder); Periprosthetic fracture around internal prosthetic left knee joint; Depression, major, in remission ([place]); Chronic arthralgias of knees and hips; Chronic back pain; Arthritis; Myalgia and myositis; Elevated blood pressure; Morbid obesity ([place]); Osteoarthritis; Hyperhydrosis disorder; Moderate episode of recurrent major depressive disorder ([place]); Bunion, right; Lumbar degenerative disc disease; GERD (gastroesophageal reflux disease); Chronic, continuous use of opioids; and Fibromyalgia on their problem list. Her primarily concern today is the Back Pain (lower)  Pain Assessment: Location: Lower, Right, Left Back Radiating: Denies Onset: More than a month ago Duration: Chronic pain Quality: Burning, Constant Severity: 7 /10 (subjective, self-reported pain score)  Note: Reported level is inconsistent with clinical observations.                         When using our objective Pain Scale, levels between 6 and 10/10 are said to belong in an emergency room, as it progressively worsens from a 6/10, described as severely limiting, requiring emergency care not usually available at an outpatient pain management facility. At a 6/10 level, communication becomes difficult and requires great effort. Assistance to reach the emergency department may be required. Facial flushing and profuse sweating along with  potentially dangerous increases in heart rate and blood pressure will be evident. Effect on ADL: bending, Lifting ,picking leg up Timing: Constant Modifying factors: medications BP: (!) 156/97  HR: 75  Ms. Culliton was last scheduled for an appointment on 07/15/2017 for medication management. During today's appointment we reviewed Ms. Albritton's chronic pain status, as well as her outpatient medication regimen.  Patient returns for follow-up.  She states that she has been having increased pain over the last 2 weeks since she has been out of her medications.  Upon further discussion with the patient,It was discovered that patient forgot that she had a second prescription waiting at her pharmacy to be filled.  Patient called the pharmacy while he was in the room to confirm that prescription was ready.  Patient was requesting to increase her Percocet to 4 times a day which we will NOT do and continue at 3 times a day as needed no further dose escalation beyond Percocet 10 mg 3 times daily as needed.  She is requesting to increase her Robaxin from 500 mg to 750 mg which is reasonable.  Will increase to 750 mg 3 times daily as needed.  Patient's last urine drug screen did not show any oxycodone.  Patient states that she ran out of oxycodone prior to her clinic visit on 07/15/2017 for that reason did not have any in her system.  The patient has not taken any oxycodone over the last 2 weeks given that she forgot she had a second prescription available.  I will repeat urine drug screen today.  This  should be negative for oxycodone in any of its metabolites.  The patient  reports that she does not use drugs.  Her body mass index is 36.49 kg/m.  Further details on both, my assessment(s), as well as the proposed treatment plan, please see below.  Controlled Substance Pharmacotherapy Assessment REMS (Risk Evaluation and Mitigation Strategy)  Analgesic: Percocet 10 mg TID prn, #90/month MME/day: 45 mg/day.   Ignatius Specking, RN  09/03/2017  1:54 PM  Sign at close encounter Nursing Pain Medication Assessment:  Safety precautions to be maintained throughout the outpatient stay will include: orient to surroundings, keep bed in low position, maintain call bell within reach at all times, provide assistance with transfer out of bed and ambulation.  Medication Inspection Compliance: Ms. Niebla did not comply with our request to bring her pills to be counted. She was reminded that bringing the medication bottles, even when empty, is a requirement.  Medication: None brought in. Pill/Patch Count: None available to be counted. Bottle Appearance: No container available. Did not bring bottle(s) to appointment. Filled Date: N/A Last Medication intake:  08/15/17   Pharmacokinetics: Liberation and absorption (onset of action): WNL Distribution (time to peak effect): WNL Metabolism and excretion (duration of action): WNL         Pharmacodynamics: Desired effects: Analgesia: Ms. Wiens reports >50% benefit. Functional ability: Patient reports that medication allows her to accomplish basic ADLs Clinically meaningful improvement in function (CMIF): Sustained CMIF goals met Perceived effectiveness: Described as relatively effective, allowing for increase in activities of daily living (ADL) Undesirable effects: Side-effects or Adverse reactions: None reported Monitoring: [place] PMP: Online review of the past 25-monthperiod conducted. Compliant with practice rules and regulations Last UDS on record: Summary  Date Value Ref Range Status  07/15/2017 FINAL  Final    Comment:    ==================================================================== TOXASSURE SELECT 13 (MW) ==================================================================== Test                             Result       Flag       Units Drug Present and Declared for Prescription Verification   Amphetamine                    >3448        EXPECTED    ng/mg creat    Amphetamine is available as a schedule II prescription drug. Drug Absent but Declared for Prescription Verification   Oxycodone                      Not Detected UNEXPECTED ng/mg creat ==================================================================== Test                      Result    Flag   Units      Ref Range   Creatinine              290              mg/dL      >=20 ==================================================================== Declared Medications:  The flagging and interpretation on this report are based on the  following declared medications.  Unexpected results may arise from  inaccuracies in the declared medications.  **Note: The testing scope of this panel includes these medications:  Amphetamine (Adderall XR)  Oxycodone (Percocet)  **Note: The testing scope of this panel does not include following  reported medications:  Acetaminophen (Percocet)  Clobetasol (Temovate)  Diclofenac  Duloxetine (Cymbalta)  Methocarbamol (Robaxin)  Omeprazole (Prilosec)  Trazodone ==================================================================== For clinical consultation, please call [phone] ====================================================================  UDS interpretation: Compliant          Medication Assessment Form: Reviewed. Patient indicates being compliant with therapy Treatment compliance: Compliant Risk Assessment Profile: Aberrant behavior: See prior evaluations. None observed or detected today Comorbid factors increasing risk of overdose: See prior notes. No additional risks detected today Risk of substance use disorder (SUD): Low Opioid Risk Tool - 09/03/17 1346      Personal History of Substance Abuse   Alcohol  Negative    Illegal Drugs  Negative    Rx Drugs  Negative      Psychological Disease   Psychological Disease  Positive    ADD  Positive ADHD   ADHD   OCD  Negative    Bipolar  Negative    Schizophrenia  Negative     Depression  Negative      Total Score   Opioid Risk Tool Scoring  2    Opioid Risk Interpretation  Low Risk      ORT Scoring interpretation table:  Score <3 = Low Risk for SUD  Score between 4-7 = Moderate Risk for SUD  Score >8 = High Risk for Opioid Abuse   Risk Mitigation Strategies:  Patient Counseling: Covered Patient-Prescriber Agreement (PPA): Present and active  Notification to other healthcare providers: Done  Pharmacologic Plan: No change in therapy, at this time.             Laboratory Chemistry  Inflammation Markers (CRP: Acute Phase) (ESR: Chronic Phase) Lab Results  Component Value Date   CRP 1.7 08/03/2014   ESRSEDRATE 2 08/03/2014                         Rheumatology Markers Lab Results  Component Value Date   RF 12 06/24/2013   ANA Negative 08/03/2014                        Renal Function Markers Lab Results  Component Value Date   BUN 13 04/15/2017   CREATININE 0.58 04/15/2017   BCR 22 04/15/2017   GFRAA 123 04/15/2017   GFRNONAA 106 04/15/2017                             Hepatic Function Markers Lab Results  Component Value Date   AST 26 03/15/2017   ALT 26 03/15/2017   ALBUMIN 4.3 03/15/2017   ALKPHOS 117 03/15/2017   LIPASE 25 01/12/2016                        Electrolytes Lab Results  Component Value Date   NA 142 04/15/2017   K 4.6 04/15/2017   CL 102 04/15/2017   CALCIUM 9.5 04/15/2017                        Neuropathy Markers Lab Results  Component Value Date   HGBA1C 5.6 07/22/2014                        Bone Pathology Markers Lab Results  Component Value Date   VD25OH 31 01/07/2013                         Coagulation Parameters Lab Results  Component Value Date   INR 1.04 02/16/2013   LABPROT 13.6 02/16/2013   APTT  29 02/16/2013   PLT 274 01/12/2016                        Cardiovascular Markers Lab Results  Component Value Date   CKTOTAL 139 08/03/2014   CKMB 2.8 06/24/2013   HGB 14.6 01/12/2016    HCT 43.4 01/12/2016                         CA Markers No results found for: CEA, CA125, LABCA2                      Note: Lab results reviewed.  Recent Diagnostic Imaging Results  MR Lumbar Spine W Wo Contrast CLINICAL DATA:  Back pain with right lower extremity pain. Prior lumbar surgery.  EXAM: MRI LUMBAR SPINE WITHOUT AND WITH CONTRAST  TECHNIQUE: Multiplanar and multiecho pulse sequences of the lumbar spine were obtained without and with intravenous contrast.  CONTRAST:  54m MULTIHANCE GADOBENATE DIMEGLUMINE 529 MG/ML IV SOLN  COMPARISON:  Lumbar spine MRI 02/17/2014  FINDINGS: Segmentation: Normal. The lowest disc space is considered to be L5-S1.  Alignment:  Grade 1 retrolisthesis at L1-2 and L2-3, unchanged.  Vertebrae: No acute compression fracture, facet edema or focal marrow lesion.  Conus medullaris: Extends to the L1 level and appears normal.  Paraspinal and other soft tissues: The visualized aorta, IVC and iliac vessels are normal. The visualized retroperitoneal organs and paraspinal soft tissues are normal.  Disc levels:  T10-T11: Evaluated on sagittal images only. Small disc herniation without spinal canal or neural foraminal stenosis.  T11-T12: Evaluated on sagittal images only. Small disc herniation. No spinal canal or neural foraminal stenosis.  T12-L1: Normal disc space and facet joints. No spinal canal stenosis. No neuroforaminal stenosis.  L1-L2: Narrowing of the intervertebral disc space with disc edema. Mild facet hypertrophy. Small disc osteophyte complex. No spinal canal stenosis. Moderate bilateral, right worse than left, neuroforaminal stenosis. This is unchanged.  L2-L3: Diffuse disc bulge with mild narrowing of the lateral recesses. Mild unchanged spinal canal stenosis. Moderate bilateral unchanged neuroforaminal stenosis.  L3-L4: Diffuse disc bulge with superimposed central protrusion, unchanged. Bilateral mild facet  hypertrophy. Unchanged moderate spinal canal stenosis. Moderate bilateral neural foraminal stenosis. This level is unchanged.  L4-L5: Diffuse disc bulge with narrowing of the left-greater-than-right lateral recesses. No spinal canal stenosis. Mild right and moderate left neuroforaminal stenosis. This level is unchanged.  L5-S1: Left eccentric disc bulge and moderate bilateral facet hypertrophy. No spinal canal stenosis. Moderate left neuroforaminal stenosis, unchanged.  IMPRESSION: 1. Worsened disc edema at L1-L2 with unchanged neuroforaminal stenosis, right greater than left. 2. Unchanged moderate spinal canal stenosis at L3-L4. There is also moderate bilateral neural foraminal narrowing at this level, also unchanged. 3. Unchanged left eccentric disc bulge at L5-S1 narrowing the left neural foramen.  Electronically Signed   By: KUlyses JarredM.D.   On: 12/01/2015 02:19  Complexity Note: Imaging results reviewed. Results shared with Ms. SConley Canal using Layman's terms.                         Meds   Current Outpatient Medications:  .  amphetamine-dextroamphetamine (ADDERALL XR) 30 MG 24 hr capsule, 2 qam, Disp: 60 capsule, Rfl: 0 .  clobetasol ointment (TEMOVATE) 0.05 %, , Disp: , Rfl:  .  Diclofenac Sodium CR 100 MG 24 hr tablet, TAKE 1 TABLET BY MOUTH TWICE DAILY. TAKE WITH  FOOD., Disp: 60 tablet, Rfl: 5 .  DULoxetine (CYMBALTA) 60 MG capsule, Take 2 capsules (120 mg total) by mouth daily., Disp: 180 capsule, Rfl: 0 .  nystatin (MYCOSTATIN/NYSTOP) powder, APPLY TO AFFECTED AREA(S) TOPICALLY TWICE DAILY., Disp: 45 g, Rfl: 6 .  omeprazole (PRILOSEC) 40 MG capsule, TAKE (1) CAPSULE BY MOUTH ONCE DAILY., Disp: 30 capsule, Rfl: 12 .  [START ON 10/02/2017] oxyCODONE-acetaminophen (PERCOCET) 10-325 MG tablet, Take 1 tablet by mouth 3 (three) times daily as needed for pain., Disp: 90 tablet, Rfl: 0 .  traZODone (DESYREL) 100 MG tablet, Take 100 mg by mouth at bedtime. 2 tablets at  bedtimr, Disp: , Rfl:  .  methocarbamol (ROBAXIN) 750 MG tablet, Take 1 tablet (750 mg total) by mouth every 8 (eight) hours as needed for muscle spasms., Disp: 90 tablet, Rfl: 0  ROS  Constitutional: Denies any fever or chills Gastrointestinal: No reported hemesis, hematochezia, vomiting, or acute GI distress Musculoskeletal: Denies any acute onset joint swelling, redness, loss of ROM, or weakness Neurological: No reported episodes of acute onset apraxia, aphasia, dysarthria, agnosia, amnesia, paralysis, loss of coordination, or loss of consciousness  Allergies  Ms. Paulding is allergic to wellbutrin [bupropion] and lyrica [pregabalin].  [place]  Drug: Ms. Cimini  reports that she does not use drugs. Alcohol:  reports that she drinks alcohol. Tobacco:  reports that she quit smoking about 17 years ago. Her smoking use included cigarettes. She quit after 10.00 years of use. She has never used smokeless tobacco. Medical:  has a past medical history of ADHD (attention deficit hyperactivity disorder), Arthritis, Back pain, Broken femur ([place]) (08/27/12), Migraine, and Osteoarthritis. Surgical: Ms. Oconnor  has a past surgical history that includes Total knee arthroplasty; Heel spur surgery; Back surgery; IM nailing femoral shaft fracture (Left, 08/27/2012); Femur IM nail (Left, 08/27/2012); Cervical biopsy w/ loop electrode excision (1990's); Endometrial biopsy (04-05-05 and 02-12-06); Tubal ligation; Shoulder surgery (Left); and Vaginal hysterectomy. Family: family history includes Bipolar disorder in her mother and sister; Breast cancer in her mother; Cancer in her father, mother, and other; Heart failure in her father and other.  Constitutional Exam  General appearance: Well nourished, well developed, and well hydrated. In no apparent acute distress Vitals:   09/03/17 1337  BP: (!) 156/97  Pulse: 75  Resp: 16  Temp: 97.9 F (36.6 C)  SpO2: 100%  Weight: 240 lb (108.9 kg)  Height: '5\' 8"'$   (1.727 m)   BMI Assessment: Estimated body mass index is 36.49 kg/m as calculated from the following:   Height as of this encounter: '5\' 8"'$  (1.727 m).   Weight as of this encounter: 240 lb (108.9 kg).  BMI interpretation table: BMI level Category Range association with higher incidence of chronic pain  <18 kg/m2 Underweight   18.5-24.9 kg/m2 Ideal body weight   25-29.9 kg/m2 Overweight Increased incidence by 20%  30-34.9 kg/m2 Obese (Class I) Increased incidence by 68%  35-39.9 kg/m2 Severe obesity (Class II) Increased incidence by 136%  >40 kg/m2 Extreme obesity (Class III) Increased incidence by 254%   Patient's current BMI Ideal Body weight  Body mass index is 36.49 kg/m. Ideal body weight: 63.9 kg (140 lb 14 oz) Adjusted ideal body weight: 81.9 kg (180 lb 8.4 oz)   BMI Readings from Last 4 Encounters:  09/03/17 36.49 kg/m  08/04/17 37.25 kg/m  07/15/17 36.49 kg/m  07/11/17 39.48 kg/m   Wt Readings from Last 4 Encounters:  09/03/17 240 lb (108.9 kg)  08/04/17 245 lb (111.1 kg)  07/15/17 240 lb (108.9 kg)  07/11/17 244 lb 9.6 oz (110.9 kg)  Psych/Mental status: Alert, oriented x 3 (person, place, & time)       Eyes: PERLA Respiratory: No evidence of acute respiratory distress  Cervical Spine Area Exam  Skin & Axial Inspection: No masses, redness, edema, swelling, or associated skin lesions Alignment: Symmetrical Functional ROM: Unrestricted ROM      Stability: No instability detected Muscle Tone/Strength: Functionally intact. No obvious neuro-muscular anomalies detected. Sensory (Neurological): Unimpaired Palpation: No palpable anomalies              Upper Extremity (UE) Exam    Side: Right upper extremity  Side: Left upper extremity  Skin & Extremity Inspection: Skin color, temperature, and hair growth are WNL. No peripheral edema or cyanosis. No masses, redness, swelling, asymmetry, or associated skin lesions. No contractures.  Skin & Extremity Inspection: Skin  color, temperature, and hair growth are WNL. No peripheral edema or cyanosis. No masses, redness, swelling, asymmetry, or associated skin lesions. No contractures.  Functional ROM: Unrestricted ROM          Functional ROM: Unrestricted ROM          Muscle Tone/Strength: Functionally intact. No obvious neuro-muscular anomalies detected.  Muscle Tone/Strength: Functionally intact. No obvious neuro-muscular anomalies detected.  Sensory (Neurological): Unimpaired          Sensory (Neurological): Unimpaired          Palpation: No palpable anomalies              Palpation: No palpable anomalies              Provocative Test(s):  Phalen's test: deferred Tinel's test: deferred Apley's scratch test (touch opposite shoulder):  Action 1 (Across chest): deferred Action 2 (Overhead): deferred Action 3 (LB reach): deferred   Provocative Test(s):  Phalen's test: deferred Tinel's test: deferred Apley's scratch test (touch opposite shoulder):  Action 1 (Across chest): deferred Action 2 (Overhead): deferred Action 3 (LB reach): deferred    Thoracic Spine Area Exam  Skin & Axial Inspection: No masses, redness, or swelling Alignment: Symmetrical Functional ROM: Unrestricted ROM Stability: No instability detected Muscle Tone/Strength: Functionally intact. No obvious neuro-muscular anomalies detected. Sensory (Neurological): Unimpaired Muscle strength & Tone: No palpable anomalies Lumbar Spine Area Exam  Skin & Axial Inspection:No masses, redness, or swelling Alignment:Symmetrical Functional PFX:[id number] ROM, bilaterally Stability:No instability detected Muscle Tone/Strength:Functionally intact. No obvious neuro-muscular anomalies detected. Sensory (Neurological):Articular pain pattern Palpation:No palpable anomalies Provocative Tests: Lumbar Hyperextension and rotation test:Positivebilaterally for facet joint pain. Lumbar Lateral bending test:Positiveipsilateral radicular pain,  bilaterally. Positive for bilateral foraminal stenosis. Patrick's Maneuver:Positivefor bilateral S-I arthralgia  Gait & Posture Assessment  Ambulation:Limited Gait:Antalgic Posture:Difficulty standing up straight, due to pain  Lower Extremity Exam    Side:Right lower extremity  Side:Left lower extremity  Skin & Extremity Inspection:Skin color, temperature, and hair growth are WNL. No peripheral edema or cyanosis. No masses, redness, swelling, asymmetry, or associated skin lesions. No contractures.  Skin & Extremity Inspection:Skin color, temperature, and hair growth are WNL. No peripheral edema or cyanosis. No masses, redness, swelling, asymmetry, or associated skin lesions. No contractures.  Functional HGD:[id number] ROM  Functional IWL:[id number] ROM  Muscle Tone/Strength:Functionally intact. No obvious neuro-muscular anomalies detected.  Muscle Tone/Strength:Functionally intact. No obvious neuro-muscular anomalies detected.  Sensory (Neurological):Unimpaired  Sensory (Neurological):Unimpaired  Palpation:No palpable anomalies  Palpation:No palpable anomalies    Assessment  Primary Diagnosis & Pertinent Problem List: The primary encounter diagnosis was Lumbar spondylosis. Diagnoses of  Lumbar degenerative disc disease, Lumbar facet arthropathy, Fibromyalgia, and Chronic pain syndrome were also pertinent to this visit.  Status Diagnosis  Persistent Persistent Persistent 1. Lumbar spondylosis   2. Lumbar degenerative disc disease   3. Lumbar facet arthropathy   4. Fibromyalgia   5. Chronic pain syndrome      General Recommendations: The pain condition that the patient suffers from is best treated with a multidisciplinary approach that involves an increase in physical activity to prevent de-conditioning and worsening of the pain cycle, as well as psychological counseling (formal and/or informal) to address the co-morbid  psychological affects of pain. Treatment will often involve judicious use of pain medications and interventional procedures to decrease the pain, allowing the patient to participate in the physical activity that will ultimately produce long-lasting pain reductions. The goal of the multidisciplinary approach is to return the patient to a higher level of overall function and to restore their ability to perform activities of daily living.  52 year old female with a history of morbid obesity, chronic back pain, bilateral knee pain secondary to bilateral total knee arthroplasty, back pain secondary to lumbar degenerative disc disease, multilevel lumbar disc herniations, facet arthropathy along with a diagnosis of fibromyalgia. Patient does have a history of a broken femur in June 2014. She is status post left shoulder surgery status post bilateral total knee arthroplasty. She also has an intramedullary nail in her femoral shaft.   Patient returns today for follow-up in regards to her medications. She states that she has been having increased pain over the last 2 weeks since she has been out of her medications.  Upon further discussion with the patient,It was discovered that patient forgot that she had a second prescription waiting at her pharmacy to be filled.  Patient called the pharmacy while he was in the room to confirm that prescription was ready.  Patient was requesting to increase her Percocet to 4 times a day which we will NOT do and continue at 3 times a day as needed. No further dose escalation beyond Percocet 10 mg 3 times daily as needed.  She is requesting to increase her Robaxin from 500 mg to 750 mg which is reasonable.  Will increase to 750 mg 3 times daily as needed.  Patient's last urine drug screen did not show any oxycodone.  Patient states that she ran out of oxycodone prior to her clinic visit on 07/15/2017 and for that reason did not have any in her system. It was documented that  patient's Rx bottle was empty when she brought it in during her last visit.  The patient has not taken any oxycodone over the last 2 weeks given that she forgot she had a second prescription available.  I will repeat urine drug screen today.  This should be negative for oxycodone and its metabolites.  Plan:  -Repeat urine drug screen.  Should be negative for oxycodone since the patient has not taken this medication in over 1 week. -Refill Percocet as below for 1 month.  Patient already has prescription to be picked up at pharmacy.  Current prescription provided for 10/02/2017.  No further dose escalation beyond this. -Prescription for Robaxin at higher dose of 750 mg 3 times daily as needed -Continue psychiatric care -Consider lumbar facet medial branch nerve blocks for axial low back pain upon follow-up for possible lumbar spondylosis and facet arthropathy -Consider bilateral SI joint injections for possible SI joint arthropathy   Plan of Care  Pharmacotherapy (Medications Ordered): Meds ordered  this encounter  Medications  . methocarbamol (ROBAXIN) 750 MG tablet    Sig: Take 1 tablet (750 mg total) by mouth every 8 (eight) hours as needed for muscle spasms.    Dispense:  90 tablet    Refill:  0    Do not place this medication, or any other prescription from our practice, on "Automatic Refill". Patient may have prescription filled one day early if pharmacy is closed on scheduled refill date.  Marland Kitchen oxyCODONE-acetaminophen (PERCOCET) 10-325 MG tablet    Sig: Take 1 tablet by mouth 3 (three) times daily as needed for pain.    Dispense:  90 tablet    Refill:  0    For chronic pain To last for 30 days from fill date To fill on or after: 10/02/17  Do not place this medication, or any other prescription from our practice, on "Automatic Refill". Patient may have prescription filled one day early if pharmacy is closed on scheduled refill date.   Lab-work, procedure(s), and/or referral(s): Orders  Placed This Encounter  Procedures  . ToxASSURE Select 13 (MW), Urine   Provider-requested follow-up: Return in about 8 weeks (around 10/29/2017) for Medication Management. Time Note: Greater than 50% of the 25 minute(s) of face-to-face time spent with Ms. Whitebread, was spent in counseling/coordination of care regarding: the appropriate use of the pain scale, opioid tolerance, "Drug Holidays", Ms. Okeefe primary cause of pain, the treatment plan, treatment alternatives, medication side effects, the opioid analgesic risks and possible complications, the appropriate use of her medications, realistic expectations, the goals of pain management (increased in functionality), the need to bring and keep the BMI below 30, the medication agreement and the patient's responsibilities when it comes to controlled substances.  Future Appointments  Date Time Provider [place]  10/10/2017  2:30 PM Kathyrn Drown, MD [facility]  10/22/2017  1:45 PM Gillis Santa, MD [facility] None  11/03/2017  2:45 PM Norman Clay, MD [facility] None  06/23/2018  2:00 PM Megan Salon, MD [place] None    Primary Care Physician: Kathyrn Drown, MD Location: [facility] Note by: Gillis Santa, M.D Date: 09/03/2017; Time: 4:28 PM  There are no Patient Instructions on file for this visit.

## 2017-09-03 NOTE — Progress Notes (Signed)
Nursing Pain Medication Assessment:  Safety precautions to be maintained throughout the outpatient stay will include: orient to surroundings, keep bed in low position, maintain call bell within reach at all times, provide assistance with transfer out of bed and ambulation.  Medication Inspection Compliance: [name] did not comply with our request to bring her pills to be counted. She was reminded that bringing the medication bottles, even when empty, is a requirement.  Medication: None brought in. Pill/Patch Count: None available to be counted. Bottle Appearance: No container available. Did not bring bottle(s) to appointment. Filled Date: N/A Last Medication intake:  08/15/17

## 2017-09-09 LAB — TOXASSURE SELECT 13 (MW), URINE

## 2017-09-23 ENCOUNTER — Other Ambulatory Visit: Payer: Self-pay | Admitting: Family Medicine

## 2017-10-03 ENCOUNTER — Other Ambulatory Visit: Payer: Self-pay | Admitting: Obstetrics & Gynecology

## 2017-10-03 MED ORDER — LIDOCAINE 5 % EX OINT: 1.0000 "application " | TOPICAL_OINTMENT | Freq: Two times a day (BID) | CUTANEOUS | 1 refills | Status: DC | PRN

## 2017-10-06 ENCOUNTER — Other Ambulatory Visit: Payer: Self-pay

## 2017-10-06 ENCOUNTER — Other Ambulatory Visit: Payer: Self-pay | Admitting: Obstetrics & Gynecology

## 2017-10-06 MED ORDER — LIDOCAINE HCL 2 % EX GEL: 1.0000 "application " | Freq: Two times a day (BID) | CUTANEOUS | 1 refills | Status: DC

## 2017-10-06 NOTE — Telephone Encounter (Signed)
This rx declined as it was for Xylocaine Solution.  I sent in an RX for Xylocaine 2% gel.  Ok to close encounter.

## 2017-10-06 NOTE — Telephone Encounter (Signed)
Medication refill request: Lidocaine 2% gel  Last AEX:  04/14/17 Next AEX: 06/23/18 Last MMG (if hormonal medication request): 08/17/15 Bi-reds category 1 neg  Refill authorized: Pharmacy sent a fax that stated that 5% Lidocaine was not covered. I called the Patient and she is fine with the 2%

## 2017-10-10 ENCOUNTER — Encounter: Payer: Self-pay | Admitting: Family Medicine

## 2017-10-10 ENCOUNTER — Ambulatory Visit (INDEPENDENT_AMBULATORY_CARE_PROVIDER_SITE_OTHER): Payer: PPO | Admitting: Family Medicine

## 2017-10-10 VITALS — BP 140/86 | Ht 66.0 in | Wt 247.0 lb

## 2017-10-10 DIAGNOSIS — M19041 Primary osteoarthritis, right hand: Secondary | ICD-10-CM | POA: Diagnosis not present

## 2017-10-10 DIAGNOSIS — M19042 Primary osteoarthritis, left hand: Secondary | ICD-10-CM | POA: Diagnosis not present

## 2017-10-10 DIAGNOSIS — F988 Other specified behavioral and emotional disorders with onset usually occurring in childhood and adolescence: Secondary | ICD-10-CM | POA: Diagnosis not present

## 2017-10-10 MED ORDER — AMPHETAMINE-DEXTROAMPHET ER 30 MG PO CP24: ORAL_CAPSULE | ORAL | 0 refills | Status: DC

## 2017-10-10 NOTE — Progress Notes (Signed)
   Subjective:    Patient ID: Lauren Greene, female    DOB: 05/02/1965, 52 y.o.   MRN: [id number]  HPI  Patient arrives for a follow up on ADHD. Patient states she is doing well on current meds. Medications do help her focus Without it she has difficult time staying on track This patient has adult ADD. Takes medication responsibly. Medication does help the patient focus in be more functional. Patient relates that they are or not abusing the medication or misusing the medication. The patient understands that if they're having any negative side effects such as elevated high blood pressure severe headaches they would need stop the medication follow-up immediately. They also understand that the prescriptions are to last for 3 months then the patient will need to follow-up before having further prescriptions.    Review of Systems  Constitutional: Negative for activity change, appetite change and fatigue.  HENT: Negative for congestion.   Respiratory: Negative for cough.   Cardiovascular: Negative for chest pain.  Gastrointestinal: Negative for abdominal pain.  Skin: Negative for color change.  Neurological: Negative for headaches.  Psychiatric/Behavioral: Negative for behavioral problems.       Objective:   Physical Exam  Constitutional: She appears well-developed and well-nourished.  HENT:  Head: Normocephalic.  Cardiovascular: Normal rate, regular rhythm and normal heart sounds.  No murmur heard. Pulmonary/Chest: Effort normal and breath sounds normal.  Neurological: She is alert.  Skin: Skin is warm and dry.  Psychiatric: She has a normal mood and affect.  Vitals reviewed.         Assessment & Plan:  Adult ADD Drug registry check 3 scripts given 15 minutes was spent with patient today discussing healthcare issues which they came.  More than 50% of this visit-total duration of visit-was spent in counseling and coordination of care.  Please see diagnosis regarding the  focus of this coordination and care  Severe osteoarthritis uses anti-inflammatories currently has seen rheumatology continue current measures

## 2017-10-20 NOTE — Progress Notes (Signed)
[facility] MD/PA/NP OP Progress Note  11/03/2017 4:15 PM Lauren Greene  MRN:  [id number]  Chief Complaint:  Chief Complaint    Follow-up; Depression     HPI:  The patient presents for follow-up appointment for depression.  She states that she has been trying to be active by doing yard work.  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  She states that she has been suffering from pain since teenager.  She had knee surgery in her teen. She wants to go outside and being around with people. She enjoys Engineer, maintenance (IT). She talks about an episode of yelling at group of people at [employer] as they were very loud and did not turn down the music. She received complaint and her employer made her go to [facility] for possible anger issues. She regret what she did given this consequences, although she still does not like people who does not show respect to other people. She feels lucky to continue her work. She denies feeling depressed. She has fair sleep. She has good motivation and denies anhedonia. She denies SI. She occasionally feels anxious. She denies panic attacks.  She took valium four times since the last appointment.   Wt Readings from Last 3 Encounters:  11/03/17 241 lb (109.3 kg)  10/22/17 238 lb (108 kg)  10/10/17 247 lb (112 kg)   Per PMP,  Valium last filled on 12/04/2016 On Dextroamp-Amphet Er 30 Mg Cap, oxycodone  Visit Diagnosis:    ICD-10-CM   1. Moderate episode of recurrent major depressive disorder (HCC) F33.1     Past Psychiatric History: Please see initial evaluation for full details. I have reviewed the history. No updates at this time.     Past Medical History:  Past Medical History:  Diagnosis Date  . ADHD (attention deficit hyperactivity disorder)   . Arthritis   . Back pain   . Broken femur (HCC) 08/27/12  . Migraine   . Osteoarthritis     Past Surgical History:  Procedure Laterality Date  . BACK  SURGERY    . CERVICAL BIOPSY  W/ LOOP ELECTRODE EXCISION  1990's   in [place], [place]  . ENDOMETRIAL BIOPSY  04-05-05 and 02-12-06   04-05-05 benign but too rapid growth. RXd Provera and repeat Bx 20mo. 02-12-06 Bx benign--Dr. Leda Quail  . FEMUR IM NAIL Left 08/27/2012   Procedure: INTRAMEDULLARY (IM) RETROGRADE FEMORAL NAILING;  Surgeon: Kathryne Hitch, MD;  Location: [facility] OR;  Service: Orthopedics;  Laterality: Left;  . HEEL SPUR SURGERY    . IM NAILING FEMORAL SHAFT FRACTURE Left 08/27/2012   Dr Magnus Ivan  . SHOULDER SURGERY Left   . TOTAL KNEE ARTHROPLASTY     x2  . TUBAL LIGATION    . VAGINAL HYSTERECTOMY      Family Psychiatric History: Please see initial evaluation for full details. I have reviewed the history. No updates at this time.     Family History:  Family History  Problem Relation Age of Onset  . Cancer Mother   . Breast cancer Mother   . Bipolar disorder Mother   . Heart failure Father   . Cancer Father   . Cancer Other   . Heart failure Other   . Bipolar disorder Sister     Social History:  Social History   Socioeconomic History  . Marital status: Single    Spouse name: Not on file  . Number of  children: Not on file  . Years of education: Not on file  . Highest education level: Not on file  Occupational History  . Not on file  Social Needs  . Financial resource strain: Not on file  . Food insecurity:    Worry: Not on file    Inability: Not on file  . Transportation needs:    Medical: Not on file    Non-medical: Not on file  Tobacco Use  . Smoking status: Former Smoker    Years: 10.00    Types: Cigarettes    Last attempt to quit: 08/16/2000    Years since quitting: 17.2  . Smokeless tobacco: Never Used  Substance and Sexual Activity  . Alcohol use: Yes    Alcohol/week: 0.0 standard drinks    Comment: rarely  . Drug use: No  . Sexual activity: Not Currently    Partners: Female  Lifestyle  . Physical activity:    Days per week: Not  on file    Minutes per session: Not on file  . Stress: Not on file  Relationships  . Social connections:    Talks on phone: Not on file    Gets together: Not on file    Attends religious service: Not on file    Active member of club or organization: Not on file    Attends meetings of clubs or organizations: Not on file    Relationship status: Not on file  Other Topics Concern  . Not on file  Social History Narrative  . Not on file    Allergies:  Allergies  Allergen Reactions  . Wellbutrin [Bupropion]     Anger issues  . Lyrica [Pregabalin] Swelling    Leg swelling    Metabolic Disorder Labs: Lab Results  Component Value Date   HGBA1C 5.6 07/22/2014   MPG 103 02/16/2013   MPG 111 07/24/2010   No results found for: PROLACTIN Lab Results  Component Value Date   CHOL 195 03/15/2017   TRIG 119 03/15/2017   HDL 55 03/15/2017   CHOLHDL 3.5 03/15/2017   VLDL 13 07/10/2012   LDLCALC 116 (H) 03/15/2017   LDLCALC 108 (H) 01/12/2016   Lab Results  Component Value Date   TSH 4.104 06/24/2013   TSH 4.142 01/07/2013    Therapeutic Level Labs: No results found for: LITHIUM No results found for: VALPROATE No components found for:  CBMZ  Current Medications: Current Outpatient Medications  Medication Sig Dispense Refill  . amphetamine-dextroamphetamine (ADDERALL XR) 30 MG 24 hr capsule 2 qam 60 capsule 0  . clobetasol ointment (TEMOVATE) 0.05 %     . DULoxetine (CYMBALTA) 60 MG capsule Take 2 capsules (120 mg total) by mouth daily. 180 capsule 0  . lidocaine (XYLOCAINE) 2 % jelly Apply 1 application topically 2 (two) times daily. 4 mL 1  . lidocaine (XYLOCAINE) 2 % solution TAKE 5 TO 10 MLS BY MOUTH TWICE DAILY AS NEEDED. 100 mL 0  . methocarbamol (ROBAXIN) 750 MG tablet Take 1 tablet (750 mg total) by mouth every 8 (eight) hours as needed for muscle spasms. 90 tablet 4  . nystatin (MYCOSTATIN/NYSTOP) powder APPLY TO AFFECTED AREA(S) TOPICALLY TWICE DAILY. 45 g 6  .  omeprazole (PRILOSEC) 40 MG capsule TAKE (1) CAPSULE BY MOUTH ONCE DAILY. 30 capsule 12  . oxyCODONE-acetaminophen (PERCOCET) 10-325 MG tablet 1-2 tablets TID prn For chronic pain To last for 30 days from fill date MAX 105/month 105 tablet 0  . traZODone (DESYREL) 100 MG tablet  100-200 mg at night as needed for sleep 60 tablet 1  . diazepam (VALIUM) 2 MG tablet Take 1 tablet (2 mg total) by mouth daily as needed for anxiety. 30 tablet 0  . Diclofenac Sodium CR 100 MG 24 hr tablet TAKE 1 TABLET BY MOUTH TWICE DAILY. TAKE WITH FOOD. 60 tablet 5   No current facility-administered medications for this visit.      Musculoskeletal: Strength & Muscle Tone: within normal limits Gait & Station: normal Patient leans: N/A  Psychiatric Specialty Exam: Review of Systems  Musculoskeletal: Positive for back pain.  Psychiatric/Behavioral: Negative for depression, hallucinations, memory loss, substance abuse and suicidal ideas. The patient is nervous/anxious. The patient does not have insomnia.   All other systems reviewed and are negative.   Blood pressure 128/76, pulse 74, height 5\' 6"  (1.676 m), weight 241 lb (109.3 kg), last menstrual period 03/18/2004, SpO2 97 %.Body mass index is 38.9 kg/m.  General Appearance: Fairly Groomed  Eye Contact:  Good  Speech:  Clear and Coherent  Volume:  Normal  Mood:  "fine"  Affect:  Appropriate, Congruent and down at times, but reactive  Thought Process:  Coherent  Orientation:  Full (Time, Place, and Person)  Thought Content: Logical   Suicidal Thoughts:  No  Homicidal Thoughts:  No  Memory:  Immediate;   Good  Judgement:  Good  Insight:  Fair  Psychomotor Activity:  Normal  Concentration:  Concentration: Good and Attention Span: Good  Recall:  Good  Fund of Knowledge: Good  Language: Good  Akathisia:  No  Handed:  Right  AIMS (if indicated): not done  Assets:  Communication Skills Desire for Improvement  ADL's:  Intact  Cognition: WNL   Sleep:  Fair   Screenings: PHQ2-9     [facility] Clinical [facility]/2017 [facility] Office [facility]20[facility]2017 [facility]  PHQ-2 Total Score  0  0  0  0  0       Assessment and Plan:  Melburn Hakelaine G Carothers is a 52 y.o. year old female with a history of epression, ADHD per chart, anxiety, hyperhydrosis, chronic pain , who presents for follow up appointment for Moderate episode of recurrent major depressive disorder (HCC)  # MDD, moderate, recurrent without psychotic features Patient denies significant neurovegetative symptoms since the last appointment.  Will continue duloxetine to target neurovegetative symptoms and pain.  Discussed risk of hypertension.  Although her irritability is likely secondary to pain, will continue to monitor and consider adjunctive treatment as indicated.  Will continue trazodone as needed for insomnia.  Will continue Valium as needed for anxiety.  Discussed risk of dependence and oversedation.  Discussed behavioral activation.   # ADHD She was diagnosed with ADHD per PCP and has been on Adderall.  Will continue to monitor.   Plan I have reviewed and updated plans as below 1  Continue duloxetine 120 mg daily  2. Continue Trazodone 100 - 200 mg at night as needed for sleep 3. Continue valium 2 mg daily as needed for anxiety (she declines refill at this time) 4.Return to clinic in threemonthsfor 30 mins (Patient is on Adderall XR 30 mg daily prescribed by her PCP)  The patient demonstrates the following  risk factors for suicide: Chronic risk factors for suicide include: psychiatric disorder of  depressionand chronic pain. Acute risk factorsfor suicide include: loss (financial, interpersonal, professional). Protective factorsfor this patient include: responsibility to others (children, family), coping skills and hope for the future. Considering these factors, the overall suicide risk at this point appears to be low. Patient isappropriate for outpatient follow up.  The duration of this appointment visit was 30 minutes of face-to-face time with the patient.  Greater than 50% of this time was spent in counseling, explanation of  diagnosis, planning of further management, and coordination of care.  Neysa Hotter, MD 11/03/2017, 4:15 PM

## 2017-10-22 ENCOUNTER — Ambulatory Visit
Payer: PPO | Attending: Student in an Organized Health Care Education/Training Program | Admitting: Student in an Organized Health Care Education/Training Program

## 2017-10-22 ENCOUNTER — Encounter: Payer: Self-pay | Admitting: Student in an Organized Health Care Education/Training Program

## 2017-10-22 ENCOUNTER — Other Ambulatory Visit: Payer: Self-pay

## 2017-10-22 VITALS — BP 128/84 | HR 90 | Temp 97.8°F | Resp 16 | Ht 68.0 in | Wt 238.0 lb

## 2017-10-22 DIAGNOSIS — M47816 Spondylosis without myelopathy or radiculopathy, lumbar region: Secondary | ICD-10-CM

## 2017-10-22 DIAGNOSIS — M545 Low back pain: Secondary | ICD-10-CM | POA: Diagnosis present

## 2017-10-22 DIAGNOSIS — Z6836 Body mass index (BMI) 36.0-36.9, adult: Secondary | ICD-10-CM | POA: Diagnosis not present

## 2017-10-22 DIAGNOSIS — M1288 Other specific arthropathies, not elsewhere classified, other specified site: Secondary | ICD-10-CM | POA: Diagnosis not present

## 2017-10-22 DIAGNOSIS — Z8781 Personal history of (healed) traumatic fracture: Secondary | ICD-10-CM | POA: Diagnosis not present

## 2017-10-22 DIAGNOSIS — Z8249 Family history of ischemic heart disease and other diseases of the circulatory system: Secondary | ICD-10-CM | POA: Diagnosis not present

## 2017-10-22 DIAGNOSIS — M5136 Other intervertebral disc degeneration, lumbar region: Secondary | ICD-10-CM | POA: Diagnosis not present

## 2017-10-22 DIAGNOSIS — Z87891 Personal history of nicotine dependence: Secondary | ICD-10-CM | POA: Insufficient documentation

## 2017-10-22 DIAGNOSIS — Z888 Allergy status to other drugs, medicaments and biological substances status: Secondary | ICD-10-CM | POA: Insufficient documentation

## 2017-10-22 DIAGNOSIS — Z79899 Other long term (current) drug therapy: Secondary | ICD-10-CM | POA: Insufficient documentation

## 2017-10-22 DIAGNOSIS — M21611 Bunion of right foot: Secondary | ICD-10-CM | POA: Insufficient documentation

## 2017-10-22 DIAGNOSIS — R61 Generalized hyperhidrosis: Secondary | ICD-10-CM | POA: Diagnosis not present

## 2017-10-22 DIAGNOSIS — Z90710 Acquired absence of both cervix and uterus: Secondary | ICD-10-CM | POA: Insufficient documentation

## 2017-10-22 DIAGNOSIS — M797 Fibromyalgia: Secondary | ICD-10-CM | POA: Diagnosis not present

## 2017-10-22 DIAGNOSIS — F909 Attention-deficit hyperactivity disorder, unspecified type: Secondary | ICD-10-CM | POA: Insufficient documentation

## 2017-10-22 DIAGNOSIS — Z803 Family history of malignant neoplasm of breast: Secondary | ICD-10-CM | POA: Insufficient documentation

## 2017-10-22 DIAGNOSIS — G894 Chronic pain syndrome: Secondary | ICD-10-CM | POA: Diagnosis not present

## 2017-10-22 DIAGNOSIS — Z96653 Presence of artificial knee joint, bilateral: Secondary | ICD-10-CM | POA: Diagnosis not present

## 2017-10-22 DIAGNOSIS — K219 Gastro-esophageal reflux disease without esophagitis: Secondary | ICD-10-CM | POA: Insufficient documentation

## 2017-10-22 DIAGNOSIS — I10 Essential (primary) hypertension: Secondary | ICD-10-CM | POA: Insufficient documentation

## 2017-10-22 DIAGNOSIS — M609 Myositis, unspecified: Secondary | ICD-10-CM | POA: Diagnosis not present

## 2017-10-22 DIAGNOSIS — Z818 Family history of other mental and behavioral disorders: Secondary | ICD-10-CM | POA: Diagnosis not present

## 2017-10-22 DIAGNOSIS — Z79891 Long term (current) use of opiate analgesic: Secondary | ICD-10-CM | POA: Insufficient documentation

## 2017-10-22 HISTORY — DX: Spondylosis without myelopathy or radiculopathy, lumbar region: M47.816

## 2017-10-22 MED ORDER — METHOCARBAMOL 750 MG PO TABS: 750.0000 mg | ORAL_TABLET | Freq: Three times a day (TID) | ORAL | 4 refills | Status: DC | PRN

## 2017-10-22 MED ORDER — OXYCODONE-ACETAMINOPHEN 10-325 MG PO TABS: ORAL_TABLET | ORAL | 0 refills | Status: DC

## 2017-10-22 MED ORDER — OXYCODONE-ACETAMINOPHEN 10-325 MG PO TABS
ORAL_TABLET | ORAL | 0 refills | Status: DC
Start: 2017-10-22 — End: 2018-01-27

## 2017-10-22 NOTE — Progress Notes (Signed)
Nursing Pain Medication Assessment:  Safety precautions to be maintained throughout the outpatient stay will include: orient to surroundings, keep bed in low position, maintain call bell within reach at all times, provide assistance with transfer out of bed and ambulation.  Medication Inspection Compliance: Pill count conducted under aseptic conditions, in front of the patient. Neither the pills nor the bottle was removed from the patient's sight at any time. Once count was completed pills were immediately returned to the patient in their original bottle.  Medication: Oxycodone/APAP Pill/Patch Count: 43 of 90 pills remain Pill/Patch Appearance: Markings consistent with prescribed medication Bottle Appearance: Standard pharmacy container. Clearly labeled. Filled Date: 7 / 22 / 2019 Last Medication intake:  Today

## 2017-10-22 NOTE — Progress Notes (Signed)
Patient's Name: Lauren Greene  MRN: [id number]  Referring Provider: Kathyrn Drown, MD  DOB: May 23, 1965  PCP: Kathyrn Drown, MD  DOS: 10/22/2017  Note by: Gillis Santa, MD  Service setting: Ambulatory outpatient  Specialty: Interventional Pain Management  Location: [facility] (AMB) Pain Management Facility    Patient type: Established   Primary Reason(s) for Visit: Encounter for prescription drug management. (Level of risk: moderate)  CC: Back Pain (lower)  HPI  Lauren Greene is a 52 y.o. year old, female patient, who comes today for a medication management evaluation. She has ADD (attention deficit disorder); Periprosthetic fracture around internal prosthetic left knee joint; Depression, major, in remission ([place]); Chronic arthralgias of knees and hips; Chronic back pain; Arthritis; Myalgia and myositis; Elevated blood pressure; Morbid obesity ([place]); Osteoarthritis; Hyperhydrosis disorder; Moderate episode of recurrent major depressive disorder ([place]); Bunion, right; Lumbar degenerative disc disease; GERD (gastroesophageal reflux disease); Chronic, continuous use of opioids; and Fibromyalgia on their problem list. Her primarily concern today is the Back Pain (lower)  Pain Assessment: Location: Lower Back Radiating: hip/buttocks right Onset: More than a month ago Duration:   Quality: Burning, Aching, Sharp, Nagging, Discomfort, Constant Severity: 6 /10 (subjective, self-reported pain score)  Note: Reported level is inconsistent with clinical observations.                         When using our objective Pain Scale, levels between 6 and 10/10 are said to belong in an emergency room, as it progressively worsens from a 6/10, described as severely limiting, requiring emergency care not usually available at an outpatient pain management facility. At a 6/10 level, communication becomes difficult and requires great effort. Assistance to reach the emergency department may be required. Facial flushing and  profuse sweating along with potentially dangerous increases in heart rate and blood pressure will be evident. Effect on ADL: when patient bends over it burns since April Timing:   Modifying factors: medications BP: 128/84  HR: 90  Lauren Greene was last scheduled for an appointment on 09/03/2017 for medication management. During today's appointment we reviewed Lauren Greene's chronic pain status, as well as her outpatient medication regimen.  Patient follows up today for medication refill.  Patient does endorse moderate benefit with increased dose of Robaxin at 750 mg.  She continues her Percocet at 10 mg 3 times a day.  Patient states that she occasionally needs to take a fourth dose especially on days that she has worked outside in her garden.  Patient's previous chronic opioid dose was 20 mg 3 times a day.  We can consider increasing her monthly quantity from 90-1 05 so she can occasionally take a fourth dose.  No further dose escalations beyond this.  The patient  reports that she does not use drugs. Her body mass index is 36.19 kg/m.  Further details on both, my assessment(s), as well as the proposed treatment plan, please Lauren below.  Controlled Substance Pharmacotherapy Assessment REMS (Risk Evaluation and Mitigation Strategy)  Analgesic: Percocet 10 mg TID  MME/day: 40-45 mg/day.  Lauren Specking, RN  10/22/2017  2:40 PM  Sign at close encounter Nursing Pain Medication Assessment:  Safety precautions to be maintained throughout the outpatient stay will include: orient to surroundings, keep bed in low position, maintain call bell within reach at all times, provide assistance with transfer out of bed and ambulation.  Medication Inspection Compliance: Pill count conducted under aseptic conditions, in front of the patient. Neither  the pills nor the bottle was removed from the patient's sight at any time. Once count was completed pills were immediately returned to the patient in their original  bottle.  Medication: Oxycodone/APAP Pill/Patch Count: 43 of 90 pills remain Pill/Patch Appearance: Markings consistent with prescribed medication Bottle Appearance: Standard pharmacy container. Clearly labeled. Filled Date: 7 / 22 / 2019 Last Medication intake:  Today   Pharmacokinetics: Liberation and absorption (onset of action): WNL Distribution (time to peak effect): WNL Metabolism and excretion (duration of action): WNL         Pharmacodynamics: Desired effects: Analgesia: Lauren Greene reports >50% benefit. Functional ability: Patient reports that medication allows her to accomplish basic ADLs Clinically meaningful improvement in function (CMIF): Sustained CMIF goals met Perceived effectiveness: Described as relatively effective, allowing for increase in activities of daily living (ADL) Undesirable effects: Side-effects or Adverse reactions: None reported Monitoring:  PMP: Online review of the past 32-monthperiod conducted. Compliant with practice rules and regulations Last UDS on record: Summary  Date Value Ref Range Status  09/03/2017 FINAL  Final    Comment:    ==================================================================== TOXASSURE SELECT 13 (MW) ==================================================================== Test                             Result       Flag       Units Drug Present   Amphetamine                    >3378                   ng/mg creat    Amphetamine is available as a schedule II prescription drug. ==================================================================== Test                      Result    Flag   Units      Ref Range   Creatinine              296              mg/dL      >=20 ==================================================================== Declared Medications:  Medication list was not provided. ==================================================================== For clinical consultation, please call [phone]  [phone] ====================================================================    UDS interpretation: Compliant          Medication Assessment Form: Reviewed. Patient indicates being compliant with therapy Treatment compliance: Compliant Risk Assessment Profile: Aberrant behavior: Lauren prior evaluations. None observed or detected today Comorbid factors increasing risk of overdose: Lauren prior notes. No additional risks detected today Risk of substance use disorder (SUD): Low Opioid Risk Tool - 10/22/17 1435      Personal History of Substance Abuse   Alcohol  Negative    Illegal Drugs  Negative    Rx Drugs  Negative      Psychological Disease   Psychological Disease  Negative    ADD  Positive    OCD  Negative    Bipolar  Negative mother and sister Bipolar   mother and sister Bipolar   Schizophrenia  Negative    Depression  Negative      Total Score   Opioid Risk Tool Scoring  0    Opioid Risk Interpretation  Low Risk      ORT Scoring interpretation table:  Score <3 = Low Risk for SUD  Score between 4-7 = Moderate Risk for SUD  Score >8 = High Risk for Opioid Abuse  Risk Mitigation Strategies:  Patient Counseling: Covered Patient-Prescriber Agreement (PPA): Present and active  Notification to other healthcare providers: Done  Pharmacologic Plan: Therapy adjustment: Patient sometimes has to take 4th tablet on days that she is active after work. Will increase monthly quantity to #105 so on some days she can take a fourth dose if she needs it.             Laboratory Chemistry  Inflammation Markers (CRP: Acute Phase) (ESR: Chronic Phase) Lab Results  Component Value Date   CRP 1.7 08/03/2014   ESRSEDRATE 2 08/03/2014                         Rheumatology Markers Lab Results  Component Value Date   RF 12 06/24/2013   ANA Negative 08/03/2014                        Renal Function Markers Lab Results  Component Value Date   BUN 13 04/15/2017   CREATININE 0.58  04/15/2017   BCR 22 04/15/2017   GFRAA 123 04/15/2017   GFRNONAA 106 04/15/2017                             Hepatic Function Markers Lab Results  Component Value Date   AST 26 03/15/2017   ALT 26 03/15/2017   ALBUMIN 4.3 03/15/2017   ALKPHOS 117 03/15/2017   LIPASE 25 01/12/2016                        Electrolytes Lab Results  Component Value Date   NA 142 04/15/2017   K 4.6 04/15/2017   CL 102 04/15/2017   CALCIUM 9.5 04/15/2017                        Neuropathy Markers Lab Results  Component Value Date   HGBA1C 5.6 07/22/2014                        Bone Pathology Markers Lab Results  Component Value Date   VD25OH 31 01/07/2013                         Coagulation Parameters Lab Results  Component Value Date   INR 1.04 02/16/2013   LABPROT 13.6 02/16/2013   APTT 29 02/16/2013   PLT 274 01/12/2016                        Cardiovascular Markers Lab Results  Component Value Date   CKTOTAL 139 08/03/2014   CKMB 2.8 06/24/2013   HGB 14.6 01/12/2016   HCT 43.4 01/12/2016                         CA Markers No results found for: CEA, CA125, LABCA2                      Note: Lab results reviewed.  Recent Diagnostic Imaging Results  MR Lumbar Spine W Wo Contrast CLINICAL DATA:  Back pain with right lower extremity pain. Prior lumbar surgery.  EXAM: MRI LUMBAR SPINE WITHOUT AND WITH CONTRAST  TECHNIQUE: Multiplanar and multiecho pulse sequences of the lumbar spine were obtained without and with intravenous contrast.  CONTRAST:  4m MULTIHANCE GADOBENATE DIMEGLUMINE 529 MG/ML IV SOLN  COMPARISON:  Lumbar spine MRI 02/17/2014  FINDINGS: Segmentation: Normal. The lowest disc space is considered to be L5-S1.  Alignment:  Grade 1 retrolisthesis at L1-2 and L2-3, unchanged.  Vertebrae: No acute compression fracture, facet edema or focal marrow lesion.  Conus medullaris: Extends to the L1 level and appears normal.  Paraspinal and other soft  tissues: The visualized aorta, IVC and iliac vessels are normal. The visualized retroperitoneal organs and paraspinal soft tissues are normal.  Disc levels:  T10-T11: Evaluated on sagittal images only. Small disc herniation without spinal Greene or neural foraminal stenosis.  T11-T12: Evaluated on sagittal images only. Small disc herniation. No spinal Greene or neural foraminal stenosis.  T12-L1: Normal disc space and facet joints. No spinal Greene stenosis. No neuroforaminal stenosis.  L1-L2: Narrowing of the intervertebral disc space with disc edema. Mild facet hypertrophy. Small disc osteophyte complex. No spinal Greene stenosis. Moderate bilateral, right worse than left, neuroforaminal stenosis. This is unchanged.  L2-L3: Diffuse disc bulge with mild narrowing of the lateral recesses. Mild unchanged spinal Greene stenosis. Moderate bilateral unchanged neuroforaminal stenosis.  L3-L4: Diffuse disc bulge with superimposed central protrusion, unchanged. Bilateral mild facet hypertrophy. Unchanged moderate spinal Greene stenosis. Moderate bilateral neural foraminal stenosis. This level is unchanged.  L4-L5: Diffuse disc bulge with narrowing of the left-greater-than-right lateral recesses. No spinal Greene stenosis. Mild right and moderate left neuroforaminal stenosis. This level is unchanged.  L5-S1: Left eccentric disc bulge and moderate bilateral facet hypertrophy. No spinal Greene stenosis. Moderate left neuroforaminal stenosis, unchanged.  IMPRESSION: 1. Worsened disc edema at L1-L2 with unchanged neuroforaminal stenosis, right greater than left. 2. Unchanged moderate spinal Greene stenosis at L3-L4. There is also moderate bilateral neural foraminal narrowing at this level, also unchanged. 3. Unchanged left eccentric disc bulge at L5-S1 narrowing the left neural foramen.  Electronically Signed   By: KUlyses JarredM.D.   On: 12/01/2015 02:19  Complexity Note: Imaging  results reviewed. Results shared with [name] using Layman's terms.                         Meds   Current Outpatient Medications:  .  amphetamine-dextroamphetamine (ADDERALL XR) 30 MG 24 hr capsule, 2 qam, Disp: 60 capsule, Rfl: 0 .  clobetasol ointment (TEMOVATE) 0.05 %, , Disp: , Rfl:  .  Diclofenac Sodium CR 100 MG 24 hr tablet, TAKE 1 TABLET BY MOUTH TWICE DAILY. TAKE WITH FOOD., Disp: 60 tablet, Rfl: 5 .  DULoxetine (CYMBALTA) 60 MG capsule, Take 2 capsules (120 mg total) by mouth daily., Disp: 180 capsule, Rfl: 0 .  lidocaine (XYLOCAINE) 2 % jelly, Apply 1 application topically 2 (two) times daily., Disp: 4 mL, Rfl: 1 .  lidocaine (XYLOCAINE) 2 % solution, TAKE 5 TO 10 MLS BY MOUTH TWICE DAILY AS NEEDED., Disp: 100 mL, Rfl: 0 .  methocarbamol (ROBAXIN) 750 MG tablet, Take 1 tablet (750 mg total) by mouth every 8 (eight) hours as needed for muscle spasms., Disp: 90 tablet, Rfl: 4 .  nystatin (MYCOSTATIN/NYSTOP) powder, APPLY TO AFFECTED AREA(S) TOPICALLY TWICE DAILY., Disp: 45 g, Rfl: 6 .  omeprazole (PRILOSEC) 40 MG capsule, TAKE (1) CAPSULE BY MOUTH ONCE DAILY., Disp: 30 capsule, Rfl: 12 .  oxyCODONE-acetaminophen (PERCOCET) 10-325 MG tablet, 1-2 tablets TID prn For chronic pain To last for 30 days from fill date MAX 105/month, Disp: 105 tablet, Rfl: 0 .  traZODone (DESYREL) 100 MG tablet,  Take 100 mg by mouth at bedtime. 2 tablets at bedtimr, Disp: , Rfl:   ROS  Constitutional: Denies any fever or chills Gastrointestinal: No reported hemesis, hematochezia, vomiting, or acute GI distress Musculoskeletal: Denies any acute onset joint swelling, redness, loss of ROM, or weakness Neurological: No reported episodes of acute onset apraxia, aphasia, dysarthria, agnosia, amnesia, paralysis, loss of coordination, or loss of consciousness  Allergies  Lauren Greene is allergic to wellbutrin [bupropion] and lyrica [pregabalin].  [place]  Drug: Lauren Greene  reports that she does not use  drugs. Alcohol:  reports that she drinks alcohol. Tobacco:  reports that she quit smoking about 17 years ago. Her smoking use included cigarettes. She quit after 10.00 years of use. She has never used smokeless tobacco. Medical:  has a past medical history of ADHD (attention deficit hyperactivity disorder), Arthritis, Back pain, Broken femur ([place]) (08/27/12), Migraine, and Osteoarthritis. Surgical: Lauren Greene  has a past surgical history that includes Total knee arthroplasty; Heel spur surgery; Back surgery; IM nailing femoral shaft fracture (Left, 08/27/2012); Femur IM nail (Left, 08/27/2012); Cervical biopsy w/ loop electrode excision (1990's); Endometrial biopsy (04-05-05 and 02-12-06); Tubal ligation; Shoulder surgery (Left); and Vaginal hysterectomy. Family: family history includes Bipolar disorder in her mother and sister; Breast cancer in her mother; Cancer in her father, mother, and other; Heart failure in her father and other.  Constitutional Exam  General appearance: Well nourished, well developed, and well hydrated. In no apparent acute distress Vitals:   10/22/17 1424  BP: 128/84  Pulse: 90  Resp: 16  Temp: 97.8 F (36.6 C)  SpO2: 99%  Weight: 238 lb (108 kg)  Height: '5\' 8"'$  (1.727 m)   BMI Assessment: Estimated body mass index is 36.19 kg/m as calculated from the following:   Height as of this encounter: '5\' 8"'$  (1.727 m).   Weight as of this encounter: 238 lb (108 kg).  BMI interpretation table: BMI level Category Range association with higher incidence of chronic pain  <18 kg/m2 Underweight   18.5-24.9 kg/m2 Ideal body weight   25-29.9 kg/m2 Overweight Increased incidence by 20%  30-34.9 kg/m2 Obese (Class I) Increased incidence by 68%  35-39.9 kg/m2 Severe obesity (Class II) Increased incidence by 136%  >40 kg/m2 Extreme obesity (Class III) Increased incidence by 254%   Patient's current BMI Ideal Body weight  Body mass index is 36.19 kg/m. Ideal body weight: 63.9 kg  (140 lb 14 oz) Adjusted ideal body weight: 81.5 kg (179 lb 11.6 oz)   BMI Readings from Last 4 Encounters:  10/22/17 36.19 kg/m  10/10/17 39.87 kg/m  09/03/17 36.49 kg/m  07/15/17 36.49 kg/m   Wt Readings from Last 4 Encounters:  10/22/17 238 lb (108 kg)  10/10/17 247 lb (112 kg)  09/03/17 240 lb (108.9 kg)  07/15/17 240 lb (108.9 kg)  Psych/Mental status: Alert, oriented x 3 (person, place, & time)       Eyes: PERLA Respiratory: No evidence of acute respiratory distress  Cervical Spine Area Exam  Skin & Axial Inspection: No masses, redness, edema, swelling, or associated skin lesions Alignment: Symmetrical Functional ROM: Unrestricted ROM      Stability: No instability detected Muscle Tone/Strength: Functionally intact. No obvious neuro-muscular anomalies detected. Sensory (Neurological): Unimpaired Palpation: No palpable anomalies              Upper Extremity (UE) Exam    Side: Right upper extremity  Side: Left upper extremity  Skin & Extremity Inspection: Skin color, temperature, and hair growth  are WNL. No peripheral edema or cyanosis. No masses, redness, swelling, asymmetry, or associated skin lesions. No contractures.  Skin & Extremity Inspection: Skin color, temperature, and hair growth are WNL. No peripheral edema or cyanosis. No masses, redness, swelling, asymmetry, or associated skin lesions. No contractures.  Functional ROM: Unrestricted ROM          Functional ROM: Unrestricted ROM          Muscle Tone/Strength: Functionally intact. No obvious neuro-muscular anomalies detected.  Muscle Tone/Strength: Functionally intact. No obvious neuro-muscular anomalies detected.  Sensory (Neurological): Unimpaired          Sensory (Neurological): Unimpaired          Palpation: No palpable anomalies              Palpation: No palpable anomalies              Provocative Test(s):  Phalen's test: deferred Tinel's test: deferred Apley's scratch test (touch opposite shoulder):   Action 1 (Across chest): deferred Action 2 (Overhead): deferred Action 3 (LB reach): deferred   Provocative Test(s):  Phalen's test: deferred Tinel's test: deferred Apley's scratch test (touch opposite shoulder):  Action 1 (Across chest): deferred Action 2 (Overhead): deferred Action 3 (LB reach): deferred    Thoracic Spine Area Exam  Skin & Axial Inspection: No masses, redness, or swelling Alignment: Symmetrical Functional ROM: Unrestricted ROM Stability: No instability detected Muscle Tone/Strength: Functionally intact. No obvious neuro-muscular anomalies detected. Sensory (Neurological): Unimpaired Muscle strength & Tone: No palpable anomalies  Lumbar Spine Area Exam  Skin & Axial Inspection: No masses, redness, or swelling Alignment: Symmetrical Functional ROM: Decreased ROM       Stability: No instability detected Muscle Tone/Strength: Functionally intact. No obvious neuro-muscular anomalies detected. Sensory (Neurological): Articular pain pattern Palpation: Complains of area being tender to palpation       Provocative Tests: Hyperextension/rotation test: (+) bilaterally for facet joint pain. Lumbar quadrant test (Kemp's test): (+) bilaterally for facet joint pain. Lateral bending test: (+) due to pain. Patrick's Maneuver: (+) for bilateral S-I arthralgia             FABER test: deferred today                   S-I anterior distraction/compression test: deferred today         S-I lateral compression test: deferred today         S-I Thigh-thrust test: deferred today         S-I Gaenslen's test: deferred today           Gait & Posture Assessment  Ambulation: Limited Gait: Antalgic Posture: Difficulty standing up straight, due to pain   Lower Extremity Exam    Side: Right lower extremity  Side: Left lower extremity  Stability: No instability observed          Stability: No instability observed          Skin & Extremity Inspection: Skin color, temperature, and hair  growth are WNL. No peripheral edema or cyanosis. No masses, redness, swelling, asymmetry, or associated skin lesions. No contractures.  Skin & Extremity Inspection: Skin color, temperature, and hair growth are WNL. No peripheral edema or cyanosis. No masses, redness, swelling, asymmetry, or associated skin lesions. No contractures.  Functional ROM: Unrestricted ROM                  Functional ROM: Unrestricted ROM  Muscle Tone/Strength: Functionally intact. No obvious neuro-muscular anomalies detected.  Muscle Tone/Strength: Functionally intact. No obvious neuro-muscular anomalies detected.  Sensory (Neurological): Unimpaired  Sensory (Neurological): Unimpaired  Palpation: No palpable anomalies  Palpation: No palpable anomalies   Assessment  Primary Diagnosis & Pertinent Problem List: The primary encounter diagnosis was Lumbar spondylosis. Diagnoses of Lumbar degenerative disc disease, Lumbar facet arthropathy, Fibromyalgia, and Chronic pain syndrome were also pertinent to this visit.  Status Diagnosis  Worsening Worsening Worsening 1. Lumbar spondylosis   2. Lumbar degenerative disc disease   3. Lumbar facet arthropathy   4. Fibromyalgia   5. Chronic pain syndrome      General Recommendations: The pain condition that the patient suffers from is best treated with a multidisciplinary approach that involves an increase in physical activity to prevent de-conditioning and worsening of the pain cycle, as well as psychological counseling (formal and/or informal) to address the co-morbid psychological affects of pain. Treatment will often involve judicious use of pain medications and interventional procedures to decrease the pain, allowing the patient to participate in the physical activity that will ultimately produce long-lasting pain reductions. The goal of the multidisciplinary approach is to return the patient to a higher level of overall function and to restore their ability to  perform activities of daily living.  52 year old female with a history of morbid obesity, chronic back pain, bilateral knee pain secondary to bilateral total knee arthroplasty, back pain secondary to lumbar degenerative disc disease, multilevel lumbar disc herniations, facet arthropathy along with a diagnosis of fibromyalgia. Patient does have a history of a broken femur in June 2014. She is status post left shoulder surgery status post bilateral total knee arthroplasty. She also has an intramedullary nail in her femoral shaft.   Patient follows up today for medication refill.  Patient does endorse moderate benefit with increased dose of Robaxin at 750 mg.  She continues her Percocet at 10 mg 3 times a day.  Patient states that she occasionally needs to take a fourth dose especially on days that she has worked outside in her garden.  Patient's previous chronic opioid dose was 20 mg 3 times a day.  We can consider increasing her monthly quantity from 90--->105 (10 mg TID-QID PRN) so she can occasionally take a fourth dose.  No further dose escalations beyond this.  Lauren Greene has a history of greater than 3 months of moderate to severe pain which is resulted in functional impairment.  The patient has tried various conservative therapeutic options such as NSAIDs, Tylenol, muscle relaxants, physical therapy which was inadequately effective.  Patient's pain is predominantly axial with physical exam findings suggestive of facet arthropathy.  Lumbar facet medial branch nerve blocks were discussed with the patient.  Risks and benefits were reviewed.  Patient would like to proceed with bilateral L3, L4, L5 medial branch nerve block.  Plan: -Rx for Percocet 10 mg TID as needed, quantity 105/month.  Absolutely no dose escalation beyond this. -Refill of Robaxin at current dose -Plan for bilateral lumbar facet medial branch nerve blocks at L3, L4, L5 under fluoroscopy with sedation for lumbar spondylosis  and facet arthropathy. -Consider bilateral SI joint injections for possible SI joint arthropathy   Plan of Care  Pharmacotherapy (Medications Ordered): Meds ordered this encounter  Medications  . methocarbamol (ROBAXIN) 750 MG tablet    Sig: Take 1 tablet (750 mg total) by mouth every 8 (eight) hours as needed for muscle spasms.    Dispense:  90 tablet  Refill:  4    Do not place this medication, or any other prescription from our practice, on "Automatic Refill". Patient may have prescription filled one day early if pharmacy is closed on scheduled refill date.  Marland Kitchen DISCONTD: oxyCODONE-acetaminophen (PERCOCET) 10-325 MG tablet    Sig: 1-2 tablets TID prn For chronic pain To last for 30 days from fill date MAX 105/month    Dispense:  105 tablet    Refill:  0    For chronic pain To last for 30 days from fill date To fill on or after: 11/05/17, 12/05/17, 01/03/18  Do not place this medication, or any other prescription from our practice, on "Automatic Refill". Patient may have prescription filled one day early if pharmacy is closed on scheduled refill date  . DISCONTD: oxyCODONE-acetaminophen (PERCOCET) 10-325 MG tablet    Sig: 1-2 tablets TID prn For chronic pain To last for 30 days from fill date MAX 105/month    Dispense:  105 tablet    Refill:  0    For chronic pain To last for 30 days from fill date To fill on or after: 11/05/17, 12/05/17, 01/03/18  Do not place this medication, or any other prescription from our practice, on "Automatic Refill". Patient may have prescription filled one day early if pharmacy is closed on scheduled refill date  . oxyCODONE-acetaminophen (PERCOCET) 10-325 MG tablet    Sig: 1-2 tablets TID prn For chronic pain To last for 30 days from fill date MAX 105/month    Dispense:  105 tablet    Refill:  0    For chronic pain To last for 30 days from fill date To fill on or after: 11/05/17, 12/05/17, 01/03/18  Do not place this medication, or any other  prescription from our practice, on "Automatic Refill". Patient may have prescription filled one day early if pharmacy is closed on scheduled refill date   Lab-work, procedure(s), and/or referral(s): Orders Placed This Encounter  Procedures  . LUMBAR FACET(MEDIAL BRANCH NERVE BLOCK) MBNB   Provider-requested follow-up: Return in about 2 weeks (around 11/05/2017) for Procedure.   Time Note: Greater than 50% of the 25 minute(s) of face-to-face time spent with Lauren Greene, was spent in counseling/coordination of care regarding: the appropriate use of the pain scale, opioid tolerance, "Drug Holidays", Lauren Greene primary cause of pain, the treatment plan, treatment alternatives, the risks and possible complications of proposed treatment, medication side effects, going over the informed consent, the opioid analgesic risks and possible complications, the appropriate use of her medications, realistic expectations, the goals of pain management (increased in functionality), the need to bring and keep the BMI below 30, the medication agreement and the patient's responsibilities when it comes to controlled substances.  Future Appointments  Date Time Provider [place]  11/03/2017  2:45 PM Norman Clay, MD [facility] None  11/05/2017 12:00 PM Gillis Santa, MD [facility] None  01/09/2018  2:30 PM Kathyrn Drown, MD [facility]  01/21/2018  2:15 PM Gillis Santa, MD [facility] None  06/23/2018  2:00 PM Megan Salon, MD [place] None    Primary Care Physician: Kathyrn Drown, MD Location: [facility] Note by: Gillis Santa, M.D Date: 10/22/2017; Time: 3:41 PM  Patient Instructions  ____________________________________________________________________________________________  Preparing for Procedure with Sedation  Instructions: . Oral Intake: Do not eat or drink anything for at least 8 hours prior to your procedure. . Transportation: Public transportation is not  allowed. Bring an adult driver. The driver must be  physically present in our waiting room before any procedure can be started. Marland Kitchen Physical Assistance: Bring an adult physically capable of assisting you, in the event you need help. This adult should keep you company at home for at least 6 hours after the procedure. . Blood Pressure Medicine: Take your blood pressure medicine with a sip of water the morning of the procedure. . Blood thinners: Notify our staff if you are taking any blood thinners. Depending on which one you take, there will be specific instructions on how and when to stop it. . Diabetics on insulin: Notify the staff so that you can be scheduled 1st case in the morning. If your diabetes requires high dose insulin, take only  of your normal insulin dose the morning of the procedure and notify the staff that you have done so. . Preventing infections: Shower with an antibacterial soap the morning of your procedure. . Build-up your immune system: Take 1000 mg of Vitamin C with every meal (3 times a day) the day prior to your procedure. Marland Kitchen Antibiotics: Inform the staff if you have a condition or reason that requires you to take antibiotics before dental procedures. . Pregnancy: If you are pregnant, call and cancel the procedure. . Sickness: If you have a cold, fever, or any active infections, call and cancel the procedure. . Arrival: You must be in the facility at least 30 minutes prior to your scheduled procedure. . Children: Do not bring children with you. . Dress appropriately: Bring dark clothing that you would not mind if they get stained. . Valuables: Do not bring any jewelry or valuables.  Procedure appointments are reserved for interventional treatments only. Marland Kitchen No Prescription Refills. . No medication changes will be discussed during procedure appointments. . No disability issues will be discussed.  Reasons to call and reschedule or cancel your procedure: (Following these  recommendations will minimize the risk of a serious complication.) . Surgeries: Avoid having procedures within 2 weeks of any surgery. (Avoid for 2 weeks before or after any surgery). . Flu Shots: Avoid having procedures within 2 weeks of a flu shots or . (Avoid for 2 weeks before or after immunizations). . Barium: Avoid having a procedure within 7-10 days after having had a radiological study involving the use of radiological contrast. (Myelograms, Barium swallow or enema study). . Heart attacks: Avoid any elective procedures or surgeries for the initial 6 months after a "Myocardial Infarction" (Heart Attack). . Blood thinners: It is imperative that you stop these medications before procedures. Let us know if you if you take any blood thinner.  . Infection: Avoid procedures during or within two weeks of an infection (including chest colds or gastrointestinal problems). Symptoms associated with infections include: Localized redness, fever, chills, night sweats or profuse sweating, burning sensation when voiding, cough, congestion, stuffiness, runny nose, sore throat, diarrhea, nausea, vomiting, cold or Flu symptoms, recent or current infections. It is specially important if the infection is over the area that we intend to treat. Marland Kitchen Heart and lung problems: Symptoms that may suggest an active cardiopulmonary problem include: cough, chest pain, breathing difficulties or shortness of breath, dizziness, ankle swelling, uncontrolled high or unusually low blood pressure, and/or palpitations. If you are experiencing any of these symptoms, cancel your procedure and contact your primary care physician for an evaluation.  Remember:  Regular Business hours are:  Monday to Thursday 8:00 AM to 4:00 PM  Provider's Schedule: Milinda Pointer, MD:  Procedure days: Tuesday and Thursday 7:30 AM to  4:00 PM  Gillis Santa, MD:  Procedure days: Monday and Wednesday 7:30 AM to 4:00  PM ____________________________________________________________________________________________   Facet Joint Block The facet joints connect the bones of the spine (vertebrae). They make it possible for you to bend, twist, and make other movements with your spine. They also keep you from bending too far, twisting too far, and making other excessive movements. A facet joint block is a procedure where a numbing medicine (anesthetic) is injected into a facet joint. Often, a type of anti-inflammatory medicine called a steroid is also injected. A facet joint block may be done to diagnose neck or back pain. If the pain gets better after a facet joint block, it means the pain is probably coming from the facet joint. If the pain does not get better, it means the pain is probably not coming from the facet joint. A facet joint block may also be done to relieve neck or back pain caused by an inflamed facet joint. A facet joint block is only done to relieve pain if the pain does not improve with other methods, such as medicine, exercise programs, and physical therapy. Tell a health care provider about:  Any allergies you have.  All medicines you are taking, including vitamins, herbs, eye drops, creams, and over-the-counter medicines.  Any problems you or family members have had with anesthetic medicines.  Any blood disorders you have.  Any surgeries you have had.  Any medical conditions you have.  Whether you are pregnant or may be pregnant. What are the risks? Generally, this is a safe procedure. However, problems may occur, including:  Bleeding.  Injury to a nerve near the injection site.  Pain at the injection site.  Weakness or numbness in areas controlled by nerves near the injection site.  Infection.  Temporary fluid retention.  Allergic reactions to medicines or dyes.  Injury to other structures or organs near the injection site.  What happens before the procedure?  Follow  instructions from your health care provider about eating or drinking restrictions.  Ask your health care provider about: ? Changing or stopping your regular medicines. This is especially important if you are taking diabetes medicines or blood thinners. ? Taking medicines such as aspirin and ibuprofen. These medicines can thin your blood. Do not take these medicines before your procedure if your health care provider instructs you not to.  Do not take any new dietary supplements or medicines without asking your health care provider first.  Plan to have someone take you home after the procedure. What happens during the procedure?  You may need to remove your clothing and dress in an open-back gown.  The procedure will be done while you are lying on an X-ray table. You will most likely be asked to lie on your stomach, but you may be asked to lie in a different position if an injection will be made in your neck.  Machines will be used to monitor your oxygen levels, heart rate, and blood pressure.  If an injection will be made in your neck, an IV tube will be inserted into one of your veins. Fluids and medicine will flow directly into your body through the IV tube.  The area over the facet joint where the injection will be made will be cleaned with soap. The surrounding skin will be covered with clean drapes.  A numbing medicine (local anesthetic) will be applied to your skin. Your skin may sting or burn for a moment.  A video  X-ray machine (fluoroscopy) will be used to locate the joint. In some cases, a CT scan may be used.  A contrast dye may be injected into the facet joint area to help locate the joint.  When the joint is located, an anesthetic will be injected into the joint through the needle.  Your health care provider will ask you whether you feel pain relief. If you do feel relief, a steroid may be injected to provide pain relief for a longer period of time. If you do not feel relief  or feel only partial relief, additional injections of an anesthetic may be made in other facet joints.  The needle will be removed.  Your skin will be cleaned.  A bandage (dressing) will be applied over each injection site. The procedure may vary among health care providers and hospitals. What happens after the procedure?  You will be observed for 15-30 minutes before being allowed to go home. This information is not intended to replace advice given to you by your health care provider. Make sure you discuss any questions you have with your health care provider. Document Released: 07/24/2006 Document Revised: 04/05/2015 Document Reviewed: 11/28/2014 Elsevier Interactive Patient Education  [employer].

## 2017-10-22 NOTE — Patient Instructions (Signed)
____________________________________________________________________________________________  Preparing for Procedure with Sedation  Instructions: . Oral Intake: Do not eat or drink anything for at least 8 hours prior to your procedure. . Transportation: Public transportation is not allowed. Bring an adult driver. The driver must be physically present in our waiting room before any procedure can be started. . Physical Assistance: Bring an adult physically capable of assisting you, in the event you need help. This adult should keep you company at home for at least 6 hours after the procedure. . Blood Pressure Medicine: Take your blood pressure medicine with a sip of water the morning of the procedure. . Blood thinners: Notify our staff if you are taking any blood thinners. Depending on which one you take, there will be specific instructions on how and when to stop it. . Diabetics on insulin: Notify the staff so that you can be scheduled 1st case in the morning. If your diabetes requires high dose insulin, take only  of your normal insulin dose the morning of the procedure and notify the staff that you have done so. . Preventing infections: Shower with an antibacterial soap the morning of your procedure. . Build-up your immune system: Take 1000 mg of Vitamin C with every meal (3 times a day) the day prior to your procedure. . Antibiotics: Inform the staff if you have a condition or reason that requires you to take antibiotics before dental procedures. . Pregnancy: If you are pregnant, call and cancel the procedure. . Sickness: If you have a cold, fever, or any active infections, call and cancel the procedure. . Arrival: You must be in the facility at least 30 minutes prior to your scheduled procedure. . Children: Do not bring children with you. . Dress appropriately: Bring dark clothing that you would not mind if they get stained. . Valuables: Do not bring any jewelry or valuables.  Procedure  appointments are reserved for interventional treatments only. . No Prescription Refills. . No medication changes will be discussed during procedure appointments. . No disability issues will be discussed.  Reasons to call and reschedule or cancel your procedure: (Following these recommendations will minimize the risk of a serious complication.) . Surgeries: Avoid having procedures within 2 weeks of any surgery. (Avoid for 2 weeks before or after any surgery). . Flu Shots: Avoid having procedures within 2 weeks of a flu shots or . (Avoid for 2 weeks before or after immunizations). . Barium: Avoid having a procedure within 7-10 days after having had a radiological study involving the use of radiological contrast. (Myelograms, Barium swallow or enema study). . Heart attacks: Avoid any elective procedures or surgeries for the initial 6 months after a "Myocardial Infarction" (Heart Attack). . Blood thinners: It is imperative that you stop these medications before procedures. Let us know if you if you take any blood thinner.  . Infection: Avoid procedures during or within two weeks of an infection (including chest colds or gastrointestinal problems). Symptoms associated with infections include: Localized redness, fever, chills, night sweats or profuse sweating, burning sensation when voiding, cough, congestion, stuffiness, runny nose, sore throat, diarrhea, nausea, vomiting, cold or Flu symptoms, recent or current infections. It is specially important if the infection is over the area that we intend to treat. . Heart and lung problems: Symptoms that may suggest an active cardiopulmonary problem include: cough, chest pain, breathing difficulties or shortness of breath, dizziness, ankle swelling, uncontrolled high or unusually low blood pressure, and/or palpitations. If you are experiencing any of these symptoms, cancel   your procedure and contact your primary care physician for an evaluation.  Remember:    Regular Business hours are:  Monday to Thursday 8:00 AM to 4:00 PM  Provider's Schedule: Francisco Naveira, MD:  Procedure days: Tuesday and Thursday 7:30 AM to 4:00 PM  Bilal Lateef, MD:  Procedure days: Monday and Wednesday 7:30 AM to 4:00 PM ____________________________________________________________________________________________   Facet Joint Block The facet joints connect the bones of the spine (vertebrae). They make it possible for you to bend, twist, and make other movements with your spine. They also keep you from bending too far, twisting too far, and making other excessive movements. A facet joint block is a procedure where a numbing medicine (anesthetic) is injected into a facet joint. Often, a type of anti-inflammatory medicine called a steroid is also injected. A facet joint block may be done to diagnose neck or back pain. If the pain gets better after a facet joint block, it means the pain is probably coming from the facet joint. If the pain does not get better, it means the pain is probably not coming from the facet joint. A facet joint block may also be done to relieve neck or back pain caused by an inflamed facet joint. A facet joint block is only done to relieve pain if the pain does not improve with other methods, such as medicine, exercise programs, and physical therapy. Tell a health care provider about:  Any allergies you have.  All medicines you are taking, including vitamins, herbs, eye drops, creams, and over-the-counter medicines.  Any problems you or family members have had with anesthetic medicines.  Any blood disorders you have.  Any surgeries you have had.  Any medical conditions you have.  Whether you are pregnant or may be pregnant. What are the risks? Generally, this is a safe procedure. However, problems may occur, including:  Bleeding.  Injury to a nerve near the injection site.  Pain at the injection site.  Weakness or numbness in areas  controlled by nerves near the injection site.  Infection.  Temporary fluid retention.  Allergic reactions to medicines or dyes.  Injury to other structures or organs near the injection site.  What happens before the procedure?  Follow instructions from your health care provider about eating or drinking restrictions.  Ask your health care provider about: ? Changing or stopping your regular medicines. This is especially important if you are taking diabetes medicines or blood thinners. ? Taking medicines such as aspirin and ibuprofen. These medicines can thin your blood. Do not take these medicines before your procedure if your health care provider instructs you not to.  Do not take any new dietary supplements or medicines without asking your health care provider first.  Plan to have someone take you home after the procedure. What happens during the procedure?  You may need to remove your clothing and dress in an open-back gown.  The procedure will be done while you are lying on an X-ray table. You will most likely be asked to lie on your stomach, but you may be asked to lie in a different position if an injection will be made in your neck.  Machines will be used to monitor your oxygen levels, heart rate, and blood pressure.  If an injection will be made in your neck, an IV tube will be inserted into one of your veins. Fluids and medicine will flow directly into your body through the IV tube.  The area over the facet joint where   the injection will be made will be cleaned with soap. The surrounding skin will be covered with clean drapes.  A numbing medicine (local anesthetic) will be applied to your skin. Your skin may sting or burn for a moment.  A video X-ray machine (fluoroscopy) will be used to locate the joint. In some cases, a CT scan may be used.  A contrast dye may be injected into the facet joint area to help locate the joint.  When the joint is located, an anesthetic will  be injected into the joint through the needle.  Your health care provider will ask you whether you feel pain relief. If you do feel relief, a steroid may be injected to provide pain relief for a longer period of time. If you do not feel relief or feel only partial relief, additional injections of an anesthetic may be made in other facet joints.  The needle will be removed.  Your skin will be cleaned.  A bandage (dressing) will be applied over each injection site. The procedure may vary among health care providers and hospitals. What happens after the procedure?  You will be observed for 15-30 minutes before being allowed to go home. This information is not intended to replace advice given to you by your health care provider. Make sure you discuss any questions you have with your health care provider. Document Released: 07/24/2006 Document Revised: 04/05/2015 Document Reviewed: 11/28/2014 Elsevier Interactive Patient Education  2018 [employer]  

## 2017-11-03 ENCOUNTER — Ambulatory Visit (INDEPENDENT_AMBULATORY_CARE_PROVIDER_SITE_OTHER): Payer: PPO | Admitting: Psychiatry

## 2017-11-03 ENCOUNTER — Encounter (HOSPITAL_COMMUNITY): Payer: Self-pay | Admitting: Psychiatry

## 2017-11-03 VITALS — BP 128/76 | HR 74 | Ht 66.0 in | Wt 241.0 lb

## 2017-11-03 DIAGNOSIS — F331 Major depressive disorder, recurrent, moderate: Secondary | ICD-10-CM

## 2017-11-03 MED ORDER — DULOXETINE HCL 60 MG PO CPEP: 120.0000 mg | ORAL_CAPSULE | Freq: Every day | ORAL | 0 refills | Status: DC

## 2017-11-03 MED ORDER — TRAZODONE HCL 100 MG PO TABS: ORAL_TABLET | ORAL | 1 refills | Status: DC

## 2017-11-03 MED ORDER — DIAZEPAM 2 MG PO TABS: 2.0000 mg | ORAL_TABLET | Freq: Every day | ORAL | 0 refills | Status: DC | PRN

## 2017-11-03 NOTE — Patient Instructions (Signed)
1  Continue duloxetine 120 mg daily  2. Continue Trazodone 100 - 200 mg at night as needed for sleep 3. Continue valium 2 mg daily as needed for anxiety 4.Return to clinic in threemonthsfor 30 mins

## 2017-11-05 ENCOUNTER — Ambulatory Visit: Payer: PPO | Admitting: Student in an Organized Health Care Education/Training Program

## 2017-11-10 ENCOUNTER — Ambulatory Visit
Payer: PPO | Attending: Student in an Organized Health Care Education/Training Program | Admitting: Student in an Organized Health Care Education/Training Program

## 2017-11-15 ENCOUNTER — Other Ambulatory Visit: Payer: Self-pay | Admitting: Family Medicine

## 2017-11-18 NOTE — Telephone Encounter (Signed)
Pt states you did give her three scripts and she did not request this refill

## 2017-11-18 NOTE — Telephone Encounter (Signed)
Please talk with patient I believe we gave her 3 scripts on her last visit?

## 2017-11-19 NOTE — Telephone Encounter (Signed)
Send the pharmacy notification for cancel of this request patient already has refills

## 2017-12-01 ENCOUNTER — Encounter: Payer: Self-pay | Admitting: Student in an Organized Health Care Education/Training Program

## 2017-12-01 ENCOUNTER — Ambulatory Visit
Admission: RE | Admit: 2017-12-01 | Discharge: 2017-12-01 | Disposition: A | Discharge status: Home / Self Care | Payer: PPO | Source: Ambulatory Visit | Attending: Student in an Organized Health Care Education/Training Program | Admitting: Student in an Organized Health Care Education/Training Program

## 2017-12-01 ENCOUNTER — Other Ambulatory Visit: Payer: Self-pay

## 2017-12-01 ENCOUNTER — Ambulatory Visit (HOSPITAL_BASED_OUTPATIENT_CLINIC_OR_DEPARTMENT_OTHER): Payer: PPO | Admitting: Student in an Organized Health Care Education/Training Program

## 2017-12-01 VITALS — BP 125/77 | HR 56 | Temp 97.5°F | Resp 16 | Ht 68.0 in | Wt 240.0 lb

## 2017-12-01 DIAGNOSIS — M545 Low back pain: Secondary | ICD-10-CM | POA: Insufficient documentation

## 2017-12-01 DIAGNOSIS — M47816 Spondylosis without myelopathy or radiculopathy, lumbar region: Secondary | ICD-10-CM | POA: Insufficient documentation

## 2017-12-01 DIAGNOSIS — M1288 Other specific arthropathies, not elsewhere classified, other specified site: Secondary | ICD-10-CM | POA: Diagnosis not present

## 2017-12-01 DIAGNOSIS — Z96659 Presence of unspecified artificial knee joint: Secondary | ICD-10-CM | POA: Diagnosis not present

## 2017-12-01 DIAGNOSIS — Z79899 Other long term (current) drug therapy: Secondary | ICD-10-CM | POA: Diagnosis not present

## 2017-12-01 MED ORDER — DEXAMETHASONE SODIUM PHOSPHATE 10 MG/ML IJ SOLN
10.0000 mg | Freq: Once | INTRAMUSCULAR | Status: AC
  Administered 2017-12-01: 10 mg

## 2017-12-01 MED ORDER — LIDOCAINE HCL 2 % IJ SOLN
20.0000 mL | Freq: Once | INTRAMUSCULAR | Status: AC
  Administered 2017-12-01: 400 mg

## 2017-12-01 MED ORDER — FENTANYL CITRATE (PF) 100 MCG/2ML IJ SOLN
25.0000 ug | INTRAMUSCULAR | Status: DC | PRN
  Administered 2017-12-01: 50 ug via INTRAVENOUS

## 2017-12-01 MED ORDER — DEXAMETHASONE SODIUM PHOSPHATE 10 MG/ML IJ SOLN
INTRAMUSCULAR | Status: AC
  Filled 2017-12-01: qty 1

## 2017-12-01 MED ORDER — FENTANYL CITRATE (PF) 100 MCG/2ML IJ SOLN
INTRAMUSCULAR | Status: AC
  Filled 2017-12-01: qty 2

## 2017-12-01 MED ORDER — LIDOCAINE HCL 2 % IJ SOLN
INTRAMUSCULAR | Status: AC
  Filled 2017-12-01: qty 20

## 2017-12-01 MED ORDER — ROPIVACAINE HCL 2 MG/ML IJ SOLN
INTRAMUSCULAR | Status: AC
  Filled 2017-12-01: qty 10

## 2017-12-01 MED ORDER — LACTATED RINGERS IV SOLN
1000.0000 mL | Freq: Once | INTRAVENOUS | Status: AC
  Administered 2017-12-01: 1000 mL via INTRAVENOUS

## 2017-12-01 MED ORDER — ROPIVACAINE HCL 2 MG/ML IJ SOLN
10.0000 mL | Freq: Once | INTRAMUSCULAR | Status: AC
  Administered 2017-12-01: 10 mL

## 2017-12-01 NOTE — Progress Notes (Signed)
Patient's Name: Lauren Greene  MRN: [id number]  Referring Provider: Babs Sciara, MD  DOB: Jan 17, 1966  PCP: Babs Sciara, MD  DOS: 12/01/2017  Note by: Edward Jolly, MD  Service setting: Ambulatory outpatient  Specialty: Interventional Pain Management  Patient type: Established  Location: [facility] (AMB) Pain Management Facility  Visit type: Interventional Procedure   Primary Reason for Visit: Interventional Pain Management Treatment. CC: Back Pain (lower)  Procedure:          Anesthesia, Analgesia, Anxiolysis:  Type: Lumbar Facet, Medial Branch Block(s)          Primary Purpose: Diagnostic Region: Posterolateral Lumbosacral Spine Level: L3, L4, L5,  Medial Branch Level(s). Injecting these levels blocks the L3-4, L4-5 lumbar facet joints. Laterality: Bilateral  Type: Moderate (Conscious) Sedation combined with Local Anesthesia Indication(s): Analgesia and Anxiety Route: Intravenous (IV) IV Access: Secured Sedation: Meaningful verbal contact was maintained at all times during the procedure  Local Anesthetic: Lidocaine 1-2%   Indications: 1. Lumbar spondylosis   2. Lumbar facet arthropathy    Pain Score: Pre-procedure: 7 /10 Post-procedure: 0-No pain/10  Pre-op Assessment:  Lauren Greene is a 52 y.o. (year old), female patient, seen today for interventional treatment. She  has a past surgical history that includes Total knee arthroplasty; Heel spur surgery; Back surgery; IM nailing femoral shaft fracture (Left, 08/27/2012); Femur IM nail (Left, 08/27/2012); Cervical biopsy w/ loop electrode excision (1990's); Endometrial biopsy (04-05-05 and 02-12-06); Tubal ligation; Shoulder surgery (Left); and Vaginal hysterectomy. Lauren Greene has a current medication list which includes the following prescription(s): amphetamine-dextroamphetamine, clobetasol ointment, diazepam, diclofenac sodium cr, duloxetine, lidocaine, lidocaine, methocarbamol, nystatin, omeprazole, oxycodone-acetaminophen, and  trazodone, and the following Facility-Administered Medications: fentanyl. Her primarily concern today is the Back Pain (lower)  Initial Vital Signs:  Pulse/HCG Rate: 66ECG Heart Rate: 60 Temp: 98.3 F (36.8 C) Resp: 16 BP: 120/84 SpO2: 97 %  BMI: Estimated body mass index is 36.49 kg/m as calculated from the following:   Height as of this encounter: 5\' 8"  (1.727 m).   Weight as of this encounter: 240 lb (108.9 kg).  Risk Assessment: Allergies: Reviewed. She is allergic to wellbutrin [bupropion] and lyrica [pregabalin].  Allergy Precautions: None required Coagulopathies: Reviewed. None identified.  Blood-thinner therapy: None at this time Active Infection(s): Reviewed. None identified. Lauren Greene is afebrile  Site Confirmation: Lauren Greene was asked to confirm the procedure and laterality before marking the site Procedure checklist: Completed Consent: Before the procedure and under the influence of no sedative(s), amnesic(s), or anxiolytics, the patient was informed of the treatment options, risks and possible complications. To fulfill our ethical and legal obligations, as recommended by the American Medical Association's Code of Ethics, I have informed the patient of my clinical impression; the nature and purpose of the treatment or procedure; the risks, benefits, and possible complications of the intervention; the alternatives, including doing nothing; the risk(s) and benefit(s) of the alternative treatment(s) or procedure(s); and the risk(s) and benefit(s) of doing nothing. The patient was provided information about the general risks and possible complications associated with the procedure. These may include, but are not limited to: failure to achieve desired goals, infection, bleeding, organ or nerve damage, allergic reactions, paralysis, and death. In addition, the patient was informed of those risks and complications associated to Spine-related procedures, such as failure to  decrease pain; infection (i.e.: Meningitis, epidural or intraspinal abscess); bleeding (i.e.: epidural hematoma, subarachnoid hemorrhage, or any other type of intraspinal or peri-dural bleeding); organ or nerve damage (  i.e.: Any type of peripheral nerve, nerve root, or spinal cord injury) with subsequent damage to sensory, motor, and/or autonomic systems, resulting in permanent pain, numbness, and/or weakness of one or several areas of the body; allergic reactions; (i.e.: anaphylactic reaction); and/or death. Furthermore, the patient was informed of those risks and complications associated with the medications. These include, but are not limited to: allergic reactions (i.e.: anaphylactic or anaphylactoid reaction(s)); adrenal axis suppression; blood sugar elevation that in diabetics may result in ketoacidosis or comma; water retention that in patients with history of congestive heart failure may result in shortness of breath, pulmonary edema, and decompensation with resultant heart failure; weight gain; swelling or edema; medication-induced neural toxicity; particulate matter embolism and blood vessel occlusion with resultant organ, and/or nervous system infarction; and/or aseptic necrosis of one or more joints. Finally, the patient was informed that Medicine is not an exact science; therefore, there is also the possibility of unforeseen or unpredictable risks and/or possible complications that may result in a catastrophic outcome. The patient indicated having understood very clearly. We have given the patient no guarantees and we have made no promises. Enough time was given to the patient to ask questions, all of which were answered to the patient's satisfaction. Lauren Greene has indicated that she wanted to continue with the procedure. Attestation: I, the ordering provider, attest that I have discussed with the patient the benefits, risks, side-effects, alternatives, likelihood of achieving goals, and potential  problems during recovery for the procedure that I have provided informed consent. Date  Time: 12/01/2017 11:05 AM  Pre-Procedure Preparation:  Monitoring: As per clinic protocol. Respiration, ETCO2, SpO2, BP, heart rate and rhythm monitor placed and checked for adequate function Safety Precautions: Patient was assessed for positional comfort and pressure points before starting the procedure. Time-out: I initiated and conducted the "Time-out" before starting the procedure, as per protocol. The patient was asked to participate by confirming the accuracy of the "Time Out" information. Verification of the correct person, site, and procedure were performed and confirmed by me, the nursing staff, and the patient. "Time-out" conducted as per Joint Commission's Universal Protocol (UP.01.01.01). Time: 1219  Description of Procedure:          Position: Prone Laterality: Bilateral. The procedure was performed in identical fashion on both sides. Levels:  L3, L4, L5,  Medial Branch Level(s) Area Prepped: Posterior Lumbosacral Region Prepping solution: ChloraPrep (2% chlorhexidine gluconate and 70% isopropyl alcohol) Safety Precautions: Aspiration looking for blood return was conducted prior to all injections. At no point did we inject any substances, as a needle was being advanced. Before injecting, the patient was told to immediately notify me if she was experiencing any new onset of "ringing in the ears, or metallic taste in the mouth". No attempts were made at seeking any paresthesias. Safe injection practices and needle disposal techniques used. Medications properly checked for expiration dates. SDV (single dose vial) medications used. After the completion of the procedure, all disposable equipment used was discarded in the proper designated medical waste containers. Local Anesthesia: Protocol guidelines were followed. The patient was positioned over the fluoroscopy table. The area was prepped in the usual  manner. The time-out was completed. The target area was identified using fluoroscopy. A 12-in long, straight, sterile hemostat was used with fluoroscopic guidance to locate the targets for each level blocked. Once located, the skin was marked with an approved surgical skin marker. Once all sites were marked, the skin (epidermis, dermis, and hypodermis), as well as deeper  tissues (fat, connective tissue and muscle) were infiltrated with a small amount of a short-acting local anesthetic, loaded on a 10cc syringe with a 25G, 1.5-in  Needle. An appropriate amount of time was allowed for local anesthetics to take effect before proceeding to the next step. Local Anesthetic: Lidocaine 2.0% The unused portion of the local anesthetic was discarded in the proper designated containers. Technical explanation of process:   L3 Medial Branch Nerve Block (MBB): The target area for the L3 medial branch is at the junction of the postero-lateral aspect of the superior articular process and the superior, posterior, and medial edge of the transverse process of L4. Under fluoroscopic guidance, a Quincke needle was inserted until contact was made with os over the superior postero-lateral aspect of the pedicular shadow (target area). After negative aspiration for blood, 1mL of the nerve block solution was injected without difficulty or complication. The needle was removed intact. L4 Medial Branch Nerve Block (MBB): The target area for the L4 medial branch is at the junction of the postero-lateral aspect of the superior articular process and the superior, posterior, and medial edge of the transverse process of L5. Under fluoroscopic guidance, a Quincke needle was inserted until contact was made with os over the superior postero-lateral aspect of the pedicular shadow (target area). After negative aspiration for blood, 1mL of the nerve block solution was injected without difficulty or complication. The needle was removed intact. L5  Medial Branch Nerve Block (MBB): The target area for the L5 medial branch is at the junction of the postero-lateral aspect of the superior articular process and the superior, posterior, and medial edge of the sacral ala. Under fluoroscopic guidance, a Quincke needle was inserted until contact was made with os over the superior postero-lateral aspect of the pedicular shadow (target area). After negative aspiration for blood,1 mL of the nerve block solution was injected without difficulty or complication. The needle was removed intact.  Procedural Needles: 22-gauge, 3.5-inch, Quincke needles used for all levels. Nerve block solution: 10 cc solution made of 9 cc of 0.2% ropivacaine, 1 cc of Decadron 10 mg/cc.  1 to 1.5 cc injected at each level above bilaterally.  The unused portion of the solution was discarded in the proper designated containers.  Once the entire procedure was completed, the treated area was cleaned, making sure to leave some of the prepping solution back to take advantage of its long term bactericidal properties.   Illustration of the posterior view of the lumbar spine and the posterior neural structures. Laminae of L2 through S1 are labeled. DPRL5, dorsal primary ramus of L5; DPRS1, dorsal primary ramus of S1; DPR3, dorsal primary ramus of L3; FJ, facet (zygapophyseal) joint L3-L4; I, inferior articular process of L4; LB1, lateral branch of dorsal primary ramus of L1; IAB, inferior articular branches from L3 medial branch (supplies L4-L5 facet joint); IBP, intermediate branch plexus; MB3, medial branch of dorsal primary ramus of L3; NR3, third lumbar nerve root; S, superior articular process of L5; SAB, superior articular branches from L4 (supplies L4-5 facet joint also); TP3, transverse process of L3.  Vitals:   12/01/17 1240 12/01/17 1249 12/01/17 1259 12/01/17 1309  BP: 121/78 118/66 117/79 125/77  Pulse: (!) 56     Resp: 13 16 18 16   Temp:    (!) 97.5 F (36.4 C)  TempSrc:     Temporal  SpO2: 96% 98% 99% 100%  Weight:      Height:        Start  Time: 1219 hrs. End Time: 1238 hrs.  Imaging Guidance (Spinal):          Type of Imaging Technique: Fluoroscopy Guidance (Spinal) Indication(s): Assistance in needle guidance and placement for procedures requiring needle placement in or near specific anatomical locations not easily accessible without such assistance. Exposure Time: Please see nurses notes. Contrast: None used. Fluoroscopic Guidance: I was personally present during the use of fluoroscopy. "Tunnel Vision Technique" used to obtain the best possible view of the target area. Parallax error corrected before commencing the procedure. "Direction-depth-direction" technique used to introduce the needle under continuous pulsed fluoroscopy. Once target was reached, antero-posterior, oblique, and lateral fluoroscopic projection used confirm needle placement in all planes. Images permanently stored in EMR. Interpretation: No contrast injected. I personally interpreted the imaging intraoperatively. Adequate needle placement confirmed in multiple planes. Permanent images saved into the patient's record.  Antibiotic Prophylaxis:   Anti-infectives (From admission, onward)   None     Indication(s): None identified  Post-operative Assessment:  Post-procedure Vital Signs:  Pulse/HCG Rate: (!) 5660 Temp: (!) 97.5 F (36.4 C) Resp: 16 BP: 125/77 SpO2: 100 %  EBL: None  Complications: No immediate post-treatment complications observed by team, or reported by patient.  Note: The patient tolerated the entire procedure well. A repeat set of vitals were taken after the procedure and the patient was kept under observation following institutional policy, for this type of procedure. Post-procedural neurological assessment was performed, showing return to baseline, prior to discharge. The patient was provided with post-procedure discharge instructions, including a section on  how to identify potential problems. Should any problems arise concerning this procedure, the patient was given instructions to immediately contact us, at any time, without hesitation. In any case, we plan to contact the patient by telephone for a follow-up status report regarding this interventional procedure.  Comments:  No additional relevant information. 5 out of 5 strength bilateral lower extremity: Plantar flexion, dorsiflexion, knee flexion, knee extension.  Plan of Care   Imaging Orders     DG C-Arm 1-60 Min-No Report Procedure Orders    No procedure(s) ordered today    Medications ordered for procedure: Meds ordered this encounter  Medications  . lactated ringers infusion 1,000 mL  . fentaNYL (SUBLIMAZE) injection 25-100 mcg    Make sure Narcan is available in the pyxis when using this medication. In the event of respiratory depression (RR< 8/min): Titrate NARCAN (naloxone) in increments of 0.1 to 0.2 mg IV at 2-3 minute intervals, until desired degree of reversal.  . ropivacaine (PF) 2 mg/mL (0.2%) (NAROPIN) injection 10 mL  . lidocaine (XYLOCAINE) 2 % (with pres) injection 400 mg  . dexamethasone (DECADRON) injection 10 mg   Medications administered: We administered lactated ringers, fentaNYL, ropivacaine (PF) 2 mg/mL (0.2%), lidocaine, and dexamethasone.  See the medical record for exact dosing, route, and time of administration.  New Prescriptions   No medications on file   Disposition: Discharge home  Discharge Date & Time: 12/01/2017; 1310 hrs.   Physician-requested Follow-up:  Follow-up with Thad Ranger for medication management and postprocedural evaluation. Of note, no further increase in Percocet, maximum quantity # 105/month. If lumbar facet medial branch nerve blocks are effective, can schedule for second diagnostic series and possibly lumbar RFA thereafter.  If lumbar facet medial branch nerve blocks are not effective, consider SI joint involvement and would  talk with patient about diagnostic sacroiliac joint injection.  Future Appointments  Date Time Provider Department Center  01/09/2018  2:30 PM Luking,  Jonna Coup, MD [facility]  01/21/2018  2:30 PM Barbette Merino, NP [facility] None  02/02/2018  3:00 PM Neysa Hotter, MD [facility] None  06/23/2018  2:00 PM Jerene Bears, MD [facility] None   Primary Care Physician: Babs Sciara, MD Location: [facility] Note by: Edward Jolly, MD Date: 12/01/2017; Time: 1:47 PM  Disclaimer:  Medicine is not an exact science. The only guarantee in medicine is that nothing is guaranteed. It is important to note that the decision to proceed with this intervention was based on the information collected from the patient. The Data and conclusions were drawn from the patient's questionnaire, the interview, and the physical examination. Because the information was provided in large part by the patient, it cannot be guaranteed that it has not been purposely or unconsciously manipulated. Every effort has been made to obtain as much relevant data as possible for this evaluation. It is important to note that the conclusions that lead to this procedure are derived in large part from the available data. Always take into account that the treatment will also be dependent on availability of resources and existing treatment guidelines, considered by other Pain Management Practitioners as being common knowledge and practice, at the time of the intervention. For Medico-Legal purposes, it is also important to point out that variation in procedural techniques and pharmacological choices are the acceptable norm. The indications, contraindications, technique, and results of the above procedure should only be interpreted and judged by a Board-Certified Interventional Pain Specialist with extensive familiarity and expertise in the same exact procedure and technique.

## 2017-12-01 NOTE — Progress Notes (Signed)
Safety precautions to be maintained throughout the outpatient stay will include: orient to surroundings, keep bed in low position, maintain call bell within reach at all times, provide assistance with transfer out of bed and ambulation.  

## 2017-12-01 NOTE — Patient Instructions (Signed)

## 2017-12-02 ENCOUNTER — Telehealth: Payer: Self-pay

## 2017-12-02 NOTE — Telephone Encounter (Signed)
Post procedure phone call.  Left message.  

## 2018-01-09 ENCOUNTER — Encounter: Payer: Self-pay | Admitting: Family Medicine

## 2018-01-09 ENCOUNTER — Ambulatory Visit (INDEPENDENT_AMBULATORY_CARE_PROVIDER_SITE_OTHER): Payer: PPO | Admitting: Family Medicine

## 2018-01-09 VITALS — BP 124/78 | Temp 98.4°F | Ht 68.0 in | Wt 248.0 lb

## 2018-01-09 DIAGNOSIS — Z79899 Other long term (current) drug therapy: Secondary | ICD-10-CM

## 2018-01-09 DIAGNOSIS — F988 Other specified behavioral and emotional disorders with onset usually occurring in childhood and adolescence: Secondary | ICD-10-CM

## 2018-01-09 DIAGNOSIS — E785 Hyperlipidemia, unspecified: Secondary | ICD-10-CM | POA: Diagnosis not present

## 2018-01-09 DIAGNOSIS — M25532 Pain in left wrist: Secondary | ICD-10-CM | POA: Diagnosis not present

## 2018-01-09 DIAGNOSIS — R350 Frequency of micturition: Secondary | ICD-10-CM

## 2018-01-09 DIAGNOSIS — B351 Tinea unguium: Secondary | ICD-10-CM | POA: Diagnosis not present

## 2018-01-09 LAB — POCT URINALYSIS DIPSTICK
Spec Grav, UA: >=1.03 — AB (ref 1.010–1.025)
pH, UA: 5 (ref 5.0–8.0)

## 2018-01-09 MED ORDER — AMPHETAMINE-DEXTROAMPHET ER 30 MG PO CP24: ORAL_CAPSULE | ORAL | 0 refills | Status: DC

## 2018-01-09 MED ORDER — CEPHALEXIN 500 MG PO CAPS: 500.0000 mg | ORAL_CAPSULE | Freq: Four times a day (QID) | ORAL | 0 refills | Status: DC

## 2018-01-09 MED ORDER — ZOSTER VAC RECOMB ADJUVANTED 50 MCG/0.5ML IM SUSR: 0.5000 mL | Freq: Once | INTRAMUSCULAR | 1 refills | Status: AC

## 2018-01-09 NOTE — Progress Notes (Signed)
   Subjective:    Patient ID: Lauren Greene, female    DOB: 10/10/1965, 53 y.o.   MRN: [id number]  HPIADD check up.  She does take her ADD medicine she denies abusing it drug registry was checked she states the medicine does help her focus and stay attentive to what is in front of her she denies any side effects with it  Would like shingles vaccine.  A prescription was this was written for her.  The rationale was discussed.  Infection in fourth toe on the right foot and great toe on the left.  The patient has infection of the toe with some redness tenderness denies any injury to it.  Does have some problems with her feet.  Frequent urination. Off and on for awhile.  Patient denies dysuria more urinary frequency she has a history of a hysterectomy and states her bladder was tacked up She is tried medications before but has had side effects She denies fever chills or hematuria  Patient has a nail fungus she is concerned about she wonders if she can take Lamisil we talked about the potential toxicities and how we would need to culture the nails she defers on this currently  She is followed by pain management center.  Left wrist pain patient with left wrist pain present for 6 months aches with certain movements and rotations.  Denies any injury to it.  Has not tried anything for other than a brace which seems to help.  Very nice patient but has multiple health issues that require significant amount of time  Review of Systems  Constitutional: Negative for activity change, appetite change and fatigue.  HENT: Negative for congestion.   Respiratory: Negative for cough.   Cardiovascular: Negative for chest pain.  Gastrointestinal: Negative for abdominal pain.  Skin: Negative for color change.  Neurological: Negative for headaches.  Psychiatric/Behavioral: Negative for behavioral problems.       Objective:   Physical Exam  Constitutional: She appears well-developed and well-nourished.    HENT:  Head: Normocephalic.  Cardiovascular: Normal rate, regular rhythm and normal heart sounds.  No murmur heard. Pulmonary/Chest: Effort normal and breath sounds normal.  Neurological: She is alert.  Skin: Skin is warm and dry.  Psychiatric: She has a normal mood and affect.  Vitals reviewed.         Assessment & Plan:  Because of her medications metabolic 7 checked Lipid profile needs to be done because of hyperlipidemia healthy diet regular physical activity and weight loss recommended ADD medicines were prescribed and sent in patient follow-up in 3 months medication benefits are Left wrist pain x-rays ordered await the results  Left toe infection recommend antibiotics if ongoing troubles or worse follow-up  Toenail fungus hold off on medications patient will think about it if she decides she will need to do a toenail culture along with monitoring of liver enzymes  Urinary I frequency check urine culture-await the results may be a bladder issue may be something urology will need to see  Hyperlipidemia lab work ordered await the results  Because of medication use check metabolic 7 await the results  .b25 25 minutes was spent with the patient.  This statement verifies that 25 minutes was indeed spent with the patient.  More than 50% of this visit-total duration of the visit-was spent in counseling and coordination of care. The issues that the patient came in for today as reflected in the diagnosis (s) please refer to documentation for further details.

## 2018-01-10 ENCOUNTER — Ambulatory Visit (HOSPITAL_COMMUNITY)
Admission: RE | Admit: 2018-01-10 | Discharge: 2018-01-10 | Disposition: A | Discharge status: Home / Self Care | Payer: PPO | Source: Ambulatory Visit | Attending: Family Medicine | Admitting: Family Medicine

## 2018-01-10 DIAGNOSIS — M25532 Pain in left wrist: Secondary | ICD-10-CM | POA: Insufficient documentation

## 2018-01-10 DIAGNOSIS — M19032 Primary osteoarthritis, left wrist: Secondary | ICD-10-CM | POA: Diagnosis not present

## 2018-01-10 LAB — BASIC METABOLIC PANEL
BUN/Creatinine Ratio: 23 (ref 9–23)
BUN: 16 mg/dL (ref 6–24)
CO2: 22 mmol/L (ref 20–29)
Calcium: 9.2 mg/dL (ref 8.7–10.2)
Chloride: 103 mmol/L (ref 96–106)
Creatinine, Ser: 0.7 mg/dL (ref 0.57–1.00)
GFR calc Af Amer: 114 mL/min/{1.73_m2} (ref >59)
GFR calc non Af Amer: 99 mL/min/{1.73_m2} (ref >59)
Glucose: 75 mg/dL (ref 65–99)
Potassium: 4.5 mmol/L (ref 3.5–5.2)
Sodium: 142 mmol/L (ref 134–144)

## 2018-01-10 LAB — LIPID PANEL
Chol/HDL Ratio: 3.7 ratio (ref 0.0–4.4)
Cholesterol, Total: 185 mg/dL (ref 100–199)
HDL: 50 mg/dL (ref >39)
LDL Calculated: 115 mg/dL — ABNORMAL HIGH (ref 0–99)
Triglycerides: 102 mg/dL (ref 0–149)
VLDL Cholesterol Cal: 20 mg/dL (ref 5–40)

## 2018-01-11 LAB — URINE CULTURE

## 2018-01-11 LAB — SPECIMEN STATUS REPORT

## 2018-01-21 ENCOUNTER — Encounter: Payer: Self-pay | Admitting: Nurse Practitioner

## 2018-01-21 ENCOUNTER — Ambulatory Visit: Payer: PPO | Attending: Student in an Organized Health Care Education/Training Program | Admitting: Nurse Practitioner

## 2018-01-21 VITALS — BP 131/91 | HR 65 | Temp 97.9°F | Resp 16 | Ht 67.0 in | Wt 243.0 lb

## 2018-01-21 DIAGNOSIS — M797 Fibromyalgia: Secondary | ICD-10-CM | POA: Insufficient documentation

## 2018-01-21 DIAGNOSIS — M5136 Other intervertebral disc degeneration, lumbar region: Secondary | ICD-10-CM | POA: Insufficient documentation

## 2018-01-21 DIAGNOSIS — Z818 Family history of other mental and behavioral disorders: Secondary | ICD-10-CM | POA: Diagnosis not present

## 2018-01-21 DIAGNOSIS — Z79899 Other long term (current) drug therapy: Secondary | ICD-10-CM | POA: Diagnosis not present

## 2018-01-21 DIAGNOSIS — Z96659 Presence of unspecified artificial knee joint: Secondary | ICD-10-CM | POA: Diagnosis not present

## 2018-01-21 DIAGNOSIS — Z8249 Family history of ischemic heart disease and other diseases of the circulatory system: Secondary | ICD-10-CM | POA: Insufficient documentation

## 2018-01-21 DIAGNOSIS — Z87891 Personal history of nicotine dependence: Secondary | ICD-10-CM | POA: Diagnosis not present

## 2018-01-21 DIAGNOSIS — R030 Elevated blood-pressure reading, without diagnosis of hypertension: Secondary | ICD-10-CM | POA: Insufficient documentation

## 2018-01-21 DIAGNOSIS — K219 Gastro-esophageal reflux disease without esophagitis: Secondary | ICD-10-CM | POA: Diagnosis not present

## 2018-01-21 DIAGNOSIS — Z888 Allergy status to other drugs, medicaments and biological substances status: Secondary | ICD-10-CM | POA: Diagnosis not present

## 2018-01-21 DIAGNOSIS — Z6838 Body mass index (BMI) 38.0-38.9, adult: Secondary | ICD-10-CM | POA: Diagnosis not present

## 2018-01-21 DIAGNOSIS — M1990 Unspecified osteoarthritis, unspecified site: Secondary | ICD-10-CM | POA: Diagnosis not present

## 2018-01-21 DIAGNOSIS — Z79891 Long term (current) use of opiate analgesic: Secondary | ICD-10-CM | POA: Diagnosis not present

## 2018-01-21 DIAGNOSIS — F339 Major depressive disorder, recurrent, unspecified: Secondary | ICD-10-CM | POA: Diagnosis not present

## 2018-01-21 DIAGNOSIS — M47816 Spondylosis without myelopathy or radiculopathy, lumbar region: Secondary | ICD-10-CM | POA: Insufficient documentation

## 2018-01-21 DIAGNOSIS — F909 Attention-deficit hyperactivity disorder, unspecified type: Secondary | ICD-10-CM | POA: Diagnosis not present

## 2018-01-21 DIAGNOSIS — G894 Chronic pain syndrome: Secondary | ICD-10-CM | POA: Diagnosis not present

## 2018-01-21 NOTE — Progress Notes (Signed)
Patient's Name: Lauren Greene  MRN: [id number]  Referring Provider: Kathyrn Drown, MD  DOB: 07-Jun-1965  PCP: Lauren Drown, MD  DOS: 01/21/2018  Note by: Vevelyn Francois NP  Service setting: Ambulatory outpatient  Specialty: Interventional Pain Management  Location: [facility] (AMB) Pain Management Facility    Patient type: Established    Primary Reason(s) for Visit: Encounter for prescription drug management & post-procedure evaluation of chronic illness with mild to moderate exacerbation(Level of risk: moderate) CC: Back Pain (lumbar bilateral, right is worse); Wrist Pain (left); and Generalized Body Aches (arthritis )  HPI  Lauren Greene is a 52 y.o. year old, female patient, who comes today for a post-procedure evaluation and medication management. 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 Her primarily concern today is the Back Pain (lumbar bilateral, right is worse); Wrist Pain (left); and Generalized Body Aches (arthritis )  Pain Assessment: Location: Lower, Left, Right Back Radiating: denies Onset: More than a month ago Duration: Chronic pain Quality: Discomfort, Constant, Burning Severity: 7 /10 (subjective, self-reported pain score)  Note: Reported level is compatible with observation. Clinically the patient looks like a 2/10 A 2/10 is viewed as "Mild to Moderate" and described as noticeable and distracting.  Impossible to hide from other people. More frequent flare-ups. Still possible to adapt and function close to normal. It can be very annoying and may have occasional stronger flare-ups. With discipline, patients may get used to it and adapt. Information on the proper use of the pain scale provided to the patient today. When using our objective Pain Scale, levels between 6 and 10/10 are said to belong in an emergency room, as it progressively worsens from a 6/10, described as severely limiting, requiring emergency care not usually available at an outpatient pain management facility. At a 6/10 level, communication becomes difficult and requires great effort. Assistance to reach the emergency department may be required. Facial flushing and profuse sweating along with potentially dangerous increases in heart rate and blood pressure will be evident. Effect on ADL: unable to work a full-time job, difficult to stand or walk for long periods of time.  Timing: Constant Modifying factors: medications BP: (!) 131/91  HR: 65  Lauren Greene was last seen on 12/02/2017 for a procedure. During today's appointment we reviewed Lauren Greene's post-procedure results, as well as her outpatient medication regimen.  Further details on both, my assessment(s), as well as the proposed treatment plan, please see below.  Controlled Substance Pharmacotherapy Assessment REMS (Risk Evaluation and Mitigation Strategy)  Analgesic: Percocet 10 mg TID  MME/day: 40-45 mg/day.  Lauren Billow, RN  01/21/2018  3:27 PM  Sign at close encounter Nursing Pain Medication Assessment:  Safety precautions to be maintained throughout the outpatient stay will include: orient to surroundings, keep bed in low position, maintain call bell within reach at all times, provide assistance with transfer out of bed and ambulation.  Medication Inspection Compliance: Lauren Greene did not comply with our request to bring her pills to be counted. She was  reminded that bringing the medication bottles, even when empty,  is a requirement.  Medication: None brought in. Pill/Patch Count: None available to be counted. Bottle Appearance: No container available. Did not bring bottle(s) to appointment. Filled Date: N/A Last Medication intake:  Today   Pharmacokinetics: Liberation and absorption (onset of action): WNL Distribution (time to peak effect): WNL Metabolism and excretion (duration of action): WNL         Pharmacodynamics: Desired effects: Analgesia: Lauren Greene reports >50% benefit. Functional ability: Patient reports that medication allows her to accomplish basic ADLs Clinically meaningful improvement in function (CMIF): Sustained CMIF goals met Perceived effectiveness: Described as relatively effective, allowing for increase in activities of daily living (ADL) Undesirable effects: Side-effects or Adverse reactions: None reported Monitoring: [place] PMP: Online review of the past 27-monthperiod conducted. Compliant with practice rules and regulations Last UDS on record: Summary  Date Value Ref Range Status  09/03/2017 FINAL  Final    Comment:    ==================================================================== TOXASSURE SELECT 13 (MW) ==================================================================== Test                             Result       Flag       Units Drug Present   Amphetamine                    >3378                   ng/mg creat    Amphetamine is available as a schedule II prescription drug. ==================================================================== Test                      Result    Flag   Units      Ref Range   Creatinine              296              mg/dL      >=20 ==================================================================== Declared Medications:  Medication list was not provided. ==================================================================== For clinical consultation, please call  [phone] ====================================================================    UDS interpretation: Compliant          Medication Assessment Form: Reviewed. Patient indicates being compliant with therapy Treatment compliance: Compliant Risk Assessment Profile: Aberrant behavior: See prior evaluations. None observed or detected today Comorbid factors increasing risk of overdose: See prior notes. No additional risks detected today Opioid risk tool (ORT) (Total Score):   Personal History of Substance Abuse (SUD-Substance use disorder):  Alcohol:    Illegal Drugs:    Rx Drugs:    ORT Risk Level calculation:   Risk of substance use disorder (SUD): Low  ORT Scoring interpretation table:  Score <3 = Low Risk for SUD  Score between 4-7 = Moderate Risk for SUD  Score >8 = High Risk for Opioid Abuse   Risk Mitigation Strategies:  Patient Counseling: Covered Patient-Prescriber Agreement (PPA): Present and active  Notification to other healthcare providers: Done  Pharmacologic Plan: No change in therapy, at this time.             Post-Procedure Assessment  12/01/2017 Procedure: Bilateral lumbar facet nerve blocks Pre-procedure pain score:  7/10 Post-procedure pain score: 0/10         Influential Factors: BMI: 38.06 kg/m Intra-procedural challenges: None observed.         Assessment challenges: None detected.              Reported side-effects: None.  Post-procedural adverse reactions or complications: None reported         Sedation: Please see nurses note. When no sedatives are used, the analgesic levels obtained are directly associated to the effectiveness of the local anesthetics. However, when sedation is provided, the level of analgesia obtained during the initial 1 hour following the intervention, is believed to be the result of a combination of factors. These factors may include, but are not limited to: 1. The effectiveness of the local anesthetics used. 2. The  effects of the analgesic(s) and/or anxiolytic(s) used. 3. The degree of discomfort experienced by the patient at the time of the procedure. 4. The patients ability and reliability in recalling and recording the events. 5. The presence and influence of possible secondary gains and/or psychosocial factors. Reported result: Relief experienced during the 1st hour after the procedure: 80 % (Ultra-Short Term Relief)            Interpretative annotation: Clinically appropriate result. Analgesia during this period is likely to be Local Anesthetic and/or IV Sedative (Analgesic/Anxiolytic) related.          Effects of local anesthetic: The analgesic effects attained during this period are directly associated to the localized infiltration of local anesthetics and therefore cary significant diagnostic value as to the etiological location, or anatomical origin, of the pain. Expected duration of relief is directly dependent on the pharmacodynamics of the local anesthetic used. Long-acting (4-6 hours) anesthetics used.  Reported result: Relief during the next 4 to 6 hour after the procedure: 80 % (Short-Term Relief)            Interpretative annotation: Clinically appropriate result. Analgesia during this period is likely to be Local Anesthetic-related.          Long-term benefit: Defined as the period of time past the expected duration of local anesthetics (1 hour for short-acting and 4-6 hours for long-acting). With the possible exception of prolonged sympathetic blockade from the local anesthetics, benefits during this period are typically attributed to, or associated with, other factors such as analgesic sensory neuropraxia, antiinflammatory effects, or beneficial biochemical changes provided by agents other than the local anesthetics.  Reported result: Extended relief following procedure: 60 %(day 6 pain level was at 4 and day 10 at a 6 and by 12 pain was back) (Long-Term Relief)            Interpretative  annotation: Clinically possible results. Good relief. No permanent benefit expected. Inflammation plays a part in the etiology to the pain.          Current benefits: Defined as reported results that persistent at this point in time.   Analgesia: >50 %            Function: Somewhat improved ROM: Somewhat improved Interpretative annotation: Recurrence of symptoms. No permanent benefit expected. Effective diagnostic intervention.          Interpretation: Results would suggest a successful diagnostic intervention. We'll proceed with diagnostic intervention #2, as soon as convenient          Plan:  Please see "Plan of Care" for details.                Laboratory Chemistry  Inflammation Markers (CRP: Acute Phase) (ESR: Chronic Phase) Lab Results  Component Value Date   CRP 1.7 08/03/2014   ESRSEDRATE 2 08/03/2014                         Rheumatology Markers  Lab Results  Component Value Date   RF 12 06/24/2013   ANA Negative 08/03/2014                        Renal Function Markers Lab Results  Component Value Date   BUN 16 01/09/2018   CREATININE 0.70 01/09/2018   BCR 23 01/09/2018   GFRAA 114 01/09/2018   GFRNONAA 99 01/09/2018                             Hepatic Function Markers Lab Results  Component Value Date   AST 26 03/15/2017   ALT 26 03/15/2017   ALBUMIN 4.3 03/15/2017   ALKPHOS 117 03/15/2017   LIPASE 25 01/12/2016                        Electrolytes Lab Results  Component Value Date   NA 142 01/09/2018   K 4.5 01/09/2018   CL 103 01/09/2018   CALCIUM 9.2 01/09/2018                        Neuropathy Markers Lab Results  Component Value Date   HGBA1C 5.6 07/22/2014                        CNS Tests No results found for: COLORCSF, APPEARCSF, RBCCOUNTCSF, WBCCSF, POLYSCSF, LYMPHSCSF, EOSCSF, PROTEINCSF, GLUCCSF, JCVIRUS, CSFOLI, IGGCSF                      Bone Pathology Markers Lab Results  Component Value Date   VD25OH 31 01/07/2013                          Coagulation Parameters Lab Results  Component Value Date   INR 1.04 02/16/2013   LABPROT 13.6 02/16/2013   APTT 29 02/16/2013   PLT 274 01/12/2016                        Cardiovascular Markers Lab Results  Component Value Date   CKTOTAL 139 08/03/2014   CKMB 2.8 06/24/2013   HGB 14.6 01/12/2016   HCT 43.4 01/12/2016                         CA Markers No results found for: CEA, CA125, LABCA2                      Note: Lab results reviewed.  Recent Diagnostic Imaging Results  DG Wrist Complete Left CLINICAL DATA:  52 year old female. No injury. Increasing left wrist pain around distal ulna. Initial encounter.  EXAM: LEFT WRIST - COMPLETE 3+ VIEW  COMPARISON:  01/30/2009 left thumb MR.  FINDINGS: Mild degenerative changes distal radial lunate articulation. Slight increase size of interspace between the lunate and scaphoid may indicate ligamentous laxity and/or result of remote ligamentous injury. Minimal spur triquetrum. Moderate left first metacarpal phalangeal joint degenerative changes. No discrete osseous abnormality of the distal ulnar. No fracture or dislocation.  IMPRESSION: 1. Mild degenerative changes distal radial lunate articulation. 2. Slight increase size of interspace between the lunate and scaphoid may indicate ligamentous laxity and/or result of remote ligamentous injury. 3. Minimal spur triquetrum. 4. Moderate left first metacarpal phalangeal joint degenerative changes. 5. No discrete osseous  abnormality of the distal ulnar.  Electronically Signed   By: Genia Del M.D.   On: 01/12/2018 08:37  Complexity Note: Imaging results reviewed. Results shared with Ms. Conley Canal, using Layman's terms.                         Meds   Current Outpatient Medications:  .  amphetamine-dextroamphetamine (ADDERALL XR) 30 MG 24 hr capsule, 2 qam, Disp: 60 capsule, Rfl: 0 .  amphetamine-dextroamphetamine (ADDERALL XR) 30 MG 24 hr capsule, 2 q am,  Disp: 60 capsule, Rfl: 0 .  amphetamine-dextroamphetamine (ADDERALL XR) 30 MG 24 hr capsule, 2 q am, Disp: 60 capsule, Rfl: 0 .  cephALEXin (KEFLEX) 500 MG capsule, Take 1 capsule (500 mg total) by mouth 4 (four) times daily., Disp: 28 capsule, Rfl: 0 .  clobetasol ointment (TEMOVATE) 3.42 %, Apply 1 application topically as needed. , Disp: , Rfl:  .  diazepam (VALIUM) 2 MG tablet, Take 1 tablet (2 mg total) by mouth daily as needed for anxiety., Disp: 30 tablet, Rfl: 0 .  Diclofenac Sodium CR 100 MG 24 hr tablet, TAKE 1 TABLET BY MOUTH TWICE DAILY. TAKE WITH FOOD., Disp: 60 tablet, Rfl: 5 .  DULoxetine (CYMBALTA) 60 MG capsule, Take 2 capsules (120 mg total) by mouth daily., Disp: 180 capsule, Rfl: 0 .  lidocaine (XYLOCAINE) 2 % jelly, Apply 1 application topically 2 (two) times daily., Disp: 4 mL, Rfl: 1 .  lidocaine (XYLOCAINE) 2 % solution, TAKE 5 TO 10 MLS BY MOUTH TWICE DAILY AS NEEDED., Disp: 100 mL, Rfl: 0 .  methocarbamol (ROBAXIN) 750 MG tablet, Take 1 tablet (750 mg total) by mouth every 8 (eight) hours as needed for muscle spasms., Disp: 90 tablet, Rfl: 4 .  nystatin (MYCOSTATIN/NYSTOP) powder, APPLY TO AFFECTED AREA(S) TOPICALLY TWICE DAILY., Disp: 45 g, Rfl: 6 .  omeprazole (PRILOSEC) 40 MG capsule, TAKE (1) CAPSULE BY MOUTH ONCE DAILY., Disp: 30 capsule, Rfl: 12 .  oxyCODONE-acetaminophen (PERCOCET) 10-325 MG tablet, 1-2 tablets TID prn For chronic pain To last for 30 days from fill date MAX 105/month, Disp: 105 tablet, Rfl: 0 .  traZODone (DESYREL) 100 MG tablet, 100-200 mg at night as needed for sleep, Disp: 60 tablet, Rfl: 1 .  naloxegol oxalate (MOVANTIK) 25 MG TABS tablet, Take by mouth., Disp: , Rfl:   ROS  Constitutional: Denies any fever or chills Gastrointestinal: No reported hemesis, hematochezia, vomiting, or acute GI distress Musculoskeletal: Denies any acute onset joint swelling, redness, loss of ROM, or weakness Neurological: No reported episodes of acute onset  apraxia, aphasia, dysarthria, agnosia, amnesia, paralysis, loss of coordination, or loss of consciousness  Allergies  Ms. Bassett is allergic to wellbutrin [bupropion] and lyrica [pregabalin].  [place]  Drug: Ms. Scali  reports that she does not use drugs. Alcohol:  reports that she drinks alcohol. Tobacco:  reports that she quit smoking about 17 years ago. Her smoking use included cigarettes. She quit after 10.00 years of use. She has never used smokeless tobacco. Medical:  has a past medical history of ADHD (attention deficit hyperactivity disorder), Arthritis, Back pain, Broken femur ([place]) (08/27/12), Migraine, and Osteoarthritis. Surgical: Ms. Yoshino  has a past surgical history that includes Total knee arthroplasty; Heel spur surgery; Back surgery; IM nailing femoral shaft fracture (Left, 08/27/2012); Femur IM nail (Left, 08/27/2012); Cervical biopsy w/ loop electrode excision (1990's); Endometrial biopsy (04-05-05 and 02-12-06); Tubal ligation; Shoulder surgery (Left); and Vaginal hysterectomy. Family: family history includes Bipolar  disorder in her mother and sister; Breast cancer in her mother; Cancer in her father, mother, and other; Heart failure in her father and other.  Constitutional Exam  General appearance: Well nourished, well developed, and well hydrated. In no apparent acute distress Vitals:   01/21/18 1502  BP: (!) 131/91  Pulse: 65  Resp: 16  Temp: 97.9 F (36.6 C)  TempSrc: Oral  SpO2: 96%  Weight: 243 lb (110.2 kg)  Height: 5' 7" (1.702 m)  Psych/Mental status: Alert, oriented x 3 (person, place, & time)       Eyes: PERLA Respiratory: No evidence of acute respiratory distress  Lumbar Spine Area Exam  Skin & Axial Inspection: No masses, redness, or swelling Alignment: Symmetrical Functional ROM: Unrestricted ROM       Stability: No instability detected Muscle Tone/Strength: Functionally intact. No obvious neuro-muscular anomalies detected. Sensory  (Neurological): Unimpaired Palpation: Complains of area being tender to palpation       Provocative Tests: Hyperextension/rotation test: (-)       Lumbar quadrant test (Kemp's test): deferred today       Lateral bending test: (-)       Patrick's Maneuver: deferred today                   FABER test: deferred today                   S-I anterior distraction/compression test: deferred today         S-I lateral compression test: deferred today         S-I Thigh-thrust test: deferred today         S-I Gaenslen's test: deferred today          Gait & Posture Assessment  Ambulation: Unassisted Gait: Relatively normal for age and body habitus Posture: WNL   Lower Extremity Exam    Side: Right lower extremity  Side: Left lower extremity  Stability: No instability observed          Stability: No instability observed          Skin & Extremity Inspection: Skin color, temperature, and hair growth are WNL. No peripheral edema or cyanosis. No masses, redness, swelling, asymmetry, or associated skin lesions. No contractures.  Skin & Extremity Inspection: Skin color, temperature, and hair growth are WNL. No peripheral edema or cyanosis. No masses, redness, swelling, asymmetry, or associated skin lesions. No contractures.  Functional ROM: Unrestricted ROM                  Functional ROM: Unrestricted ROM                  Muscle Tone/Strength: Functionally intact. No obvious neuro-muscular anomalies detected.  Muscle Tone/Strength: Functionally intact. No obvious neuro-muscular anomalies detected.  Sensory (Neurological): Unimpaired  Sensory (Neurological): Unimpaired  Palpation: No palpable anomalies  Palpation: No palpable anomalies   Assessment  Primary Diagnosis & Pertinent Problem List: The primary encounter diagnosis was Lumbar spondylosis. Diagnoses of Fibromyalgia, Chronic pain syndrome, Lumbar degenerative disc disease, and Long term prescription opiate use were also pertinent to this  visit.  Status Diagnosis  Persistent Persistent Controlled 1. Lumbar spondylosis   2. Fibromyalgia   3. Chronic pain syndrome   4. Lumbar degenerative disc disease   5. Long term prescription opiate use     Problems updated and reviewed during this visit: No problems updated. Plan of Care  Pharmacotherapy (Medications Ordered): No orders of the defined types were  placed in this encounter.  New Prescriptions   No medications on file   Medications administered today: Kadiatou Oplinger. Oddo had no medications administered during this visit. Lab-work, procedure(s), and/or referral(s): Orders Placed This Encounter  Procedures  . ToxASSURE Select 13 (MW), Urine   Imaging and/or referral(s): None  Interventional therapies: Planned, scheduled, and/or pending:   Not at this time.  Medication on hold onto UDS is completed secondary to patient not bringing her medication per controlled substance policy.   Provider-requested follow-up: Return in about 3 months (around 04/23/2018) for MedMgmt.  Future Appointments  Date Time Provider [place]  02/02/2018  3:00 PM Norman Clay, MD [facility] None  04/06/2018  3:00 PM Lauren Drown, MD [facility]  04/21/2018 12:15 PM Gillis Santa, MD [facility] None  06/23/2018  2:00 PM Megan Salon, MD [place] None   Primary Care Physician: Lauren Drown, MD Location: [facility] Out[facility] Management Facility Note by: Vevelyn Francois NP Date: 01/21/2018; Time: 5:09 PM  Pain Score Disclaimer: We use the NRS-11 scale. This is a self-reported, subjective measurement of pain severity with only modest accuracy. It is used primarily to identify changes within a particular patient. It must be understood that outpatient pain scales are significantly less accurate that those used for research, where they can be applied under ideal controlled circumstances with minimal exposure to variables. In reality, the score is likely to be a combination of pain  intensity and pain affect, where pain affect describes the degree of emotional arousal or changes in action readiness caused by the sensory experience of pain. Factors such as social and work situation, setting, emotional state, anxiety levels, expectation, and prior pain experience may influence pain perception and show large inter-individual differences that may also be affected by time variables.  Patient instructions provided during this appointment: Patient Instructions   ____________________________________________________________________________________________  Medication Rules  Applies to: All patients receiving prescriptions (written or electronic).  Pharmacy of record: Pharmacy where electronic prescriptions will be sent. If written prescriptions are taken to a different pharmacy, please inform the nursing staff. The pharmacy listed in the electronic medical record should be the one where you would like electronic prescriptions to be sent.  Prescription refills: Only during scheduled appointments. Applies to both, written and electronic prescriptions.  NOTE: The following applies primarily to controlled substances (Opioid* Pain Medications).   Patient's responsibilities: 1. Pain Pills: Bring all pain pills to every appointment (except for procedure appointments). 2. Pill Bottles: Bring pills in original pharmacy bottle. Always bring newest bottle. Bring bottle, even if empty. 3. Medication refills: You are responsible for knowing and keeping track of what medications you need refilled. The day before your appointment, write a list of all prescriptions that need to be refilled. Bring that list to your appointment and give it to the admitting nurse. Prescriptions will be written only during appointments. If you forget a medication, it will not be "Called in", "Faxed", or "electronically sent". You will need to get another appointment to get these prescribed. 4. Prescription Accuracy:  You are responsible for carefully inspecting your prescriptions before leaving our office. Have the discharge nurse carefully go over each prescription with you, before taking them home. Make sure that your name is accurately spelled, that your address is correct. Check the name and dose of your medication to make sure it is accurate. Check the number of pills, and the written instructions to make sure they are clear and accurate. Make sure that you are  given enough medication to last until your next medication refill appointment. 5. Taking Medication: Take medication as prescribed. Never take more pills than instructed. Never take medication more frequently than prescribed. Taking less pills or less frequently is permitted and encouraged, when it comes to controlled substances (written prescriptions).  6. Inform other Doctors: Always inform, all of your healthcare providers, of all the medications you take. 7. Pain Medication from other Providers: You are not allowed to accept any additional pain medication from any other Doctor or Healthcare provider. There are two exceptions to this rule. (see below) In the event that you require additional pain medication, you are responsible for notifying us, as stated below. 8. Medication Agreement: You are responsible for carefully reading and following our Medication Agreement. This must be signed before receiving any prescriptions from our practice. Safely store a copy of your signed Agreement. Violations to the Agreement will result in no further prescriptions. (Additional copies of our Medication Agreement are available upon request.) 9. Laws, Rules, & Regulations: All patients are expected to follow all Federal and [employer], [employer], Rules, [employer]. Ignorance of the Laws does not constitute a valid excuse. The use of any illegal substances is prohibited. 10. Adopted CDC guidelines & recommendations: Target dosing levels will be at or below 60 MME/day. Use  of benzodiazepines** is not recommended.  Exceptions: There are only two exceptions to the rule of not receiving pain medications from other Healthcare Providers. 1. Exception #1 (Emergencies): In the event of an emergency (i.e.: accident requiring emergency care), you are allowed to receive additional pain medication. However, you are responsible for: As soon as you are able, call our office [phone], at any time of the day or night, and leave a message stating your name, the date and nature of the emergency, and the name and dose of the medication prescribed. In the event that your call is answered by a member of our staff, make sure to document and save the date, time, and the name of the person that took your information.  2. Exception #2 (Planned Surgery): In the event that you are scheduled by another doctor or dentist to have any type of surgery or procedure, you are allowed (for a period no longer than 30 days), to receive additional pain medication, for the acute post-op pain. However, in this case, you are responsible for picking up a copy of our "Post-op Pain Management for Surgeons" handout, and giving it to your surgeon or dentist. This document is available at our office, and does not require an appointment to obtain it. Simply go to our office during business hours (Monday-Thursday from 8:00 AM to 4:00 PM) (Friday 8:00 AM to 12:00 Noon) or if you have a scheduled appointment with [place], prior to your surgery, and ask for it by name. In addition, you will need to provide [place] with your name, name of your surgeon, type of surgery, and date of procedure or surgery.  *Opioid medications include: morphine, codeine, oxycodone, oxymorphone, hydrocodone, hydromorphone, meperidine, tramadol, tapentadol, buprenorphine, fentanyl, methadone. **Benzodiazepine medications include: diazepam (Valium), alprazolam (Xanax), clonazepam (Klonopine), lorazepam (Ativan), clorazepate (Tranxene), chlordiazepoxide  (Librium), estazolam (Prosom), oxazepam (Serax), temazepam (Restoril), triazolam (Halcion) (Last updated: 05/15/2017) ____________________________________________________________________________________________   ____________________________________________________________________________________________  Appointment Policy Summary  It is our goal and responsibility to provide the medical community with assistance in the evaluation and management of patients with chronic pain. Unfortunately our resources are limited. Because we do not have an unlimited amount of time, or available  appointments, we are required to closely monitor and manage their use. The following rules exist to maximize their use:  Patient's responsibilities: 1. Punctuality:  At what time should I arrive? You should be physically present in our office 30 minutes before your scheduled appointment. Your scheduled appointment is with your assigned healthcare provider. However, it takes 5-10 minutes to be "checked-in", and another 15 minutes for the nurses to do the admission. If you arrive to our office at the time you were given for your appointment, you will end up being at least 20-25 minutes late to your appointment with the provider. 2. Tardiness:  What happens if I arrive only a few minutes after my scheduled appointment time? You will need to reschedule your appointment. The cutoff is your appointment time. This is why it is so important that you arrive at least 30 minutes before that appointment. If you have an appointment scheduled for 10:00 AM and you arrive at 10:01, you will be required to reschedule your appointment.  3. Plan ahead:  Always assume that you will encounter traffic on your way in. Plan for it. If you are dependent on a driver, make sure they understand these rules and the need to arrive early. 4. Other appointments and responsibilities:  Avoid scheduling any other appointments before or after your pain  clinic appointments.  5. Be prepared:  Write down everything that you need to discuss with your healthcare provider and give this information to the admitting nurse. Write down the medications that you will need refilled. Bring your pills and bottles (even the empty ones), to all of your appointments, except for those where a procedure is scheduled. 6. No children or pets:  Find someone to take care of them. It is not appropriate to bring them in. 7. Scheduling changes:  We request "advanced notification" of any changes or cancellations. 8. Advanced notification:  Defined as a time period of more than 24 hours prior to the originally scheduled appointment. This allows for the appointment to be offered to other patients. 9. Rescheduling:  When a visit is rescheduled, it will require the cancellation of the original appointment. For this reason they both fall within the category of "Cancellations".  10. Cancellations:  They require advanced notification. Any cancellation less than 24 hours before the  appointment will be recorded as a "No Show". 11. No Show:  Defined as an unkept appointment where the patient failed to notify or declare to the practice their intention or inability to keep the appointment.  Corrective process for repeat offenders:  1. Tardiness: Three (3) episodes of rescheduling due to late arrivals will be recorded as one (1) "No Show". 2. Cancellation or reschedule: Three (3) cancellations or rescheduling will be recorded as one (1) "No Show". 3. "No Shows": Three (3) "No Shows" within a 12 month period will result in discharge from the practice. ____________________________________________________________________________________________   BMI Assessment: Estimated body mass index is 38.06 kg/m as calculated from the following:   Height as of this encounter: 5' 7" (1.702 m).   Weight as of this encounter: 243 lb (110.2 kg).  BMI interpretation table: BMI level Category  Range association with higher incidence of chronic pain  <18 kg/m2 Underweight   18.5-24.9 kg/m2 Ideal body weight   25-29.9 kg/m2 Overweight Increased incidence by 20%  30-34.9 kg/m2 Obese (Class I) Increased incidence by 68%  35-39.9 kg/m2 Severe obesity (Class II) Increased incidence by 136%  >40 kg/m2 Extreme obesity (Class III) Increased incidence by  254%   Patient's current BMI Ideal Body weight  Body mass index is 38.06 kg/m. Ideal body weight: 61.6 kg (135 lb 12.9 oz) Adjusted ideal body weight: 81 kg (178 lb 10.9 oz)   BMI Readings from Last 4 Encounters:  01/21/18 38.06 kg/m  01/09/18 37.71 kg/m  12/01/17 36.49 kg/m  10/22/17 36.19 kg/m   Wt Readings from Last 4 Encounters:  01/21/18 243 lb (110.2 kg)  01/09/18 248 lb (112.5 kg)  12/01/17 240 lb (108.9 kg)  10/22/17 238 lb (108 kg)  ____________________________________________________________________________________________  Pain Scale  Introduction: The pain score used by this practice is the Verbal Numerical Rating Scale (VNRS-11). This is an 11-point scale. It is for adults and children 10 years or older. There are significant differences in how the pain score is reported, used, and applied. Forget everything you learned in the past and learn this scoring system.  General Information: The scale should reflect your current level of pain. Unless you are specifically asked for the level of your worst pain, or your average pain. If you are asked for one of these two, then it should be understood that it is over the past 24 hours.  Basic Activities of Daily Living (ADL): Personal hygiene, dressing, eating, transferring, and using restroom.  Instructions: Most patients tend to report their level of pain as a combination of two factors, their physical pain and their psychosocial pain. This last one is also known as "suffering" and it is reflection of how physical pain affects you socially and psychologically. From now on,  report them separately. From this point on, when asked to report your pain level, report only your physical pain. Use the following table for reference.  Pain Clinic Pain Levels (0-5/10)  Pain Level Score  Description  No Pain 0   Mild pain 1 Nagging, annoying, but does not interfere with basic activities of daily living (ADL). Patients are able to eat, bathe, get dressed, toileting (being able to get on and off the toilet and perform personal hygiene functions), transfer (move in and out of bed or a chair without assistance), and maintain continence (able to control bladder and bowel functions). Blood pressure and heart rate are unaffected. A normal heart rate for a healthy adult ranges from 60 to 100 bpm (beats per minute).   Mild to moderate pain 2 Noticeable and distracting. Impossible to hide from other people. More frequent flare-ups. Still possible to adapt and function close to normal. It can be very annoying and may have occasional stronger flare-ups. With discipline, patients may get used to it and adapt.   Moderate pain 3 Interferes significantly with activities of daily living (ADL). It becomes difficult to feed, bathe, get dressed, get on and off the toilet or to perform personal hygiene functions. Difficult to get in and out of bed or a chair without assistance. Very distracting. With effort, it can be ignored when deeply involved in activities.   Moderately severe pain 4 Impossible to ignore for more than a few minutes. With effort, patients may still be able to manage work or participate in some social activities. Very difficult to concentrate. Signs of autonomic nervous system discharge are evident: dilated pupils (mydriasis); mild sweating (diaphoresis); sleep interference. Heart rate becomes elevated (>115 bpm). Diastolic blood pressure (lower number) rises above 100 mmHg. Patients find relief in laying down and not moving.   Severe pain 5 Intense and extremely unpleasant. Associated  with frowning face and frequent crying. Pain overwhelms the senses.  Ability to do any activity or maintain social relationships becomes significantly limited. Conversation becomes difficult. Pacing back and forth is common, as getting into a comfortable position is nearly impossible. Pain wakes you up from deep sleep. Physical signs will be obvious: pupillary dilation; increased sweating; goosebumps; brisk reflexes; cold, clammy hands and feet; nausea, vomiting or dry heaves; loss of appetite; significant sleep disturbance with inability to fall asleep or to remain asleep. When persistent, significant weight loss is observed due to the complete loss of appetite and sleep deprivation.  Blood pressure and heart rate becomes significantly elevated. Caution: If elevated blood pressure triggers a pounding headache associated with blurred vision, then the patient should immediately seek attention at an urgent or emergency care unit, as these may be signs of an impending stroke.    Emergency Department Pain Levels (6-10/10)  Emergency Room Pain 6 Severely limiting. Requires emergency care and should not be seen or managed at an outpatient pain management facility. Communication becomes difficult and requires great effort. Assistance to reach the emergency department may be required. Facial flushing and profuse sweating along with potentially dangerous increases in heart rate and blood pressure will be evident.   Distressing pain 7 Self-care is very difficult. Assistance is required to transport, or use restroom. Assistance to reach the emergency department will be required. Tasks requiring coordination, such as bathing and getting dressed become very difficult.   Disabling pain 8 Self-care is no longer possible. At this level, pain is disabling. The individual is unable to do even the most "basic" activities such as walking, eating, bathing, dressing, transferring to a bed, or toileting. Fine motor skills are  lost. It is difficult to think clearly.   Incapacitating pain 9 Pain becomes incapacitating. Thought processing is no longer possible. Difficult to remember your own name. Control of movement and coordination are lost.   The worst pain imaginable 10 At this level, most patients pass out from pain. When this level is reached, collapse of the autonomic nervous system occurs, leading to a sudden drop in blood pressure and heart rate. This in turn results in a temporary and dramatic drop in blood flow to the brain, leading to a loss of consciousness. Fainting is one of the body's self defense mechanisms. Passing out puts the brain in a calmed state and causes it to shut down for a while, in order to begin the healing process.    Summary: 1. Refer to this scale when providing [place] with your pain level. 2. Be accurate and careful when reporting your pain level. This will help with your care. 3. Over-reporting your pain level will lead to loss of credibility. 4. Even a level of 1/10 means that there is pain and will be treated at our facility. 5. High, inaccurate reporting will be documented as "Symptom Exaggeration", leading to loss of credibility and suspicions of possible secondary gains such as obtaining more narcotics, or wanting to appear disabled, for fraudulent reasons. 6. Only pain levels of 5 or below will be seen at our facility. 7. Pain levels of 6 and above will be sent to the Emergency Department and the appointment cancelled. ____________________________________________________________________________________________   ____________________________________________________________________________________________  CANNABIDIOL (AKA: CBD Oil or Pills)  Applies to: All patients receiving prescriptions of controlled substances (written and/or electronic).  General Information: Cannabidiol (CBD) was discovered in [birth year]. It is one of some 113 identified cannabinoids in cannabis (Marijuana)  plants, accounting for up to 40% of the plant's extract. As of 2018, preliminary clinical  research on cannabidiol included studies of anxiety, cognition, movement disorders, and pain.  Cannabidiol is consummed in multiple ways, including inhalation of cannabis smoke or vapor, as an aerosol spray into the cheek, and by mouth. It may be supplied as CBD oil containing CBD as the active ingredient (no added tetrahydrocannabinol (THC) or terpenes), a full-plant CBD-dominant hemp extract oil, capsules, dried cannabis, or as a liquid solution. CBD is thought not have the same psychoactivity as THC, and may affect the actions of THC. Studies suggest that CBD may interact with different biological targets, including cannabinoid receptors and other neurotransmitter receptors. As of 2018 the mechanism of action for its biological effects has not been determined.  In the [place], cannabidiol has a limited approval by the Food and Drug Administration (FDA) for treatment of only two types of epilepsy disorders. The side effects of long-term use of the drug include somnolence, decreased appetite, diarrhea, fatigue, malaise, weakness, sleeping problems, and others.  CBD remains a Schedule I drug prohibited for any use.  Legality: Some manufacturers ship CBD products nationally, an illegal action which the FDA has not enforced in 2018, with CBD remaining the subject of an FDA investigational new drug evaluation, and is not considered legal as a dietary supplement or food ingredient as of December 2018. Federal illegality has made it difficult historically to conduct research on CBD. CBD is openly sold in head shops and health food stores in some states where such sales have not been explicitly legalized.  Warning: Because it is not FDA approved for general use or treatment of pain, it is not required to undergo the same manufacturing controls as prescription drugs.  This means that the available cannabidiol (CBD) may  be contaminated with THC.  If this is the case, it will trigger a positive urine drug screen (UDS) test for cannabinoids (Marijuana).  Because a positive UDS for illicit substances is a violation of our medication agreement, your opioid analgesics (pain medicine) may be permanently discontinued. (Last update: 06/05/2017) ____________________________________________________________________________________________

## 2018-01-21 NOTE — Patient Instructions (Addendum)
____________________________________________________________________________________________  Medication Rules  Applies to: All patients receiving prescriptions (written or electronic).  Pharmacy of record: Pharmacy where electronic prescriptions will be sent. If written prescriptions are taken to a different pharmacy, please inform the nursing staff. The pharmacy listed in the electronic medical record should be the one where you would like electronic prescriptions to be sent.  Prescription refills: Only during scheduled appointments. Applies to both, written and electronic prescriptions.  NOTE: The following applies primarily to controlled substances (Opioid* Pain Medications).   Patient's responsibilities: 1. Pain Pills: Bring all pain pills to every appointment (except for procedure appointments). 2. Pill Bottles: Bring pills in original pharmacy bottle. Always bring newest bottle. Bring bottle, even if empty. 3. Medication refills: You are responsible for knowing and keeping track of what medications you need refilled. The day before your appointment, write a list of all prescriptions that need to be refilled. Bring that list to your appointment and give it to the admitting nurse. Prescriptions will be written only during appointments. If you forget a medication, it will not be "Called in", "Faxed", or "electronically sent". You will need to get another appointment to get these prescribed. 4. Prescription Accuracy: You are responsible for carefully inspecting your prescriptions before leaving our office. Have the discharge nurse carefully go over each prescription with you, before taking them home. Make sure that your name is accurately spelled, that your address is correct. Check the name and dose of your medication to make sure it is accurate. Check the number of pills, and the written instructions to make sure they are clear and accurate. Make sure that you are given enough medication to last  until your next medication refill appointment. 5. Taking Medication: Take medication as prescribed. Never take more pills than instructed. Never take medication more frequently than prescribed. Taking less pills or less frequently is permitted and encouraged, when it comes to controlled substances (written prescriptions).  6. Inform other Doctors: Always inform, all of your healthcare providers, of all the medications you take. 7. Pain Medication from other Providers: You are not allowed to accept any additional pain medication from any other Doctor or Healthcare provider. There are two exceptions to this rule. (see below) In the event that you require additional pain medication, you are responsible for notifying us, as stated below. 8. Medication Agreement: You are responsible for carefully reading and following our Medication Agreement. This must be signed before receiving any prescriptions from our practice. Safely store a copy of your signed Agreement. Violations to the Agreement will result in no further prescriptions. (Additional copies of our Medication Agreement are available upon request.) 9. Laws, Rules, & Regulations: All patients are expected to follow all Federal and State Laws, Statutes, Rules, & Regulations. Ignorance of the Laws does not constitute a valid excuse. The use of any illegal substances is prohibited. 10. Adopted CDC guidelines & recommendations: Target dosing levels will be at or below 60 MME/day. Use of benzodiazepines** is not recommended.  Exceptions: There are only two exceptions to the rule of not receiving pain medications from other Healthcare Providers. 1. Exception #1 (Emergencies): In the event of an emergency (i.e.: accident requiring emergency care), you are allowed to receive additional pain medication. However, you are responsible for: As soon as you are able, call our office [phone], at any time of the day or night, and leave a message stating your name, the  date and nature of the emergency, and the name and dose of the medication   prescribed. In the event that your call is answered by a member of our staff, make sure to document and save the date, time, and the name of the person that took your information.  2. Exception #2 (Planned Surgery): In the event that you are scheduled by another doctor or dentist to have any type of surgery or procedure, you are allowed (for a period no longer than 30 days), to receive additional pain medication, for the acute post-op pain. However, in this case, you are responsible for picking up a copy of our "Post-op Pain Management for Surgeons" handout, and giving it to your surgeon or dentist. This document is available at our office, and does not require an appointment to obtain it. Simply go to our office during business hours (Monday-Thursday from 8:00 AM to 4:00 PM) (Friday 8:00 AM to 12:00 Noon) or if you have a scheduled appointment with us, prior to your surgery, and ask for it by name. In addition, you will need to provide us with your name, name of your surgeon, type of surgery, and date of procedure or surgery.  *Opioid medications include: morphine, codeine, oxycodone, oxymorphone, hydrocodone, hydromorphone, meperidine, tramadol, tapentadol, buprenorphine, fentanyl, methadone. **Benzodiazepine medications include: diazepam (Valium), alprazolam (Xanax), clonazepam (Klonopine), lorazepam (Ativan), clorazepate (Tranxene), chlordiazepoxide (Librium), estazolam (Prosom), oxazepam (Serax), temazepam (Restoril), triazolam (Halcion) (Last updated: 05/15/2017) ____________________________________________________________________________________________   ____________________________________________________________________________________________  Appointment Policy Summary  It is our goal and responsibility to provide the medical community with assistance in the evaluation and management of patients with chronic pain.  Unfortunately our resources are limited. Because we do not have an unlimited amount of time, or available appointments, we are required to closely monitor and manage their use. The following rules exist to maximize their use:  Patient's responsibilities: 1. Punctuality:  At what time should I arrive? You should be physically present in our office 30 minutes before your scheduled appointment. Your scheduled appointment is with your assigned healthcare provider. However, it takes 5-10 minutes to be "checked-in", and another 15 minutes for the nurses to do the admission. If you arrive to our office at the time you were given for your appointment, you will end up being at least 20-25 minutes late to your appointment with the provider. 2. Tardiness:  What happens if I arrive only a few minutes after my scheduled appointment time? You will need to reschedule your appointment. The cutoff is your appointment time. This is why it is so important that you arrive at least 30 minutes before that appointment. If you have an appointment scheduled for 10:00 AM and you arrive at 10:01, you will be required to reschedule your appointment.  3. Plan ahead:  Always assume that you will encounter traffic on your way in. Plan for it. If you are dependent on a driver, make sure they understand these rules and the need to arrive early. 4. Other appointments and responsibilities:  Avoid scheduling any other appointments before or after your pain clinic appointments.  5. Be prepared:  Write down everything that you need to discuss with your healthcare provider and give this information to the admitting nurse. Write down the medications that you will need refilled. Bring your pills and bottles (even the empty ones), to all of your appointments, except for those where a procedure is scheduled. 6. No children or pets:  Find someone to take care of them. It is not appropriate to bring them in. 7. Scheduling changes:  We request  "advanced notification" of any changes or   cancellations. 8. Advanced notification:  Defined as a time period of more than 24 hours prior to the originally scheduled appointment. This allows for the appointment to be offered to other patients. 9. Rescheduling:  When a visit is rescheduled, it will require the cancellation of the original appointment. For this reason they both fall within the category of "Cancellations".  10. Cancellations:  They require advanced notification. Any cancellation less than 24 hours before the  appointment will be recorded as a "No Show". 11. No Show:  Defined as an unkept appointment where the patient failed to notify or declare to the practice their intention or inability to keep the appointment.  Corrective process for repeat offenders:  1. Tardiness: Three (3) episodes of rescheduling due to late arrivals will be recorded as one (1) "No Show". 2. Cancellation or reschedule: Three (3) cancellations or rescheduling will be recorded as one (1) "No Show". 3. "No Shows": Three (3) "No Shows" within a 12 month period will result in discharge from the practice. ____________________________________________________________________________________________   BMI Assessment: Estimated body mass index is 38.06 kg/m as calculated from the following:   Height as of this encounter: 5\' 7"  (1.702 m).   Weight as of this encounter: 243 lb (110.2 kg).  BMI interpretation table: BMI level Category Range association with higher incidence of chronic pain  <18 kg/m2 Underweight   18.5-24.9 kg/m2 Ideal body weight   25-29.9 kg/m2 Overweight Increased incidence by 20%  30-34.9 kg/m2 Obese (Class I) Increased incidence by 68%  35-39.9 kg/m2 Severe obesity (Class II) Increased incidence by 136%  >40 kg/m2 Extreme obesity (Class III) Increased incidence by 254%   Patient's current BMI Ideal Body weight  Body mass index is 38.06 kg/m. Ideal body weight: 61.6 kg (135 lb 12.9  oz) Adjusted ideal body weight: 81 kg (178 lb 10.9 oz)   BMI Readings from Last 4 Encounters:  01/21/18 38.06 kg/m  01/09/18 37.71 kg/m  12/01/17 36.49 kg/m  10/22/17 36.19 kg/m   Wt Readings from Last 4 Encounters:  01/21/18 243 lb (110.2 kg)  01/09/18 248 lb (112.5 kg)  12/01/17 240 lb (108.9 kg)  10/22/17 238 lb (108 kg)  ____________________________________________________________________________________________  Pain Scale  Introduction: The pain score used by this practice is the Verbal Numerical Rating Scale (VNRS-11). This is an 11-point scale. It is for adults and children 10 years or older. There are significant differences in how the pain score is reported, used, and applied. Forget everything you learned in the past and learn this scoring system.  General Information: The scale should reflect your current level of pain. Unless you are specifically asked for the level of your worst pain, or your average pain. If you are asked for one of these two, then it should be understood that it is over the past 24 hours.  Basic Activities of Daily Living (ADL): Personal hygiene, dressing, eating, transferring, and using restroom.  Instructions: Most patients tend to report their level of pain as a combination of two factors, their physical pain and their psychosocial pain. This last one is also known as "suffering" and it is reflection of how physical pain affects you socially and psychologically. From now on, report them separately. From this point on, when asked to report your pain level, report only your physical pain. Use the following table for reference.  Pain Clinic Pain Levels (0-5/10)  Pain Level Score  Description  No Pain 0   Mild pain 1 Nagging, annoying, but does not interfere with  basic activities of daily living (ADL). Patients are able to eat, bathe, get dressed, toileting (being able to get on and off the toilet and perform personal hygiene functions), transfer (move  in and out of bed or a chair without assistance), and maintain continence (able to control bladder and bowel functions). Blood pressure and heart rate are unaffected. A normal heart rate for a healthy adult ranges from 60 to 100 bpm (beats per minute).   Mild to moderate pain 2 Noticeable and distracting. Impossible to hide from other people. More frequent flare-ups. Still possible to adapt and function close to normal. It can be very annoying and may have occasional stronger flare-ups. With discipline, patients may get used to it and adapt.   Moderate pain 3 Interferes significantly with activities of daily living (ADL). It becomes difficult to feed, bathe, get dressed, get on and off the toilet or to perform personal hygiene functions. Difficult to get in and out of bed or a chair without assistance. Very distracting. With effort, it can be ignored when deeply involved in activities.   Moderately severe pain 4 Impossible to ignore for more than a few minutes. With effort, patients may still be able to manage work or participate in some social activities. Very difficult to concentrate. Signs of autonomic nervous system discharge are evident: dilated pupils (mydriasis); mild sweating (diaphoresis); sleep interference. Heart rate becomes elevated (>115 bpm). Diastolic blood pressure (lower number) rises above 100 mmHg. Patients find relief in laying down and not moving.   Severe pain 5 Intense and extremely unpleasant. Associated with frowning face and frequent crying. Pain overwhelms the senses.  Ability to do any activity or maintain social relationships becomes significantly limited. Conversation becomes difficult. Pacing back and forth is common, as getting into a comfortable position is nearly impossible. Pain wakes you up from deep sleep. Physical signs will be obvious: pupillary dilation; increased sweating; goosebumps; brisk reflexes; cold, clammy hands and feet; nausea, vomiting or dry heaves; loss  of appetite; significant sleep disturbance with inability to fall asleep or to remain asleep. When persistent, significant weight loss is observed due to the complete loss of appetite and sleep deprivation.  Blood pressure and heart rate becomes significantly elevated. Caution: If elevated blood pressure triggers a pounding headache associated with blurred vision, then the patient should immediately seek attention at an urgent or emergency care unit, as these may be signs of an impending stroke.    Emergency Department Pain Levels (6-10/10)  Emergency Room Pain 6 Severely limiting. Requires emergency care and should not be seen or managed at an outpatient pain management facility. Communication becomes difficult and requires great effort. Assistance to reach the emergency department may be required. Facial flushing and profuse sweating along with potentially dangerous increases in heart rate and blood pressure will be evident.   Distressing pain 7 Self-care is very difficult. Assistance is required to transport, or use restroom. Assistance to reach the emergency department will be required. Tasks requiring coordination, such as bathing and getting dressed become very difficult.   Disabling pain 8 Self-care is no longer possible. At this level, pain is disabling. The individual is unable to do even the most "basic" activities such as walking, eating, bathing, dressing, transferring to a bed, or toileting. Fine motor skills are lost. It is difficult to think clearly.   Incapacitating pain 9 Pain becomes incapacitating. Thought processing is no longer possible. Difficult to remember your own name. Control of movement and coordination are lost.  The worst pain imaginable 10 At this level, most patients pass out from pain. When this level is reached, collapse of the autonomic nervous system occurs, leading to a sudden drop in blood pressure and heart rate. This in turn results in a temporary and dramatic  drop in blood flow to the brain, leading to a loss of consciousness. Fainting is one of the body's self defense mechanisms. Passing out puts the brain in a calmed state and causes it to shut down for a while, in order to begin the healing process.    Summary: 1. Refer to this scale when providing [place] with your pain level. 2. Be accurate and careful when reporting your pain level. This will help with your care. 3. Over-reporting your pain level will lead to loss of credibility. 4. Even a level of 1/10 means that there is pain and will be treated at our facility. 5. High, inaccurate reporting will be documented as "Symptom Exaggeration", leading to loss of credibility and suspicions of possible secondary gains such as obtaining more narcotics, or wanting to appear disabled, for fraudulent reasons. 6. Only pain levels of 5 or below will be seen at our facility. 7. Pain levels of 6 and above will be sent to the Emergency Department and the appointment cancelled. ____________________________________________________________________________________________   ____________________________________________________________________________________________  CANNABIDIOL (AKA: CBD Oil or Pills)  Applies to: All patients receiving prescriptions of controlled substances (written and/or electronic).  General Information: Cannabidiol (CBD) was discovered in 60. It is one of some 113 identified cannabinoids in cannabis (Marijuana) plants, accounting for up to 40% of the plant's extract. As of 2018, preliminary clinical research on cannabidiol included studies of anxiety, cognition, movement disorders, and pain.  Cannabidiol is consummed in multiple ways, including inhalation of cannabis smoke or vapor, as an aerosol spray into the cheek, and by mouth. It may be supplied as CBD oil containing CBD as the active ingredient (no added tetrahydrocannabinol (THC) or terpenes), a full-plant CBD-dominant hemp extract oil,  capsules, dried cannabis, or as a liquid solution. CBD is thought not have the same psychoactivity as THC, and may affect the actions of THC. Studies suggest that CBD may interact with different biological targets, including cannabinoid receptors and other neurotransmitter receptors. As of 2018 the mechanism of action for its biological effects has not been determined.  In the [place], cannabidiol has a limited approval by the Food and Drug Administration (FDA) for treatment of only two types of epilepsy disorders. The side effects of long-term use of the drug include somnolence, decreased appetite, diarrhea, fatigue, malaise, weakness, sleeping problems, and others.  CBD remains a Schedule I drug prohibited for any use.  Legality: Some manufacturers ship CBD products nationally, an illegal action which the FDA has not enforced in 2018, with CBD remaining the subject of an FDA investigational new drug evaluation, and is not considered legal as a dietary supplement or food ingredient as of December 2018. Federal illegality has made it difficult historically to conduct research on CBD. CBD is openly sold in head shops and health food stores in some states where such sales have not been explicitly legalized.  Warning: Because it is not FDA approved for general use or treatment of pain, it is not required to undergo the same manufacturing controls as prescription drugs.  This means that the available cannabidiol (CBD) may be contaminated with THC.  If this is the case, it will trigger a positive urine drug screen (UDS) test for cannabinoids (Marijuana).  Because  a positive UDS for illicit substances is a violation of our medication agreement, your opioid analgesics (pain medicine) may be permanently discontinued. (Last update: 06/05/2017) ____________________________________________________________________________________________

## 2018-01-21 NOTE — Progress Notes (Signed)
Nursing Pain Medication Assessment:  Safety precautions to be maintained throughout the outpatient stay will include: orient to surroundings, keep bed in low position, maintain call bell within reach at all times, provide assistance with transfer out of bed and ambulation.  Medication Inspection Compliance: Lauren Greene did not comply with our request to bring her pills to be counted. She was reminded that bringing the medication bottles, even when empty, is a requirement.  Medication: None brought in. Pill/Patch Count: None available to be counted. Bottle Appearance: No container available. Did not bring bottle(s) to appointment. Filled Date: N/A Last Medication intake:  Today 

## 2018-01-22 DIAGNOSIS — Z79891 Long term (current) use of opiate analgesic: Secondary | ICD-10-CM | POA: Diagnosis not present

## 2018-01-26 NOTE — Progress Notes (Signed)
[facility] MD/PA/NP OP Progress Note  02/02/2018 3:34 PM Lauren Greene  MRN:  [id number]  Chief Complaint:  Chief Complaint    Depression; Follow-up     HPI:  Patient presents for follow-up appointment for depression.  She states that she has been doing well.  She was appointed to make crafts for Christmas; she is working on Pharmacologist and cookies.  She visited [place] for wedding of her friend's daughter.  Although it was interesting experience, she had a good time.  Although she still struggles with pain, she does not ruminate on it.  She has fair sleep; she takes trazodone from 100 to 200 mg.  She denies feeling depressed.  She denies irritability; she denies any incident since spring.  She feels she learned the lesson, and she hopes to respond in a different way if she were to be in the similar situation.  She denies SI.  She feels anxious and tense at times.  She occasionally takes Valium.  She denies panic attacks.  She has difficulty in concentration.  She has been unable to complete tasks as she is distracted.  She tends to be late for the appointment. She has been that way for many years, although adderall helped some for her symptoms.   Per PMP,  On oxycodone,  Valium on 11/03/2017    Wt Readings from Last 3 Encounters:  02/02/18 249 lb (112.9 kg)  01/21/18 243 lb (110.2 kg)  01/09/18 248 lb (112.5 kg)    Visit Diagnosis:    ICD-10-CM   1. Mild episode of recurrent major depressive disorder (HCC) F33.0     Past Psychiatric History: Please see initial evaluation for full details. I have reviewed the history. No updates at this time.     Past Medical History:  Past Medical History:  Diagnosis Date  . ADHD (attention deficit hyperactivity disorder)   . Arthritis   . Back pain   . Broken femur (HCC) 08/27/12  . Migraine   . Osteoarthritis     Past Surgical History:  Procedure Laterality Date  . BACK SURGERY    . CERVICAL BIOPSY  W/ LOOP ELECTRODE EXCISION   1990's   in [place], [place]  . ENDOMETRIAL BIOPSY  04-05-05 and 02-12-06   04-05-05 benign but too rapid growth. RXd Provera and repeat Bx 2mo. 02-12-06 Bx benign--Dr. Leda Quail  . FEMUR IM NAIL Left 08/27/2012   Procedure: INTRAMEDULLARY (IM) RETROGRADE FEMORAL NAILING;  Surgeon: Kathryne Hitch, MD;  Location: [facility] OR;  Service: Orthopedics;  Laterality: Left;  . HEEL SPUR SURGERY    . IM NAILING FEMORAL SHAFT FRACTURE Left 08/27/2012   Dr Magnus Ivan  . SHOULDER SURGERY Left   . TOTAL KNEE ARTHROPLASTY     x2  . TUBAL LIGATION    . VAGINAL HYSTERECTOMY      Family Psychiatric History: Please see initial evaluation for full details. I have reviewed the history. No updates at this time.     Family History:  Family History  Problem Relation Age of Onset  . Cancer Mother   . Breast cancer Mother   . Bipolar disorder Mother   . Heart failure Father   . Cancer Father   . Cancer Other   . Heart failure Other   . Bipolar disorder Sister     Social History:  Social History   Socioeconomic History  . Marital status: Single    Spouse name: Not on file  . Number of children: Not on  file  . Years of education: Not on file  . Highest education level: Not on file  Occupational History  . Not on file  Social Needs  . Financial resource strain: Not on file  . Food insecurity:    Worry: Not on file    Inability: Not on file  . Transportation needs:    Medical: Not on file    Non-medical: Not on file  Tobacco Use  . Smoking status: Former Smoker    Years: 10.00    Types: Cigarettes    Last attempt to quit: 08/16/2000    Years since quitting: 17.4  . Smokeless tobacco: Never Used  Substance and Sexual Activity  . Alcohol use: Yes    Alcohol/week: 0.0 standard drinks    Comment: rarely  . Drug use: No  . Sexual activity: Not Currently    Partners: Female  Lifestyle  . Physical activity:    Days per week: Not on file    Minutes per session: Not on file  . Stress: Not  on file  Relationships  . Social connections:    Talks on phone: Not on file    Gets together: Not on file    Attends religious service: Not on file    Active member of club or organization: Not on file    Attends meetings of clubs or organizations: Not on file    Relationship status: Not on file  Other Topics Concern  . Not on file  Social History Narrative  . Not on file    Allergies:  Allergies  Allergen Reactions  . Wellbutrin [Bupropion]     Anger issues  . Lyrica [Pregabalin] Swelling    Leg swelling    Metabolic Disorder Labs: Lab Results  Component Value Date   HGBA1C 5.6 07/22/2014   MPG 103 02/16/2013   MPG 111 07/24/2010   No results found for: PROLACTIN Lab Results  Component Value Date   CHOL 185 01/09/2018   TRIG 102 01/09/2018   HDL 50 01/09/2018   CHOLHDL 3.7 01/09/2018   VLDL 13 07/10/2012   LDLCALC 115 (H) 01/09/2018   LDLCALC 116 (H) 03/15/2017   Lab Results  Component Value Date   TSH 4.104 06/24/2013   TSH 4.142 01/07/2013    Therapeutic Level Labs: No results found for: LITHIUM No results found for: VALPROATE No components found for:  CBMZ  Current Medications: Current Outpatient Medications  Medication Sig Dispense Refill  . amphetamine-dextroamphetamine (ADDERALL XR) 30 MG 24 hr capsule 2 qam 60 capsule 0  . amphetamine-dextroamphetamine (ADDERALL XR) 30 MG 24 hr capsule 2 q am 60 capsule 0  . amphetamine-dextroamphetamine (ADDERALL XR) 30 MG 24 hr capsule 2 q am 60 capsule 0  . cephALEXin (KEFLEX) 500 MG capsule Take 1 capsule (500 mg total) by mouth 4 (four) times daily. 28 capsule 0  . clobetasol ointment (TEMOVATE) 0.05 % Apply 1 application topically as needed.     . diazepam (VALIUM) 2 MG tablet Take 1 tablet (2 mg total) by mouth daily as needed for anxiety. 30 tablet 0  . Diclofenac Sodium CR 100 MG 24 hr tablet TAKE 1 TABLET BY MOUTH TWICE DAILY. TAKE WITH FOOD. 60 tablet 5  . DULoxetine (CYMBALTA) 60 MG capsule Take 2  capsules (120 mg total) by mouth daily. 180 capsule 0  . lidocaine (XYLOCAINE) 2 % jelly Apply 1 application topically 2 (two) times daily. 4 mL 1  . lidocaine (XYLOCAINE) 2 % solution TAKE 5 TO 10  MLS BY MOUTH TWICE DAILY AS NEEDED. 100 mL 0  . methocarbamol (ROBAXIN) 750 MG tablet Take 1 tablet (750 mg total) by mouth every 8 (eight) hours as needed for muscle spasms. 90 tablet 4  . naloxegol oxalate (MOVANTIK) 25 MG TABS tablet Take by mouth.    . nystatin (MYCOSTATIN/NYSTOP) powder APPLY TO AFFECTED AREA(S) TOPICALLY TWICE DAILY. 45 g 6  . omeprazole (PRILOSEC) 40 MG capsule TAKE (1) CAPSULE BY MOUTH ONCE DAILY. 30 capsule 12  . oxyCODONE-acetaminophen (PERCOCET) 10-325 MG tablet Take 1 tablet by mouth every 8 (eight) hours as needed for pain. 90 tablet 0  . traZODone (DESYREL) 100 MG tablet 100-200 mg at night as needed for sleep 180 tablet 0   No current facility-administered medications for this visit.      Musculoskeletal: Strength & Muscle Tone: within normal limits Gait & Station: unsteady- Patient leans: N/A  Psychiatric Specialty Exam: Review of Systems  Psychiatric/Behavioral: Negative for depression, hallucinations, memory loss, substance abuse and suicidal ideas. The patient is nervous/anxious and has insomnia.   All other systems reviewed and are negative.   Blood pressure (!) 150/80, pulse (!) 54, height 5\' 7"  (1.702 m), weight 249 lb (112.9 kg), last menstrual period 03/18/2004, SpO2 99 %.Body mass index is 39 kg/m.  General Appearance: Fairly Groomed  Eye Contact:  Good  Speech:  Clear and Coherent  Volume:  Normal  Mood:  "good"  Affect:  Appropriate, Congruent and euthymic  Thought Process:  Coherent  Orientation:  Full (Time, Place, and Person)  Thought Content: Logical   Suicidal Thoughts:  No  Homicidal Thoughts:  No  Memory:  Immediate;   Good  Judgement:  Good  Insight:  Fair  Psychomotor Activity:  Normal  Concentration:  Concentration: Good and  Attention Span: Good  Recall:  Good  Fund of Knowledge: Good  Language: Good  Akathisia:  No  Handed:  Right  AIMS (if indicated): not done  Assets:  Communication Skills Desire for Improvement  ADL's:  Intact  Cognition: WNL  Sleep:  Fair   Screenings: PHQ2-9     Procedure visit from 12/01/2017 in [facility] Clinical Support from 10/22/2017 in [facility] Clinical Support from 09/03/2017 in [facility] Clinical Support from 07[facility]15/2017 in [facility] Clinical Support from 06/17/2017 in [facility]  PHQ-2 Total Score  0  0  0  0  0       Assessment and Plan:  Lauren Greene is a 52 y.o. year old female with a history of depression, ADHD by history, anxiety, hyperhydrosis, chronic pain  , who presents for follow up appointment for Mild episode of recurrent major depressive disorder (HCC)  # MDD, mild, recurrent without psychotic features Patient denies significant depressive symptoms except mild anxiety since her last appointment.  Will continue duloxetine to target depression, pain.  Discussed risk of hypertension.  Will continue trazodone as needed for insomnia.  Will continue Valium as needed for anxiety.  Discussed risk of dependence and oversedation.  Discussed behavioral activation.   # ADHD She was diagnosed with ADHD by PCP; she reports benefit from Adderall, although she still struggles with concentration. She is on higher dose of adderall; she is advised to try utilizing coping skills.   Plan I have reviewed and updated plans as below 1 Continue duloxetine 120 mg daily  2. Continue Trazodone 100 - 200 mg at night as needed for sleep 3.Continue valium 2 mg daily as needed for anxiety (she declines refill at this time) 4.Return to clinic in threemonthsfor  15 mins (Patient is on Adderall XR 60 mg daily prescribed by her PCP)  The patient demonstrates the following risk factors for suicide: Chronic risk factors for suicide include: psychiatric disorder of depressionand chronic pain. Acute risk factorsfor suicide include: loss (financial, interpersonal, professional). Protective factorsfor this patient include: responsibility to others (children, family), coping skills and hope for the future. Considering these factors, the overall suicide risk at this point appears to be low. Patient isappropriate for outpatient follow up.  The duration of this appointment visit was 30 minutes of face-to-face time with the patient.  Greater than 50% of this time was spent in counseling, explanation of  diagnosis, planning of further management, and coordination of care.  Neysa Hotter, MD 02/02/2018, 3:34 PM

## 2018-01-27 ENCOUNTER — Other Ambulatory Visit: Payer: Self-pay | Admitting: Nurse Practitioner

## 2018-01-27 ENCOUNTER — Telehealth: Payer: Self-pay | Admitting: *Deleted

## 2018-01-27 LAB — TOXASSURE SELECT 13 (MW), URINE

## 2018-01-27 MED ORDER — OXYCODONE-ACETAMINOPHEN 10-325 MG PO TABS: 1.0000 | ORAL_TABLET | Freq: Three times a day (TID) | ORAL | 0 refills | Status: DC | PRN

## 2018-01-27 NOTE — Telephone Encounter (Signed)
Voicemail left with patient that UDS is back and evidence of THC was there.  Crystal did call in 1 month of medication and would like to see patient back in 3 weeks, per Dr Cherylann Ratel.  Will send to front desk for scheduling.

## 2018-01-27 NOTE — Progress Notes (Signed)
Patient was seen on the above date.  Admitted to the use of CBD oil after requesting a UDS.  The UDS was requested because she did not bring her medication and because previous UDS this do not indicate that there has ever been any oxycodone in her urine.  I am forwarding this to you make you aware she was not given a refill at the time of visit. She did have enough medication per patient until the UDS results. Would you like me to send a 30 days supply or repeat the UDS.

## 2018-01-29 ENCOUNTER — Encounter: Payer: Self-pay | Admitting: Nurse Practitioner

## 2018-01-29 DIAGNOSIS — F1290 Cannabis use, unspecified, uncomplicated: Secondary | ICD-10-CM | POA: Insufficient documentation

## 2018-01-30 NOTE — Telephone Encounter (Signed)
lvmail asking patient to call back and confirm appt w/ [facility] for med mgmt. Canceled appt w/ dr. Cherylann Ratellateef for feb.

## 2018-02-02 ENCOUNTER — Ambulatory Visit (INDEPENDENT_AMBULATORY_CARE_PROVIDER_SITE_OTHER): Payer: PPO | Admitting: Psychiatry

## 2018-02-02 VITALS — BP 150/80 | HR 54 | Ht 67.0 in | Wt 249.0 lb

## 2018-02-02 DIAGNOSIS — F330 Major depressive disorder, recurrent, mild: Secondary | ICD-10-CM | POA: Diagnosis not present

## 2018-02-02 MED ORDER — DIAZEPAM 2 MG PO TABS: 2.0000 mg | ORAL_TABLET | Freq: Every day | ORAL | 0 refills | Status: DC | PRN

## 2018-02-02 MED ORDER — TRAZODONE HCL 100 MG PO TABS: ORAL_TABLET | ORAL | 0 refills | Status: DC

## 2018-02-02 MED ORDER — DULOXETINE HCL 60 MG PO CPEP: 120.0000 mg | ORAL_CAPSULE | Freq: Every day | ORAL | 0 refills | Status: DC

## 2018-02-02 NOTE — Patient Instructions (Addendum)
1 Continue duloxetine 120 mg daily  2. Continue Trazodone 100 - 200 mg at night as needed for sleep 3.Continue valium 2 mg daily as needed for anxiety  4.Return to clinic in threemonthsfor 15 mins

## 2018-02-13 ENCOUNTER — Other Ambulatory Visit: Payer: Self-pay | Admitting: Family Medicine

## 2018-02-16 NOTE — Progress Notes (Deleted)
Patient's Name: Lauren Greene  MRN: [id number]  Referring Provider: Kathyrn Drown, MD  DOB: 1966-02-03  PCP: Kathyrn Drown, MD  DOS: 02/17/2018  Note by: Vevelyn Francois NP  Service setting: Ambulatory outpatient  Specialty: Interventional Pain Management  Location: [facility] (AMB) Pain Management Facility    Patient type: Established    Primary Reason(s) for Visit: Encounter for prescription drug management. (Level of risk: moderate)  CC: No chief complaint on file.  HPI  Lauren Greene is a 52 y.o. year old, female patient, who comes today for a medication management evaluation. 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 Her primarily concern today is the No chief complaint on file.  Pain Assessment: Location:     Radiating:   Onset:   Duration:   Quality:   Severity:  /10 (subjective, self-reported pain score)  Note: Reported level is compatible with observation.                         When using our objective Pain Scale, levels between 6 and 10/10 are said to belong in an emergency room, as it progressively worsens from a 6/10, described as severely limiting, requiring emergency care not usually available at an outpatient pain management facility. At a 6/10 level, communication becomes difficult and requires great effort. Assistance to reach the emergency department may be required. Facial  flushing and profuse sweating along with potentially dangerous increases in heart rate and blood pressure will be evident. Effect on ADL:   Timing:   Modifying factors:   BP:    HR:    Lauren Greene was last scheduled for an appointment on 01/21/2018 for medication management. During today's appointment we reviewed Lauren Greene's chronic pain status, as well as her outpatient medication regimen.  The patient  reports that she does not use drugs. Her body mass index is unknown because there is no height or weight on file.  Further details on both, my assessment(s), as well as the proposed treatment plan, please see below.  Controlled Substance Pharmacotherapy Assessment REMS (Risk Evaluation and Mitigation Strategy)  Analgesic:Percocet 10 mg TID MME/day:40-'45mg'$ /day.  No notes on file Pharmacokinetics: Liberation and absorption (onset of action): WNL Distribution (time to peak effect): WNL Metabolism and excretion (duration of action): WNL         Pharmacodynamics: Desired effects: Analgesia: Lauren Greene reports >50% benefit. Functional ability: Patient reports that medication allows her to accomplish basic ADLs Clinically meaningful improvement in function (CMIF): Sustained CMIF goals met Perceived effectiveness: Described as relatively effective, allowing for increase in activities of daily living (ADL) Undesirable effects: Side-effects or Adverse reactions: None reported Monitoring: [place] PMP: Online review of the past 5-monthperiod conducted. Compliant with practice rules and regulations Last UDS on record: Summary  Date Value Ref Range Status  01/22/2018 FINAL  Final    Comment:    ==================================================================== TOXASSURE SELECT 13 (MW) ==================================================================== Test  Result       Flag       Units Drug Present and Declared for Prescription Verification    Amphetamine                    >3534        EXPECTED   ng/mg creat    Amphetamine is available as a schedule II prescription drug.   Oxycodone                      2518         EXPECTED   ng/mg creat   Oxymorphone                    1546         EXPECTED   ng/mg creat   Noroxycodone                   >3534        EXPECTED   ng/mg creat   Noroxymorphone                 275          EXPECTED   ng/mg creat    Sources of oxycodone are scheduled prescription medications.    Oxymorphone, noroxycodone, and noroxymorphone are expected    metabolites of oxycodone. Oxymorphone is also available as a    scheduled prescription medication. Drug Present not Declared for Prescription Verification   Carboxy-THC                    16           UNEXPECTED ng/mg creat    Carboxy-THC is a metabolite of tetrahydrocannabinol  (THC).    Source of  [facility] is most commonly illicit, but THC is also present    in a scheduled prescription medication. Drug Absent but Declared for Prescription Verification   Diazepam                       Not Detected UNEXPECTED ng/mg creat ==================================================================== Test                      Result    Flag   Units      Ref Range   Creatinine              283              mg/dL      >=20 ==================================================================== Declared Medications:  The flagging and interpretation on this report are based on the  following declared medications.  Unexpected results may arise from  inaccuracies in the declared medications.  **Note: The testing scope of this panel includes these medications:  Amphetamine (Adderall)  Diazepam  Oxycodone (Oxycodone Acetaminophen)  **Note: The testing scope of this panel does not include following  reported medications:  Acetaminophen (Oxycodone Acetaminophen)  Cephalexin  Clobetasol  Diclofenac  Duloxetine  Lidocaine  Methocarbamol  Naloxegol  Nystatin  Omeprazole   Trazodone ==================================================================== For clinical consultation, please call [phone]. ====================================================================    UDS interpretation: Non-Compliant  illicit substance detected, final warning  Medication Assessment Form: Reviewed. Abnormalities discussed Treatment compliance: Deficiencies noted and steps taken to remind the patient of the seriousness of adequate therapy compliance Risk Assessment Profile: Aberrant behavior: use of illicit substances Comorbid factors increasing risk of overdose: age 13-41 years old and caucasian Opioid risk tool (ORT) (  Total Score):   Personal History of Substance Abuse (SUD-Substance use disorder):  Alcohol:    Illegal Drugs:    Rx Drugs:    ORT Risk Level calculation:   Risk of substance use disorder (SUD): Very High  ORT Scoring interpretation table:  Score <3 = Low Risk for SUD  Score between 4-7 = Moderate Risk for SUD  Score >8 = High Risk for Opioid Abuse   Risk Mitigation Strategies:  Patient Counseling: Covered Patient-Prescriber Agreement (PPA): Present and active  Notification to other healthcare providers: Done  Pharmacologic Plan: No change in therapy, at this time.             Laboratory Chemistry  Inflammation Markers (CRP: Acute Phase) (ESR: Chronic Phase) Lab Results  Component Value Date   CRP 1.7 08/03/2014   ESRSEDRATE 2 08/03/2014                         Rheumatology Markers Lab Results  Component Value Date   RF 12 06/24/2013   ANA Negative 08/03/2014                        Renal Function Markers Lab Results  Component Value Date   BUN 16 01/09/2018   CREATININE 0.70 01/09/2018   BCR 23 01/09/2018   GFRAA 114 01/09/2018   GFRNONAA 99 01/09/2018                             Hepatic Function Markers Lab Results  Component Value Date   AST 26 03/15/2017   ALT 26 03/15/2017   ALBUMIN 4.3 03/15/2017   ALKPHOS 117  03/15/2017   LIPASE 25 01/12/2016                        Electrolytes Lab Results  Component Value Date   NA 142 01/09/2018   K 4.5 01/09/2018   CL 103 01/09/2018   CALCIUM 9.2 01/09/2018                        Neuropathy Markers Lab Results  Component Value Date   HGBA1C 5.6 07/22/2014                        CNS Tests No results found for: COLORCSF, APPEARCSF, RBCCOUNTCSF, WBCCSF, POLYSCSF, LYMPHSCSF, EOSCSF, PROTEINCSF, GLUCCSF, JCVIRUS, CSFOLI, IGGCSF                      Bone Pathology Markers Lab Results  Component Value Date   VD25OH 31 01/07/2013                         Coagulation Parameters Lab Results  Component Value Date   INR 1.04 02/16/2013   LABPROT 13.6 02/16/2013   APTT 29 02/16/2013   PLT 274 01/12/2016                        Cardiovascular Markers Lab Results  Component Value Date   CKTOTAL 139 08/03/2014   CKMB 2.8 06/24/2013   HGB 14.6 01/12/2016   HCT 43.4 01/12/2016                         CA Markers No results found for:  CEA, CA125, LABCA2                      Note: Lab results reviewed.  Recent Diagnostic Imaging Results  DG Wrist Complete Left CLINICAL DATA:  52 year old female. No injury. Increasing left wrist pain around distal ulna. Initial encounter.  EXAM: LEFT WRIST - COMPLETE 3+ VIEW  COMPARISON:  01/30/2009 left thumb MR.  FINDINGS: Mild degenerative changes distal radial lunate articulation. Slight increase size of interspace between the lunate and scaphoid may indicate ligamentous laxity and/or result of remote ligamentous injury. Minimal spur triquetrum. Moderate left first metacarpal phalangeal joint degenerative changes. No discrete osseous abnormality of the distal ulnar. No fracture or dislocation.  IMPRESSION: 1. Mild degenerative changes distal radial lunate articulation. 2. Slight increase size of interspace between the lunate and scaphoid may indicate ligamentous laxity and/or result of  remote ligamentous injury. 3. Minimal spur triquetrum. 4. Moderate left first metacarpal phalangeal joint degenerative changes. 5. No discrete osseous abnormality of the distal ulnar.  Electronically Signed   By: Genia Del M.D.   On: 01/12/2018 08:37  Complexity Note: Imaging results reviewed. Results shared with Lauren Greene, using Layman's terms.                         Meds   Current Outpatient Medications:  .  amphetamine-dextroamphetamine (ADDERALL XR) 30 MG 24 hr capsule, 2 qam, Disp: 60 capsule, Rfl: 0 .  amphetamine-dextroamphetamine (ADDERALL XR) 30 MG 24 hr capsule, 2 q am, Disp: 60 capsule, Rfl: 0 .  amphetamine-dextroamphetamine (ADDERALL XR) 30 MG 24 hr capsule, 2 q am, Disp: 60 capsule, Rfl: 0 .  cephALEXin (KEFLEX) 500 MG capsule, Take 1 capsule (500 mg total) by mouth 4 (four) times daily., Disp: 28 capsule, Rfl: 0 .  clobetasol ointment (TEMOVATE) 0.86 %, Apply 1 application topically as needed. , Disp: , Rfl:  .  diazepam (VALIUM) 2 MG tablet, Take 1 tablet (2 mg total) by mouth daily as needed for anxiety., Disp: 30 tablet, Rfl: 0 .  Diclofenac Sodium CR 100 MG 24 hr tablet, TAKE 1 TABLET BY MOUTH TWICE DAILY. TAKE WITH FOOD., Disp: 60 tablet, Rfl: 5 .  Diclofenac Sodium CR 100 MG 24 hr tablet, TAKE 1 TABLET BY MOUTH TWICE DAILY. TAKE WITH FOOD., Disp: 60 tablet, Rfl: 0 .  DULoxetine (CYMBALTA) 60 MG capsule, Take 2 capsules (120 mg total) by mouth daily., Disp: 180 capsule, Rfl: 0 .  lidocaine (XYLOCAINE) 2 % jelly, Apply 1 application topically 2 (two) times daily., Disp: 4 mL, Rfl: 1 .  lidocaine (XYLOCAINE) 2 % solution, TAKE 5 TO 10 MLS BY MOUTH TWICE DAILY AS NEEDED., Disp: 100 mL, Rfl: 0 .  methocarbamol (ROBAXIN) 750 MG tablet, Take 1 tablet (750 mg total) by mouth every 8 (eight) hours as needed for muscle spasms., Disp: 90 tablet, Rfl: 4 .  naloxegol oxalate (MOVANTIK) 25 MG TABS tablet, Take by mouth., Disp: , Rfl:  .  nystatin (MYCOSTATIN/NYSTOP)  powder, APPLY TO AFFECTED AREA(S) TOPICALLY TWICE DAILY., Disp: 45 g, Rfl: 6 .  omeprazole (PRILOSEC) 40 MG capsule, TAKE (1) CAPSULE BY MOUTH ONCE DAILY., Disp: 30 capsule, Rfl: 12 .  oxyCODONE-acetaminophen (PERCOCET) 10-325 MG tablet, Take 1 tablet by mouth every 8 (eight) hours as needed for pain., Disp: 90 tablet, Rfl: 0 .  traZODone (DESYREL) 100 MG tablet, 100-200 mg at night as needed for sleep, Disp: 180 tablet, Rfl: 0  ROS  Constitutional:  Denies any fever or chills Gastrointestinal: No reported hemesis, hematochezia, vomiting, or acute GI distress Musculoskeletal: Denies any acute onset joint swelling, redness, loss of ROM, or weakness Neurological: No reported episodes of acute onset apraxia, aphasia, dysarthria, agnosia, amnesia, paralysis, loss of coordination, or loss of consciousness  Allergies  Lauren Greene is allergic to wellbutrin [bupropion] and lyrica [pregabalin].  [place]  Drug: Lauren Greene  reports that she does not use drugs. Alcohol:  reports that she drinks alcohol. Tobacco:  reports that she quit smoking about 17 years ago. Her smoking use included cigarettes. She quit after 10.00 years of use. She has never used smokeless tobacco. Medical:  has a past medical history of ADHD (attention deficit hyperactivity disorder), Arthritis, Back pain, Broken femur ([place]) (08/27/12), Migraine, and Osteoarthritis. Surgical: Lauren Greene  has a past surgical history that includes Total knee arthroplasty; Heel spur surgery; Back surgery; IM nailing femoral shaft fracture (Left, 08/27/2012); Femur IM nail (Left, 08/27/2012); Cervical biopsy w/ loop electrode excision (1990's); Endometrial biopsy (04-05-05 and 02-12-06); Tubal ligation; Shoulder surgery (Left); and Vaginal hysterectomy. Family: family history includes Bipolar disorder in her mother and sister; Breast cancer in her mother; Cancer in her father, mother, and other; Heart failure in her father and other.  Constitutional Exam   General appearance: Well nourished, well developed, and well hydrated. In no apparent acute distress There were no vitals filed for this visit. BMI Assessment: Estimated body mass index is 39 kg/m as calculated from the following:   Height as of 02/02/18: '5\' 7"'$  (1.702 m).   Weight as of 02/02/18: 249 lb (112.9 kg).  BMI interpretation table: BMI level Category Range association with higher incidence of chronic pain  <18 kg/m2 Underweight   18.5-24.9 kg/m2 Ideal body weight   25-29.9 kg/m2 Overweight Increased incidence by 20%  30-34.9 kg/m2 Obese (Class I) Increased incidence by 68%  35-39.9 kg/m2 Severe obesity (Class II) Increased incidence by 136%  >40 kg/m2 Extreme obesity (Class III) Increased incidence by 254%   Patient's current BMI Ideal Body weight  There is no height or weight on file to calculate BMI. Ideal body weight: 61.6 kg (135 lb 12.9 oz) Adjusted ideal body weight: 82.1 kg (181 lb 1.3 oz)   BMI Readings from Last 4 Encounters:  01/21/18 38.06 kg/m  01/09/18 37.71 kg/m  12/01/17 36.49 kg/m  10/22/17 36.19 kg/m   Wt Readings from Last 4 Encounters:  01/21/18 243 lb (110.2 kg)  01/09/18 248 lb (112.5 kg)  12/01/17 240 lb (108.9 kg)  10/22/17 238 lb (108 kg)  Psych/Mental status: Alert, oriented x 3 (person, place, & time)       Eyes: PERLA Respiratory: No evidence of acute respiratory distress  Cervical Spine Area Exam  Skin & Axial Inspection: No masses, redness, edema, swelling, or associated skin lesions Alignment: Symmetrical Functional ROM: Unrestricted ROM      Stability: No instability detected Muscle Tone/Strength: Functionally intact. No obvious neuro-muscular anomalies detected. Sensory (Neurological): Unimpaired Palpation: No palpable anomalies              Upper Extremity (UE) Exam    Side: Right upper extremity  Side: Left upper extremity  Skin & Extremity Inspection: Skin color, temperature, and hair growth are WNL. No peripheral edema  or cyanosis. No masses, redness, swelling, asymmetry, or associated skin lesions. No contractures.  Skin & Extremity Inspection: Skin color, temperature, and hair growth are WNL. No peripheral edema or cyanosis. No masses, redness, swelling, asymmetry, or associated skin lesions.  No contractures.  Functional ROM: Unrestricted ROM          Functional ROM: Unrestricted ROM          Muscle Tone/Strength: Functionally intact. No obvious neuro-muscular anomalies detected.  Muscle Tone/Strength: Functionally intact. No obvious neuro-muscular anomalies detected.  Sensory (Neurological): Unimpaired          Sensory (Neurological): Unimpaired          Palpation: No palpable anomalies              Palpation: No palpable anomalies              Provocative Test(s):  Phalen's test: deferred Tinel's test: deferred Apley's scratch test (touch opposite shoulder):  Action 1 (Across chest): deferred Action 2 (Overhead): deferred Action 3 (LB reach): deferred   Provocative Test(s):  Phalen's test: deferred Tinel's test: deferred Apley's scratch test (touch opposite shoulder):  Action 1 (Across chest): deferred Action 2 (Overhead): deferred Action 3 (LB reach): deferred    Thoracic Spine Area Exam  Skin & Axial Inspection: No masses, redness, or swelling Alignment: Symmetrical Functional ROM: Unrestricted ROM Stability: No instability detected Muscle Tone/Strength: Functionally intact. No obvious neuro-muscular anomalies detected. Sensory (Neurological): Unimpaired Muscle strength & Tone: No palpable anomalies  Lumbar Spine Area Exam  Skin & Axial Inspection: No masses, redness, or swelling Alignment: Symmetrical Functional ROM: Unrestricted ROM       Stability: No instability detected Muscle Tone/Strength: Functionally intact. No obvious neuro-muscular anomalies detected. Sensory (Neurological): Unimpaired Palpation: No palpable anomalies       Provocative Tests: Hyperextension/rotation test:  deferred today       Lumbar quadrant test (Kemp's test): deferred today       Lateral bending test: deferred today       Patrick's Maneuver: deferred today                   FABER test: deferred today                   S-I anterior distraction/compression test: deferred today         S-I lateral compression test: deferred today         S-I Thigh-thrust test: deferred today         S-I Gaenslen's test: deferred today          Gait & Posture Assessment  Ambulation: Unassisted Gait: Relatively normal for age and body habitus Posture: WNL   Lower Extremity Exam    Side: Right lower extremity  Side: Left lower extremity  Stability: No instability observed          Stability: No instability observed          Skin & Extremity Inspection: Skin color, temperature, and hair growth are WNL. No peripheral edema or cyanosis. No masses, redness, swelling, asymmetry, or associated skin lesions. No contractures.  Skin & Extremity Inspection: Skin color, temperature, and hair growth are WNL. No peripheral edema or cyanosis. No masses, redness, swelling, asymmetry, or associated skin lesions. No contractures.  Functional ROM: Unrestricted ROM                  Functional ROM: Unrestricted ROM                  Muscle Tone/Strength: Functionally intact. No obvious neuro-muscular anomalies detected.  Muscle Tone/Strength: Functionally intact. No obvious neuro-muscular anomalies detected.  Sensory (Neurological): Unimpaired        Sensory (Neurological): Unimpaired  DTR: Patellar: deferred today Achilles: deferred today Plantar: deferred today  DTR: Patellar: deferred today Achilles: deferred today Plantar: deferred today  Palpation: No palpable anomalies  Palpation: No palpable anomalies   Assessment  Primary Diagnosis & Pertinent Problem List: There were no encounter diagnoses.  Status Diagnosis  Controlled Controlled Controlled No diagnosis found.  Problems updated and reviewed during this  visit: No problems updated. Plan of Care  Pharmacotherapy (Medications Ordered): No orders of the defined types were placed in this encounter.  New Prescriptions   No medications on file   Medications administered today: Lauren Greene. Lauren Greene had no medications administered during this visit. Lab-work, procedure(s), and/or referral(s): No orders of the defined types were placed in this encounter.  Imaging and/or referral(s): None  Interventional therapies: Planned, scheduled, and/or pending:   Not at this time.   Considering:   ***   Palliative PRN treatment(s):   ***   Provider-requested follow-up: No follow-ups on file.  Future Appointments  Date Time Provider [place]  02/17/2018  9:15 AM Vevelyn Francois, NP [facility] None  04/06/2018  3:00 PM Kathyrn Drown, MD [facility]  05/05/2018  3:00 PM Norman Clay, MD [facility] None  06/23/2018  2:00 PM Megan Salon, MD [place] None   Primary Care Physician: Kathyrn Drown, MD Location: [facility] Note by: Vevelyn Francois NP Date: 02/17/2018; Time: 2:36 PM  Pain Score Disclaimer: We use the NRS-11 scale. This is a self-reported, subjective measurement of pain severity with only modest accuracy. It is used primarily to identify changes within a particular patient. It must be understood that outpatient pain scales are significantly less accurate that those used for research, where they can be applied under ideal controlled circumstances with minimal exposure to variables. In reality, the score is likely to be a combination of pain intensity and pain affect, where pain affect describes the degree of emotional arousal or changes in action readiness caused by the sensory experience of pain. Factors such as social and work situation, setting, emotional state, anxiety levels, expectation, and prior pain experience may influence pain perception and show large inter-individual differences that may also  be affected by time variables.  Patient instructions provided during this appointment: There are no Patient Instructions on file for this visit.

## 2018-02-17 ENCOUNTER — Encounter: Payer: Self-pay | Admitting: Nurse Practitioner

## 2018-02-19 ENCOUNTER — Encounter: Payer: Self-pay | Admitting: Family Medicine

## 2018-02-19 ENCOUNTER — Other Ambulatory Visit: Payer: Self-pay | Admitting: *Deleted

## 2018-02-19 MED ORDER — SULFACETAMIDE SODIUM 10 % OP SOLN: OPHTHALMIC | 0 refills | Status: DC

## 2018-02-19 NOTE — Telephone Encounter (Signed)
Called pt she states no severe eye pain or fever, mucus in eye, puffiness of the eyelid. Sulfacetamide eye drops 2 drops in the affected eye 4 times daily for 3-5. Per dr Lorin [place]. Pt advised to call back if not getting better or if worse.

## 2018-02-23 ENCOUNTER — Encounter: Payer: Self-pay | Admitting: Family Medicine

## 2018-02-23 ENCOUNTER — Other Ambulatory Visit: Payer: Self-pay | Admitting: *Deleted

## 2018-02-23 MED ORDER — SULFACETAMIDE SODIUM 10 % OP SOLN: OPHTHALMIC | 0 refills | Status: DC

## 2018-02-23 NOTE — Telephone Encounter (Signed)
Eye drops resent in per protocol. Pt notified through mychart.

## 2018-02-24 ENCOUNTER — Encounter: Payer: Self-pay | Admitting: Nurse Practitioner

## 2018-02-26 ENCOUNTER — Other Ambulatory Visit: Payer: Self-pay | Admitting: Nurse Practitioner

## 2018-02-26 ENCOUNTER — Other Ambulatory Visit: Payer: Self-pay

## 2018-02-26 ENCOUNTER — Ambulatory Visit: Payer: PPO | Attending: Nurse Practitioner | Admitting: Nurse Practitioner

## 2018-02-26 ENCOUNTER — Encounter: Payer: Self-pay | Admitting: Nurse Practitioner

## 2018-02-26 VITALS — BP 122/78 | HR 76 | Temp 98.2°F | Resp 18 | Ht 68.0 in | Wt 245.0 lb

## 2018-02-26 DIAGNOSIS — F988 Other specified behavioral and emotional disorders with onset usually occurring in childhood and adolescence: Secondary | ICD-10-CM | POA: Diagnosis not present

## 2018-02-26 DIAGNOSIS — R030 Elevated blood-pressure reading, without diagnosis of hypertension: Secondary | ICD-10-CM | POA: Insufficient documentation

## 2018-02-26 DIAGNOSIS — M25551 Pain in right hip: Secondary | ICD-10-CM | POA: Diagnosis not present

## 2018-02-26 DIAGNOSIS — G8929 Other chronic pain: Secondary | ICD-10-CM

## 2018-02-26 DIAGNOSIS — Z818 Family history of other mental and behavioral disorders: Secondary | ICD-10-CM | POA: Insufficient documentation

## 2018-02-26 DIAGNOSIS — G894 Chronic pain syndrome: Secondary | ICD-10-CM | POA: Insufficient documentation

## 2018-02-26 DIAGNOSIS — M21611 Bunion of right foot: Secondary | ICD-10-CM | POA: Insufficient documentation

## 2018-02-26 DIAGNOSIS — Z8249 Family history of ischemic heart disease and other diseases of the circulatory system: Secondary | ICD-10-CM | POA: Insufficient documentation

## 2018-02-26 DIAGNOSIS — M25561 Pain in right knee: Secondary | ICD-10-CM | POA: Diagnosis not present

## 2018-02-26 DIAGNOSIS — M47816 Spondylosis without myelopathy or radiculopathy, lumbar region: Secondary | ICD-10-CM | POA: Insufficient documentation

## 2018-02-26 DIAGNOSIS — M5136 Other intervertebral disc degeneration, lumbar region: Secondary | ICD-10-CM | POA: Insufficient documentation

## 2018-02-26 DIAGNOSIS — F909 Attention-deficit hyperactivity disorder, unspecified type: Secondary | ICD-10-CM | POA: Diagnosis not present

## 2018-02-26 DIAGNOSIS — Z79891 Long term (current) use of opiate analgesic: Secondary | ICD-10-CM | POA: Insufficient documentation

## 2018-02-26 DIAGNOSIS — M25552 Pain in left hip: Secondary | ICD-10-CM

## 2018-02-26 DIAGNOSIS — Z87891 Personal history of nicotine dependence: Secondary | ICD-10-CM | POA: Insufficient documentation

## 2018-02-26 DIAGNOSIS — K219 Gastro-esophageal reflux disease without esophagitis: Secondary | ICD-10-CM | POA: Insufficient documentation

## 2018-02-26 DIAGNOSIS — Z96659 Presence of unspecified artificial knee joint: Secondary | ICD-10-CM | POA: Diagnosis not present

## 2018-02-26 DIAGNOSIS — Z5181 Encounter for therapeutic drug level monitoring: Secondary | ICD-10-CM | POA: Diagnosis not present

## 2018-02-26 DIAGNOSIS — M797 Fibromyalgia: Secondary | ICD-10-CM | POA: Diagnosis not present

## 2018-02-26 DIAGNOSIS — F339 Major depressive disorder, recurrent, unspecified: Secondary | ICD-10-CM | POA: Diagnosis not present

## 2018-02-26 DIAGNOSIS — Z6837 Body mass index (BMI) 37.0-37.9, adult: Secondary | ICD-10-CM | POA: Insufficient documentation

## 2018-02-26 DIAGNOSIS — M25562 Pain in left knee: Secondary | ICD-10-CM

## 2018-02-26 DIAGNOSIS — F1290 Cannabis use, unspecified, uncomplicated: Secondary | ICD-10-CM | POA: Diagnosis not present

## 2018-02-26 DIAGNOSIS — Z79899 Other long term (current) drug therapy: Secondary | ICD-10-CM | POA: Diagnosis not present

## 2018-02-26 MED ORDER — OXYCODONE-ACETAMINOPHEN 10-325 MG PO TABS: 1.0000 | ORAL_TABLET | Freq: Three times a day (TID) | ORAL | 0 refills | Status: DC | PRN

## 2018-02-26 MED ORDER — METHOCARBAMOL 750 MG PO TABS: 750.0000 mg | ORAL_TABLET | Freq: Three times a day (TID) | ORAL | 0 refills | Status: DC | PRN

## 2018-02-26 NOTE — Patient Instructions (Addendum)
____________________________________________________________________________________________  Medication Rules  Purpose: To inform patients, and their family members, of our rules and regulations.  Applies to: All patients receiving prescriptions (written or electronic).  Pharmacy of record: Pharmacy where electronic prescriptions will be sent. If written prescriptions are taken to a different pharmacy, please inform the nursing staff. The pharmacy listed in the electronic medical record should be the one where you would like electronic prescriptions to be sent.  Electronic prescriptions: In compliance with the [place] Strengthen Opioid Misuse Prevention (STOP) Act of 2017 (Session Law 2017-74/H243), effective March 18, 2018, all controlled substances must be electronically prescribed. Calling prescriptions to the pharmacy will cease to exist.  Prescription refills: Only during scheduled appointments. Applies to all prescriptions.  NOTE: The following applies primarily to controlled substances (Opioid* Pain Medications).   Patient's responsibilities: 1. Pain Pills: Bring all pain pills to every appointment (except for procedure appointments). 2. Pill Bottles: Bring pills in original pharmacy bottle. Always bring the newest bottle. Bring bottle, even if empty. 3. Medication refills: You are responsible for knowing and keeping track of what medications you take and those you need refilled. The day before your appointment: write a list of all prescriptions that need to be refilled. The day of the appointment: give the list to the admitting nurse. Prescriptions will be written only during appointments. If you forget a medication: it will not be "Called in", "Faxed", or "electronically sent". You will need to get another appointment to get these prescribed. No early refills. Do not call asking to have your prescription filled early. 4. Prescription Accuracy: You are responsible for  carefully inspecting your prescriptions before leaving our office. Have the discharge nurse carefully go over each prescription with you, before taking them home. Make sure that your name is accurately spelled, that your address is correct. Check the name and dose of your medication to make sure it is accurate. Check the number of pills, and the written instructions to make sure they are clear and accurate. Make sure that you are given enough medication to last until your next medication refill appointment. 5. Taking Medication: Take medication as prescribed. When it comes to controlled substances, taking less pills or less frequently than prescribed is permitted and encouraged. Never take more pills than instructed. Never take medication more frequently than prescribed.  6. Inform other Doctors: Always inform, all of your healthcare providers, of all the medications you take. 7. Pain Medication from other Providers: You are not allowed to accept any additional pain medication from any other Doctor or Healthcare provider. There are two exceptions to this rule. (see below) In the event that you require additional pain medication, you are responsible for notifying us, as stated below. 8. Medication Agreement: You are responsible for carefully reading and following our Medication Agreement. This must be signed before receiving any prescriptions from our practice. Safely store a copy of your signed Agreement. Violations to the Agreement will result in no further prescriptions. (Additional copies of our Medication Agreement are available upon request.) 9. Laws, Rules, & Regulations: All patients are expected to follow all Federal and State Laws, Statutes, Rules, & Regulations. Ignorance of the Laws does not constitute a valid excuse. The use of any illegal substances is prohibited. 10. Adopted CDC guidelines & recommendations: Target dosing levels will be at or below 60 MME/day. Use of benzodiazepines** is not  recommended.  Exceptions: There are only two exceptions to the rule of not receiving pain medications from other Healthcare Providers. 1.   Exception #1 (Emergencies): In the event of an emergency (i.e.: accident requiring emergency care), you are allowed to receive additional pain medication. However, you are responsible for: As soon as you are able, call our office [phone], at any time of the day or night, and leave a message stating your name, the date and nature of the emergency, and the name and dose of the medication prescribed. In the event that your call is answered by a member of our staff, make sure to document and save the date, time, and the name of the person that took your information.  2. Exception #2 (Planned Surgery): In the event that you are scheduled by another doctor or dentist to have any type of surgery or procedure, you are allowed (for a period no longer than 30 days), to receive additional pain medication, for the acute post-op pain. However, in this case, you are responsible for picking up a copy of our "Post-op Pain Management for Surgeons" handout, and giving it to your surgeon or dentist. This document is available at our office, and does not require an appointment to obtain it. Simply go to our office during business hours (Monday-Thursday from 8:00 AM to 4:00 PM) (Friday 8:00 AM to 12:00 Noon) or if you have a scheduled appointment with us, prior to your surgery, and ask for it by name. In addition, you will need to provide us with your name, name of your surgeon, type of surgery, and date of procedure or surgery.  *Opioid medications include: morphine, codeine, oxycodone, oxymorphone, hydrocodone, hydromorphone, meperidine, tramadol, tapentadol, buprenorphine, fentanyl, methadone. **Benzodiazepine medications include: diazepam (Valium), alprazolam (Xanax), clonazepam (Klonopine), lorazepam (Ativan), clorazepate (Tranxene), chlordiazepoxide (Librium), estazolam (Prosom),  oxazepam (Serax), temazepam (Restoril), triazolam (Halcion) (Last updated: 05/15/2017) ____________________________________________________________________________________________    BMI Assessment: Estimated body mass index is 37.25 kg/m as calculated from the following:   Height as of this encounter: 5\' 8"  (1.727 m).   Weight as of this encounter: 245 lb (111.1 kg).  BMI interpretation table: BMI level Category Range association with higher incidence of chronic pain  <18 kg/m2 Underweight   18.5-24.9 kg/m2 Ideal body weight   25-29.9 kg/m2 Overweight Increased incidence by 20%  30-34.9 kg/m2 Obese (Class I) Increased incidence by 68%  35-39.9 kg/m2 Severe obesity (Class II) Increased incidence by 136%  >40 kg/m2 Extreme obesity (Class III) Increased incidence by 254%   Patient's current BMI Ideal Body weight  Body mass index is 37.25 kg/m. Ideal body weight: 63.9 kg (140 lb 14 oz) Adjusted ideal body weight: 82.8 kg (182 lb 8.4 oz)   BMI Readings from Last 4 Encounters:  02/26/18 37.25 kg/m  01/21/18 38.06 kg/m  01/09/18 37.71 kg/m  12/01/17 36.49 kg/m   Wt Readings from Last 4 Encounters:  02/26/18 245 lb (111.1 kg)  01/21/18 243 lb (110.2 kg)  01/09/18 248 lb (112.5 kg)  12/01/17 240 lb (108.9 kg)  ALL MEDICATIONS WERE E-SCRIBED TO YOUR PHARMACY OF CHOICE.

## 2018-02-26 NOTE — Progress Notes (Signed)
Patient's Name: Lauren Greene  MRN: [id number]  Referring Provider: Kathyrn Drown, MD  DOB: [birth year]  PCP: Kathyrn Drown, MD  DOS: 02/26/2018  Note by: Vevelyn Francois NP  Service setting: Ambulatory outpatient  Specialty: Interventional Pain Management  Location: [facility] (AMB) Pain Management Facility    Patient type: Established    Primary Reason(s) for Visit: Encounter for prescription drug management. (Level of risk: moderate)  CC: Back Pain (low and bilateral)  HPI  Ms. Laver is a 52 y.o. year old, female patient, who comes today for a medication management evaluation. 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 Her primarily concern today is the Back Pain (low and bilateral)  Pain Assessment: Location: Lower Back Radiating: denies Duration: Chronic pain Quality: Stabbing, Sharp, Burning, Constant Severity: 7 /10 (subjective, self-reported pain score)  Note: Reported level is compatible with observation. Clinically the patient looks like a 1/10              BP: 122/78  HR: 76  Ms. Vossler was last scheduled for an appointment on 02/24/2018 for medication management. During today's appointment we reviewed Ms. Diniz's chronic pain status, as well as her outpatient medication regimen. She denies any changes in her pain. She states that she owes me an apology for the last visit.  She admits that she is no longer using the CBD oil that she states that she had used once at the last visit. She states there was no benefit in the use of this.   The patient  reports no history of drug use. Her body mass index is 37.25 kg/m.  Further details on both, my assessment(s), as well as the proposed treatment plan, please see below.  Controlled Substance Pharmacotherapy Assessment REMS (Risk Evaluation and Mitigation Strategy)  Analgesic:Percocet 10 mg TID MME/day:40-'45mg'$ /day.  Hart Rochester, RN  02/26/2018  1:18 PM  Sign when Signing Visit Nursing Pain Medication Assessment:  Safety precautions to be maintained throughout the outpatient stay will include: orient to surroundings, keep bed in low position, maintain call bell within reach at all times, provide assistance with transfer out of bed and ambulation.  Medication Inspection Compliance: Pill count conducted under aseptic conditions, in front of the patient. Neither the pills nor the bottle was removed from the patient's sight at any time. Once count was completed pills were immediately returned to the patient in their original bottle.  Medication: Oxycodone IR Pill/Patch Count: 3 of 90 pills remain Pill/Patch Appearance: Markings consistent with prescribed medication Bottle Appearance: Standard pharmacy container. Clearly labeled. Filled Date: 27 / 12 / 2019 Last Medication intake:  Today   Pharmacokinetics: Liberation and absorption (onset of action): WNL Distribution (time to peak effect): WNL Metabolism and excretion (duration of action): WNL         Pharmacodynamics: Desired effects: Analgesia: Ms. Jablon reports >50% benefit. Functional ability: Patient reports that medication allows her to accomplish basic ADLs Clinically meaningful improvement in function (CMIF): Sustained CMIF goals met Perceived effectiveness: Described as relatively effective, allowing for increase in activities of daily  living  (ADL) Undesirable effects: Side-effects or Adverse reactions: None reported Monitoring: [place] PMP: Online review of the past 56-monthperiod conducted. Compliant with practice rules and regulations Last UDS on record: Summary  Date Value Ref Range Status  01/22/2018 FINAL  Final    Comment:    ==================================================================== TOXASSURE SELECT 13 (MW) ==================================================================== Test                             Result       Flag       Units Drug Present and Declared for Prescription Verification   Amphetamine                    >3534        EXPECTED   ng/mg creat    Amphetamine is available as a schedule II prescription drug.   Oxycodone                      2518         EXPECTED   ng/mg creat   Oxymorphone                    1546         EXPECTED   ng/mg creat   Noroxycodone                   >3534        EXPECTED   ng/mg creat   Noroxymorphone                 275          EXPECTED   ng/mg creat    Sources of oxycodone are scheduled prescription medications.    Oxymorphone, noroxycodone, and noroxymorphone are expected    metabolites of oxycodone. Oxymorphone is also available as a    scheduled prescription medication. Drug Present not Declared for Prescription Verification   Carboxy-THC                    16           UNEXPECTED ng/mg creat    Carboxy-THC is a metabolite of tetrahydrocannabinol  (THC).    [facility] most commonly illicit, but THC is also present    in a scheduled prescription medication. Drug Absent but Declared for Prescription Verification   Diazepam                       Not Detected UNEXPECTED ng/mg creat ==================================================================== Test                      Result    Flag   Units      Ref Range   Creatinine              283              mg/dL      >=20 ==================================================================== Declared  Medications:  The flagging and interpretation on this report are based on the  following declared medications.  Unexpected results may arise from  inaccuracies in the declared medications.  **Note: The testing scope of this panel includes these medications:  Amphetamine (Adderall)  Diazepam  Oxycodone (Oxycodone Acetaminophen)  **Note: The testing scope of this panel does not include following  reported medications:  Acetaminophen (Oxycodone Acetaminophen)  Cephalexin  Clobetasol  Diclofenac  Duloxetine  Lidocaine  Methocarbamol  Naloxegol  Nystatin  Omeprazole  Trazodone ==================================================================== For clinical consultation, please call [phone]. ====================================================================    UDS interpretation: Unexpected findings: Declared illicit substance detected Medication Assessment Form: Reviewed. Abnormalities discussed Treatment compliance: Non-compliant. Steps taken to remind the patient of the seriousness of adequate therapy compliance Risk Assessment Profile: Aberrant behavior: use of illicit substances Comorbid factors increasing risk of overdose: age 2-58 years old, Benzodiazepine use, bipolar disorder and caucasian Opioid risk tool (ORT) (Total Score): 2 Personal History of Substance Abuse (SUD-Substance use disorder):  Alcohol: Negative  Illegal Drugs: Negative  Rx Drugs: Negative  ORT Risk Level calculation: Low Risk Risk of substance use disorder (SUD): High Opioid Risk Tool - 02/26/18 1319      Family History of Substance Abuse   Alcohol  Negative    Illegal Drugs  Negative    Rx Drugs  Negative      Personal History of Substance Abuse   Alcohol  Negative    Illegal Drugs  Negative    Rx Drugs  Negative      Age   Age between 56-45 years   No      History of Preadolescent Sexual Abuse   History of Preadolescent Sexual Abuse  Negative or Female      Psychological Disease    Psychological Disease  Positive    ADD  Positive    Depression  Negative      Total Score   Opioid Risk Tool Scoring  2    Opioid Risk Interpretation  Low Risk      ORT Scoring interpretation table:  Score <3 = Low Risk for SUD  Score between 4-7 = Moderate Risk for SUD  Score >8 = High Risk for Opioid Abuse   Risk Mitigation Strategies:  Patient Counseling: Covered Patient-Prescriber Agreement (PPA): Present and active  Notification to other healthcare providers: Done  Pharmacologic Plan: No change in therapy, at this time.             Laboratory Chemistry  Inflammation Markers (CRP: Acute Phase) (ESR: Chronic Phase) Lab Results  Component Value Date   CRP 1.7 08/03/2014   ESRSEDRATE 2 08/03/2014                         Rheumatology Markers Lab Results  Component Value Date   RF 12 06/24/2013   ANA Negative 08/03/2014                        Renal Function Markers Lab Results  Component Value Date   BUN 16 01/09/2018   CREATININE 0.70 01/09/2018   BCR 23 01/09/2018   GFRAA 114 01/09/2018   GFRNONAA 99 01/09/2018                             Hepatic Function Markers Lab Results  Component Value Date   AST 26 03/15/2017   ALT 26 03/15/2017   ALBUMIN 4.3 03/15/2017   ALKPHOS 117 03/15/2017   LIPASE 25 01/12/2016                        Electrolytes Lab Results  Component Value Date   NA 142 01/09/2018   K 4.5 01/09/2018   CL 103 01/09/2018   CALCIUM 9.2 01/09/2018  Neuropathy Markers Lab Results  Component Value Date   HGBA1C 5.6 07/22/2014                        CNS Tests No results found for: COLORCSF, APPEARCSF, RBCCOUNTCSF, WBCCSF, POLYSCSF, LYMPHSCSF, EOSCSF, PROTEINCSF, GLUCCSF, JCVIRUS, CSFOLI, IGGCSF                      Bone Pathology Markers Lab Results  Component Value Date   VD25OH 31 01/07/2013                         Coagulation Parameters Lab Results  Component Value Date   INR 1.04 02/16/2013    LABPROT 13.6 02/16/2013   APTT 29 02/16/2013   PLT 274 01/12/2016                        Cardiovascular Markers Lab Results  Component Value Date   CKTOTAL 139 08/03/2014   CKMB 2.8 06/24/2013   HGB 14.6 01/12/2016   HCT 43.4 01/12/2016                         CA Markers No results found for: CEA, CA125, LABCA2                      Note: Lab results reviewed.  Recent Diagnostic Imaging Results  DG Wrist Complete Left CLINICAL DATA:  52 year old female. No injury. Increasing left wrist pain around distal ulna. Initial encounter.  EXAM: LEFT WRIST - COMPLETE 3+ VIEW  COMPARISON:  01/30/2009 left thumb MR.  FINDINGS: Mild degenerative changes distal radial lunate articulation. Slight increase size of interspace between the lunate and scaphoid may indicate ligamentous laxity and/or result of remote ligamentous injury. Minimal spur triquetrum. Moderate left first metacarpal phalangeal joint degenerative changes. No discrete osseous abnormality of the distal ulnar. No fracture or dislocation.  IMPRESSION: 1. Mild degenerative changes distal radial lunate articulation. 2. Slight increase size of interspace between the lunate and scaphoid may indicate ligamentous laxity and/or result of remote ligamentous injury. 3. Minimal spur triquetrum. 4. Moderate left first metacarpal phalangeal joint degenerative changes. 5. No discrete osseous abnormality of the distal ulnar.  Electronically Signed   By: Genia Del M.D.   On: 01/12/2018 08:37  Complexity Note: Imaging results reviewed. Results shared with Ms. Conley Canal, using Layman's terms.                         Meds   Current Outpatient Medications:  .  amphetamine-dextroamphetamine (ADDERALL XR) 30 MG 24 hr capsule, 2 q am, Disp: 60 capsule, Rfl: 0 .  clobetasol ointment (TEMOVATE) 8.29 %, Apply 1 application topically as needed. , Disp: , Rfl:  .  diazepam (VALIUM) 2 MG tablet, Take 1 tablet (2 mg total) by mouth  daily as needed for anxiety., Disp: 30 tablet, Rfl: 0 .  Diclofenac Sodium CR 100 MG 24 hr tablet, TAKE 1 TABLET BY MOUTH TWICE DAILY. TAKE WITH FOOD., Disp: 60 tablet, Rfl: 5 .  DULoxetine (CYMBALTA) 60 MG capsule, Take 2 capsules (120 mg total) by mouth daily., Disp: 180 capsule, Rfl: 0 .  lidocaine (XYLOCAINE) 2 % jelly, Apply 1 application topically 2 (two) times daily., Disp: 4 mL, Rfl: 1 .  lidocaine (XYLOCAINE) 2 % solution, TAKE 5 TO 10 MLS BY MOUTH TWICE  DAILY AS NEEDED., Disp: 100 mL, Rfl: 0 .  methocarbamol (ROBAXIN) 750 MG tablet, Take 1 tablet (750 mg total) by mouth every 8 (eight) hours as needed for muscle spasms., Disp: 90 tablet, Rfl: 0 .  nystatin (MYCOSTATIN/NYSTOP) powder, APPLY TO AFFECTED AREA(S) TOPICALLY TWICE DAILY., Disp: 45 g, Rfl: 6 .  omeprazole (PRILOSEC) 40 MG capsule, TAKE (1) CAPSULE BY MOUTH ONCE DAILY., Disp: 30 capsule, Rfl: 12 .  oxyCODONE-acetaminophen (PERCOCET) 10-325 MG tablet, Take 1 tablet by mouth every 8 (eight) hours as needed for pain., Disp: 90 tablet, Rfl: 0 .  sulfacetamide (BLEPH-10) 10 % ophthalmic solution, 2 drops to the affected eye qid for 3-5 days, Disp: 15 mL, Rfl: 0 .  traZODone (DESYREL) 100 MG tablet, 100-200 mg at night as needed for sleep, Disp: 180 tablet, Rfl: 0  ROS  Constitutional: Denies any fever or chills Gastrointestinal: No reported hemesis, hematochezia, vomiting, or acute GI distress Musculoskeletal: Denies any acute onset joint swelling, redness, loss of ROM, or weakness Neurological: No reported episodes of acute onset apraxia, aphasia, dysarthria, agnosia, amnesia, paralysis, loss of coordination, or loss of consciousness  Allergies  Ms. Starrett is allergic to wellbutrin [bupropion] and lyrica [pregabalin].  [place]  Drug: Ms. Victoria  reports no history of drug use. Alcohol:  reports current alcohol use. Tobacco:  reports that she quit smoking about 17 years ago. Her smoking use included cigarettes. She quit after  10.00 years of use. She has never used smokeless tobacco. Medical:  has a past medical history of ADHD (attention deficit hyperactivity disorder), Arthritis, Back pain, Broken femur ([place]) (08/27/12), Migraine, and Osteoarthritis. Surgical: Ms. Delcid  has a past surgical history that includes Total knee arthroplasty; Heel spur surgery; Back surgery; IM nailing femoral shaft fracture (Left, 08/27/2012); Femur IM nail (Left, 08/27/2012); Cervical biopsy w/ loop electrode excision (1990's); Endometrial biopsy (04-05-05 and 02-12-06); Tubal ligation; Shoulder surgery (Left); and Vaginal hysterectomy. Family: family history includes Bipolar disorder in her mother and sister; Breast cancer in her mother; Cancer in her father, mother, and another family member; Heart failure in her father and another family member.  Constitutional Exam  General appearance: Well nourished, well developed, and well hydrated. In no apparent acute distress Vitals:   02/26/18 1313  BP: 122/78  Pulse: 76  Resp: 18  Temp: 98.2 F (36.8 C)  TempSrc: Oral  SpO2: 97%  Weight: 245 lb (111.1 kg)  Height: '5\' 8"'$  (1.727 m)  Psych/Mental status: Alert, oriented x 3 (person, place, & time)       Eyes: PERLA Respiratory: No evidence of acute respiratory distress  Cervical Spine Area Exam  Skin & Axial Inspection: No masses, redness, edema, swelling, or associated skin lesions Alignment: Symmetrical Functional ROM: Unrestricted ROM      Stability: No instability detected Muscle Tone/Strength: Functionally intact. No obvious neuro-muscular anomalies detected. Sensory (Neurological): Unimpaired Palpation: No palpable anomalies              Upper Extremity (UE) Exam    Side: Right upper extremity  Side: Left upper extremity  Skin & Extremity Inspection: Skin color, temperature, and hair growth are WNL. No peripheral edema or cyanosis. No masses, redness, swelling, asymmetry, or associated skin lesions. No contractures.  Skin &  Extremity Inspection: Skin color, temperature, and hair growth are WNL. No peripheral edema or cyanosis. No masses, redness, swelling, asymmetry, or associated skin lesions. No contractures.  Functional ROM: Unrestricted ROM          Functional ROM: Unrestricted  ROM          Muscle Tone/Strength: Functionally intact. No obvious neuro-muscular anomalies detected.  Muscle Tone/Strength: Functionally intact. No obvious neuro-muscular anomalies detected.  Sensory (Neurological): Unimpaired          Sensory (Neurological): Unimpaired          Palpation: No palpable anomalies              Palpation: No palpable anomalies                   Thoracic Spine Area Exam  Skin & Axial Inspection: No masses, redness, or swelling Alignment: Symmetrical Functional ROM: Unrestricted ROM Stability: No instability detected Muscle Tone/Strength: Functionally intact. No obvious neuro-muscular anomalies detected. Sensory (Neurological): Unimpaired Muscle strength & Tone: No palpable anomalies  Lumbar Spine Area Exam  Skin & Axial Inspection: No masses, redness, or swelling Alignment: Symmetrical Functional ROM: Unrestricted ROM       Stability: No instability detected Muscle Tone/Strength: Functionally intact. No obvious neuro-muscular anomalies detected. Sensory (Neurological): Unimpaired Palpation: No palpable anomalies         Gait & Posture Assessment  Ambulation: Unassisted Gait: Relatively normal for age and body habitus Posture: WNL   Lower Extremity Exam    Side: Right lower extremity  Side: Left lower extremity  Stability: No instability observed          Stability: No instability observed          Skin & Extremity Inspection: Skin color, temperature, and hair growth are WNL. No peripheral edema or cyanosis. No masses, redness, swelling, asymmetry, or associated skin lesions. No contractures.  Skin & Extremity Inspection: Skin color, temperature, and hair growth are WNL. No peripheral edema or  cyanosis. No masses, redness, swelling, asymmetry, or associated skin lesions. No contractures.  Functional ROM: Unrestricted ROM                  Functional ROM: Unrestricted ROM                  Muscle Tone/Strength: Functionally intact. No obvious neuro-muscular anomalies detected.  Muscle Tone/Strength: Functionally intact. No obvious neuro-muscular anomalies detected.  Sensory (Neurological): Unimpaired        Sensory (Neurological): Unimpaired         Assessment  Primary Diagnosis & Pertinent Problem List: The primary encounter diagnosis was Lumbar spondylosis. Diagnoses of Chronic arthralgias of knees and hips, Lumbar facet arthropathy, Chronic pain syndrome, and Long term prescription opiate use were also pertinent to this visit.  Status Diagnosis  Controlled Controlled Controlled 1. Lumbar spondylosis   2. Chronic arthralgias of knees and hips   3. Lumbar facet arthropathy   4. Chronic pain syndrome   5. Long term prescription opiate use     Problems updated and reviewed during this visit: No problems updated. Plan of Care  Pharmacotherapy (Medications Ordered): Meds ordered this encounter  Medications  . methocarbamol (ROBAXIN) 750 MG tablet    Sig: Take 1 tablet (750 mg total) by mouth every 8 (eight) hours as needed for muscle spasms.    Dispense:  90 tablet    Refill:  0    Do not place this medication, or any other prescription from our practice, on "Automatic Refill". Patient may have prescription filled one day early if pharmacy is closed on scheduled refill date.    Order Specific Question:   Supervising Provider    Answer:   Milinda Pointer [phone]  . oxyCODONE-acetaminophen (PERCOCET) 10-325  MG tablet    Sig: Take 1 tablet by mouth every 8 (eight) hours as needed for pain.    Dispense:  90 tablet    Refill:  0    Do not add this medication to the electronic "Automatic Refill" notification system. Patient may have prescription filled one day early if  pharmacy is closed on scheduled refill date.    Order Specific Question:   Supervising Provider    Answer:   Milinda Pointer [[id number]]   New Prescriptions   No medications on file   Medications administered today: [place]. [place] had no medications administered during this visit. Lab-work, procedure(s), and/or referral(s): Orders Placed This Encounter  Procedures  . ToxASSURE Select 13 (MW), Urine   Imaging and/or referral(s): None  Interventional therapies: Planned, scheduled, and/or pending:   Not at this time.   Provider-requested follow-up: Return in about 22 days (around 03/20/2018) for MedMgmt.  Future Appointments  Date Time Provider [place]  03/24/2018  1:00 PM Vevelyn Francois, NP [facility] None  04/06/2018  3:00 PM Kathyrn Drown, MD [facility]  05/05/2018  3:00 PM Norman Clay, MD [facility] None  06/23/2018  2:00 PM Megan Salon, MD [place] None   Primary Care Physician: Kathyrn Drown, MD Location: [facility] Outpatient Pain Management Facility Note by: Vevelyn Francois NP Date: 02/26/2018; Time: 11:49 PM  Pain Score Disclaimer: We use the NRS-11 scale. This is a self-reported, subjective measurement of pain severity with only modest accuracy. It is used primarily to identify changes within a particular patient. It must be understood that outpatient pain scales are significantly less accurate that those used for research, where they can be applied under ideal controlled circumstances with minimal exposure to variables. In reality, the score is likely to be a combination of pain intensity and pain affect, where pain affect describes the degree of emotional arousal or changes in action readiness caused by the sensory experience of pain. Factors such as social and work situation, setting, emotional state, anxiety levels, expectation, and prior pain experience may influence pain perception and show large inter-individual differences that may also be affected by time  variables.  Patient instructions provided during this appointment: Patient Instructions   ____________________________________________________________________________________________  Medication Rules  Purpose: To inform patients, and their family members, of our rules and regulations.  Applies to: All patients receiving prescriptions (written or electronic).  Pharmacy of record: Pharmacy where electronic prescriptions will be sent. If written prescriptions are taken to a different pharmacy, please inform the nursing staff. The pharmacy listed in the electronic medical record should be the one where you would like electronic prescriptions to be sent.  Electronic prescriptions: In compliance with the [place] (STOP) Act of 2017 (Session Lanny Cramp [phone]), effective March 18, 2018, all controlled substances must be electronically prescribed. Calling prescriptions to the pharmacy will cease to exist.  Prescription refills: Only during scheduled appointments. Applies to all prescriptions.  NOTE: The following applies primarily to controlled substances (Opioid* Pain Medications).   Patient's responsibilities: 1. Pain Pills: Bring all pain pills to every appointment (except for procedure appointments). 2. Pill Bottles: Bring pills in original pharmacy bottle. Always bring the newest bottle. Bring bottle, even if empty. 3. Medication refills: You are responsible for knowing and keeping track of what medications you take and those you need refilled. The day before your appointment: write a list of all prescriptions that need to be refilled. The day of the appointment: give the  list to the admitting nurse. Prescriptions will be written only during appointments. If you forget a medication: it will not be "Called in", "Faxed", or "electronically sent". You will need to get another appointment to get these prescribed. No early refills. Do not call asking to  have your prescription filled early. 4. Prescription Accuracy: You are responsible for carefully inspecting your prescriptions before leaving our office. Have the discharge nurse carefully go over each prescription with you, before taking them home. Make sure that your name is accurately spelled, that your address is correct. Check the name and dose of your medication to make sure it is accurate. Check the number of pills, and the written instructions to make sure they are clear and accurate. Make sure that you are given enough medication to last until your next medication refill appointment. 5. Taking Medication: Take medication as prescribed. When it comes to controlled substances, taking less pills or less frequently than prescribed is permitted and encouraged. Never take more pills than instructed. Never take medication more frequently than prescribed.  6. Inform other Doctors: Always inform, all of your healthcare providers, of all the medications you take. 7. Pain Medication from other Providers: You are not allowed to accept any additional pain medication from any other Doctor or Healthcare provider. There are two exceptions to this rule. (see below) In the event that you require additional pain medication, you are responsible for notifying us, as stated below. 8. Medication Agreement: You are responsible for carefully reading and following our Medication Agreement. This must be signed before receiving any prescriptions from our practice. Safely store a copy of your signed Agreement. Violations to the Agreement will result in no further prescriptions. (Additional copies of our Medication Agreement are available upon request.) 9. Laws, Rules, & Regulations: All patients are expected to follow all Federal and [employer], [employer], Rules, [employer]. Ignorance of the Laws does not constitute a valid excuse. The use of any illegal substances is prohibited. 10. Adopted CDC guidelines & recommendations:  Target dosing levels will be at or below 60 MME/day. Use of benzodiazepines** is not recommended.  Exceptions: There are only two exceptions to the rule of not receiving pain medications from other Healthcare Providers. 1. Exception #1 (Emergencies): In the event of an emergency (i.e.: accident requiring emergency care), you are allowed to receive additional pain medication. However, you are responsible for: As soon as you are able, call our office [phone], at any time of the day or night, and leave a message stating your name, the date and nature of the emergency, and the name and dose of the medication prescribed. In the event that your call is answered by a member of our staff, make sure to document and save the date, time, and the name of the person that took your information.  2. Exception #2 (Planned Surgery): In the event that you are scheduled by another doctor or dentist to have any type of surgery or procedure, you are allowed (for a period no longer than 30 days), to receive additional pain medication, for the acute post-op pain. However, in this case, you are responsible for picking up a copy of our "Post-op Pain Management for Surgeons" handout, and giving it to your surgeon or dentist. This document is available at our office, and does not require an appointment to obtain it. Simply go to our office during business hours (Monday-Thursday from 8:00 AM to 4:00 PM) (Friday 8:00 AM to 12:00 Noon) or if you have  a scheduled appointment with [place], prior to your surgery, and ask for it by name. In addition, you will need to provide [place] with your name, name of your surgeon, type of surgery, and date of procedure or surgery.  *Opioid medications include: morphine, codeine, oxycodone, oxymorphone, hydrocodone, hydromorphone, meperidine, tramadol, tapentadol, buprenorphine, fentanyl, methadone. **Benzodiazepine medications include: diazepam (Valium), alprazolam (Xanax), clonazepam (Klonopine),  lorazepam (Ativan), clorazepate (Tranxene), chlordiazepoxide (Librium), estazolam (Prosom), oxazepam (Serax), temazepam (Restoril), triazolam (Halcion) (Last updated: 05/15/2017) ____________________________________________________________________________________________    BMI Assessment: Estimated body mass index is 37.25 kg/m as calculated from the following:   Height as of this encounter: '5\' 8"'$  (1.727 m).   Weight as of this encounter: 245 lb (111.1 kg).  BMI interpretation table: BMI level Category Range association with higher incidence of chronic pain  <18 kg/m2 Underweight   18.5-24.9 kg/m2 Ideal body weight   25-29.9 kg/m2 Overweight Increased incidence by 20%  30-34.9 kg/m2 Obese (Class I) Increased incidence by 68%  35-39.9 kg/m2 Severe obesity (Class II) Increased incidence by 136%  >40 kg/m2 Extreme obesity (Class III) Increased incidence by 254%   Patient's current BMI Ideal Body weight  Body mass index is 37.25 kg/m. Ideal body weight: 63.9 kg (140 lb 14 oz) Adjusted ideal body weight: 82.8 kg (182 lb 8.4 oz)   BMI Readings from Last 4 Encounters:  02/26/18 37.25 kg/m  01/21/18 38.06 kg/m  01/09/18 37.71 kg/m  12/01/17 36.49 kg/m   Wt Readings from Last 4 Encounters:  02/26/18 245 lb (111.1 kg)  01/21/18 243 lb (110.2 kg)  01/09/18 248 lb (112.5 kg)  12/01/17 240 lb (108.9 kg)  ALL MEDICATIONS WERE E-SCRIBED TO YOUR PHARMACY OF CHOICE.

## 2018-02-26 NOTE — Progress Notes (Signed)
Nursing Pain Medication Assessment:  Safety precautions to be maintained throughout the outpatient stay will include: orient to surroundings, keep bed in low position, maintain call bell within reach at all times, provide assistance with transfer out of bed and ambulation.  Medication Inspection Compliance: Pill count conducted under aseptic conditions, in front of the patient. Neither the pills nor the bottle was removed from the patient's sight at any time. Once count was completed pills were immediately returned to the patient in their original bottle.  Medication: Oxycodone IR Pill/Patch Count: 3 of 90 pills remain Pill/Patch Appearance: Markings consistent with prescribed medication Bottle Appearance: Standard pharmacy container. Clearly labeled. Filled Date: 3111 / 12 / 2019 Last Medication intake:  Today

## 2018-03-04 ENCOUNTER — Other Ambulatory Visit: Payer: Self-pay | Admitting: Family Medicine

## 2018-03-04 LAB — TOXASSURE SELECT 13 (MW), URINE

## 2018-03-24 ENCOUNTER — Encounter: Payer: Self-pay | Admitting: Nurse Practitioner

## 2018-03-30 ENCOUNTER — Other Ambulatory Visit: Payer: Self-pay

## 2018-03-30 ENCOUNTER — Encounter: Payer: Self-pay | Admitting: Nurse Practitioner

## 2018-03-30 ENCOUNTER — Ambulatory Visit: Payer: PPO | Attending: Nurse Practitioner | Admitting: Nurse Practitioner

## 2018-03-30 VITALS — BP 146/95 | HR 72 | Temp 97.7°F | Ht 68.0 in | Wt 240.0 lb

## 2018-03-30 DIAGNOSIS — Z79891 Long term (current) use of opiate analgesic: Secondary | ICD-10-CM | POA: Diagnosis not present

## 2018-03-30 DIAGNOSIS — M47816 Spondylosis without myelopathy or radiculopathy, lumbar region: Secondary | ICD-10-CM | POA: Insufficient documentation

## 2018-03-30 DIAGNOSIS — M5136 Other intervertebral disc degeneration, lumbar region: Secondary | ICD-10-CM | POA: Diagnosis not present

## 2018-03-30 DIAGNOSIS — G894 Chronic pain syndrome: Secondary | ICD-10-CM

## 2018-03-30 MED ORDER — METHOCARBAMOL 750 MG PO TABS: 750.0000 mg | ORAL_TABLET | Freq: Three times a day (TID) | ORAL | 0 refills | Status: DC | PRN

## 2018-03-30 MED ORDER — OXYCODONE-ACETAMINOPHEN 10-325 MG PO TABS: 1.0000 | ORAL_TABLET | Freq: Three times a day (TID) | ORAL | 0 refills | Status: DC | PRN

## 2018-03-30 NOTE — Progress Notes (Signed)
Patient's Name: Lauren Greene  MRN: [id number]  Referring Provider: Kathyrn Drown, MD  DOB: 12-Apr-1965  PCP: Kathyrn Drown, MD  DOS: 03/30/2018  Note by: Vevelyn Francois NP  Service setting: Ambulatory outpatient  Specialty: Interventional Pain Management  Location: [facility] (AMB) Pain Management Facility    Patient type: Established    Primary Reason(s) for Visit: Encounter for prescription drug management. (Level of risk: moderate)  CC: Back Pain  HPI  Lauren Greene is a 53 y.o. year old, female patient, who comes today for a medication management evaluation. 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 Her primarily concern today is the Back Pain  Pain Assessment: Location: Lower Back Radiating: denies Onset: More than a month ago Duration: Chronic pain Quality: Aching, Shooting, Burning, Numbness, Dull, Constant Severity: 6 /10 (subjective, self-reported pain score)  Note: Reported level is compatible with observation.                          Effect on ADL: limits my daily activities Timing: Constant Modifying factors: medications BP: (!) 146/95  HR: 72  Lauren Greene was last scheduled for an appointment on 02/26/2018 for medication management. During today's appointment we reviewed Lauren Greene's chronic pain status, as well as her outpatient medication  regimen. She does not feel like the interventional therapy are effective. She had relief for 10 days. She does not want to do this. She feels like this is a lot of money just for one week of relief. She denies any side effects of the medication.   The patient  reports no history of drug use. Her body mass index is 36.49 kg/m.  Further details on both, my assessment(s), as well as the proposed treatment plan, please see below.  Controlled Substance Pharmacotherapy Assessment REMS (Risk Evaluation and Mitigation Strategy)  Analgesic:Percocet 10 mg TID MME/day:40-73m/day. BChauncey Fischer RN  03/30/2018  2:48 PM  Sign when Signing Visit Nursing Pain Medication Assessment:  Safety precautions to be maintained throughout the outpatient stay will include: orient to surroundings, keep bed in low position, maintain call bell within reach at all times, provide assistance with transfer out of bed and ambulation.  Medication Inspection Compliance: Pill count conducted under aseptic conditions, in front of the patient. Neither the pills nor the bottle was removed from the patient's sight at any time. Once count was completed pills were immediately returned to the patient in their original bottle.  Medication: Oxycodone/APAP Pill/Patch Count: 3 of 90 pills remain Pill/Patch Appearance: Markings consistent with prescribed medication Bottle Appearance: Standard pharmacy container. Clearly labeled. Filled Date: 117/ 12 / 2019 Last Medication intake:  Today   Pharmacokinetics: Liberation and absorption (onset of action): WNL Distribution (time to peak effect): WNL Metabolism and excretion (duration of action): WNL         Pharmacodynamics: Desired effects: Analgesia: Ms. SManocchioreports >50% benefit. Functional ability: Patient reports that medication allows her to accomplish basic ADLs Clinically meaningful improvement in function (CMIF): Sustained  CMIF goals met Perceived effectiveness:  Described as relatively effective, allowing for increase in activities of daily living (ADL) Undesirable effects: Side-effects or Adverse reactions: None reported Monitoring: [place] PMP: Online review of the past 72-monthperiod conducted. Compliant with practice rules and regulations Last UDS on record: Summary  Date Value Ref Range Status  02/26/2018 FINAL  Final    Comment:    ==================================================================== TOXASSURE SELECT 13 (MW) ==================================================================== Test                             Result       Flag       Units Drug Present and Declared for Prescription Verification   Amphetamine                    >3984        EXPECTED   ng/mg creat    Amphetamine is available as a schedule II prescription drug.   Oxycodone                      3905         EXPECTED   ng/mg creat   Oxymorphone                    2161         EXPECTED   ng/mg creat   Noroxycodone                   >3984        EXPECTED   ng/mg creat   Noroxymorphone                 324          EXPECTED   ng/mg creat    Sources of oxycodone are scheduled prescription medications.    Oxymorphone, noroxycodone, and noroxymorphone are expected    metabolites of oxycodone. Oxymorphone is also available as a    scheduled prescription medication. Drug Absent but Declared for Prescription Verification   Diazepam                       Not Detected UNEXPECTED ng/mg creat ==================================================================== Test                      Result    Flag   Units      Ref Range   Creatinine              251              mg/dL      >=20 ==================================================================== Declared Medications:  The flagging and interpretation on this report are based on the  following declared medications.  Unexpected results may arise from  inaccuracies in the declared medications.  **Note: The testing scope of  this panel includes these medications:  Amphetamine (Adderall)  Diazepam  Oxycodone (Oxycodone Acetaminophen)  **Note: The testing scope of this panel does not include following  reported medications:  Acetaminophen (Oxycodone Acetaminophen)  Clobetasol  Diclofenac  Duloxetine  Eye Drop  Lidocaine  Methocarbamol  Nystatin  Omeprazole  Trazodone ==================================================================== For clinical consultation, please call ([phone] ====================================================================    UDS interpretation: Compliant          Medication Assessment Form: Reviewed. Patient indicates being compliant with therapy Treatment compliance: Compliant Risk Assessment Profile: Aberrant behavior: See prior evaluations. None observed or  detected today Comorbid factors increasing risk of overdose: See prior notes. No additional risks detected today Opioid risk tool (ORT) (Total Score): 2 Personal History of Substance Abuse (SUD-Substance use disorder):  Alcohol: Negative  Illegal Drugs: Negative  Rx Drugs: Negative  ORT Risk Level calculation: Low Risk Risk of substance use disorder (SUD): Moderate-to-High Opioid Risk Tool - 03/30/18 1458      Family History of Substance Abuse   Alcohol  Negative    Illegal Drugs  Negative    Rx Drugs  Negative      Personal History of Substance Abuse   Alcohol  Negative    Illegal Drugs  Negative    Rx Drugs  Negative      Age   Age between 47-45 years   No      History of Preadolescent Sexual Abuse   History of Preadolescent Sexual Abuse  Negative or Female      Psychological Disease   Psychological Disease  Positive    ADD  Positive    OCD  Negative    Bipolar  Negative    Schizophrenia  Negative    Depression  Negative      Total Score   Opioid Risk Tool Scoring  2    Opioid Risk Interpretation  Low Risk      ORT Scoring interpretation table:  Score <3 = Low Risk for SUD  Score  between 4-7 = Moderate Risk for SUD  Score >8 = High Risk for Opioid Abuse   Risk Mitigation Strategies:  Patient Counseling: Covered Patient-Prescriber Agreement (PPA): Present and active  Notification to other healthcare providers: Done  Pharmacologic Plan: No change in therapy, at this time.             Laboratory Chemistry  Inflammation Markers (CRP: Acute Phase) (ESR: Chronic Phase) Lab Results  Component Value Date   CRP 1.7 08/03/2014   ESRSEDRATE 2 08/03/2014                         Rheumatology Markers Lab Results  Component Value Date   RF 12 06/24/2013   ANA Negative 08/03/2014                        Renal Function Markers Lab Results  Component Value Date   BUN 16 01/09/2018   CREATININE 0.70 01/09/2018   BCR 23 01/09/2018   GFRAA 114 01/09/2018   GFRNONAA 99 01/09/2018                             Hepatic Function Markers Lab Results  Component Value Date   AST 26 03/15/2017   ALT 26 03/15/2017   ALBUMIN 4.3 03/15/2017   ALKPHOS 117 03/15/2017   LIPASE 25 01/12/2016                        Electrolytes Lab Results  Component Value Date   NA 142 01/09/2018   K 4.5 01/09/2018   CL 103 01/09/2018   CALCIUM 9.2 01/09/2018                        Neuropathy Markers Lab Results  Component Value Date   HGBA1C 5.6 07/22/2014  CNS Tests No results found for: COLORCSF, APPEARCSF, RBCCOUNTCSF, WBCCSF, POLYSCSF, LYMPHSCSF, EOSCSF, PROTEINCSF, GLUCCSF, JCVIRUS, CSFOLI, IGGCSF                      Bone Pathology Markers Lab Results  Component Value Date   VD25OH 31 01/07/2013                         Coagulation Parameters Lab Results  Component Value Date   INR 1.04 02/16/2013   LABPROT 13.6 02/16/2013   APTT 29 02/16/2013   PLT 274 01/12/2016                        Cardiovascular Markers Lab Results  Component Value Date   CKTOTAL 139 08/03/2014   CKMB 2.8 06/24/2013   HGB 14.6 01/12/2016   HCT 43.4 01/12/2016                          CA Markers No results found for: CEA, CA125, LABCA2                      Note: Lab results reviewed.  Recent Diagnostic Imaging Results  DG Wrist Complete Left CLINICAL DATA:  53 year old female. No injury. Increasing left wrist pain around distal ulna. Initial encounter.  EXAM: LEFT WRIST - COMPLETE 3+ VIEW  COMPARISON:  01/30/2009 left thumb MR.  FINDINGS: Mild degenerative changes distal radial lunate articulation. Slight increase size of interspace between the lunate and scaphoid may indicate ligamentous laxity and/or result of remote ligamentous injury. Minimal spur triquetrum. Moderate left first metacarpal phalangeal joint degenerative changes. No discrete osseous abnormality of the distal ulnar. No fracture or dislocation.  IMPRESSION: 1. Mild degenerative changes distal radial lunate articulation. 2. Slight increase size of interspace between the lunate and scaphoid may indicate ligamentous laxity and/or result of remote ligamentous injury. 3. Minimal spur triquetrum. 4. Moderate left first metacarpal phalangeal joint degenerative changes. 5. No discrete osseous abnormality of the distal ulnar.  Electronically Signed   By: Genia Del M.D.   On: 01/12/2018 08:37  Complexity Note: Imaging results reviewed. Results shared with Lauren Greene, using Layman's terms.                         Meds   Current Outpatient Medications:  .  amphetamine-dextroamphetamine (ADDERALL XR) 30 MG 24 hr capsule, 2 q am, Disp: 60 capsule, Rfl: 0 .  clobetasol ointment (TEMOVATE) 1.93 %, Apply 1 application topically as needed. , Disp: , Rfl:  .  diazepam (VALIUM) 2 MG tablet, Take 1 tablet (2 mg total) by mouth daily as needed for anxiety., Disp: 30 tablet, Rfl: 0 .  Diclofenac Sodium CR 100 MG 24 hr tablet, TAKE 1 TABLET BY MOUTH TWICE DAILY. TAKE WITH FOOD., Disp: 60 tablet, Rfl: 4 .  DULoxetine (CYMBALTA) 60 MG capsule, Take 2 capsules (120 mg total) by  mouth daily., Disp: 180 capsule, Rfl: 0 .  lidocaine (XYLOCAINE) 2 % jelly, Apply 1 application topically 2 (two) times daily., Disp: 4 mL, Rfl: 1 .  lidocaine (XYLOCAINE) 2 % solution, TAKE 5 TO 10 MLS BY MOUTH TWICE DAILY AS NEEDED., Disp: 100 mL, Rfl: 0 .  nystatin (MYCOSTATIN/NYSTOP) powder, APPLY TO AFFECTED AREA(S) TOPICALLY TWICE DAILY., Disp: 45 g, Rfl: 6 .  omeprazole (PRILOSEC) 40 MG capsule, TAKE (1) CAPSULE BY  MOUTH ONCE DAILY., Disp: 30 capsule, Rfl: 12 .  sulfacetamide (BLEPH-10) 10 % ophthalmic solution, 2 drops to the affected eye qid for 3-5 days, Disp: 15 mL, Rfl: 0 .  sulfamethoxazole-trimethoprim (BACTRIM DS,SEPTRA DS) 800-160 MG tablet, Take 1 tablet by mouth 2 (two) times daily., Disp: , Rfl:  .  traZODone (DESYREL) 100 MG tablet, 100-200 mg at night as needed for sleep, Disp: 180 tablet, Rfl: 0 .  methocarbamol (ROBAXIN) 750 MG tablet, Take 1 tablet (750 mg total) by mouth every 8 (eight) hours as needed for up to 30 days for muscle spasms., Disp: 90 tablet, Rfl: 0 .  oxyCODONE-acetaminophen (PERCOCET) 10-325 MG tablet, Take 1 tablet by mouth every 8 (eight) hours as needed for up to 30 days for pain., Disp: 90 tablet, Rfl: 0  ROS  Constitutional: Denies any fever or chills Gastrointestinal: No reported hemesis, hematochezia, vomiting, or acute GI distress Musculoskeletal: Denies any acute onset joint swelling, redness, loss of ROM, or weakness Neurological: No reported episodes of acute onset apraxia, aphasia, dysarthria, agnosia, amnesia, paralysis, loss of coordination, or loss of consciousness  Allergies  Lauren Greene is allergic to wellbutrin [bupropion] and lyrica [pregabalin].  [place]  Drug: Lauren Greene  reports no history of drug use. Alcohol:  reports current alcohol use. Tobacco:  reports that she quit smoking about 17 years ago. Her smoking use included cigarettes. She quit after 10.00 years of use. She has never used smokeless tobacco. Medical:  has a past  medical history of ADHD (attention deficit hyperactivity disorder), Arthritis, Back pain, Broken femur ([place]) (08/27/12), Migraine, and Osteoarthritis. Surgical: Lauren Greene  has a past surgical history that includes Total knee arthroplasty; Heel spur surgery; Back surgery; IM nailing femoral shaft fracture (Left, 08/27/2012); Femur IM nail (Left, 08/27/2012); Cervical biopsy w/ loop electrode excision (1990's); Endometrial biopsy (04-05-05 and 02-12-06); Tubal ligation; Shoulder surgery (Left); and Vaginal hysterectomy. Family: family history includes Bipolar disorder in her mother and sister; Breast cancer in her mother; Cancer in her father, mother, and another family member; Heart failure in her father and another family member.  Constitutional Exam  General appearance: Well nourished, well developed, and well hydrated. In no apparent acute distress Vitals:   03/30/18 1452  BP: (!) 146/95  Pulse: 72  Temp: 97.7 F (36.5 C)  SpO2: 100%  Weight: 240 lb (108.9 kg)  Height: 5' 8" (1.727 m)   Psych/Mental status: Alert, oriented x 3 (person, place, & time)       Eyes: PERLA Respiratory: No evidence of acute respiratory distress   Lumbar Spine Area Exam  Skin & Axial Inspection: No masses, redness, or swelling Alignment: Symmetrical Functional ROM: Unrestricted ROM       Stability: No instability detected Muscle Tone/Strength: Functionally intact. No obvious neuro-muscular anomalies detected. Sensory (Neurological): Unimpaired Palpation: No palpable anomalies       Provocative Tests: Hyperextension/rotation test: deferred today       Lumbar quadrant test (Kemp's test): deferred today       Lateral bending test: deferred today       Patrick's Maneuver: deferred today                     Gait & Posture Assessment  Ambulation: Unassisted Gait: Relatively normal for age and body habitus Posture: WNL   Lower Extremity Exam    Side: Right lower extremity  Side: Left lower extremity   Stability: No instability observed  Stability: No instability observed          Skin & Extremity Inspection: Skin color, temperature, and hair growth are WNL. No peripheral edema or cyanosis. No masses, redness, swelling, asymmetry, or associated skin lesions. No contractures.  Skin & Extremity Inspection: Skin color, temperature, and hair growth are WNL. No peripheral edema or cyanosis. No masses, redness, swelling, asymmetry, or associated skin lesions. No contractures.  Functional ROM: Unrestricted ROM                  Functional ROM: Unrestricted ROM                  Muscle Tone/Strength: Functionally intact. No obvious neuro-muscular anomalies detected.  Muscle Tone/Strength: Functionally intact. No obvious neuro-muscular anomalies detected.  Sensory (Neurological): Unimpaired        Sensory (Neurological): Unimpaired                 Assessment  Primary Diagnosis & Pertinent Problem List: The primary encounter diagnosis was Lumbar spondylosis. Diagnoses of Lumbar facet arthropathy, Lumbar degenerative disc disease, Chronic pain syndrome, and Long term current use of opiate analgesic were also pertinent to this visit.  Status Diagnosis  Persistent Persistent Persistent 1. Lumbar spondylosis   2. Lumbar facet arthropathy   3. Lumbar degenerative disc disease   4. Chronic pain syndrome   5. Long term current use of opiate analgesic     Problems updated and reviewed during this visit: Problem  Long Term Current Use of Opiate Analgesic  Marijuana Use   Plan of Care  Pharmacotherapy (Medications Ordered): Meds ordered this encounter  Medications  . methocarbamol (ROBAXIN) 750 MG tablet    Sig: Take 1 tablet (750 mg total) by mouth every 8 (eight) hours as needed for up to 30 days for muscle spasms.    Dispense:  90 tablet    Refill:  0    Do not place this medication, or any other prescription from our practice, on "Automatic Refill". Patient may have prescription filled  one day early if pharmacy is closed on scheduled refill date.    Order Specific Question:   Supervising Provider    Answer:   Milinda Pointer [phone]  . oxyCODONE-acetaminophen (PERCOCET) 10-325 MG tablet    Sig: Take 1 tablet by mouth every 8 (eight) hours as needed for up to 30 days for pain.    Dispense:  90 tablet    Refill:  0    Do not add this medication to the electronic "Automatic Refill" notification system. Patient may have prescription filled one day early if pharmacy is closed on scheduled refill date.    Order Specific Question:   Supervising Provider    Answer:   Milinda Pointer [[id number]]   New Prescriptions   No medications on file   Medications administered today: Lauren Greene. Lauren Greene had no medications administered during this visit. Lab-work, procedure(s), and/or referral(s): No orders of the defined types were placed in this encounter.  Imaging and/or referral(s): None  Interventional therapies: Planned, scheduled, and/or pending:   Not at this time.   Provider-requested follow-up: Return in about 4 weeks (around 04/27/2018) for MedMgmt.  Future Appointments  Date Time Provider [place]  04/06/2018  3:00 PM Kathyrn Drown, MD [facility]  04/28/2018  2:30 PM Vevelyn Francois, NP [facility] None  05/05/2018  3:00 PM Norman Clay, MD [facility] None  06/23/2018  2:00 PM Megan Salon, MD [place] None   Primary Care Physician:  Kathyrn Drown, MD Location: [facility] Facility Note by: Vevelyn Francois NP Date: 03/30/2018; Time: 3:32 PM  Pain Score Disclaimer: We use the NRS-11 scale. This is a self-reported, subjective measurement of pain severity with only modest accuracy. It is used primarily to identify changes within a particular patient. It must be understood that outpatient pain scales are significantly less accurate that those used for research, where they can be applied under ideal controlled circumstances with minimal  exposure to variables. In reality, the score is likely to be a combination of pain intensity and pain affect, where pain affect describes the degree of emotional arousal or changes in action readiness caused by the sensory experience of pain. Factors such as social and work situation, setting, emotional state, anxiety levels, expectation, and prior pain experience may influence pain perception and show large inter-individual differences that may also be affected by time variables.  Patient instructions provided during this appointment: Patient Instructions   ____________________________________________________________________________________________  Medication Rules  Purpose: To inform patients, and their family members, of our rules and regulations.  Applies to: All patients receiving prescriptions (written or electronic).  Pharmacy of record: Pharmacy where electronic prescriptions will be sent. If written prescriptions are taken to a different pharmacy, please inform the nursing staff. The pharmacy listed in the electronic medical record should be the one where you would like electronic prescriptions to be sent.  Electronic prescriptions: In compliance with the [place] (STOP) Act of 2017 (Session Lanny Cramp [phone]), effective March 18, 2018, all controlled substances must be electronically prescribed. Calling prescriptions to the pharmacy will cease to exist.  Prescription refills: Only during scheduled appointments. Applies to all prescriptions.  NOTE: The following applies primarily to controlled substances (Opioid* Pain Medications).   Patient's responsibilities: 1. Pain Pills: Bring all pain pills to every appointment (except for procedure appointments). 2. Pill Bottles: Bring pills in original pharmacy bottle. Always bring the newest bottle. Bring bottle, even if empty. 3. Medication refills: You are responsible for knowing and keeping track  of what medications you take and those you need refilled. The day before your appointment: write a list of all prescriptions that need to be refilled. The day of the appointment: give the list to the admitting nurse. Prescriptions will be written only during appointments. If you forget a medication: it will not be "Called in", "Faxed", or "electronically sent". You will need to get another appointment to get these prescribed. No early refills. Do not call asking to have your prescription filled early. 4. Prescription Accuracy: You are responsible for carefully inspecting your prescriptions before leaving our office. Have the discharge nurse carefully go over each prescription with you, before taking them home. Make sure that your name is accurately spelled, that your address is correct. Check the name and dose of your medication to make sure it is accurate. Check the number of pills, and the written instructions to make sure they are clear and accurate. Make sure that you are given enough medication to last until your next medication refill appointment. 5. Taking Medication: Take medication as prescribed. When it comes to controlled substances, taking less pills or less frequently than prescribed is permitted and encouraged. Never take more pills than instructed. Never take medication more frequently than prescribed.  6. Inform other Doctors: Always inform, all of your healthcare providers, of all the medications you take. 7. Pain Medication from other Providers: You are not allowed to accept any additional pain medication  from any other Doctor or Healthcare provider. There are two exceptions to this rule. (see below) In the event that you require additional pain medication, you are responsible for notifying us, as stated below. 8. Medication Agreement: You are responsible for carefully reading and following our Medication Agreement. This must be signed before receiving any prescriptions from our  practice. Safely store a copy of your signed Agreement. Violations to the Agreement will result in no further prescriptions. (Additional copies of our Medication Agreement are available upon request.) 9. Laws, Rules, & Regulations: All patients are expected to follow all Federal and [employer], Rules, [employer]. Ignorance of the Laws does not constitute a valid excuse. The use of any illegal substances is prohibited. 10. Adopted CDC guidelines & recommendations: Target dosing levels will be at or below 60 MME/day. Use of benzodiazepines** is not recommended.  Exceptions: There are only two exceptions to the rule of not receiving pain medications from other Healthcare Providers. 1. Exception #1 (Emergencies): In the event of an emergency (i.e.: accident requiring emergency care), you are allowed to receive additional pain medication. However, you are responsible for: As soon as you are able, call our office [phone], at any time of the day or night, and leave a message stating your name, the date and nature of the emergency, and the name and dose of the medication prescribed. In the event that your call is answered by a member of our staff, make sure to document and save the date, time, and the name of the person that took your information.  2. Exception #2 (Planned Surgery): In the event that you are scheduled by another doctor or dentist to have any type of surgery or procedure, you are allowed (for a period no longer than 30 days), to receive additional pain medication, for the acute post-op pain. However, in this case, you are responsible for picking up a copy of our "Post-op Pain Management for Surgeons" handout, and giving it to your surgeon or dentist. This document is available at our office, and does not require an appointment to obtain it. Simply go to our office during business hours (Monday-Thursday from 8:00 AM to 4:00 PM) (Friday 8:00 AM to 12:00 Noon) or if you have a  scheduled appointment with [place], prior to your surgery, and ask for it by name. In addition, you will need to provide [place] with your name, name of your surgeon, type of surgery, and date of procedure or surgery.  *Opioid medications include: morphine, codeine, oxycodone, oxymorphone, hydrocodone, hydromorphone, meperidine, tramadol, tapentadol, buprenorphine, fentanyl, methadone. **Benzodiazepine medications include: diazepam (Valium), alprazolam (Xanax), clonazepam (Klonopine), lorazepam (Ativan), clorazepate (Tranxene), chlordiazepoxide (Librium), estazolam (Prosom), oxazepam (Serax), temazepam (Restoril), triazolam (Halcion) (Last updated: 05/15/2017) ____________________________________________________________________________________________   BMI Assessment: Estimated body mass index is 37.25 kg/m as calculated from the following:   Height as of 02/26/18: 5' 8" (1.727 m).   Weight as of 02/26/18: 245 lb (111.1 kg).  BMI interpretation table: BMI level Category Range association with higher incidence of chronic pain  <18 kg/m2 Underweight   18.5-24.9 kg/m2 Ideal body weight   25-29.9 kg/m2 Overweight Increased incidence by 20%  30-34.9 kg/m2 Obese (Class I) Increased incidence by 68%  35-39.9 kg/m2 Severe obesity (Class II) Increased incidence by 136%  >40 kg/m2 Extreme obesity (Class III) Increased incidence by 254%   Patient's current BMI Ideal Body weight  There is no height or weight on file to calculate BMI. Patient weight not recorded   BMI Readings  from Last 4 Encounters:  02/26/18 37.25 kg/m  01/21/18 38.06 kg/m  01/09/18 37.71 kg/m  12/01/17 36.49 kg/m   Wt Readings from Last 4 Encounters:  02/26/18 245 lb (111.1 kg)  01/21/18 243 lb (110.2 kg)  01/09/18 248 lb (112.5 kg)  12/01/17 240 lb (108.9 kg)

## 2018-03-30 NOTE — Progress Notes (Signed)
Nursing Pain Medication Assessment:  Safety precautions to be maintained throughout the outpatient stay will include: orient to surroundings, keep bed in low position, maintain call bell within reach at all times, provide assistance with transfer out of bed and ambulation.  Medication Inspection Compliance: Pill count conducted under aseptic conditions, in front of the patient. Neither the pills nor the bottle was removed from the patient's sight at any time. Once count was completed pills were immediately returned to the patient in their original bottle.  Medication: Oxycodone/APAP Pill/Patch Count: 3 of 90 pills remain Pill/Patch Appearance: Markings consistent with prescribed medication Bottle Appearance: Standard pharmacy container. Clearly labeled. Filled Date: 7 / 12 / 2019 Last Medication intake:  Today

## 2018-03-30 NOTE — Patient Instructions (Addendum)
____________________________________________________________________________________________  Medication Rules  Purpose: To inform patients, and their family members, of our rules and regulations.  Applies to: All patients receiving prescriptions (written or electronic).  Pharmacy of record: Pharmacy where electronic prescriptions will be sent. If written prescriptions are taken to a different pharmacy, please inform the nursing staff. The pharmacy listed in the electronic medical record should be the one where you would like electronic prescriptions to be sent.  Electronic prescriptions: In compliance with the [place] Strengthen Opioid Misuse Prevention (STOP) Act of 2017 (Session Law 2017-74/H243), effective March 18, 2018, all controlled substances must be electronically prescribed. Calling prescriptions to the pharmacy will cease to exist.  Prescription refills: Only during scheduled appointments. Applies to all prescriptions.  NOTE: The following applies primarily to controlled substances (Opioid* Pain Medications).   Patient's responsibilities: 1. Pain Pills: Bring all pain pills to every appointment (except for procedure appointments). 2. Pill Bottles: Bring pills in original pharmacy bottle. Always bring the newest bottle. Bring bottle, even if empty. 3. Medication refills: You are responsible for knowing and keeping track of what medications you take and those you need refilled. The day before your appointment: write a list of all prescriptions that need to be refilled. The day of the appointment: give the list to the admitting nurse. Prescriptions will be written only during appointments. If you forget a medication: it will not be "Called in", "Faxed", or "electronically sent". You will need to get another appointment to get these prescribed. No early refills. Do not call asking to have your prescription filled early. 4. Prescription Accuracy: You are responsible for  carefully inspecting your prescriptions before leaving our office. Have the discharge nurse carefully go over each prescription with you, before taking them home. Make sure that your name is accurately spelled, that your address is correct. Check the name and dose of your medication to make sure it is accurate. Check the number of pills, and the written instructions to make sure they are clear and accurate. Make sure that you are given enough medication to last until your next medication refill appointment. 5. Taking Medication: Take medication as prescribed. When it comes to controlled substances, taking less pills or less frequently than prescribed is permitted and encouraged. Never take more pills than instructed. Never take medication more frequently than prescribed.  6. Inform other Doctors: Always inform, all of your healthcare providers, of all the medications you take. 7. Pain Medication from other Providers: You are not allowed to accept any additional pain medication from any other Doctor or Healthcare provider. There are two exceptions to this rule. (see below) In the event that you require additional pain medication, you are responsible for notifying us, as stated below. 8. Medication Agreement: You are responsible for carefully reading and following our Medication Agreement. This must be signed before receiving any prescriptions from our practice. Safely store a copy of your signed Agreement. Violations to the Agreement will result in no further prescriptions. (Additional copies of our Medication Agreement are available upon request.) 9. Laws, Rules, & Regulations: All patients are expected to follow all Federal and State Laws, Statutes, Rules, & Regulations. Ignorance of the Laws does not constitute a valid excuse. The use of any illegal substances is prohibited. 10. Adopted CDC guidelines & recommendations: Target dosing levels will be at or below 60 MME/day. Use of benzodiazepines** is not  recommended.  Exceptions: There are only two exceptions to the rule of not receiving pain medications from other Healthcare Providers. 1.   Exception #1 (Emergencies): In the event of an emergency (i.e.: accident requiring emergency care), you are allowed to receive additional pain medication. However, you are responsible for: As soon as you are able, call our office [phone], at any time of the day or night, and leave a message stating your name, the date and nature of the emergency, and the name and dose of the medication prescribed. In the event that your call is answered by a member of our staff, make sure to document and save the date, time, and the name of the person that took your information.  2. Exception #2 (Planned Surgery): In the event that you are scheduled by another doctor or dentist to have any type of surgery or procedure, you are allowed (for a period no longer than 30 days), to receive additional pain medication, for the acute post-op pain. However, in this case, you are responsible for picking up a copy of our "Post-op Pain Management for Surgeons" handout, and giving it to your surgeon or dentist. This document is available at our office, and does not require an appointment to obtain it. Simply go to our office during business hours (Monday-Thursday from 8:00 AM to 4:00 PM) (Friday 8:00 AM to 12:00 Noon) or if you have a scheduled appointment with [place], prior to your surgery, and ask for it by name. In addition, you will need to provide [place] with your name, name of your surgeon, type of surgery, and date of procedure or surgery.  *Opioid medications include: morphine, codeine, oxycodone, oxymorphone, hydrocodone, hydromorphone, meperidine, tramadol, tapentadol, buprenorphine, fentanyl, methadone. **Benzodiazepine medications include: diazepam (Valium), alprazolam (Xanax), clonazepam (Klonopine), lorazepam (Ativan), clorazepate (Tranxene), chlordiazepoxide (Librium), estazolam (Prosom),  oxazepam (Serax), temazepam (Restoril), triazolam (Halcion) (Last updated: 05/15/2017) ____________________________________________________________________________________________   BMI Assessment: Estimated body mass index is 37.25 kg/m as calculated from the following:   Height as of 02/26/18: 5\' 8"  (1.727 m).   Weight as of 02/26/18: 245 lb (111.1 kg).  BMI interpretation table: BMI level Category Range association with higher incidence of chronic pain  <18 kg/m2 Underweight   18.5-24.9 kg/m2 Ideal body weight   25-29.9 kg/m2 Overweight Increased incidence by 20%  30-34.9 kg/m2 Obese (Class I) Increased incidence by 68%  35-39.9 kg/m2 Severe obesity (Class II) Increased incidence by 136%  >40 kg/m2 Extreme obesity (Class III) Increased incidence by 254%   Patient's current BMI Ideal Body weight  There is no height or weight on file to calculate BMI. Patient weight not recorded   BMI Readings from Last 4 Encounters:  02/26/18 37.25 kg/m  01/21/18 38.06 kg/m  01/09/18 37.71 kg/m  12/01/17 36.49 kg/m   Wt Readings from Last 4 Encounters:  02/26/18 245 lb (111.1 kg)  01/21/18 243 lb (110.2 kg)  01/09/18 248 lb (112.5 kg)  12/01/17 240 lb (108.9 kg)

## 2018-04-01 DIAGNOSIS — T691XXA Chilblains, initial encounter: Secondary | ICD-10-CM

## 2018-04-01 DIAGNOSIS — L03031 Cellulitis of right toe: Secondary | ICD-10-CM | POA: Insufficient documentation

## 2018-04-01 DIAGNOSIS — T691XXD Chilblains, subsequent encounter: Secondary | ICD-10-CM | POA: Insufficient documentation

## 2018-04-01 HISTORY — DX: Chilblains, initial encounter: T69.1XXA

## 2018-04-06 ENCOUNTER — Telehealth: Payer: Self-pay | Admitting: *Deleted

## 2018-04-06 ENCOUNTER — Encounter: Payer: Self-pay | Admitting: Family Medicine

## 2018-04-06 ENCOUNTER — Ambulatory Visit (INDEPENDENT_AMBULATORY_CARE_PROVIDER_SITE_OTHER): Payer: PPO | Admitting: Family Medicine

## 2018-04-06 VITALS — BP 136/88 | Wt 248.0 lb

## 2018-04-06 DIAGNOSIS — F988 Other specified behavioral and emotional disorders with onset usually occurring in childhood and adolescence: Secondary | ICD-10-CM | POA: Diagnosis not present

## 2018-04-06 DIAGNOSIS — N3281 Overactive bladder: Secondary | ICD-10-CM

## 2018-04-06 DIAGNOSIS — I7300 Raynaud's syndrome without gangrene: Secondary | ICD-10-CM | POA: Diagnosis not present

## 2018-04-06 HISTORY — DX: Overactive bladder: N32.81

## 2018-04-06 HISTORY — DX: Raynaud's syndrome without gangrene: I73.00

## 2018-04-06 MED ORDER — OXYBUTYNIN CHLORIDE 10 % TD GEL: TRANSDERMAL | 6 refills | Status: DC

## 2018-04-06 MED ORDER — AMPHETAMINE-DEXTROAMPHET ER 30 MG PO CP24: ORAL_CAPSULE | ORAL | 0 refills | Status: DC

## 2018-04-06 MED ORDER — AMLODIPINE BESYLATE 2.5 MG PO TABS: 2.5000 mg | ORAL_TABLET | Freq: Every day | ORAL | 3 refills | Status: DC

## 2018-04-06 NOTE — Telephone Encounter (Signed)
Pt in today and wanted to get rx for 2nd shingles vaccine. Is it ok to give with her age. If so would like it faxed to [facility].

## 2018-04-06 NOTE — Progress Notes (Signed)
Subjective:    Patient ID: Lauren Greene, female    DOB: 09/27/1965, 53 y.o.   MRN: [id number]  HPI Patient was seen today for ADD checkup.  This patient does have ADD.  Patient takes medications for this.  If this does help control overall symptoms.  Please see below. -weight, vital signs reviewed.  The following items were covered. -Compliance with medication : Adderall XR takes 2 every morning  -Problems with completing homework, paying attention/taking good notes in school: n/a  -grades: n/a  - Eating patterns : eats well; not as much as she used to   -sleeping: dx with problems with circulation in toes; not sleeping well  -Additional issues or questions: pt would like to speak with provider about her new diagnosis regarding toes; Raynodes disease she relates that her specialist told her she might want to consider using amlodipine She denies any gangrene but she states that they are concerned that she may lose 1 of her toes In the previous past she has had ABI which showed good vasculature  Pt states she would like refill on Gelnbuque gel 1% patient uses this for overactive bladder she relates that the oral O overactive bladder medicine causes nausea and drowsiness   Review of Systems  Constitutional: Negative for activity change, fatigue and fever.  HENT: Negative for congestion and rhinorrhea.   Respiratory: Negative for cough, chest tightness and shortness of breath.   Cardiovascular: Negative for chest pain and leg swelling.  Gastrointestinal: Negative for abdominal pain and nausea.  Musculoskeletal: Positive for arthralgias.  Skin: Negative for color change.  Neurological: Negative for dizziness and headaches.  Psychiatric/Behavioral: Negative for agitation and behavioral problems.       Objective:   Physical Exam Vitals signs reviewed.  Constitutional:      General: She is not in acute distress. HENT:     Head: Normocephalic.  Cardiovascular:     Rate and  Rhythm: Normal rate and regular rhythm.     Heart sounds: Normal heart sounds. No murmur.  Pulmonary:     Effort: Pulmonary effort is normal.     Breath sounds: Normal breath sounds.  Lymphadenopathy:     Cervical: No cervical adenopathy.  Neurological:     Mental Status: She is alert.  Psychiatric:        Behavior: Behavior normal.           Assessment & Plan:  Adult ADD Drug registry checked 3 scripts were sent in Follow-up if progressive troubles Recheck in 3 months  Overactive bladder patient states that the oral medications interfered with her blood pressure if she could not tolerate them her gynecologist have her on a topical medicine she asked for refills we went ahead and sent this in It is possible this will not be covered by her insurance and is possible we may need to try to get a prior approval afterwards  Raynaud syndrome- she has pretty severe case with her feet.  There is a possibility of underlying rheumatologic issue with this we will repeat rheumatologic testing.  She is being [facility].  We will go ahead and start amlodipine 2.5 mg 1 daily to help this she will follow-up in 3 months.  Patient was warned regarding gangrenous issues with the toes if she starts having this immediately to follow-up with her podiatrist Certainly she can follow-up with us if any ongoing troubles  25 minutes was spent with the patient.  This statement verifies that  25 minutes was indeed spent with the patient.  More than 50% of this visit-total duration of the visit-was spent in counseling and coordination of care. The issues that the patient came in for today as reflected in the diagnosis (s) please refer to documentation for further details.  The patient was cautioned regarding what to watch for regarding the possibility of necrosis and immediate care would be recommended should any of these signs occur

## 2018-04-06 NOTE — Telephone Encounter (Signed)
It would be fine for the patient to do shin Grix

## 2018-04-07 ENCOUNTER — Telehealth: Payer: Self-pay | Admitting: Family Medicine

## 2018-04-07 ENCOUNTER — Other Ambulatory Visit: Payer: Self-pay | Admitting: *Deleted

## 2018-04-07 LAB — RHEUMATOID FACTOR: Rheumatoid fact SerPl-aCnc: 11.3 IU/mL (ref 0.0–13.9)

## 2018-04-07 LAB — SEDIMENTATION RATE: Sed Rate: 4 mm/hr (ref 0–40)

## 2018-04-07 MED ORDER — ZOSTER VAC RECOMB ADJUVANTED 50 MCG/0.5ML IM SUSR: 0.5000 mL | Freq: Once | INTRAMUSCULAR | 0 refills | Status: AC

## 2018-04-07 NOTE — Telephone Encounter (Signed)
Pt contacted office and informed nurse that the Oxybutynin Chloride 10% get needs PA done. Pt informed that we have not received the fax from [facility] at this time. Informed patient that we would complete the PA as soon as we could. Pt verbalized understanding.

## 2018-04-07 NOTE — Telephone Encounter (Signed)
PA sent to plan today. Awaiting decision.  

## 2018-04-07 NOTE — Telephone Encounter (Signed)
Script sent to pharm. Left message to return call

## 2018-04-07 NOTE — Telephone Encounter (Signed)
Pt returned call. Pt states that she received her 2nd shingles shot yesterday when she went to pharmacy.

## 2018-04-08 DIAGNOSIS — T691XXD Chilblains, subsequent encounter: Secondary | ICD-10-CM | POA: Diagnosis not present

## 2018-04-08 DIAGNOSIS — T691XXA Chilblains, initial encounter: Secondary | ICD-10-CM | POA: Diagnosis not present

## 2018-04-08 NOTE — Telephone Encounter (Signed)
Gelnique gel approved by insurance. Approval good 04/08/2018-03/18/2019. Pharmacy notified.

## 2018-04-09 ENCOUNTER — Encounter: Payer: Self-pay | Admitting: Nurse Practitioner

## 2018-04-09 ENCOUNTER — Other Ambulatory Visit: Payer: Self-pay | Admitting: Nurse Practitioner

## 2018-04-15 DIAGNOSIS — L97511 Non-pressure chronic ulcer of other part of right foot limited to breakdown of skin: Secondary | ICD-10-CM | POA: Diagnosis not present

## 2018-04-15 DIAGNOSIS — T691XXD Chilblains, subsequent encounter: Secondary | ICD-10-CM | POA: Diagnosis not present

## 2018-04-15 DIAGNOSIS — L03031 Cellulitis of right toe: Secondary | ICD-10-CM | POA: Diagnosis not present

## 2018-04-16 ENCOUNTER — Telehealth: Payer: Self-pay | Admitting: Nurse Practitioner

## 2018-04-16 NOTE — Telephone Encounter (Signed)
Pt called and stated that Crystal has already spoken with her podiatrist and they agreed to add more percocet for her foot pain. She stated that her pharmacy will not let her fill the prescription that her podiatrist sent over because it is basically the same medication and amount as Crystal prescribes. So pt stated that the pharmacy told her that Crystal need to write a new prescription to cover both prescriptions.

## 2018-04-16 NOTE — Telephone Encounter (Signed)
Spoke with patient, she is asking Ms. Brooke DareKing to write a new prescription with the quantity to equal the script written here and the one written by Dr. Kaylyn Layerilles. Patient advised that no changes will be made outside of appt. Patient insists that Dr. Kaylyn Layerilles has spoken with Ms. Brooke DareKing about this and she agreed to do this. I told patient there is no documentation of this. Dr. Cherylann RatelLateef asked about this, stated there will be no increases in medication doses. Patient informed of this, became very angry, stating that I don't understand, and that the conversation occurred. Her tone was escalating and I told her the conversation is over and I hung up the phone.

## 2018-04-17 ENCOUNTER — Other Ambulatory Visit: Payer: Self-pay

## 2018-04-17 ENCOUNTER — Telehealth: Payer: Self-pay | Admitting: Student in an Organized Health Care Education/Training Program

## 2018-04-17 NOTE — Telephone Encounter (Addendum)
[place] from Dr. Kaylyn Layer office [phone] [facility], would like to speak with Nurse, she has been speaking w/ Dena. Please call

## 2018-04-17 NOTE — Telephone Encounter (Signed)
Left message for [facility] to call office.

## 2018-04-17 NOTE — Telephone Encounter (Signed)
Medication refill request: Clobetasol 0.05% Last AEX:  04/14/17 Next AEX: 06/23/18 Last MMG (if hormonal medication request):08/17/15 Birads 1 neg  Refill authorized: 30g with 0 rf. Please advise.

## 2018-04-17 NOTE — Telephone Encounter (Signed)
Dr. Judi Saa in Friday stating he would like to speak with Dr. Cherylann Ratel regarding patient.  [phone]

## 2018-04-20 ENCOUNTER — Other Ambulatory Visit: Payer: Self-pay | Admitting: Nurse Practitioner

## 2018-04-20 NOTE — Telephone Encounter (Signed)
HI Yes I spoke with Dr Elmer Picker on week ago.  He informed me that she was having increased pain in her foot at night.  He asked if it was reasonable to give her a short course to cover this additional pain secondary to her foot condition that he was treating.  I  informed him that since she was having a flare up of pain. This was ok.

## 2018-04-20 NOTE — Telephone Encounter (Signed)
This is a pharmacy issue. She has the Rx. If they choose not to fill it this is out of my control but I will call her.  Thanks. I left her a message making her aware of this. She can have pharmacy call if they have any questions.

## 2018-04-20 NOTE — Telephone Encounter (Signed)
Hey Crystal- do you know what this is about in regards to her Percocet? Did you all discuss this? If so, please fill me in

## 2018-04-20 NOTE — Telephone Encounter (Signed)
Forwarded to Dr Lateef 

## 2018-04-20 NOTE — Telephone Encounter (Signed)
Ok sounds good. Thanks for letting me know. Can you call the patient because I think she called last Thursday requesting Rx to be sent in. Please clarify with her. I told the nurses last Thursday "no" when she called but was not aware of this acute on chronic pain issue so please clarify this with there and continue with your plan as discussed with Dr Kaylyn Layer.   Thanks

## 2018-04-21 ENCOUNTER — Encounter: Payer: Self-pay | Admitting: Student in an Organized Health Care Education/Training Program

## 2018-04-22 MED ORDER — CLOBETASOL PROPIONATE 0.05 % EX OINT: 1.0000 "application " | TOPICAL_OINTMENT | CUTANEOUS | 0 refills | Status: DC | PRN

## 2018-04-28 ENCOUNTER — Encounter: Payer: Self-pay | Admitting: Nurse Practitioner

## 2018-04-28 ENCOUNTER — Other Ambulatory Visit: Payer: Self-pay

## 2018-04-28 ENCOUNTER — Ambulatory Visit: Payer: PPO | Attending: Nurse Practitioner | Admitting: Nurse Practitioner

## 2018-04-28 ENCOUNTER — Encounter: Payer: Self-pay | Admitting: Student in an Organized Health Care Education/Training Program

## 2018-04-28 ENCOUNTER — Telehealth: Payer: Self-pay

## 2018-04-28 VITALS — BP 142/92 | HR 68 | Temp 98.5°F | Resp 18 | Ht 68.0 in | Wt 245.0 lb

## 2018-04-28 DIAGNOSIS — Z79891 Long term (current) use of opiate analgesic: Secondary | ICD-10-CM | POA: Diagnosis not present

## 2018-04-28 DIAGNOSIS — G8929 Other chronic pain: Secondary | ICD-10-CM | POA: Diagnosis not present

## 2018-04-28 DIAGNOSIS — M5136 Other intervertebral disc degeneration, lumbar region: Secondary | ICD-10-CM | POA: Insufficient documentation

## 2018-04-28 DIAGNOSIS — M5440 Lumbago with sciatica, unspecified side: Secondary | ICD-10-CM | POA: Diagnosis not present

## 2018-04-28 DIAGNOSIS — M47816 Spondylosis without myelopathy or radiculopathy, lumbar region: Secondary | ICD-10-CM | POA: Diagnosis not present

## 2018-04-28 MED ORDER — OXYCODONE-ACETAMINOPHEN 10-325 MG PO TABS: 1.0000 | ORAL_TABLET | Freq: Four times a day (QID) | ORAL | 0 refills | Status: DC | PRN

## 2018-04-28 MED ORDER — METHOCARBAMOL 750 MG PO TABS: 750.0000 mg | ORAL_TABLET | Freq: Three times a day (TID) | ORAL | 0 refills | Status: DC | PRN

## 2018-04-28 MED ORDER — OXYCODONE-ACETAMINOPHEN 10-325 MG PO TABS: 1.0000 | ORAL_TABLET | Freq: Three times a day (TID) | ORAL | 0 refills | Status: DC | PRN

## 2018-04-28 NOTE — Progress Notes (Signed)
Nursing Pain Medication Assessment:  Safety precautions to be maintained throughout the outpatient stay will include: orient to surroundings, keep bed in low position, maintain call bell within reach at all times, provide assistance with transfer out of bed and ambulation.  Medication Inspection Compliance: Pill count conducted under aseptic conditions, in front of the patient. Neither the pills nor the bottle was removed from the patient's sight at any time. Once count was completed pills were immediately returned to the patient in their original bottle.  Medication: Oxycodone IR Pill/Patch Count: 5 of 90 pills remain Pill/Patch Appearance: Markings consistent with prescribed medication Bottle Appearance: Standard pharmacy container. Clearly labeled. Filled Date: 1 / 51 / 2020 Last Medication intake:  Today

## 2018-04-28 NOTE — Patient Instructions (Signed)
____________________________________________________________________________________________  Medication Rules  Purpose: To inform patients, and their family members, of our rules and regulations.  Applies to: All patients receiving prescriptions (written or electronic).  Pharmacy of record: Pharmacy where electronic prescriptions will be sent. If written prescriptions are taken to a different pharmacy, please inform the nursing staff. The pharmacy listed in the electronic medical record should be the one where you would like electronic prescriptions to be sent.  Electronic prescriptions: In compliance with the  Strengthen Opioid Misuse Prevention (STOP) Act of 2017 (Session Law 2017-74/H243), effective March 18, 2018, all controlled substances must be electronically prescribed. Calling prescriptions to the pharmacy will cease to exist.  Prescription refills: Only during scheduled appointments. Applies to all prescriptions.  NOTE: The following applies primarily to controlled substances (Opioid* Pain Medications).   Patient's responsibilities: 1. Pain Pills: Bring all pain pills to every appointment (except for procedure appointments). 2. Pill Bottles: Bring pills in original pharmacy bottle. Always bring the newest bottle. Bring bottle, even if empty. 3. Medication refills: You are responsible for knowing and keeping track of what medications you take and those you need refilled. The day before your appointment: write a list of all prescriptions that need to be refilled. The day of the appointment: give the list to the admitting nurse. Prescriptions will be written only during appointments. No prescriptions will be written on procedure days. If you forget a medication: it will not be "Called in", "Faxed", or "electronically sent". You will need to get another appointment to get these prescribed. No early refills. Do not call asking to have your prescription filled  early. 4. Prescription Accuracy: You are responsible for carefully inspecting your prescriptions before leaving our office. Have the discharge nurse carefully go over each prescription with you, before taking them home. Make sure that your name is accurately spelled, that your address is correct. Check the name and dose of your medication to make sure it is accurate. Check the number of pills, and the written instructions to make sure they are clear and accurate. Make sure that you are given enough medication to last until your next medication refill appointment. 5. Taking Medication: Take medication as prescribed. When it comes to controlled substances, taking less pills or less frequently than prescribed is permitted and encouraged. Never take more pills than instructed. Never take medication more frequently than prescribed.  6. Inform other Doctors: Always inform, all of your healthcare providers, of all the medications you take. 7. Pain Medication from other Providers: You are not allowed to accept any additional pain medication from any other Doctor or Healthcare provider. There are two exceptions to this rule. (see below) In the event that you require additional pain medication, you are responsible for notifying us, as stated below. 8. Medication Agreement: You are responsible for carefully reading and following our Medication Agreement. This must be signed before receiving any prescriptions from our practice. Safely store a copy of your signed Agreement. Violations to the Agreement will result in no further prescriptions. (Additional copies of our Medication Agreement are available upon request.) 9. Laws, Rules, & Regulations: All patients are expected to follow all Federal and State Laws, Statutes, Rules, & Regulations. Ignorance of the Laws does not constitute a valid excuse. The use of any illegal substances is prohibited. 10. Adopted CDC guidelines & recommendations: Target dosing levels will be  at or below 60 MME/day. Use of benzodiazepines** is not recommended.  Exceptions: There are only two exceptions to the rule of not   receiving pain medications from other Healthcare Providers. 1. Exception #1 (Emergencies): In the event of an emergency (i.e.: accident requiring emergency care), you are allowed to receive additional pain medication. However, you are responsible for: As soon as you are able, call our office [phone], at any time of the day or night, and leave a message stating your name, the date and nature of the emergency, and the name and dose of the medication prescribed. In the event that your call is answered by a member of our staff, make sure to document and save the date, time, and the name of the person that took your information.  2. Exception #2 (Planned Surgery): In the event that you are scheduled by another doctor or dentist to have any type of surgery or procedure, you are allowed (for a period no longer than 30 days), to receive additional pain medication, for the acute post-op pain. However, in this case, you are responsible for picking up a copy of our "Post-op Pain Management for Surgeons" handout, and giving it to your surgeon or dentist. This document is available at our office, and does not require an appointment to obtain it. Simply go to our office during business hours (Monday-Thursday from 8:00 AM to 4:00 PM) (Friday 8:00 AM to 12:00 Noon) or if you have a scheduled appointment with us, prior to your surgery, and ask for it by name. In addition, you will need to provide us with your name, name of your surgeon, type of surgery, and date of procedure or surgery.  *Opioid medications include: morphine, codeine, oxycodone, oxymorphone, hydrocodone, hydromorphone, meperidine, tramadol, tapentadol, buprenorphine, fentanyl, methadone. **Benzodiazepine medications include: diazepam (Valium), alprazolam (Xanax), clonazepam (Klonopine), lorazepam (Ativan), clorazepate  (Tranxene), chlordiazepoxide (Librium), estazolam (Prosom), oxazepam (Serax), temazepam (Restoril), triazolam (Halcion) (Last updated: 05/15/2017) ____________________________________________________________________________________________    

## 2018-04-28 NOTE — Telephone Encounter (Signed)
Increased by on tablet daily

## 2018-04-28 NOTE — Telephone Encounter (Signed)
Called an notified patient that she is getting 120 tabs instead of 90.
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
## 2018-04-28 NOTE — Progress Notes (Signed)
[facility] MD/PA/NP OP Progress Note  05/05/2018 3:29 PM Lauren Greene  MRN:  [id number]  Chief Complaint:  Chief Complaint    Follow-up; Depression     HPI:  Patient presents for follow-up appointment for depression.  She states that she has been doing good.  She is looking forward for 2 grandchildren to come in 5 months.  She is planning to take care of 1 of them when her younger daughter goes to work.  She shares her experience of losing baby in the past.  She states that she was recently prescribed on opioid for her Raynaud's phenomenon. She received negative interaction with the nurse in the pain clinic due to this situation, although she did not take this opioid from other provider.  She denies insomnia.  She has good motivation and energy.  She has fair concentration.  She denies SI.  She denies anxiety or panic attacks.   Visit Diagnosis:    ICD-10-CM   1. MDD (major depressive disorder), recurrent, in partial remission (HCC) F33.41     Past Psychiatric History: Please see initial evaluation for full details. I have reviewed the history. No updates at this time.     Past Medical History:  Past Medical History:  Diagnosis Date  . ADHD (attention deficit hyperactivity disorder)   . Arthritis   . Back pain   . Broken femur (HCC) 08/27/12  . Migraine   . Osteoarthritis   . Raynaud disease   . Raynaud phenomenon 04/06/2018   [facility]    Past Surgical History:  Procedure Laterality Date  . BACK SURGERY    . CERVICAL BIOPSY  W/ LOOP ELECTRODE EXCISION  1990's   [place], [place]  . ENDOMETRIAL BIOPSY  04-05-05 and 02-12-06   04-05-05 benign but too rapid growth. RXd Provera and repeat Bx 55mo. 02-12-06 Bx benign--Dr. Leda QuailSuzanne Miller  . FEMUR IM NAIL Left 08/27/2012   Procedure: INTRAMEDULLARY (IM) RETROGRADE FEMORAL NAILING;  Surgeon: Kathryne Hitchhristopher Y Blackman, MD;  Location: [facility] OR;  Service: Orthopedics;  Laterality: Left;  . HEEL SPUR SURGERY    . IM NAILING  FEMORAL SHAFT FRACTURE Left 08/27/2012   Dr Magnus IvanBLACKMAN  . SHOULDER SURGERY Left   . TOTAL KNEE ARTHROPLASTY     x2  . TUBAL LIGATION    . VAGINAL HYSTERECTOMY      Family Psychiatric History: Please see initial evaluation for full details. I have reviewed the history. No updates at this time.     Family History:  Family History  Problem Relation Age of Onset  . Cancer Mother   . Breast cancer Mother   . Bipolar disorder Mother   . Heart failure Father   . Cancer Father   . Cancer Other   . Heart failure Other   . Bipolar disorder Sister     Social History:  Social History   Socioeconomic History  . Marital status: Single    Spouse name: Not on file  . Number of children: Not on file  . Years of education: Not on file  . Highest education level: Not on file  Occupational History  . Not on file  Social Needs  . Financial resource strain: Not on file  . Food insecurity:    Worry: Not on file    Inability: Not on file  . Transportation needs:    Medical: Not on file    Non-medical: Not on file  Tobacco Use  . Smoking status: Former Smoker  Years: 10.00    Types: Cigarettes    Last attempt to quit: 08/16/2000    Years since quitting: 17.7  . Smokeless tobacco: Never Used  Substance and Sexual Activity  . Alcohol use: Yes    Alcohol/week: 0.0 standard drinks    Comment: rarely  . Drug use: No  . Sexual activity: Not Currently    Partners: Female  Lifestyle  . Physical activity:    Days per week: Not on file    Minutes per session: Not on file  . Stress: Not on file  Relationships  . Social connections:    Talks on phone: Not on file    Gets together: Not on file    Attends religious service: Not on file    Active member of club or organization: Not on file    Attends meetings of clubs or organizations: Not on file    Relationship status: Not on file  Other Topics Concern  . Not on file  Social History Narrative  . Not on file    Allergies:   Allergies  Allergen Reactions  . Wellbutrin [Bupropion]     Anger issues  . Lyrica [Pregabalin] Swelling    Leg swelling    Metabolic Disorder Labs: Lab Results  Component Value Date   HGBA1C 5.6 07/22/2014   MPG 103 02/16/2013   MPG 111 07/24/2010   No results found for: PROLACTIN Lab Results  Component Value Date   CHOL 185 01/09/2018   TRIG 102 01/09/2018   HDL 50 01/09/2018   CHOLHDL 3.7 01/09/2018   VLDL 13 07/10/2012   LDLCALC 115 (H) 01/09/2018   LDLCALC 116 (H) 03/15/2017   Lab Results  Component Value Date   TSH 4.104 06/24/2013   TSH 4.142 01/07/2013    Therapeutic Level Labs: No results found for: LITHIUM No results found for: VALPROATE No components found for:  CBMZ  Current Medications: Current Outpatient Medications  Medication Sig Dispense Refill  . amLODipine (NORVASC) 2.5 MG tablet Take by mouth.    Marland Kitchen. amphetamine-dextroamphetamine (ADDERALL XR) 30 MG 24 hr capsule 2 q am 60 capsule 0  . amphetamine-dextroamphetamine (ADDERALL XR) 30 MG 24 hr capsule 2 qam 60 capsule 0  . amphetamine-dextroamphetamine (ADDERALL XR) 30 MG 24 hr capsule 2 qam 60 capsule 0  . clobetasol ointment (TEMOVATE) 0.05 % Apply 1 application topically as needed. 30 g 0  . diazepam (VALIUM) 2 MG tablet Take 1 tablet (2 mg total) by mouth daily as needed for anxiety. 30 tablet 0  . Diclofenac Sodium CR 100 MG 24 hr tablet TAKE 1 TABLET BY MOUTH TWICE DAILY. TAKE WITH FOOD. 60 tablet 4  . DULoxetine (CYMBALTA) 60 MG capsule Take 2 capsules (120 mg total) by mouth daily. 180 capsule 1  . lidocaine (XYLOCAINE) 2 % jelly Apply 1 application topically 2 (two) times daily. 4 mL 1  . lidocaine (XYLOCAINE) 2 % solution TAKE 5 TO 10 MLS BY MOUTH TWICE DAILY AS NEEDED. 100 mL 0  . methocarbamol (ROBAXIN) 750 MG tablet Take 1 tablet (750 mg total) by mouth every 8 (eight) hours as needed for up to 30 days for muscle spasms. 90 tablet 0  . nystatin (MYCOSTATIN/NYSTOP) powder APPLY TO  AFFECTED AREA(S) TOPICALLY TWICE DAILY. 45 g 6  . omeprazole (PRILOSEC) 40 MG capsule TAKE (1) CAPSULE BY MOUTH ONCE DAILY. 30 capsule 12  . Oxybutynin Chloride 10 % GEL Apply daily to skin for overactive bladder 1 g 6  . oxyCODONE-acetaminophen (PERCOCET)  10-325 MG tablet Take 1 tablet by mouth every 6 (six) hours as needed for up to 30 days for pain. 120 tablet 0  . sulfacetamide (BLEPH-10) 10 % ophthalmic solution 2 drops to the affected eye qid for 3-5 days 15 mL 0  . sulfamethoxazole-trimethoprim (BACTRIM DS,SEPTRA DS) 800-160 MG tablet Take 1 tablet by mouth 2 (two) times daily.    . traZODone (DESYREL) 100 MG tablet 100-200 mg at night as needed for sleep 180 tablet 1   No current facility-administered medications for this visit.      Musculoskeletal: Strength & Muscle Tone: within normal limits Gait & Station: normal Patient leans: N/A  Psychiatric Specialty Exam: Review of Systems  Psychiatric/Behavioral: Negative for depression, hallucinations, memory loss, substance abuse and suicidal ideas. The patient is not nervous/anxious and does not have insomnia.   All other systems reviewed and are negative.   Blood pressure 139/83, pulse 70, height 5\' 8"  (1.727 m), weight 250 lb 9.6 oz (113.7 kg), last menstrual period 03/18/2004, SpO2 94 %.Body mass index is 38.1 kg/m.  General Appearance: Fairly Groomed  Eye Contact:  Good  Speech:  Clear and Coherent  Volume:  Normal  Mood:  "good"  Affect:  Appropriate, Congruent and euthymic  Thought Process:  Coherent  Orientation:  Full (Time, Place, and Person)  Thought Content: Logical   Suicidal Thoughts:  No  Homicidal Thoughts:  No  Memory:  Immediate;   Good  Judgement:  Good  Insight:  Good  Psychomotor Activity:  Normal  Concentration:  Concentration: Good and Attention Span: Good  Recall:  Good  Fund of Knowledge: Good  Language: Good  Akathisia:  No  Handed:  Right  AIMS (if indicated): not done  Assets:   Communication Skills Desire for Improvement  ADL's:  Intact  Cognition: WNL  Sleep:  Good   Screenings: PHQ2-9     Office Visit from 04/28/2018 in [facility] Clinical Support from 02/26/2018 in [facility] Procedure visit from 12/01/2017 in [facility] Clinical Support from 09/03/2017 in [facility]  PHQ-2 Total Score  0  0  0  0  0       Assessment and Plan:  DOROTHA HIRSCHI is a 53 y.o. year old female with a history of depression, ADHD by history, anxiety,  hyperhydrosis, chronic pain, who presents for follow up appointment for MDD (major depressive disorder), recurrent, in partial remission (HCC)  # MDD in partial remission Patient denies significant mood symptoms since the last visit.  Will continue duloxetine to target depression and pain.  Discussed risk of hypertension.  Will continue trazodone as needed for insomnia.  Will continue Valium as needed for anxiety.  Discussed risk of dependence and oversedation.  Discussed behavioral activation.   # ADHD She was diagnosed with ADHD by PCP; she reports good benefit from Adderall.  She is informed that Adderall can cause Raynaud's phenomenon, although it is not a common side effect.  She is advised to discuss with her PCP for further evaluation/adjustment of her medication.   Plan I have reviewed and updated plans as below 1 Continue duloxetine 120 mg daily  2. Continue Trazodone 100 - 200 mg at night as needed for sleep 3.Continue valium 2 mg daily as needed for anxiety (she declines refill at this time) 4.Return  to clinic in four monthsfor 15 mins (Patient is on Adderall XR 60 mg daily prescribed by her PCP)  The patient demonstrates the following risk factors for  suicide: Chronic risk factors for suicide include: psychiatric disorder of depressionand chronic pain. Acute risk factorsfor suicide include: loss (financial, interpersonal, professional). Protective factorsfor this patient include: responsibility to others (children, family), coping skills and hope for the future. Considering these factors, the overall suicide risk at this point appears to be low. Patient isappropriate for outpatient follow up.  Neysa Hotter, MD 05/05/2018, 3:29 PM

## 2018-05-05 ENCOUNTER — Ambulatory Visit (INDEPENDENT_AMBULATORY_CARE_PROVIDER_SITE_OTHER): Payer: PPO | Admitting: Psychiatry

## 2018-05-05 VITALS — BP 139/83 | HR 70 | Ht 68.0 in | Wt 250.6 lb

## 2018-05-05 DIAGNOSIS — F3341 Major depressive disorder, recurrent, in partial remission: Secondary | ICD-10-CM | POA: Diagnosis not present

## 2018-05-05 MED ORDER — TRAZODONE HCL 100 MG PO TABS: ORAL_TABLET | ORAL | 1 refills | Status: DC

## 2018-05-05 MED ORDER — DULOXETINE HCL 60 MG PO CPEP: 120.0000 mg | ORAL_CAPSULE | Freq: Every day | ORAL | 1 refills | Status: DC

## 2018-05-05 NOTE — Patient Instructions (Addendum)
1 Continue duloxetine 120 mg daily  2. Continue Trazodone 100 - 200 mg at night as needed for sleep 3.Continue valium 2 mg daily as needed for anxiety  4.Return to clinic in four monthsfor 15 mins

## 2018-05-28 ENCOUNTER — Encounter: Payer: Self-pay | Admitting: Nurse Practitioner

## 2018-06-02 ENCOUNTER — Encounter: Payer: Self-pay | Admitting: Nurse Practitioner

## 2018-06-04 ENCOUNTER — Other Ambulatory Visit: Payer: Self-pay

## 2018-06-04 ENCOUNTER — Encounter: Payer: Self-pay | Admitting: Nurse Practitioner

## 2018-06-04 ENCOUNTER — Ambulatory Visit: Payer: PPO | Attending: Nurse Practitioner | Admitting: Nurse Practitioner

## 2018-06-04 VITALS — BP 133/81 | HR 61 | Temp 98.1°F | Ht 68.0 in | Wt 249.0 lb

## 2018-06-04 DIAGNOSIS — M79671 Pain in right foot: Secondary | ICD-10-CM | POA: Diagnosis not present

## 2018-06-04 DIAGNOSIS — Z79891 Long term (current) use of opiate analgesic: Secondary | ICD-10-CM | POA: Insufficient documentation

## 2018-06-04 DIAGNOSIS — M25551 Pain in right hip: Secondary | ICD-10-CM | POA: Diagnosis not present

## 2018-06-04 DIAGNOSIS — M25561 Pain in right knee: Secondary | ICD-10-CM | POA: Diagnosis not present

## 2018-06-04 DIAGNOSIS — G8929 Other chronic pain: Secondary | ICD-10-CM | POA: Insufficient documentation

## 2018-06-04 DIAGNOSIS — G894 Chronic pain syndrome: Secondary | ICD-10-CM

## 2018-06-04 DIAGNOSIS — M5440 Lumbago with sciatica, unspecified side: Secondary | ICD-10-CM | POA: Insufficient documentation

## 2018-06-04 DIAGNOSIS — M79672 Pain in left foot: Secondary | ICD-10-CM | POA: Insufficient documentation

## 2018-06-04 DIAGNOSIS — M25552 Pain in left hip: Secondary | ICD-10-CM | POA: Insufficient documentation

## 2018-06-04 DIAGNOSIS — M25562 Pain in left knee: Secondary | ICD-10-CM | POA: Diagnosis not present

## 2018-06-04 MED ORDER — OXYCODONE-ACETAMINOPHEN 10-325 MG PO TABS: 1.0000 | ORAL_TABLET | Freq: Four times a day (QID) | ORAL | 0 refills | Status: DC | PRN

## 2018-06-04 MED ORDER — METHOCARBAMOL 750 MG PO TABS: 750.0000 mg | ORAL_TABLET | Freq: Three times a day (TID) | ORAL | 0 refills | Status: AC | PRN
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
## 2018-06-04 NOTE — Progress Notes (Signed)
Nursing Pain Medication Assessment:  Safety precautions to be maintained throughout the outpatient stay will include: orient to surroundings, keep bed in low position, maintain call bell within reach at all times, provide assistance with transfer out of bed and ambulation.  Medication Inspection Compliance: Pill count conducted under aseptic conditions, in front of the patient. Neither the pills nor the bottle was removed from the patient's sight at any time. Once count was completed pills were immediately returned to the patient in their original bottle.  Medication: Oxycodone/APAP Pill/Patch Count: 6 of 120 pills remain Pill/Patch Appearance: Markings consistent with prescribed medication Bottle Appearance: Standard pharmacy container. Clearly labeled. Filled Date: 2 / 63 / 2020 Last Medication intake:  Today

## 2018-06-04 NOTE — Patient Instructions (Signed)
____________________________________________________________________________________________  Medication Rules  Purpose: To inform patients, and their family members, of our rules and regulations.  Applies to: All patients receiving prescriptions (written or electronic).  Pharmacy of record: Pharmacy where electronic prescriptions will be sent. If written prescriptions are taken to a different pharmacy, please inform the nursing staff. The pharmacy listed in the electronic medical record should be the one where you would like electronic prescriptions to be sent.  Electronic prescriptions: In compliance with the [place] Strengthen Opioid Misuse Prevention (STOP) Act of 2017 (Session Law 2017-74/H243), effective March 18, 2018, all controlled substances must be electronically prescribed. Calling prescriptions to the pharmacy will cease to exist.  Prescription refills: Only during scheduled appointments. Applies to all prescriptions.  NOTE: The following applies primarily to controlled substances (Opioid* Pain Medications).   Patient's responsibilities: 1. Pain Pills: Bring all pain pills to every appointment (except for procedure appointments). 2. Pill Bottles: Bring pills in original pharmacy bottle. Always bring the newest bottle. Bring bottle, even if empty. 3. Medication refills: You are responsible for knowing and keeping track of what medications you take and those you need refilled. The day before your appointment: write a list of all prescriptions that need to be refilled. The day of the appointment: give the list to the admitting nurse. Prescriptions will be written only during appointments. No prescriptions will be written on procedure days. If you forget a medication: it will not be "Called in", "Faxed", or "electronically sent". You will need to get another appointment to get these prescribed. No early refills. Do not call asking to have your prescription filled  early. 4. Prescription Accuracy: You are responsible for carefully inspecting your prescriptions before leaving our office. Have the discharge nurse carefully go over each prescription with you, before taking them home. Make sure that your name is accurately spelled, that your address is correct. Check the name and dose of your medication to make sure it is accurate. Check the number of pills, and the written instructions to make sure they are clear and accurate. Make sure that you are given enough medication to last until your next medication refill appointment. 5. Taking Medication: Take medication as prescribed. When it comes to controlled substances, taking less pills or less frequently than prescribed is permitted and encouraged. Never take more pills than instructed. Never take medication more frequently than prescribed.  6. Inform other Doctors: Always inform, all of your healthcare providers, of all the medications you take. 7. Pain Medication from other Providers: You are not allowed to accept any additional pain medication from any other Doctor or Healthcare provider. There are two exceptions to this rule. (see below) In the event that you require additional pain medication, you are responsible for notifying us, as stated below. 8. Medication Agreement: You are responsible for carefully reading and following our Medication Agreement. This must be signed before receiving any prescriptions from our practice. Safely store a copy of your signed Agreement. Violations to the Agreement will result in no further prescriptions. (Additional copies of our Medication Agreement are available upon request.) 9. Laws, Rules, & Regulations: All patients are expected to follow all Federal and State Laws, Statutes, Rules, & Regulations. Ignorance of the Laws does not constitute a valid excuse. The use of any illegal substances is prohibited. 10. Adopted CDC guidelines & recommendations: Target dosing levels will be  at or below 60 MME/day. Use of benzodiazepines** is not recommended.  Exceptions: There are only two exceptions to the rule of not   receiving pain medications from other Healthcare Providers. 1. Exception #1 (Emergencies): In the event of an emergency (i.e.: accident requiring emergency care), you are allowed to receive additional pain medication. However, you are responsible for: As soon as you are able, call our office [phone], at any time of the day or night, and leave a message stating your name, the date and nature of the emergency, and the name and dose of the medication prescribed. In the event that your call is answered by a member of our staff, make sure to document and save the date, time, and the name of the person that took your information.  2. Exception #2 (Planned Surgery): In the event that you are scheduled by another doctor or dentist to have any type of surgery or procedure, you are allowed (for a period no longer than 30 days), to receive additional pain medication, for the acute post-op pain. However, in this case, you are responsible for picking up a copy of our "Post-op Pain Management for Surgeons" handout, and giving it to your surgeon or dentist. This document is available at our office, and does not require an appointment to obtain it. Simply go to our office during business hours (Monday-Thursday from 8:00 AM to 4:00 PM) (Friday 8:00 AM to 12:00 Noon) or if you have a scheduled appointment with us, prior to your surgery, and ask for it by name. In addition, you will need to provide us with your name, name of your surgeon, type of surgery, and date of procedure or surgery.  *Opioid medications include: morphine, codeine, oxycodone, oxymorphone, hydrocodone, hydromorphone, meperidine, tramadol, tapentadol, buprenorphine, fentanyl, methadone. **Benzodiazepine medications include: diazepam (Valium), alprazolam (Xanax), clonazepam (Klonopine), lorazepam (Ativan), clorazepate  (Tranxene), chlordiazepoxide (Librium), estazolam (Prosom), oxazepam (Serax), temazepam (Restoril), triazolam (Halcion) (Last updated: 05/15/2017) ____________________________________________________________________________________________    

## 2018-06-09 LAB — TOXASSURE SELECT 13 (MW), URINE

## 2018-06-18 ENCOUNTER — Ambulatory Visit (INDEPENDENT_AMBULATORY_CARE_PROVIDER_SITE_OTHER): Payer: PPO | Admitting: Family Medicine

## 2018-06-18 ENCOUNTER — Other Ambulatory Visit: Payer: Self-pay

## 2018-06-18 DIAGNOSIS — F988 Other specified behavioral and emotional disorders with onset usually occurring in childhood and adolescence: Secondary | ICD-10-CM

## 2018-06-18 DIAGNOSIS — I7300 Raynaud's syndrome without gangrene: Secondary | ICD-10-CM

## 2018-06-18 MED ORDER — AMPHETAMINE-DEXTROAMPHET ER 30 MG PO CP24: ORAL_CAPSULE | ORAL | 0 refills | Status: DC

## 2018-06-18 MED ORDER — AMLODIPINE BESYLATE 2.5 MG PO TABS: 2.5000 mg | ORAL_TABLET | Freq: Every day | ORAL | 5 refills | Status: DC

## 2018-06-18 MED ORDER — NYSTATIN 100000 UNIT/GM EX POWD: CUTANEOUS | 6 refills | Status: DC

## 2018-06-18 NOTE — Progress Notes (Signed)
   Subjective:    Patient ID: Lauren Greene, female    DOB: [birth year], 53 y.o.   MRN: [id number]  HPIADD check up. Takes adderall xr 30mg  2qam. Pt states she could use a higher dose but ok with staying on the same dose. Patient states she has been doing well with her medication she takes her ADD medicine on a regular basis states it does help her focus she understands that we will not be increasing the dose  She does take amlodipine 2.5 mg because of a similar condition to Raynauds of her toes that was being followed by wound care specialist but they released her she needs refills  Needs refill on nystatin powder and amlodipine which was prescribed by specialist she is not seeing anymore)   Pt states no concerns today.   Virtual Visit via Telephone Note Video phone was accomplished I connected with Melburn Hake on 06/18/18 at 11:00 AM EDT by telephone and verified that I am speaking with the correct person using two identifiers.   I discussed the limitations, risks, security and privacy concerns of performing an evaluation and management service by telephone and the availability of in person appointments. I also discussed with the patient that there may be a patient responsible charge related to this service. The patient expressed understanding and agreed to proceed.   History of Present Illness:    Observations/Objective:   Assessment and Plan:   Follow Up Instructions:    I discussed the assessment and treatment plan with the patient. The patient was provided an opportunity to ask questions and all were answered. The patient agreed with the plan and demonstrated an understanding of the instructions.   The patient was advised to call back or seek an in-person evaluation if the symptoms worsen or if the condition fails to improve as anticipated.  I provided 15 minutes of non-face-to-face time during this encounter.   Kyra Manges, LPN Patient located at home  Patient was spoken to via video phone Provider located at the office    Review of Systems     Objective:   Physical Exam  Unable to do physical exam via phone      Assessment & Plan:  Hold off on any screening labs ADD continue current medication doing well with this drug registry checked 3 prescription sent in Follow-up if progressive troubles or worse Warning signs discussed in detail

## 2018-06-23 ENCOUNTER — Ambulatory Visit: Payer: PPO | Admitting: Obstetrics & Gynecology

## 2018-06-25 ENCOUNTER — Other Ambulatory Visit: Payer: Self-pay

## 2018-06-25 ENCOUNTER — Ambulatory Visit: Payer: PPO | Attending: Nurse Practitioner | Admitting: Nurse Practitioner

## 2018-06-25 DIAGNOSIS — M797 Fibromyalgia: Secondary | ICD-10-CM | POA: Diagnosis not present

## 2018-06-25 DIAGNOSIS — G8929 Other chronic pain: Secondary | ICD-10-CM | POA: Diagnosis not present

## 2018-06-25 DIAGNOSIS — M79671 Pain in right foot: Secondary | ICD-10-CM

## 2018-06-25 DIAGNOSIS — G894 Chronic pain syndrome: Secondary | ICD-10-CM

## 2018-06-25 DIAGNOSIS — M25552 Pain in left hip: Secondary | ICD-10-CM | POA: Diagnosis not present

## 2018-06-25 DIAGNOSIS — M25562 Pain in left knee: Secondary | ICD-10-CM | POA: Diagnosis not present

## 2018-06-25 DIAGNOSIS — M47816 Spondylosis without myelopathy or radiculopathy, lumbar region: Secondary | ICD-10-CM | POA: Diagnosis not present

## 2018-06-25 DIAGNOSIS — M25561 Pain in right knee: Secondary | ICD-10-CM | POA: Diagnosis not present

## 2018-06-25 DIAGNOSIS — M79672 Pain in left foot: Secondary | ICD-10-CM

## 2018-06-25 DIAGNOSIS — M25551 Pain in right hip: Secondary | ICD-10-CM

## 2018-06-25 MED ORDER — OXYCODONE-ACETAMINOPHEN 10-325 MG PO TABS: 1.0000 | ORAL_TABLET | Freq: Four times a day (QID) | ORAL | 0 refills | Status: DC | PRN

## 2018-06-25 NOTE — Progress Notes (Signed)
Pain Management Encounter Note - Virtual Visit via Telephone Telehealth (real-time audio visits between healthcare provider and patient).  Patient's Phone No. & Preferred Pharmacy:  [phone] (home); [phone] (mobile); (Preferred) [phone]  [facility] - [place], [place] - [address] [place] [address] Phone: [phone] Fax: [phone]   Pre-screening note:  Our staff contacted Lauren Greene and offered her an "in person", "face-to-face" appointment versus a telephone encounter. She indicated preferring the telephone encounter, at this time.  Reason for Virtual Visit: COVID-19*  Social distancing based on CDC and AMA recommendations.   I contacted Lauren Greene on 06/25/2018 at 10:14 AM by telephone and clearly identified myself as Thad Ranger, NP. I verified that I was speaking with the correct person using two identifiers (Name and date of birth: [birth year]).  Advanced Informed Consent I sought verbal advanced consent from Lauren Greene for telemedicine interactions and virtual visit. I informed Lauren Greene of the security and privacy concerns, risks, and limitations associated with performing an evaluation and management service by telephone. I also informed Lauren Greene of the availability of "in person" appointments and I informed her of the possibility of a patient responsible charge related to this service. Lauren Greene expressed understanding and agreed to proceed.   Historic Elements   Lauren Greene is a 53 y.o. year old, female patient evaluated today after her last encounter by our practice on 06/04/2018. Lauren Greene  has a past medical history of ADHD (attention deficit hyperactivity disorder), Arthritis, Back pain, Broken femur (HCC) (08/27/12), Migraine, Osteoarthritis, Raynaud disease, and Raynaud phenomenon (04/06/2018). She also  has a past surgical history that includes Total knee arthroplasty; Heel spur surgery; Back  surgery; IM nailing femoral shaft fracture (Left, 08/27/2012); Femur IM nail (Left, 08/27/2012); Cervical biopsy w/ loop electrode excision (1990's); Endometrial biopsy (04-05-05 and 02-12-06); Tubal ligation; Shoulder surgery (Left); and Vaginal hysterectomy. Lauren Greene has a current medication list which includes the following prescription(s): amlodipine, amphetamine-dextroamphetamine, amphetamine-dextroamphetamine, amphetamine-dextroamphetamine, clobetasol ointment, diazepam, diclofenac sodium cr, duloxetine, lidocaine, lidocaine, methocarbamol, nystatin, omeprazole, oxybutynin chloride, oxycodone-acetaminophen, sulfacetamide, and trazodone. She  reports that she quit smoking about 17 years ago. Her smoking use included cigarettes. She quit after 10.00 years of use. She has never used smokeless tobacco. She reports current alcohol use. She reports that she does not use drugs. Lauren Greene is allergic to wellbutrin [bupropion] and lyrica [pregabalin].   HPI  I last saw her on 06/04/2018. She is being evaluated for medication management. She is having 7/10. She is having lower back pain. She also has generalized pain. She feels like the pain is just in the back. She feels like the right side is greater than the left. She has weakness in her back. She has problems lifting her right leg due to the back pain. She feels like the pain is worse. She feels like this is related to age. She admits that mornings are the worse. She is not interested in any injections because they only last about one day.  Pharmacotherapy Assessment  Analgesic: Percocet 10/325mg  QID MME/day: 60 mg/day.   Monitoring: Pharmacotherapy: No side-effects or adverse reactions reported. [place] PMP: PDMP not reviewed this encounter.       Compliance: No problems identified. Plan: Refer to "POC".  Review of recent tests  DG Wrist Complete Left CLINICAL DATA:  53 year old female. No injury. Increasing left wrist pain around distal ulna.  Initial encounter.  EXAM: LEFT WRIST - COMPLETE 3+ VIEW  COMPARISON:  01/30/2009 left thumb MR.  FINDINGS: Mild degenerative changes distal radial lunate articulation. Slight increase size of interspace between the lunate and scaphoid may indicate ligamentous laxity and/or result of remote ligamentous injury. Minimal spur triquetrum. Moderate left first metacarpal phalangeal joint degenerative changes. No discrete osseous abnormality of the distal ulnar. No fracture or dislocation.  IMPRESSION: 1. Mild degenerative changes distal radial lunate articulation. 2. Slight increase size of interspace between the lunate and scaphoid may indicate ligamentous laxity and/or result of remote ligamentous injury. 3. Minimal spur triquetrum. 4. Moderate left first metacarpal phalangeal joint degenerative changes. 5. No discrete osseous abnormality of the distal ulnar.  Electronically Signed   By: Lacy DuverneySteven  Olson M.D.   On: 01/12/2018 08:37   Office Visit on 06/04/2018  Component Date Value Ref Range Status  . Summary 06/04/2018 FINAL   Final   Comment: ==================================================================== TOXASSURE SELECT 13 (MW) ==================================================================== Test                             Result       Flag       Units Drug Present and Declared for Prescription Verification   Amphetamine                    >3106        EXPECTED   ng/mg creat    Amphetamine is available as a schedule II prescription drug.   Oxycodone                      >3106        EXPECTED   ng/mg creat   Oxymorphone                    2325         EXPECTED   ng/mg creat   Noroxycodone                   >3106        EXPECTED   ng/mg creat   Noroxymorphone                 378          EXPECTED   ng/mg creat    Sources of oxycodone are scheduled prescription medications.    Oxymorphone, noroxycodone, and noroxymorphone are expected    metabolites of oxycodone.  Oxymorphone is also available as a    scheduled prescription medication. Drug Absent but Declared for Prescription Verification   Diazepam                                                 Not Detected UNEXPECTED ng/mg creat ==================================================================== Test                      Result    Flag   Units      Ref Range   Creatinine              322              mg/dL      >=57>=20 ==================================================================== Declared Medications:  The flagging and interpretation on this report are based on the  following declared medications.  Unexpected results may arise from  inaccuracies in the declared medications.  **  Note: The testing scope of this panel includes these medications:  Amphetamine (Amphetamine-Dextroamphetamine)  Diazepam  Oxycodone (Percocet)  **Note: The testing scope of this panel does not include following  reported medications:  Acetaminophen (Percocet)  Amlodipine  Clobetasol  Diclofenac  Duloxetine  Eye Drop (Sulfacetamide)  Lidocaine  Methocarbamol  Nystatin  Omeprazole  Oxybutynin  Sulfamethoxazole (Bactrim)  Trazodone (Desyrel)  Trimeth                          oprim (Bactrim) ==================================================================== For clinical consultation, please call [phone]. ====================================================================    Assessment  The primary encounter diagnosis was Lumbar spondylosis. Diagnoses of Fibromyalgia, Foot pain, bilateral, Chronic arthralgias of knees and hips, and Chronic pain syndrome were also pertinent to this visit.  Plan of Care  I am having Kelsye Loomer. Hornbaker maintain her omeprazole, lidocaine, lidocaine, diazepam, sulfacetamide, Diclofenac Sodium CR, Oxybutynin Chloride, clobetasol ointment, DULoxetine, traZODone, methocarbamol, nystatin, amLODipine, amphetamine-dextroamphetamine, amphetamine-dextroamphetamine,  amphetamine-dextroamphetamine, and oxyCODONE-acetaminophen.  Pharmacotherapy (Medications Ordered): Meds ordered this encounter  Medications  . oxyCODONE-acetaminophen (PERCOCET) 10-325 MG tablet    Sig: Take 1 tablet by mouth every 6 (six) hours as needed for up to 30 days for pain.    Dispense:  120 tablet    Refill:  0    Do not add this medication to the electronic "Automatic Refill" notification system. Patient may have prescription filled one day early if pharmacy is closed on scheduled refill date.    Order Specific Question:   Supervising Provider    Answer:   Edward Jolly [[id number]]   Orders:  No orders of the defined types were placed in this encounter.  Follow-up plan:   Return in about 4 weeks (around 07/23/2018) for MedMgmt. Declined Robaxin refill   I discussed the assessment and treatment plan with the patient. The patient was provided an opportunity to ask questions and all were answered. The patient agreed with the plan and demonstrated an understanding of the instructions.  Patient advised to call back or seek an in-person evaluation if the symptoms or condition worsens.  Total duration of non-face-to-face encounter: 12 minutes.  Note by: Thad Ranger, NP Date: 06/25/2018; Time: 10:14 AM  Disclaimer:  * Given the special circumstances of the COVID-19 pandemic, the federal government has announced that the Office for Civil Rights (OCR) will exercise its enforcement discretion and will not impose penalties on physicians using telehealth in the event of noncompliance with regulatory requirements under the [employer] Portability and Accountability Act (HIPAA) in connection with the good faith provision of telehealth during the COVID-19 national public health emergency. (AMA)

## 2018-07-08 ENCOUNTER — Ambulatory Visit: Payer: PPO | Admitting: Family Medicine

## 2018-07-15 ENCOUNTER — Other Ambulatory Visit: Payer: Self-pay

## 2018-07-15 ENCOUNTER — Ambulatory Visit (INDEPENDENT_AMBULATORY_CARE_PROVIDER_SITE_OTHER): Payer: PPO | Admitting: Family Medicine

## 2018-07-15 DIAGNOSIS — B309 Viral conjunctivitis, unspecified: Secondary | ICD-10-CM | POA: Diagnosis not present

## 2018-07-15 NOTE — Progress Notes (Signed)
   Subjective:    Patient ID: Lauren Greene, female    DOB: September 02, 1965, 53 y.o.   MRN: [id number]  HPI Format- video  Patient present at home Provider present at office Consent for interaction obtained Coronavirus outbreak made virtual visit necessary   Patient woke up with her left eye very red and irritated yesterday. No discharge just very red-tried some sulfacetamide drops but they didn't help and also tried naphcon eye drops.  Virtual Visit via Video Note  I connected with Lauren Greene on 07/15/18 at 11:00 AM EDT by a video enabled telemedicine application and verified that I am speaking with the correct person using two identifiers.   I discussed the limitations of evaluation and management by telemedicine and the availability of in person appointments. The patient expressed understanding and agreed to proceed.  History of Present Illness: Left eye redness irritation watering denies any yellow-green or crusting.  Denies fever chills sweats wheezing difficulty breathing   Observations/Objective:   Assessment and Plan:   Follow Up Instructions:    I discussed the assessment and treatment plan with the patient. The patient was provided an opportunity to ask questions and all were answered. The patient agreed with the plan and demonstrated an understanding of the instructions.   The patient was advised to call back or seek an in-person evaluation if the symptoms worsen or if the condition fails to improve as anticipated.  I provided 10 minutes of non-face-to-face time during this encounter.      Review of Systems     Objective:   Physical Exam  Based upon the global redness and watering along with irritation I believe this is more likely conjunctivitis patient will let us know in the next 3 to 5 days if it is not going away      Assessment & Plan:  Viral conjunctivitis OTC measures recommended avoiding antibiotic eyedrops for now warning signs were  discussed if severe yellow-green drainage or worsening redness or swelling to call us may need antibiotic eyedrops at that point

## 2018-07-22 ENCOUNTER — Ambulatory Visit: Payer: PPO | Attending: Nurse Practitioner | Admitting: Nurse Practitioner

## 2018-07-22 ENCOUNTER — Other Ambulatory Visit: Payer: Self-pay

## 2018-07-23 ENCOUNTER — Encounter: Payer: Self-pay | Admitting: Student in an Organized Health Care Education/Training Program

## 2018-07-27 ENCOUNTER — Other Ambulatory Visit: Payer: Self-pay

## 2018-07-27 ENCOUNTER — Encounter: Payer: Self-pay | Admitting: Student in an Organized Health Care Education/Training Program

## 2018-07-27 ENCOUNTER — Encounter: Payer: Self-pay | Admitting: Nurse Practitioner

## 2018-07-27 ENCOUNTER — Ambulatory Visit
Payer: PPO | Attending: Student in an Organized Health Care Education/Training Program | Admitting: Student in an Organized Health Care Education/Training Program

## 2018-07-27 DIAGNOSIS — M25552 Pain in left hip: Secondary | ICD-10-CM

## 2018-07-27 DIAGNOSIS — M25561 Pain in right knee: Secondary | ICD-10-CM | POA: Diagnosis not present

## 2018-07-27 DIAGNOSIS — M47816 Spondylosis without myelopathy or radiculopathy, lumbar region: Secondary | ICD-10-CM

## 2018-07-27 DIAGNOSIS — M25551 Pain in right hip: Secondary | ICD-10-CM | POA: Diagnosis not present

## 2018-07-27 DIAGNOSIS — G8929 Other chronic pain: Secondary | ICD-10-CM

## 2018-07-27 DIAGNOSIS — Z79891 Long term (current) use of opiate analgesic: Secondary | ICD-10-CM | POA: Diagnosis not present

## 2018-07-27 DIAGNOSIS — M5440 Lumbago with sciatica, unspecified side: Secondary | ICD-10-CM

## 2018-07-27 DIAGNOSIS — M25562 Pain in left knee: Secondary | ICD-10-CM

## 2018-07-27 DIAGNOSIS — M79672 Pain in left foot: Secondary | ICD-10-CM | POA: Diagnosis not present

## 2018-07-27 DIAGNOSIS — M5136 Other intervertebral disc degeneration, lumbar region: Secondary | ICD-10-CM | POA: Diagnosis not present

## 2018-07-27 DIAGNOSIS — M797 Fibromyalgia: Secondary | ICD-10-CM | POA: Diagnosis not present

## 2018-07-27 DIAGNOSIS — M79671 Pain in right foot: Secondary | ICD-10-CM

## 2018-07-27 DIAGNOSIS — G894 Chronic pain syndrome: Secondary | ICD-10-CM | POA: Diagnosis not present

## 2018-07-27 MED ORDER — OXYCODONE-ACETAMINOPHEN 10-325 MG PO TABS: 1.0000 | ORAL_TABLET | Freq: Four times a day (QID) | ORAL | 0 refills | Status: DC | PRN

## 2018-07-27 NOTE — Progress Notes (Signed)
Pain Management Virtual Encounter Note - Virtual Visit via Video Conference Telehealth (real-time audio visits between healthcare provider and patient).  Patient's Phone No. & Preferred Pharmacy:  [phone] (home); [phone] (mobile); (Preferred) [phone] [email or web address]  [facility] - [place], [place] - [address] [place] [address] Phone: [phone] Fax: [phone]   Pre-screening note:  Our staff contacted [name] and offered her an "in person", "face-to-face" appointment versus a telephone encounter. She indicated preferring the telephone encounter, at this time.  Reason for Virtual Visit: COVID-19*  Social distancing based on CDC and AMA recommendations.   I contacted Lauren Greene on 07/27/2018 at 10:05 AM via video conference.      I clearly identified myself as Edward JollyBilal Demetruis Depaul, MD. I verified that I was speaking with the correct person using two identifiers (Name and date of birth: 03/20/1965).  Advanced Informed Consent I sought verbal advanced consent from Lauren HakeElaine G Mcneeley for virtual visit interactions. I informed [name] of possible security and privacy concerns, risks, and limitations associated with providing "not-in-person" medical evaluation and management services. I also informed [name] of the availability of "in-person" appointments. Finally, I informed her that there would be a charge for the virtual visit and that she could be  personally, fully or partially, financially responsible for it. [name] expressed understanding and agreed to proceed.   Historic Elements   Lauren Greene is a 53 y.o. year old, female patient evaluated today after her last encounter by our practice on 07/22/2018. [name]  has a past medical history of ADHD (attention deficit hyperactivity disorder), Arthritis, Back pain, Broken femur (HCC) (08/27/12), Migraine, Osteoarthritis, Raynaud disease, and Raynaud phenomenon  (04/06/2018). She also  has a past surgical history that includes Total knee arthroplasty; Heel spur surgery; Back surgery; IM nailing femoral shaft fracture (Left, 08/27/2012); Femur IM nail (Left, 08/27/2012); Cervical biopsy w/ loop electrode excision (1990's); Endometrial biopsy (04-05-05 and 02-12-06); Tubal ligation; Shoulder surgery (Left); and Vaginal hysterectomy. [name] has a current medication list which includes the following prescription(s): amlodipine, amphetamine-dextroamphetamine, clobetasol ointment, diazepam, diclofenac sodium cr, duloxetine, lidocaine, lidocaine, nystatin, omeprazole, oxybutynin chloride, oxycodone-acetaminophen, trazodone, oxycodone-acetaminophen, and sulfacetamide. She  reports that she quit smoking about 17 years ago. Her smoking use included cigarettes. She quit after 10.00 years of use. She has never used smokeless tobacco. She reports current alcohol use. She reports that she does not use drugs. [name] is allergic to wellbutrin [bupropion] and lyrica [pregabalin].   HPI  I last communicated with her on 04/17/2018. Today, she is being contacted for medication management.   Pharmacotherapy Assessment   07/04/2018  1   06/25/2018  Oxycodone-Acetaminophen 10-325  120.00 30 Cr Kin   [id number]   Car (9744)   0  60.00 MME  Comm Ins   [place]     Monitoring: Pharmacotherapy: No side-effects or adverse reactions reported. [place] PMP: PDMP reviewed during this encounter.          Compliance: No problems identified or detected. Plan: Refer to "POC".  Review of recent tests  DG Wrist Complete Left CLINICAL DATA:  53 year old female. No injury. Increasing left wrist pain around distal ulna. Initial encounter.  EXAM: LEFT WRIST - COMPLETE 3+ VIEW  COMPARISON:  01/30/2009 left thumb MR.  FINDINGS: Mild degenerative changes distal radial lunate articulation. Slight increase size of interspace between the lunate and scaphoid may indicate ligamentous laxity and/or  result of remote ligamentous injury. Minimal spur triquetrum. Moderate  left first metacarpal phalangeal joint degenerative changes. No discrete osseous abnormality of the distal ulnar. No fracture or dislocation.  IMPRESSION: 1. Mild degenerative changes distal radial lunate articulation. 2. Slight increase size of interspace between the lunate and scaphoid may indicate ligamentous laxity and/or result of remote ligamentous injury. 3. Minimal spur triquetrum. 4. Moderate left first metacarpal phalangeal joint degenerative changes. 5. No discrete osseous abnormality of the distal ulnar.  Electronically Signed   By: Lacy Duverney M.D.   On: 01/12/2018 08:37   Office Visit on 06/04/2018  Component Date Value Ref Range Status  . Summary 06/04/2018 FINAL   Final   Comment: ==================================================================== TOXASSURE SELECT 13 (MW) ==================================================================== Test                             Result       Flag       Units Drug Present and Declared for Prescription Verification   Amphetamine                    >3106        EXPECTED   ng/mg creat    Amphetamine is available as a schedule II prescription drug.   Oxycodone                      >3106        EXPECTED   ng/mg creat   Oxymorphone                    2325         EXPECTED   ng/mg creat   Noroxycodone                   >3106        EXPECTED   ng/mg creat   Noroxymorphone                 378          EXPECTED   ng/mg creat    Sources of oxycodone are scheduled prescription medications.    Oxymorphone, noroxycodone, and noroxymorphone are expected    metabolites of oxycodone. Oxymorphone is also available as a    scheduled prescription medication. Drug Absent but Declared for Prescription Verification   Diazepam                                                 Not Detected UNEXPECTED ng/mg  creat ==================================================================== Test                      Result    Flag   Units      Ref Range   Creatinine              322              mg/dL      >=45 ==================================================================== Declared Medications:  The flagging and interpretation on this report are based on the  following declared medications.  Unexpected results may arise from  inaccuracies in the declared medications.  **Note: The testing scope of this panel includes these medications:  Amphetamine (Amphetamine-Dextroamphetamine)  Diazepam  Oxycodone (Percocet)  **Note: The testing scope of this panel does not include following  reported medications:  Acetaminophen (Percocet)  Amlodipine  Clobetasol  Diclofenac  Duloxetine  Eye Drop (Sulfacetamide)  Lidocaine  Methocarbamol  Nystatin  Omeprazole  Oxybutynin  Sulfamethoxazole (Bactrim)  Trazodone (Desyrel)  Trimeth                          oprim (Bactrim) ==================================================================== For clinical consultation, please call [phone]. ====================================================================    Assessment  The primary encounter diagnosis was Lumbar spondylosis. Diagnoses of Fibromyalgia, Foot pain, bilateral, Chronic arthralgias of knees and hips, Chronic pain syndrome, Chronic bilateral low back pain with sciatica, sciatica laterality unspecified, Long term prescription opiate use, Lumbar degenerative disc disease, Long term current use of opiate analgesic, and Lumbar facet arthropathy were also pertinent to this visit.  Plan of Care  I am having Maurie Musco. Lauren Caprice start on oxyCODONE-acetaminophen. I am also having her maintain her omeprazole, lidocaine, lidocaine, diazepam, sulfacetamide, Diclofenac Sodium CR, Oxybutynin Chloride, clobetasol ointment, DULoxetine, traZODone, nystatin, amLODipine, amphetamine-dextroamphetamine, and  oxyCODONE-acetaminophen.  Medication refill as below. Patient states that diagnostic lumbar facet blocks were effective short term for her pain (approximatey 80% for a week and a half).  We discussed the next steps being repeat lumbar facet medial branch nerve block #2 prior to doing lumbar facet radiofrequency ablation which I discussed with the patient would likely provide longer lasting pain relief for axial low back and hip pain due to lumbar spondylosis and facet arthropathy.  We will discuss this further when the patient follows up in mid July for medication management.  I will conduct physical exam at that time and also discussed scheduling her for diagnostic lumbar facet medial branch nerve block #2.    Pharmacotherapy (Medications Ordered): Meds ordered this encounter  Medications  . oxyCODONE-acetaminophen (PERCOCET) 10-325 MG tablet    Sig: Take 1 tablet by mouth every 6 (six) hours as needed for up to 30 days for pain.    Dispense:  120 tablet    Refill:  0    Do not add this medication to the electronic "Automatic Refill" notification system. Patient may have prescription filled one day early if pharmacy is closed on scheduled refill date.  Marland Kitchen oxyCODONE-acetaminophen (PERCOCET) 10-325 MG tablet    Sig: Take 1 tablet by mouth every 6 (six) hours as needed for up to 30 days for pain. Must last 30 days.    Dispense:  120 tablet    Refill:  0    [place] STOP ACT - Not applicable. Fill one day early if pharmacy is closed on scheduled refill date.   Orders:  No orders of the defined types were placed in this encounter.  Follow-up plan:   Return in about 9 weeks (around 09/28/2018) for Medication Management (in person).   I discussed the assessment and treatment plan with the patient. The patient was provided an opportunity to ask questions and all were answered. The patient agreed with the plan and demonstrated an understanding of the instructions.  Patient advised to call back or seek an  in-person evaluation if the symptoms or condition worsens.  Total duration of non-face-to-face encounter:25 minutes.  Note by: Edward Jolly, MD Date: 07/27/2018; Time: 10:05 AM  Disclaimer:  * Given the special circumstances of the COVID-19 pandemic, the federal government has announced that the Office for Civil Rights (OCR) will exercise its enforcement discretion and will not impose penalties on physicians using telehealth in the event of noncompliance with regulatory requirements under the [employer] Portability and Accountability Act (HIPAA) in connection  with the good faith provision of telehealth during the RKVTX-52 national public health emergency. (AMA)

## 2018-08-04 ENCOUNTER — Other Ambulatory Visit: Payer: Self-pay | Admitting: Nurse Practitioner

## 2018-08-04 ENCOUNTER — Other Ambulatory Visit: Payer: Self-pay | Admitting: Family Medicine

## 2018-08-06 ENCOUNTER — Other Ambulatory Visit: Payer: Self-pay

## 2018-08-06 ENCOUNTER — Ambulatory Visit (INDEPENDENT_AMBULATORY_CARE_PROVIDER_SITE_OTHER): Payer: PPO | Admitting: Family Medicine

## 2018-08-06 DIAGNOSIS — L03032 Cellulitis of left toe: Secondary | ICD-10-CM

## 2018-08-06 DIAGNOSIS — I7300 Raynaud's syndrome without gangrene: Secondary | ICD-10-CM | POA: Diagnosis not present

## 2018-08-06 MED ORDER — DOXYCYCLINE HYCLATE 100 MG PO TABS: 100.0000 mg | ORAL_TABLET | Freq: Two times a day (BID) | ORAL | 0 refills | Status: DC

## 2018-08-06 NOTE — Progress Notes (Signed)
   Subjective:    Patient ID: Lauren Greene, female    DOB: Oct 14, 1965, 53 y.o.   MRN: [id number]  HPIhaving pain and swelling in 3rd toe. Started 2 days ago.  Significant erythema of the third toe on the left foot causing pain and discomfort some blueness with it as well as redness and tenderness not draining any pus  Patient was seen by a podiatry specialist who diagnosed her with rainouts disease.  We have been treating her with amlodipine but recently she had this toe become inflamed painful tender and draining some liquid she denied any pus with it   Review of Systems  Constitutional: Negative for activity change, appetite change and fatigue.  HENT: Negative for congestion.   Respiratory: Negative for cough.   Cardiovascular: Negative for chest pain.  Gastrointestinal: Negative for abdominal pain.  Skin: Negative for color change.  Neurological: Negative for headaches.  Psychiatric/Behavioral: Negative for behavioral problems.       Objective:   Physical Exam Constitutional:      Appearance: Normal appearance.  HENT:     Head: Normocephalic and atraumatic.  Eyes:     General:        Right eye: No discharge.        Left eye: No discharge.  Neurological:     General: No focal deficit present.     Mental Status: She is alert.     Motor: No weakness.  Psychiatric:        Mood and Affect: Mood normal.        Behavior: Behavior normal.     She has some drainage from a scrape or an open wound on top of the third toe with some redness and tenderness it is not cyanotic she does have some areas of cyanosis on her other toes but I do not see any evidence of any tissue breakdown      Assessment & Plan:  She would benefit from my opinion with a vascular doctor to make sure that she is not having circulation issues into her foot and toes  She can continue the amlodipine to help treat for the possibility of rainouts but I am more concerned with a circulation issue   Cellulitis of the toe antibiotics prescribed warning signs discussed follow-up if problems-we will cover for MRSA

## 2018-08-07 ENCOUNTER — Other Ambulatory Visit: Payer: Self-pay

## 2018-08-07 NOTE — Addendum Note (Signed)
Addended by: Marlowe Shores on: 08/07/2018 09:25 AM   Modules accepted: Orders

## 2018-08-07 NOTE — Progress Notes (Signed)
Referral placed.

## 2018-08-19 ENCOUNTER — Other Ambulatory Visit: Payer: Self-pay

## 2018-08-19 DIAGNOSIS — L03116 Cellulitis of left lower limb: Secondary | ICD-10-CM

## 2018-08-19 DIAGNOSIS — I7301 Raynaud's syndrome with gangrene: Secondary | ICD-10-CM

## 2018-08-25 ENCOUNTER — Telehealth (HOSPITAL_COMMUNITY): Payer: Self-pay | Admitting: Rehabilitation

## 2018-08-25 NOTE — Telephone Encounter (Signed)

## 2018-08-26 ENCOUNTER — Encounter: Payer: PPO | Admitting: Vascular Surgery

## 2018-08-26 ENCOUNTER — Inpatient Hospital Stay (HOSPITAL_COMMUNITY): Admission: RE | Admit: 2018-08-26 | Payer: PPO | Source: Ambulatory Visit

## 2018-09-01 NOTE — Progress Notes (Signed)
Virtual Visit via Video Note  I connected with Lauren Greene on 09/03/18 at  2:00 PM EDT by a video enabled telemedicine application and verified that I am speaking with the correct person using two identifiers.   I discussed the limitations of evaluation and management by telemedicine and the availability of in person appointments. The patient expressed understanding and agreed to proceed.     I discussed the assessment and treatment plan with the patient. The patient was provided an opportunity to ask questions and all were answered. The patient agreed with the plan and demonstrated an understanding of the instructions.   The patient was advised to call back or seek an in-person evaluation if the symptoms worsen or if the condition fails to improve as anticipated.  I provided 15 minutes of non-face-to-face time during this encounter.   Lauren Greene Lauren Bergevin, MD     [facility] Progress Note  09/03/2018 3:04 PM Lauren Hakelaine G Galyean  MRN:  [id number]  Chief Complaint:  Chief Complaint    Depression; Follow-up     HPI:  This is a follow-up appointment for depression.  She checked in 40 minutes late for 20 minutes appointment.  She was initially driving a car; she agreed to pull over her car after discussion.  She states that "everything is great." She has two grandchildren this year. She returned to work on May 2nd at [employer]. She has been busy working and taking care of her grandchild. She enjoys doing gardening. She has not been able to do any physical exercise due to back pain. She denies feeling depressed. She sleeps well as long as she takes Trazodone. She has fair concentration.  She has occasional anxiety and feels tense.  She denies panic attacks.  She rarely takes Valium for anxiety.     Visit Diagnosis:    ICD-10-CM   1. MDD (major depressive disorder), recurrent, in partial remission (HCC)  F33.41     Past Psychiatric History: Please see initial evaluation for full  details. I have reviewed the history. No updates at this time.     Past Medical History:  Past Medical History:  Diagnosis Date  . ADHD (attention deficit hyperactivity disorder)   . Arthritis   . Back pain   . Broken femur (HCC) 08/27/12  . Migraine   . Osteoarthritis   . Raynaud disease   . Raynaud phenomenon 04/06/2018   [facility]    Past Surgical History:  Procedure Laterality Date  . BACK SURGERY    . CERVICAL BIOPSY  W/ LOOP ELECTRODE EXCISION  1990's   [place]  . ENDOMETRIAL BIOPSY  04-05-05 and 02-12-06   04-05-05 benign but too rapid growth. RXd Provera and repeat Bx 77mo. 02-12-06 Bx benign--Dr. Leda QuailSuzanne Greene  . FEMUR IM NAIL Left 08/27/2012   Procedure: INTRAMEDULLARY (IM) RETROGRADE FEMORAL NAILING;  Surgeon: Kathryne Hitchhristopher Y Blackman, MD;  Location: [facility] OR;  Service: Orthopedics;  Laterality: Left;  . HEEL SPUR SURGERY    . IM NAILING FEMORAL SHAFT FRACTURE Left 08/27/2012   Dr Magnus IvanBLACKMAN  . SHOULDER SURGERY Left   . TOTAL KNEE ARTHROPLASTY     x2  . TUBAL LIGATION    . VAGINAL HYSTERECTOMY      Family Psychiatric History: Please see initial evaluation for full details. I have reviewed the history. No updates at this time.     Family History:  Family History  Problem Relation Age of Onset  . Cancer Mother   .  Breast cancer Mother   . Bipolar disorder Mother   . Heart failure Father   . Cancer Father   . Cancer Other   . Heart failure Other   . Bipolar disorder Sister     Social History:  Social History   Socioeconomic History  . Marital status: Single    Spouse name: Not on file  . Number of children: Not on file  . Years of education: Not on file  . Highest education level: Not on file  Occupational History  . Not on file  Social Needs  . Financial resource strain: Not on file  . Food insecurity    Worry: Not on file    Inability: Not on file  . Transportation needs    Medical: Not on file    Non-medical: Not on  file  Tobacco Use  . Smoking status: Former Smoker    Years: 10.00    Types: Cigarettes    Quit date: 08/16/2000    Years since quitting: 18.0  . Smokeless tobacco: Never Used  Substance and Sexual Activity  . Alcohol use: Yes    Alcohol/week: 0.0 standard drinks    Comment: rarely  . Drug use: No  . Sexual activity: Not Currently    Partners: Female  Lifestyle  . Physical activity    Days per week: Not on file    Minutes per session: Not on file  . Stress: Not on file  Relationships  . Social Musicianconnections    Talks on phone: Not on file    Gets together: Not on file    Attends religious service: Not on file    Active member of club or organization: Not on file    Attends meetings of clubs or organizations: Not on file    Relationship status: Not on file  Other Topics Concern  . Not on file  Social History Narrative  . Not on file    Allergies:  Allergies  Allergen Reactions  . Wellbutrin [Bupropion]     Anger issues  . Lyrica [Pregabalin] Swelling    Leg swelling    Metabolic Disorder Labs: Lab Results  Component Value Date   HGBA1C 5.6 07/22/2014   MPG 103 02/16/2013   MPG 111 07/24/2010   No results found for: PROLACTIN Lab Results  Component Value Date   CHOL 185 01/09/2018   TRIG 102 01/09/2018   HDL 50 01/09/2018   CHOLHDL 3.7 01/09/2018   VLDL 13 07/10/2012   LDLCALC 115 (H) 01/09/2018   LDLCALC 116 (H) 03/15/2017   Lab Results  Component Value Date   TSH 4.104 06/24/2013   TSH 4.142 01/07/2013    Therapeutic Level Labs: No results found for: LITHIUM No results found for: VALPROATE No components found for:  CBMZ  Current Medications: Current Outpatient Medications  Medication Sig Dispense Refill  . amLODipine (NORVASC) 2.5 MG tablet Take 1 tablet (2.5 mg total) by mouth daily. 30 tablet 5  . amphetamine-dextroamphetamine (ADDERALL XR) 30 MG 24 hr capsule 2 q am 60 capsule 0  . clobetasol ointment (TEMOVATE) 0.05 % Apply 1 application  topically as needed. 30 g 0  . diazepam (VALIUM) 2 MG tablet Take 1 tablet (2 mg total) by mouth daily as needed for anxiety. 30 tablet 0  . Diclofenac Sodium CR 100 MG 24 hr tablet TAKE 1 TABLET BY MOUTH TWICE DAILY. TAKE WITH FOOD. 60 tablet 4  . doxycycline (VIBRA-TABS) 100 MG tablet Take 1 tablet (100 mg total) by  mouth 2 (two) times daily. 20 tablet 0  . [START ON 11/03/2018] DULoxetine (CYMBALTA) 60 MG capsule Take 2 capsules (120 mg total) by mouth daily. 180 capsule 0  . lidocaine (XYLOCAINE) 2 % jelly Apply 1 application topically 2 (two) times daily. 4 mL 1  . lidocaine (XYLOCAINE) 2 % solution TAKE 5 TO 10 MLS BY MOUTH TWICE DAILY AS NEEDED. 100 mL 0  . nystatin (MYCOSTATIN/NYSTOP) powder APPLY TO AFFECTED AREA(S) TOPICALLY TWICE DAILY. 45 g 6  . omeprazole (PRILOSEC) 40 MG capsule TAKE (1) CAPSULE BY MOUTH ONCE DAILY. 30 capsule 0  . Oxybutynin Chloride 10 % GEL Apply daily to skin for overactive bladder 1 g 6  . oxyCODONE-acetaminophen (PERCOCET) 10-325 MG tablet Take 1 tablet by mouth every 6 (six) hours as needed for up to 30 days for pain. Must last 30 days. 120 tablet 0  . sulfacetamide (BLEPH-10) 10 % ophthalmic solution 2 drops to the affected eye qid for 3-5 days 15 mL 0  . [START ON 11/03/2018] traZODone (DESYREL) 100 MG tablet 100-200 mg at night as needed for sleep 180 tablet 0   No current facility-administered medications for this visit.      Musculoskeletal: Strength & Muscle Tone: N/A Gait & Station: N/A Patient leans: N/A  Psychiatric Specialty Exam: Review of Systems  Psychiatric/Behavioral: Positive for depression. Negative for hallucinations, memory loss, substance abuse and suicidal ideas. The patient is nervous/anxious. The patient does not have insomnia.   All other systems reviewed and are negative.   Last menstrual period 03/18/2004.There is no height or weight on file to calculate BMI.  General Appearance: Fairly Groomed  Eye Contact:  Good  Speech:   Clear and Coherent  Volume:  Normal  Mood:  "fine"  Affect:  Appropriate and Congruent  Thought Process:  Coherent  Orientation:  Full (Time, Place, and Person)  Thought Content: Logical   Suicidal Thoughts:  No  Homicidal Thoughts:  No  Memory:  Immediate;   Good  Judgement:  Good  Insight:  Fair  Psychomotor Activity:  Normal  Concentration:  Concentration: Good and Attention Span: Good  Recall:  Good  Fund of Knowledge: Good  Language: Good  Akathisia:  No  Handed:  Right  AIMS (if indicated): not done  Assets:  Communication Skills Desire for Improvement  ADL's:  Intact  Cognition: WNL  Sleep:  Fair   Screenings: PHQ2-9     [facility] Clinical Support from 02/26/2018 [facility] Procedure [facility] Clinical Support [facility]22[facility]2017 [facility] Clinical [facility]  PHQ-2 Total Score  0  0  0  0  0       Assessment and Plan:  Lauren Hakelaine G Petrosino is a 53 y.o. year old female with a history of depression, ADHD by history, anxiety, hyperhydrosis, chronic pain, who presents for follow up appointment for depression.   # MDD in partial remission She denies significant symptoms since the last visit.  Will continue duloxetine to target depression and pain.  Discussed risk of hypertension.  Will continue trazodone as needed for insomnia.  Will continue Valium as needed for anxiety.  Discussed risk of dependence and oversedation.  Discussed behavioral activation.   # ADHD She was diagnosed with ADHD by PCP; she reports  good benefit from Adderall.  She is informed again that Adderall can cause Raynaud's phenomenon although it is not a common side effect.  She is advised to discuss  with her PCP for further evaluation.   Plan I have reviewed and updated plans as below 1 Continue duloxetine 120 mg daily  2. Continue Trazodone 100 - 200 mg at night as needed for sleep 3.Continue valium 2 mg daily as needed for anxiety (she declines refill at this time) 4.Return to clinic on 10/12 at 1 PM for 20 mins, video (Patient is on Adderall XR60 mg daily prescribed by her PCP)  The patient demonstrates the following risk factors for suicide: Chronic risk factors for suicide include: psychiatric disorder of depressionand chronic pain. Acute risk factorsfor suicide include: loss (financial, interpersonal, professional). Protective factorsfor this patient include: responsibility to others (children, family), coping skills and hope for the future. Considering these factors, the overall suicide risk at this point appears to be low. Patient isappropriate for outpatient follow up.  Norman Clay, MD 09/03/2018, 3:04 PM

## 2018-09-03 ENCOUNTER — Encounter (HOSPITAL_COMMUNITY): Payer: Self-pay | Admitting: Psychiatry

## 2018-09-03 ENCOUNTER — Telehealth (HOSPITAL_COMMUNITY): Payer: Self-pay | Admitting: Psychiatry

## 2018-09-03 ENCOUNTER — Ambulatory Visit (INDEPENDENT_AMBULATORY_CARE_PROVIDER_SITE_OTHER): Payer: PPO | Admitting: Psychiatry

## 2018-09-03 ENCOUNTER — Other Ambulatory Visit: Payer: Self-pay

## 2018-09-03 DIAGNOSIS — F3341 Major depressive disorder, recurrent, in partial remission: Secondary | ICD-10-CM

## 2018-09-03 MED ORDER — TRAZODONE HCL 100 MG PO TABS: ORAL_TABLET | ORAL | 0 refills | Status: DC

## 2018-09-03 MED ORDER — DULOXETINE HCL 60 MG PO CPEP: 120.0000 mg | ORAL_CAPSULE | Freq: Every day | ORAL | 0 refills | Status: DC

## 2018-09-03 NOTE — Telephone Encounter (Signed)
Called twice for the appointment today. She did not answer the phone. Left voice message to contact the office.  

## 2018-09-14 DIAGNOSIS — W231XXA Caught, crushed, jammed, or pinched between stationary objects, initial encounter: Secondary | ICD-10-CM | POA: Diagnosis not present

## 2018-09-14 DIAGNOSIS — S63253A Unspecified dislocation of left middle finger, initial encounter: Secondary | ICD-10-CM | POA: Diagnosis not present

## 2018-09-14 DIAGNOSIS — Z79899 Other long term (current) drug therapy: Secondary | ICD-10-CM | POA: Diagnosis not present

## 2018-09-14 DIAGNOSIS — S63283A Dislocation of proximal interphalangeal joint of left middle finger, initial encounter: Secondary | ICD-10-CM | POA: Diagnosis not present

## 2018-09-26 ENCOUNTER — Other Ambulatory Visit: Payer: Self-pay | Admitting: Family Medicine

## 2018-09-28 ENCOUNTER — Ambulatory Visit
Payer: PPO | Attending: Student in an Organized Health Care Education/Training Program | Admitting: Student in an Organized Health Care Education/Training Program

## 2018-09-28 ENCOUNTER — Encounter: Payer: Self-pay | Admitting: Student in an Organized Health Care Education/Training Program

## 2018-09-28 ENCOUNTER — Other Ambulatory Visit: Payer: Self-pay

## 2018-09-28 DIAGNOSIS — M25552 Pain in left hip: Secondary | ICD-10-CM

## 2018-09-28 DIAGNOSIS — M47816 Spondylosis without myelopathy or radiculopathy, lumbar region: Secondary | ICD-10-CM

## 2018-09-28 DIAGNOSIS — G8929 Other chronic pain: Secondary | ICD-10-CM | POA: Diagnosis not present

## 2018-09-28 DIAGNOSIS — M25551 Pain in right hip: Secondary | ICD-10-CM

## 2018-09-28 DIAGNOSIS — M797 Fibromyalgia: Secondary | ICD-10-CM

## 2018-09-28 DIAGNOSIS — G894 Chronic pain syndrome: Secondary | ICD-10-CM | POA: Diagnosis not present

## 2018-09-28 DIAGNOSIS — M19032 Primary osteoarthritis, left wrist: Secondary | ICD-10-CM | POA: Diagnosis not present

## 2018-09-28 DIAGNOSIS — M25562 Pain in left knee: Secondary | ICD-10-CM

## 2018-09-28 DIAGNOSIS — M5440 Lumbago with sciatica, unspecified side: Secondary | ICD-10-CM | POA: Diagnosis not present

## 2018-09-28 DIAGNOSIS — M25561 Pain in right knee: Secondary | ICD-10-CM | POA: Diagnosis not present

## 2018-09-28 DIAGNOSIS — Z79891 Long term (current) use of opiate analgesic: Secondary | ICD-10-CM

## 2018-09-28 MED ORDER — OXYCODONE-ACETAMINOPHEN 10-325 MG PO TABS: 1.0000 | ORAL_TABLET | Freq: Four times a day (QID) | ORAL | 0 refills | Status: DC | PRN

## 2018-09-28 NOTE — Progress Notes (Signed)
Pain Management Virtual Encounter Note - Virtual Visit via Telephone Telehealth (real-time audio visits between healthcare provider and patient).   Patient's Phone No. & Preferred Pharmacy:  [phone] (home); [phone] (mobile); (Preferred) [phone] [email or web address]  [facility] - [place], [place] [address] Phone: [phone] Fax: [phone]    Pre-screening note:  Our staff contacted Ms. Majkowski and offered her an "in person", "face-to-face" appointment versus a telephone encounter. She indicated preferring the telephone encounter, at this time.   Reason for Virtual Visit: COVID-19*  Social distancing based on CDC and AMA recommendations.   I contacted [name] on 09/28/2018 via telephone.      I clearly identified myself as Gillis Santa, MD. I verified that I was speaking with the correct person using two identifiers (Name: ZONIA CAPLIN, and date of birth: 1965/10/28).  Advanced Informed Consent I sought verbal advanced consent from [name] for virtual visit interactions. I informed Ms. Obeirne of possible security and privacy concerns, risks, and limitations associated with providing "not-in-person" medical evaluation and management services. I also informed Ms. Pasion of the availability of "in-person" appointments. Finally, I informed her that there would be a charge for the virtual visit and that she could be  personally, fully or partially, financially responsible for it. Ms. Corral expressed understanding and agreed to proceed.   Historic Elements   Ms. EDNAMAE SCHIANO is a 53 y.o. year old, female patient evaluated today after her last encounter by our practice on 08/04/2018. Ms. Soler  has a past medical history of ADHD (attention deficit hyperactivity disorder), Arthritis, Back pain, Broken femur ([place]) (08/27/12), Migraine, Osteoarthritis, Raynaud disease, and Raynaud phenomenon  (04/06/2018). She also  has a past surgical history that includes Total knee arthroplasty; Heel spur surgery; Back surgery; IM nailing femoral shaft fracture (Left, 08/27/2012); Femur IM nail (Left, 08/27/2012); Cervical biopsy w/ loop electrode excision (1990's); Endometrial biopsy (04-05-05 and 02-12-06); Tubal ligation; Shoulder surgery (Left); and Vaginal hysterectomy. Ms. Dor has a current medication list which includes the following prescription(s): amlodipine, amphetamine-dextroamphetamine, clobetasol ointment, diazepam, diclofenac sodium cr, duloxetine, lidocaine, lidocaine, nystatin, omeprazole, oxybutynin chloride, oxycodone-acetaminophen, oxycodone-acetaminophen, and trazodone. She  reports that she quit smoking about 18 years ago. Her smoking use included cigarettes. She quit after 10.00 years of use. She has never used smokeless tobacco. She reports current alcohol use. She reports that she does not use drugs. Ms. Ellerson is allergic to wellbutrin [bupropion] and lyrica [pregabalin].   HPI  Today, she is being contacted for medication management.  She is also endorsing increased left wrist pain due to wrist osteoarthritis.  We will refill medications as below and we discussed left wrist steroid injection.  Risks and benefits reviewed and patient would like to proceed.  Pharmacotherapy Assessment  Analgesic: 09/05/2018  1   07/27/2018  Oxycodone-Acetaminophen 10-325  120.00 30 Bi Lat   [id number]   Car (9744)   0  60.00 MME  Comm Ins   [place]   #29 tablets remaining  Monitoring: Pharmacotherapy: No side-effects or adverse reactions reported.  PMP: PDMP reviewed during this encounter.       Compliance: No problems identified. Effectiveness: Clinically acceptable. Plan: Refer to "POC".  Pertinent Labs   SAFETY SCREENING Profile Lab Results  Component Value Date   STAPHAUREUS NEGATIVE 08/27/2012   MRSAPCR NEGATIVE 08/27/2012   Renal Function Lab Results  Component Value Date   BUN  16 01/09/2018   CREATININE 0.70 01/09/2018  BCR 23 01/09/2018   GFRAA 114 01/09/2018   GFRNONAA 99 01/09/2018   Hepatic Function Lab Results  Component Value Date   AST 26 03/15/2017   ALT 26 03/15/2017   ALBUMIN 4.3 03/15/2017   UDS Summary  Date Value Ref Range Status  06/04/2018 FINAL  Final    Comment:    ==================================================================== TOXASSURE SELECT 13 (MW) ==================================================================== Test                             Result       Flag       Units Drug Present and Declared for Prescription Verification   Amphetamine                    >3106        EXPECTED   ng/mg creat    Amphetamine is available as a schedule II prescription drug.   Oxycodone                      >3106        EXPECTED   ng/mg creat   Oxymorphone                    2325         EXPECTED   ng/mg creat   Noroxycodone                   >3106        EXPECTED   ng/mg creat   Noroxymorphone                 378          EXPECTED   ng/mg creat    Sources of oxycodone are scheduled prescription medications.    Oxymorphone, noroxycodone, and noroxymorphone are expected    metabolites of oxycodone. Oxymorphone is also available as a    scheduled prescription medication. Drug Absent but Declared for Prescription Verification   Diazepam                       Not Detected UNEXPECTED ng/mg creat ==================================================================== Test                      Result    Flag   Units      Ref Range   Creatinine              322              mg/dL      >=96>=20 ==================================================================== Declared Medications:  The flagging and interpretation on this report are based on the  following declared medications.  Unexpected results may arise from  inaccuracies in the declared medications.  **Note: The testing scope of this panel includes these medications:  Amphetamine  (Amphetamine-Dextroamphetamine)  Diazepam  Oxycodone (Percocet)  **Note: The testing scope of this panel does not include following  reported medications:  Acetaminophen (Percocet)  Amlodipine  Clobetasol  Diclofenac  Duloxetine  Eye Drop (Sulfacetamide)  Lidocaine  Methocarbamol  Nystatin  Omeprazole  Oxybutynin  Sulfamethoxazole (Bactrim)  Trazodone (Desyrel)  Trimethoprim (Bactrim) ==================================================================== For clinical consultation, please call [phone]. ====================================================================    Note: Above Lab results reviewed.  Recent imaging  DG Wrist Complete Left CLINICAL DATA:  53 year old female. No injury. Increasing left wrist pain around distal ulna. Initial encounter.  EXAM: LEFT WRIST -  COMPLETE 3+ VIEW  COMPARISON:  01/30/2009 left thumb MR.  FINDINGS: Mild degenerative changes distal radial lunate articulation. Slight increase size of interspace between the lunate and scaphoid may indicate ligamentous laxity and/or result of remote ligamentous injury. Minimal spur triquetrum. Moderate left first metacarpal phalangeal joint degenerative changes. No discrete osseous abnormality of the distal ulnar. No fracture or dislocation.  IMPRESSION: 1. Mild degenerative changes distal radial lunate articulation. 2. Slight increase size of interspace between the lunate and scaphoid may indicate ligamentous laxity and/or result of remote ligamentous injury. 3. Minimal spur triquetrum. 4. Moderate left first metacarpal phalangeal joint degenerative changes. 5. No discrete osseous abnormality of the distal ulnar.  Electronically Signed   By: Lacy DuverneySteven  Olson M.D.   On: 01/12/2018 08:37  Assessment  The primary encounter diagnosis was Osteoarthritis of left wrist, unspecified osteoarthritis type. Diagnoses of Lumbar spondylosis, Chronic arthralgias of knees and hips, Fibromyalgia,  Chronic bilateral low back pain with sciatica, sciatica laterality unspecified, Lumbar facet arthropathy, Long term prescription opiate use, and Chronic pain syndrome were also pertinent to this visit.  Plan of Care  I have discontinued Gearldine Shownlaine G. Burleigh's sulfacetamide and doxycycline. I have also changed her oxyCODONE-acetaminophen. Additionally, I am having her start on oxyCODONE-acetaminophen. Lastly, I am having her maintain her lidocaine, lidocaine, diazepam, Diclofenac Sodium CR, Oxybutynin Chloride, clobetasol ointment, nystatin, amLODipine, amphetamine-dextroamphetamine, omeprazole, DULoxetine, and traZODone.  Pharmacotherapy (Medications Ordered): Meds ordered this encounter  Medications  . oxyCODONE-acetaminophen (PERCOCET) 10-325 MG tablet    Sig: Take 1 tablet by mouth every 6 (six) hours as needed for pain. Must last 30 days.    Dispense:  120 tablet    Refill:  0    [place] STOP ACT - Not applicable. Fill one day early if pharmacy is closed on scheduled refill date.  Marland Kitchen. oxyCODONE-acetaminophen (PERCOCET) 10-325 MG tablet    Sig: Take 1 tablet by mouth every 6 (six) hours as needed for pain. Must last 30 days.    Dispense:  120 tablet    Refill:  0    [place] STOP ACT - Not applicable. Fill one day early if pharmacy is closed on scheduled refill date.   Orders:  Orders Placed This Encounter  Procedures  . Medium Joint Injection/Arthrocentesis    Level(s): left wrist Laterality: left Sedation: none Purpose: Diagnostic Indication(s): Sub-acute pain    Standing Status:   Future    Standing Expiration Date:   03/30/2020    Scheduling Instructions:     Requested Scheduling Timeframe: The patient will call to schedule (PRN)   Follow-up plan:   Return in about 2 weeks (around 10/12/2018) for Procedure.        Recent Visits Date Type Provider Dept  07/27/18 Office Visit Edward JollyLateef, Genessis Flanary, MD [facility]  Showing recent visits within past 90 days and meeting all other  requirements   Today's Visits Date Type Provider Dept  09/28/18 Office Visit Edward JollyLateef, Trianna Lupien, MD [facility]  Showing today's visits and meeting all other requirements   Future Appointments No visits were found meeting these conditions.  Showing future appointments within next 90 days and meeting all other requirements   I discussed the assessment and treatment plan with the patient. The patient was provided an opportunity to ask questions and all were answered. The patient agreed with the plan and demonstrated an understanding of the instructions.  Patient advised to call back or seek an in-person evaluation if the symptoms or condition worsens.  Total duration of non-face-to-face  encounter: 25minutes.  Note by: Edward JollyBilal Wafa Martes, MD Date: 09/28/2018; Time: 1:39 PM  Note: This dictation was prepared with Dragon dictation. Any transcriptional errors that may result from this process are unintentional.  Disclaimer:  * Given the special circumstances of the COVID-19 pandemic, the federal government has announced that the Office for Civil Rights (OCR) will exercise its enforcement discretion and will not impose penalties on physicians using telehealth in the event of noncompliance with regulatory requirements under the [employer] Insurance Portability and Accountability Act (HIPAA) in connection with the good faith provision of telehealth during the COVID-19 national public health emergency. (AMA)

## 2018-10-02 ENCOUNTER — Other Ambulatory Visit: Payer: PPO

## 2018-10-05 ENCOUNTER — Other Ambulatory Visit: Admission: RE | Admit: 2018-10-05 | Payer: PPO | Source: Ambulatory Visit

## 2018-10-07 ENCOUNTER — Other Ambulatory Visit: Payer: Self-pay

## 2018-10-07 ENCOUNTER — Ambulatory Visit (HOSPITAL_BASED_OUTPATIENT_CLINIC_OR_DEPARTMENT_OTHER): Payer: PPO | Admitting: Student in an Organized Health Care Education/Training Program

## 2018-10-07 ENCOUNTER — Ambulatory Visit
Admission: RE | Admit: 2018-10-07 | Discharge: 2018-10-07 | Disposition: A | Discharge status: Home / Self Care | Payer: PPO | Source: Ambulatory Visit | Attending: Student in an Organized Health Care Education/Training Program | Admitting: Student in an Organized Health Care Education/Training Program

## 2018-10-07 ENCOUNTER — Encounter: Payer: Self-pay | Admitting: Student in an Organized Health Care Education/Training Program

## 2018-10-07 ENCOUNTER — Other Ambulatory Visit: Payer: Self-pay | Admitting: Family Medicine

## 2018-10-07 VITALS — BP 148/93 | HR 79 | Temp 98.4°F | Resp 16 | Ht 68.0 in | Wt 240.0 lb

## 2018-10-07 DIAGNOSIS — M19032 Primary osteoarthritis, left wrist: Secondary | ICD-10-CM | POA: Insufficient documentation

## 2018-10-07 MED ORDER — LIDOCAINE HCL 2 % IJ SOLN
20.0000 mL | Freq: Once | INTRAMUSCULAR | Status: AC
  Administered 2018-10-07: 400 mg

## 2018-10-07 MED ORDER — ROPIVACAINE HCL 2 MG/ML IJ SOLN
INTRAMUSCULAR | Status: AC
  Filled 2018-10-07: qty 10

## 2018-10-07 MED ORDER — LIDOCAINE HCL 2 % IJ SOLN
INTRAMUSCULAR | Status: AC
  Filled 2018-10-07: qty 20

## 2018-10-07 MED ORDER — DEXAMETHASONE SODIUM PHOSPHATE 10 MG/ML IJ SOLN
10.0000 mg | Freq: Once | INTRAMUSCULAR | Status: AC
  Administered 2018-10-07: 10 mg

## 2018-10-07 MED ORDER — DEXAMETHASONE SODIUM PHOSPHATE 10 MG/ML IJ SOLN
INTRAMUSCULAR | Status: AC
  Filled 2018-10-07: qty 1

## 2018-10-07 NOTE — Progress Notes (Signed)
Patient's Name: Lauren Greene  MRN: [id number]  Referring Provider: Babs SciaraLuking, Scott A, MD  DOB: 09/23/1965  PCP: Babs SciaraLuking, Scott A, MD  DOS: 10/07/2018  Note by: Edward JollyBilal Chancy Smigiel, MD  Service setting: Ambulatory outpatient  Specialty: Interventional Pain Management  Patient type: Established  Location: [facility] (AMB) Pain Management Facility  Visit type: Interventional Procedure   Primary Reason for Visit: Interventional Pain Management Treatment. CC: Wrist Pain (left) and Back Pain (lower)  Procedure:          Anesthesia, Analgesia, Anxiolysis:  Type: Diagnostic Wrist Steroid Injection  #1  Region: Dorsal Interspace between pisiform and ulnar head Level: Wrist Laterality: Left-Sided  Type: Local Anesthesia Indication(s): Analgesia         Local Anesthetic: Lidocaine 1-2% Route: Infiltration ([place]/IM) IV Access: Declined Sedation: Declined   Position: Sitting   Indications: 1. Osteoarthritis of left wrist, unspecified osteoarthritis type    Pain Score: Pre-procedure: 5 /10 Post-procedure: 5 /10   Pre-op Assessment:  Lauren Greene is a 53 y.o. (year old), female patient, seen today for interventional treatment. She  has a past surgical history that includes Total knee arthroplasty; Heel spur surgery; Back surgery; IM nailing femoral shaft fracture (Left, 08/27/2012); Femur IM nail (Left, 08/27/2012); Cervical biopsy w/ loop electrode excision (1990's); Endometrial biopsy (04-05-05 and 02-12-06); Tubal ligation; Shoulder surgery (Left); and Vaginal hysterectomy. Lauren Greene has a current medication list which includes the following prescription(s): amlodipine, amphetamine-dextroamphetamine, clobetasol ointment, diazepam, diclofenac sodium cr, duloxetine, lidocaine, lidocaine, nystatin, omeprazole, oxybutynin chloride, oxycodone-acetaminophen, oxycodone-acetaminophen, and trazodone. Her primarily concern today is the Wrist Pain (left) and Back Pain (lower)  Initial Vital Signs:  Pulse/HCG Rate: 79    Temp: 98.4 F (36.9 C) Resp: 16 BP: (!) 148/93 SpO2: 97 %  BMI: Estimated body mass index is 36.49 kg/m as calculated from the following:   Height as of this encounter: 5\' 8"  (1.727 m).   Weight as of this encounter: 240 lb (108.9 kg).  Risk Assessment: Allergies: Reviewed. She is allergic to wellbutrin [bupropion] and lyrica [pregabalin].  Allergy Precautions: None required Coagulopathies: Reviewed. None identified.  Blood-thinner therapy: None at this time Active Infection(s): Reviewed. None identified. Lauren Greene is afebrile  Site Confirmation: Lauren Greene was asked to confirm the procedure and laterality before marking the site Procedure checklist: Completed Consent: Before the procedure and under the influence of no sedative(s), amnesic(s), or anxiolytics, the patient was informed of the treatment options, risks and possible complications. To fulfill our ethical and legal obligations, as recommended by the American Medical Association's Code of Ethics, I have informed the patient of my clinical impression; the nature and purpose of the treatment or procedure; the risks, benefits, and possible complications of the intervention; the alternatives, including doing nothing; the risk(s) and benefit(s) of the alternative treatment(s) or procedure(s); and the risk(s) and benefit(s) of doing nothing. The patient was provided information about the general risks and possible complications associated with the procedure. These may include, but are not limited to: failure to achieve desired goals, infection, bleeding, organ or nerve damage, allergic reactions, paralysis, and death. In addition, the patient was informed of those risks and complications associated to the procedure, such as failure to decrease pain; infection; bleeding; organ or nerve damage with subsequent damage to sensory, motor, and/or autonomic systems, resulting in permanent pain, numbness, and/or weakness of one or several  areas of the body; allergic reactions; (i.e.: anaphylactic reaction); and/or death. Furthermore, the patient was informed of those risks and complications associated with  the medications. These include, but are not limited to: allergic reactions (i.e.: anaphylactic or anaphylactoid reaction(s)); adrenal axis suppression; blood sugar elevation that in diabetics may result in ketoacidosis or comma; water retention that in patients with history of congestive heart failure may result in shortness of breath, pulmonary edema, and decompensation with resultant heart failure; weight gain; swelling or edema; medication-induced neural toxicity; particulate matter embolism and blood vessel occlusion with resultant organ, and/or nervous system infarction; and/or aseptic necrosis of one or more joints. Finally, the patient was informed that Medicine is not an exact science; therefore, there is also the possibility of unforeseen or unpredictable risks and/or possible complications that may result in a catastrophic outcome. The patient indicated having understood very clearly. We have given the patient no guarantees and we have made no promises. Enough time was given to the patient to ask questions, all of which were answered to the patient's satisfaction. Lauren Greene has indicated that she wanted to continue with the procedure. Attestation: I, the ordering provider, attest that I have discussed with the patient the benefits, risks, side-effects, alternatives, likelihood of achieving goals, and potential problems during recovery for the procedure that I have provided informed consent. Date   Time: 10/07/2018 12:21 PM  Pre-Procedure Preparation:  Monitoring: As per clinic protocol. Respiration, ETCO2, SpO2, BP, heart rate and rhythm monitor placed and checked for adequate function Safety Precautions: Patient was assessed for positional comfort and pressure points before starting the procedure. Time-out: I initiated and  conducted the "Time-out" before starting the procedure, as per protocol. The patient was asked to participate by confirming the accuracy of the "Time Out" information. Verification of the correct person, site, and procedure were performed and confirmed by me, the nursing staff, and the patient. "Time-out" conducted as per Joint Commission's Universal Protocol (UP.01.01.01). Time: 1300  Description of Procedure:          Target Area: Space between pisiform and ulnar head Approach: Dorsal approach. Area Prepped: Entire wrist Region Prepping solution: DuraPrep (Iodine Povacrylex [0.7% available iodine] and Isopropyl Alcohol, 74% w/w) Safety Precautions: Aspiration looking for blood return was conducted prior to all injections. At no point did we inject any substances, as a needle was being advanced. No attempts were made at seeking any paresthesias. Safe injection practices and needle disposal techniques used. Medications properly checked for expiration dates. SDV (single dose vial) medications used. Description of the Procedure: Protocol guidelines were followed. The patient was placed in position. The target area was identified and the area prepped in the usual manner. Skin & deeper tissues infiltrated with local anesthetic. Appropriate amount of time allowed to pass for local anesthetics to take effect. The procedure needles were then advanced to the target area. Proper needle placement secured. Negative aspiration confirmed. Solution injected in intermittent fashion, asking for systemic symptoms every 0.5cc of injectate. The needles were then removed and the area cleansed, making sure to leave some of the prepping solution back to take advantage of its long term bactericidal properties.  Vitals:   10/07/18 1230  BP: (!) 148/93  Pulse: 79  Resp: 16  Temp: 98.4 F (36.9 C)  TempSrc: Oral  SpO2: 97%  Weight: 240 lb (108.9 kg)  Height: 5\' 8"  (1.727 m)    Start Time: 1300 hrs. End Time: 1304  hrs. Materials:  Needle(s) Type: Regular needle Gauge: 25G Length: 1.5-in Medication(s): Please see orders for medications and dosing details. 1 cc Decadron (10 mg/cc) injected into wrist joint space between ulnar head  and medial pisiform  Imaging Guidance:          Type of Imaging Technique: Fluoroscopy Guidance (Non-spinal) Indication(s): Assistance in needle guidance and placement for procedures requiring needle placement in or near specific anatomical locations not easily accessible without such assistance. Exposure Time: No patient exposure Contrast: None used. Fluoroscopic Guidance: To get into joint space accurately Ultrasound Guidance: N/A Interpretation: N/A  Antibiotic Prophylaxis:   Anti-infectives (From admission, onward)   None     Indication(s): None identified  Post-operative Assessment:  Post-procedure Vital Signs:  Pulse/HCG Rate: 79  Temp: 98.4 F (36.9 C) Resp: 16 BP: (!) 148/93 SpO2: 97 %  EBL: None  Complications: No immediate post-treatment complications observed by team, or reported by patient.  Note: The patient tolerated the entire procedure well. A repeat set of vitals were taken after the procedure and the patient was kept under observation following institutional policy, for this type of procedure. Post-procedural neurological assessment was performed, showing return to baseline, prior to discharge. The patient was provided with post-procedure discharge instructions, including a section on how to identify potential problems. Should any problems arise concerning this procedure, the patient was given instructions to immediately contact us, at any time, without hesitation. In any case, we plan to contact the patient by telephone for a follow-up status report regarding this interventional procedure.  Comments:  No additional relevant information.  Plan of Care  Orders:  Orders Placed This Encounter  Procedures   DG PAIN CLINIC C-ARM 1-60 MIN NO  REPORT    Intraoperative interpretation by procedural physician at [facility].    Standing Status:   Standing    Number of Occurrences:   1    Order Specific Question:   Reason for exam:    Answer:   Assistance in needle guidance and placement for procedures requiring needle placement in or near specific anatomical locations not easily accessible without such assistance.   Medications ordered for procedure: Meds ordered this encounter  Medications   lidocaine (XYLOCAINE) 2 % (with pres) injection 400 mg   dexamethasone (DECADRON) injection 10 mg   Medications administered: We administered lidocaine and dexamethasone.  See the medical record for exact dosing, route, and time of administration.  Follow-up plan:   Recent Visits Date Type Provider Dept  09/28/18 Office Visit Edward JollyLateef, Everlena Mackley, MD [facility]  07/27/18 Office Visit Edward JollyLateef, Akela Pocius, MD [facility]  Showing recent visits within past 90 days and meeting all other requirements   Today's Visits Date Type Provider Dept  10/07/18 Procedure visit Edward JollyLateef, Mayre Bury, MD [facility]  Showing today's visits and meeting all other requirements   Future Appointments Date Type Provider Dept  11/17/18 Appointment Edward JollyLateef, Ma Munoz, MD [facility]  Showing future appointments within next 90 days and meeting all other requirements   Disposition: Discharge home  Discharge Date & Time: 10/07/2018; 1310 hrs.   Primary Care Physician: Babs SciaraLuking, Scott A, MD [facility] Note by: Edward JollyBilal Shaliah Wann, MD Date: 10/07/2018; Time: 2:47 PM  Disclaimer:  Medicine is not an exact science. The only guarantee in medicine is that nothing is guaranteed. It is important to note that the decision to proceed with this intervention was based on the information collected from the patient. The Data and conclusions were drawn from the patient's questionnaire, the interview, and the  physical examination. Because the information was provided in large part by the patient, it cannot be guaranteed that it has not been  purposely or unconsciously manipulated. Every effort has been made to obtain as much relevant data as possible for this evaluation. It is important to note that the conclusions that lead to this procedure are derived in large part from the available data. Always take into account that the treatment will also be dependent on availability of resources and existing treatment guidelines, considered by other Pain Management Practitioners as being common knowledge and practice, at the time of the intervention. For Medico-Legal purposes, it is also important to point out that variation in procedural techniques and pharmacological choices are the acceptable norm. The indications, contraindications, technique, and results of the above procedure should only be interpreted and judged by a Board-Certified Interventional Pain Specialist with extensive familiarity and expertise in the same exact procedure and technique.

## 2018-10-07 NOTE — Patient Instructions (Signed)

## 2018-10-07 NOTE — Progress Notes (Signed)
Safety precautions to be maintained throughout the outpatient stay will include: orient to surroundings, keep bed in low position, maintain call bell within reach at all times, provide assistance with transfer out of bed and ambulation.  

## 2018-10-08 ENCOUNTER — Telehealth: Payer: Self-pay

## 2018-10-08 NOTE — Telephone Encounter (Signed)
No answer, left message to callif needed. 

## 2018-10-21 ENCOUNTER — Ambulatory Visit (HOSPITAL_COMMUNITY): Admission: RE | Admit: 2018-10-21 | Payer: PPO | Source: Ambulatory Visit

## 2018-10-21 ENCOUNTER — Encounter: Payer: PPO | Admitting: Vascular Surgery

## 2018-10-28 ENCOUNTER — Encounter (HOSPITAL_COMMUNITY): Payer: PPO

## 2018-10-28 ENCOUNTER — Encounter: Payer: PPO | Admitting: Vascular Surgery

## 2018-11-05 ENCOUNTER — Telehealth: Payer: Self-pay | Admitting: Family Medicine

## 2018-11-05 NOTE — Telephone Encounter (Signed)
Pt has virtual visit set for med check on 8/31. She will need a refill before appt.  amphetamine-dextroamphetamine (ADDERALL XR) 30 MG 24 hr capsule   [place], [place] - [place]

## 2018-11-05 NOTE — Telephone Encounter (Signed)
Last ADD check up 06/18/18. Has upcoming appt 8/31

## 2018-11-06 ENCOUNTER — Other Ambulatory Visit: Payer: Self-pay | Admitting: Family Medicine

## 2018-11-06 ENCOUNTER — Other Ambulatory Visit: Payer: Self-pay | Admitting: *Deleted

## 2018-11-06 MED ORDER — OMEPRAZOLE 40 MG PO CPDR: DELAYED_RELEASE_CAPSULE | ORAL | 0 refills | Status: DC

## 2018-11-06 MED ORDER — AMPHETAMINE-DEXTROAMPHET ER 30 MG PO CP24: ORAL_CAPSULE | ORAL | 0 refills | Status: DC

## 2018-11-06 NOTE — Telephone Encounter (Signed)
Pt.notified

## 2018-11-06 NOTE — Telephone Encounter (Signed)
Left message to return call 

## 2018-11-06 NOTE — Telephone Encounter (Signed)
Prescription was sent in keep follow-up visit

## 2018-11-14 ENCOUNTER — Other Ambulatory Visit: Payer: Self-pay

## 2018-11-16 ENCOUNTER — Other Ambulatory Visit: Payer: Self-pay

## 2018-11-16 ENCOUNTER — Other Ambulatory Visit: Payer: Self-pay | Admitting: *Deleted

## 2018-11-16 ENCOUNTER — Ambulatory Visit (INDEPENDENT_AMBULATORY_CARE_PROVIDER_SITE_OTHER): Payer: PPO | Admitting: Family Medicine

## 2018-11-16 ENCOUNTER — Encounter: Payer: Self-pay | Admitting: Student in an Organized Health Care Education/Training Program

## 2018-11-16 DIAGNOSIS — I7300 Raynaud's syndrome without gangrene: Secondary | ICD-10-CM

## 2018-11-16 DIAGNOSIS — E785 Hyperlipidemia, unspecified: Secondary | ICD-10-CM

## 2018-11-16 DIAGNOSIS — Z13220 Encounter for screening for lipoid disorders: Secondary | ICD-10-CM

## 2018-11-16 DIAGNOSIS — F988 Other specified behavioral and emotional disorders with onset usually occurring in childhood and adolescence: Secondary | ICD-10-CM

## 2018-11-16 DIAGNOSIS — Z79899 Other long term (current) drug therapy: Secondary | ICD-10-CM

## 2018-11-16 DIAGNOSIS — E7849 Other hyperlipidemia: Secondary | ICD-10-CM | POA: Insufficient documentation

## 2018-11-16 MED ORDER — AMPHETAMINE-DEXTROAMPHET ER 30 MG PO CP24: ORAL_CAPSULE | ORAL | 0 refills | Status: DC

## 2018-11-16 NOTE — Progress Notes (Signed)
   Subjective:    Patient ID: Lauren Greene, female    DOB: [birth year], 53 y.o.   MRN: [id number]  HPI Virtual visit Patient calls for a follow up on ADHD. Patient states she is doing well on current medication and is having no problems or concerns. Patient takes her medicine regular basis states it does help her function she denies any major setbacks with it.  Patient relates without medicine she has a hard time focusing and staying on track medicine does help her She understands medicine will not 100% improved her symptoms  Virtual Visit via Video Note  I connected with Lauren Greene on 11/16/18 at 11:00 AM EDT by a video enabled telemedicine application and verified that I am speaking with the correct person using two identifiers.  Location: Patient: home Provider: office   I discussed the limitations of evaluation and management by telemedicine and the availability of in person appointments. The patient expressed understanding and agreed to proceed.  History of Present Illness:    Observations/Objective:   Assessment and Plan:   Follow Up Instructions:    I discussed the assessment and treatment plan with the patient. The patient was provided an opportunity to ask questions and all were answered. The patient agreed with the plan and demonstrated an understanding of the instructions.   The patient was advised to call back or seek an in-person evaluation if the symptoms worsen or if the condition fails to improve as anticipated.  I provided 16 minutes of non-face-to-face time during this encounter.       Review of Systems  Constitutional: Negative for activity change, appetite change and fatigue.  HENT: Negative for congestion.   Respiratory: Negative for cough.   Cardiovascular: Negative for chest pain.  Gastrointestinal: Negative for abdominal pain.  Skin: Negative for color change.  Neurological: Negative for headaches.  Psychiatric/Behavioral: Negative  for behavioral problems.       Objective:   Physical Exam  Patient had virtual visit Appears to be in no distress Atraumatic Neuro able to relate and oriented No apparent resp distress Color normal       Assessment & Plan:  The patient was seen today as part of the visit regarding ADD. Medications were reviewed with the patient as well as compliance. Side effects were checked for. Discussion regarding effectiveness was held. Prescriptions were written. Patient reminded to follow-up in approximately 3 months. Behavioral and study issues were addressed.  Plans to [place] law with drug registry was checked and verified while present with the patient. Overall doing well with ADD related issues taking the medicine well drug registry checked prescription sent in

## 2018-11-17 ENCOUNTER — Encounter: Payer: Self-pay | Admitting: Student in an Organized Health Care Education/Training Program

## 2018-11-17 ENCOUNTER — Ambulatory Visit
Payer: PPO | Attending: Student in an Organized Health Care Education/Training Program | Admitting: Student in an Organized Health Care Education/Training Program

## 2018-11-17 ENCOUNTER — Other Ambulatory Visit: Payer: Self-pay

## 2018-11-17 DIAGNOSIS — M5136 Other intervertebral disc degeneration, lumbar region: Secondary | ICD-10-CM | POA: Diagnosis not present

## 2018-11-17 DIAGNOSIS — M25562 Pain in left knee: Secondary | ICD-10-CM

## 2018-11-17 DIAGNOSIS — M797 Fibromyalgia: Secondary | ICD-10-CM | POA: Diagnosis not present

## 2018-11-17 DIAGNOSIS — M5440 Lumbago with sciatica, unspecified side: Secondary | ICD-10-CM | POA: Diagnosis not present

## 2018-11-17 DIAGNOSIS — M25552 Pain in left hip: Secondary | ICD-10-CM

## 2018-11-17 DIAGNOSIS — M25551 Pain in right hip: Secondary | ICD-10-CM | POA: Diagnosis not present

## 2018-11-17 DIAGNOSIS — Z79891 Long term (current) use of opiate analgesic: Secondary | ICD-10-CM

## 2018-11-17 DIAGNOSIS — G8929 Other chronic pain: Secondary | ICD-10-CM

## 2018-11-17 DIAGNOSIS — M25561 Pain in right knee: Secondary | ICD-10-CM

## 2018-11-17 DIAGNOSIS — G894 Chronic pain syndrome: Secondary | ICD-10-CM

## 2018-11-17 DIAGNOSIS — M47816 Spondylosis without myelopathy or radiculopathy, lumbar region: Secondary | ICD-10-CM

## 2018-11-17 MED ORDER — OXYCODONE-ACETAMINOPHEN 10-325 MG PO TABS: 1.0000 | ORAL_TABLET | Freq: Four times a day (QID) | ORAL | 0 refills | Status: DC | PRN

## 2018-11-17 NOTE — Progress Notes (Signed)
Pain Management Virtual Encounter Note - Virtual Visit via Video Conference Telehealth (real-time audio visits between healthcare provider and Lauren Greene).   Lauren Greene's Phone No. & Preferred Pharmacy:  [phone] (home); [phone] (mobile); (Preferred) [phone] [email or web address]  [facility] - [place], [place] - [address] [place] [address] Phone: [phone] Fax: [phone]    Pre-screening note:  Our staff contacted Lauren Greene and offered her an "in person", "face-to-face" appointment versus a telephone encounter. She indicated preferring the telephone encounter, at this time.   Reason for Virtual Visit: COVID-19*  Social distancing based on CDC and AMA recommendations.   I contacted Lauren Greene on 11/17/2018 via video conference.      I clearly identified myself as Edward Jolly, MD. I verified that I was speaking with the correct person using two identifiers (Name: Lauren Greene, and date of birth: [birth year]).  Advanced Informed Consent I sought verbal advanced consent from Lauren Greene for virtual visit interactions. I informed Lauren Greene of possible security and privacy concerns, risks, and limitations associated with providing "not-in-person" medical evaluation and management services. I also informed Lauren Greene of the availability of "in-person" appointments. Finally, I informed her that there would be a charge for the virtual visit and that she could be  personally, fully or partially, financially responsible for it. Lauren Greene expressed understanding and agreed to proceed.   Historic Elements   Lauren Greene is a 53 y.o. year old, female Lauren Greene evaluated today after her last encounter by our practice on 10/08/2018. Lauren Greene  has a past medical history of ADHD (attention deficit hyperactivity disorder), Arthritis, Back pain, Broken femur (HCC) (08/27/12), Migraine, Osteoarthritis, Raynaud disease, and Raynaud  phenomenon (04/06/2018). She also  has a past surgical history that includes Total knee arthroplasty; Heel spur surgery; Back surgery; IM nailing femoral shaft fracture (Left, 08/27/2012); Femur IM nail (Left, 08/27/2012); Cervical biopsy w/ loop electrode excision (1990's); Endometrial biopsy (04-05-05 and 02-12-06); Tubal ligation; Shoulder surgery (Left); and Vaginal hysterectomy. Lauren Greene has a current medication list which includes the following prescription(s): amlodipine, clobetasol ointment, diazepam, diclofenac sodium cr, duloxetine, lidocaine, lidocaine, nystatin, omeprazole, oxybutynin chloride, oxycodone-acetaminophen, oxycodone-acetaminophen, trazodone, amphetamine-dextroamphetamine, amphetamine-dextroamphetamine, and amphetamine-dextroamphetamine. She  reports that she quit smoking about 18 years ago. Her smoking use included cigarettes. She quit after 10.00 years of use. She has never used smokeless tobacco. She reports current alcohol use. She reports that she does not use drugs. Lauren Greene is allergic to wellbutrin [bupropion] and lyrica [pregabalin].   HPI  Today, she is being contacted for medication management.   No change in medical history since last visit.  Lauren Greene's pain is at baseline.  Lauren Greene continues multimodal pain regimen as prescribed.  States that it provides pain relief and improvement in functional status.  Lauren Greene again requested to increase her Percocet.  I informed the Lauren Greene that we will not increase her dose and that her maximum dose will be maximum dose 60 MME which she is already on.  I did discuss drug holiday with her to help reduce her tolerance so that these medications can be more effective when they are reintroduced.  Lauren Greene declined this plan.  We also had a discussion on short acting and long-acting opioid analgesics.  I informed her of the risks of dual opioid therapy especially in the context of morbid obesity and concerns for sleep apnea.  If we were to  consider this in the future, Lauren Greene would need to wean her  current Percocet dose so that her total opioid dose does not exceed 60 MME.  Of note we did do a left wrist injection on 10/07/2018 for wrist osteoarthritis which was helpful for approximately 1 week with return of pain thereafter.  Pharmacotherapy Assessment  Analgesic: 10/15/2018  1   09/28/2018  Oxycodone-Acetaminophen 10-325  120.00 30 Bi Lat   [id number]   Car (9744)   0  60.00 MME  Comm Ins   [place]    Monitoring: Pharmacotherapy: No side-effects or adverse reactions reported. [place] PMP: PDMP reviewed during this encounter.       Compliance: No problems identified. Effectiveness: Clinically acceptable. Plan: Refer to "POC".  UDS:  Summary  Date Value Ref Range Status  06/04/2018 FINAL  Final    Comment:    ==================================================================== TOXASSURE SELECT 13 (MW) ==================================================================== Test                             Result       Flag       Units Drug Present and Declared for Prescription Verification   Amphetamine                    >3106        EXPECTED   ng/mg creat    Amphetamine is available as a schedule II prescription drug.   Oxycodone                      >3106        EXPECTED   ng/mg creat   Oxymorphone                    2325         EXPECTED   ng/mg creat   Noroxycodone                   >3106        EXPECTED   ng/mg creat   Noroxymorphone                 378          EXPECTED   ng/mg creat    Sources of oxycodone are scheduled prescription medications.    Oxymorphone, noroxycodone, and noroxymorphone are expected    metabolites of oxycodone. Oxymorphone is also available as a    scheduled prescription medication. Drug Absent but Declared for Prescription Verification   Diazepam                       Not Detected UNEXPECTED ng/mg creat ==================================================================== Test                       Result    Flag   Units      Ref Range   Creatinine              322              mg/dL      >=54>=20 ==================================================================== Declared Medications:  The flagging and interpretation on this report are based on the  following declared medications.  Unexpected results may arise from  inaccuracies in the declared medications.  **Note: The testing scope of this panel includes these medications:  Amphetamine (Amphetamine-Dextroamphetamine)  Diazepam  Oxycodone (Percocet)  **Note: The testing scope of this panel does not include following  reported medications:  Acetaminophen (Percocet)  Amlodipine  Clobetasol  Diclofenac  Duloxetine  Eye Drop (Sulfacetamide)  Lidocaine  Methocarbamol  Nystatin  Omeprazole  Oxybutynin  Sulfamethoxazole (Bactrim)  Trazodone (Desyrel)  Trimethoprim (Bactrim) ==================================================================== For clinical consultation, please call [phone]. ====================================================================    Laboratory Chemistry Profile (12 mo)  Renal: 01/09/2018: BUN 16; BUN/Creatinine Ratio 23; Creatinine, Ser 0.70  Lab Results  Component Value Date   GFRAA 114 01/09/2018   GFRNONAA 99 01/09/2018   Hepatic: No results found for requested labs within last 8760 hours. Lab Results  Component Value Date   AST 26 03/15/2017   ALT 26 03/15/2017   Other: 04/06/2018: Sed Rate 4 Note: Above Lab results reviewed.   Assessment  The primary encounter diagnosis was Long term current use of opiate analgesic. Diagnoses of Lumbar degenerative disc disease, Long term prescription opiate use, Lumbar spondylosis, Chronic arthralgias of knees and hips, Fibromyalgia, Chronic bilateral low back pain with sciatica, sciatica laterality unspecified, Lumbar facet arthropathy, and Chronic pain syndrome were also pertinent to this visit.  Plan of Care  I am having Daziya Redmond. Conley Canal  start on oxyCODONE-acetaminophen. I am also having her maintain her lidocaine, lidocaine, diazepam, Oxybutynin Chloride, clobetasol ointment, nystatin, amLODipine, DULoxetine, traZODone, Diclofenac Sodium CR, omeprazole, and oxyCODONE-acetaminophen.  Pharmacotherapy (Medications Ordered): Meds ordered this encounter  Medications  . oxyCODONE-acetaminophen (PERCOCET) 10-325 MG tablet    Sig: Take 1 tablet by mouth every 6 (six) hours as needed for pain. Must last 30 days.    Dispense:  120 tablet    Refill:  0    [place] STOP ACT - Not applicable. Fill one day early if pharmacy is closed on scheduled refill date.  Marland Kitchen oxyCODONE-acetaminophen (PERCOCET) 10-325 MG tablet    Sig: Take 1 tablet by mouth every 6 (six) hours as needed for pain. Must last 30 days.    Dispense:  120 tablet    Refill:  0    [place] STOP ACT - Not applicable. Fill one day early if pharmacy is closed on scheduled refill date.    Follow-up plan:   Return in about 11 weeks (around 02/02/2019) for Medication Management.   Recent Visits Date Type Provider Dept  10/07/18 Procedure visit Gillis Santa, MD [facility]  09/28/18 Office Visit Gillis Santa, MD [facility]  Showing recent visits within past 90 days and meeting all other requirements   Today's Visits Date Type Provider Dept  11/17/18 Office Visit Gillis Santa, MD [facility]  Showing today's visits and meeting all other requirements   Future Appointments No visits were found meeting these conditions.  Showing future appointments within next 90 days and meeting all other requirements   I discussed the assessment and treatment plan with the Lauren Greene. The Lauren Greene was provided an opportunity to ask questions and all were answered. The Lauren Greene agreed with the plan and demonstrated an understanding of the instructions.  Lauren Greene advised to call back or seek an in-person evaluation if the symptoms or condition worsens.  Total duration of  non-face-to-face encounter: 25 minutes.  Note by: Gillis Santa, MD Date: 11/17/2018; Time: 12:31 PM  Note: This dictation was prepared with Dragon dictation. Any transcriptional errors that may result from this process are unintentional.  Disclaimer:  * Given the special circumstances of the COVID-19 pandemic, the federal government has announced that the Office for Civil Rights (OCR) will exercise its enforcement discretion and will not impose penalties on physicians using telehealth in the event of noncompliance with regulatory requirements under the  Health Insurance Portability and Accountability Act (HIPAA) in connection with the good faith provision of telehealth during the NWGNF-62 national public health emergency. (AMA)

## 2018-11-26 IMAGING — DX DG WRIST COMPLETE 3+V*L*
4 series · 4 of 4 positions shown · non-contrast
Comparison: 01/30/2009 left thumb MR.

CLINICAL DATA: 52-year-old female. No injury. Increasing left wrist
pain around distal ulna. Initial encounter.

EXAM:
LEFT WRIST - COMPLETE 3+ VIEW

[wrist pa]
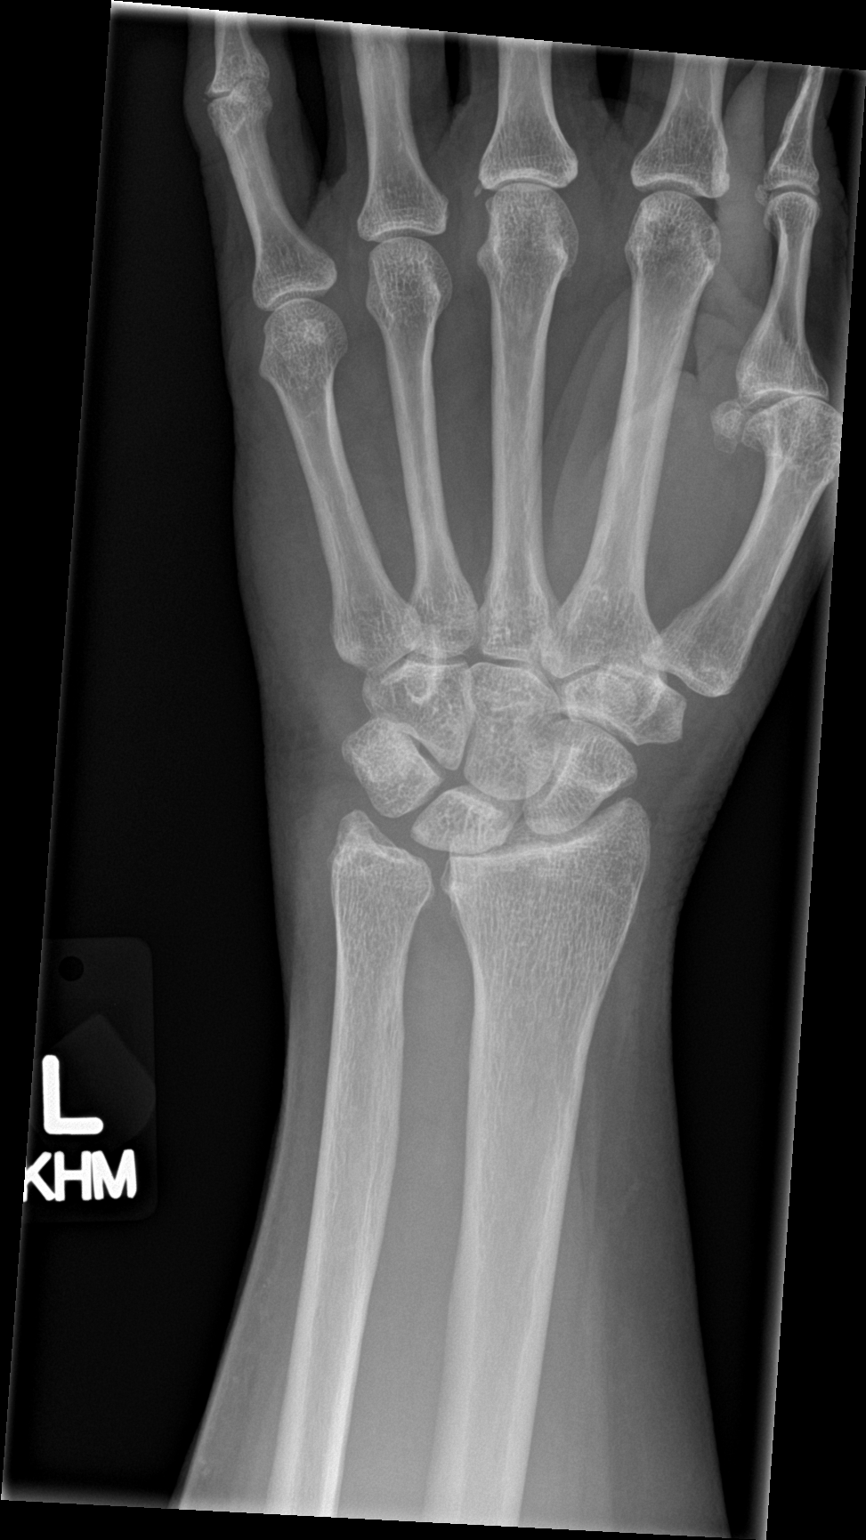

[wrist obl]
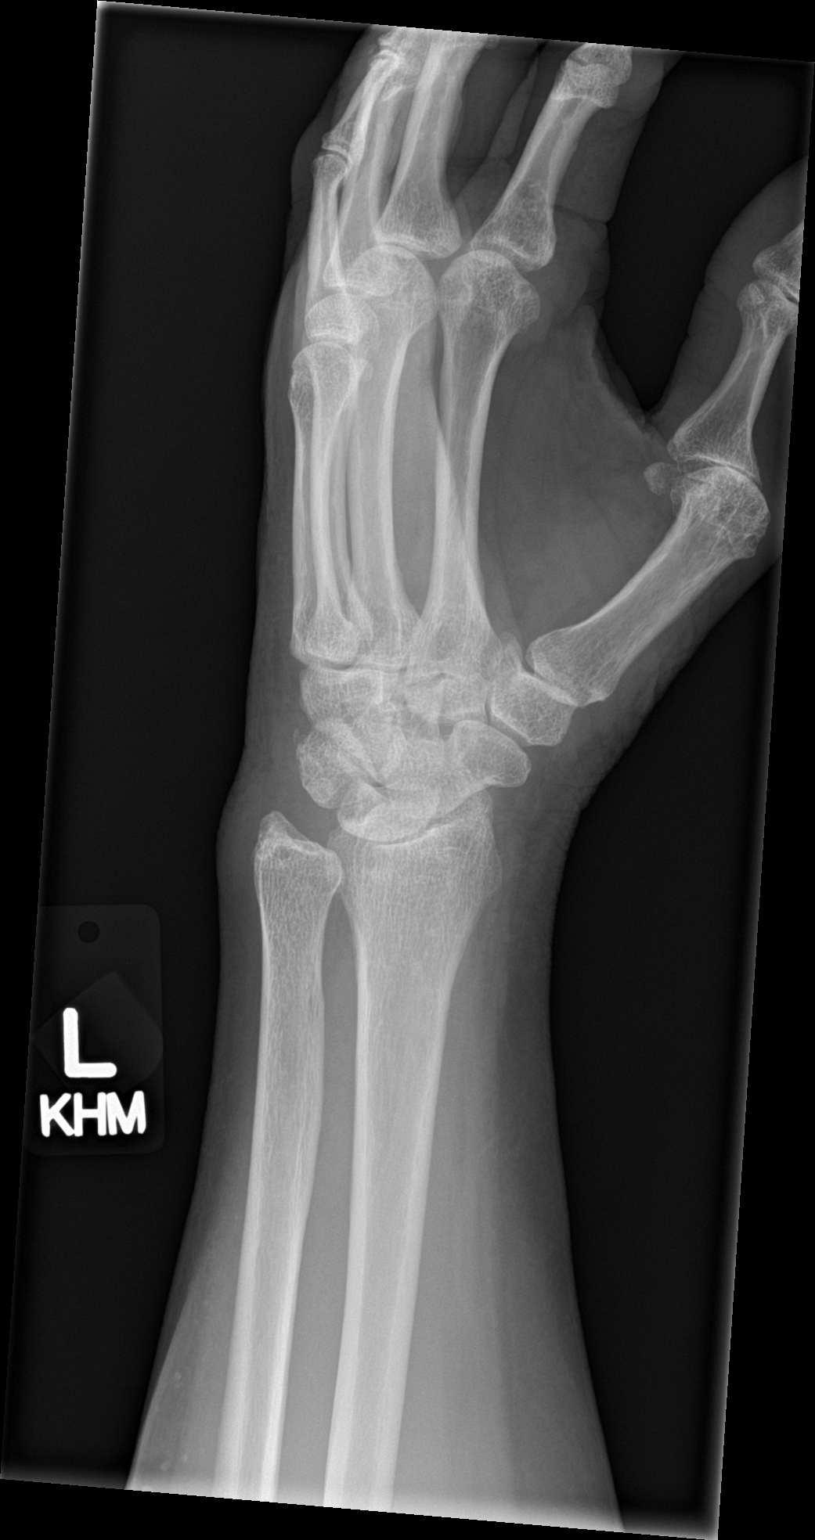

[wrist lat]
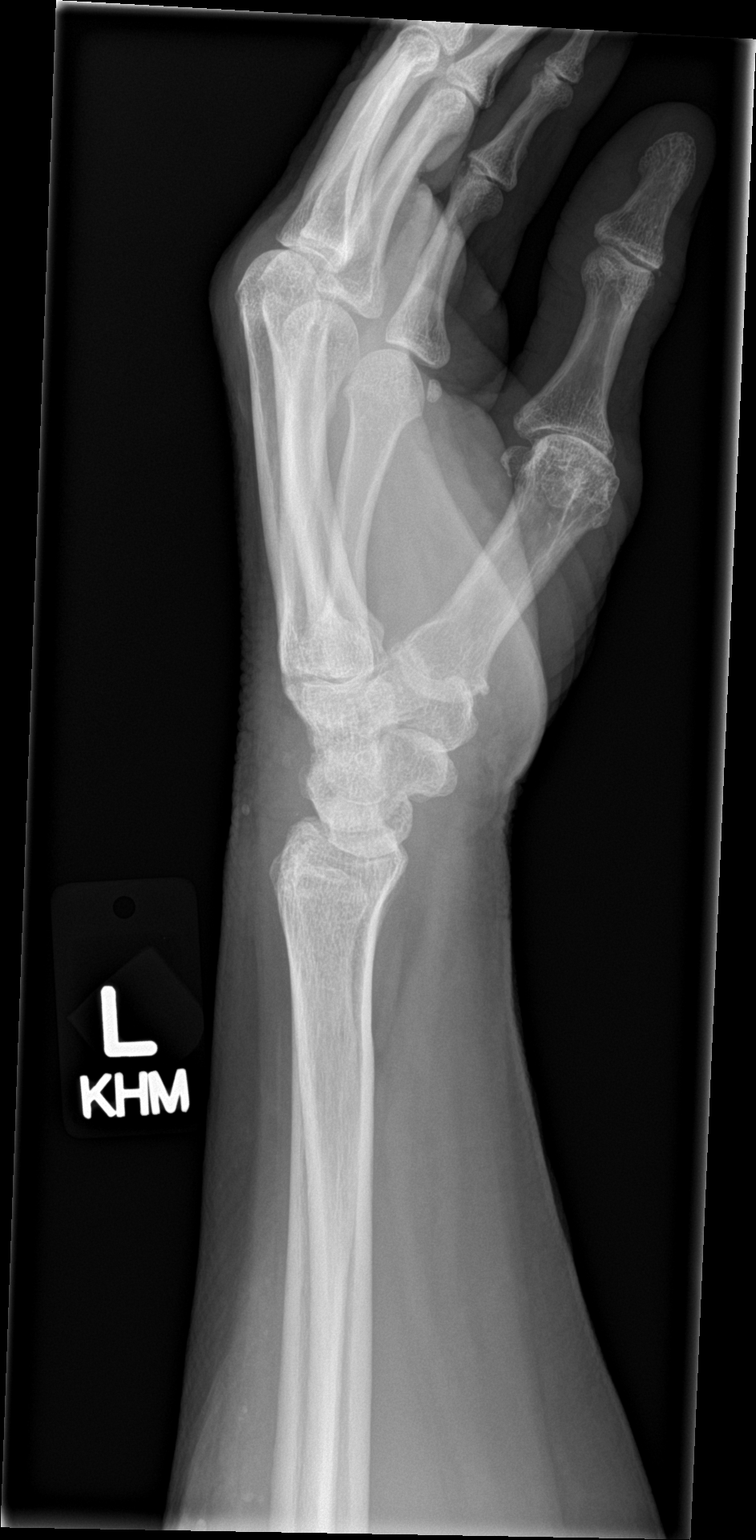

[wrist navicular]
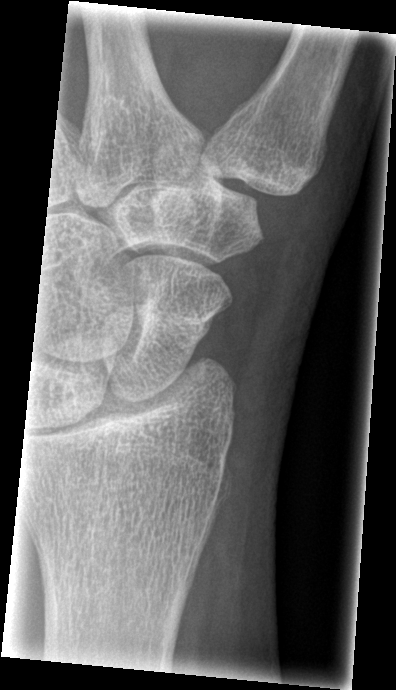

[4 of 4 positions shown; findings below may reference images not displayed]

FINDINGS: Mild degenerative changes distal radial lunate articulation. Slight
increase size of interspace between the lunate and scaphoid may
indicate ligamentous laxity and/or result of remote ligamentous
injury. Minimal spur triquetrum. Moderate left first metacarpal
phalangeal joint degenerative changes. No discrete osseous
abnormality of the distal ulnar. No fracture or dislocation.
IMPRESSION: 1. Mild degenerative changes distal radial lunate articulation.
2. Slight increase size of interspace between the lunate and
scaphoid may indicate ligamentous laxity and/or result of remote
ligamentous injury.
3. Minimal spur triquetrum.
4. Moderate left first metacarpal phalangeal joint degenerative
changes.
5. No discrete osseous abnormality of the distal ulnar.

## 2018-12-08 ENCOUNTER — Other Ambulatory Visit: Payer: Self-pay | Admitting: Family Medicine

## 2018-12-08 ENCOUNTER — Telehealth (HOSPITAL_COMMUNITY): Payer: Self-pay | Admitting: Rehabilitation

## 2018-12-08 ENCOUNTER — Other Ambulatory Visit: Payer: Self-pay

## 2018-12-08 DIAGNOSIS — L03116 Cellulitis of left lower limb: Secondary | ICD-10-CM

## 2018-12-08 NOTE — Telephone Encounter (Signed)

## 2018-12-09 ENCOUNTER — Other Ambulatory Visit: Payer: Self-pay

## 2018-12-09 ENCOUNTER — Ambulatory Visit (INDEPENDENT_AMBULATORY_CARE_PROVIDER_SITE_OTHER): Payer: PPO | Admitting: Vascular Surgery

## 2018-12-09 ENCOUNTER — Ambulatory Visit (HOSPITAL_COMMUNITY)
Admission: RE | Admit: 2018-12-09 | Discharge: 2018-12-09 | Disposition: A | Discharge status: Home / Self Care | Payer: PPO | Source: Ambulatory Visit | Attending: Vascular Surgery | Admitting: Vascular Surgery

## 2018-12-09 ENCOUNTER — Encounter: Payer: Self-pay | Admitting: Vascular Surgery

## 2018-12-09 VITALS — BP 146/92 | HR 72 | Temp 98.3°F | Resp 14 | Ht 68.0 in | Wt 245.0 lb

## 2018-12-09 DIAGNOSIS — L03116 Cellulitis of left lower limb: Secondary | ICD-10-CM | POA: Insufficient documentation

## 2018-12-09 DIAGNOSIS — I739 Peripheral vascular disease, unspecified: Secondary | ICD-10-CM | POA: Diagnosis not present

## 2018-12-09 NOTE — Progress Notes (Signed)
REASON FOR CONSULT:    Cellulitis of left foot.  The consult is requested by Dr. 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  However she has multiphasic signals in the left foot with an ABI of 88%.  On the left side she has normal flow with triphasic signals and an ABI of 100%.  She has had some issues with wounds on her toes related to her Chilblains disease.  I would only consider arteriography if she developed a significant wound which was not showing signs of healing.  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  Deitra Mayo, MD, FACS Beeper [phone] Office: [phone]   HPI:   Lauren Greene is a pleasant 53 y.o. female, who was referred for evaluation of peripheral vascular disease.  She has a history of Raynaud's Syndrome and also Chilblains disease.  She intermittently developed some wounds on her toes and had some cellulitis on the left foot earlier this year which is now resolved.  She was sent for evaluation for peripheral vascular disease.  I do not get any symptoms consistent with claudication or rest pain.  Risk factors for peripheral vascular disease include hypertension.  She denies any history of diabetes, hypercholesterolemia, family history of premature cardiovascular disease, or tobacco use.  Past Medical History:  Diagnosis Date  . ADHD (attention deficit hyperactivity disorder)   . Arthritis   . Back pain   . Broken femur ([place]) 08/27/12  . Migraine   . Osteoarthritis   . Raynaud disease   . Raynaud phenomenon 04/06/2018   [facility] diagnosed 2020    Family History  Problem Relation Age of Onset  . Cancer Mother   . Breast cancer Mother   . Bipolar disorder Mother    . Heart failure Father   . Cancer Father   . Cancer Other   . Heart failure Other   . Bipolar disorder Sister     SOCIAL HISTORY: Social History   Socioeconomic History  . Marital status: Single    Spouse name: Not on file  . Number of children: Not on file  . Years of education: Not on file  . Highest education level: Not on file  Occupational History  . Not on file  Social Needs  . Financial resource strain: Not on file  . Food insecurity    Worry: Not on file    Inability: Not on file  . Transportation needs    Medical: Not on file    Non-medical: Not on file  Tobacco Use  . Smoking status: Former Smoker    Years: 10.00    Types: Cigarettes    Quit date: 08/16/2000    Years since quitting: 18.3  . Smokeless tobacco: Never Used  Substance and Sexual Activity  . Alcohol use: Yes    Alcohol/week: 0.0 standard drinks    Comment: rarely  . Drug use: No  . Sexual activity: Not Currently    Partners: Female  Lifestyle  . Physical activity    Days per week: Not on file    Minutes per session: Not on file  . Stress: Not on file  Relationships  . Social Herbalist on phone: Not on file    Gets together: Not on  file    Attends religious service: Not on file    Active member of club or organization: Not on file    Attends meetings of clubs or organizations: Not on file    Relationship status: Not on file  . Intimate partner violence    Fear of current or ex partner: Not on file    Emotionally abused: Not on file    Physically abused: Not on file    Forced sexual activity: Not on file  Other Topics Concern  . Not on file  Social History Narrative  . Not on file    Allergies  Allergen Reactions  . Wellbutrin [Bupropion]     Anger issues  . Lyrica [Pregabalin] Swelling    Leg swelling    Current Outpatient Medications  Medication Sig Dispense Refill  . amLODipine (NORVASC) 2.5 MG tablet TAKE 1 TABLET BY MOUTH ONCE A DAY. 30 tablet 0  .  amphetamine-dextroamphetamine (ADDERALL XR) 30 MG 24 hr capsule 2 q am 60 capsule 0  . amphetamine-dextroamphetamine (ADDERALL XR) 30 MG 24 hr capsule 2 qam 60 capsule 0  . amphetamine-dextroamphetamine (ADDERALL XR) 30 MG 24 hr capsule 2 qam 60 capsule 0  . diazepam (VALIUM) 2 MG tablet Take 1 tablet (2 mg total) by mouth daily as needed for anxiety. 30 tablet 0  . Diclofenac Sodium CR 100 MG 24 hr tablet TAKE 1 TABLET BY MOUTH TWICE DAILY. TAKE WITH FOOD. 180 tablet 1  . DULoxetine (CYMBALTA) 60 MG capsule Take 2 capsules (120 mg total) by mouth daily. 180 capsule 0  . lidocaine (XYLOCAINE) 2 % jelly Apply 1 application topically 2 (two) times daily. 4 mL 1  . lidocaine (XYLOCAINE) 2 % solution TAKE 5 TO 10 MLS BY MOUTH TWICE DAILY AS NEEDED. 100 mL 0  . nystatin (MYCOSTATIN/NYSTOP) powder APPLY TO AFFECTED AREA(S) TOPICALLY TWICE DAILY. 60 g 0  . omeprazole (PRILOSEC) 40 MG capsule TAKE (1) CAPSULE BY MOUTH ONCE DAILY. 30 capsule 0  . Oxybutynin Chloride 10 % GEL Apply daily to skin for overactive bladder 1 g 6  . [START ON 12/17/2018] oxyCODONE-acetaminophen (PERCOCET) 10-325 MG tablet Take 1 tablet by mouth every 6 (six) hours as needed for pain. Must last 30 days. 120 tablet 0  . [START ON 01/16/2019] oxyCODONE-acetaminophen (PERCOCET) 10-325 MG tablet Take 1 tablet by mouth every 6 (six) hours as needed for pain. Must last 30 days. 120 tablet 0  . traZODone (DESYREL) 100 MG tablet 100-200 mg at night as needed for sleep 180 tablet 0  . clobetasol ointment (TEMOVATE) 0.05 % Apply 1 application topically as needed. (Patient not taking: Reported on 12/09/2018) 30 g 0   No current facility-administered medications for this visit.     REVIEW OF SYSTEMS:  [X]  denotes positive finding, [ ]  denotes negative finding Cardiac  Comments:  Chest pain or chest pressure:    Shortness of breath upon exertion:    Short of breath when lying flat:    Irregular heart rhythm:        Vascular    Pain in  calf, thigh, or hip brought on by ambulation:    Pain in feet at night that wakes you up from your sleep:     Blood clot in your veins:    Leg swelling:  x  Right foot only.      Pulmonary    Oxygen at home:    Productive cough:     Wheezing:  Neurologic    Sudden weakness in arms or legs:     Sudden numbness in arms or legs:     Sudden onset of difficulty speaking or slurred speech:    Temporary loss of vision in one eye:     Problems with dizziness:         Gastrointestinal    Blood in stool:     Vomited blood:         Genitourinary    Burning when urinating:     Blood in urine:        Psychiatric    Major depression:         Hematologic    Bleeding problems:    Problems with blood clotting too easily:        Skin    Rashes or ulcers:        Constitutional    Fever or chills:     PHYSICAL EXAM:   Vitals:   12/09/18 1233  BP: (!) 146/92  Pulse: 72  Resp: 14  Temp: 98.3 F (36.8 C)  TempSrc: Temporal  SpO2: 99%  Weight: 245 lb (111.1 kg)  Height: 5\' 8"  (1.727 m)   Body mass index is 37.25 kg/m.  GENERAL: The patient is a well-nourished female, in no acute distress. The vital signs are documented above. CARDIAC: There is a regular rate and rhythm.  VASCULAR: I do not detect carotid bruits. It is difficult to assess for femoral pulses because of her obesity. She has palpable dorsalis pedis and posterior tibial pulses bilaterally. She currently has no significant lower extremity swelling. PULMONARY: There is good air exchange bilaterally without wheezing or rales. ABDOMEN: Soft and non-tender with normal pitched bowel sounds.  MUSCULOSKELETAL: There are no major deformities or cyanosis. NEUROLOGIC: No focal weakness or paresthesias are detected. SKIN: There are no ulcers or rashes noted. PSYCHIATRIC: The patient has a normal affect.  DATA:    ARTERIAL DOPPLER STUDY: I have independently interpreted the patient's arterial Doppler study today.   On the right side there is a triphasic dorsalis pedis and posterior tibial signal.  ABI is 100%.  On the left side there is a biphasic posterior tibial signal with a triphasic dorsalis pedis signal.  ABI is 88%.

## 2018-12-10 ENCOUNTER — Other Ambulatory Visit: Payer: Self-pay | Admitting: Family Medicine

## 2018-12-21 NOTE — Progress Notes (Signed)
Virtual Visit via Video Note  I connected with Lauren Greene on 12/28/18 at  1:00 PM EDT by a video enabled telemedicine application and verified that I am speaking with the correct person using two identifiers.   I discussed the limitations of evaluation and management by telemedicine and the availability of in person appointments. The patient expressed understanding and agreed to proceed.    I discussed the assessment and treatment plan with the patient. The patient was provided an opportunity to ask questions and all were answered. The patient agreed with the plan and demonstrated an understanding of the instructions.   The patient was advised to call back or seek an in-person evaluation if the symptoms worsen or if the condition fails to improve as anticipated.  I provided 15 minutes of non-face-to-face time during this encounter.   Neysa Hotter, MD    [facility] MD/PA/NP OP Progress Note  12/28/2018 1:21 PM Lauren Greene  MRN:  [id number]  Chief Complaint:  Chief Complaint    Depression; Follow-up     HPI:  This is a follow-up appointment for depression.  She states that she has been doing very well.  She is now taking care of her three months old grandchild.  Her daughter comes and do remote work at her place. She has been keeping herself busy by making crafts.  She tends to cry in the morning or late at night due to ongoing pain. She feels anxious at times, being worried about financial strain. She also feels anxious when she tries to do something new. She denies insomnia. She has fair appetite. She has fair concentration. She denies SI. She denies panic attacks. She reports a brief period of depression in the past when she lost her child. She denies any other significant mood episode. She wonders if she can see PCP only as she wants to have only one provider. She also does not think she needs to be seen by psychiatry.   Visit Diagnosis:    ICD-10-CM   1. Recurrent major  depressive disorder, in full remission (HCC)  F33.42     Past Psychiatric History: Please see initial evaluation for full details. I have reviewed the history. No updates at this time.     Past Medical History:  Past Medical History:  Diagnosis Date  . ADHD (attention deficit hyperactivity disorder)   . Arthritis   . Back pain   . Broken femur (HCC) 08/27/12  . Migraine   . Osteoarthritis   . Raynaud disease   . Raynaud phenomenon 04/06/2018   [facility] podiatry diagnosed 2020    Past Surgical History:  Procedure Laterality Date  . BACK SURGERY    . CERVICAL BIOPSY  W/ LOOP ELECTRODE EXCISION  1990's   in [place], [place]  . ENDOMETRIAL BIOPSY  04-05-05 and 02-12-06   04-05-05 benign but too rapid growth. RXd Provera and repeat Bx 78mo. 02-12-06 Bx benign--Dr. Leda Quail  . FEMUR IM NAIL Left 08/27/2012   Procedure: INTRAMEDULLARY (IM) RETROGRADE FEMORAL NAILING;  Surgeon: Kathryne Hitch, MD;  Location: [facility] OR;  Service: Orthopedics;  Laterality: Left;  . HEEL SPUR SURGERY    . IM NAILING FEMORAL SHAFT FRACTURE Left 08/27/2012   Dr Magnus Ivan  . SHOULDER SURGERY Left   . TOTAL KNEE ARTHROPLASTY     x2  . TUBAL LIGATION    . VAGINAL HYSTERECTOMY      Family Psychiatric History: Please see initial evaluation for full details. I have reviewed the history.  No updates at this time.     Family History:  Family History  Problem Relation Age of Onset  . Cancer Mother   . Breast cancer Mother   . Bipolar disorder Mother   . Heart failure Father   . Cancer Father   . Cancer Other   . Heart failure Other   . Bipolar disorder Sister     Social History:  Social History   Socioeconomic History  . Marital status: Single    Spouse name: Not on file  . Number of children: Not on file  . Years of education: Not on file  . Highest education level: Not on file  Occupational History  . Not on file  Social Needs  . Financial resource strain: Not on file  . Food insecurity     Worry: Not on file    Inability: Not on file  . Transportation needs    Medical: Not on file    Non-medical: Not on file  Tobacco Use  . Smoking status: Former Smoker    Years: 10.00    Types: Cigarettes    Quit date: 08/16/2000    Years since quitting: 18.3  . Smokeless tobacco: Never Used  Substance and Sexual Activity  . Alcohol use: Yes    Alcohol/week: 0.0 standard drinks    Comment: rarely  . Drug use: No  . Sexual activity: Not Currently    Partners: Female  Lifestyle  . Physical activity    Days per week: Not on file    Minutes per session: Not on file  . Stress: Not on file  Relationships  . Social Herbalist on phone: Not on file    Gets together: Not on file    Attends religious service: Not on file    Active member of club or organization: Not on file    Attends meetings of clubs or organizations: Not on file    Relationship status: Not on file  Other Topics Concern  . Not on file  Social History Narrative  . Not on file    Allergies:  Allergies  Allergen Reactions  . Wellbutrin [Bupropion]     Anger issues  . Lyrica [Pregabalin] Swelling    Leg swelling    Metabolic Disorder Labs: Lab Results  Component Value Date   HGBA1C 5.6 07/22/2014   MPG 103 02/16/2013   MPG 111 07/24/2010   No results found for: PROLACTIN Lab Results  Component Value Date   CHOL 185 01/09/2018   TRIG 102 01/09/2018   HDL 50 01/09/2018   CHOLHDL 3.7 01/09/2018   VLDL 13 07/10/2012   LDLCALC 115 (H) 01/09/2018   LDLCALC 116 (H) 03/15/2017   Lab Results  Component Value Date   TSH 4.104 06/24/2013   TSH 4.142 01/07/2013    Therapeutic Level Labs: No results found for: LITHIUM No results found for: VALPROATE No components found for:  CBMZ  Current Medications: Current Outpatient Medications  Medication Sig Dispense Refill  . amLODipine (NORVASC) 2.5 MG tablet TAKE 1 TABLET BY MOUTH ONCE A DAY. 30 tablet 0  . amphetamine-dextroamphetamine  (ADDERALL XR) 30 MG 24 hr capsule 2 q am 60 capsule 0  . amphetamine-dextroamphetamine (ADDERALL XR) 30 MG 24 hr capsule 2 qam 60 capsule 0  . amphetamine-dextroamphetamine (ADDERALL XR) 30 MG 24 hr capsule 2 qam 60 capsule 0  . clobetasol ointment (TEMOVATE) 7.20 % Apply 1 application topically as needed. (Patient not taking: Reported on 12/09/2018) 30  g 0  . diazepam (VALIUM) 2 MG tablet Take 1 tablet (2 mg total) by mouth daily as needed for anxiety. 30 tablet 0  . Diclofenac Sodium CR 100 MG 24 hr tablet TAKE 1 TABLET BY MOUTH TWICE DAILY. TAKE WITH FOOD. 180 tablet 1  . [START ON 02/03/2019] DULoxetine (CYMBALTA) 60 MG capsule Take 2 capsules (120 mg total) by mouth daily. 180 capsule 0  . lidocaine (XYLOCAINE) 2 % jelly Apply 1 application topically 2 (two) times daily. 4 mL 1  . lidocaine (XYLOCAINE) 2 % solution TAKE 5 TO 10 MLS BY MOUTH TWICE DAILY AS NEEDED. 100 mL 0  . nystatin (MYCOSTATIN/NYSTOP) powder APPLY TO AFFECTED AREA(S) TOPICALLY TWICE DAILY. 60 g 0  . omeprazole (PRILOSEC) 40 MG capsule TAKE (1) CAPSULE BY MOUTH ONCE DAILY. 30 capsule 0  . Oxybutynin Chloride 10 % GEL Apply daily to skin for overactive bladder 1 g 6  . oxyCODONE-acetaminophen (PERCOCET) 10-325 MG tablet Take 1 tablet by mouth every 6 (six) hours as needed for pain. Must last 30 days. 120 tablet 0  . [START ON 01/16/2019] oxyCODONE-acetaminophen (PERCOCET) 10-325 MG tablet Take 1 tablet by mouth every 6 (six) hours as needed for pain. Must last 30 days. 120 tablet 0  . [START ON 02/03/2019] traZODone (DESYREL) 100 MG tablet 100-200 mg at night as needed for sleep 180 tablet 0   No current facility-administered medications for this visit.      Musculoskeletal: Strength & Muscle Tone: N/A Gait & Station: N/ A Patient leans: N/A  Psychiatric Specialty Exam: Review of Systems  Psychiatric/Behavioral: Negative for depression, hallucinations, memory loss, substance abuse and suicidal ideas. The patient is  nervous/anxious. The patient does not have insomnia.   All other systems reviewed and are negative.   Last menstrual period 03/18/2004.There is no height or weight on file to calculate BMI.  General Appearance: Fairly Groomed  Eye Contact:  Good  Speech:  Clear and Coherent  Volume:  Normal  Mood:  "fine"  Affect:  Appropriate, Congruent and calm  Thought Process:  Coherent  Orientation:  Full (Time, Place, and Person)  Thought Content: Logical   Suicidal Thoughts:  No  Homicidal Thoughts:  No  Memory:  Immediate;   Good  Judgement:  Good  Insight:  Fair  Psychomotor Activity:  Normal  Concentration:  Concentration: Good and Attention Span: Good  Recall:  Good  Fund of Knowledge: Good  Language: Good  Akathisia:  No  Handed:  Right  AIMS (if indicated): not done  Assets:  Communication Skills Desire for Improvement  ADL's:  Intact  Cognition: WNL  Sleep:  Fair   Screenings: PHQ2-9     Procedure visit from 10/07/2018 in [facility] Office Visit from 04/28/2018 in [facility] Clinical Support from 02/26/2018 in [facility] Procedure visit from 12[facility]01/2017 in [facility] Clinical Support from 10/22/2017 in [facility]  PHQ-2 Total Score  0  0  0  0  0       Assessment and Plan:  Lauren Greene is a 53 y.o. year old female with a history of depression, ADHD by history, anxiety, hyperhydrosis, chronic pain , who presents for follow up appointment for Recurrent major depressive disorder, in full remission (HCC)  # MDD, recurrent in full remission She denies significant mood symptoms except occasional anxiety since the  last visit.  After discussion of option of her treatment (whether to taper down duloxetine), she prefers to stay on current dose of duloxetine.  Point  continue current dose of duloxetine to target depression. Discussed risk of hypertension.  Will continue trazodone as needed for insomnia.  Would continue Valium as needed for anxiety (she still has some left from the order on March for 30 days).  Discussed risk of dependence and oversedation.  Discussed behavioral activation.  Noted that given she prefers to be seen only by one provider, she agrees to discuss with her PCP whether he feels comfortable to continue her medication. She is advised to contact our office if she decided to continue care here (advised to make appointment in 3 months if that is the plan.   # ADHD She was diagnosed with ADHD by PCP.  She is on Adderall, prescribed by her PCP.   Plan I have reviewed and updated plans as below 1 Continue duloxetine 120 mg daily  2. Continue Trazodone 100 - 200 mg at night as needed for sleep 3.Continue valium 2 mg daily as needed for anxiety (she declines refill at this time) 4.Return to clinic as needed (likely will be followed by PCP) (Patient is on Adderall XR60 mg daily prescribed by her PCP)  The patient demonstrates the following risk factors for suicide: Chronic risk factors for suicide include: psychiatric disorder of depressionand chronic pain. Acute risk factorsfor suicide include: loss (financial, interpersonal, professional). Protective factorsfor this patient include: responsibility to others (children, family), coping skills and hope for the future. Considering these factors, the overall suicide risk at this point appears to be low. Patient isappropriate for outpatient follow up.  [name] Ardena Gangl, MD 12/28/2018, 1:21 PM

## 2018-12-28 ENCOUNTER — Encounter (HOSPITAL_COMMUNITY): Payer: Self-pay | Admitting: Psychiatry

## 2018-12-28 ENCOUNTER — Other Ambulatory Visit: Payer: Self-pay

## 2018-12-28 ENCOUNTER — Ambulatory Visit (INDEPENDENT_AMBULATORY_CARE_PROVIDER_SITE_OTHER): Payer: PPO | Admitting: Psychiatry

## 2018-12-28 DIAGNOSIS — F3342 Major depressive disorder, recurrent, in full remission: Secondary | ICD-10-CM

## 2018-12-28 MED ORDER — TRAZODONE HCL 100 MG PO TABS: ORAL_TABLET | ORAL | 0 refills | Status: DC

## 2018-12-28 MED ORDER — DULOXETINE HCL 60 MG PO CPEP: 120.0000 mg | ORAL_CAPSULE | Freq: Every day | ORAL | 0 refills | Status: DC

## 2018-12-28 MED ORDER — DIAZEPAM 2 MG PO TABS: 2.0000 mg | ORAL_TABLET | Freq: Every day | ORAL | 0 refills | Status: DC | PRN

## 2018-12-31 ENCOUNTER — Ambulatory Visit (HOSPITAL_COMMUNITY)
Admission: RE | Admit: 2018-12-31 | Discharge: 2018-12-31 | Disposition: A | Discharge status: Home / Self Care | Payer: PPO | Source: Ambulatory Visit | Attending: Diagnostic Radiology | Admitting: Diagnostic Radiology

## 2018-12-31 ENCOUNTER — Other Ambulatory Visit: Payer: Self-pay

## 2018-12-31 ENCOUNTER — Other Ambulatory Visit (HOSPITAL_COMMUNITY): Payer: Self-pay | Admitting: Family Medicine

## 2018-12-31 DIAGNOSIS — Z1231 Encounter for screening mammogram for malignant neoplasm of breast: Secondary | ICD-10-CM

## 2019-01-11 ENCOUNTER — Other Ambulatory Visit: Payer: Self-pay | Admitting: Family Medicine

## 2019-02-01 ENCOUNTER — Encounter: Payer: Self-pay | Admitting: Student in an Organized Health Care Education/Training Program

## 2019-02-01 NOTE — Progress Notes (Signed)
Patient has been having difficulty getting Rx's filled d/t supply at her pharmacy.  She has had to begin filling on the first of every month and she states that her dates may seem a little eratic.

## 2019-02-02 ENCOUNTER — Ambulatory Visit
Payer: PPO | Attending: Student in an Organized Health Care Education/Training Program | Admitting: Student in an Organized Health Care Education/Training Program

## 2019-02-02 ENCOUNTER — Encounter: Payer: Self-pay | Admitting: Student in an Organized Health Care Education/Training Program

## 2019-02-02 ENCOUNTER — Other Ambulatory Visit: Payer: Self-pay

## 2019-02-02 DIAGNOSIS — Z79891 Long term (current) use of opiate analgesic: Secondary | ICD-10-CM

## 2019-02-02 DIAGNOSIS — M5136 Other intervertebral disc degeneration, lumbar region: Secondary | ICD-10-CM | POA: Diagnosis not present

## 2019-02-02 DIAGNOSIS — G894 Chronic pain syndrome: Secondary | ICD-10-CM

## 2019-02-02 DIAGNOSIS — M797 Fibromyalgia: Secondary | ICD-10-CM | POA: Diagnosis not present

## 2019-02-02 DIAGNOSIS — M47816 Spondylosis without myelopathy or radiculopathy, lumbar region: Secondary | ICD-10-CM | POA: Diagnosis not present

## 2019-02-02 DIAGNOSIS — M25561 Pain in right knee: Secondary | ICD-10-CM | POA: Diagnosis not present

## 2019-02-02 DIAGNOSIS — M5440 Lumbago with sciatica, unspecified side: Secondary | ICD-10-CM

## 2019-02-02 DIAGNOSIS — G8929 Other chronic pain: Secondary | ICD-10-CM

## 2019-02-02 DIAGNOSIS — M25552 Pain in left hip: Secondary | ICD-10-CM | POA: Diagnosis not present

## 2019-02-02 DIAGNOSIS — M25551 Pain in right hip: Secondary | ICD-10-CM | POA: Diagnosis not present

## 2019-02-02 DIAGNOSIS — M25562 Pain in left knee: Secondary | ICD-10-CM

## 2019-02-02 MED ORDER — OXYCODONE-ACETAMINOPHEN 10-325 MG PO TABS: 1.0000 | ORAL_TABLET | Freq: Four times a day (QID) | ORAL | 0 refills | Status: DC | PRN

## 2019-02-02 MED ORDER — NONFORMULARY OR COMPOUNDED ITEM: 2 refills | Status: DC

## 2019-02-02 NOTE — Progress Notes (Signed)
Pain Management Virtual Encounter Note - Virtual Visit via Video Conference Telehealth (real-time audio visits between healthcare provider and patient).   Patient's Phone No. & Preferred Pharmacy:  [phone] (home); [phone] (mobile); (Preferred) [phone] [email or web address]  [facility] - [place], [place] - [address] [place] [address] Phone: [phone] Fax: [phone]    Pre-screening note:  Our staff contacted Lauren Greene and offered her an "in person", "face-to-face" appointment versus a telephone encounter. She indicated preferring the telephone encounter, at this time.   Reason for Virtual Visit: COVID-19*  Social distancing based on CDC and AMA recommendations.   I contacted Melburn Hake on 02/02/2019 via video conference.      I clearly identified myself as Edward Jolly, MD. I verified that I was speaking with the correct person using two identifiers (Name: Lauren Greene, and date of birth: Feb 15, 1966).  Advanced Informed Consent I sought verbal advanced consent from Melburn Hake for virtual visit interactions. I informed Ms. Luby of possible security and privacy concerns, risks, and limitations associated with providing "not-in-person" medical evaluation and management services. I also informed Ms. Pence of the availability of "in-person" appointments. Finally, I informed her that there would be a charge for the virtual visit and that she could be  personally, fully or partially, financially responsible for it. Ms. Donlan expressed understanding and agreed to proceed.   Historic Elements   [name] is a 53 y.o. year old, female patient evaluated today after her last encounter by our practice on 11/17/2018. Lauren Greene  has a past medical history of ADHD (attention deficit hyperactivity disorder), Arthritis, Back pain, Broken femur (HCC) (08/27/12), Migraine, Osteoarthritis, Raynaud disease, and Raynaud  phenomenon (04/06/2018). She also  has a past surgical history that includes Total knee arthroplasty; Heel spur surgery; Back surgery; IM nailing femoral shaft fracture (Left, 08/27/2012); Femur IM nail (Left, 08/27/2012); Cervical biopsy w/ loop electrode excision (1990's); Endometrial biopsy (04-05-05 and 02-12-06); Tubal ligation; Shoulder surgery (Left); and Vaginal hysterectomy. Lauren Greene has a current medication list which includes the following prescription(s): amlodipine, amphetamine-dextroamphetamine, amphetamine-dextroamphetamine, amphetamine-dextroamphetamine, diazepam, diclofenac sodium cr, duloxetine, lidocaine, lidocaine, nystatin, omeprazole, oxybutynin chloride, oxycodone-acetaminophen, oxycodone-acetaminophen, oxycodone-acetaminophen, trazodone, and NONFORMULARY OR COMPOUNDED ITEM. She  reports that she quit smoking about 18 years ago. Her smoking use included cigarettes. She quit after 10.00 years of use. She has never used smokeless tobacco. She reports current alcohol use. She reports that she does not use drugs. Lauren Greene is allergic to wellbutrin [bupropion] and lyrica [pregabalin].   HPI  Today, she is being contacted for medication management.   No change in medical history since last visit.  Patient's pain is at baseline.  Patient continues multimodal pain regimen as prescribed.  States that it provides pain relief and improvement in functional status. Patient states that she may be interested in utilizing him.  We discussed risks and benefits and how this could impact her pain contract. She has been more active in caring for her granddaughter. We discussed the addition of compounded cream as below.  Pharmacotherapy Assessment  Analgesic:  01/25/2019  1   11/17/2018  Oxycodone-Acetaminophen 10-325  120.00  30 Bi Lat   [id number]   Car (9744)   0  60.00 MME  Comm Ins   [place]    Monitoring: Pharmacotherapy: No side-effects or adverse reactions reported. [place] PMP: PDMP reviewed  during this encounter.       Compliance: No problems identified. Effectiveness: Clinically acceptable. Plan:  Refer to "POC".  UDS:  Summary  Date Value Ref Range Status  06/04/2018 FINAL  Final    Comment:    ==================================================================== TOXASSURE SELECT 13 (MW) ==================================================================== Test                             Result       Flag       Units Drug Present and Declared for Prescription Verification   Amphetamine                    >3106        EXPECTED   ng/mg creat    Amphetamine is available as a schedule II prescription drug.   Oxycodone                      >3106        EXPECTED   ng/mg creat   Oxymorphone                    2325         EXPECTED   ng/mg creat   Noroxycodone                   >3106        EXPECTED   ng/mg creat   Noroxymorphone                 378          EXPECTED   ng/mg creat    Sources of oxycodone are scheduled prescription medications.    Oxymorphone, noroxycodone, and noroxymorphone are expected    metabolites of oxycodone. Oxymorphone is also available as a    scheduled prescription medication. Drug Absent but Declared for Prescription Verification   Diazepam                       Not Detected UNEXPECTED ng/mg creat ==================================================================== Test                      Result    Flag   Units      Ref Range   Creatinine              322              mg/dL      >=20 ==================================================================== Declared Medications:  The flagging and interpretation on this report are based on the  following declared medications.  Unexpected results may arise from  inaccuracies in the declared medications.  **Note: The testing scope of this panel includes these medications:  Amphetamine (Amphetamine-Dextroamphetamine)  Diazepam  Oxycodone (Percocet)  **Note: The testing scope of this panel does not  include following  reported medications:  Acetaminophen (Percocet)  Amlodipine  Clobetasol  Diclofenac  Duloxetine  Eye Drop (Sulfacetamide)  Lidocaine  Methocarbamol  Nystatin  Omeprazole  Oxybutynin  Sulfamethoxazole (Bactrim)  Trazodone (Desyrel)  Trimethoprim (Bactrim) ==================================================================== For clinical consultation, please call [phone]. ====================================================================    Laboratory Chemistry Profile (12 mo)  Renal: No results found for requested labs within last 8760 hours.  Lab Results  Component Value Date   GFRAA 114 01/09/2018   GFRNONAA 99 01/09/2018   Hepatic: No results found for requested labs within last 8760 hours. Lab Results  Component Value Date   AST 26 03/15/2017   ALT 26 03/15/2017   Other: 04/06/2018: Sed Rate  4 Note: Above Lab results reviewed.  Imaging  Last 90 days:  Mm 3d Screen Breast Bilateral  Result Date: 01/01/2019 CLINICAL DATA:  Screening. EXAM: DIGITAL SCREENING BILATERAL MAMMOGRAM WITH TOMO AND CAD COMPARISON:  Previous exam(s). ACR Breast Density Category b: There are scattered areas of fibroglandular density. FINDINGS: There are no findings suspicious for malignancy. Images were processed with CAD. IMPRESSION: No mammographic evidence of malignancy. A result letter of this screening mammogram will be mailed directly to the patient. RECOMMENDATION: Screening mammogram in one year. (Code:SM-B-01Y) BI-RADS CATEGORY  1: Negative. Electronically Signed   By: Annia Belt M.D.   On: 01/01/2019 12:50   Vas [place] Vanice Sarah With/wo Tbi  Result Date: 12/09/2018 LOWER EXTREMITY DOPPLER STUDY Indications: Raynauds with persistent toe infections. High Risk Factors: Hyperlipidemia, past history of smoking.  Performing Technologist: Dorthula Matas RVS, RCS  Examination Guidelines: A complete evaluation includes at minimum, Doppler waveform signals and systolic blood  pressure reading at the level of bilateral brachial, anterior tibial, and posterior tibial arteries, when vessel segments are accessible. Bilateral testing is considered an integral part of a complete examination. Photoelectric Plethysmograph (PPG) waveforms and toe systolic pressure readings are included as required and additional duplex testing as needed. Limited examinations for reoccurring indications may be performed as noted.  ABI Findings: +--------+------------------+-----+---------+--------+ Right   Rt Pressure (mmHg)IndexWaveform Comment  +--------+------------------+-----+---------+--------+ Brachial170                                      +--------+------------------+-----+---------+--------+ PTA     171               1.01 triphasic         +--------+------------------+-----+---------+--------+ DP      168               0.99 triphasic         +--------+------------------+-----+---------+--------+ +--------+------------------+-----+---------+-------+ Left    Lt Pressure (mmHg)IndexWaveform Comment +--------+------------------+-----+---------+-------+ Brachial170                                     +--------+------------------+-----+---------+-------+ PTA     144               0.85 biphasic         +--------+------------------+-----+---------+-------+ DP      150               0.88 triphasic        +--------+------------------+-----+---------+-------+ +-------+-----------+-----------+------------+------------+ ABI/TBIToday's ABIToday's TBIPrevious ABIPrevious TBI +-------+-----------+-----------+------------+------------+ Right  1.01       absent                              +-------+-----------+-----------+------------+------------+ Left   0.88       absent                              +-------+-----------+-----------+------------+------------+  Summary: Right: Resting right ankle-brachial index is within normal range. No evidence of  significant right lower extremity arterial disease. Absent toe waveform. Left: Resting left ankle-brachial index indicates mild left lower extremity arterial disease. Absent toe waveform.  *See table(s) above for measurements and observations.  Electronically signed by Waverly Ferrari MD on 12/09/2018 at 1:01:07 PM.   Final  Assessment  The primary encounter diagnosis was Lumbar degenerative disc disease. Diagnoses of Lumbar spondylosis, Long term current use of opiate analgesic, Long term prescription opiate use, Chronic arthralgias of knees and hips, Fibromyalgia, Chronic bilateral low back pain with sciatica, sciatica laterality unspecified, Lumbar facet arthropathy, and Chronic pain syndrome were also pertinent to this visit.  Plan of Care  I have discontinued Gearldine Shownlaine G. Soy's clobetasol ointment. I am also having her start on oxyCODONE-acetaminophen, oxyCODONE-acetaminophen, and NONFORMULARY OR COMPOUNDED ITEM. Additionally, I am having her maintain her lidocaine, lidocaine, Oxybutynin Chloride, Diclofenac Sodium CR, amphetamine-dextroamphetamine, amphetamine-dextroamphetamine, amphetamine-dextroamphetamine, amLODipine, nystatin, DULoxetine, diazepam, traZODone, omeprazole, and oxyCODONE-acetaminophen.  Pharmacotherapy (Medications Ordered): Meds ordered this encounter  Medications  . oxyCODONE-acetaminophen (PERCOCET) 10-325 MG tablet    Sig: Take 1 tablet by mouth every 6 (six) hours as needed for pain. Must last 30 days.    Dispense:  120 tablet    Refill:  0    [place] STOP ACT - Not applicable. Fill one day early if pharmacy is closed on scheduled refill date.  Marland Kitchen. oxyCODONE-acetaminophen (PERCOCET) 10-325 MG tablet    Sig: Take 1 tablet by mouth every 6 (six) hours as needed for pain. Must last 30 days.    Dispense:  120 tablet    Refill:  0    [place] STOP ACT - Not applicable. Fill one day early if pharmacy is closed on scheduled refill date.  Marland Kitchen. oxyCODONE-acetaminophen (PERCOCET)  10-325 MG tablet    Sig: Take 1 tablet by mouth every 6 (six) hours as needed for pain. Must last 30 days.    Dispense:  120 tablet    Refill:  0     STOP ACT - Not applicable. Fill one day early if pharmacy is closed on scheduled refill date.  . NONFORMULARY OR COMPOUNDED ITEM    Sig: Sig: Apply 1-2 gm(s) (2-4 pumps) to affected area, 3-4 times/day. (1 pump = 0.5 gm)    Dispense:  1 each    Refill:  2    Compounded cream: 6% Gabapentin, 2.5% Lidocaine, 0.5% Meloxicam, 5% Methocarbamol, 2.5% Prilocaine. Dispense: 120 gm Pump Bottle. (Dispenser: 1 pump = 0.5 gm.)   Follow-up plan:   Return in about 3 months (around 05/17/2019) for Medication Management, in person.    Recent Visits Date Type Provider Dept  11/17/18 Office Visit Edward JollyLateef, Phyillis Dascoli, MD [facility]  Showing recent visits within past 90 days and meeting all other requirements   Today's Visits Date Type Provider Dept  02/02/19 Office Visit Edward JollyLateef, Arryanna Holquin, MD [facility]  Showing today's visits and meeting all other requirements   Future Appointments No visits were found meeting these conditions.  Showing future appointments within next 90 days and meeting all other requirements   I discussed the assessment and treatment plan with the patient. The patient was provided an opportunity to ask questions and all were answered. The patient agreed with the plan and demonstrated an understanding of the instructions.  Patient advised to call back or seek an in-person evaluation if the symptoms or condition worsens.  Total duration of non-face-to-face encounter: 25 minutes.  Note by: Edward JollyBilal Hollyanne Schloesser, MD Date: 02/02/2019; Time: 2:17 PM  Note: This dictation was prepared with Dragon dictation. Any transcriptional errors that may result from this process are unintentional.  Disclaimer:  * Given the special circumstances of the COVID-19 pandemic, the federal government has announced that the Office for Civil Rights  (OCR) will exercise its enforcement discretion and will not impose  penalties on physicians using telehealth in the event of noncompliance with regulatory requirements under the [place] and Accountability Act (HIPAA) in connection with the good faith provision of telehealth during the OHFGB-02 national public health emergency. (AMA)

## 2019-02-09 ENCOUNTER — Other Ambulatory Visit: Payer: Self-pay | Admitting: Family Medicine

## 2019-02-09 NOTE — Telephone Encounter (Signed)
May have 4 months worth on each

## 2019-03-10 ENCOUNTER — Other Ambulatory Visit: Payer: Self-pay | Admitting: Family Medicine

## 2019-03-11 ENCOUNTER — Other Ambulatory Visit: Payer: Self-pay | Admitting: Family Medicine

## 2019-03-11 MED ORDER — AMPHETAMINE-DEXTROAMPHET ER 30 MG PO CP24: ORAL_CAPSULE | ORAL | 0 refills | Status: DC

## 2019-03-11 NOTE — Telephone Encounter (Signed)
The refill was sent in

## 2019-03-14 ENCOUNTER — Other Ambulatory Visit: Payer: Self-pay | Admitting: Family Medicine

## 2019-03-16 NOTE — Telephone Encounter (Signed)
Please call apothecary I sent this medication in on December 24 I should be getting ongoing notifications for this medicine unless he did not receive the 24th?  Thank you

## 2019-03-17 NOTE — Telephone Encounter (Signed)
Unable to close message; can you close message? Please advise. Thank you

## 2019-03-17 NOTE — Telephone Encounter (Signed)
Contacted [facility] and states that pt did pick up this med on Mar 11 2019.

## 2019-03-29 ENCOUNTER — Other Ambulatory Visit: Payer: Self-pay | Admitting: Family Medicine

## 2019-03-31 ENCOUNTER — Encounter: Payer: Self-pay | Admitting: Student in an Organized Health Care Education/Training Program

## 2019-03-31 ENCOUNTER — Encounter: Payer: Self-pay | Admitting: Family Medicine

## 2019-04-02 NOTE — Telephone Encounter (Signed)
Front Patient is due for a follow-up visit Please go ahead and set up a virtual follow-up visit within the next 30 days thank you

## 2019-04-05 MED ORDER — NONFORMULARY OR COMPOUNDED ITEM: 5 refills | Status: AC

## 2019-04-05 NOTE — Telephone Encounter (Signed)
Requested Prescriptions   Signed Prescriptions Disp Refills  . NONFORMULARY OR COMPOUNDED ITEM 1 each 5    Sig: Sig: Apply 1-2 gm(s) (2-4 pumps) to affected area, 3-4 times/day. (1 pump = 0.5 gm)    Authorizing Provider: Edward Jolly

## 2019-04-08 ENCOUNTER — Other Ambulatory Visit: Payer: Self-pay | Admitting: Family Medicine

## 2019-04-09 DIAGNOSIS — M205X1 Other deformities of toe(s) (acquired), right foot: Secondary | ICD-10-CM | POA: Diagnosis not present

## 2019-04-09 DIAGNOSIS — T691XXD Chilblains, subsequent encounter: Secondary | ICD-10-CM | POA: Diagnosis not present

## 2019-04-09 DIAGNOSIS — M19071 Primary osteoarthritis, right ankle and foot: Secondary | ICD-10-CM | POA: Insufficient documentation

## 2019-04-12 ENCOUNTER — Telehealth (HOSPITAL_COMMUNITY): Payer: Self-pay | Admitting: *Deleted

## 2019-04-12 MED ORDER — DULOXETINE HCL 60 MG PO CPEP: 120.0000 mg | ORAL_CAPSULE | Freq: Every day | ORAL | 0 refills | Status: DC

## 2019-04-12 NOTE — Addendum Note (Signed)
Addended by: Zena Amos on: 04/12/2019 11:38 AM   Modules accepted: Orders

## 2019-04-12 NOTE — Telephone Encounter (Signed)
REFILL REQUEST  DULoxetine (CYMBALTA) 60 MG capsule 180 capsule   PER PROVIDER AS OF 12/28/2018 APPT AS NEEDED. PER RX  LAST REFILL WAS ON 12/28/2018

## 2019-04-12 NOTE — Telephone Encounter (Signed)
Done

## 2019-04-13 ENCOUNTER — Ambulatory Visit (INDEPENDENT_AMBULATORY_CARE_PROVIDER_SITE_OTHER): Payer: PPO | Admitting: Family Medicine

## 2019-04-13 ENCOUNTER — Other Ambulatory Visit: Payer: Self-pay

## 2019-04-13 DIAGNOSIS — F988 Other specified behavioral and emotional disorders with onset usually occurring in childhood and adolescence: Secondary | ICD-10-CM

## 2019-04-13 NOTE — Progress Notes (Signed)
   Subjective:    Patient ID: Lauren Greene, female    DOB: Dec 13, 1965, 54 y.o.   MRN: [id number] Patient did not complete the visit because of numerous inabilities to connect with her HPI It should be noted that the nurse was able to get the initial information But when I tried to call to connect with the patient on multiple attempts her phone would not pick up and would go to voicemail. Then when I finally was able to connect with her when she called in it was apparent that she was doing a drive-through and we could not get her attention So therefore she will need to call back to reschedule  Patient calls for a follow up on ADHD . Patient states she is doing well on current meds.   Patient states she sees Dr Vanetta Shawl at mental health and they prescribe PRN valium and Cymbalta and she wants to know if we can take over those medications.  Virtual Visit via Video Note  I connected with Melburn Hake on 04/13/19 at 11:00 AM EST by a video enabled telemedicine application and verified that I am speaking with the correct person using two identifiers.  Location: Patient: home Provider: office   I discussed the limitations of evaluation and management by telemedicine and the availability of in person appointments. The patient expressed understanding and agreed to proceed.  History of Present Illness:    Observations/Objective:   Assessment and Plan:   Follow Up Instructions:    I discussed the assessment and treatment plan with the patient. The patient was provided an opportunity to ask questions and all were answered. The patient agreed with the plan and demonstrated an understanding of the instructions.   The patient was advised to call back or seek an in-person evaluation if the symptoms worsen or if the condition fails to improve as anticipated.  I provided 0 minutes of non-face-to-face time during this encounter.      Review of Systems     Objective:   Physical  Exam        Assessment & Plan:  Patient did not complete the provider portion of the visit because of patient's inability to connect with [place]

## 2019-04-15 ENCOUNTER — Ambulatory Visit: Payer: PPO | Admitting: Family Medicine

## 2019-04-26 ENCOUNTER — Ambulatory Visit (INDEPENDENT_AMBULATORY_CARE_PROVIDER_SITE_OTHER): Payer: PPO | Admitting: Family Medicine

## 2019-04-26 ENCOUNTER — Other Ambulatory Visit: Payer: Self-pay

## 2019-04-26 DIAGNOSIS — G894 Chronic pain syndrome: Secondary | ICD-10-CM

## 2019-04-26 DIAGNOSIS — F988 Other specified behavioral and emotional disorders with onset usually occurring in childhood and adolescence: Secondary | ICD-10-CM | POA: Diagnosis not present

## 2019-04-26 DIAGNOSIS — F325 Major depressive disorder, single episode, in full remission: Secondary | ICD-10-CM

## 2019-04-26 DIAGNOSIS — M1990 Unspecified osteoarthritis, unspecified site: Secondary | ICD-10-CM

## 2019-04-26 DIAGNOSIS — K219 Gastro-esophageal reflux disease without esophagitis: Secondary | ICD-10-CM

## 2019-04-26 DIAGNOSIS — T691XXD Chilblains, subsequent encounter: Secondary | ICD-10-CM

## 2019-04-26 MED ORDER — AMLODIPINE BESYLATE 2.5 MG PO TABS: 2.5000 mg | ORAL_TABLET | Freq: Every day | ORAL | 1 refills | Status: DC

## 2019-04-26 MED ORDER — AMPHETAMINE-DEXTROAMPHET ER 30 MG PO CP24: ORAL_CAPSULE | ORAL | 0 refills | Status: DC

## 2019-04-26 MED ORDER — TRAZODONE HCL 100 MG PO TABS: ORAL_TABLET | ORAL | 1 refills | Status: DC

## 2019-04-26 MED ORDER — DULOXETINE HCL 60 MG PO CPEP: 120.0000 mg | ORAL_CAPSULE | Freq: Every day | ORAL | 1 refills | Status: DC

## 2019-04-26 MED ORDER — OMEPRAZOLE 40 MG PO CPDR: DELAYED_RELEASE_CAPSULE | ORAL | 1 refills | Status: DC

## 2019-04-26 MED ORDER — DICLOFENAC SODIUM ER 100 MG PO TB24: ORAL_TABLET | ORAL | 1 refills | Status: DC

## 2019-04-26 NOTE — Progress Notes (Signed)
Subjective:    Patient ID: Lauren Greene, female    DOB: 06-06-1965, 54 y.o.   MRN: [id number]   HPIADD check up. Takes adderall xr 30mg  2 qam.  Patient has adult ADD She takes her medicine regular basis it does well for her Allows her to focus and concentrate without it she did have a difficult time  Patient also sees pain management for her osteoarthritis they have her on regular pain management visits.  That is not something that we overview/prescribed for.  Gets some meds from behavior health and wants to know if dr will take over meds. Trazadone, valium and cymbalta.  Patient's depression has been stable doing well on medicine she would like for Lorin Picket to assume these medicines.  I talked with the patient at length she agrees that with these medicines we do not want to prescribe the Valium because of potential interaction with opioids.  She agrees that we will hold off on that certainly if there was a tragic event such as a loss of a family member we may prescribe just a few but this would never be for an ongoing basis otherwise we will go ahead with the Cymbalta and trazodone.  Patient also has significant troubles with renounce syndrome and having some intermittent ulcers on the toes unfortunately podiatry feels like they have done everything they can patient saw vascular surgeons who felt like everything was is stable as it could get it recommended for her to keep her feet warm and take her medicine.  Virtual Visit via Telephone Note  I connected with [place] on 04/26/19 at 10:30 AM EST by telephone and verified that I am speaking with the correct person using two identifiers.  Location: Patient: home Provider: office   I discussed the limitations, risks, security and privacy concerns of performing an evaluation and management service by telephone and the availability of in person appointments. I also discussed with the patient that there may be a patient responsible  charge related to this service. The patient expressed understanding and agreed to proceed.   History of Present Illness:    Observations/Objective:   Assessment and Plan:   Follow Up Instructions:    I discussed the assessment and treatment plan with the patient. The patient was provided an opportunity to ask questions and all were answered. The patient agreed with the plan and demonstrated an understanding of the instructions.   The patient was advised to call back or seek an in-person evaluation if the symptoms worsen or if the condition fails to improve as anticipated.  I provided 30 minutes of non-face-to-face time during this encounter. This includes reviewing records including psychiatry plus also labs and also documentation and discussion with the patient       Review of Systems  Constitutional: Negative for activity change, appetite change and fatigue.  HENT: Negative for congestion and rhinorrhea.   Respiratory: Negative for cough and shortness of breath.   Cardiovascular: Negative for chest pain and leg swelling.  Gastrointestinal: Negative for abdominal pain and diarrhea.  Endocrine: Negative for polydipsia and polyphagia.  Musculoskeletal: Positive for arthralgias and back pain.  Skin: Negative for color change.  Neurological: Negative for dizziness and weakness.  Psychiatric/Behavioral: Negative for behavioral problems and confusion.       Objective:   Physical Exam  Today's visit was via telephone Physical exam was not possible for this visit       Assessment & Plan:  1. Attention deficit disorder,  unspecified hyperactivity presence ADD meds sent in 3 scripts given follow-up if ongoing troubles drug registry was checked this medication does help her function  2. Gastroesophageal reflux disease without esophagitis She is under good control refills given.  She denies any major setbacks no bleeding issues  3. Arthritis She certainly has significant  osteoarthritis of both knees and also of other joints pain medications do help her this is through her pain medicine specialist  4. Osteoarthritis, unspecified osteoarthritis type, unspecified site Anti-inflammatory twice daily no bleeding issues associated with this currently  5. Morbid obesity ([place]) Very important for the patient to watch diet portions etc. as well as stay active to try to lose some weight  6. Depression, major, in remission ([place]) Depression in remission refills given on medications hold off on the Valium  7. Chronic pain syndrome Followed by her pain management specialist  8. 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  If her situation gets worse we will need to see her in person very important for this patient to keep her feet warm and report to [place] if progressive troubles sometimes this can lead to amputation of toes patient is aware of this

## 2019-05-12 ENCOUNTER — Other Ambulatory Visit: Payer: Self-pay | Admitting: Family Medicine

## 2019-05-12 MED ORDER — NYSTATIN 100000 UNIT/GM EX POWD: CUTANEOUS | 5 refills | Status: DC

## 2019-05-12 NOTE — Telephone Encounter (Signed)
Please clarify with the patient how she is utilizing this then run it by me before refilling thank you

## 2019-05-13 NOTE — Telephone Encounter (Signed)
Uses for canker sores in mouth. Pt states 5-10 ml bid prn.

## 2019-05-13 NOTE — Telephone Encounter (Signed)
May order as 5 mL twice daily as needed for canker sores may have 100 mL bottle with 1 refill thank you

## 2019-05-14 MED ORDER — LIDOCAINE VISCOUS HCL 2 % MT SOLN: OROMUCOSAL | 1 refills | Status: DC

## 2019-05-17 ENCOUNTER — Encounter: Payer: Self-pay | Admitting: Student in an Organized Health Care Education/Training Program

## 2019-05-17 ENCOUNTER — Ambulatory Visit
Payer: PPO | Attending: Student in an Organized Health Care Education/Training Program | Admitting: Student in an Organized Health Care Education/Training Program

## 2019-05-17 ENCOUNTER — Other Ambulatory Visit: Payer: Self-pay

## 2019-05-17 DIAGNOSIS — M47816 Spondylosis without myelopathy or radiculopathy, lumbar region: Secondary | ICD-10-CM

## 2019-05-17 DIAGNOSIS — M25562 Pain in left knee: Secondary | ICD-10-CM | POA: Diagnosis not present

## 2019-05-17 DIAGNOSIS — M797 Fibromyalgia: Secondary | ICD-10-CM | POA: Diagnosis not present

## 2019-05-17 DIAGNOSIS — M25552 Pain in left hip: Secondary | ICD-10-CM

## 2019-05-17 DIAGNOSIS — G894 Chronic pain syndrome: Secondary | ICD-10-CM | POA: Diagnosis not present

## 2019-05-17 DIAGNOSIS — G8929 Other chronic pain: Secondary | ICD-10-CM

## 2019-05-17 DIAGNOSIS — M5136 Other intervertebral disc degeneration, lumbar region: Secondary | ICD-10-CM

## 2019-05-17 DIAGNOSIS — M25561 Pain in right knee: Secondary | ICD-10-CM

## 2019-05-17 DIAGNOSIS — Z79891 Long term (current) use of opiate analgesic: Secondary | ICD-10-CM | POA: Diagnosis not present

## 2019-05-17 DIAGNOSIS — M25551 Pain in right hip: Secondary | ICD-10-CM

## 2019-05-17 MED ORDER — OXYCODONE-ACETAMINOPHEN 10-325 MG PO TABS: 1.0000 | ORAL_TABLET | Freq: Four times a day (QID) | ORAL | 0 refills | Status: DC | PRN

## 2019-05-17 NOTE — Progress Notes (Signed)
Patient: Lauren Greene  Service Category: E/M  Provider: Gillis Santa, MD  DOB: 1965-07-15  DOS: 05/17/2019  Location: Office  MRN: [id number]  Setting: Ambulatory outpatient  Referring Provider: Kathyrn Drown, MD  Type: Established Patient  Specialty: Interventional Pain Management  PCP: Kathyrn Drown, MD  Location: Home  Delivery: TeleHealth     Virtual Encounter - Pain Management PROVIDER NOTE: Information contained herein reflects review and annotations entered in association with encounter. Interpretation of such information and data should be left to medically-trained personnel. Information provided to patient can be located elsewhere in the medical record under "Patient Instructions". Document created using STT-dictation technology, any transcriptional errors that may result from process are unintentional.    Contact & Pharmacy Preferred: [phone] Home: [phone] (home) Mobile: [phone] (mobile) E-mail: [email or web address]  [place] [place] [address] Phone: [phone] Fax: [phone]   Pre-screening  Lauren Greene offered "in-person" vs "virtual" encounter. She indicated preferring virtual for this encounter.   Reason COVID-19*  Social distancing based on CDC and AMA recommendations.   I contacted [name] on 05/17/2019 via telephone.      I clearly identified myself as Gillis Santa, MD. I verified that I was speaking with the correct person using two identifiers (Name: Lauren Greene, and date of birth: June 28, 1965).  This visit was completed via telephone due to the restrictions of the COVID-19 pandemic. All issues as above were discussed and addressed but no physical exam was performed. If it was felt that the patient should be evaluated in the office, they were directed there. The patient verbally consented to this visit. Patient was unable to complete an audio/visual visit due to Technical  difficulties and/or Lack of internet. Due to the catastrophic nature of the COVID-19 pandemic, this visit was done through audio contact only.  Location of the patient: home address (see Epic for details)  Location of the provider: office Consent I sought verbal advanced consent from [name] for virtual visit interactions. I informed [name] of possible security and privacy concerns, risks, and limitations associated with providing "not-in-person" medical evaluation and management services. I also informed [name] of the availability of "in-person" appointments. Finally, I informed her that there would be a charge for the virtual visit and that she could be  personally, fully or partially, financially responsible for it. Ms. Daley expressed understanding and agreed to proceed.   Historic Elements   [name] is a 54 y.o. year old, female patient evaluated today after her last contact with our practice on 02/02/2019. Lauren Greene  has a past medical history of ADHD (attention deficit hyperactivity disorder), Arthritis, Back pain, Broken femur ([place]) (08/27/12), Migraine, Osteoarthritis, Raynaud disease, and Raynaud phenomenon (04/06/2018). She also  has a past surgical history that includes Total knee arthroplasty; Heel spur surgery; Back surgery; IM nailing femoral shaft fracture (Left, 08/27/2012); Femur IM nail (Left, 08/27/2012); Cervical biopsy w/ loop electrode excision (1990's); Endometrial biopsy (04-05-05 and 02-12-06); Tubal ligation; Shoulder surgery (Left); and Vaginal hysterectomy. [name] has a current medication list which includes the following prescription(s): amlodipine, amphetamine-dextroamphetamine, amphetamine-dextroamphetamine, diclofenac sodium cr, duloxetine, lidocaine, lidocaine, NONFORMULARY OR COMPOUNDED ITEM, nystatin, omeprazole, oxybutynin chloride, [START ON 06/12/2019] oxycodone-acetaminophen, [START ON 07/12/2019] oxycodone-acetaminophen,  [START ON 08/11/2019] oxycodone-acetaminophen, and trazodone. She  reports that she quit smoking about 18 years ago. Her smoking use included cigarettes. She quit after 10.00 years of use. She  has never used smokeless tobacco. She reports current alcohol use. She reports that she does not use drugs. Lauren Greene is allergic to wellbutrin [bupropion] and lyrica [pregabalin].   HPI  Today, she is being contacted for medication management.   No change in medical history since last visit.  Patient's pain is at baseline.  Patient continues multimodal pain regimen as prescribed.  States that it provides pain relief and improvement in functional status.  Pharmacotherapy Assessment  Analgesic: 05/13/2019  1   02/02/2019  Oxycodone-Acetaminophen 10-325  120.00  30 Bi Lat   [id number]   Car (9744)   0  60.00 MME  Comm Ins   [place]     Monitoring: [place] PMP: PDMP reviewed during this encounter.       Pharmacotherapy: No side-effects or adverse reactions reported. Compliance: No problems identified. Effectiveness: Clinically acceptable. Plan: Refer to "POC".  UDS:  Summary  Date Value Ref Range Status  06/04/2018 FINAL  Final    Comment:    ==================================================================== TOXASSURE SELECT 13 (MW) ==================================================================== Test                             Result       Flag       Units Drug Present and Declared for Prescription Verification   Amphetamine                    >3106        EXPECTED   ng/mg creat    Amphetamine is available as a schedule II prescription drug.   Oxycodone                      >3106        EXPECTED   ng/mg creat   Oxymorphone                    2325         EXPECTED   ng/mg creat   Noroxycodone                   >3106        EXPECTED   ng/mg creat   Noroxymorphone                 378          EXPECTED   ng/mg creat    Sources of oxycodone are scheduled prescription medications.    Oxymorphone,  noroxycodone, and noroxymorphone are expected    metabolites of oxycodone. Oxymorphone is also available as a    scheduled prescription medication. Drug Absent but Declared for Prescription Verification   Diazepam                       Not Detected UNEXPECTED ng/mg creat ==================================================================== Test                      Result    Flag   Units      Ref Range   Creatinine              322              mg/dL      >=20 ==================================================================== Declared Medications:  The flagging and interpretation on this report are based on the  following declared medications.  Unexpected results may arise from  inaccuracies in the declared medications.  **  Note: The testing scope of this panel includes these medications:  Amphetamine (Amphetamine-Dextroamphetamine)  Diazepam  Oxycodone (Percocet)  **Note: The testing scope of this panel does not include following  reported medications:  Acetaminophen (Percocet)  Amlodipine  Clobetasol  Diclofenac  Duloxetine  Eye Drop (Sulfacetamide)  Lidocaine  Methocarbamol  Nystatin  Omeprazole  Oxybutynin  Sulfamethoxazole (Bactrim)  Trazodone (Desyrel)  Trimethoprim (Bactrim) ==================================================================== For clinical consultation, please call [phone]. ====================================================================    Laboratory Chemistry Profile   Renal Lab Results  Component Value Date   BUN 16 01/09/2018   CREATININE 0.70 01/09/2018   BCR 23 01/09/2018   GFRAA 114 01/09/2018   GFRNONAA 99 01/09/2018    Hepatic Lab Results  Component Value Date   AST 26 03/15/2017   ALT 26 03/15/2017   ALBUMIN 4.3 03/15/2017   ALKPHOS 117 03/15/2017   LIPASE 25 01/12/2016    Electrolytes Lab Results  Component Value Date   NA 142 01/09/2018   K 4.5 01/09/2018   CL 103 01/09/2018   CALCIUM 9.2 01/09/2018     Bone Lab Results  Component Value Date   VD25OH 31 01/07/2013    Inflammation (CRP: Acute Phase) (ESR: Chronic Phase) Lab Results  Component Value Date   CRP 1.7 08/03/2014   ESRSEDRATE 4 04/06/2018      Note: Above Lab results reviewed.  Imaging  MM 3D SCREEN BREAST BILATERAL CLINICAL DATA:  Screening.  EXAM: DIGITAL SCREENING BILATERAL MAMMOGRAM WITH TOMO AND CAD  COMPARISON:  Previous exam(s).  ACR Breast Density Category b: There are scattered areas of fibroglandular density.  FINDINGS: There are no findings suspicious for malignancy. Images were processed with CAD.  IMPRESSION: No mammographic evidence of malignancy. A result letter of this screening mammogram will be mailed directly to the patient.  RECOMMENDATION: Screening mammogram in one year. (Code:[id number]-01Y)  BI-RADS CATEGORY  1: Negative.  Electronically Signed   By: Lovey Newcomer M.D.   On: 01/01/2019 12:50  Assessment  The primary encounter diagnosis was Lumbar degenerative disc disease. Diagnoses of Lumbar spondylosis, Long term current use of opiate analgesic, Long term prescription opiate use, Chronic arthralgias of knees and hips, Fibromyalgia, and Chronic pain syndrome were also pertinent to this visit.  Plan of Care   Ms. GIORGIA WAHLER has a current medication list which includes the following long-term medication(s): amlodipine, amphetamine-dextroamphetamine, amphetamine-dextroamphetamine, duloxetine, omeprazole, and trazodone.  Pharmacotherapy (Medications Ordered): Meds ordered this encounter  Medications  . oxyCODONE-acetaminophen (PERCOCET) 10-325 MG tablet    Sig: Take 1 tablet by mouth every 6 (six) hours as needed for pain. Must last 30 days.    Dispense:  120 tablet    Refill:  0    [place] STOP ACT - Not applicable. Fill one day early if pharmacy is closed on scheduled refill date.  Marland Kitchen oxyCODONE-acetaminophen (PERCOCET) 10-325 MG tablet    Sig: Take 1 tablet by mouth every 6 (six)  hours as needed for pain. Must last 30 days.    Dispense:  120 tablet    Refill:  0    [place] STOP ACT - Not applicable. Fill one day early if pharmacy is closed on scheduled refill date.  Marland Kitchen oxyCODONE-acetaminophen (PERCOCET) 10-325 MG tablet    Sig: Take 1 tablet by mouth every 6 (six) hours as needed for pain. Must last 30 days.    Dispense:  120 tablet    Refill:  0    [place] STOP ACT - Not applicable. Fill one day early if  pharmacy is closed on scheduled refill date.   Continue multimodal analgesics including Cymbalta 120 mg daily, diclofenac 100 mg, compounded cream  6% Gabapentin, 2.5% Lidocaine, 0.5% Meloxicam, 5% Methocarbamol, 2.5% Prilocaine. Dispense: 120 gm Pump Bottle. (Dispenser: 1 pump = 0.5 gm.)  Orders:  Orders Placed This Encounter  Procedures  . ToxASSURE Select 13 (MW), Urine    Volume: 30 ml(s). Minimum 3 ml of urine is needed. Document temperature of fresh sample. Indications: Long term (current) use of opiate analgesic (O64.314)   Follow-up plan:   Return in about 3 months (around 08/17/2019) for Medication Management, in person.    Recent Visits No visits were found meeting these conditions.  Showing recent visits within past 90 days and meeting all other requirements   Today's Visits Date Type Provider Dept  05/17/19 Office Visit Gillis Santa, MD [facility]  Showing today's visits and meeting all other requirements   Future Appointments No visits were found meeting these conditions.  Showing future appointments within next 90 days and meeting all other requirements   I discussed the assessment and treatment plan with the patient. The patient was provided an opportunity to ask questions and all were answered. The patient agreed with the plan and demonstrated an understanding of the instructions.  Patient advised to call back or seek an in-person evaluation if the symptoms or condition worsens.  Duration of encounter: 25mnutes.  Note by: BGillis Santa MD Date: 05/17/2019; Time: 2:49 PM

## 2019-05-18 ENCOUNTER — Other Ambulatory Visit: Payer: Self-pay | Admitting: Obstetrics & Gynecology

## 2019-05-19 NOTE — Telephone Encounter (Signed)
Medication refill request: Lidocaine Last AEX:  04-14-17 SM  Next AEX: not scheduled Last MMG (if hormonal medication request): n/a Refill authorized: Today, please advise.   Medication pended for #35.44g, 0RF. Please refill if appropriate.

## 2019-05-22 ENCOUNTER — Other Ambulatory Visit: Payer: Self-pay

## 2019-05-22 ENCOUNTER — Ambulatory Visit: Payer: PPO | Attending: Internal Medicine

## 2019-05-22 DIAGNOSIS — Z23 Encounter for immunization: Secondary | ICD-10-CM | POA: Insufficient documentation

## 2019-05-22 NOTE — Progress Notes (Signed)
   Covid-19 Vaccination Clinic  Name:  Lauren Greene    MRN: [id number] DOB: 1965-12-28  05/22/2019  Lauren Greene was observed post Covid-19 immunization for 15 minutes without incident. She was provided with Vaccine Information Sheet and instruction to access the V-Safe system.   Lauren Greene was instructed to call 911 with any severe reactions post vaccine: Marland Kitchen Difficulty breathing  . Swelling of face and throat  . A fast heartbeat  . A bad rash all over body  . Dizziness and weakness   Immunizations Administered    Name Date Dose VIS Date Route   Moderna COVID-19 Vaccine 05/22/2019 11:48 AM 0.5 mL 02/16/2019 Intramuscular   Manufacturer: Moderna   Lot: [id number]   NDC: 40375-436-06

## 2019-06-01 DIAGNOSIS — G894 Chronic pain syndrome: Secondary | ICD-10-CM | POA: Diagnosis not present

## 2019-06-04 LAB — TOXASSURE SELECT 13 (MW), URINE

## 2019-06-23 ENCOUNTER — Ambulatory Visit: Payer: PPO | Attending: Internal Medicine

## 2019-06-23 DIAGNOSIS — Z23 Encounter for immunization: Secondary | ICD-10-CM

## 2019-06-23 NOTE — Progress Notes (Signed)
   Covid-19 Vaccination Clinic  Name:  Lauren Greene    MRN: [id number] DOB: [birth year]  06/23/2019  [name] was observed post Covid-19 immunization for 15 minutes without incident. She was provided with Vaccine Information Sheet and instruction to access the V-Safe system.   [name] was instructed to call 911 with any severe reactions post vaccine: Marland Kitchen Difficulty breathing  . Swelling of face and throat  . A fast heartbeat  . A bad rash all over body  . Dizziness and weakness   Immunizations Administered    Name Date Dose VIS Date Route   Moderna COVID-19 Vaccine 06/23/2019  3:33 PM 0.5 mL 02/16/2019 Intramuscular   Manufacturer: Gala Murdoch   Lot: [id number]   NDC: 96895-702-20

## 2019-06-25 ENCOUNTER — Other Ambulatory Visit: Payer: Self-pay | Admitting: Family Medicine

## 2019-07-19 ENCOUNTER — Telehealth: Payer: Self-pay | Admitting: Family Medicine

## 2019-07-19 NOTE — Telephone Encounter (Signed)
Pt called, states 2 weeks ago an oral surgeon removed an abscessed tooth, since then pt has developed a "hard place" between her jaw & ear  States the oral surgeon told her to use ice/heat - pt states she's been doing that with minimal relief, states "it goes up & down"   Pt states she's in a lot of pain & don't know what to do   Please advise & call pt

## 2019-07-20 ENCOUNTER — Other Ambulatory Visit: Payer: Self-pay | Admitting: Family Medicine

## 2019-07-20 NOTE — Telephone Encounter (Signed)
It is hard to know what is causing this it is even possible it could even be localized infection or swollen lymph node-we will be happy to see the patient should she want to schedule an appointment to be evaluated thank you

## 2019-07-20 NOTE — Telephone Encounter (Signed)
That is fine, we are limited what we can do with this type of situation

## 2019-07-20 NOTE — Telephone Encounter (Signed)
Patient states she had the abscessed tooth removed and ended up with a head, face and throat infection which ended up with them putting in a drain. The drain was removed and since then she has been doing what the surgeon told her to do as far as salt rinses, massages to the area and hot showers. Patient was on clindamycin and completed that. She said she contacted the surgeons office yesterday and let them know and he advised her to continue doing what she was doing. Patient states she is in pain and nauseous and concerned, when she does the rinses and massages the swelling will go down but a short while later will come back. She was on her way home and did not want to come into the office today but is going to send you a picture on mychart.

## 2019-07-21 ENCOUNTER — Encounter: Payer: Self-pay | Admitting: Family Medicine

## 2019-07-21 NOTE — Telephone Encounter (Signed)
Patient was replied to via [employer]

## 2019-07-22 DIAGNOSIS — K219 Gastro-esophageal reflux disease without esophagitis: Secondary | ICD-10-CM | POA: Diagnosis not present

## 2019-07-22 DIAGNOSIS — K0889 Other specified disorders of teeth and supporting structures: Secondary | ICD-10-CM | POA: Diagnosis not present

## 2019-07-22 DIAGNOSIS — D72829 Elevated white blood cell count, unspecified: Secondary | ICD-10-CM | POA: Diagnosis not present

## 2019-07-22 DIAGNOSIS — K047 Periapical abscess without sinus: Secondary | ICD-10-CM | POA: Diagnosis not present

## 2019-07-22 DIAGNOSIS — K122 Cellulitis and abscess of mouth: Secondary | ICD-10-CM | POA: Diagnosis not present

## 2019-07-22 DIAGNOSIS — F909 Attention-deficit hyperactivity disorder, unspecified type: Secondary | ICD-10-CM | POA: Diagnosis not present

## 2019-07-22 DIAGNOSIS — Z79899 Other long term (current) drug therapy: Secondary | ICD-10-CM | POA: Diagnosis not present

## 2019-08-14 ENCOUNTER — Other Ambulatory Visit: Payer: Self-pay | Admitting: Family Medicine

## 2019-08-17 ENCOUNTER — Encounter: Payer: PPO | Admitting: Student in an Organized Health Care Education/Training Program

## 2019-08-23 ENCOUNTER — Ambulatory Visit
Payer: PPO | Attending: Student in an Organized Health Care Education/Training Program | Admitting: Student in an Organized Health Care Education/Training Program

## 2019-08-23 ENCOUNTER — Encounter: Payer: Self-pay | Admitting: Student in an Organized Health Care Education/Training Program

## 2019-08-23 ENCOUNTER — Other Ambulatory Visit: Payer: Self-pay

## 2019-08-23 VITALS — BP 145/97 | HR 69 | Temp 97.9°F | Resp 16 | Ht 68.0 in | Wt 232.0 lb

## 2019-08-23 DIAGNOSIS — Z79891 Long term (current) use of opiate analgesic: Secondary | ICD-10-CM | POA: Diagnosis not present

## 2019-08-23 DIAGNOSIS — M48062 Spinal stenosis, lumbar region with neurogenic claudication: Secondary | ICD-10-CM | POA: Insufficient documentation

## 2019-08-23 DIAGNOSIS — M5416 Radiculopathy, lumbar region: Secondary | ICD-10-CM | POA: Diagnosis not present

## 2019-08-23 DIAGNOSIS — G894 Chronic pain syndrome: Secondary | ICD-10-CM

## 2019-08-23 DIAGNOSIS — G8929 Other chronic pain: Secondary | ICD-10-CM | POA: Insufficient documentation

## 2019-08-23 DIAGNOSIS — M47816 Spondylosis without myelopathy or radiculopathy, lumbar region: Secondary | ICD-10-CM | POA: Diagnosis not present

## 2019-08-23 HISTORY — DX: Radiculopathy, lumbar region: M54.16

## 2019-08-23 MED ORDER — OXYCODONE-ACETAMINOPHEN 10-325 MG PO TABS: 1.0000 | ORAL_TABLET | Freq: Four times a day (QID) | ORAL | 0 refills | Status: DC | PRN

## 2019-08-23 NOTE — Progress Notes (Signed)
Nursing Pain Medication Assessment:  Safety precautions to be maintained throughout the outpatient stay will include: orient to surroundings, keep bed in low position, maintain call bell within reach at all times, provide assistance with transfer out of bed and ambulation.  Medication Inspection Compliance: Lauren Greene did not comply with our request to bring her pills to be counted. She was reminded that bringing the medication bottles, even when empty, is a requirement.  Medication: None brought in. Pill/Patch Count: None available to be counted. Bottle Appearance: No container available. Did not bring bottle(s) to appointment. Filled Date: N/A Last Medication intake:  Today

## 2019-08-23 NOTE — Progress Notes (Signed)
PROVIDER NOTE: Information contained herein reflects review and annotations entered in association with encounter. Interpretation of such information and data should be left to medically-trained personnel. Information provided to patient can be located elsewhere in the medical record under "Patient Instructions". Document created using STT-dictation technology, any transcriptional errors that may result from process are unintentional.    Patient: Lauren Greene  Service Category: E/M  Provider: Gillis Santa, MD  DOB: 1965/04/07  DOS: 08/23/2019  Specialty: Interventional Pain Management  MRN: [id number]  Setting: Ambulatory outpatient  PCP: Kathyrn Drown, MD  Type: Established Patient    Referring Provider: Kathyrn Drown, MD  Location: Office  Delivery: Face-to-face     HPI  Reason for encounter: Lauren Greene, a 54 y.o. year old female, is here today for evaluation and management of her Lumbar radiculopathy [M54.16]. Lauren Greene primary complain today is Back Pain (lumbar bilateral, midline ) Last encounter: Practice (05/17/2019). My last encounter with her was on 05/17/2019. Pertinent problems: Lauren Greene has Periprosthetic fracture around internal prosthetic left knee joint; Chronic back pain; Morbid obesity ([place]); Lumbar degenerative disc disease; Chronic, continuous use of opioids; Fibromyalgia; Lumbar spondylosis; Chronic pain syndrome; and Long term current use of opiate analgesic on their pertinent problem list. Pain Assessment: Severity of Chronic pain is reported as a 7 /10. Location: Back Lower, Left, Right/denies. Onset: More than a month ago. Quality: Discomfort, Burning, Constant. Timing: Constant. Modifying factor(s): pain medications. Vitals:  height is _0  (1.727 m) and weight is 232 lb (105.2 kg). Her temporal temperature is 97.9 F (36.6 C). Her blood pressure is 145/97 (abnormal) and her pulse is 69. Her respiration is 16 and oxygen saturation is 99%.   Patient  follows up today for medication management.  No significant changes in her pain history.  Of note patient did have a oral pharyngeal abscess.  She was unable to eat as she normally would for approximately 8 to 10 days.  She states that she lost approximately 20 pounds.  She continues to endorse bilateral foot pain.  She is also endorsing superior sacral pain in her low back.  This is worse with bending and twisting.  She is taking care of her granddaughter as well and is holding her throughout the day which is causing some shoulder discomfort.  Otherwise she continues her medications as prescribed.  No constipation or side effects noted.  Will repeat urine toxicology screen and refill as below.  ROS  Constitutional: Denies any fever or chills Gastrointestinal: No reported hemesis, hematochezia, vomiting, or acute GI distress Musculoskeletal: Denies any acute onset joint swelling, redness, loss of ROM, or weakness Neurological: No reported episodes of acute onset apraxia, aphasia, dysarthria, agnosia, amnesia, paralysis, loss of coordination, or loss of consciousness  Medication Review  DULoxetine, Diclofenac Sodium CR, Oxybutynin Chloride, amLODipine, amphetamine-dextroamphetamine, lidocaine, nystatin, omeprazole, oxyCODONE-acetaminophen, and traZODone  History Review  Allergy: Lauren Greene is allergic to wellbutrin [bupropion] and lyrica [pregabalin]. Drug: Lauren Greene  reports no history of drug use. Alcohol:  reports current alcohol use. Tobacco:  reports that she quit smoking about 19 years ago. Her smoking use included cigarettes. She quit after 10.00 years of use. She has never used smokeless tobacco. Social: Lauren Greene  reports that she quit smoking about 19 years ago. Her smoking use included cigarettes. She quit after 10.00 years of use. She has never used smokeless tobacco. She reports current alcohol use. She reports that she does not use drugs. Medical:  has a  past medical history of  ADHD (attention deficit hyperactivity disorder), Arthritis, Back pain, Broken femur ([place]) (08/27/12), Migraine, Osteoarthritis, Raynaud disease, and Raynaud phenomenon (04/06/2018). Surgical: Lauren Greene  has a past surgical history that includes Total knee arthroplasty; Heel spur surgery; Back surgery; IM nailing femoral shaft fracture (Left, 08/27/2012); Femur IM nail (Left, 08/27/2012); Cervical biopsy w/ loop electrode excision (1990's); Endometrial biopsy (04-05-05 and 02-12-06); Tubal ligation; Shoulder surgery (Left); and Vaginal hysterectomy. Family: family history includes Bipolar disorder in her mother and sister; Breast cancer in her mother; Cancer in her father, mother, and another family member; Heart failure in her father and another family member.  Laboratory Chemistry Profile   Renal Lab Results  Component Value Date   BUN 16 01/09/2018   CREATININE 0.70 01/09/2018   BCR 23 01/09/2018   GFRAA 114 01/09/2018   GFRNONAA 99 01/09/2018     Hepatic Lab Results  Component Value Date   AST 26 03/15/2017   ALT 26 03/15/2017   ALBUMIN 4.3 03/15/2017   ALKPHOS 117 03/15/2017   LIPASE 25 01/12/2016     Electrolytes Lab Results  Component Value Date   NA 142 01/09/2018   K 4.5 01/09/2018   CL 103 01/09/2018   CALCIUM 9.2 01/09/2018     Bone Lab Results  Component Value Date   VD25OH 31 01/07/2013     Inflammation (CRP: Acute Phase) (ESR: Chronic Phase) Lab Results  Component Value Date   CRP 1.7 08/03/2014   ESRSEDRATE 4 04/06/2018       Note: Above Lab results reviewed.  Recent Imaging Review  MM 3D SCREEN BREAST BILATERAL CLINICAL DATA:  Screening.  EXAM: DIGITAL SCREENING BILATERAL MAMMOGRAM WITH TOMO AND CAD  COMPARISON:  Previous exam(s).  ACR Breast Density Category b: There are scattered areas of fibroglandular density.  FINDINGS: There are no findings suspicious for malignancy. Images were processed with CAD.  IMPRESSION: No mammographic  evidence of malignancy. A result letter of this screening mammogram will be mailed directly to the patient.  RECOMMENDATION: Screening mammogram in one year. (Code:[id number]-B-01Y)  BI-RADS CATEGORY  1: Negative.  Electronically Signed   By: Lovey Newcomer M.D.   On: 01/01/2019 12:50 Note: Reviewed        Physical Exam  General appearance: Well nourished, well developed, and well hydrated. In no apparent acute distress Mental status: Alert, oriented x 3 (person, place, & time)       Respiratory: No evidence of acute respiratory distress Eyes: PERLA Vitals: BP (!) 145/97 (BP Location: Right Arm, Patient Position: Sitting, Cuff Size: Normal)   Pulse 69   Temp 97.9 F (36.6 C) (Temporal)   Resp 16   Ht _0  (1.727 m)   Wt 232 lb (105.2 kg)   LMP 03/18/2004   SpO2 99%   BMI 35.28 kg/m  BMI: Estimated body mass index is 35.28 kg/m as calculated from the following:   Height as of this encounter: _1  (1.727 m).   Weight as of this encounter: 232 lb (105.2 kg). Ideal: Ideal body weight: 63.9 kg (140 lb 14 oz) Adjusted ideal body weight: 80.4 kg (177 lb 5.2 oz)  Lumbar Spine Area Exam  Skin & Axial Inspection: No masses, redness, or swelling Alignment: Symmetrical Functional ROM: Pain restricted ROM affecting both sides Stability: No instability detected Muscle Tone/Strength: Functionally intact. No obvious neuro-muscular anomalies detected. Sensory (Neurological): Musculoskeletal pain pattern  Lower Extremity Exam    Side: Right lower extremity  Side: Left lower extremity  Stability: No instability observed          Stability: No instability observed          Skin & Extremity Inspection: Skin color, temperature, and hair growth are WNL. No peripheral edema or cyanosis. No masses, redness, swelling, asymmetry, or associated skin lesions. No contractures.  Skin & Extremity Inspection: Skin color, temperature, and hair growth are WNL. No peripheral edema or cyanosis. No masses, redness,  swelling, asymmetry, or associated skin lesions. No contractures.  Functional ROM: Pain restricted ROM for hip and knee joints          Functional ROM: Pain restricted ROM for hip and knee joints          Muscle Tone/Strength: Functionally intact. No obvious neuro-muscular anomalies detected.  Muscle Tone/Strength: Functionally intact. No obvious neuro-muscular anomalies detected.  Sensory (Neurological): Arthropathic arthralgia        Sensory (Neurological): Arthropathic arthralgia        DTR: Patellar: deferred today Achilles: deferred today Plantar: deferred today  DTR: Patellar: deferred today Achilles: deferred today Plantar: deferred today  Palpation: No palpable anomalies  Palpation: No palpable anomalies     Assessment   Status Diagnosis  Controlled Controlled Controlled 1. Lumbar radiculopathy   2. Chronic radicular lumbar pain   3. Lumbar facet arthropathy   4. Lumbar spondylosis   5. Spinal stenosis, lumbar region, with neurogenic claudication   6. Encounter for long-term opiate analgesic use   7. Chronic pain syndrome      Updated Problems: Problem  Long Term Current Use of Opiate Analgesic  Lumbar Spondylosis  Chronic Pain Syndrome  Lumbar Degenerative Disc Disease  Chronic, Continuous Use of Opioids  Fibromyalgia  Morbid Obesity ([facility])  Chronic Back Pain   Under pain managements care   Periprosthetic Fracture Around Internal Prosthetic Left Knee Joint  Chronic Radicular Lumbar Pain    Plan of Care  Lauren Greene has a current medication list which includes the following long-term medication(s): amlodipine, amphetamine-dextroamphetamine, duloxetine, omeprazole, trazodone, amphetamine-dextroamphetamine, and amphetamine-dextroamphetamine.  Pharmacotherapy (Medications Ordered): Meds ordered this encounter  Medications  . oxyCODONE-acetaminophen (PERCOCET) 10-325 MG tablet    Sig: Take 1 tablet by mouth every 6 (six) hours as needed for pain.  Must last 30 days.    Dispense:  120 tablet    Refill:  0    [place] STOP ACT - Not applicable. Fill one day early if pharmacy is closed on scheduled refill date.  Marland Kitchen oxyCODONE-acetaminophen (PERCOCET) 10-325 MG tablet    Sig: Take 1 tablet by mouth every 6 (six) hours as needed for pain. Must last 30 days.    Dispense:  120 tablet    Refill:  0    [place] STOP ACT - Not applicable. Fill one day early if pharmacy is closed on scheduled refill date.  Marland Kitchen oxyCODONE-acetaminophen (PERCOCET) 10-325 MG tablet    Sig: Take 1 tablet by mouth every 6 (six) hours as needed for pain. Must last 30 days.    Dispense:  120 tablet    Refill:  0    [place] STOP ACT - Not applicable. Fill one day early if pharmacy is closed on scheduled refill date.   Orders:  Orders Placed This Encounter  Procedures  . ToxASSURE Select 13 (MW), Urine    Volume: 30 ml(s). Minimum 3 ml of urine is needed. Document temperature of fresh sample. Indications: Long term (current) use of opiate analgesic (W11.914)   Follow-up plan:   Return in about  3 months (around 11/23/2019) for Medication Management, in person.    Recent Visits No visits were found meeting these conditions.  Showing recent visits within past 90 days and meeting all other requirements   Today's Visits Date Type Provider Dept  08/23/19 Office Visit Gillis Santa, MD [facility]  Showing today's visits and meeting all other requirements   Future Appointments No visits were found meeting these conditions.  Showing future appointments within next 90 days and meeting all other requirements   I discussed the assessment and treatment plan with the patient. The patient was provided an opportunity to ask questions and all were answered. The patient agreed with the plan and demonstrated an understanding of the instructions.  Patient advised to call back or seek an in-person evaluation if the symptoms or condition worsens.  Duration of encounter: 30  minutes.  Note by: Gillis Santa, MD Date: 08/23/2019; Time: 3:58 PM

## 2019-08-25 ENCOUNTER — Encounter: Payer: PPO | Admitting: Student in an Organized Health Care Education/Training Program

## 2019-08-28 LAB — TOXASSURE SELECT 13 (MW), URINE

## 2019-08-30 ENCOUNTER — Other Ambulatory Visit: Payer: Self-pay

## 2019-08-30 ENCOUNTER — Other Ambulatory Visit: Payer: Self-pay | Admitting: Family Medicine

## 2019-08-30 ENCOUNTER — Encounter: Payer: Self-pay | Admitting: Family Medicine

## 2019-08-30 ENCOUNTER — Ambulatory Visit (INDEPENDENT_AMBULATORY_CARE_PROVIDER_SITE_OTHER): Payer: PPO | Admitting: Family Medicine

## 2019-08-30 VITALS — BP 138/86 | Temp 97.9°F | Ht 68.0 in | Wt 239.0 lb

## 2019-08-30 DIAGNOSIS — I10 Essential (primary) hypertension: Secondary | ICD-10-CM | POA: Diagnosis not present

## 2019-08-30 DIAGNOSIS — E7849 Other hyperlipidemia: Secondary | ICD-10-CM | POA: Diagnosis not present

## 2019-08-30 DIAGNOSIS — F988 Other specified behavioral and emotional disorders with onset usually occurring in childhood and adolescence: Secondary | ICD-10-CM | POA: Diagnosis not present

## 2019-08-30 MED ORDER — AMPHETAMINE-DEXTROAMPHET ER 30 MG PO CP24: ORAL_CAPSULE | ORAL | 0 refills | Status: DC

## 2019-08-30 MED ORDER — AMLODIPINE BESYLATE 2.5 MG PO TABS: 2.5000 mg | ORAL_TABLET | Freq: Every day | ORAL | 1 refills | Status: DC

## 2019-08-30 MED ORDER — TRAZODONE HCL 100 MG PO TABS: ORAL_TABLET | ORAL | 1 refills | Status: DC

## 2019-08-30 MED ORDER — NYSTATIN 100000 UNIT/GM EX POWD: CUTANEOUS | 12 refills | Status: DC

## 2019-08-30 MED ORDER — OMEPRAZOLE 40 MG PO CPDR: DELAYED_RELEASE_CAPSULE | ORAL | 1 refills | Status: DC

## 2019-08-30 NOTE — Progress Notes (Signed)
   Subjective:    Patient ID: Lauren Greene, female    DOB: 10-Apr-1965, 54 y.o.   MRN: [id number]  HPI  Patient arrives for a follow up on ADHD medication. Patient states she is doing well on current meds with no concerns. States ADD Medication does help Takes it faithfully Denies any problems with Is followed by pain management they did a urine drug screen recently  Patient also needs refills on omeprazole and nystatin powder. Patient states overall stomach is doing well Review of Systems  Constitutional: Negative for activity change, appetite change and fatigue.  HENT: Negative for congestion.   Respiratory: Negative for cough.   Cardiovascular: Negative for chest pain.  Gastrointestinal: Negative for abdominal pain.  Musculoskeletal: Positive for arthralgias. Negative for back pain.  Skin: Negative for color change.  Neurological: Negative for headaches.  Psychiatric/Behavioral: Negative for behavioral problems.       Objective:   Physical Exam Vitals reviewed.  Constitutional:      General: She is not in acute distress. HENT:     Head: Normocephalic.  Cardiovascular:     Rate and Rhythm: Normal rate and regular rhythm.     Heart sounds: Normal heart sounds. No murmur heard.   Pulmonary:     Effort: Pulmonary effort is normal.     Breath sounds: Normal breath sounds.  Lymphadenopathy:     Cervical: No cervical adenopathy.  Neurological:     Mental Status: She is alert.  Psychiatric:        Behavior: Behavior normal.            Assessment & Plan:  1. Essential hypertension Decent control.  Watch diet minimize salt try to lose weight - Basic metabolic panel  2. Attention deficit disorder, unspecified hyperactivity presence Does well with her medicine allows her to focus states she sees benefit from it when she does not take it she has difficult time 3 scripts were sent in patient to do follow-up visit in 3 months drug registry checked urine drug screen  reviewed  3. Other hyperlipidemia Check lab work await results - Lipid panel

## 2019-08-31 LAB — BASIC METABOLIC PANEL
BUN/Creatinine Ratio: 20 (ref 9–23)
BUN: 13 mg/dL (ref 6–24)
CO2: 23 mmol/L (ref 20–29)
Calcium: 9.2 mg/dL (ref 8.7–10.2)
Chloride: 105 mmol/L (ref 96–106)
Creatinine, Ser: 0.66 mg/dL (ref 0.57–1.00)
GFR calc Af Amer: 115 mL/min/{1.73_m2} (ref >59)
GFR calc non Af Amer: 100 mL/min/{1.73_m2} (ref >59)
Glucose: 90 mg/dL (ref 65–99)
Potassium: 5.2 mmol/L (ref 3.5–5.2)
Sodium: 141 mmol/L (ref 134–144)

## 2019-08-31 LAB — LIPID PANEL
Chol/HDL Ratio: 3.5 ratio (ref 0.0–4.4)
Cholesterol, Total: 187 mg/dL (ref 100–199)
HDL: 54 mg/dL (ref >39)
LDL Chol Calc (NIH): 115 mg/dL — ABNORMAL HIGH (ref 0–99)
Triglycerides: 98 mg/dL (ref 0–149)
VLDL Cholesterol Cal: 18 mg/dL (ref 5–40)

## 2019-09-14 ENCOUNTER — Other Ambulatory Visit: Payer: Self-pay

## 2019-09-14 ENCOUNTER — Encounter: Payer: Self-pay | Admitting: Emergency Medicine

## 2019-09-14 ENCOUNTER — Ambulatory Visit
Admission: EM | Admit: 2019-09-14 | Discharge: 2019-09-14 | Disposition: A | Discharge status: Home / Self Care | Payer: PPO | Attending: Emergency Medicine | Admitting: Emergency Medicine

## 2019-09-14 DIAGNOSIS — R21 Rash and other nonspecific skin eruption: Secondary | ICD-10-CM

## 2019-09-14 DIAGNOSIS — T7840XA Allergy, unspecified, initial encounter: Secondary | ICD-10-CM

## 2019-09-14 DIAGNOSIS — T63441A Toxic effect of venom of bees, accidental (unintentional), initial encounter: Secondary | ICD-10-CM | POA: Diagnosis not present

## 2019-09-14 MED ORDER — PREDNISONE 10 MG (21) PO TBPK
ORAL_TABLET | ORAL | 0 refills | Status: DC
Start: 2019-09-14 — End: 2019-11-23

## 2019-09-14 MED ORDER — CETIRIZINE HCL 10 MG PO TABS: 10.0000 mg | ORAL_TABLET | Freq: Every day | ORAL | 0 refills | Status: DC

## 2019-09-14 MED ORDER — METHYLPREDNISOLONE SODIUM SUCC 125 MG IJ SOLR
80.0000 mg | Freq: Once | INTRAMUSCULAR | Status: AC
  Administered 2019-09-14: 80 mg via INTRAMUSCULAR

## 2019-09-14 NOTE — ED Triage Notes (Addendum)
Pt was stung by yellow jacket on RT arm last night, arm is swollen and red.  States she needs a steroid shot.

## 2019-09-14 NOTE — ED Provider Notes (Signed)
[facility]   [id number] 09/14/19 Arrival Time: 1403   Chief Complaint  Patient presents with  . Insect Bite     SUBJECTIVE: History from: patient.  Lauren Greene is a 54 y.o. female who presents to the urgent care with a complaint of bee sting to right arm that occurred last night.  Reports itching, swelling and redness to the right arm today.  States she was doing some yard work last night and find a Scientist, forensic. One of the bee bite her.  Localizes the sting to her right arm.  Denies previous hx of bee sting.  Denies fever, chills, nausea, vomiting, headache, dizziness, weakness, fatigue, rash, or abdominal pain.       ROS: As per HPI.  All other pertinent ROS negative.     Past Medical History:  Diagnosis Date  . ADHD (attention deficit hyperactivity disorder)   . Arthritis   . Back pain   . Broken femur ([facility]) 08/27/12  . Migraine   . Osteoarthritis   . Raynaud disease   . Raynaud phenomenon 04/06/2018   [facility] podiatry diagnosed 2020   Past Surgical History:  Procedure Laterality Date  . BACK SURGERY    . CERVICAL BIOPSY  W/ LOOP ELECTRODE EXCISION  1990's   in [place], [place]  . ENDOMETRIAL BIOPSY  04-05-05 and 02-12-06   04-05-05 benign but too rapid growth. RXd Provera and repeat Bx 43mo. 02-12-06 Bx benign--Dr. Leda Quail  . FEMUR IM NAIL Left 08/27/2012   Procedure: INTRAMEDULLARY (IM) RETROGRADE FEMORAL NAILING;  Surgeon: Kathryne Hitch, MD;  Location: [facility] OR;  Service: Orthopedics;  Laterality: Left;  . HEEL SPUR SURGERY    . IM NAILING FEMORAL SHAFT FRACTURE Left 08/27/2012   Dr Magnus Ivan  . SHOULDER SURGERY Left   . TOTAL KNEE ARTHROPLASTY     x2  . TUBAL LIGATION    . VAGINAL HYSTERECTOMY     Allergies  Allergen Reactions  . Wellbutrin [Bupropion]     Anger issues  . Lyrica [Pregabalin] Swelling    Leg swelling   No current facility-administered medications on file prior to encounter.   Current Outpatient Medications on File  Prior to Encounter  Medication Sig Dispense Refill  . amLODipine (NORVASC) 2.5 MG tablet TAKE 1 TABLET BY MOUTH ONCE A DAY. 90 tablet 1  . amphetamine-dextroamphetamine (ADDERALL XR) 30 MG 24 hr capsule 2 qam 60 capsule 0  . amphetamine-dextroamphetamine (ADDERALL XR) 30 MG 24 hr capsule TAKE 2 CAPSULES BY MOUTH EACH MORNING. 60 capsule 0  . amphetamine-dextroamphetamine (ADDERALL XR) 30 MG 24 hr capsule TAKE 2 CAPSULES BY MOUTH EVERY MORNING. 60 capsule 0  . Diclofenac Sodium CR 100 MG 24 hr tablet TAKE 1 TABLET BY MOUTH TWICE DAILY. TAKE WITH FOOD. 180 tablet 1  . DULoxetine (CYMBALTA) 60 MG capsule Take 2 capsules (120 mg total) by mouth daily. 180 capsule 1  . lidocaine (XYLOCAINE) 2 % jelly Apply 1 application topically 2 (two) times daily. (Patient not taking: Reported on 08/23/2019) 4 mL 1  . lidocaine (XYLOCAINE) 2 % solution Take 5 mls BID prn canker sores 100 mL 1  . lidocaine (XYLOCAINE) 5 % ointment Apply small amount externally up two twice daily.  Do not use for more than 5 days in a row. 35.44 g 0  . nystatin (MYCOSTATIN/NYSTOP) powder May apply as needed several times per day 60 g 12  . omeprazole (PRILOSEC) 40 MG capsule TAKE (1) CAPSULE BY MOUTH ONCE DAILY. 90 capsule  1  . Oxybutynin Chloride 10 % GEL Apply daily to skin for overactive bladder 1 g 6  . oxyCODONE-acetaminophen (PERCOCET) 10-325 MG tablet Take 1 tablet by mouth every 6 (six) hours as needed for pain. Must last 30 days. 120 tablet 0  . [START ON 10/13/2019] oxyCODONE-acetaminophen (PERCOCET) 10-325 MG tablet Take 1 tablet by mouth every 6 (six) hours as needed for pain. Must last 30 days. 120 tablet 0  . [START ON 11/12/2019] oxyCODONE-acetaminophen (PERCOCET) 10-325 MG tablet Take 1 tablet by mouth every 6 (six) hours as needed for pain. Must last 30 days. 120 tablet 0  . traZODone (DESYREL) 100 MG tablet 100 mg at night as needed for sleep 90 tablet 1   Social History   Socioeconomic History  . Marital status:  Single    Spouse name: Not on file  . Number of children: Not on file  . Years of education: Not on file  . Highest education level: Not on file  Occupational History  . Not on file  Tobacco Use  . Smoking status: Former Smoker    Years: 10.00    Types: Cigarettes    Quit date: 08/16/2000    Years since quitting: 19.0  . Smokeless tobacco: Never Used  Vaping Use  . Vaping Use: Never used  Substance and Sexual Activity  . Alcohol use: Yes    Alcohol/week: 0.0 standard drinks    Comment: rarely  . Drug use: No  . Sexual activity: Not Currently    Partners: Female  Other Topics Concern  . Not on file  Social History Narrative  . Not on file   Social Determinants of Health   Financial Resource Strain:   . Difficulty of Paying Living Expenses:   Food Insecurity:   . Worried About Programme researcher, broadcasting/film/video in the Last Year:   . Barista in the Last Year:   Transportation Needs:   . Freight forwarder (Medical):   Marland Kitchen Lack of Transportation (Non-Medical):   Physical Activity:   . Days of Exercise per Week:   . Minutes of Exercise per Session:   Stress:   . Feeling of Stress :   Social Connections:   . Frequency of Communication with Friends and Family:   . Frequency of Social Gatherings with Friends and Family:   . Attends Religious Services:   . Active Member of Clubs or Organizations:   . Attends Banker Meetings:   Marland Kitchen Marital Status:   Intimate Partner Violence:   . Fear of Current or Ex-Partner:   . Emotionally Abused:   Marland Kitchen Physically Abused:   . Sexually Abused:    Family History  Problem Relation Age of Onset  . Cancer Mother   . Breast cancer Mother   . Bipolar disorder Mother   . Heart failure Father   . Cancer Father   . Cancer Other   . Heart failure Other   . Bipolar disorder Sister     OBJECTIVE:  Vitals:   09/14/19 1419 09/14/19 1421  BP:  121/75  Pulse:  69  Resp:  18  Temp:  98.7 F (37.1 C)  TempSrc:  Oral  SpO2:  95%    Weight: 232 lb (105.2 kg)   Height: 5\' 8"  (1.727 m)      Physical Exam Vitals and nursing note reviewed.  Constitutional:      General: She is not in acute distress.    Appearance: Normal appearance. She is  normal weight. She is not ill-appearing, toxic-appearing or diaphoretic.  Cardiovascular:     Rate and Rhythm: Normal rate and regular rhythm.     Pulses: Normal pulses.     Heart sounds: Normal heart sounds. No murmur heard.  No friction rub. No gallop.   Pulmonary:     Effort: Pulmonary effort is normal. No respiratory distress.     Breath sounds: No stridor. No wheezing, rhonchi or rales.  Chest:     Chest wall: No tenderness.  Skin:    General: Skin is warm.     Findings: Erythema and rash present.  Neurological:     Mental Status: She is alert.      LABS:  No results found for this or any previous visit (from the past 24 hour(s)).   ASSESSMENT & PLAN:  1. Bee sting reaction, accidental or unintentional, initial encounter   2. Rash due to allergy     Meds ordered this encounter  Medications  . methylPREDNISolone sodium succinate (SOLU-MEDROL) 125 mg/2 mL injection 80 mg  . predniSONE (STERAPRED UNI-PAK 21 TAB) 10 MG (21) TBPK tablet    Sig: Take 6 tabs by mouth daily  for 1 days, then 5 tabs for 1 days, then 4 tabs for 1 days, then 3 tabs for 1 days, 2 tabs for 1 days, then 1 tab by mouth daily for 1 days    Dispense:  21 tablet    Refill:  0  . cetirizine (ZYRTEC ALLERGY) 10 MG tablet    Sig: Take 1 tablet (10 mg total) by mouth daily.    Dispense:  30 tablet    Refill:  0   Patient is stable at discharge.  Zyrtec prednisone taper were prescribed for bee sting reaction.  Was advised to follow-up with PCP.  Discharge Instructions  Solu-Medrol IM was given in office Prescribed zyrtec and prednisone Take as prescribed and to completion Follow up with PCP if symptoms persists Return or go to the ER if you have any new or worsening symptoms     Reviewed expectations re: course of current medical issues. Questions answered. Outlined signs and symptoms indicating need for more acute intervention. Patient verbalized understanding. After Visit Summary given.      Note: This document was prepared using Dragon voice recognition software and may include unintentional dictation errors.    Durward Parcel, FNP 09/14/19 1456

## 2019-09-14 NOTE — Discharge Instructions (Addendum)
Solu-Medrol IM was given in office Prescribed zyrtec and prednisone Take as prescribed and to completion Follow up with PCP if symptoms persists Return or go to the ER if you have any new or worsening symptoms

## 2019-10-19 ENCOUNTER — Other Ambulatory Visit: Payer: Self-pay | Admitting: Family Medicine

## 2019-10-19 DIAGNOSIS — M25562 Pain in left knee: Secondary | ICD-10-CM | POA: Diagnosis not present

## 2019-10-19 DIAGNOSIS — M25561 Pain in right knee: Secondary | ICD-10-CM | POA: Diagnosis not present

## 2019-10-27 DIAGNOSIS — M79674 Pain in right toe(s): Secondary | ICD-10-CM | POA: Diagnosis not present

## 2019-10-27 DIAGNOSIS — M2041 Other hammer toe(s) (acquired), right foot: Secondary | ICD-10-CM | POA: Diagnosis not present

## 2019-10-27 DIAGNOSIS — M25571 Pain in right ankle and joints of right foot: Secondary | ICD-10-CM | POA: Diagnosis not present

## 2019-11-09 DIAGNOSIS — M7731 Calcaneal spur, right foot: Secondary | ICD-10-CM | POA: Diagnosis not present

## 2019-11-09 DIAGNOSIS — M25571 Pain in right ankle and joints of right foot: Secondary | ICD-10-CM | POA: Diagnosis not present

## 2019-11-09 DIAGNOSIS — M19071 Primary osteoarthritis, right ankle and foot: Secondary | ICD-10-CM | POA: Diagnosis not present

## 2019-11-23 ENCOUNTER — Encounter: Payer: Self-pay | Admitting: Student in an Organized Health Care Education/Training Program

## 2019-11-23 ENCOUNTER — Ambulatory Visit
Payer: PPO | Attending: Student in an Organized Health Care Education/Training Program | Admitting: Student in an Organized Health Care Education/Training Program

## 2019-11-23 ENCOUNTER — Other Ambulatory Visit: Payer: Self-pay

## 2019-11-23 VITALS — BP 159/97 | HR 75 | Temp 97.1°F | Resp 18 | Ht 68.0 in | Wt 230.0 lb

## 2019-11-23 DIAGNOSIS — G8929 Other chronic pain: Secondary | ICD-10-CM | POA: Diagnosis not present

## 2019-11-23 DIAGNOSIS — M19071 Primary osteoarthritis, right ankle and foot: Secondary | ICD-10-CM | POA: Insufficient documentation

## 2019-11-23 DIAGNOSIS — M48062 Spinal stenosis, lumbar region with neurogenic claudication: Secondary | ICD-10-CM | POA: Diagnosis not present

## 2019-11-23 DIAGNOSIS — M47816 Spondylosis without myelopathy or radiculopathy, lumbar region: Secondary | ICD-10-CM | POA: Insufficient documentation

## 2019-11-23 DIAGNOSIS — M5416 Radiculopathy, lumbar region: Secondary | ICD-10-CM | POA: Insufficient documentation

## 2019-11-23 DIAGNOSIS — Z79891 Long term (current) use of opiate analgesic: Secondary | ICD-10-CM | POA: Diagnosis not present

## 2019-11-23 DIAGNOSIS — M5136 Other intervertebral disc degeneration, lumbar region: Secondary | ICD-10-CM | POA: Diagnosis not present

## 2019-11-23 DIAGNOSIS — G894 Chronic pain syndrome: Secondary | ICD-10-CM | POA: Insufficient documentation

## 2019-11-23 HISTORY — DX: Primary osteoarthritis, right ankle and foot: M19.071

## 2019-11-23 MED ORDER — OXYCODONE-ACETAMINOPHEN 10-325 MG PO TABS: 1.0000 | ORAL_TABLET | Freq: Four times a day (QID) | ORAL | 0 refills | Status: DC | PRN

## 2019-11-23 NOTE — Progress Notes (Signed)
Nursing Pain Medication Assessment:  Safety precautions to be maintained throughout the outpatient stay will include: orient to surroundings, keep bed in low position, maintain call bell within reach at all times, provide assistance with transfer out of bed and ambulation.  Medication Inspection Compliance: Pill count conducted under aseptic conditions, in front of the patient. Neither the pills nor the bottle was removed from the patient's sight at any time. Once count was completed pills were immediately returned to the patient in their original bottle.  Medication: Oxycodone/APAP Pill/Patch Count: 120 of 120 pills remain Pill/Patch Appearance: Markings consistent with prescribed medication Bottle Appearance: Standard pharmacy container. Clearly labeled. Filled Date: 09 / 04 / 2021 Last Medication intake:  Today

## 2019-11-23 NOTE — Progress Notes (Signed)
PROVIDER NOTE: Information contained herein reflects review and annotations entered in association with encounter. Interpretation of such information and data should be left to medically-trained personnel. Information provided to patient can be located elsewhere in the medical record under "Patient Instructions". Document created using STT-dictation technology, any transcriptional errors that may result from process are unintentional.    Patient: Lauren Greene  Service Category: E/M  Provider: Gillis Santa, MD  DOB: 09/13/1965  DOS: 11/23/2019  Specialty: Interventional Pain Management  MRN: [id number]  Setting: Ambulatory outpatient  PCP: Kathyrn Drown, MD  Type: Established Patient    Referring Provider: Kathyrn Drown, MD  Location: Office  Delivery: Face-to-face     HPI  Reason for encounter: [name], a 54 y.o. year old female, is here today for evaluation and management of her Chronic pain syndrome [G89.4]. [name] primary complain today is Knee Pain (right), Foot Pain (right), and Back Pain (low) Last encounter: Practice (08/23/2019). My last encounter with her was on 08/23/2019. Pertinent problems: 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 Pain Assessment: Severity of Chronic pain is reported as a 8 /10. Location: Foot Right/denies. Onset: More than a month ago. Quality: Aching. Timing: Constant. Modifying factor(s): medication. Vitals:  height is _0  (1.727 m) and weight is 230 lb (104.3 kg). Her temperature is 97.1 F (36.2 C) (abnormal). Her blood pressure is 159/97 (abnormal) and her pulse is 75. Her respiration is 18 and oxygen saturation is 99%.   Persistent right ankle, right  knee and low back pain.  Patient has been evaluated by orthopedics and right ankle/foot surgery has been recommended.  She will have this in the next couple of months. Otherwise we discussed medication alternatives.  I did provide the patient the opportunity to trial buprenorphine for long-acting pain relief as well as tramadol ER.  She was not interested in these medications.  She takes Percocet 20 mg twice daily for pain management.  Current MME is 60.  No further dose escalation beyond this.  Can consider alternatives such as buprenorphine, tramadol ER after her right ankle/foot surgery.  Patient has been utilizing Robaxin as needed.  She takes this medication infrequently, only when she has spasms and musculoskeletal pain that is severe.  Pharmacotherapy Assessment     11/20/2019  1   08/23/2019  Oxycodone-Acetaminophen 10-325  120.00  30 Bi Lat   [id number]   Car (9744)   0/0  60.00 MME  Comm Ins   [place]     Analgesic: Percocet 10 mg 4 times daily as needed, quantity 120/month MME equals 60   Monitoring: [place] PMP: PDMP not reviewed this encounter.       Pharmacotherapy: No side-effects or adverse reactions reported. Compliance: No problems identified. Effectiveness: Clinically acceptable.  Dewayne Shorter, RN  11/23/2019  1:31 PM  Signed Nursing Pain Medication Assessment:  Safety precautions to be maintained throughout the outpatient stay will include: orient to surroundings, keep bed in low position, maintain call bell within reach at all times, provide assistance with transfer out of bed and ambulation.  Medication Inspection Compliance: Pill count conducted under aseptic conditions, in front of the patient. Neither the pills nor the bottle was removed from the patient's sight at any time. Once count was completed  pills were immediately returned to the patient in their original bottle.  Medication: Oxycodone/APAP Pill/Patch Count: 120 of 120 pills remain Pill/Patch Appearance: Markings  consistent with prescribed medication Bottle Appearance: Standard pharmacy container. Clearly labeled. Filled Date: 09 / 04 / 2021 Last Medication intake:  Today    UDS:  Summary  Date Value Ref Range Status  08/23/2019 Note  Final    Comment:    ==================================================================== [employer] [facility] (MW) ==================================================================== Test                             Result       Flag       Units  Drug Present and Declared for Prescription Verification   Amphetamine                    >2890        EXPECTED   ng/mg creat    Amphetamine is available as a schedule II prescription drug.    Oxycodone                      >2890        EXPECTED   ng/mg creat   Oxymorphone                    >2890        EXPECTED   ng/mg creat   Noroxycodone                   >2890        EXPECTED   ng/mg creat   Noroxymorphone                 631          EXPECTED   ng/mg creat    Sources of oxycodone are scheduled prescription medications.    Oxymorphone, noroxycodone, and noroxymorphone are expected    metabolites of oxycodone. Oxymorphone is also available as a    scheduled prescription medication.  ==================================================================== Test                      Result    Flag   Units      Ref Range   Creatinine              346              mg/dL      >=20 ==================================================================== Declared Medications:  The flagging and interpretation on this report are based on the  following declared medications.  Unexpected results may arise from  inaccuracies in the declared medications.   **Note: The testing scope of this panel includes these medications:   Amphetamine (Adderall)  Oxycodone (Percocet)   **Note: The testing scope of this panel does not include the  following reported medications:   Acetaminophen (Percocet)  Amlodipine (Norvasc)   Diclofenac  Duloxetine (Cymbalta)  Lidocaine (Xylocaine)  Nystatin (Mycostatin)  Omeprazole (Prilosec)  Oxybutynin  Trazodone (Desyrel) ==================================================================== For clinical consultation, please call [phone]. ====================================================================      ROS  Constitutional: Denies any fever or chills Gastrointestinal: No reported hemesis, hematochezia, vomiting, or acute GI distress Musculoskeletal: Low back, right knee, right ankle pain Neurological: No reported episodes of acute onset apraxia, aphasia, dysarthria, agnosia, amnesia, paralysis, loss of coordination, or loss of consciousness  Medication Review  DULoxetine, Diclofenac Sodium CR, Oxybutynin Chloride, amLODipine, amphetamine-dextroamphetamine, cetirizine, lidocaine, methocarbamol,  nystatin, omeprazole, oxyCODONE-acetaminophen, and traZODone  History Review  Allergy: Ms. Florendo is allergic to wellbutrin [bupropion] and lyrica [pregabalin]. Drug: Ms. Marcano  reports no history of drug use. Alcohol:  reports current alcohol use. Tobacco:  reports that she quit smoking about 19 years ago. Her smoking use included cigarettes. She quit after 10.00 years of use. She has never used smokeless tobacco. Social: Ms. Satchell  reports that she quit smoking about 19 years ago. Her smoking use included cigarettes. She quit after 10.00 years of use. She has never used smokeless tobacco. She reports current alcohol use. She reports that she does not use drugs. Medical:  has a past medical history of ADHD (attention deficit hyperactivity disorder), Arthritis, Back pain, Broken femur ([place]) (08/27/12), Migraine, Osteoarthritis, Raynaud disease, and Raynaud phenomenon (04/06/2018). Surgical: Ms. Rorrer  has a past surgical history that includes Total knee arthroplasty; Heel spur surgery; Back surgery; IM nailing femoral shaft fracture (Left, 08/27/2012); Femur IM  nail (Left, 08/27/2012); Cervical biopsy w/ loop electrode excision (1990's); Endometrial biopsy (04-05-05 and 02-12-06); Tubal ligation; Shoulder surgery (Left); and Vaginal hysterectomy. Family: family history includes Bipolar disorder in her mother and sister; Breast cancer in her mother; Cancer in her father, mother, and another family member; Heart failure in her father and another family member.  Laboratory Chemistry Profile   Renal Lab Results  Component Value Date   BUN 13 08/30/2019   CREATININE 0.66 08/30/2019   BCR 20 08/30/2019   GFRAA 115 08/30/2019   GFRNONAA 100 08/30/2019     Hepatic Lab Results  Component Value Date   AST 26 03/15/2017   ALT 26 03/15/2017   ALBUMIN 4.3 03/15/2017   ALKPHOS 117 03/15/2017   LIPASE 25 01/12/2016     Electrolytes Lab Results  Component Value Date   NA 141 08/30/2019   K 5.2 08/30/2019   CL 105 08/30/2019   CALCIUM 9.2 08/30/2019     Bone Lab Results  Component Value Date   VD25OH 31 01/07/2013     Inflammation (CRP: Acute Phase) (ESR: Chronic Phase) Lab Results  Component Value Date   CRP 1.7 08/03/2014   ESRSEDRATE 4 04/06/2018       Note: Above Lab results reviewed.  Recent Imaging Review  MM 3D SCREEN BREAST BILATERAL CLINICAL DATA:  Screening.  EXAM: DIGITAL SCREENING BILATERAL MAMMOGRAM WITH TOMO AND CAD  COMPARISON:  Previous exam(s).  ACR Breast Density Category b: There are scattered areas of fibroglandular density.  FINDINGS: There are no findings suspicious for malignancy. Images were processed with CAD.  IMPRESSION: No mammographic evidence of malignancy. A result letter of this screening mammogram will be mailed directly to the patient.  RECOMMENDATION: Screening mammogram in one year. (Code:SM-B-01Y)  BI-RADS CATEGORY  1: Negative.  Electronically Signed   By: Lovey Newcomer M.D.   On: 01/01/2019 12:50 Note: Reviewed        Physical Exam  General appearance: Well nourished, well  developed, and well hydrated. In no apparent acute distress Mental status: Alert, oriented x 3 (person, place, & time)       Respiratory: No evidence of acute respiratory distress Eyes: PERLA Vitals: BP (!) 159/97   Pulse 75   Temp (!) 97.1 F (36.2 C)   Resp 18   Ht _0  (1.727 m)   Wt 230 lb (104.3 kg)   LMP 03/18/2004   SpO2 99%   BMI 34.97 kg/m  BMI: Estimated body mass index is 34.97 kg/m as calculated from the  following:   Height as of this encounter: _0  (1.727 m).   Weight as of this encounter: 230 lb (104.3 kg). Ideal: Ideal body weight: 63.9 kg (140 lb 14 oz) Adjusted ideal body weight: 80.1 kg (176 lb 8.4 oz)  Lumbar Spine Area Exam  Skin & Axial Inspection: No masses, redness, or swelling Alignment: Symmetrical Functional ROM: Pain restricted ROM affecting both sides Stability: No instability detected Muscle Tone/Strength: Functionally intact. No obvious neuro-muscular anomalies detected. Sensory (Neurological): Musculoskeletal pain pattern  Lower Extremity Exam    Side: Right lower extremity  Side: Left lower extremity  Stability:  Right ankle eversion         Stability: No instability observed          Skin & Extremity Inspection: Skin color, temperature, and hair growth are WNL. No peripheral edema or cyanosis. No masses, redness, swelling, asymmetry, or associated skin lesions. No contractures.  Skin & Extremity Inspection: Skin color, temperature, and hair growth are WNL. No peripheral edema or cyanosis. No masses, redness, swelling, asymmetry, or associated skin lesions. No contractures.  Functional ROM: Pain restricted ROM for hip and knee joints, right ankle pain with right ankle eversion.          Functional ROM: Pain restricted ROM for hip and knee joints          Muscle Tone/Strength: Functionally intact. No obvious neuro-muscular anomalies detected.  Muscle Tone/Strength: Functionally intact. No obvious neuro-muscular anomalies detected.   Sensory (Neurological): Arthropathic arthralgia        Sensory (Neurological): Arthropathic arthralgia        DTR: Patellar: deferred today Achilles: deferred today Plantar: deferred today  DTR: Patellar: deferred today Achilles: deferred today Plantar: deferred today  Palpation: No palpable anomalies  Palpation: No palpable anomalies     Assessment   Status Diagnosis  Controlled Controlled Controlled 1. Chronic pain syndrome   2. Primary osteoarthritis of right ankle   3. Lumbar radiculopathy   4. Chronic radicular lumbar pain   5. Lumbar facet arthropathy   6. Lumbar spondylosis   7. Spinal stenosis, lumbar region, with neurogenic claudication   8. Long term current use of opiate analgesic   9. Encounter for long-term opiate analgesic use   10. Lumbar degenerative disc disease      Updated Problems: Problem  Encounter for Long-Term Opiate Analgesic Use  Primary Osteoarthritis of Right Ankle    Plan of Care  Ms. MANJIT BUFANO has a current medication list which includes the following long-term medication(s): amlodipine, amphetamine-dextroamphetamine, amphetamine-dextroamphetamine, amphetamine-dextroamphetamine, cetirizine, duloxetine, omeprazole, and trazodone.  Pharmacotherapy (Medications Ordered): Meds ordered this encounter  Medications  . oxyCODONE-acetaminophen (PERCOCET) 10-325 MG tablet    Sig: Take 1 tablet by mouth every 6 (six) hours as needed for pain. Must last 30 days.    Dispense:  120 tablet    Refill:  0    [place] STOP ACT - Not applicable. Fill one day early if pharmacy is closed on scheduled refill date.  Marland Kitchen oxyCODONE-acetaminophen (PERCOCET) 10-325 MG tablet    Sig: Take 1 tablet by mouth every 6 (six) hours as needed for pain. Must last 30 days.    Dispense:  120 tablet    Refill:  0    [place] STOP ACT - Not applicable. Fill one day early if pharmacy is closed on scheduled refill date.  Marland Kitchen oxyCODONE-acetaminophen (PERCOCET) 10-325 MG tablet     Sig: Take 1 tablet by mouth every 6 (six) hours as needed for  pain. Must last 30 days.    Dispense:  120 tablet    Refill:  0    [place] STOP ACT - Not applicable. Fill one day early if pharmacy is closed on scheduled refill date.   Continue Robaxin as needed, lidocaine appointment, Cymbalta, diclofenac oral.  Follow-up plan:   Return in about 3 months (around 03/06/2020) for Medication Management, in person.   Recent Visits No visits were found meeting these conditions. Showing recent visits within past 90 days and meeting all other requirements Today's Visits Date Type Provider Dept  11/23/19 Office Visit Gillis Santa, MD [facility]  Showing today's visits and meeting all other requirements Future Appointments No visits were found meeting these conditions. Showing future appointments within next 90 days and meeting all other requirements  I discussed the assessment and treatment plan with the patient. The patient was provided an opportunity to ask questions and all were answered. The patient agreed with the plan and demonstrated an understanding of the instructions.  Patient advised to call back or seek an in-person evaluation if the symptoms or condition worsens.  Duration of encounter: 7mnutes.  Note by: BGillis Santa MD Date: 11/23/2019; Time: 3:22 PM

## 2019-11-30 ENCOUNTER — Ambulatory Visit (INDEPENDENT_AMBULATORY_CARE_PROVIDER_SITE_OTHER): Payer: PPO | Admitting: Family Medicine

## 2019-11-30 ENCOUNTER — Other Ambulatory Visit: Payer: Self-pay

## 2019-11-30 ENCOUNTER — Encounter: Payer: Self-pay | Admitting: Family Medicine

## 2019-11-30 VITALS — BP 124/78 | HR 75 | Temp 97.2°F | Wt 239.8 lb

## 2019-11-30 DIAGNOSIS — F988 Other specified behavioral and emotional disorders with onset usually occurring in childhood and adolescence: Secondary | ICD-10-CM | POA: Diagnosis not present

## 2019-11-30 MED ORDER — AMPHETAMINE-DEXTROAMPHET ER 30 MG PO CP24: ORAL_CAPSULE | ORAL | 0 refills | Status: DC

## 2019-11-30 NOTE — Progress Notes (Signed)
   Subjective:    Patient ID: Lauren Greene, female    DOB: [birth year], 54 y.o.   MRN: [id number]  HPI Patient was seen today for ADD checkup.  This patient does have ADD.  Patient takes medications for this.  If this does help control overall symptoms.  Please see below. -weight, vital signs reviewed. Patient states medication helps her stay focused.  She denies it causing health problems with her.  Denies abusing it. The following items were covered. -Compliance with medication : Adderall XR 30 mg 2 each morning  -Problems with completing homework, paying attention/taking good notes in school: n/a  -grades: n/a  - Eating patterns : eating well  -sleeping: sleeping well   -Additional issues or questions: none       Objective:   Physical Exam Vitals reviewed.  Constitutional:      General: She is not in acute distress. HENT:     Head: Normocephalic and atraumatic.  Eyes:     General:        Right eye: No discharge.        Left eye: No discharge.  Neck:     Trachea: No tracheal deviation.  Cardiovascular:     Rate and Rhythm: Normal rate and regular rhythm.     Heart sounds: Normal heart sounds. No murmur heard.   Pulmonary:     Effort: Pulmonary effort is normal. No respiratory distress.     Breath sounds: Normal breath sounds.  Lymphadenopathy:     Cervical: No cervical adenopathy.  Skin:    General: Skin is warm and dry.  Neurological:     Mental Status: She is alert.     Coordination: Coordination normal.  Psychiatric:        Behavior: Behavior normal.           Assessment & Plan:  Adult ADD Medicine does help her stay focused and on target so she is able to get things done to be more complex.  She may continue the medication 3 scripts were sent in do a virtual visit in 3 months

## 2019-12-15 DIAGNOSIS — Z029 Encounter for administrative examinations, unspecified: Secondary | ICD-10-CM

## 2019-12-20 ENCOUNTER — Other Ambulatory Visit: Payer: Self-pay | Admitting: Family Medicine

## 2020-01-19 DIAGNOSIS — M79671 Pain in right foot: Secondary | ICD-10-CM | POA: Diagnosis not present

## 2020-01-19 DIAGNOSIS — M19071 Primary osteoarthritis, right ankle and foot: Secondary | ICD-10-CM | POA: Diagnosis not present

## 2020-01-20 ENCOUNTER — Other Ambulatory Visit: Payer: Self-pay | Admitting: Orthopaedic Surgery

## 2020-01-26 ENCOUNTER — Encounter (HOSPITAL_BASED_OUTPATIENT_CLINIC_OR_DEPARTMENT_OTHER): Payer: Self-pay | Admitting: Orthopaedic Surgery

## 2020-01-26 ENCOUNTER — Other Ambulatory Visit: Payer: Self-pay

## 2020-01-28 ENCOUNTER — Other Ambulatory Visit (HOSPITAL_COMMUNITY)
Admission: RE | Admit: 2020-01-28 | Discharge: 2020-01-28 | Disposition: A | Discharge status: Home / Self Care | Payer: PPO | Source: Ambulatory Visit | Attending: Orthopaedic Surgery | Admitting: Orthopaedic Surgery

## 2020-01-28 DIAGNOSIS — Z20822 Contact with and (suspected) exposure to covid-19: Secondary | ICD-10-CM | POA: Insufficient documentation

## 2020-01-28 DIAGNOSIS — Z01812 Encounter for preprocedural laboratory examination: Secondary | ICD-10-CM | POA: Diagnosis not present

## 2020-01-28 LAB — SARS CORONAVIRUS 2 (TAT 6-24 HRS): SARS Coronavirus 2: NEGATIVE

## 2020-01-28 NOTE — Progress Notes (Signed)

## 2020-01-31 NOTE — Anesthesia Preprocedure Evaluation (Addendum)
Anesthesia Evaluation  Patient identified by MRN, date of birth, ID band Patient awake    Reviewed: Allergy & Precautions, NPO status , Patient's Chart, lab work & pertinent test results  Airway Mallampati: II       Dental no notable dental hx.    Pulmonary former smoker,    Pulmonary exam normal        Cardiovascular hypertension, Pt. on medications Normal cardiovascular exam     Neuro/Psych  Headaches, PSYCHIATRIC DISORDERS Depression  Neuromuscular disease    GI/Hepatic Neg liver ROS, GERD  Medicated and Controlled,  Endo/Other  negative endocrine ROS  Renal/GU negative Renal ROS  negative genitourinary   Musculoskeletal  (+) Arthritis , Osteoarthritis,    Abdominal (+) + obese,   Peds  Hematology negative hematology ROS (+)   Anesthesia Other Findings   Reproductive/Obstetrics                            Anesthesia Physical Anesthesia Plan  ASA: II  Anesthesia Plan: General   Post-op Pain Management:  Regional for Post-op pain   Induction: Intravenous  PONV Risk Score and Plan: 3 and Ondansetron, Dexamethasone and Midazolam  Airway Management Planned: LMA  Additional Equipment: None  Intra-op Plan:   Post-operative Plan: Extubation in OR  Informed Consent: I have reviewed the patients History and Physical, chart, labs and discussed the procedure including the risks, benefits and alternatives for the proposed anesthesia with the patient or authorized representative who has indicated his/her understanding and acceptance.     Dental advisory given  Plan Discussed with: CRNA  Anesthesia Plan Comments:        Anesthesia Quick Evaluation

## 2020-02-01 ENCOUNTER — Ambulatory Visit (HOSPITAL_BASED_OUTPATIENT_CLINIC_OR_DEPARTMENT_OTHER): Payer: PPO | Admitting: Anesthesiology

## 2020-02-01 ENCOUNTER — Ambulatory Visit (HOSPITAL_BASED_OUTPATIENT_CLINIC_OR_DEPARTMENT_OTHER)
Admission: RE | Admit: 2020-02-01 | Discharge: 2020-02-01 | Disposition: A | Discharge status: Home / Self Care | Payer: PPO | Attending: Orthopaedic Surgery | Admitting: Orthopaedic Surgery

## 2020-02-01 ENCOUNTER — Encounter (HOSPITAL_BASED_OUTPATIENT_CLINIC_OR_DEPARTMENT_OTHER): Payer: Self-pay | Admitting: Orthopaedic Surgery

## 2020-02-01 ENCOUNTER — Ambulatory Visit (HOSPITAL_COMMUNITY): Payer: PPO

## 2020-02-01 ENCOUNTER — Other Ambulatory Visit: Payer: Self-pay

## 2020-02-01 ENCOUNTER — Encounter (HOSPITAL_BASED_OUTPATIENT_CLINIC_OR_DEPARTMENT_OTHER)
Admission: RE | Disposition: A | Discharge status: Home / Self Care | Payer: Self-pay | Source: Home / Self Care | Attending: Orthopaedic Surgery

## 2020-02-01 DIAGNOSIS — Z888 Allergy status to other drugs, medicaments and biological substances status: Secondary | ICD-10-CM | POA: Insufficient documentation

## 2020-02-01 DIAGNOSIS — G8918 Other acute postprocedural pain: Secondary | ICD-10-CM | POA: Diagnosis not present

## 2020-02-01 DIAGNOSIS — Z6837 Body mass index (BMI) 37.0-37.9, adult: Secondary | ICD-10-CM | POA: Diagnosis not present

## 2020-02-01 DIAGNOSIS — S92901D Unspecified fracture of right foot, subsequent encounter for fracture with routine healing: Secondary | ICD-10-CM | POA: Diagnosis not present

## 2020-02-01 DIAGNOSIS — Z79899 Other long term (current) drug therapy: Secondary | ICD-10-CM | POA: Insufficient documentation

## 2020-02-01 DIAGNOSIS — F909 Attention-deficit hyperactivity disorder, unspecified type: Secondary | ICD-10-CM | POA: Diagnosis not present

## 2020-02-01 DIAGNOSIS — K219 Gastro-esophageal reflux disease without esophagitis: Secondary | ICD-10-CM | POA: Insufficient documentation

## 2020-02-01 DIAGNOSIS — M24574 Contracture, right foot: Secondary | ICD-10-CM | POA: Diagnosis not present

## 2020-02-01 DIAGNOSIS — G8929 Other chronic pain: Secondary | ICD-10-CM | POA: Diagnosis not present

## 2020-02-01 DIAGNOSIS — M2141 Flat foot [pes planus] (acquired), right foot: Secondary | ICD-10-CM | POA: Insufficient documentation

## 2020-02-01 DIAGNOSIS — Z87891 Personal history of nicotine dependence: Secondary | ICD-10-CM | POA: Diagnosis not present

## 2020-02-01 DIAGNOSIS — M19071 Primary osteoarthritis, right ankle and foot: Secondary | ICD-10-CM | POA: Diagnosis not present

## 2020-02-01 DIAGNOSIS — M6701 Short Achilles tendon (acquired), right ankle: Secondary | ICD-10-CM | POA: Diagnosis not present

## 2020-02-01 DIAGNOSIS — M76821 Posterior tibial tendinitis, right leg: Secondary | ICD-10-CM | POA: Diagnosis not present

## 2020-02-01 DIAGNOSIS — Z8249 Family history of ischemic heart disease and other diseases of the circulatory system: Secondary | ICD-10-CM | POA: Diagnosis not present

## 2020-02-01 DIAGNOSIS — Z809 Family history of malignant neoplasm, unspecified: Secondary | ICD-10-CM | POA: Diagnosis not present

## 2020-02-01 DIAGNOSIS — G43909 Migraine, unspecified, not intractable, without status migrainosus: Secondary | ICD-10-CM | POA: Insufficient documentation

## 2020-02-01 DIAGNOSIS — Z419 Encounter for procedure for purposes other than remedying health state, unspecified: Secondary | ICD-10-CM

## 2020-02-01 DIAGNOSIS — Z803 Family history of malignant neoplasm of breast: Secondary | ICD-10-CM | POA: Insufficient documentation

## 2020-02-01 DIAGNOSIS — Q740 Other congenital malformations of upper limb(s), including shoulder girdle: Secondary | ICD-10-CM | POA: Diagnosis not present

## 2020-02-01 DIAGNOSIS — Z818 Family history of other mental and behavioral disorders: Secondary | ICD-10-CM | POA: Diagnosis not present

## 2020-02-01 DIAGNOSIS — I10 Essential (primary) hypertension: Secondary | ICD-10-CM | POA: Insufficient documentation

## 2020-02-01 DIAGNOSIS — I7300 Raynaud's syndrome without gangrene: Secondary | ICD-10-CM | POA: Insufficient documentation

## 2020-02-01 DIAGNOSIS — E669 Obesity, unspecified: Secondary | ICD-10-CM | POA: Insufficient documentation

## 2020-02-01 HISTORY — DX: Gastro-esophageal reflux disease without esophagitis: K21.9

## 2020-02-01 HISTORY — PX: ACHILLES TENDON LENGTHENING: SHX6455

## 2020-02-01 HISTORY — PX: FOOT ARTHRODESIS: SHX1655

## 2020-02-01 HISTORY — DX: Other chronic pain: G89.29

## 2020-02-01 SURGERY — FUSION, JOINT, FOOT
Anesthesia: General | Site: Foot | Laterality: Right

## 2020-02-01 MED ORDER — LIDOCAINE HCL (CARDIAC) PF 100 MG/5ML IV SOSY
PREFILLED_SYRINGE | INTRAVENOUS | Status: DC | PRN
  Administered 2020-02-01: 100 mg via INTRAVENOUS

## 2020-02-01 MED ORDER — ENOXAPARIN SODIUM 40 MG/0.4ML ~~LOC~~ SOLN: 40.0000 mg | SUBCUTANEOUS | 0 refills | Status: DC

## 2020-02-01 MED ORDER — MIDAZOLAM HCL 2 MG/2ML IJ SOLN
INTRAMUSCULAR | Status: DC | PRN
  Administered 2020-02-01: 2 mg via INTRAVENOUS

## 2020-02-01 MED ORDER — DEXAMETHASONE SODIUM PHOSPHATE 10 MG/ML IJ SOLN
INTRAMUSCULAR | Status: AC
  Filled 2020-02-01: qty 1

## 2020-02-01 MED ORDER — PHENYLEPHRINE HCL (PRESSORS) 10 MG/ML IV SOLN
INTRAVENOUS | Status: AC
  Filled 2020-02-01: qty 1

## 2020-02-01 MED ORDER — MIDAZOLAM HCL 2 MG/2ML IJ SOLN
INTRAMUSCULAR | Status: AC
  Filled 2020-02-01: qty 2

## 2020-02-01 MED ORDER — FENTANYL CITRATE (PF) 100 MCG/2ML IJ SOLN
INTRAMUSCULAR | Status: AC
  Filled 2020-02-01: qty 2

## 2020-02-01 MED ORDER — PHENYLEPHRINE HCL-NACL 10-0.9 MG/250ML-% IV SOLN
INTRAVENOUS | Status: DC | PRN
  Administered 2020-02-01: 25 ug/min via INTRAVENOUS

## 2020-02-01 MED ORDER — ACETAMINOPHEN 10 MG/ML IV SOLN
INTRAVENOUS | Status: AC
  Filled 2020-02-01: qty 100

## 2020-02-01 MED ORDER — ONDANSETRON HCL 4 MG/2ML IJ SOLN: 4.0000 mg | Freq: Once | INTRAMUSCULAR | Status: DC | PRN

## 2020-02-01 MED ORDER — FENTANYL CITRATE (PF) 100 MCG/2ML IJ SOLN
25.0000 ug | INTRAMUSCULAR | Status: DC | PRN
  Administered 2020-02-01 (×3): 50 ug via INTRAVENOUS

## 2020-02-01 MED ORDER — DEXAMETHASONE SODIUM PHOSPHATE 10 MG/ML IJ SOLN
INTRAMUSCULAR | Status: DC | PRN
  Administered 2020-02-01: 10 mg via INTRAVENOUS

## 2020-02-01 MED ORDER — DIPHENHYDRAMINE HCL 50 MG/ML IJ SOLN
INTRAMUSCULAR | Status: DC | PRN
  Administered 2020-02-01: 12.5 mg via INTRAVENOUS

## 2020-02-01 MED ORDER — GABAPENTIN 300 MG PO CAPS
ORAL_CAPSULE | ORAL | Status: AC
  Filled 2020-02-01: qty 1

## 2020-02-01 MED ORDER — GLYCOPYRROLATE 0.2 MG/ML IJ SOLN
INTRAMUSCULAR | Status: DC | PRN
  Administered 2020-02-01: .2 mg via INTRAVENOUS

## 2020-02-01 MED ORDER — ASPIRIN 325 MG PO TABS: 325.0000 mg | ORAL_TABLET | Freq: Every day | ORAL | 0 refills | Status: DC

## 2020-02-01 MED ORDER — MEPERIDINE HCL 25 MG/ML IJ SOLN: 6.2500 mg | INTRAMUSCULAR | Status: DC | PRN

## 2020-02-01 MED ORDER — OXYCODONE HCL 5 MG PO TABS
5.0000 mg | ORAL_TABLET | Freq: Once | ORAL | Status: AC | PRN
  Administered 2020-02-01: 5 mg via ORAL

## 2020-02-01 MED ORDER — LIDOCAINE 2% (20 MG/ML) 5 ML SYRINGE
INTRAMUSCULAR | Status: AC
  Filled 2020-02-01: qty 5

## 2020-02-01 MED ORDER — DEXMEDETOMIDINE (PRECEDEX) IN NS 20 MCG/5ML (4 MCG/ML) IV SYRINGE
PREFILLED_SYRINGE | INTRAVENOUS | Status: AC
  Filled 2020-02-01: qty 10

## 2020-02-01 MED ORDER — FENTANYL CITRATE (PF) 100 MCG/2ML IJ SOLN
INTRAMUSCULAR | Status: DC | PRN
  Administered 2020-02-01 (×3): 50 ug via INTRAVENOUS

## 2020-02-01 MED ORDER — MIDAZOLAM HCL 2 MG/2ML IJ SOLN
1.0000 mg | Freq: Once | INTRAMUSCULAR | Status: AC
  Administered 2020-02-01: 2 mg via INTRAVENOUS

## 2020-02-01 MED ORDER — ONDANSETRON HCL 4 MG/2ML IJ SOLN
INTRAMUSCULAR | Status: DC | PRN
  Administered 2020-02-01: 4 mg via INTRAVENOUS

## 2020-02-01 MED ORDER — PROPOFOL 10 MG/ML IV BOLUS
INTRAVENOUS | Status: AC
  Filled 2020-02-01: qty 20

## 2020-02-01 MED ORDER — ROPIVACAINE HCL 7.5 MG/ML IJ SOLN
INTRAMUSCULAR | Status: DC | PRN
  Administered 2020-02-01 (×8): 5 mL via PERINEURAL

## 2020-02-01 MED ORDER — SUCCINYLCHOLINE CHLORIDE 200 MG/10ML IV SOSY
PREFILLED_SYRINGE | INTRAVENOUS | Status: AC
  Filled 2020-02-01: qty 10

## 2020-02-01 MED ORDER — ACETAMINOPHEN 160 MG/5ML PO SOLN: 325.0000 mg | ORAL | Status: DC | PRN

## 2020-02-01 MED ORDER — OXYCODONE HCL 5 MG PO TABS
5.0000 mg | ORAL_TABLET | ORAL | 0 refills | Status: AC | PRN
Start: 2020-02-01 — End: 2020-02-08

## 2020-02-01 MED ORDER — PHENYLEPHRINE HCL (PRESSORS) 10 MG/ML IV SOLN
INTRAVENOUS | Status: DC | PRN
  Administered 2020-02-01: 40 ug via INTRAVENOUS

## 2020-02-01 MED ORDER — DIPHENHYDRAMINE HCL 50 MG/ML IJ SOLN
INTRAMUSCULAR | Status: AC
  Filled 2020-02-01: qty 1

## 2020-02-01 MED ORDER — EPHEDRINE SULFATE 50 MG/ML IJ SOLN
INTRAMUSCULAR | Status: DC | PRN
  Administered 2020-02-01: 10 mg via INTRAVENOUS
  Administered 2020-02-01: 15 mg via INTRAVENOUS
  Administered 2020-02-01: 10 mg via INTRAVENOUS

## 2020-02-01 MED ORDER — EPHEDRINE 5 MG/ML INJ
INTRAVENOUS | Status: AC
  Filled 2020-02-01: qty 10

## 2020-02-01 MED ORDER — CLONIDINE HCL (ANALGESIA) 100 MCG/ML EP SOLN
EPIDURAL | Status: DC | PRN
  Administered 2020-02-01: 50 ug

## 2020-02-01 MED ORDER — MIDAZOLAM HCL 2 MG/2ML IJ SOLN: 2.0000 mg | Freq: Once | INTRAMUSCULAR | Status: DC | PRN

## 2020-02-01 MED ORDER — ACETAMINOPHEN 10 MG/ML IV SOLN
1000.0000 mg | Freq: Four times a day (QID) | INTRAVENOUS | Status: DC
  Administered 2020-02-01: 1000 mg via INTRAVENOUS

## 2020-02-01 MED ORDER — PROPOFOL 10 MG/ML IV BOLUS
INTRAVENOUS | Status: DC | PRN
  Administered 2020-02-01: 200 mg via INTRAVENOUS

## 2020-02-01 MED ORDER — GABAPENTIN 300 MG PO CAPS
300.0000 mg | ORAL_CAPSULE | Freq: Once | ORAL | Status: AC
  Administered 2020-02-01: 300 mg via ORAL

## 2020-02-01 MED ORDER — PHENYLEPHRINE 40 MCG/ML (10ML) SYRINGE FOR IV PUSH (FOR BLOOD PRESSURE SUPPORT)
PREFILLED_SYRINGE | INTRAVENOUS | Status: AC
  Filled 2020-02-01: qty 10

## 2020-02-01 MED ORDER — FENTANYL CITRATE (PF) 100 MCG/2ML IJ SOLN
50.0000 ug | Freq: Once | INTRAMUSCULAR | Status: AC
  Administered 2020-02-01: 100 ug via INTRAVENOUS

## 2020-02-01 MED ORDER — CEFAZOLIN SODIUM-DEXTROSE 2-4 GM/100ML-% IV SOLN
INTRAVENOUS | Status: AC
  Filled 2020-02-01: qty 100

## 2020-02-01 MED ORDER — OXYCODONE HCL 5 MG/5ML PO SOLN: 5.0000 mg | Freq: Once | ORAL | Status: AC | PRN

## 2020-02-01 MED ORDER — ONDANSETRON HCL 4 MG/2ML IJ SOLN
INTRAMUSCULAR | Status: AC
  Filled 2020-02-01: qty 2

## 2020-02-01 MED ORDER — ROPIVACAINE HCL 5 MG/ML IJ SOLN
INTRAMUSCULAR | Status: DC | PRN
  Administered 2020-02-01 (×2): 5 mL via PERINEURAL

## 2020-02-01 MED ORDER — KETOROLAC TROMETHAMINE 30 MG/ML IJ SOLN
INTRAMUSCULAR | Status: AC
  Filled 2020-02-01: qty 1

## 2020-02-01 MED ORDER — LACTATED RINGERS IV SOLN: INTRAVENOUS | Status: DC

## 2020-02-01 MED ORDER — KETOROLAC TROMETHAMINE 30 MG/ML IJ SOLN
30.0000 mg | Freq: Once | INTRAMUSCULAR | Status: AC | PRN
  Administered 2020-02-01: 30 mg via INTRAVENOUS

## 2020-02-01 MED ORDER — CEFAZOLIN SODIUM-DEXTROSE 2-4 GM/100ML-% IV SOLN
2.0000 g | INTRAVENOUS | Status: AC
  Administered 2020-02-01: 2 g via INTRAVENOUS

## 2020-02-01 MED ORDER — PROPOFOL 500 MG/50ML IV EMUL
INTRAVENOUS | Status: AC
  Filled 2020-02-01: qty 50

## 2020-02-01 MED ORDER — OXYCODONE HCL 5 MG PO TABS
ORAL_TABLET | ORAL | Status: AC
  Filled 2020-02-01: qty 1

## 2020-02-01 MED ORDER — 0.9 % SODIUM CHLORIDE (POUR BTL) OPTIME
TOPICAL | Status: DC | PRN
  Administered 2020-02-01: 1000 mL

## 2020-02-01 MED ORDER — DEXMEDETOMIDINE HCL IN NACL 200 MCG/50ML IV SOLN
INTRAVENOUS | Status: DC | PRN
  Administered 2020-02-01: 8 ug via INTRAVENOUS
  Administered 2020-02-01: 4 ug via INTRAVENOUS
  Administered 2020-02-01: 8 ug via INTRAVENOUS
  Administered 2020-02-01: 20 ug via INTRAVENOUS

## 2020-02-01 MED ORDER — GLYCOPYRROLATE PF 0.2 MG/ML IJ SOSY
PREFILLED_SYRINGE | INTRAMUSCULAR | Status: AC
  Filled 2020-02-01: qty 1

## 2020-02-01 MED ORDER — ACETAMINOPHEN 325 MG PO TABS: 325.0000 mg | ORAL_TABLET | ORAL | Status: DC | PRN

## 2020-02-01 SURGICAL SUPPLY — 84 items
APL PRP STRL LF DISP 70% ISPRP (MISCELLANEOUS) ×1
APL SKNCLS STERI-STRIP NONHPOA (GAUZE/BANDAGES/DRESSINGS)
BANDAGE ESMARK 6X9 LF (GAUZE/BANDAGES/DRESSINGS) IMPLANT
BENZOIN TINCTURE PRP APPL 2/3 (GAUZE/BANDAGES/DRESSINGS) IMPLANT
BIT DRILL 4.8X200 CANN (BIT) ×2 IMPLANT
BIT DRILL CAL 2.5 ST W/SLV (BIT) ×4 IMPLANT
BLADE AVERAGE 25X9 (BLADE) IMPLANT
BLADE SURG 11 STRL SS (BLADE) ×2 IMPLANT
BLADE SURG 15 STRL LF DISP TIS (BLADE) ×8 IMPLANT
BLADE SURG 15 STRL SS (BLADE) ×16
BNDG CMPR 9X4 STRL LF SNTH (GAUZE/BANDAGES/DRESSINGS)
BNDG CMPR 9X6 STRL LF SNTH (GAUZE/BANDAGES/DRESSINGS)
BNDG COHESIVE 4X5 TAN STRL (GAUZE/BANDAGES/DRESSINGS) IMPLANT
BNDG ELASTIC 6X5.8 VLCR STR LF (GAUZE/BANDAGES/DRESSINGS) ×4 IMPLANT
BNDG ESMARK 4X9 LF (GAUZE/BANDAGES/DRESSINGS) IMPLANT
BNDG ESMARK 6X9 LF (GAUZE/BANDAGES/DRESSINGS)
CHLORAPREP W/TINT 26 (MISCELLANEOUS) ×2 IMPLANT
COVER BACK TABLE 60X90IN (DRAPES) ×2 IMPLANT
COVER MAYO STAND STRL (DRAPES) IMPLANT
COVER WAND RF STERILE (DRAPES) IMPLANT
CUFF TOURN SGL QUICK 34 (TOURNIQUET CUFF) ×2
CUFF TRNQT CYL 34X4.125X (TOURNIQUET CUFF) ×1 IMPLANT
DECANTER SPIKE VIAL GLASS SM (MISCELLANEOUS) IMPLANT
DRAPE C-ARM 42X72 X-RAY (DRAPES) ×2 IMPLANT
DRAPE C-ARMOR (DRAPES) ×2 IMPLANT
DRAPE EXTREMITY T 121X128X90 (DISPOSABLE) ×2 IMPLANT
DRAPE IMP U-DRAPE 54X76 (DRAPES) ×2 IMPLANT
DRAPE U-SHAPE 47X51 STRL (DRAPES) ×2 IMPLANT
DRIVER STRM ST T10 (ORTHOPEDIC DISPOSABLE SUPPLIES) ×2 IMPLANT
ELECT REM PT RETURN 9FT ADLT (ELECTROSURGICAL) ×2
ELECTRODE REM PT RTRN 9FT ADLT (ELECTROSURGICAL) ×1 IMPLANT
GAUZE SPONGE 4X4 12PLY STRL (GAUZE/BANDAGES/DRESSINGS) ×2 IMPLANT
GAUZE XEROFORM 1X8 LF (GAUZE/BANDAGES/DRESSINGS) ×2 IMPLANT
GLOVE BIOGEL M STRL SZ7.5 (GLOVE) ×2 IMPLANT
GLOVE BIOGEL PI IND STRL 8 (GLOVE) ×1 IMPLANT
GLOVE BIOGEL PI INDICATOR 8 (GLOVE) ×1
GOWN STRL REUS W/ TWL LRG LVL3 (GOWN DISPOSABLE) ×1 IMPLANT
GOWN STRL REUS W/ TWL XL LVL3 (GOWN DISPOSABLE) ×1 IMPLANT
GOWN STRL REUS W/TWL LRG LVL3 (GOWN DISPOSABLE) ×2
GOWN STRL REUS W/TWL XL LVL3 (GOWN DISPOSABLE) ×2
K-WIRE 1.8 (WIRE) ×2
K-WIRE FX200X1.8XTROC TIP (WIRE) ×1
KIT BIOCARTILAGE LG JOINT MIX (KITS) IMPLANT
KIT STRATUM INSTRUMENT STD (KITS) ×2 IMPLANT
KWIRE FX200X1.8XTROC TIP (WIRE) ×1 IMPLANT
NS IRRIG 1000ML POUR BTL (IV SOLUTION) ×2 IMPLANT
PACK BASIN DAY SURGERY FS (CUSTOM PROCEDURE TRAY) ×2 IMPLANT
PAD CAST 4YDX4 CTTN HI CHSV (CAST SUPPLIES) ×1 IMPLANT
PADDING CAST COTTON 4X4 STRL (CAST SUPPLIES) ×2
PADDING CAST SYNTHETIC 4 (CAST SUPPLIES) ×1
PADDING CAST SYNTHETIC 4X4 STR (CAST SUPPLIES) ×1 IMPLANT
PENCIL SMOKE EVACUATOR (MISCELLANEOUS) ×2 IMPLANT
PIN GUIDE DRILL TIP 2.8X300 (DRILL) ×4 IMPLANT
PLATE CC STRM LG RT (Plate) ×2 IMPLANT
PUTTY DBM STAGRAFT 5CC (Putty) ×2 IMPLANT
SCREW CANN 5.0X40MM (Screw) ×2 IMPLANT
SCREW CANNULATED 6.5X55 KNEE (Screw) ×2 IMPLANT
SCREW CANNULATED 6.5X65 KNEE (Screw) ×2 IMPLANT
SCREW CANNULATED HIP 8.0X75MM (Screw) ×2 IMPLANT
SCREW MDS ST STRM 3.5X20 (Screw) ×2 IMPLANT
SCREW MDS ST STRM 3.5X22 (Screw) ×2 IMPLANT
SCREW MDS ST STRM 3.5X30 (Screw) ×4 IMPLANT
SCREW P/T IMPL CANN 5.0X36 (Screw) ×2 IMPLANT
SCREW PT 5.0X46MM (Screw) ×2 IMPLANT
SCREW STRATUM NL LP ST 3.5X24 (Screw) ×2 IMPLANT
SHEET MEDIUM DRAPE 40X70 STRL (DRAPES) ×2 IMPLANT
SLEEVE SCD COMPRESS KNEE MED (MISCELLANEOUS) ×2 IMPLANT
SPLINT FAST PLASTER 5X30 (CAST SUPPLIES) ×20
SPLINT PLASTER CAST FAST 5X30 (CAST SUPPLIES) ×20 IMPLANT
SPONGE LAP 18X18 RF (DISPOSABLE) ×2 IMPLANT
STOCKINETTE 6  STRL (DRAPES) ×2
STOCKINETTE 6 STRL (DRAPES) ×1 IMPLANT
STRIP CLOSURE SKIN 1/2X4 (GAUZE/BANDAGES/DRESSINGS) IMPLANT
SUCTION FRAZIER HANDLE 10FR (MISCELLANEOUS) ×2
SUCTION TUBE FRAZIER 10FR DISP (MISCELLANEOUS) ×1 IMPLANT
SUT ETHILON 3 0 PS 1 (SUTURE) ×6 IMPLANT
SUT MNCRL AB 3-0 PS2 18 (SUTURE) ×4 IMPLANT
SUT PDS AB 2-0 CT2 27 (SUTURE) ×2 IMPLANT
SUT VIC AB 3-0 FS2 27 (SUTURE) IMPLANT
SYR 5ML LL (SYRINGE) ×2 IMPLANT
SYR BULB EAR ULCER 3OZ GRN STR (SYRINGE) ×2 IMPLANT
TOWEL GREEN STERILE FF (TOWEL DISPOSABLE) ×4 IMPLANT
TUBE CONNECTING 20X1/4 (TUBING) ×2 IMPLANT
UNDERPAD 30X36 HEAVY ABSORB (UNDERPADS AND DIAPERS) ×2 IMPLANT

## 2020-02-01 NOTE — Anesthesia Procedure Notes (Signed)
Procedure Name: LMA Insertion Date/Time: 02/01/2020 7:43 AM Performed by: Ronnette Hila, CRNA Pre-anesthesia Checklist: Patient identified, Emergency Drugs available, Suction available and Patient being monitored Patient Re-evaluated:Patient Re-evaluated prior to induction Oxygen Delivery Method: Circle system utilized Preoxygenation: Pre-oxygenation with 100% oxygen Induction Type: IV induction Ventilation: Mask ventilation without difficulty LMA: LMA inserted LMA Size: 4.0 Number of attempts: 1 Airway Equipment and Method: Bite block Placement Confirmation: positive ETCO2 Tube secured with: Tape Dental Injury: Teeth and Oropharynx as per pre-operative assessment

## 2020-02-01 NOTE — Discharge Instructions (Signed)
Next dose of Tylenol can be given at 6:45pm if needed. Next dose of NSAIDS (Ibuprofen/Motrin/Aleve) can be given at 6:30pm if needed.   DR. Susa Simmonds FOOT & ANKLE SURGERY POST-OP INSTRUCTIONS   Pain Management 1. The numbing medicine and your leg will last around 18 hours, take a dose of your pain medicine as soon as you feel it wearing off to avoid rebound pain. 2. Keep your foot elevated above heart level.  Make sure that your heel hangs free ('floats'). 3. Take all prescribed medication as directed. 4. If taking narcotic pain medication you may want to use an over-the-counter stool softener to avoid constipation. 5. You may take over-the-counter NSAIDs (ibuprofen, naproxen, etc.) as well as over-the-counter acetaminophen as directed on the packaging as a supplement for your pain and may also use it to wean away from the prescription medication.  Activity ? Non-weightbearing ? Keep splint intact  First Postoperative Visit 1. Your first postop visit will be at least 2 weeks after surgery.  This should be scheduled when you schedule surgery. 2. If you do not have a postoperative visit scheduled please call [phone] to schedule an appointment. 3. At the appointment your incision will be evaluated for suture removal, x-rays will be obtained if necessary.  General Instructions 1. Swelling is very common after foot and ankle surgery.  It often takes 3 months for the foot and ankle to begin to feel comfortable.  Some amount of swelling will persist for 6-12 months. 2. DO NOT change the dressing.  If there is a problem with the dressing (too tight, loose, gets wet, etc.) please contact Dr. Donnie Mesa office. 3. DO NOT get the dressing wet.  For showers you can use an over-the-counter cast cover or wrap a washcloth around the top of your dressing and then cover it with a plastic bag and tape it to your leg. 4. DO NOT soak the incision (no tubs, pools, bath, etc.) until you have approval from Dr.  Susa Simmonds.  Contact Dr. Garret Reddish office or go to Emergency Room if: 1. Temperature above 101 F. 2. Increasing pain that is unresponsive to pain medication or elevation 3. Excessive redness or swelling in your foot 4. Dressing problems - excessive bloody drainage, looseness or tightness, or if dressing gets wet 5. Develop pain, swelling, warmth, or discoloration of your calf   Post Anesthesia Home Care Instructions  Activity: Get plenty of rest for the remainder of the day. A responsible individual must stay with you for 24 hours following the procedure.  For the next 24 hours, DO NOT: -Drive a car -Advertising copywriter -Drink alcoholic beverages -Take any medication unless instructed by your physician -Make any legal decisions or sign important papers.  Meals: Start with liquid foods such as gelatin or soup. Progress to regular foods as tolerated. Avoid greasy, spicy, heavy foods. If nausea and/or vomiting occur, drink only clear liquids until the nausea and/or vomiting subsides. Call your physician if vomiting continues.  Special Instructions/Symptoms: Your throat may feel dry or sore from the anesthesia or the breathing tube placed in your throat during surgery. If this causes discomfort, gargle with warm salt water. The discomfort should disappear within 24 hours.  If you had a scopolamine patch placed behind your ear for the management of post- operative nausea and/or vomiting:  1. The medication in the patch is effective for 72 hours, after which it should be removed.  Wrap patch in a tissue and discard in the trash. Wash hands thoroughly with  soap and water. 2. You may remove the patch earlier than 72 hours if you experience unpleasant side effects which may include dry mouth, dizziness or visual disturbances. 3. Avoid touching the patch. Wash your hands with soap and water after contact with the patch.     Regional Anesthesia Blocks  1. Numbness or the inability to move the  "blocked" extremity may last from 3-48 hours after placement. The length of time depends on the medication injected and your individual response to the medication. If the numbness is not going away after 48 hours, call your surgeon.  2. The extremity that is blocked will need to be protected until the numbness is gone and the  Strength has returned. Because you cannot feel it, you will need to take extra care to avoid injury. Because it may be weak, you may have difficulty moving it or using it. You may not know what position it is in without looking at it while the block is in effect.  3. For blocks in the legs and feet, returning to weight bearing and walking needs to be done carefully. You will need to wait until the numbness is entirely gone and the strength has returned. You should be able to move your leg and foot normally before you try and bear weight or walk. You will need someone to be with you when you first try to ensure you do not fall and possibly risk injury.  4. Bruising and tenderness at the needle site are common side effects and will resolve in a few days.  5. Persistent numbness or new problems with movement should be communicated to the surgeon or the [facility] [phone] [facility] [phone]).

## 2020-02-01 NOTE — Addendum Note (Signed)
Addendum  created 02/01/20 1256 by Ronnette Hila, CRNA   Attestation recorded in [place], Intraprocedure Attestations filed

## 2020-02-01 NOTE — Brief Op Note (Signed)
02/01/2020  11:15 AM  PATIENT:  Lauren Greene  54 y.o. female  PRE-OPERATIVE DIAGNOSIS:  SYMPTOMATIC PES PLANOVALGUS, POSTERIOR TIBIAL TENDINOSIS, SYMPTOMATIC ACCESSORY NAVICULAR  POST-OPERATIVE DIAGNOSIS:  SYMPTOMATIC PES PLANOVALGUS, POSTERIOR TIBIAL TENDINOSIS, SYMPTOMATIC ACCESSORY NAVICULAR  PROCEDURE:  Procedure(s) with comments: TRIPLE ARTHRODESIS,  TENDO ACHILLES LENGTHENING (Right) - LENGTH OF SURGERY: 4 HOURS ACHILLES TENDON LENGTHENING (Right)  SURGEON:  Surgeon(s) and Role:    * Terance Hart, MD - Primary  PHYSICIAN ASSISTANT:   ASSISTANTS: none   ANESTHESIA:   general  EBL:  120 mL   BLOOD ADMINISTERED:none  DRAINS: none   LOCAL MEDICATIONS USED:  NONE  SPECIMEN:  No Specimen  DISPOSITION OF SPECIMEN:  N/A  COUNTS:  YES  TOURNIQUET:   Total Tourniquet Time Documented: Thigh (Right) - 120 minutes Total: Thigh (Right) - 120 minutes   DICTATION: .Dragon Dictation  PLAN OF CARE: Discharge to home after PACU  PATIENT DISPOSITION:  PACU - hemodynamically stable.   Delay start of Pharmacological VTE agent (>24hrs) due to surgical blood loss or risk of bleeding: yes

## 2020-02-01 NOTE — Transfer of Care (Signed)
Immediate Anesthesia Transfer of Care Note  Patient: Lauren Greene  Procedure(s) Performed: TRIPLE ARTHRODESIS,  TENDO ACHILLES LENGTHENING (Right Foot) ACHILLES TENDON LENGTHENING (Right Foot)  Patient Location: PACU  Anesthesia Type:GA combined with regional for post-op pain  Level of Consciousness: sedated  Airway & Oxygen Therapy: Patient Spontanous Breathing and Patient connected to face mask oxygen  Post-op Assessment: Report given to RN and Post -op Vital signs reviewed and stable  Post vital signs: Reviewed and stable  Last Vitals:  Vitals Value Taken Time  BP    Temp    Pulse 56 02/01/20 1122  Resp 10 02/01/20 1122  SpO2 93 % 02/01/20 1122  Vitals shown include unvalidated device data.  Last Pain:  Vitals:   02/01/20 0634  TempSrc: Oral  PainSc: 0-No pain         Complications: No complications documented.

## 2020-02-01 NOTE — Anesthesia Procedure Notes (Addendum)
Anesthesia Regional Block: Adductor canal block   Pre-Anesthetic Checklist: ,, timeout performed, Correct Patient, Correct Site, Correct Laterality, Correct Procedure, Correct Position, site marked, Risks and benefits discussed,  Surgical consent,  Pre-op evaluation,  At surgeon's request and post-op pain management  Laterality: Lower and Right  Prep: chloraprep       Needles:  Injection technique: Single-shot  Needle Type: Echogenic Stimulator Needle     Needle Length: 9cm  Needle Gauge: 20   Needle insertion depth: 2.5 cm   Additional Needles:   Procedures:,,,, ultrasound used (permanent image in chart),,,,  Narrative:  Start time: 02/01/2020 7:10 AM End time: 02/01/2020 7:20 AM Injection made incrementally with aspirations every 5 mL.  Performed by: Personally  Anesthesiologist: Leilani Able, MD

## 2020-02-01 NOTE — Progress Notes (Signed)
Assisted Dr. Hatchett with right, ultrasound guided, popliteal, adductor canal block. Side rails up, monitors on throughout procedure. See vital signs in flow sheet. Tolerated Procedure well. 

## 2020-02-01 NOTE — H&P (Signed)
PREOPERATIVE H&P  Chief Complaint: Right foot pain and deformity  HPI: Lauren Greene is a 54 y.o. female who presents for preoperative history and physical with a diagnosis of right symptomatic pes planovalgus with forefoot deformities.  She has collapse through her talonavicular joint and subtalar joint with subluxation and arthritic change.  She has failed conservative treatment in the form of boot immobilization, anti-inflammatories, activity modifications and physical therapy. Symptoms are rated as moderate to severe, and have been worsening.  This is significantly impairing activities of daily living.  She has elected for surgical management.   Past Medical History:  Diagnosis Date  . ADHD (attention deficit hyperactivity disorder)   . Arthritis   . Back pain   . Broken femur (HCC) 08/27/12  . Chronic pain    chronic opioid use  . GERD (gastroesophageal reflux disease)   . Migraine   . Osteoarthritis   . Raynaud disease   . Raynaud phenomenon 04/06/2018   [facility] podiatry diagnosed 2020   Past Surgical History:  Procedure Laterality Date  . BACK SURGERY    . CERVICAL BIOPSY  W/ LOOP ELECTRODE EXCISION  1990's   in [place], [place]  . ENDOMETRIAL BIOPSY  04-05-05 and 02-12-06   04-05-05 benign but too rapid growth. RXd Provera and repeat Bx 66mo. 02-12-06 Bx benign--Dr. Leda Quail  . FEMUR IM NAIL Left 08/27/2012   Procedure: INTRAMEDULLARY (IM) RETROGRADE FEMORAL NAILING;  Surgeon: Kathryne Hitch, MD;  Location: [facility] OR;  Service: Orthopedics;  Laterality: Left;  . HEEL SPUR SURGERY    . IM NAILING FEMORAL SHAFT FRACTURE Left 08/27/2012   Dr Magnus Ivan  . SHOULDER SURGERY Left   . TOTAL KNEE ARTHROPLASTY     x2  . TUBAL LIGATION    . VAGINAL HYSTERECTOMY     Social History   Socioeconomic History  . Marital status: Single    Spouse name: Not on file  . Number of children: Not on file  . Years of education: Not on file  . Highest education level: Not on file   Occupational History  . Not on file  Tobacco Use  . Smoking status: Former Smoker    Years: 10.00    Types: Cigarettes    Quit date: 08/16/2000    Years since quitting: 19.4  . Smokeless tobacco: Never Used  Vaping Use  . Vaping Use: Never used  Substance and Sexual Activity  . Alcohol use: Yes    Alcohol/week: 0.0 standard drinks    Comment: rarely  . Drug use: No  . Sexual activity: Not Currently    Partners: Female  Other Topics Concern  . Not on file  Social History Narrative  . Not on file   Social Determinants of Health   Financial Resource Strain:   . Difficulty of Paying Living Expenses: Not on file  Food Insecurity:   . Worried About Programme researcher, broadcasting/film/video in the Last Year: Not on file  . Ran Out of Food in the Last Year: Not on file  Transportation Needs:   . Lack of Transportation (Medical): Not on file  . Lack of Transportation (Non-Medical): Not on file  Physical Activity:   . Days of Exercise per Week: Not on file  . Minutes of Exercise per Session: Not on file  Stress:   . Feeling of Stress : Not on file  Social Connections:   . Frequency of Communication with Friends and Family: Not on file  . Frequency of Social Gatherings with  Friends and Family: Not on file  . Attends Religious Services: Not on file  . Active Member of Clubs or Organizations: Not on file  . Attends Banker Meetings: Not on file  . Marital Status: Not on file   Family History  Problem Relation Age of Onset  . Cancer Mother   . Breast cancer Mother   . Bipolar disorder Mother   . Heart failure Father   . Cancer Father   . Cancer Other   . Heart failure Other   . Bipolar disorder Sister    Allergies  Allergen Reactions  . Wellbutrin [Bupropion]     Anger issues  . Lyrica [Pregabalin] Swelling    Leg swelling   Prior to Admission medications   Medication Sig Start Date End Date Taking? Authorizing Provider  amLODipine (NORVASC) 2.5 MG tablet TAKE 1 TABLET BY  MOUTH ONCE A DAY. 08/30/19  Yes Babs Sciara, MD  amphetamine-dextroamphetamine (ADDERALL XR) 30 MG 24 hr capsule 2 qam 11/30/19  Yes Luking, Scott A, MD  Diclofenac Sodium CR 100 MG 24 hr tablet TAKE 1 TABLET BY MOUTH TWICE DAILY. TAKE WITH FOOD. 12/21/19  Yes Luking, Jonna Coup, MD  DULoxetine (CYMBALTA) 60 MG capsule TAKE 2 CAPSULES BY MOUTH DAILY. 10/19/19  Yes Luking, Scott A, MD  lidocaine (XYLOCAINE) 5 % ointment Apply small amount externally up two twice daily.  Do not use for more than 5 days in a row. 05/19/19  Yes Jerene Bears, MD  methocarbamol (ROBAXIN) 750 MG tablet Take 750 mg by mouth as needed for muscle spasms.   Yes [provider]  nystatin (MYCOSTATIN/NYSTOP) powder May apply as needed several times per day 08/30/19  Yes Luking, Jonna Coup, MD  omeprazole (PRILOSEC) 40 MG capsule TAKE (1) CAPSULE BY MOUTH ONCE DAILY. 08/30/19  Yes Babs Sciara, MD  Oxybutynin Chloride 10 % GEL Apply daily to skin for overactive bladder 04/06/18  Yes Luking, Jonna Coup, MD  oxyCODONE-acetaminophen (PERCOCET) 10-325 MG tablet Take 1 tablet by mouth every 6 (six) hours as needed for pain. Must last 30 days. 01/19/20 02/18/20 Yes Edward Jolly, MD  traZODone (DESYREL) 100 MG tablet 100 mg at night as needed for sleep 08/30/19  Yes Luking, Scott A, MD  amphetamine-dextroamphetamine (ADDERALL XR) 30 MG 24 hr capsule TAKE 2 CAPSULES BY MOUTH EACH MORNING. 11/30/19   Babs Sciara, MD  amphetamine-dextroamphetamine (ADDERALL XR) 30 MG 24 hr capsule TAKE 2 CAPSULES BY MOUTH EVERY MORNING. 11/30/19   Babs Sciara, MD     Positive ROS: All other systems have been reviewed and were otherwise negative with the exception of those mentioned in the HPI and as above.  Physical Exam:  Vitals:   02/01/20 0634  BP: (!) 146/76  Pulse: (!) 56  Resp: 16  Temp: 98 F (36.7 C)  SpO2: 100%   General: Alert, no acute distress Cardiovascular: No pedal edema Respiratory: No cyanosis, no use of accessory  musculature GI: No organomegaly, abdomen is soft and non-tender Skin: No lesions in the area of chief complaint Neurologic: Sensation intact distally Psychiatric: Patient is competent for consent with normal mood and affect Lymphatic: No axillary or cervical lymphadenopathy  MUSCULOSKELETAL: Right foot demonstrates pes planovalgus deformity.  She also has forefoot deformities with hammertoes and elongated toes and failed prior forefoot surgery.  She has tenderness over the talonavicular joint, posterior tibial tendon and sinus Tarsi.  Flexible hindfoot overall.  Palpable accessory navicular that is tender.  Weakness with hindfoot inversion and pain against resistance.  Assessment: Symptomatic pes planovalgus with collapse of the talonavicular joint and forefoot abduction.   Plan: Plan for double versus triple arthrodesis for correction of her symptomatic pes planovalgus.  We will also perform a TAL and likely posterior tibial tendon debridement and excision of her painful accessory navicular.  We previously discussed the risk, benefits and alternatives to surgery were again discussed this morning.    We discussed the risks, benefits and alternatives of surgery which include but are not limited to wound healing complications, infection, nonunion, malunion, need for further surgery, damage to surrounding structures and continued pain.  They understand there is no guarantees to an acceptable outcome.  After weighing these risks they opted to proceed with surgery.     Terance Hart, MD    02/01/2020 7:14 AM

## 2020-02-01 NOTE — Anesthesia Procedure Notes (Addendum)
Anesthesia Regional Block: Popliteal block   Pre-Anesthetic Checklist: ,, timeout performed, Correct Patient, Correct Site, Correct Laterality, Correct Procedure, Correct Position, site marked, Risks and benefits discussed,  Surgical consent,  Pre-op evaluation,  At surgeon's request and post-op pain management  Laterality: Lower and Right  Prep: chloraprep       Needles:  Injection technique: Single-shot  Needle Type: Echogenic Stimulator Needle     Needle Length: 9cm  Needle Gauge: 20   Needle insertion depth: 1.5 cm   Additional Needles:   Procedures:,,,, ultrasound used (permanent image in chart),,,,  Narrative:  Start time: 02/01/2020 7:20 AM End time: 02/01/2020 7:30 AM Injection made incrementally with aspirations every 5 mL.  Performed by: Personally  Anesthesiologist: Leilani Able, MD

## 2020-02-01 NOTE — Anesthesia Postprocedure Evaluation (Signed)
Anesthesia Post Note  Patient: Lauren Greene  Procedure(s) Performed: TRIPLE ARTHRODESIS,  TENDO ACHILLES LENGTHENING (Right Foot) ACHILLES TENDON LENGTHENING (Right Foot)     Patient location during evaluation: PACU Anesthesia Type: General Level of consciousness: awake and sedated Pain management: pain level controlled Vital Signs Assessment: post-procedure vital signs reviewed and stable Respiratory status: spontaneous breathing Cardiovascular status: stable Postop Assessment: no apparent nausea or vomiting Anesthetic complications: no   No complications documented.  Last Vitals:  Vitals:   02/01/20 1144 02/01/20 1145  BP: (!) 101/57 (!) 101/57  Pulse: 64 62  Resp: 14 10  Temp:    SpO2: 100% 100%    Last Pain:  Vitals:   02/01/20 1145  TempSrc:   PainSc: Asleep                 Caren Macadam

## 2020-02-02 ENCOUNTER — Encounter (HOSPITAL_BASED_OUTPATIENT_CLINIC_OR_DEPARTMENT_OTHER): Payer: Self-pay | Admitting: Orthopaedic Surgery

## 2020-02-05 ENCOUNTER — Encounter: Payer: Self-pay | Admitting: Family Medicine

## 2020-02-16 DIAGNOSIS — Z9889 Other specified postprocedural states: Secondary | ICD-10-CM | POA: Diagnosis not present

## 2020-02-17 NOTE — Op Note (Signed)
Lauren Greene female 54 y.o. 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  Severe pes planovalgus deformity.  Equinus contracture  IMPLANTS: Paragon calcaneocuboid compression plate for arthrodesis Partially-threaded cannulated screws  INDICATIONS:54 y.o. female with symptomatic pes planovalgus and severe deformity through her forefoot and hindfoot had continued pain and failed conservative treatment for boot immobilization, anti-inflammatories, activity modifications and physical therapy.  She had CT evidence of arthritic change within the subtalar joint and severe deformity through her transverse tarsal joints.  On exam she had equinus contracture.   Patient understood the risks, benefits and alternatives to surgery which include but are not limited to wound healing complications, infection, nonunion, malunion, need for further surgery as well as damage to surrounding structures. They also understood the potential for continued pain in that there were no guarantees of acceptable outcome After weighing these risks the patient opted to proceed with surgery.  PROCEDURE: Patient was identified in the preoperative holding area.  The right leg was marked by myself.  Consent was signed by myself and the patient.  Block was performed by anesthesia in the preoperative holding area.  Patient was taken to the operative suite and placed supine on the operative table.  General LMA anesthesia was induced without difficulty. Bump was placed under the operative hip and bone foam was used.  All bony prominences were well padded.  Tourniquet  was placed on the operative thigh.  Preoperative antibiotics were given. The extremity was prepped and draped in the usual sterile fashion and surgical timeout was performed.  The limb was elevated and the tourniquet was inflated to 250 mmHg.  We began by holding the ankle elevated.  The ankle was dorsiflexed and found to have an equinus contracture.  Then a Hoke type tendo Achilles lengthening was performed using an 11 blade with a triple hemisection technique within the Achilles.  There was acceptable increase in dorsiflexion afterwards.  We then turned our attention of the triple arthrodesis portion of the case.  We began to make a longitudinal incision overlying the sinus Tarsi.  This taken sharply down through skin and subcutaneous tissue.  Blunt dissection was used to mobilize skin flaps dorsally and plantarly.  Then the sheath overlying the peroneal tendons was identified.  The sheath was incised in line with the tendons to mobilize them for exposure to the subtalar joint.  Then the tissue within the sinus Tarsi was identified and the joint capsule was violated using sharp dissection.  Then the subtalar joint was further mobilized.  The interosseous ligaments were transected to gain access to the sinus Tarsi.  There was some evidence of cartilage wear this area.  There is subluxation laterally of the subtalar joint.  Lamina spreader was placed within the subtalar joint to spread apart the joint for cartilage removal.  Once the joint was mobilized using a curette and osteotome the cartilage surfaces on the plantar aspect of the talus in the dorsal aspect of the calcaneus and all 3 facets was removed.  The joint was irrigated to ensure adequate removal of all the cartilage surfaces.  The incision was then extended distally to gain access to the calcaneocuboid joint.  Care was taken to identify any branch of the sural nerve which  were not identified.  The calcaneocuboid joint was identified and the  capsular tissue was incised in line with the joint.  Then the capsular tissue was mobilized dorsally and plantarly about the joint to gain access.  Osteotome was placed within the joint to distract it.  The lamina spreader was placed.  Curette and rondure as well as osteotome was used to denude the cartilage surface on the distal aspect of the anterior process of the calcaneus and the proximal aspect of the cuboid at the calcaneocuboid joint.  The joint was irrigated and found to be adequately debrided of cartilage.  Then a drill was used to trephinate the bony surfaces to prep the joint for arthrodesis of the calcaneocuboid joint.  We then turned our attention back to the subtalar joint.  Lamina spreader was replaced and the drill was used to trephinate the bony surfaces of all 3 facets.  Care was taken to adequately trephinated prep the joint for arthrodesis.  We then turned our attention to the dorsal midfoot.  Incision was made overlying the talonavicular joint.  This was taken sharply down through skin and subcutaneous tissue.  The interval was between the tibialis anterior and extensor houses longus tendons.  The tendon sheath was identified and incised in line with the tibialis anterior for tendon mobilization.  The neurovascular bundle was not encountered.  Further dissection down to the joint capsule was performed sharply.  Then the tissue was retracted laterally medially.  The capsular tissue overlying the talonavicular joint was incised in line with the joint and the joint was further mobilized.  Lamina spreader was used to distract the joint after further mobilization and the cartilage surfaces were denuded of cartilage using a osteotome and a rondure as well as a curette.  The joint was then irrigated to confirm appropriate cartilage removal.  This was confirmed.  Then a drill was used to trephinate the distal aspect of the talar head and the proximal aspect of the navicular at the talonavicular  joint.  Then we turned our attention back to the subtalar joint.  The subtalar joint was held in a reduced position and confirmed on fluoroscopy to be adequate.  K wire was placed to the talar neck down across the posterior facet of the subtalar joint into the calcaneus.  Appropriate pin placement was confirmed on fluoroscopy.  Then a partially-threaded cannulated screw was placed across this pin after drilling to fix the subtalar joint.  There was good bite and compression.  Appropriate screw length was confirmed.  We then turned our attention to the lateral aspect of the subtalar joint.  A small incision was made on the lateral plantar aspect of the foot and a second K wire was placed from the lateral wall the calcaneus up across the posterior facet of the subtalar joint into the talar body.  Appropriate position of the pin was confirmed.  Once measured the bone was drilled over the cannulated screw.  Then a partially-threaded cannulated screw was placed across the joint to add further fixation of the subtalar arthrodesis.  Screw length was confirmed on fluoroscopy and position was found to be acceptable.  We then turned attention to the talonavicular joint.  The joint was reduced with the forefoot in a more abducted position and correcting the break in Mary's angle.  The joint was then held provisionally with K wire fixation.  Once acceptably reduced and confirmed arthroscopy 3 partially-threaded cannulated screws were placed across the talonavicular joint to fix the joint.  Appropriate screw length and position was confirmed on fluoroscopy.  We then turned our attention to the calcaneocuboid joint.  There is still some subluxation of the calcaneocuboid joint so direct reduction was performed under visualization and a dorsally directed force underlying the cuboid was performed.  This was then held provisionally with K wire fixation.  A calcaneocuboid joint arthrodesis locking plate was placed without  difficulty.  Good compression across the plate was obtained.  After the plate was placed the foot was checked in a neutral dorsiflexed position found to be acceptably positioned with respect to the  arthrodesis sites.  Then DBX bone graft was placed within the subtalar joint, talonavicular joint and calcaneocuboid joint at any available site.  The wound was then reirrigated with normal saline.  The tourniquet was released.  Hemostasis was obtained as well as could be obtained given the exposed cancellous bone.  Then the tissues were closed in a layered fashion using 3-0 Monocryl and 3-0 nylon suture for each incision..  Soft dressing was then placed.  She was placed in a short leg nonweightbearing splint.  She was then awake from anesthesia and taken recovery in stable condition.  There are no complications.  She tolerated procedure well.  All counts were correct at the end of the case.    POST OPERATIVE INSTRUCTIONS: Nonweightbearing to operative extremity Keep splint dry and intact Elevate limb Take 1 325 mg aspirin daily for DVT prophylaxis Follow-up in 2 weeks for splint removal, suture removal and x-rays of the right foot on arrival these will be nonweightbearing.  Tourniquet time: 120 minutes  BLOOD LOSS:  less than 100 mL         DRAINS: none         SPECIMEN: none       COMPLICATIONS:  * No complications entered in OR log *         Disposition: PACU - hemodynamically stable.         Condition: stable

## 2020-02-28 ENCOUNTER — Other Ambulatory Visit: Payer: Self-pay

## 2020-02-28 ENCOUNTER — Ambulatory Visit (INDEPENDENT_AMBULATORY_CARE_PROVIDER_SITE_OTHER): Payer: PPO | Admitting: Family Medicine

## 2020-02-28 VITALS — BP 120/78

## 2020-02-28 DIAGNOSIS — F988 Other specified behavioral and emotional disorders with onset usually occurring in childhood and adolescence: Secondary | ICD-10-CM | POA: Diagnosis not present

## 2020-02-28 DIAGNOSIS — I10 Essential (primary) hypertension: Secondary | ICD-10-CM

## 2020-02-28 MED ORDER — NYSTATIN 100000 UNIT/GM EX POWD
CUTANEOUS | 5 refills | Status: DC
Start: 2020-02-28 — End: 2021-06-25

## 2020-02-28 MED ORDER — OXYBUTYNIN CHLORIDE 10 % TD GEL
TRANSDERMAL | 5 refills | Status: DC
Start: 2020-02-28 — End: 2021-04-04

## 2020-02-28 NOTE — Progress Notes (Signed)
   Subjective:    Patient ID: Lauren Greene, female    DOB: 10-Oct-1965, 54 y.o.   MRN: [id number]  HPIADD check up.  Patient feels that she does good job with medicine Allows her to focus Denies any side effects or negative aspects regarding the medicine Overall energy level doing okay  Pt would like to get refill on nystatin powder and see if she can get a bigger bottle. 60 gram.   Needs refill on oxybutynin gel.  Virtual Visit via Telephone Note  I connected with Melburn Hake on 02/28/20 at 10:00 AM EST by telephone and verified that I am speaking with the correct person using two identifiers.  Location: Patient: home Provider: office  I discussed the limitations, risks, security and privacy concerns of performing an evaluation and management service by telephone and the availability of in person appointments. I also discussed with the patient that there may be a patient responsible charge related to this service. The patient expressed understanding and agreed to proceed.   History of Present Illness:    Observations/Objective:   Assessment and Plan:   Follow Up Instructions:    I discussed the assessment and treatment plan with the patient. The patient was provided an opportunity to ask questions and all were answered. The patient agreed with the plan and demonstrated an understanding of the instructions.   The patient was advised to call back or seek an in-person evaluation if the symptoms worsen or if the condition fails to improve as anticipated.  I provided 20 minutes of non-face-to-face time during this encounter.      Review of Systems  Constitutional: Negative for activity change, appetite change and fatigue.  HENT: Negative for congestion.   Respiratory: Negative for cough.   Cardiovascular: Negative for chest pain.  Gastrointestinal: Negative for abdominal pain.  Skin: Negative for color change.  Neurological: Negative for headaches.   Psychiatric/Behavioral: Negative for behavioral problems.       Objective:   Physical Exam  Today's visit was via telephone Physical exam was not possible for this visit        Assessment & Plan:  1. Essential hypertension Blood pressure good control continue current measures watch salt in diet  2. Attention deficit disorder, unspecified hyperactivity presence 3 scripts were given Helps patient focus Does not abuse Blood pressure under good control Tolerates medicine well  Follow-up 3 months

## 2020-02-28 NOTE — Addendum Note (Signed)
Addended by: Metro Kung on: 02/28/2020 01:34 PM   Modules accepted: Orders

## 2020-02-29 ENCOUNTER — Telehealth: Payer: Self-pay | Admitting: *Deleted

## 2020-02-29 ENCOUNTER — Ambulatory Visit: Payer: PPO | Admitting: Family Medicine

## 2020-02-29 NOTE — Telephone Encounter (Signed)
Left message to return call 

## 2020-02-29 NOTE — Telephone Encounter (Signed)
Stop the 81 mg aspirin

## 2020-02-29 NOTE — Telephone Encounter (Signed)
Pt returned call. Pt verbalized understanding to stop 81 mg aspirin.   Pt states that provider sent over Oxybutin as a compound, insurance was covering it but now they will not. April (pharmacist) changed script and it will be delivered tomorrow

## 2020-02-29 NOTE — Telephone Encounter (Signed)
Lmtc

## 2020-02-29 NOTE — Telephone Encounter (Signed)
So at this point I would recommend to discuss this with Laqueisha and let her know  Potentially she will want to try oxybutynin orally extended release  This would be more so up to her preference  As for the nystatin powder if that is also very expensive patient could consider nystatin cream Please check with patient then forward responses thank you

## 2020-02-29 NOTE — Telephone Encounter (Signed)
Patient is going to talk to the pharmacy before deciding on a change in medication.   Patient also forgot to mention at her visit yesterday - she has been taking Aspirin 81mg  daily for a long time now per Dr. but since she is now taking the diclofenac daily should she stop the aspirin?

## 2020-03-01 DIAGNOSIS — Z9889 Other specified postprocedural states: Secondary | ICD-10-CM | POA: Diagnosis not present

## 2020-03-02 ENCOUNTER — Other Ambulatory Visit: Payer: Self-pay

## 2020-03-02 ENCOUNTER — Encounter: Payer: Self-pay | Admitting: Student in an Organized Health Care Education/Training Program

## 2020-03-02 ENCOUNTER — Ambulatory Visit
Payer: PPO | Attending: Student in an Organized Health Care Education/Training Program | Admitting: Student in an Organized Health Care Education/Training Program

## 2020-03-02 DIAGNOSIS — M48062 Spinal stenosis, lumbar region with neurogenic claudication: Secondary | ICD-10-CM

## 2020-03-02 DIAGNOSIS — Z79891 Long term (current) use of opiate analgesic: Secondary | ICD-10-CM | POA: Diagnosis not present

## 2020-03-02 DIAGNOSIS — M47816 Spondylosis without myelopathy or radiculopathy, lumbar region: Secondary | ICD-10-CM

## 2020-03-02 DIAGNOSIS — G8929 Other chronic pain: Secondary | ICD-10-CM | POA: Diagnosis not present

## 2020-03-02 DIAGNOSIS — G894 Chronic pain syndrome: Secondary | ICD-10-CM

## 2020-03-02 DIAGNOSIS — M19071 Primary osteoarthritis, right ankle and foot: Secondary | ICD-10-CM | POA: Diagnosis not present

## 2020-03-02 DIAGNOSIS — M5416 Radiculopathy, lumbar region: Secondary | ICD-10-CM

## 2020-03-02 MED ORDER — OXYCODONE-ACETAMINOPHEN 10-325 MG PO TABS: 1.0000 | ORAL_TABLET | Freq: Four times a day (QID) | ORAL | 0 refills | Status: DC | PRN

## 2020-03-02 NOTE — Progress Notes (Signed)
Patient: Lauren Greene  Service Category: E/M  Provider: Gillis Santa, MD  DOB: 09/26/1965  DOS: 03/02/2020  Location: Office  MRN: [id number]  Setting: Ambulatory outpatient  Referring Provider: Kathyrn Drown, MD  Type: Established Patient  Specialty: Interventional Pain Management  PCP: Kathyrn Drown, MD  Location: Home  Delivery: TeleHealth     Virtual Encounter - Pain Management PROVIDER NOTE: Information contained herein reflects review and annotations entered in association with encounter. Interpretation of such information and data should be left to medically-trained personnel. Information provided to patient can be located elsewhere in the medical record under "Patient Instructions". Document created using STT-dictation technology, any transcriptional errors that may result from process are unintentional.    Contact & Pharmacy Preferred: [phone] Home: [phone] (home) Mobile: [phone] (mobile) E-mail: [email or web address]  [place], [place] [address] Phone: [phone] Fax: [phone]   Pre-screening  Lauren Greene offered "in-person" vs "virtual" encounter. She indicated preferring virtual for this encounter.   Reason COVID-19*  Social distancing based on CDC and AMA recommendations.   I contacted Lauren Greene on 03/02/2020 via telephone.      I clearly identified myself as Gillis Santa, MD. I verified that I was speaking with the correct person using two identifiers (Name: Lauren Greene, and date of birth: December 29, 1965).  Consent I sought verbal advanced consent from Lauren Greene for virtual visit interactions. I informed Lauren Greene of possible security and privacy concerns, risks, and limitations associated with providing "not-in-person" medical evaluation and management services. I also informed Lauren Greene of the availability of "in-person" appointments. Finally, I informed her that  there would be a charge for the virtual visit and that she could be  personally, fully or partially, financially responsible for it. Lauren Greene expressed understanding and agreed to proceed.   Historic Elements   Lauren Greene is a 54 y.o. year old, female patient evaluated today after our last contact on 11/23/2019. Lauren Greene  has a past medical history of ADHD (attention deficit hyperactivity disorder), Arthritis, Back pain, Broken femur ([place]) (08/27/12), Chronic pain, GERD (gastroesophageal reflux disease), Migraine, Osteoarthritis, Raynaud disease, and Raynaud phenomenon (04/06/2018). She also  has a past surgical history that includes Total knee arthroplasty; Heel spur surgery; Back surgery; IM nailing femoral shaft fracture (Left, 08/27/2012); Femur IM nail (Left, 08/27/2012); Cervical biopsy w/ loop electrode excision (1990's); Endometrial biopsy (04-05-05 and 02-12-06); Tubal ligation; Shoulder surgery (Left); Vaginal hysterectomy; Foot arthrodesis (Right, 02/01/2020); and Achilles tendon lengthening (Right, 02/01/2020). Lauren Greene has a current medication list which includes the following prescription(s): amlodipine, amphetamine-dextroamphetamine, amphetamine-dextroamphetamine, amphetamine-dextroamphetamine, diclofenac sodium cr, duloxetine, lidocaine, methocarbamol, nystatin, omeprazole, oxybutynin chloride, [START ON 03/19/2020] oxycodone-acetaminophen, trazodone, [START ON 04/18/2020] oxycodone-acetaminophen, and [START ON 05/18/2020] oxycodone-acetaminophen. She  reports that she quit smoking about 19 years ago. Her smoking use included cigarettes. She quit after 10.00 years of use. She has never used smokeless tobacco. She reports current alcohol use. She reports that she does not use drugs. Lauren Greene is allergic to wellbutrin [bupropion] and lyrica [pregabalin].   HPI  Today, she is being contacted for medication management. virtual visit today as patient in cast after surgery below and  unable to drive.  S/p on 11/16  TRIPLE ARTHRODESIS, TENDO ACHILLES LENGTHENING (Right Foot)  ACHILLES TENDON LENGTHENING (Right Foot)    Is recovering from surgery, right foot is in cast and will stay in cast for another 4 weeks.  Pain well managed on current regimen.   Pharmacotherapy Assessment  Analgesic: Percocet 10 mg 4 times daily as needed, quantity 120/month MME equals 60   02/18/2020  11/23/2019   1  Oxycodone-Acetaminophen 10-325  120.00  30  Bi Lat  [id number]  Car (9744)  0/0  60.00 MME  Comm Ins  [employer]      Monitoring: [place] PMP: PDMP reviewed during this encounter.       Pharmacotherapy: No side-effects or adverse reactions reported. Compliance: No problems identified. Effectiveness: Clinically acceptable. Plan: Refer to "POC".  UDS:  Summary  Date Value Ref Range Status  08/23/2019 Note  Final    Comment:    ==================================================================== [employer] [facility] 13 (MW) ==================================================================== Test                             Result       Flag       Units  Drug Present and Declared for Prescription Verification   Amphetamine                    >2890        EXPECTED   ng/mg creat    Amphetamine is available as a schedule II prescription drug.    Oxycodone                      >2890        EXPECTED   ng/mg creat   Oxymorphone                    >2890        EXPECTED   ng/mg creat   Noroxycodone                   >2890        EXPECTED   ng/mg creat   Noroxymorphone                 631          EXPECTED   ng/mg creat    Sources of oxycodone are scheduled prescription medications.    Oxymorphone, noroxycodone, and noroxymorphone are expected    metabolites of oxycodone. Oxymorphone is also available as a    scheduled prescription medication.  ==================================================================== Test                      Result    Flag   Units      Ref Range   Creatinine               346              mg/dL      >=20 ==================================================================== Declared Medications:  The flagging and interpretation on this report are based on the  following declared medications.  Unexpected results may arise from  inaccuracies in the declared medications.   **Note: The testing scope of this panel includes these medications:   Amphetamine (Adderall)  Oxycodone (Percocet)   **Note: The testing scope of this panel does not include the  following reported medications:   Acetaminophen (Percocet)  Amlodipine (Norvasc)  Diclofenac  Duloxetine (Cymbalta)  Lidocaine (Xylocaine)  Nystatin (Mycostatin)  Omeprazole (Prilosec)  Oxybutynin  Trazodone (Desyrel) ==================================================================== For clinical consultation, please call [phone]. ====================================================================     Laboratory Chemistry Profile   Renal Lab Results  Component Value Date   BUN 13 08/30/2019  CREATININE 0.66 08/30/2019   BCR 20 08/30/2019   GFRAA 115 08/30/2019   GFRNONAA 100 08/30/2019     Hepatic Lab Results  Component Value Date   AST 26 03/15/2017   ALT 26 03/15/2017   ALBUMIN 4.3 03/15/2017   ALKPHOS 117 03/15/2017   LIPASE 25 01/12/2016     Electrolytes Lab Results  Component Value Date   NA 141 08/30/2019   K 5.2 08/30/2019   CL 105 08/30/2019   CALCIUM 9.2 08/30/2019     Bone Lab Results  Component Value Date   VD25OH 31 01/07/2013     Inflammation (CRP: Acute Phase) (ESR: Chronic Phase) Lab Results  Component Value Date   CRP 1.7 08/03/2014   ESRSEDRATE 4 04/06/2018       Note: Above Lab results reviewed.  Imaging  DG C-Arm 1-60 Min CLINICAL DATA:  ORIF right foot  EXAM: RIGHT FOOT COMPLETE - 3+ VIEW; DG C-ARM 1-60 MIN  COMPARISON:  CT right foot 11/09/2019  FINDINGS: Subtalar arthrodesis. Arthrodesis of the talonavicular  joint. Arthrodesis of the calcaneocuboid joint with plate and screws.  IMPRESSION: Hindfoot arthrodesis.  Electronically Signed   By: Franchot Gallo M.D.   On: 02/01/2020 16:18 DG Foot Complete Right CLINICAL DATA:  ORIF right foot  EXAM: RIGHT FOOT COMPLETE - 3+ VIEW; DG C-ARM 1-60 MIN  COMPARISON:  CT right foot 11/09/2019  FINDINGS: Subtalar arthrodesis. Arthrodesis of the talonavicular joint. Arthrodesis of the calcaneocuboid joint with plate and screws.  IMPRESSION: Hindfoot arthrodesis.  Electronically Signed   By: Franchot Gallo M.D.   On: 02/01/2020 16:18  Assessment  The primary encounter diagnosis was Chronic pain syndrome. Diagnoses of Lumbar facet arthropathy, Lumbar spondylosis, Lumbar radiculopathy, Chronic radicular lumbar pain, Spinal stenosis, lumbar region, with neurogenic claudication, Long term current use of opiate analgesic, and Primary osteoarthritis of right ankle were also pertinent to this visit.  Plan of Care  Ms. CHERILYN SAUTTER has a current medication list which includes the following long-term medication(s): amlodipine, amphetamine-dextroamphetamine, amphetamine-dextroamphetamine, amphetamine-dextroamphetamine, duloxetine, omeprazole, [START ON 03/19/2020] oxycodone-acetaminophen, trazodone, [START ON 04/18/2020] oxycodone-acetaminophen, and [START ON 05/18/2020] oxycodone-acetaminophen.  Pharmacotherapy (Medications Ordered): Meds ordered this encounter  Medications  . oxyCODONE-acetaminophen (PERCOCET) 10-325 MG tablet    Sig: Take 1 tablet by mouth every 6 (six) hours as needed for pain. Must last 30 days.    Dispense:  120 tablet    Refill:  0    Chronic Pain. (STOP Act - Not applicable). Fill one day early if closed on scheduled refill date.  Marland Kitchen oxyCODONE-acetaminophen (PERCOCET) 10-325 MG tablet    Sig: Take 1 tablet by mouth every 6 (six) hours as needed for pain. Must last 30 days.    Dispense:  120 tablet    Refill:  0    Chronic Pain.  (STOP Act - Not applicable). Fill one day early if closed on scheduled refill date.  Marland Kitchen oxyCODONE-acetaminophen (PERCOCET) 10-325 MG tablet    Sig: Take 1 tablet by mouth every 6 (six) hours as needed for pain. Must last 30 days.    Dispense:  120 tablet    Refill:  0    Chronic Pain. (STOP Act - Not applicable). Fill one day early if closed on scheduled refill date.   Continue cymbalta and robaxin as prescribed.  Follow-up plan:   Return in about 14 weeks (around 06/08/2020) for Medication Management, in person.   Recent Visits No visits were found meeting these conditions. Showing recent visits within past  90 days and meeting all other requirements Today's Visits Date Type Provider Dept  03/02/20 Telemedicine Gillis Santa, MD [facility]  Showing today's visits and meeting all other requirements Future Appointments No visits were found meeting these conditions. Showing future appointments within next 90 days and meeting all other requirements  I discussed the assessment and treatment plan with the patient. The patient was provided an opportunity to ask questions and all were answered. The patient agreed with the plan and demonstrated an understanding of the instructions.  Patient advised to call back or seek an in-person evaluation if the symptoms or condition worsens.  Duration of encounter: 30 minutes.  Note by: Gillis Santa, MD Date: 03/02/2020; Time: 3:16 PM

## 2020-03-16 ENCOUNTER — Other Ambulatory Visit: Payer: Self-pay | Admitting: Obstetrics & Gynecology

## 2020-03-16 NOTE — Telephone Encounter (Signed)
The patient has not been seen in this office for almost 3 years. She needs an appointment prior to any refills.

## 2020-03-20 ENCOUNTER — Other Ambulatory Visit: Payer: Self-pay | Admitting: Family Medicine

## 2020-03-20 MED ORDER — AMPHETAMINE-DEXTROAMPHET ER 30 MG PO CP24
ORAL_CAPSULE | ORAL | 0 refills | Status: DC
Start: 2020-03-20 — End: 2020-06-12

## 2020-03-20 NOTE — Telephone Encounter (Signed)
May have 6 months on her diclofenac As for the ADD medicine this is a duplicate I sent in the other medications today

## 2020-03-29 DIAGNOSIS — Z9889 Other specified postprocedural states: Secondary | ICD-10-CM | POA: Diagnosis not present

## 2020-04-04 ENCOUNTER — Telehealth: Payer: Self-pay | Admitting: Family Medicine

## 2020-04-04 NOTE — Telephone Encounter (Signed)
Form faxed over from pharmacy regarding pt Adderall XR 30 mg. There is a quantity limitation on meds. Please provide information regarding why the patient requires a greater quantity than set. Form in provider office. Please advise. Thank you.

## 2020-04-06 NOTE — Telephone Encounter (Signed)
Has this been sent up front?

## 2020-04-06 NOTE — Telephone Encounter (Signed)
I filled out this authorization form as requested thank you

## 2020-04-07 NOTE — Telephone Encounter (Signed)
Paper was faxed. Thank you

## 2020-04-11 ENCOUNTER — Encounter: Payer: Self-pay | Admitting: Family Medicine

## 2020-04-11 ENCOUNTER — Other Ambulatory Visit: Payer: Self-pay | Admitting: *Deleted

## 2020-04-11 MED ORDER — LIDOCAINE 5 % EX OINT
TOPICAL_OINTMENT | CUTANEOUS | 4 refills | Status: DC
Start: 2020-04-11 — End: 2021-02-23

## 2020-04-11 NOTE — Telephone Encounter (Signed)
Last seen December 2021

## 2020-04-11 NOTE — Telephone Encounter (Signed)
Nurses made due four refills please

## 2020-04-18 ENCOUNTER — Other Ambulatory Visit: Payer: Self-pay | Admitting: Family Medicine

## 2020-05-16 DIAGNOSIS — M79671 Pain in right foot: Secondary | ICD-10-CM | POA: Diagnosis not present

## 2020-05-16 DIAGNOSIS — Z981 Arthrodesis status: Secondary | ICD-10-CM | POA: Diagnosis not present

## 2020-05-25 DIAGNOSIS — Z981 Arthrodesis status: Secondary | ICD-10-CM | POA: Diagnosis not present

## 2020-05-25 DIAGNOSIS — M79671 Pain in right foot: Secondary | ICD-10-CM | POA: Diagnosis not present

## 2020-05-26 DIAGNOSIS — M79671 Pain in right foot: Secondary | ICD-10-CM | POA: Diagnosis not present

## 2020-05-29 ENCOUNTER — Other Ambulatory Visit: Payer: Self-pay | Admitting: Family Medicine

## 2020-05-30 ENCOUNTER — Other Ambulatory Visit: Payer: Self-pay | Admitting: Family Medicine

## 2020-06-08 ENCOUNTER — Ambulatory Visit
Payer: PPO | Attending: Student in an Organized Health Care Education/Training Program | Admitting: Student in an Organized Health Care Education/Training Program

## 2020-06-08 ENCOUNTER — Encounter: Payer: Self-pay | Admitting: Student in an Organized Health Care Education/Training Program

## 2020-06-08 ENCOUNTER — Other Ambulatory Visit: Payer: Self-pay

## 2020-06-08 VITALS — BP 146/91 | HR 74 | Temp 98.2°F | Resp 16 | Ht 68.0 in | Wt 240.0 lb

## 2020-06-08 DIAGNOSIS — M48062 Spinal stenosis, lumbar region with neurogenic claudication: Secondary | ICD-10-CM | POA: Insufficient documentation

## 2020-06-08 DIAGNOSIS — M19071 Primary osteoarthritis, right ankle and foot: Secondary | ICD-10-CM | POA: Diagnosis not present

## 2020-06-08 DIAGNOSIS — G8929 Other chronic pain: Secondary | ICD-10-CM | POA: Insufficient documentation

## 2020-06-08 DIAGNOSIS — G894 Chronic pain syndrome: Secondary | ICD-10-CM | POA: Insufficient documentation

## 2020-06-08 DIAGNOSIS — M47816 Spondylosis without myelopathy or radiculopathy, lumbar region: Secondary | ICD-10-CM | POA: Diagnosis not present

## 2020-06-08 DIAGNOSIS — M5416 Radiculopathy, lumbar region: Secondary | ICD-10-CM | POA: Diagnosis not present

## 2020-06-08 DIAGNOSIS — Z79891 Long term (current) use of opiate analgesic: Secondary | ICD-10-CM | POA: Insufficient documentation

## 2020-06-08 DIAGNOSIS — M5136 Other intervertebral disc degeneration, lumbar region: Secondary | ICD-10-CM | POA: Diagnosis not present

## 2020-06-08 MED ORDER — OXYCODONE-ACETAMINOPHEN 10-325 MG PO TABS: 1.0000 | ORAL_TABLET | Freq: Four times a day (QID) | ORAL | 0 refills | Status: DC | PRN

## 2020-06-08 NOTE — Progress Notes (Signed)
PROVIDER NOTE: Information contained herein reflects review and annotations entered in association with encounter. Interpretation of such information and data should be left to medically-trained personnel. Information provided to patient can be located elsewhere in the medical record under "Patient Instructions". Document created using STT-dictation technology, any transcriptional errors that may result from process are unintentional.    Patient: Lauren Greene  Service Category: E/M  Provider: Gillis Santa, MD  DOB: 1965/11/04  DOS: 06/08/2020  Specialty: Interventional Pain Management  MRN: [id number]  Setting: Ambulatory outpatient  PCP: Kathyrn Drown, MD  Type: Established Patient    Referring Provider: Kathyrn Drown, MD  Location: Office  Delivery: Face-to-face     HPI  [name], a 55 y.o. year old female, is here today because of her Chronic pain syndrome [G89.4]. [name] primary complain today is Back Pain (lower)  Pertinent problems: [name] has Periprosthetic fracture around internal prosthetic left knee joint; Chronic back pain; Morbid obesity ([place]); Lumbar degenerative disc disease; Chronic, continuous use of opioids; Fibromyalgia; Lumbar spondylosis; Chronic pain syndrome; and Encounter for long-term opiate analgesic use on their pertinent problem list. Pain Assessment: Severity of Chronic pain is reported as a 7 /10. Location: Back Lower/denies. Onset: More than a month ago. Quality: Sharp,Shooting. Timing: Constant. Modifying factor(s): meds. Vitals:  height is $RemoveB'5\' 8"'lUqLtxPk$  (1.727 m) and weight is 240 lb (108.9 kg). Her temporal temperature is 98.2 F (36.8 C). Her blood pressure is 146/91 (abnormal) and her pulse is 74. Her respiration is 16 and oxygen saturation is 99%.   Reason for encounter: medication management.    Having right revision surgery of right foot arthrodesis.  Having upcoming surgery on Tuesday. Otherwise will refill her chronic pain  medications. Discussed spinal cord stimulation with patient for her low back as well as her right foot pain.  I encouraged her to discuss this with her orthopedic surgeon as well. I will refill her Percocet for the next 3 months for chronic pain syndrome.   Pharmacotherapy Assessment   Analgesic: Percocet 10 mg 4 times daily as needed, quantity 120/month MME equals 60   Monitoring: [place] PMP: PDMP reviewed during this encounter.       Pharmacotherapy: No side-effects or adverse reactions reported. Compliance: No problems identified. Effectiveness: Clinically acceptable.  Rise Patience, RN  06/08/2020  2:25 PM  Sign when Signing Visit Nursing Pain Medication Assessment:  Safety precautions to be maintained throughout the outpatient stay will include: orient to surroundings, keep bed in low position, maintain call bell within reach at all times, provide assistance with transfer out of bed and ambulation.  Medication Inspection Compliance: Pill count conducted under aseptic conditions, in front of the patient. Neither the pills nor the bottle was removed from the patient's sight at any time. Once count was completed pills were immediately returned to the patient in their original bottle.  Medication: Oxycodone/APAP Pill/Patch Count: 41 of 120 pills remain Pill/Patch Appearance: Markings consistent with prescribed medication Bottle Appearance: Standard pharmacy container. Clearly labeled. Filled Date: 3 / 12 / 22 Last Medication intake:  Today    UDS:  Summary  Date Value Ref Range Status  08/23/2019 Note  Final    Comment:    ==================================================================== [employer] Select 13 (MW) ==================================================================== Test                             Result       Flag  Units  Drug Present and Declared for Prescription Verification   Amphetamine                    >2890        EXPECTED   ng/mg creat     Amphetamine is available as a schedule II prescription drug.    Oxycodone                      >2890        EXPECTED   ng/mg creat   Oxymorphone                    >2890        EXPECTED   ng/mg creat   Noroxycodone                   >2890        EXPECTED   ng/mg creat   Noroxymorphone                 631          EXPECTED   ng/mg creat    Sources of oxycodone are scheduled prescription medications.    Oxymorphone, noroxycodone, and noroxymorphone are expected    metabolites of oxycodone. Oxymorphone is also available as a    scheduled prescription medication.  ==================================================================== Test                      Result    Flag   Units      Ref Range   Creatinine              346              mg/dL      >=20 ==================================================================== Declared Medications:  The flagging and interpretation on this report are based on the  following declared medications.  Unexpected results may arise from  inaccuracies in the declared medications.   **Note: The testing scope of this panel includes these medications:   Amphetamine (Adderall)  Oxycodone (Percocet)   **Note: The testing scope of this panel does not include the  following reported medications:   Acetaminophen (Percocet)  Amlodipine (Norvasc)  Diclofenac  Duloxetine (Cymbalta)  Lidocaine (Xylocaine)  Nystatin (Mycostatin)  Omeprazole (Prilosec)  Oxybutynin  Trazodone (Desyrel) ==================================================================== For clinical consultation, please call [phone]. ====================================================================      ROS  Constitutional: Denies any fever or chills Gastrointestinal: No reported hemesis, hematochezia, vomiting, or acute GI distress Musculoskeletal: Low back, right ankle and foot pain Neurological: No reported episodes of acute onset apraxia, aphasia, dysarthria, agnosia,  amnesia, paralysis, loss of coordination, or loss of consciousness  Medication Review  DULoxetine, Diclofenac Sodium CR, Oxybutynin Chloride, amLODipine, amphetamine-dextroamphetamine, lidocaine, methocarbamol, nystatin, omeprazole, oxyCODONE-acetaminophen, and traZODone  History Review  Allergy: [name] is allergic to wellbutrin [bupropion] and lyrica [pregabalin]. Drug: [name]  reports no history of drug use. Alcohol:  reports current alcohol use. Tobacco:  reports that she quit smoking about 19 years ago. Her smoking use included cigarettes. She quit after 10.00 years of use. She has never used smokeless tobacco. Social: [name]  reports that she quit smoking about 19 years ago. Her smoking use included cigarettes. She quit after 10.00 years of use. She has never used smokeless tobacco. She reports current alcohol use. She reports that she does not use drugs. Medical:  has a past medical history of ADHD (attention deficit hyperactivity  disorder), Arthritis, Back pain, Broken femur ([place]) (08/27/12), Chronic pain, GERD (gastroesophageal reflux disease), Migraine, Osteoarthritis, Raynaud disease, and Raynaud phenomenon (04/06/2018). Surgical: [name]  has a past surgical history that includes Total knee arthroplasty; Heel spur surgery; Back surgery; IM nailing femoral shaft fracture (Left, 08/27/2012); Femur IM nail (Left, 08/27/2012); Cervical biopsy w/ loop electrode excision (1990's); Endometrial biopsy (04-05-05 and 02-12-06); Tubal ligation; Shoulder surgery (Left); Vaginal hysterectomy; Foot arthrodesis (Right, 02/01/2020); and Achilles tendon lengthening (Right, 02/01/2020). Family: family history includes Bipolar disorder in her mother and sister; Breast cancer in her mother; Cancer in her father, mother, and another family member; Heart failure in her father and another family member.  Laboratory Chemistry Profile   Renal Lab Results  Component Value Date   BUN 13  08/30/2019   CREATININE 0.66 08/30/2019   BCR 20 08/30/2019   GFRAA 115 08/30/2019   GFRNONAA 100 08/30/2019     Hepatic Lab Results  Component Value Date   AST 26 03/15/2017   ALT 26 03/15/2017   ALBUMIN 4.3 03/15/2017   ALKPHOS 117 03/15/2017   LIPASE 25 01/12/2016     Electrolytes Lab Results  Component Value Date   NA 141 08/30/2019   K 5.2 08/30/2019   CL 105 08/30/2019   CALCIUM 9.2 08/30/2019     Bone Lab Results  Component Value Date   VD25OH 31 01/07/2013     Inflammation (CRP: Acute Phase) (ESR: Chronic Phase) Lab Results  Component Value Date   CRP 1.7 08/03/2014   ESRSEDRATE 4 04/06/2018       Note: Above Lab results reviewed.  Recent Imaging Review  DG C-Arm 1-60 Min CLINICAL DATA:  ORIF right foot  EXAM: RIGHT FOOT COMPLETE - 3+ VIEW; DG C-ARM 1-60 MIN  COMPARISON:  CT right foot 11/09/2019  FINDINGS: Subtalar arthrodesis. Arthrodesis of the talonavicular joint. Arthrodesis of the calcaneocuboid joint with plate and screws.  IMPRESSION: Hindfoot arthrodesis.  Electronically Signed   By: Franchot Gallo M.D.   On: 02/01/2020 16:18 DG Foot Complete Right CLINICAL DATA:  ORIF right foot  EXAM: RIGHT FOOT COMPLETE - 3+ VIEW; DG C-ARM 1-60 MIN  COMPARISON:  CT right foot 11/09/2019  FINDINGS: Subtalar arthrodesis. Arthrodesis of the talonavicular joint. Arthrodesis of the calcaneocuboid joint with plate and screws.  IMPRESSION: Hindfoot arthrodesis.  Electronically Signed   By: Franchot Gallo M.D.   On: 02/01/2020 16:18 Note: Reviewed        Physical Exam  General appearance: Well nourished, well developed, and well hydrated. In no apparent acute distress Mental status: Alert, oriented x 3 (person, place, & time)       Respiratory: No evidence of acute respiratory distress Eyes: PERLA Vitals: BP (!) 146/91   Pulse 74   Temp 98.2 F (36.8 C) (Temporal)   Resp 16   Ht $R'5\' 8"'kV$  (1.727 m)   Wt 240 lb (108.9 kg)   LMP  03/18/2004   SpO2 99%   BMI 36.49 kg/m  BMI: Estimated body mass index is 36.49 kg/m as calculated from the following:   Height as of this encounter: $RemoveBeforeD'5\' 8"'kFFeafrgxLhHwU$  (1.727 m).   Weight as of this encounter: 240 lb (108.9 kg). Ideal: Ideal body weight: 63.9 kg (140 lb 14 oz) Adjusted ideal body weight: 81.9 kg (180 lb 8.4 oz)   Lumbar Spine Area Exam  Skin & Axial Inspection:No masses, redness, or swelling Alignment:Symmetrical Functional UVO:ZDGU restricted ROMaffecting both sides Stability:No instability detected Muscle Tone/Strength:Functionally intact. No obvious neuro-muscular anomalies  detected. Sensory (Neurological):Musculoskeletal pain pattern  Lower Extremity Exam    Side:Right lower extremity  Side:Left lower extremity  Stability: Right ankle eversion  Stability:No instability observed  Skin & Extremity Inspection:Skin color, temperature, and hair growth are WNL. No peripheral edema or cyanosis. No masses, redness, swelling, asymmetry, or associated skin lesions. No contractures.  Skin & Extremity Inspection:Skin color, temperature, and hair growth are WNL. No peripheral edema or cyanosis. No masses, redness, swelling, asymmetry, or associated skin lesions. No contractures.  Functional ONG:EXBM restricted ROMfor hip and knee joints, right ankle pain with right ankle eversion.   Functional WUX:LKGM restricted ROMfor hip and knee joints   Muscle Tone/Strength:Functionally intact. No obvious neuro-muscular anomalies detected.  Muscle Tone/Strength:Functionally intact. No obvious neuro-muscular anomalies detected.  Sensory (Neurological):Arthropathic arthralgia  Sensory (Neurological):Arthropathic arthralgia  DTR: Patellar:deferred today Achilles:deferred today Plantar:deferred today  DTR: Patellar:deferred today Achilles:deferred today Plantar:deferred today  Palpation:No palpable anomalies   Palpation:No palpable anomalies    Assessment   Status Diagnosis  Controlled Controlled Controlled 1. Chronic pain syndrome   2. Lumbar facet arthropathy   3. Lumbar spondylosis   4. Lumbar radiculopathy   5. Chronic radicular lumbar pain   6. Spinal stenosis, lumbar region, with neurogenic claudication   7. Long term current use of opiate analgesic   8. Primary osteoarthritis of right ankle   9. Encounter for long-term opiate analgesic use   10. Lumbar degenerative disc disease      Plan of Care   [name] has a current medication list which includes the following long-term medication(s): amlodipine, amphetamine-dextroamphetamine, amphetamine-dextroamphetamine, amphetamine-dextroamphetamine, duloxetine, omeprazole, [START ON 06/28/2020] oxycodone-acetaminophen, [START ON 07/28/2020] oxycodone-acetaminophen, [START ON 08/27/2020] oxycodone-acetaminophen, and trazodone.  Pharmacotherapy (Medications Ordered): Meds ordered this encounter  Medications  . oxyCODONE-acetaminophen (PERCOCET) 10-325 MG tablet    Sig: Take 1 tablet by mouth every 6 (six) hours as needed for pain. Must last 30 days.    Dispense:  120 tablet    Refill:  0    Chronic Pain. (STOP Act - Not applicable). Fill one day early if closed on scheduled refill date.  Marland Kitchen oxyCODONE-acetaminophen (PERCOCET) 10-325 MG tablet    Sig: Take 1 tablet by mouth every 6 (six) hours as needed for pain. Must last 30 days.    Dispense:  120 tablet    Refill:  0    Chronic Pain. (STOP Act - Not applicable). Fill one day early if closed on scheduled refill date.  Marland Kitchen oxyCODONE-acetaminophen (PERCOCET) 10-325 MG tablet    Sig: Take 1 tablet by mouth every 6 (six) hours as needed for pain. Must last 30 days.    Dispense:  120 tablet    Refill:  0    Chronic Pain. (STOP Act - Not applicable). Fill one day early if closed on scheduled refill date.   Orders:  Orders Placed This Encounter  Procedures  . [employer] Select  13 (MW), Urine    Volume: 30 ml(s). Minimum 3 ml of urine is needed. Document temperature of fresh sample. Indications: Long term (current) use of opiate analgesic [phone])    Order Specific Question:   Release to patient    Answer:   Immediate   Follow-up plan:   Return in about 3 months (around 09/08/2020) for Medication Management, in person.   Recent Visits No visits were found meeting these conditions. Showing recent visits within past 90 days and meeting all other requirements Today's Visits Date Type Provider Dept  06/08/20 Office Visit Gillis Santa, MD  [facility]  Showing today's visits and meeting all other requirements Future Appointments No visits were found meeting these conditions. Showing future appointments within next 90 days and meeting all other requirements  I discussed the assessment and treatment plan with the patient. The patient was provided an opportunity to ask questions and all were answered. The patient agreed with the plan and demonstrated an understanding of the instructions.  Patient advised to call back or seek an in-person evaluation if the symptoms or condition worsens.  Duration of encounter: 30 minutes.  Note by: Gillis Santa, MD Date: 06/08/2020; Time: 2:59 PM

## 2020-06-08 NOTE — Progress Notes (Signed)
Nursing Pain Medication Assessment:  Safety precautions to be maintained throughout the outpatient stay will include: orient to surroundings, keep bed in low position, maintain call bell within reach at all times, provide assistance with transfer out of bed and ambulation.  Medication Inspection Compliance: Pill count conducted under aseptic conditions, in front of the patient. Neither the pills nor the bottle was removed from the patient's sight at any time. Once count was completed pills were immediately returned to the patient in their original bottle.  Medication: Oxycodone/APAP Pill/Patch Count: 41 of 120 pills remain Pill/Patch Appearance: Markings consistent with prescribed medication Bottle Appearance: Standard pharmacy container. Clearly labeled. Filled Date: 3 / 3 / 22 Last Medication intake:  Today

## 2020-06-08 NOTE — Patient Instructions (Signed)
Handouts/information given on [employer]. ([employer])

## 2020-06-12 ENCOUNTER — Ambulatory Visit (INDEPENDENT_AMBULATORY_CARE_PROVIDER_SITE_OTHER): Payer: PPO | Admitting: Family Medicine

## 2020-06-12 ENCOUNTER — Other Ambulatory Visit: Payer: Self-pay

## 2020-06-12 VITALS — BP 130/80 | HR 70 | Temp 98.6°F | Ht 68.0 in | Wt 247.0 lb

## 2020-06-12 DIAGNOSIS — M5416 Radiculopathy, lumbar region: Secondary | ICD-10-CM

## 2020-06-12 DIAGNOSIS — M48062 Spinal stenosis, lumbar region with neurogenic claudication: Secondary | ICD-10-CM

## 2020-06-12 DIAGNOSIS — Z1211 Encounter for screening for malignant neoplasm of colon: Secondary | ICD-10-CM | POA: Diagnosis not present

## 2020-06-12 DIAGNOSIS — Z79891 Long term (current) use of opiate analgesic: Secondary | ICD-10-CM

## 2020-06-12 DIAGNOSIS — G8929 Other chronic pain: Secondary | ICD-10-CM | POA: Diagnosis not present

## 2020-06-12 DIAGNOSIS — M791 Myalgia, unspecified site: Secondary | ICD-10-CM | POA: Diagnosis not present

## 2020-06-12 DIAGNOSIS — M1990 Unspecified osteoarthritis, unspecified site: Secondary | ICD-10-CM | POA: Diagnosis not present

## 2020-06-12 DIAGNOSIS — E785 Hyperlipidemia, unspecified: Secondary | ICD-10-CM

## 2020-06-12 DIAGNOSIS — F988 Other specified behavioral and emotional disorders with onset usually occurring in childhood and adolescence: Secondary | ICD-10-CM | POA: Diagnosis not present

## 2020-06-12 DIAGNOSIS — G894 Chronic pain syndrome: Secondary | ICD-10-CM | POA: Diagnosis not present

## 2020-06-12 MED ORDER — AMPHETAMINE-DEXTROAMPHET ER 30 MG PO CP24: ORAL_CAPSULE | ORAL | 0 refills | Status: DC

## 2020-06-12 NOTE — Progress Notes (Signed)
Subjective:    Patient ID: Lauren Greene, female    DOB: [birth year], 55 y.o.   MRN: [id number]  HPI Patient is here today for follow up on her ADHD medications Adult ADD Takes her medicine on a regular basis States it allows her to focus better and follow through better Currently debilitated because of the need for foot surgery which she is having tomorrow She suffers with a lot of arthralgias muscle aches pains and discomforts. She had seen a rheumatologist in the past who did a work-up and told her it was not inflammatory Patient somewhat discouraged about her overall health but she is trying hard very nice patient  Myalgia - Plan: Antinuclear Antib (ANA), Comprehensive metabolic panel, Hepatic function panel, CK, C-reactive protein, Tissue Transglutaminase, IGA  Encounter for screening colonoscopy - Plan: Cologuard  Attention deficit disorder, unspecified hyperactivity presence  Osteoarthritis, unspecified osteoarthritis type, unspecified site - Plan: Comprehensive metabolic panel, Hepatic function panel, CK, C-reactive protein, Tissue Transglutaminase, IGA  Hyperlipidemia, unspecified hyperlipidemia type - Plan: Lipid panel, Comprehensive metabolic panel, Hepatic function panel, CK, C-reactive protein, Tissue Transglutaminase, IGA  Chronic pain syndrome  Chronic radicular lumbar pain  Spinal stenosis, lumbar region, with neurogenic claudication  Encounter for long-term opiate analgesic use    Review of Systems  Constitutional: Negative for activity change and appetite change.  HENT: Negative for congestion and rhinorrhea.   Respiratory: Negative for cough and shortness of breath.   Cardiovascular: Negative for chest pain and leg swelling.  Gastrointestinal: Negative for abdominal pain, nausea and vomiting.  Musculoskeletal: Positive for arthralgias and back pain.  Skin: Negative for color change.  Neurological: Negative for dizziness and weakness.   Psychiatric/Behavioral: Negative for agitation and confusion.       Objective:   Physical Exam Vitals reviewed.  Constitutional:      General: She is not in acute distress. HENT:     Head: Normocephalic.  Cardiovascular:     Rate and Rhythm: Normal rate and regular rhythm.     Heart sounds: Normal heart sounds. No murmur heard.   Pulmonary:     Effort: Pulmonary effort is normal.     Breath sounds: Normal breath sounds.  Lymphadenopathy:     Cervical: No cervical adenopathy.  Neurological:     Mental Status: She is alert.  Psychiatric:        Behavior: Behavior normal.           Assessment & Plan:  1. Encounter for screening colonoscopy Cologuard ordered - Cologuard  2. Myalgia Muscle aches and discomfort check lab work to rule out immune issue - Antinuclear Antib (ANA) - Comprehensive metabolic panel - Hepatic function panel - CK - C-reactive protein - Tissue Transglutaminase, IGA  3. Attention deficit disorder, unspecified hyperactivity presence Actually doing well with the medicine blood pressure doing good current dosing is helping her we will stick with that 3 scripts were given  4. Osteoarthritis, unspecified osteoarthritis type, unspecified site Tylenol recommended.  We are going ahead with checking some lab work - Comprehensive metabolic panel - Hepatic function panel - CK - C-reactive protein - Tissue Transglutaminase, IGA  5. Hyperlipidemia, unspecified hyperlipidemia type Lab work ordered. - Lipid panel - Comprehensive metabolic panel - Hepatic function panel healthy diet recommended - CK - C-reactive protein - Tissue Transglutaminase, IGA  6. Chronic pain syndrome Followed by pain management  7. Chronic radicular lumbar pain Followed by pain management  8. Spinal stenosis, lumbar region, with neurogenic claudication Followed by pain  management  9. Encounter for long-term opiate analgesic use Followed by pain management

## 2020-06-13 DIAGNOSIS — T84098A Other mechanical complication of other internal joint prosthesis, initial encounter: Secondary | ICD-10-CM | POA: Diagnosis not present

## 2020-06-13 DIAGNOSIS — G8918 Other acute postprocedural pain: Secondary | ICD-10-CM | POA: Diagnosis not present

## 2020-06-13 DIAGNOSIS — M960 Pseudarthrosis after fusion or arthrodesis: Secondary | ICD-10-CM | POA: Diagnosis not present

## 2020-06-14 LAB — TOXASSURE SELECT 13 (MW), URINE

## 2020-06-26 DIAGNOSIS — Z1211 Encounter for screening for malignant neoplasm of colon: Secondary | ICD-10-CM | POA: Diagnosis not present

## 2020-06-29 LAB — COLOGUARD
COLOGUARD: NEGATIVE
Cologuard: NEGATIVE

## 2020-07-05 ENCOUNTER — Encounter: Payer: Self-pay | Admitting: Family Medicine

## 2020-07-05 DIAGNOSIS — Z9889 Other specified postprocedural states: Secondary | ICD-10-CM | POA: Diagnosis not present

## 2020-07-13 ENCOUNTER — Other Ambulatory Visit: Payer: Self-pay | Admitting: Family Medicine

## 2020-07-19 ENCOUNTER — Other Ambulatory Visit: Payer: Self-pay | Admitting: Family Medicine

## 2020-07-27 ENCOUNTER — Other Ambulatory Visit: Payer: Self-pay

## 2020-07-27 ENCOUNTER — Ambulatory Visit
Payer: PPO | Attending: Student in an Organized Health Care Education/Training Program | Admitting: Student in an Organized Health Care Education/Training Program

## 2020-07-27 NOTE — Progress Notes (Signed)
I attempted to call the patient however no response. Voicemail left instructing patient to call front desk office at [phone] to reschedule appointment. -Dr Wellington Winegarden  

## 2020-08-04 DIAGNOSIS — M79671 Pain in right foot: Secondary | ICD-10-CM | POA: Diagnosis not present

## 2020-08-04 DIAGNOSIS — Z981 Arthrodesis status: Secondary | ICD-10-CM | POA: Diagnosis not present

## 2020-08-08 DIAGNOSIS — Z029 Encounter for administrative examinations, unspecified: Secondary | ICD-10-CM

## 2020-08-08 DIAGNOSIS — M19012 Primary osteoarthritis, left shoulder: Secondary | ICD-10-CM | POA: Diagnosis not present

## 2020-08-08 DIAGNOSIS — M19011 Primary osteoarthritis, right shoulder: Secondary | ICD-10-CM | POA: Diagnosis not present

## 2020-08-16 DIAGNOSIS — Z981 Arthrodesis status: Secondary | ICD-10-CM | POA: Diagnosis not present

## 2020-08-16 DIAGNOSIS — M79671 Pain in right foot: Secondary | ICD-10-CM | POA: Diagnosis not present

## 2020-08-16 DIAGNOSIS — Z9889 Other specified postprocedural states: Secondary | ICD-10-CM | POA: Diagnosis not present

## 2020-08-16 DIAGNOSIS — M722 Plantar fascial fibromatosis: Secondary | ICD-10-CM | POA: Diagnosis not present

## 2020-08-21 DIAGNOSIS — Z981 Arthrodesis status: Secondary | ICD-10-CM | POA: Diagnosis not present

## 2020-08-21 DIAGNOSIS — M79671 Pain in right foot: Secondary | ICD-10-CM | POA: Diagnosis not present

## 2020-08-31 ENCOUNTER — Other Ambulatory Visit: Payer: Self-pay | Admitting: Family Medicine

## 2020-09-06 DIAGNOSIS — T691XXD Chilblains, subsequent encounter: Secondary | ICD-10-CM | POA: Diagnosis not present

## 2020-09-06 DIAGNOSIS — Z6836 Body mass index (BMI) 36.0-36.9, adult: Secondary | ICD-10-CM | POA: Diagnosis not present

## 2020-09-06 DIAGNOSIS — M150 Primary generalized (osteo)arthritis: Secondary | ICD-10-CM | POA: Diagnosis not present

## 2020-09-06 DIAGNOSIS — E669 Obesity, unspecified: Secondary | ICD-10-CM | POA: Diagnosis not present

## 2020-09-06 DIAGNOSIS — G894 Chronic pain syndrome: Secondary | ICD-10-CM | POA: Diagnosis not present

## 2020-09-06 DIAGNOSIS — R5383 Other fatigue: Secondary | ICD-10-CM | POA: Diagnosis not present

## 2020-09-06 DIAGNOSIS — I7300 Raynaud's syndrome without gangrene: Secondary | ICD-10-CM | POA: Diagnosis not present

## 2020-09-06 DIAGNOSIS — M064 Inflammatory polyarthropathy: Secondary | ICD-10-CM | POA: Diagnosis not present

## 2020-09-07 ENCOUNTER — Ambulatory Visit
Payer: PPO | Attending: Student in an Organized Health Care Education/Training Program | Admitting: Student in an Organized Health Care Education/Training Program

## 2020-09-07 ENCOUNTER — Encounter: Payer: Self-pay | Admitting: Student in an Organized Health Care Education/Training Program

## 2020-09-07 ENCOUNTER — Other Ambulatory Visit: Payer: Self-pay

## 2020-09-07 VITALS — BP 137/69 | HR 78 | Temp 96.9°F | Resp 18 | Ht 68.0 in | Wt 241.0 lb

## 2020-09-07 DIAGNOSIS — M5136 Other intervertebral disc degeneration, lumbar region: Secondary | ICD-10-CM

## 2020-09-07 DIAGNOSIS — G8929 Other chronic pain: Secondary | ICD-10-CM

## 2020-09-07 DIAGNOSIS — Z79891 Long term (current) use of opiate analgesic: Secondary | ICD-10-CM

## 2020-09-07 DIAGNOSIS — M961 Postlaminectomy syndrome, not elsewhere classified: Secondary | ICD-10-CM | POA: Diagnosis not present

## 2020-09-07 DIAGNOSIS — M5416 Radiculopathy, lumbar region: Secondary | ICD-10-CM

## 2020-09-07 DIAGNOSIS — Z9889 Other specified postprocedural states: Secondary | ICD-10-CM | POA: Diagnosis not present

## 2020-09-07 DIAGNOSIS — M48062 Spinal stenosis, lumbar region with neurogenic claudication: Secondary | ICD-10-CM

## 2020-09-07 DIAGNOSIS — G894 Chronic pain syndrome: Secondary | ICD-10-CM | POA: Diagnosis not present

## 2020-09-07 MED ORDER — OXYCODONE-ACETAMINOPHEN 10-325 MG PO TABS
1.0000 | ORAL_TABLET | Freq: Four times a day (QID) | ORAL | 0 refills | Status: DC | PRN
Start: 2020-12-06 — End: 2020-12-26

## 2020-09-07 MED ORDER — OXYCODONE-ACETAMINOPHEN 10-325 MG PO TABS: 1.0000 | ORAL_TABLET | Freq: Four times a day (QID) | ORAL | 0 refills | Status: DC | PRN

## 2020-09-07 NOTE — Progress Notes (Signed)
Nursing Pain Medication Assessment:  Safety precautions to be maintained throughout the outpatient stay will include: orient to surroundings, keep bed in low position, maintain call bell within reach at all times, provide assistance with transfer out of bed and ambulation.  Medication Inspection Compliance: Pill count conducted under aseptic conditions, in front of the patient. Neither the pills nor the bottle was removed from the patient's sight at any time. Once count was completed pills were immediately returned to the patient in their original bottle.  Medication: Oxycodone/APAP Pill/Patch Count:  120 of 120 pills remain Pill/Patch Appearance: Markings consistent with prescribed medication Bottle Appearance: Standard pharmacy container. Clearly labeled. Filled Date: 06 / 23 / 2022 Last Medication intake:  Today

## 2020-09-07 NOTE — Patient Instructions (Signed)
Please call us for a face to face visit after you've completed your T+L MRI and psych eval  Please have them fax psych eval to your clinic

## 2020-09-07 NOTE — Progress Notes (Signed)
PROVIDER NOTE: Information contained herein reflects review and annotations entered in association with encounter. Interpretation of such information and data should be left to medically-trained personnel. Information provided to patient can be located elsewhere in the medical record under "Patient Instructions". Document created using STT-dictation technology, any transcriptional errors that may result from process are unintentional.    Patient: Lauren Greene  Service Category: E/M  Provider: Gillis Santa, MD  DOB: Nov 27, 1965  DOS: 09/07/2020  Specialty: Interventional Pain Management  MRN: [id number]  Setting: Ambulatory outpatient  PCP: Kathyrn Drown, MD  Type: Established Patient    Referring Provider: Kathyrn Drown, MD  Location: Office  Delivery: Face-to-face     HPI  [name], a 55 y.o. year old female, is here today because of her Chronic pain syndrome [G89.4]. [name] primary complain today is Back Pain (low)  Pertinent problems: [name] has Periprosthetic fracture around internal prosthetic left knee joint; Chronic back pain; Morbid obesity ([place]); Lumbar degenerative disc disease; Chronic, continuous use of opioids; Fibromyalgia; Lumbar spondylosis; Chronic pain syndrome; and Encounter for long-term opiate analgesic use on their pertinent problem list.  Pain Assessment: Severity of Chronic pain is reported as a 7 /10. Location: Back Lower, right foot. Onset: More than a month ago. Quality: Sharp,Shooting. Timing: Constant. Modifying factor(s): meds. Vitals:  height is $RemoveB'5\' 8"'dtBefeOe$  (1.727 m) and weight is 240 lb (108.9 kg). Her temporal temperature is 98.2 F (36.8 C). Her blood pressure is 146/91 (abnormal) and her pulse is 74. Her respiration is 16 and oxygen saturation is 99%.   Reason for encounter: medication management.    S/P right revision surgery of right foot arthrodesis.  States that the surgery was not all that helpful.  Continues to have persistent pain  is still walking with her ankle inverted. Today we had an extensive discussion regarding spinal cord stimulation, risk versus benefits Patient would like to proceed with spinal cord stimulator trial.  I will order thoracic, lumbar MRI as well as psychological evaluation for SCS trial/implant planning.   Pharmacotherapy Assessment   Analgesic: Percocet 10 mg 4 times daily as needed, quantity 120/month MME equals 60   Monitoring: [place] PMP: PDMP reviewed during this encounter.       Pharmacotherapy: No side-effects or adverse reactions reported. Compliance: No problems identified. Effectiveness: Clinically acceptable.  Dewayne Shorter, RN  09/07/2020  2:18 PM  Sign when Signing Visit Nursing Pain Medication Assessment:  Safety precautions to be maintained throughout the outpatient stay will include: orient to surroundings, keep bed in low position, maintain call bell within reach at all times, provide assistance with transfer out of bed and ambulation.  Medication Inspection Compliance: Pill count conducted under aseptic conditions, in front of the patient. Neither the pills nor the bottle was removed from the patient's sight at any time. Once count was completed pills were immediately returned to the patient in their original bottle.  Medication: Oxycodone/APAP Pill/Patch Count:  120 of 120 pills remain Pill/Patch Appearance: Markings consistent with prescribed medication Bottle Appearance: Standard pharmacy container. Clearly labeled. Filled Date: 06 / 23 / 2022 Last Medication intake:  Today      UDS:  Summary  Date Value Ref Range Status  06/08/2020 Note  Final    Comment:    ==================================================================== [employer] [facility] (MW) ==================================================================== Test  Result       Flag       Units  Drug Present and Declared for Prescription Verification   Amphetamine                     >3953        EXPECTED   ng/mg creat    Amphetamine is available as a schedule II prescription drug.    Oxycodone                      >3953        EXPECTED   ng/mg creat   Oxymorphone                    1119         EXPECTED   ng/mg creat   Noroxycodone                   >3953        EXPECTED   ng/mg creat   Noroxymorphone                 310          EXPECTED   ng/mg creat    Sources of oxycodone are scheduled prescription medications.    Oxymorphone, noroxycodone, and noroxymorphone are expected    metabolites of oxycodone. Oxymorphone is also available as a    scheduled prescription medication.  ==================================================================== Test                      Result    Flag   Units      Ref Range   Creatinine              253              mg/dL      >=20 ==================================================================== Declared Medications:  The flagging and interpretation on this report are based on the  following declared medications.  Unexpected results may arise from  inaccuracies in the declared medications.   **Note: The testing scope of this panel includes these medications:   Amphetamine (Adderall)  Oxycodone (Percocet)   **Note: The testing scope of this panel does not include the  following reported medications:   Acetaminophen (Percocet)  Amlodipine (Norvasc)  Diclofenac  Duloxetine (Cymbalta)  Lidocaine (Xylocaine)  Methocarbamol (Robaxin)  Nystatin (Mycostatin)  Omeprazole (Prilosec)  Oxybutynin  Trazodone (Desyrel) ==================================================================== For clinical consultation, please call [phone]. ====================================================================      ROS  Constitutional: Denies any fever or chills Gastrointestinal: No reported hemesis, hematochezia, vomiting, or acute GI distress Musculoskeletal:  Low back, right ankle and foot pain Neurological: No  reported episodes of acute onset apraxia, aphasia, dysarthria, agnosia, amnesia, paralysis, loss of coordination, or loss of consciousness  Medication Review  DULoxetine, Diclofenac Sodium CR, Oxybutynin Chloride, amLODipine, amphetamine-dextroamphetamine, lidocaine, methocarbamol, nystatin, omeprazole, oxyCODONE-acetaminophen, and traZODone  History Review  Allergy: [name] is allergic to wellbutrin [bupropion] and lyrica [pregabalin]. Drug: [name]  reports no history of drug use. Alcohol:  reports current alcohol use. Tobacco:  reports that she quit smoking about 20 years ago. Her smoking use included cigarettes. She has never used smokeless tobacco. Social: [name]  reports that she quit smoking about 20 years ago. Her smoking use included cigarettes. She has never used smokeless tobacco. She reports current alcohol use. She reports that she does not use drugs. Medical:  has a past medical history  of ADHD (attention deficit hyperactivity disorder), Arthritis, Back pain, Broken femur ([place]) (08/27/12), Chronic pain, GERD (gastroesophageal reflux disease), Migraine, Osteoarthritis, Raynaud disease, and Raynaud phenomenon (04/06/2018). Surgical: [name]  has a past surgical history that includes Total knee arthroplasty; Heel spur surgery; Back surgery; IM nailing femoral shaft fracture (Left, 08/27/2012); Femur IM nail (Left, 08/27/2012); Cervical biopsy w/ loop electrode excision (1990's); Endometrial biopsy (04-05-05 and 02-12-06); Tubal ligation; Shoulder surgery (Left); Vaginal hysterectomy; Foot arthrodesis (Right, 02/01/2020); and Achilles tendon lengthening (Right, 02/01/2020). Family: family history includes Bipolar disorder in her mother and sister; Breast cancer in her mother; Cancer in her father, mother, and another family member; Heart failure in her father and another family member.  Laboratory Chemistry Profile   Renal Lab Results  Component Value Date   BUN 13  08/30/2019   CREATININE 0.66 08/30/2019   BCR 20 08/30/2019   GFRAA 115 08/30/2019   GFRNONAA 100 08/30/2019     Hepatic Lab Results  Component Value Date   AST 26 03/15/2017   ALT 26 03/15/2017   ALBUMIN 4.3 03/15/2017   ALKPHOS 117 03/15/2017   LIPASE 25 01/12/2016     Electrolytes Lab Results  Component Value Date   NA 141 08/30/2019   K 5.2 08/30/2019   CL 105 08/30/2019   CALCIUM 9.2 08/30/2019     Bone Lab Results  Component Value Date   VD25OH 31 01/07/2013     Inflammation (CRP: Acute Phase) (ESR: Chronic Phase) Lab Results  Component Value Date   CRP 1.7 08/03/2014   ESRSEDRATE 4 04/06/2018       Note: Above Lab results reviewed.  Recent Imaging Review  DG C-Arm 1-60 Min CLINICAL DATA:  ORIF right foot  EXAM: RIGHT FOOT COMPLETE - 3+ VIEW; DG C-ARM 1-60 MIN  COMPARISON:  CT right foot 11/09/2019  FINDINGS: Subtalar arthrodesis. Arthrodesis of the talonavicular joint. Arthrodesis of the calcaneocuboid joint with plate and screws.  IMPRESSION: Hindfoot arthrodesis.  Electronically Signed   By: Franchot Gallo M.D.   On: 02/01/2020 16:18 DG Foot Complete Right CLINICAL DATA:  ORIF right foot  EXAM: RIGHT FOOT COMPLETE - 3+ VIEW; DG C-ARM 1-60 MIN  COMPARISON:  CT right foot 11/09/2019  FINDINGS: Subtalar arthrodesis. Arthrodesis of the talonavicular joint. Arthrodesis of the calcaneocuboid joint with plate and screws.  IMPRESSION: Hindfoot arthrodesis.  Electronically Signed   By: Franchot Gallo M.D.   On: 02/01/2020 16:18 Note: Reviewed        Physical Exam  General appearance: Well nourished, well developed, and well hydrated. In no apparent acute distress Mental status: Alert, oriented x 3 (person, place, & time)       Respiratory: No evidence of acute respiratory distress Eyes: PERLA Vitals: BP 137/69   Pulse 78   Temp (!) 96.9 F (36.1 C)   Resp 18   Ht $R'5\' 8"'CN$  (1.727 m)   Wt 241 lb (109.3 kg)   LMP 03/18/2004   SpO2  99%   BMI 36.64 kg/m  BMI: Estimated body mass index is 36.64 kg/m as calculated from the following:   Height as of this encounter: $RemoveBeforeD'5\' 8"'ClqPGPjLgSmzma$  (1.727 m).   Weight as of this encounter: 241 lb (109.3 kg). Ideal: Ideal body weight: 63.9 kg (140 lb 14 oz) Adjusted ideal body weight: 82.1 kg (180 lb 14.8 oz)   Lumbar Spine Area Exam  Skin & Axial Inspection: No masses, redness, or swelling Alignment: Symmetrical Functional ROM: Pain restricted ROM affecting both sides Stability: No  instability detected Muscle Tone/Strength: Functionally intact. No obvious neuro-muscular anomalies detected. Sensory (Neurological): Musculoskeletal pain pattern   Lower Extremity Exam      Side: Right lower extremity   Side: Left lower extremity  Stability:  Right ankle eversion          Stability: No instability observed          Skin & Extremity Inspection: Skin color, temperature, and hair growth are WNL. No peripheral edema or cyanosis. No masses, redness, swelling, asymmetry, or associated skin lesions. No contractures.   Skin & Extremity Inspection: Skin color, temperature, and hair growth are WNL. No peripheral edema or cyanosis. No masses, redness, swelling, asymmetry, or associated skin lesions. No contractures.  Functional ROM: Pain restricted ROM for hip and knee joints, right ankle pain with right ankle eversion.           Functional ROM: Pain restricted ROM for hip and knee joints          Muscle Tone/Strength: Functionally intact. No obvious neuro-muscular anomalies detected.   Muscle Tone/Strength: Functionally intact. No obvious neuro-muscular anomalies detected.  Sensory (Neurological): Arthropathic arthralgia         Sensory (Neurological): Arthropathic arthralgia        DTR: Patellar: deferred today Achilles: deferred today Plantar: deferred today   DTR: Patellar: deferred today Achilles: deferred today Plantar: deferred today  Palpation: No palpable anomalies   Palpation: No palpable  anomalies      Assessment   Status Diagnosis  Persistent Persistent Persistent 1. Chronic pain syndrome   2. Chronic radicular lumbar pain   3. Lumbar radiculopathy   4. Spinal stenosis, lumbar region, with neurogenic claudication   5. Encounter for long-term opiate analgesic use   6. Failed back surgical syndrome   7. S/P lumbar microdiscectomy (left L4/5 with micordissection)   8. Other intervertebral disc degeneration, lumbar region       Plan of Care   Ms. MONEA Lauren Greene has a current medication list which includes the following long-term medication(s): amlodipine, amphetamine-dextroamphetamine, duloxetine, omeprazole, trazodone, amphetamine-dextroamphetamine, amphetamine-dextroamphetamine, [START ON 10/07/2020] oxycodone-acetaminophen, [START ON 11/06/2020] oxycodone-acetaminophen, and [START ON 12/06/2020] oxycodone-acetaminophen.   I discussed  percutaneous spinal cord stimulator trial with the patient in detail. I explained to the patient that they will have an external power source and programmer which the patient will use for 7 days. There will be daily communication with the stimulator company and the patient. A possible need for a mid-trial clinic visit to give the patient the best chance of success.   Patient will need to have a thorough psychosocial behavioral evaluation. Our office will be happy to help facilitate this. Will place referral to Dr Lyman Speller.  I will also obtain thoracic and lumbar MRI as well for spinal cord stimulator trial planning  Some of patient's pain does seem to be mechanical in nature, with some component of neurogenic pain as well. We discussed the indications for spinal cord stimulation, specifically stating that it is typically better for neuropathic and appendicular pain, but that we have had some success in the treatment of low back and hip pain.   Patient is interested in proceeding with spinal cord stimulation trial. She understands that  this may not be successful, and that spinal cord stimulation in general is not a "magic bullet."   We had a lengthy and very detailed discussion of all the risks, benefits, alternatives, and rationale of surgery as well as the option of continuing nonsurgical therapies. We specifically discussed  the risks of temporary or permanent worsened neurologic injury, no symptomatic relief or pain made worse after procedure, and also the need for future surgery (due to infection, CSF leak, bleeding, adjacent segment issues, bone-healing difficulties, and other related issues). No guarantees of outcome were made or implied and he is eager to proceed and presents for definitive treatment.  [name] will follow up with me after she has completed her psychological clearance and her thoracic and lumbar MRI to further discuss and hopefully schedule SCS trial.  Pharmacotherapy (Medications Ordered): Meds ordered this encounter  Medications   oxyCODONE-acetaminophen (PERCOCET) 10-325 MG tablet    Sig: Take 1 tablet by mouth every 6 (six) hours as needed for pain. Must last 30 days.    Dispense:  120 tablet    Refill:  0    Chronic Pain. (STOP Act - Not applicable). Fill one day early if closed on scheduled refill date.   oxyCODONE-acetaminophen (PERCOCET) 10-325 MG tablet    Sig: Take 1 tablet by mouth every 6 (six) hours as needed for pain. Must last 30 days.    Dispense:  120 tablet    Refill:  0    Chronic Pain. (STOP Act - Not applicable). Fill one day early if closed on scheduled refill date.   oxyCODONE-acetaminophen (PERCOCET) 10-325 MG tablet    Sig: Take 1 tablet by mouth every 6 (six) hours as needed for pain. Must last 30 days.    Dispense:  120 tablet    Refill:  0    Chronic Pain. (STOP Act - Not applicable). Fill one day early if closed on scheduled refill date.    Orders:  Orders Placed This Encounter  Procedures   MR THORACIC SPINE WO CONTRAST    Standing Status:   Future    Standing  Expiration Date:   09/07/2021    Order Specific Question:   What is the patient's sedation requirement?    Answer:   No Sedation    Order Specific Question:   Does the patient have a pacemaker or implanted devices?    Answer:   No    Order Specific Question:   Preferred imaging location?    Answer:   [facility] (table limit - 550lbs)   MR LUMBAR SPINE WO CONTRAST    Patient presents with axial pain with possible radicular component.  In addition to any acute findings, please report on:  1. Facet (Zygapophyseal) joint DJD (Hypertrophy, space narrowing, subchondral sclerosis, and/or osteophyte formation) 2. DDD and/or IVDD (Loss of disc height, desiccation or "Black disc disease") 3. Pars defects 4. Spondylolisthesis, spondylosis, and/or spondyloarthropathies (include Degree/Grade of displacement in mm) 5. Vertebral body Fractures, including age (old, new/acute) 49. Modic Type Changes 7. Demineralization 8. Bone pathology 9. Central, Lateral Recess, and/or Foraminal Stenosis (include AP diameter of stenosis in mm) 10. Surgical changes (hardware type, status, and presence of fibrosis)  NOTE: Please specify level(s) and laterality.    Standing Status:   Future    Standing Expiration Date:   10/07/2020    Scheduling Instructions:     Imaging must be done as soon as possible. Inform patient that order will expire within 30 days and I will not renew it.    Order Specific Question:   What is the patient's sedation requirement?    Answer:   No Sedation    Order Specific Question:   Does the patient have a pacemaker or implanted devices?    Answer:   No  Order Specific Question:   Preferred imaging location?    Answer:   [facility] (table limit [phone])    Order Specific Question:   Call Results- Best Contact Number?    Answer:   [phone] ([facility])    Order Specific Question:   Radiology Contrast Protocol - do NOT remove file path    Answer:    [email or web address]   Ambulatory referral to Psychology    Referral Priority:   Routine    Referral Type:   Psychiatric    Referral Reason:   Specialty Services Required    Referred to Provider:   Renaee Munda, PhD    Requested Specialty:   Psychology    Number of Visits Requested:   1    Follow-up plan:   Return in about 3 months (around 12/08/2020) for Medication Management, in person.   Recent Visits No visits were found meeting these conditions. Showing recent visits within past 90 days and meeting all other requirements Today's Visits Date Type Provider Dept  09/07/20 Office Visit Gillis Santa, MD [facility]  Showing today's visits and meeting all other requirements Future Appointments No visits were found meeting these conditions. Showing future appointments within next 90 days and meeting all other requirements I discussed the assessment and treatment plan with the patient. The patient was provided an opportunity to ask questions and all were answered. The patient agreed with the plan and demonstrated an understanding of the instructions.  Patient advised to call back or seek an in-person evaluation if the symptoms or condition worsens.  Duration of encounter: 30 minutes.  Note by: Gillis Santa, MD Date: 09/07/2020; Time: 2:54 PM

## 2020-09-13 ENCOUNTER — Other Ambulatory Visit: Payer: Self-pay

## 2020-09-13 ENCOUNTER — Ambulatory Visit (INDEPENDENT_AMBULATORY_CARE_PROVIDER_SITE_OTHER): Payer: PPO | Admitting: Family Medicine

## 2020-09-13 VITALS — BP 136/84 | HR 75 | Temp 97.9°F | Ht 68.0 in | Wt 241.0 lb

## 2020-09-13 DIAGNOSIS — E785 Hyperlipidemia, unspecified: Secondary | ICD-10-CM | POA: Diagnosis not present

## 2020-09-13 DIAGNOSIS — M1990 Unspecified osteoarthritis, unspecified site: Secondary | ICD-10-CM | POA: Diagnosis not present

## 2020-09-13 DIAGNOSIS — M791 Myalgia, unspecified site: Secondary | ICD-10-CM | POA: Diagnosis not present

## 2020-09-13 DIAGNOSIS — F988 Other specified behavioral and emotional disorders with onset usually occurring in childhood and adolescence: Secondary | ICD-10-CM | POA: Diagnosis not present

## 2020-09-13 MED ORDER — AMPHETAMINE-DEXTROAMPHET ER 30 MG PO CP24: ORAL_CAPSULE | ORAL | 0 refills | Status: DC

## 2020-09-13 NOTE — Progress Notes (Signed)
   Subjective:    Patient ID: Lauren Greene, female    DOB: [birth year], 55 y.o.   MRN: [id number]  HPI   This patient has adult ADD. Takes medication responsibly. Medication does help the patient focus in be more functional. Patient relates that they are or not abusing the medication or misusing the medication. The patient understands that if they're having any negative side effects such as elevated high blood pressure severe headaches they would need stop the medication follow-up immediately. They also understand that the prescriptions are to last for 3 months then the patient will need to follow-up before having further prescriptions.  Patient compliance daily   Does medication help patient function /attention better  yes  Side effects none Review of Systems     Objective:   Physical Exam General-in no acute distress Eyes-no discharge Lungs-respiratory rate normal, CTA CV-no murmurs,RRR Extremities skin warm dry no edema Neuro grossly normal Behavior normal, alert        Assessment & Plan:  ADD Under good control with current medication Continue current medication Watch diet closely Drug registry checked Patient states she does benefit from the medication Follow-up again in 3 months

## 2020-09-17 LAB — TISSUE TRANSGLUTAMINASE, IGA: Transglutaminase IgA: <2 U/mL (ref 0–3)

## 2020-09-17 LAB — COMPREHENSIVE METABOLIC PANEL
ALT: 21 IU/L (ref 0–32)
AST: 18 IU/L (ref 0–40)
Albumin/Globulin Ratio: 1.6 (ref 1.2–2.2)
Albumin: 4.4 g/dL (ref 3.8–4.9)
Alkaline Phosphatase: 123 IU/L — ABNORMAL HIGH (ref 44–121)
BUN/Creatinine Ratio: 19 (ref 9–23)
BUN: 21 mg/dL (ref 6–24)
Bilirubin Total: 0.3 mg/dL (ref 0.0–1.2)
CO2: 24 mmol/L (ref 20–29)
Calcium: 9.6 mg/dL (ref 8.7–10.2)
Chloride: 104 mmol/L (ref 96–106)
Creatinine, Ser: 1.13 mg/dL — ABNORMAL HIGH (ref 0.57–1.00)
Globulin, Total: 2.7 g/dL (ref 1.5–4.5)
Glucose: 91 mg/dL (ref 65–99)
Potassium: 5.3 mmol/L — ABNORMAL HIGH (ref 3.5–5.2)
Sodium: 142 mmol/L (ref 134–144)
Total Protein: 7.1 g/dL (ref 6.0–8.5)
eGFR: 57 mL/min/{1.73_m2} — ABNORMAL LOW (ref >59)

## 2020-09-17 LAB — LIPID PANEL
Chol/HDL Ratio: 3.4 ratio (ref 0.0–4.4)
Cholesterol, Total: 224 mg/dL — ABNORMAL HIGH (ref 100–199)
HDL: 65 mg/dL (ref >39)
LDL Chol Calc (NIH): 140 mg/dL — ABNORMAL HIGH (ref 0–99)
Triglycerides: 106 mg/dL (ref 0–149)
VLDL Cholesterol Cal: 19 mg/dL (ref 5–40)

## 2020-09-17 LAB — HEPATIC FUNCTION PANEL: Bilirubin, Direct: <0.1 mg/dL (ref 0.00–0.40)

## 2020-09-17 LAB — C-REACTIVE PROTEIN: CRP: 2 mg/L (ref 0–10)

## 2020-09-17 LAB — ANA: Anti Nuclear Antibody (ANA): NEGATIVE

## 2020-09-17 LAB — CK: Total CK: 163 U/L (ref 32–182)

## 2020-09-20 ENCOUNTER — Other Ambulatory Visit: Payer: Self-pay | Admitting: *Deleted

## 2020-09-20 DIAGNOSIS — M79671 Pain in right foot: Secondary | ICD-10-CM | POA: Diagnosis not present

## 2020-09-20 DIAGNOSIS — R748 Abnormal levels of other serum enzymes: Secondary | ICD-10-CM

## 2020-09-20 DIAGNOSIS — Z981 Arthrodesis status: Secondary | ICD-10-CM | POA: Diagnosis not present

## 2020-09-26 ENCOUNTER — Ambulatory Visit (HOSPITAL_COMMUNITY)
Admission: RE | Admit: 2020-09-26 | Discharge: 2020-09-26 | Disposition: A | Discharge status: Home / Self Care | Payer: PPO | Source: Ambulatory Visit | Attending: Student in an Organized Health Care Education/Training Program | Admitting: Student in an Organized Health Care Education/Training Program

## 2020-09-26 ENCOUNTER — Other Ambulatory Visit: Payer: Self-pay

## 2020-09-26 DIAGNOSIS — M5136 Other intervertebral disc degeneration, lumbar region: Secondary | ICD-10-CM | POA: Insufficient documentation

## 2020-09-26 DIAGNOSIS — M48062 Spinal stenosis, lumbar region with neurogenic claudication: Secondary | ICD-10-CM

## 2020-09-26 DIAGNOSIS — M961 Postlaminectomy syndrome, not elsewhere classified: Secondary | ICD-10-CM

## 2020-09-26 DIAGNOSIS — G894 Chronic pain syndrome: Secondary | ICD-10-CM | POA: Insufficient documentation

## 2020-09-26 DIAGNOSIS — Z9889 Other specified postprocedural states: Secondary | ICD-10-CM

## 2020-09-26 DIAGNOSIS — M545 Low back pain, unspecified: Secondary | ICD-10-CM | POA: Diagnosis not present

## 2020-09-26 DIAGNOSIS — M4804 Spinal stenosis, thoracic region: Secondary | ICD-10-CM | POA: Diagnosis not present

## 2020-09-26 DIAGNOSIS — M47814 Spondylosis without myelopathy or radiculopathy, thoracic region: Secondary | ICD-10-CM | POA: Diagnosis not present

## 2020-09-30 ENCOUNTER — Other Ambulatory Visit: Payer: Self-pay | Admitting: Family Medicine

## 2020-10-02 ENCOUNTER — Other Ambulatory Visit: Payer: Self-pay | Admitting: Family Medicine

## 2020-10-02 NOTE — Telephone Encounter (Signed)
Pt needs medication refill for   Diclofenac Sodium CR 100 MG 24 hr tablet  [facility] - [place], [place] - [address]

## 2020-10-03 MED ORDER — DICLOFENAC SODIUM ER 100 MG PO TB24: ORAL_TABLET | ORAL | 0 refills | Status: DC

## 2020-10-04 ENCOUNTER — Encounter: Payer: Self-pay | Admitting: Family Medicine

## 2020-10-04 DIAGNOSIS — R748 Abnormal levels of other serum enzymes: Secondary | ICD-10-CM

## 2020-10-04 DIAGNOSIS — I10 Essential (primary) hypertension: Secondary | ICD-10-CM

## 2020-10-04 DIAGNOSIS — Z79899 Other long term (current) drug therapy: Secondary | ICD-10-CM

## 2020-10-04 NOTE — Telephone Encounter (Signed)
Since-we are following her creatinine.  She needs to do a repeat metabolic 7 within the next 20 days.  May have 2 additional refills but must get the follow-up met 7 done within the next 2 to 3 weeks 

## 2020-10-05 MED ORDER — DICLOFENAC SODIUM ER 100 MG PO TB24: ORAL_TABLET | ORAL | 2 refills | Status: DC

## 2020-10-05 NOTE — Addendum Note (Signed)
Addended by: Marlowe Shores on: 10/05/2020 08:15 AM   Modules accepted: Orders

## 2020-10-05 NOTE — Addendum Note (Signed)
Addended by: Marlowe Shores on: 10/05/2020 08:13 AM   Modules accepted: Orders

## 2020-10-09 DIAGNOSIS — Z6837 Body mass index (BMI) 37.0-37.9, adult: Secondary | ICD-10-CM | POA: Diagnosis not present

## 2020-10-09 DIAGNOSIS — Z1589 Genetic susceptibility to other disease: Secondary | ICD-10-CM | POA: Diagnosis not present

## 2020-10-09 DIAGNOSIS — M064 Inflammatory polyarthropathy: Secondary | ICD-10-CM | POA: Diagnosis not present

## 2020-10-09 DIAGNOSIS — E669 Obesity, unspecified: Secondary | ICD-10-CM | POA: Diagnosis not present

## 2020-10-09 DIAGNOSIS — M459 Ankylosing spondylitis of unspecified sites in spine: Secondary | ICD-10-CM | POA: Diagnosis not present

## 2020-10-09 DIAGNOSIS — M150 Primary generalized (osteo)arthritis: Secondary | ICD-10-CM | POA: Diagnosis not present

## 2020-10-09 DIAGNOSIS — R5383 Other fatigue: Secondary | ICD-10-CM | POA: Diagnosis not present

## 2020-10-09 DIAGNOSIS — G894 Chronic pain syndrome: Secondary | ICD-10-CM | POA: Diagnosis not present

## 2020-10-09 DIAGNOSIS — I7300 Raynaud's syndrome without gangrene: Secondary | ICD-10-CM | POA: Diagnosis not present

## 2020-10-18 ENCOUNTER — Other Ambulatory Visit: Payer: Self-pay | Admitting: Family Medicine

## 2020-11-08 DIAGNOSIS — M545 Low back pain, unspecified: Secondary | ICD-10-CM | POA: Diagnosis not present

## 2020-11-08 DIAGNOSIS — G894 Chronic pain syndrome: Secondary | ICD-10-CM | POA: Diagnosis not present

## 2020-11-09 DIAGNOSIS — Z79899 Other long term (current) drug therapy: Secondary | ICD-10-CM | POA: Diagnosis not present

## 2020-11-09 DIAGNOSIS — I10 Essential (primary) hypertension: Secondary | ICD-10-CM | POA: Diagnosis not present

## 2020-11-09 DIAGNOSIS — R748 Abnormal levels of other serum enzymes: Secondary | ICD-10-CM | POA: Diagnosis not present

## 2020-11-10 LAB — BASIC METABOLIC PANEL
BUN/Creatinine Ratio: 23 (ref 9–23)
BUN: 18 mg/dL (ref 6–24)
CO2: 23 mmol/L (ref 20–29)
Calcium: 8.9 mg/dL (ref 8.7–10.2)
Chloride: 108 mmol/L — ABNORMAL HIGH (ref 96–106)
Creatinine, Ser: 0.79 mg/dL (ref 0.57–1.00)
Glucose: 119 mg/dL — ABNORMAL HIGH (ref 65–99)
Potassium: 4.9 mmol/L (ref 3.5–5.2)
Sodium: 147 mmol/L — ABNORMAL HIGH (ref 134–144)
eGFR: 88 mL/min/{1.73_m2} (ref >59)

## 2020-11-21 ENCOUNTER — Other Ambulatory Visit: Payer: Self-pay | Admitting: Family Medicine

## 2020-12-04 ENCOUNTER — Encounter: Payer: PPO | Admitting: Student in an Organized Health Care Education/Training Program

## 2020-12-05 ENCOUNTER — Encounter: Payer: PPO | Admitting: Student in an Organized Health Care Education/Training Program

## 2020-12-07 ENCOUNTER — Encounter: Payer: PPO | Admitting: Student in an Organized Health Care Education/Training Program

## 2020-12-14 ENCOUNTER — Other Ambulatory Visit: Payer: Self-pay

## 2020-12-14 ENCOUNTER — Ambulatory Visit (INDEPENDENT_AMBULATORY_CARE_PROVIDER_SITE_OTHER): Payer: PPO | Admitting: Family Medicine

## 2020-12-14 DIAGNOSIS — F988 Other specified behavioral and emotional disorders with onset usually occurring in childhood and adolescence: Secondary | ICD-10-CM

## 2020-12-14 MED ORDER — AMPHETAMINE-DEXTROAMPHET ER 30 MG PO CP24: ORAL_CAPSULE | ORAL | 0 refills | Status: DC

## 2020-12-14 MED ORDER — TRAZODONE HCL 100 MG PO TABS: ORAL_TABLET | ORAL | 3 refills | Status: DC

## 2020-12-14 MED ORDER — OMEPRAZOLE 40 MG PO CPDR: DELAYED_RELEASE_CAPSULE | ORAL | 3 refills | Status: DC

## 2020-12-14 MED ORDER — DICLOFENAC SODIUM ER 100 MG PO TB24: ORAL_TABLET | ORAL | 12 refills | Status: DC

## 2020-12-14 MED ORDER — AMLODIPINE BESYLATE 2.5 MG PO TABS: 2.5000 mg | ORAL_TABLET | Freq: Every day | ORAL | 1 refills | Status: DC

## 2020-12-14 NOTE — Progress Notes (Signed)
   Subjective:    Patient ID: Lauren Greene, female    DOB: 12-22-1965, 55 y.o.   MRN: [id number]  HPI  Patient arrives for a follow up on ADHD. Patient doing well on current meds. Patient states she needs to make sure she gets refills on all her meds. Patient states her trazodone 100 mg one at bedtime needs to be corrected she takes 2 a night and needs the script updated to reflect that. Bvadd This patient has adult ADD. Takes medication responsibly. Medication does help the patient focus in be more functional. Patient relates that they are or not abusing the medication or misusing the medication. The patient understands that if they're having any negative side effects such as elevated high blood pressure severe headaches they would need stop the medication follow-up immediately. They also understand that the prescriptions are to last for 3 months then the patient will need to follow-up before having further prescriptions.  Patient compliance relates compliance  Does medication help patient function /attention better does help her function without it she has a hard time focusing  Side effects denies side effects  Does take trazodone at night to help her sleep we did warn her about the increased risk of serotonin syndrome patient voices awareness  . Review of Systems     Objective:   Physical Exam  Lungs are clear heart regular HEENT benign extremities mild ankle edema      Assessment & Plan:   The patient was seen today as part of the visit regarding ADD.  Patient is stable on current regimen.  Appropriate prescriptions prescribed.  Medications were reviewed with the patient as well as compliance. Side effects were checked for. Discussion regarding effectiveness was held. Prescriptions were electronically sent in.  Patient reminded to follow-up in approximately 3 months.   Plans to [facility] law with drug registry was checked and verified while present with the  patient. Follow-up in 3 months

## 2020-12-26 ENCOUNTER — Ambulatory Visit
Payer: PPO | Attending: Student in an Organized Health Care Education/Training Program | Admitting: Student in an Organized Health Care Education/Training Program

## 2020-12-26 ENCOUNTER — Encounter: Payer: Self-pay | Admitting: Student in an Organized Health Care Education/Training Program

## 2020-12-26 ENCOUNTER — Other Ambulatory Visit: Payer: Self-pay

## 2020-12-26 VITALS — BP 175/93 | HR 72 | Temp 97.0°F | Resp 15 | Ht 68.0 in | Wt 235.0 lb

## 2020-12-26 DIAGNOSIS — G894 Chronic pain syndrome: Secondary | ICD-10-CM | POA: Diagnosis not present

## 2020-12-26 DIAGNOSIS — Z79891 Long term (current) use of opiate analgesic: Secondary | ICD-10-CM | POA: Diagnosis not present

## 2020-12-26 DIAGNOSIS — M961 Postlaminectomy syndrome, not elsewhere classified: Secondary | ICD-10-CM | POA: Diagnosis not present

## 2020-12-26 DIAGNOSIS — Z9889 Other specified postprocedural states: Secondary | ICD-10-CM | POA: Insufficient documentation

## 2020-12-26 DIAGNOSIS — M5416 Radiculopathy, lumbar region: Secondary | ICD-10-CM | POA: Diagnosis not present

## 2020-12-26 DIAGNOSIS — M48062 Spinal stenosis, lumbar region with neurogenic claudication: Secondary | ICD-10-CM | POA: Insufficient documentation

## 2020-12-26 DIAGNOSIS — G8929 Other chronic pain: Secondary | ICD-10-CM | POA: Diagnosis not present

## 2020-12-26 MED ORDER — OXYCODONE-ACETAMINOPHEN 10-325 MG PO TABS: 1.0000 | ORAL_TABLET | Freq: Four times a day (QID) | ORAL | 0 refills | Status: DC | PRN

## 2020-12-26 NOTE — Progress Notes (Signed)
Nursing Pain Medication Assessment:  Safety precautions to be maintained throughout the outpatient stay will include: orient to surroundings, keep bed in low position, maintain call bell within reach at all times, provide assistance with transfer out of bed and ambulation.  Medication Inspection Compliance: Pill count conducted under aseptic conditions, in front of the patient. Neither the pills nor the bottle was removed from the patient's sight at any time. Once count was completed pills were immediately returned to the patient in their original bottle.  Medication: Oxycodone/APAP Pill/Patch Count:  118 of 120 pills remain Pill/Patch Appearance: Markings consistent with prescribed medication Bottle Appearance: Standard pharmacy container. Clearly labeled. Filled Date: 59 / 10 / 2022 Last Medication intake:  Today

## 2020-12-26 NOTE — Progress Notes (Signed)
PROVIDER NOTE: Information contained herein reflects review and annotations entered in association with encounter. Interpretation of such information and data should be left to medically-trained personnel. Information provided to patient can be located elsewhere in the medical record under "Patient Instructions". Document created using STT-dictation technology, any transcriptional errors that may result from process are unintentional.    Patient: Lauren Greene  Service Category: E/M  Provider: Gillis Santa, MD  DOB: 10-25-1965  DOS: 12/26/2020  Specialty: Interventional Pain Management  MRN: [id number]  Setting: Ambulatory outpatient  PCP: Kathyrn Drown, MD  Type: Established Patient    Referring Provider: Kathyrn Drown, MD  Location: Office  Delivery: Face-to-face     HPI  [name], a 55 y.o. year old female, is here today because of her Failed back surgical syndrome [M96.1]. [name] primary complain today is Back Pain (lower)  Pertinent problems: [name] has Periprosthetic fracture around internal prosthetic left knee joint; Chronic back pain; Morbid obesity ([place]); Lumbar degenerative disc disease; Chronic, continuous use of opioids; Fibromyalgia; Lumbar spondylosis; Chronic pain syndrome; and Encounter for long-term opiate analgesic use on their pertinent problem list.  Pain Assessment: Severity of Chronic pain is reported as a 7 /10. Location: Back Lower, right foot. Onset: More than a month ago. Quality: Sharp,Shooting. Timing: Constant. Modifying factor(s): meds. Vitals:  height is 5' 8" (1.727 m) and weight is 240 lb (108.9 kg). Her temporal temperature is 98.2 F (36.8 C). Her blood pressure is 146/91 (abnormal) and her pulse is 74. Her respiration is 16 and oxygen saturation is 99%.   Reason for encounter: medication management.    S/P right revision surgery of right foot arthrodesis.  States that the surgery was not all that helpful.  Continues to have  persistent pain is still walking with her ankle inverted.  Patient also has a history of prior lumbar spine surgery with failed back surgical syndrome.  Has tried physical therapy, spinal injections in the past with no response.  At her last visit, we discussed spinal cord stimulation.  She had a thoracic and lumbar MRI done that was unremarkable.  She also had her psychological evaluation with Dr. Lyman Speller which was unremarkable.  Today we discussed proceeding with spinal cord stimulator trial with Medtronic.  Risks and benefits were discussed at length.    We will also refill her oxycodone as below.  No change in dose.    Pharmacotherapy Assessment  Analgesic: Percocet 10 mg 4 times daily as needed, quantity 120/month MME equals 60   Monitoring: [place] PMP: PDMP reviewed during this encounter.       Pharmacotherapy: No side-effects or adverse reactions reported. Compliance: No problems identified. Effectiveness: Clinically acceptable.  UDS:  Summary  Date Value Ref Range Status  06/08/2020 Note  Final    Comment:    ==================================================================== [employer] [facility] (MW) ==================================================================== Test                             Result       Flag       Units  Drug Present and Declared for Prescription Verification   Amphetamine                    >3953        EXPECTED   ng/mg creat    Amphetamine is available as a schedule II prescription drug.    Oxycodone                      >  3953        EXPECTED   ng/mg creat   Oxymorphone                    1119         EXPECTED   ng/mg creat   Noroxycodone                   >3953        EXPECTED   ng/mg creat   Noroxymorphone                 310          EXPECTED   ng/mg creat    Sources of oxycodone are scheduled prescription medications.    Oxymorphone, noroxycodone, and noroxymorphone are expected    metabolites of oxycodone. Oxymorphone is also available  as a    scheduled prescription medication.  ==================================================================== Test                      Result    Flag   Units      Ref Range   Creatinine              253              mg/dL      >=20 ==================================================================== Declared Medications:  The flagging and interpretation on this report are based on the  following declared medications.  Unexpected results may arise from  inaccuracies in the declared medications.   **Note: The testing scope of this panel includes these medications:   Amphetamine (Adderall)  Oxycodone (Percocet)   **Note: The testing scope of this panel does not include the  following reported medications:   Acetaminophen (Percocet)  Amlodipine (Norvasc)  Diclofenac  Duloxetine (Cymbalta)  Lidocaine (Xylocaine)  Methocarbamol (Robaxin)  Nystatin (Mycostatin)  Omeprazole (Prilosec)  Oxybutynin  Trazodone (Desyrel) ==================================================================== For clinical consultation, please call [phone]. ====================================================================       ROS  Constitutional: Denies any fever or chills Gastrointestinal: No reported hemesis, hematochezia, vomiting, or acute GI distress Musculoskeletal:  Low back, right ankle and foot pain Neurological: No reported episodes of acute onset apraxia, aphasia, dysarthria, agnosia, amnesia, paralysis, loss of coordination, or loss of consciousness  Medication Review  Adalimumab, DULoxetine, Diclofenac Sodium CR, Oxybutynin Chloride, amLODipine, amphetamine-dextroamphetamine, lidocaine, methocarbamol, nystatin, omeprazole, oxyCODONE-acetaminophen, and traZODone  History Review  Allergy: Lauren Greene is allergic to wellbutrin [bupropion] and lyrica [pregabalin]. Drug: Lauren Greene  reports no history of drug use. Alcohol:  reports current alcohol use. Tobacco:  reports  that she quit smoking about 20 years ago. Her smoking use included cigarettes. She has never used smokeless tobacco. Social: Lauren Greene  reports that she quit smoking about 20 years ago. Her smoking use included cigarettes. She has never used smokeless tobacco. She reports current alcohol use. She reports that she does not use drugs. Medical:  has a past medical history of ADHD (attention deficit hyperactivity disorder), Arthritis, Back pain, Broken femur ([place]) (08/27/12), Chronic pain, GERD (gastroesophageal reflux disease), Migraine, Osteoarthritis, Raynaud disease, and Raynaud phenomenon (04/06/2018). Surgical: Lauren Greene  has a past surgical history that includes Total knee arthroplasty; Heel spur surgery; Back surgery; IM nailing femoral shaft fracture (Left, 08/27/2012); Femur IM nail (Left, 08/27/2012); Cervical biopsy w/ loop electrode excision (1990's); Endometrial biopsy (04-05-05 and 02-12-06); Tubal ligation; Shoulder surgery (Left); Vaginal hysterectomy; Foot arthrodesis (Right, 02/01/2020); and Achilles tendon lengthening (Right, 02/01/2020). Family: family history includes Bipolar disorder  in her mother and sister; Breast cancer in her mother; Cancer in her father, mother, and another family member; Heart failure in her father and another family member.  Laboratory Chemistry Profile   Renal Lab Results  Component Value Date   BUN 18 11/09/2020   CREATININE 0.79 11/09/2020   BCR 23 11/09/2020   GFRAA 115 08/30/2019   GFRNONAA 100 08/30/2019     Hepatic Lab Results  Component Value Date   AST 18 09/13/2020   ALT 21 09/13/2020   ALBUMIN 4.4 09/13/2020   ALKPHOS 123 (H) 09/13/2020   LIPASE 25 01/12/2016     Electrolytes Lab Results  Component Value Date   NA 147 (H) 11/09/2020   K 4.9 11/09/2020   CL 108 (H) 11/09/2020   CALCIUM 8.9 11/09/2020     Bone Lab Results  Component Value Date   VD25OH 31 01/07/2013     Inflammation (CRP: Acute Phase) (ESR: Chronic  Phase) Lab Results  Component Value Date   CRP 2 09/13/2020   ESRSEDRATE 4 04/06/2018       Note: Above Lab results reviewed.  Recent Imaging Review  MR LUMBAR SPINE WO CONTRAST CLINICAL DATA:  Chronic back pain. Remote history of fall. Neurogenic claudication and failed back surgery syndrome  EXAM: MRI THORACIC AND LUMBAR SPINE WITHOUT CONTRAST  TECHNIQUE: Multiplanar and multiecho pulse sequences of the thoracic and lumbar spine were obtained without intravenous contrast.  COMPARISON:  11/30/2015 lumbar spine MRI  FINDINGS: MRI THORACIC SPINE FINDINGS  Alignment:  Physiologic  Vertebrae: No fracture, evidence of discitis, or bone lesion.  Cord:  Normal signal and morphology.  Paraspinal and other soft tissues: Negative.  Disc levels:  T1-2: Small right subarticular disc protrusion mild right foraminal stenosis.  T6-7: Small left foraminal protrusion without stenosis.  T7-8: Small left subarticular disc protrusion.  No stenosis.  T8-9: Small central disc protrusion without stenosis.  T10-11: Small central disc protrusion without stenosis.  T11-12: Disc space narrowing with endplate spurring.  No stenosis.  The other thoracic levels are normal.  MRI LUMBAR SPINE FINDINGS  Segmentation:  Standard  Alignment: Levoscoliosis with apex at L2. Grade 1 retrolisthesis at L1-2.  Vertebrae:  No fracture, evidence of discitis, or bone lesion.  Conus medullaris and cauda equina: Conus extends to the L1-2 level. Conus and cauda equina appear normal.  Paraspinal and other soft tissues: Negative  Disc levels:  T12-L1: Normal.  L1-L2: Right asymmetric disc bulge. No spinal canal stenosis. Unchanged severe right neural foraminal stenosis.  L2-L3: Intermediate sized disc bulge, unchanged. Mild facet hypertrophy. Narrowing of both lateral recesses without central spinal canal stenosis. No neural foraminal stenosis.  L3-L4: Postsurgical changes. Recurrent  central disc protrusion. Unchanged mild spinal canal stenosis. Unchanged mild left neural foraminal stenosis.  L4-L5: Small disc bulge. No spinal canal stenosis. Unchanged mild left and no right neural foraminal stenosis.  L5-S1: Small disc bulge with endplate spurring, unchanged. Mild facet hypertrophy. No spinal canal stenosis. Unchanged mild bilateral neural foraminal stenosis.  Visualized sacrum: Normal.  IMPRESSION: 1. Recurrent central disc protrusion at L3-L4 with unchanged mild spinal canal stenosis. 2. Unchanged severe right L1-2 neural foraminal stenosis. 3. Unchanged mild bilateral L5-S1 neural foraminal stenosis. 4. Mild multilevel thoracic degenerative disc disease with mild right T1 foraminal stenosis.  Electronically Signed   By: Kevin  Herman M.D.   On: 09/28/2020 12:06 MR THORACIC SPINE WO CONTRAST CLINICAL DATA:  Chronic back pain. Remote history of fall. Neurogenic claudication and failed back surgery syndrome    EXAM: MRI THORACIC AND LUMBAR SPINE WITHOUT CONTRAST  TECHNIQUE: Multiplanar and multiecho pulse sequences of the thoracic and lumbar spine were obtained without intravenous contrast.  COMPARISON:  11/30/2015 lumbar spine MRI  FINDINGS: MRI THORACIC SPINE FINDINGS  Alignment:  Physiologic  Vertebrae: No fracture, evidence of discitis, or bone lesion.  Cord:  Normal signal and morphology.  Paraspinal and other soft tissues: Negative.  Disc levels:  T1-2: Small right subarticular disc protrusion mild right foraminal stenosis.  T6-7: Small left foraminal protrusion without stenosis.  T7-8: Small left subarticular disc protrusion.  No stenosis.  T8-9: Small central disc protrusion without stenosis.  T10-11: Small central disc protrusion without stenosis.  T11-12: Disc space narrowing with endplate spurring.  No stenosis.  The other thoracic levels are normal.  MRI LUMBAR SPINE FINDINGS  Segmentation:  Standard  Alignment:  Levoscoliosis with apex at L2. Grade 1 retrolisthesis at L1-2.  Vertebrae:  No fracture, evidence of discitis, or bone lesion.  Conus medullaris and cauda equina: Conus extends to the L1-2 level. Conus and cauda equina appear normal.  Paraspinal and other soft tissues: Negative  Disc levels:  T12-L1: Normal.  L1-L2: Right asymmetric disc bulge. No spinal canal stenosis. Unchanged severe right neural foraminal stenosis.  L2-L3: Intermediate sized disc bulge, unchanged. Mild facet hypertrophy. Narrowing of both lateral recesses without central spinal canal stenosis. No neural foraminal stenosis.  L3-L4: Postsurgical changes. Recurrent central disc protrusion. Unchanged mild spinal canal stenosis. Unchanged mild left neural foraminal stenosis.  L4-L5: Small disc bulge. No spinal canal stenosis. Unchanged mild left and no right neural foraminal stenosis.  L5-S1: Small disc bulge with endplate spurring, unchanged. Mild facet hypertrophy. No spinal canal stenosis. Unchanged mild bilateral neural foraminal stenosis.  Visualized sacrum: Normal.  IMPRESSION: 1. Recurrent central disc protrusion at L3-L4 with unchanged mild spinal canal stenosis. 2. Unchanged severe right L1-2 neural foraminal stenosis. 3. Unchanged mild bilateral L5-S1 neural foraminal stenosis. 4. Mild multilevel thoracic degenerative disc disease with mild right T1 foraminal stenosis.  Electronically Signed   By: Kevin  Herman M.D.   On: 09/28/2020 12:06 Note: Reviewed        Physical Exam  General appearance: Well nourished, well developed, and well hydrated. In no apparent acute distress Mental status: Alert, oriented x 3 (person, place, & time)       Respiratory: No evidence of acute respiratory distress Eyes: PERLA Vitals: BP (!) 175/93   Pulse 72   Temp (!) 97 F (36.1 C) (Temporal)   Resp 15   Ht 5' 8" (1.727 m)   Wt 235 lb (106.6 kg)   LMP 03/18/2004   SpO2 100%   BMI 35.73 kg/m  BMI:  Estimated body mass index is 35.73 kg/m as calculated from the following:   Height as of this encounter: 5' 8" (1.727 m).   Weight as of this encounter: 235 lb (106.6 kg). Ideal: Ideal body weight: 63.9 kg (140 lb 14 oz) Adjusted ideal body weight: 81 kg (178 lb 8.4 oz)   Lumbar Spine Area Exam  Skin & Axial Inspection: Surgical scar present from prior lumbar spine surgery Alignment: Symmetrical Functional ROM: Pain restricted ROM affecting both sides Stability: No instability detected Muscle Tone/Strength: Functionally intact. No obvious neuro-muscular anomalies detected. Sensory (Neurological): Musculoskeletal pain pattern   Lower Extremity Exam      Side: Right lower extremity   Side: Left lower extremity  Stability:  Right ankle eversion          Stability: No instability   observed          Skin & Extremity Inspection: Skin color, temperature, and hair growth are WNL. No peripheral edema or cyanosis. No masses, redness, swelling, asymmetry, or associated skin lesions. No contractures.   Skin & Extremity Inspection: Skin color, temperature, and hair growth are WNL. No peripheral edema or cyanosis. No masses, redness, swelling, asymmetry, or associated skin lesions. No contractures.  Functional ROM: Pain restricted ROM for hip and knee joints, right ankle pain with right ankle eversion.           Functional ROM: Pain restricted ROM for hip and knee joints          Muscle Tone/Strength: Functionally intact. No obvious neuro-muscular anomalies detected.   Muscle Tone/Strength: Functionally intact. No obvious neuro-muscular anomalies detected.  Sensory (Neurological): Neurogenic   Sensory (Neurological): Neurogenic pain pattern  DTR: Patellar: deferred today Achilles: deferred today Plantar: deferred today   DTR: Patellar: deferred today Achilles: deferred today Plantar: deferred today  Palpation: No palpable anomalies   Palpation: No palpable anomalies      Assessment   Status  Diagnosis  Persistent Persistent Persistent 1. Failed back surgical syndrome   2. S/P lumbar microdiscectomy (left L4/5 with micordissection)   3. Spinal stenosis, lumbar region, with neurogenic claudication   4. Chronic radicular lumbar pain   5. Lumbar radiculopathy   6. Encounter for long-term opiate analgesic use   7. Chronic pain syndrome        Plan of Care   [name] has a current medication list which includes the following long-term medication(s): adalimumab, amlodipine, amphetamine-dextroamphetamine, amphetamine-dextroamphetamine, amphetamine-dextroamphetamine, duloxetine, omeprazole, and trazodone.   I discussed  percutaneous spinal cord stimulator trial with the patient in detail. I explained to the patient that they will have an external power source and programmer which the patient will use for 7 days. There will be daily communication with the stimulator company and the patient. A possible need for a mid-trial clinic visit to give the patient the best chance of success.   Patient is status post thorough psychosocial behavioral evaluation  with Dr. Feldman  We have obtained thoracic and lumbar MRI as well for spinal cord stimulator trial planning and there is nothing to preclude safe percutaneous trial  Some of patient's pain does seem to be mechanical in nature, with some component of neurogenic pain as well. We discussed the indications for spinal cord stimulation, specifically stating that it is typically better for neuropathic and appendicular pain, but that we have had some success in the treatment of low back and hip pain.   Patient is interested in proceeding with spinal cord stimulation trial. She understands that this may not be successful, and that spinal cord stimulation in general is not a "magic bullet."   We had a lengthy and very detailed discussion of all the risks, benefits, alternatives, and rationale of surgery as well as the option of  continuing nonsurgical therapies. We specifically discussed the risks of temporary or permanent worsened neurologic injury, no symptomatic relief or pain made worse after procedure, and also the need for future surgery (due to infection, CSF leak, bleeding, adjacent segment issues, bone-healing difficulties, and other related issues). No guarantees of outcome were made or implied and he is eager to proceed and presents for definitive treatment.    Plan for Medtronic SCS trial  Refill of oxycodone as below.  No change in dose.  Pharmacotherapy (Medications Ordered): Meds ordered this encounter  Medications     oxyCODONE-acetaminophen (PERCOCET) 10-325 MG tablet    Sig: Take 1 tablet by mouth every 6 (six) hours as needed for pain. Must last 30 days.    Dispense:  120 tablet    Refill:  0    Chronic Pain. (STOP Act - Not applicable). Fill one day early if closed on scheduled refill date.   oxyCODONE-acetaminophen (PERCOCET) 10-325 MG tablet    Sig: Take 1 tablet by mouth every 6 (six) hours as needed for pain. Must last 30 days.    Dispense:  120 tablet    Refill:  0    Chronic Pain. (STOP Act - Not applicable). Fill one day early if closed on scheduled refill date.   oxyCODONE-acetaminophen (PERCOCET) 10-325 MG tablet    Sig: Take 1 tablet by mouth every 6 (six) hours as needed for pain. Must last 30 days.    Dispense:  120 tablet    Refill:  0    Chronic Pain. (STOP Act - Not applicable). Fill one day early if closed on scheduled refill date.     Orders:  Orders Placed This Encounter  Procedures   [place] representative to notify them of the scheduled case and to make sure they will be available to provide required equipment.    Standing Status:   Future    Standing Expiration Date:   06/26/2021    Scheduling Instructions:     Side: Bilateral     Level: Lumbar     Device: Medtronic     Sedation: With sedation     Timeframe: As soon as  pre-approved    Order Specific Question:   Where will this procedure be performed?    Answer:   [facility]     Follow-up plan:   Return in about 2 weeks (around 01/09/2021) for Medtronic SCS Trial , moderate sedation (ECT suite).   Recent Visits No visits were found meeting these conditions. Showing recent visits within past 90 days and meeting all other requirements Today's Visits Date Type Provider Dept  12/26/20 Office Visit Gillis Santa, MD [facility]  Showing today's visits and meeting all other requirements Future Appointments No visits were found meeting these conditions. Showing future appointments within next 90 days and meeting all other requirements I discussed the assessment and treatment plan with the patient. The patient was provided an opportunity to ask questions and all were answered. The patient agreed with the plan and demonstrated an understanding of the instructions.  Patient advised to call back or seek an in-person evaluation if the symptoms or condition worsens.  Duration of encounter: 30 minutes.  Note by: Gillis Santa, MD Date: 12/26/2020; Time: 3:49 PM

## 2020-12-26 NOTE — Patient Instructions (Addendum)
Moderate Conscious Sedation, Adult Sedation is the use of medicines to promote relaxation and to relieve discomfort and anxiety. Moderate conscious sedation is a type of sedation. Under moderate conscious sedation, you are less alert than normal, but you are still able to respond to instructions, touch, or both. Moderate conscious sedation is used during short medical and dental procedures. It is milder than deep sedation, which is a type of sedation under which you cannot be easily woken up. It is also milder than general anesthesia, which is the use of medicines to make you unconscious. Moderate conscious sedation allows you to return to your regular activities sooner. Tell a health care provider about: Any allergies you have. All medicines you are taking, including vitamins, herbs, eye drops, creams, and over-the-counter medicines. Any use of steroids. This includes steroids taken by mouth or as a cream. Any problems you or family members have had with sedatives and anesthetic medicines. Any blood disorders you have. Any surgeries you have had. Any medical conditions you have, such as sleep apnea. Whether you are pregnant or may be pregnant. Any use of cigarettes, alcohol, marijuana, or drugs. What are the risks? Generally, this is a safe procedure. However, problems may occur, including: Getting too much medicine (oversedation). Nausea. Allergic reaction to medicines. Trouble breathing. If this happens, a breathing tube may be used. It will be removed when you are awake and breathing on your own. Heart trouble. Lung trouble. Confusion that gets better with time (emergence delirium). What happens before the procedure? Staying hydrated Follow instructions from your health care provider about hydration, which may include: Up to 2 hours before the procedure - you may continue to drink clear liquids, such as water, clear fruit juice, black coffee, and plain tea. Eating and drinking  restrictions Follow instructions from your health care provider about eating and drinking, which may include: 8 hours before the procedure - stop eating heavy meals or foods, such as meat, fried foods, or fatty foods. 6 hours before the procedure - stop eating light meals or foods, such as toast or cereal. 6 hours before the procedure - stop drinking milk or drinks that contain milk. 2 hours before the procedure - stop drinking clear liquids. Medicines Ask your health care provider about: Changing or stopping your regular medicines. This is especially important if you are taking diabetes medicines or blood thinners. Taking medicines such as aspirin and ibuprofen. These medicines can thin your blood. Do not take these medicines unless your health care provider tells you to take them. Taking over-the-counter medicines, vitamins, herbs, and supplements. Tests and exams You will have a physical exam. You may have blood tests done to show how well: Your kidneys and liver work. Your blood clots. General instructions Plan to have a responsible adult take you home from the hospital or clinic. If you will be going home right after the procedure, plan to have a responsible adult care for you for the time you are told. This is important. What happens during the procedure?  You will be given the sedative. The sedative may be given: As a pill that you will swallow. It can also be inserted into the rectum. As a spray through the nose. As an injection into the muscle. As an injection into the vein through an IV. You may be given oxygen as needed. Your breathing, heart rate, and blood pressure will be monitored during the procedure. The medical or dental procedure will be done. The procedure may vary among health   care providers and hospitals. What happens after the procedure? Your blood pressure, heart rate, breathing rate, and blood oxygen level will be monitored until you leave the hospital or  clinic. You will get fluids through your IV if needed. Do not drive or operate machinery until your health care provider says that it is safe. Summary Sedation is the use of medicines to promote relaxation and to relieve discomfort and anxiety. Moderate conscious sedation is a type of sedation that is used during short medical and dental procedures. Tell the health care provider about any medical conditions that you have and about all the medicines that you are taking. You will be given the sedative as a pill, a spray through the nose, an injection into the muscle, or an injection into the vein through an IV. Vital signs are monitored during the sedation. Moderate conscious sedation allows you to return to your regular activities sooner. This information is not intended to replace advice given to you by your health care provider. Make sure you discuss any questions you have with your health care provider. Document Revised: 07/02/2019 Document Reviewed: 01/28/2019 Elsevier Patient Education  2022 [place]  What are the risk, side effects and possible complications? Generally speaking, most procedures are safe.  However, with any procedure there are risks, side effects, and the possibility of complications.  The risks and complications are dependent upon the sites that are lesioned, or the type of nerve block to be performed.  The closer the procedure is to the spine, the more serious the risks are.  Great care is taken when placing the radio frequency needles, block needles or lesioning probes, but sometimes complications can occur. Infection: Any time there is an injection through the skin, there is a risk of infection.  This is why sterile conditions are used for these blocks.  There are four possible types of infection. Localized skin infection. Central Nervous System Infection-This can be in the form of Meningitis, which can be deadly. Epidural  Infections-This can be in the form of an epidural abscess, which can cause pressure inside of the spine, causing compression of the spinal cord with subsequent paralysis. This would require an emergency surgery to decompress, and there are no guarantees that the patient would recover from the paralysis. Discitis-This is an infection of the intervertebral discs.  It occurs in about 1% of discography procedures.  It is difficult to treat and it may lead to surgery.        2. Pain: the needles have to go through skin and soft tissues, will cause soreness.       3. Damage to internal structures:  The nerves to be lesioned may be near blood vessels or    other nerves which can be potentially damaged.       4. Bleeding: Bleeding is more common if the patient is taking blood thinners such as  aspirin, Coumadin, Ticiid, Plavix, etc., or if he/she have some genetic predisposition  such as hemophilia. Bleeding into the spinal canal can cause compression of the spinal  cord with subsequent paralysis.  This would require an emergency surgery to  decompress and there are no guarantees that the patient would recover from the  paralysis.       5. Pneumothorax:  Puncturing of a lung is a possibility, every time a needle is introduced in  the area of the chest or upper back.  Pneumothorax refers to free air around the  collapsed lung(s),  inside of the thoracic cavity (chest cavity).  Another two possible  complications related to a similar event would include: Hemothorax and Chylothorax.   These are variations of the Pneumothorax, where instead of air around the collapsed  lung(s), you may have blood or chyle, respectively.       6. Spinal headaches: They may occur with any procedures in the area of the spine.       7. Persistent CSF (Cerebro-Spinal Fluid) leakage: This is a rare problem, but may occur  with prolonged intrathecal or epidural catheters either due to the formation of a fistulous  track or a dural tear.        8. Nerve damage: By working so close to the spinal cord, there is always a possibility of  nerve damage, which could be as serious as a permanent spinal cord injury with  paralysis.       9. Death:  Although rare, severe deadly allergic reactions known as "Anaphylactic  reaction" can occur to any of the medications used.      10. Worsening of the symptoms:  We can always make thing worse.  What are the chances of something like this happening? Chances of any of this occuring are extremely low.  By statistics, you have more of a chance of getting killed in a motor vehicle accident: while driving to the hospital than any of the above occurring .  Nevertheless, you should be aware that they are possibilities.  In general, it is similar to taking a shower.  Everybody knows that you can slip, hit your head and get killed.  Does that mean that you should not shower again?  Nevertheless always keep in mind that statistics do not mean anything if you happen to be on the wrong side of them.  Even if a procedure has a 1 (one) in a 1,000,000 (million) chance of going wrong, it you happen to be that one..Also, keep in mind that by statistics, you have more of a chance of having something go wrong when taking medications.  Who should not have this procedure? If you are on a blood thinning medication (e.g. Coumadin, Plavix, see list of "Blood Thinners"), or if you have an active infection going on, you should not have the procedure.  If you are taking any blood thinners, please inform your physician.  How should I prepare for this procedure? Do not eat or drink anything at least six hours prior to the procedure. Bring a driver with you .  It cannot be a taxi. Come accompanied by an adult that can drive you back, and that is strong enough to help you if your legs get weak or numb from the local anesthetic. Take all of your medicines the morning of the procedure with just enough water to swallow them. If you have  diabetes, make sure that you are scheduled to have your procedure done first thing in the morning, whenever possible. If you have diabetes, take only half of your insulin dose and notify our nurse that you have done so as soon as you arrive at the clinic. If you are diabetic, but only take blood sugar pills (oral hypoglycemic), then do not take them on the morning of your procedure.  You may take them after you have had the procedure. Do not take aspirin or any aspirin-containing medications, at least eleven (11) days prior to the procedure.  They may prolong bleeding. Wear loose fitting clothing that may be easy to take  off and that you would not mind if it got stained with Betadine or blood. Do not wear any jewelry or perfume Remove any nail coloring.  It will interfere with some of our monitoring equipment.  NOTE: Remember that this is not meant to be interpreted as a complete list of all possible complications.  Unforeseen problems may occur.  BLOOD THINNERS The following drugs contain aspirin or other products, which can cause increased bleeding during surgery and should not be taken for 2 weeks prior to and 1 week after surgery.  If you should need take something for relief of minor pain, you may take acetaminophen which is found in Tylenol,m Datril, Anacin-3 and Panadol. It is not blood thinner. The products listed below are.  Do not take any of the products listed below in addition to any listed on your instruction sheet.  A.P.C or A.P.C with Codeine Codeine Phosphate Capsules #3 Ibuprofen Ridaura  ABC compound Congesprin Imuran rimadil  Advil Cope Indocin Robaxisal  Alka-Seltzer Effervescent Pain Reliever and Antacid Coricidin or Coricidin-D  Indomethacin Rufen  Alka-Seltzer plus Cold Medicine Cosprin Ketoprofen S-A-C Tablets  Anacin Analgesic Tablets or Capsules Coumadin Korlgesic Salflex  Anacin Extra Strength Analgesic tablets or capsules CP-2 Tablets Lanoril Salicylate  Anaprox  Cuprimine Capsules Levenox Salocol  Anexsia-D Dalteparin Magan Salsalate  Anodynos Darvon compound Magnesium Salicylate Sine-off  Ansaid Dasin Capsules Magsal Sodium Salicylate  Anturane Depen Capsules Marnal Soma  APF Arthritis pain formula Dewitt's Pills Measurin Stanback  Argesic Dia-Gesic Meclofenamic Sulfinpyrazone  Arthritis Bayer Timed Release Aspirin Diclofenac Meclomen Sulindac  Arthritis pain formula Anacin Dicumarol Medipren Supac  Analgesic (Safety coated) Arthralgen Diffunasal Mefanamic Suprofen  Arthritis Strength Bufferin Dihydrocodeine Mepro Compound Suprol  Arthropan liquid Dopirydamole Methcarbomol with Aspirin Synalgos  ASA tablets/Enseals Disalcid Micrainin Tagament  Ascriptin Doan's Midol Talwin  Ascriptin A/D Dolene Mobidin Tanderil  Ascriptin Extra Strength Dolobid Moblgesic Ticlid  Ascriptin with Codeine Doloprin or Doloprin with Codeine Momentum Tolectin  Asperbuf Duoprin Mono-gesic Trendar  Aspergum Duradyne Motrin or Motrin IB Triminicin  Aspirin plain, buffered or enteric coated Durasal Myochrisine Trigesic  Aspirin Suppositories Easprin Nalfon Trillsate  Aspirin with Codeine Ecotrin Regular or Extra Strength Naprosyn Uracel  Atromid-S Efficin Naproxen Ursinus  Auranofin Capsules Elmiron Neocylate Vanquish  Axotal Emagrin Norgesic Verin  Azathioprine Empirin or Empirin with Codeine Normiflo Vitamin E  Azolid Emprazil Nuprin Voltaren  Bayer Aspirin plain, buffered or children's or timed BC Tablets or powders Encaprin Orgaran Warfarin Sodium  Buff-a-Comp Enoxaparin Orudis Zorpin  Buff-a-Comp with Codeine Equegesic Os-Cal-Gesic   Buffaprin Excedrin plain, buffered or Extra Strength Oxalid   Bufferin Arthritis Strength Feldene Oxphenbutazone   Bufferin plain or Extra Strength Feldene Capsules Oxycodone with Aspirin   Bufferin with Codeine Fenoprofen Fenoprofen Pabalate or Pabalate-SF   Buffets II Flogesic Panagesic   Buffinol plain or Extra Strength Florinal  or Florinal with Codeine Panwarfarin   Buf-Tabs Flurbiprofen Penicillamine   Butalbital Compound Four-way cold tablets Penicillin   Butazolidin Fragmin Pepto-Bismol   Carbenicillin Geminisyn Percodan   Carna Arthritis Reliever Geopen Persantine   Carprofen Gold's salt Persistin   Chloramphenicol Goody's Phenylbutazone   Chloromycetin Haltrain Piroxlcam   Clmetidine heparin Plaquenil   Cllnoril Hyco-pap Ponstel   Clofibrate Hydroxy chloroquine Propoxyphen         Before stopping any of these medications, be sure to consult the physician who ordered them.  Some, such as Coumadin (Warfarin) are ordered to prevent or treat serious conditions such as "deep thrombosis", "pumonary embolisms", and other  heart problems.  The amount of time that you may need off of the medication may also vary with the medication and the reason for which you were taking it.  If you are taking any of these medications, please make sure you notify your pain physician before you undergo any procedures.         Spinal Cord Stimulation Trial Information A spinal cord stimulation trial is a test to see whether a spinal cord stimulator reduces your pain. A spinal cord stimulator is a small device that is inserted (implanted) in your back. The stimulator has small wires (leads) that connect it to your spinal cord. The stimulator sends electrical pulses through the leads to the spinal cord. This can relieve pain. Settings for the stimulator can be adjusted with a remote device to find the best pain control. Your health care provider may suggest a spinal cord stimulation trial if other treatments for long-lasting (chronic) pain have not worked for you. Spinal cord stimulation may be used to manage pain that is caused by: Coronary artery disease or peripheral vascular disease. Failed back surgery. Phantom limb sensation. Peripheral neuropathy. Complex regional pain syndrome. Other syndromes that involve chronic pain. For  the trial, the stimulator is attached to your back instead of inserted under the skin. A trial period is usually 3-5 days, but this can vary among health care providers. After your trial period, you and your health care provider will discuss whether a permanent spinal cord stimulator is an option for you. The permanent stimulator may be an option depending on: Whether the stimulator reduces your pain during the trial. Whether the stimulator fits into your lifestyle. Whether the cost of the stimulator is covered by your insurance. What are the risks? Generally, a spinal cord stimulation trial is safe. However, problems can occur, including: Bleeding or pain at the insertion site of the leads. Infection at the insertion site or around the leads. Allergic reactions to medicines, devices, or dyes. Damage to the skin, nerves, back muscles, or spinal cord where the leads are placed. Inability to move the legs (paralysis). Numbness in the legs. Inability to control when you urinate or have a bowel movement (incontinence). Spinal fluid leakage. How is a spinal cord stimulator placed for a trial? For a trial period, the stimulator is placed on your skin, not under it. Only the leads that connect the stimulator to the spinal cord are implanted under your skin. The exact location of the stimulator depends on where you have pain. There are two types of surgery for implanting the leads: Noninvasive surgery. In this type of surgery, a small incision is made and needles are used to place the leads under your skin. Open surgery. In this type of surgery, a larger incision is made, and the leads are implanted directly into your back. How should I care for myself during the trial period? Activity Return to your normal activities as told by your health care provider. Ask your health care provider what activities are safe for you. Do not lift anything that is heavier than 10 lb (4.5 kg), or the limit that you are  told. General instructions Follow your health care provider's specific instructions about how to take care of your spinal cord stimulator and your incision. Make sure you write down the following information so that you can share this information with your health care provider: Your responses to the stimulator. Describe these as told by your health care provider. Your pain level throughout the  day. The amount and kind of pain medicine that you take. Take over-the-counter and prescription medicines only as told by your health care provider. Do not take baths, swim, or use a hot tub until your health care provider approves. Tell all health care providers who provide care for you that you have a spinal cord stimulator. This is important information that could affect the medical treatment that you receive. Keep all follow-up visits as told by your health care provider. This is important. When should I seek medical care? During the trial, seek medical care if: You have redness, swelling, or pain around your incision. You have fluid or blood coming from your incision. Your incision feels warm to the touch. You have pus or a bad smell coming from your incision. The bandage (dressing) that covers your incision comes off. Get help right away if: Your pain gets worse. The stimulator leads come out. You develop numbness or weakness in your legs, or you have difficulty walking. You have problems urinating or having a bowel movement. You have a fever. You have symptoms that last for more than 2-3 days. Your symptoms suddenly get worse. Summary A spinal cord stimulator is a small device that sends electrical pulses to your spinal cord. This can relieve pain caused by many different health conditions. Before a permanent stimulator is placed, you will have a trial using a temporary stimulator that is not implanted under your skin. This helps determine if a stimulator will reduce your pain. For the  trial, only the leads that connect the stimulator to the spinal cord are implanted under your skin. During the trial period, make sure you write down information about your pain and your responses to the stimulator so that you can share this information with your health care provider. Keep all follow-up visits as told by your health care provider. This is important. Contact your health care provider if you have symptoms that indicate a problem. This information is not intended to replace advice given to you by your health care provider. Make sure you discuss any questions you have with your health care provider. Document Revised: 11/27/2018 Document Reviewed: 04/08/2018 Elsevier Patient Education  2022 [employer].

## 2021-01-08 ENCOUNTER — Other Ambulatory Visit: Payer: Self-pay

## 2021-01-08 ENCOUNTER — Encounter (HOSPITAL_BASED_OUTPATIENT_CLINIC_OR_DEPARTMENT_OTHER): Payer: Self-pay | Admitting: Obstetrics & Gynecology

## 2021-01-08 ENCOUNTER — Ambulatory Visit (INDEPENDENT_AMBULATORY_CARE_PROVIDER_SITE_OTHER): Payer: PPO | Admitting: Obstetrics & Gynecology

## 2021-01-08 VITALS — BP 149/73 | HR 79 | Ht 68.0 in | Wt 245.0 lb

## 2021-01-08 DIAGNOSIS — Z780 Asymptomatic menopausal state: Secondary | ICD-10-CM

## 2021-01-08 DIAGNOSIS — N3281 Overactive bladder: Secondary | ICD-10-CM | POA: Diagnosis not present

## 2021-01-08 DIAGNOSIS — Z1589 Genetic susceptibility to other disease: Secondary | ICD-10-CM | POA: Insufficient documentation

## 2021-01-08 DIAGNOSIS — Z90710 Acquired absence of both cervix and uterus: Secondary | ICD-10-CM

## 2021-01-08 DIAGNOSIS — N952 Postmenopausal atrophic vaginitis: Secondary | ICD-10-CM

## 2021-01-08 DIAGNOSIS — Z1231 Encounter for screening mammogram for malignant neoplasm of breast: Secondary | ICD-10-CM

## 2021-01-08 DIAGNOSIS — M459 Ankylosing spondylitis of unspecified sites in spine: Secondary | ICD-10-CM | POA: Insufficient documentation

## 2021-01-08 NOTE — Progress Notes (Signed)
55 y.o. G3P3003 Single White or Caucasian female here for new patient exam.  I have seen pt in the past.  Last visit was 04/14/2017.  Had another surgery on her right food for achilles tendon stretching.  Feet are now 8/12 and 12 sizes.  Falls more frequently now.     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  This will end at the end of the week.  Stopped working at [employer].  Just couldn't stand for so long.  Diagnosed with ankylosing spondyltis with Dr. Trudie Reed at [facility].  Tested positive for HLA-B27.  Pt isn't really sure about this diagnosis.  Reports she does not always understand everything Dr. Trudie Reed says.  Is on Humira.  Feels is really causing skin issues.  Will get release of records for better clarification and lab work.  Feel she's had testing for HIV and Hepatitis prior to starting.  Would like this for updating care gaps.  Patient's last menstrual period was 03/18/2004.          Sexually active: No.  The current method of family planning is post menopausal status.    Exercising: no regular Smoker:  no  Health Maintenance: Pap:  08/07/15 neg History of abnormal Pap:  remote hx MMG:  12/31/18 neg Cologuard: 06/26/20 neg TDaP:  2015 Shingrix:   01/10/18, 04/06/18 Hep C testing: done prior to starting Humira  Screening Labs: does with Dr. Wolfgang Phoenix   reports that she quit smoking about 20 years ago. Her smoking use included cigarettes. She has never used smokeless tobacco. She reports that she does not currently use alcohol. She reports that she does not use drugs.  Past Medical History:  Diagnosis Date   ADHD (attention deficit hyperactivity disorder)    Arthritis    Back pain    Broken femur ([place]) 08/27/12   Chronic pain    chronic opioid use   GERD (gastroesophageal reflux disease)    Migraine    Osteoarthritis    Raynaud disease    Raynaud phenomenon 04/06/2018   [facility] podiatry diagnosed 2020    Past Surgical History:  Procedure Laterality  Date   ACHILLES TENDON LENGTHENING Right 02/01/2020   Procedure: ACHILLES TENDON LENGTHENING;  Surgeon: Erle Crocker, MD;  Location: [facility];  Service: Orthopedics;  Laterality: Right;   BACK SURGERY     CERVICAL BIOPSY  W/ LOOP ELECTRODE EXCISION  1990's   [place] BIOPSY  04-05-05 and 02-12-06   04-05-05 benign but too rapid growth. RXd Provera and repeat Bx 74mo 02-12-06 Bx benign--Dr. SEdwinna Areola  FEMUR IM NAIL Left 08/27/2012   Procedure: INTRAMEDULLARY (IM) RETROGRADE FEMORAL NAILING;  Surgeon: CMcarthur Rossetti MD;  Location: [facility]  Service: Orthopedics;  Laterality: Left;   FOOT ARTHRODESIS Right 02/01/2020   Procedure: TRIPLE ARTHRODESIS,  TENDO ACHILLES LENGTHENING;  Surgeon: AErle Crocker MD;  Location[place] [facility]  Service: Orthopedics;  Laterality: Right;  LENGTH OF SURGERY: 4 HOURS   HEEL SPUR SURGERY     IM NAILING FEMORAL SHAFT FRACTURE Left 08/27/2012   Dr BNinfa Linden  SHOULDER SURGERY Left    TOTAL KNEE ARTHROPLASTY     x2   TUBAL LIGATION     VAGINAL HYSTERECTOMY      Current Outpatient Medications  Medication Sig Dispense Refill   amLODipine (NORVASC) 2.5 MG tablet Take 1 tablet (2.5 mg total) by mouth daily. 90 tablet 1   amphetamine-dextroamphetamine (ADDERALL XR)  30 MG 24 hr capsule TAKE 2 CAPSULES BY MOUTH EACH MORNING. 60 capsule 0   Diclofenac Sodium CR 100 MG 24 hr tablet TAKE 1 TABLET BY MOUTH TWICE DAILY. TAKE WITH FOOD (Patient taking differently: 200 mg. TAKE 2 TABLET BY MOUTH TWICE DAILY. TAKE WITH FOOD) 60 tablet 12   DULoxetine (CYMBALTA) 60 MG capsule TAKE 2 CAPSULES BY MOUTH DAILY. 180 capsule 1   lidocaine (XYLOCAINE) 5 % ointment Apply small amount externally up two twice daily.  Do not use for more than 5 days in a row. 35.44 g 4   methocarbamol (ROBAXIN) 750 MG tablet Take 750 mg by mouth as needed for muscle spasms.     nystatin (MYCOSTATIN/NYSTOP) powder May apply as  needed several times per day 120 g 5   omeprazole (PRILOSEC) 40 MG capsule 1 qd 90 capsule 3   [START ON 03/26/2021] oxyCODONE-acetaminophen (PERCOCET) 10-325 MG tablet Take 1 tablet by mouth every 6 (six) hours as needed for pain. Must last 30 days. 120 tablet 0   traZODone (DESYREL) 100 MG tablet 1 to 2 qhs for sleep caution drowsiness 180 tablet 3   Oxybutynin Chloride 10 % GEL Apply daily to skin for overactive bladder (Patient not taking: Reported on 01/08/2021) 1 g 5   No current facility-administered medications for this visit.    Family History  Problem Relation Age of Onset   Cancer Mother    Breast cancer Mother    Bipolar disorder Mother    Heart failure Father    Cancer Father    Cancer Other    Heart failure Other    Bipolar disorder Sister     Review of Systems  All other systems reviewed and are negative.  Exam:   BP (!) 149/73   Pulse 79   Ht 5' 8" (1.727 m)   Wt 245 lb (111.1 kg)   LMP 03/18/2004   BMI 37.25 kg/m   Height: 5' 8" (172.7 cm)  General appearance: alert, cooperative and appears stated age Head: Normocephalic, without obvious abnormality, atraumatic Neck: no adenopathy, supple, symmetrical, trachea midline and thyroid normal to inspection and palpation Lungs: clear to auscultation bilaterally Breasts: normal appearance, no masses or tenderness Heart: regular rate and rhythm Abdomen: soft, non-tender; bowel sounds normal; no masses,  no organomegaly Extremities: extremities normal, atraumatic, no cyanosis or edema Skin: Skin color, texture, turgor normal. No rashes or lesions Lymph nodes: Cervical, supraclavicular, and axillary nodes normal. No abnormal inguinal nodes palpated Neurologic: Grossly normal   Pelvic: External genitalia:  no lesions              Urethra:  normal appearing urethra with no masses, tenderness or lesions              Bartholins and Skenes: normal                 Vagina: atrophic appearance, shortened vagina               Cervix: absent              Pap taken: No. Bimanual Exam:  Uterus:  uterus absent              Adnexa: no mass, fullness, tenderness               Rectovaginal: Confirms               Anus:  normal sphincter tone, no lesions  Chaperone, Octaviano Batty, CMA, was present  for exam.  Assessment/Plan: 1. Postmenopausal - no HRT - pap smear not indicated  2. Encounter for screening mammogram for malignant neoplasm of breast - MM 3D SCREEN BREAST BILATERAL; Future - will see if we can schedule this at [facility] for her  3. H/O: hysterectomy - h/o TLH/A&P repair with Dr. Rodman Key at [facility]  4. OAB (overactive bladder) - has been on medication in the past with side effects.    5. Vaginal atrophy/shortened vagina  6.  Ankylosing Spondylitis  - release signed for records to review what infectious disease testing has been completed  Total time with pt, with record review, and documentation:  33 minutes

## 2021-01-11 DIAGNOSIS — Z1231 Encounter for screening mammogram for malignant neoplasm of breast: Secondary | ICD-10-CM | POA: Diagnosis not present

## 2021-01-11 LAB — HM MAMMOGRAPHY: HM Mammogram: NORMAL (ref 0–4)

## 2021-01-16 DIAGNOSIS — M150 Primary generalized (osteo)arthritis: Secondary | ICD-10-CM | POA: Diagnosis not present

## 2021-01-16 DIAGNOSIS — E669 Obesity, unspecified: Secondary | ICD-10-CM | POA: Diagnosis not present

## 2021-01-16 DIAGNOSIS — I7300 Raynaud's syndrome without gangrene: Secondary | ICD-10-CM | POA: Diagnosis not present

## 2021-01-16 DIAGNOSIS — Z1589 Genetic susceptibility to other disease: Secondary | ICD-10-CM | POA: Diagnosis not present

## 2021-01-16 DIAGNOSIS — Z6838 Body mass index (BMI) 38.0-38.9, adult: Secondary | ICD-10-CM | POA: Diagnosis not present

## 2021-01-16 DIAGNOSIS — M459 Ankylosing spondylitis of unspecified sites in spine: Secondary | ICD-10-CM | POA: Diagnosis not present

## 2021-01-16 DIAGNOSIS — G894 Chronic pain syndrome: Secondary | ICD-10-CM | POA: Diagnosis not present

## 2021-01-17 ENCOUNTER — Encounter: Payer: Self-pay | Admitting: Family Medicine

## 2021-01-17 ENCOUNTER — Other Ambulatory Visit: Payer: Self-pay | Admitting: Family Medicine

## 2021-01-24 ENCOUNTER — Encounter: Payer: Self-pay | Admitting: Student in an Organized Health Care Education/Training Program

## 2021-01-24 ENCOUNTER — Other Ambulatory Visit: Payer: Self-pay

## 2021-01-24 ENCOUNTER — Ambulatory Visit (HOSPITAL_BASED_OUTPATIENT_CLINIC_OR_DEPARTMENT_OTHER): Payer: PPO | Admitting: Student in an Organized Health Care Education/Training Program

## 2021-01-24 ENCOUNTER — Ambulatory Visit
Admission: RE | Admit: 2021-01-24 | Discharge: 2021-01-24 | Disposition: A | Discharge status: Home / Self Care | Payer: PPO | Source: Ambulatory Visit | Attending: Student in an Organized Health Care Education/Training Program | Admitting: Student in an Organized Health Care Education/Training Program

## 2021-01-24 DIAGNOSIS — G8929 Other chronic pain: Secondary | ICD-10-CM | POA: Insufficient documentation

## 2021-01-24 DIAGNOSIS — M48062 Spinal stenosis, lumbar region with neurogenic claudication: Secondary | ICD-10-CM | POA: Insufficient documentation

## 2021-01-24 DIAGNOSIS — M961 Postlaminectomy syndrome, not elsewhere classified: Secondary | ICD-10-CM | POA: Insufficient documentation

## 2021-01-24 DIAGNOSIS — Z9889 Other specified postprocedural states: Secondary | ICD-10-CM

## 2021-01-24 DIAGNOSIS — M5416 Radiculopathy, lumbar region: Secondary | ICD-10-CM | POA: Diagnosis not present

## 2021-01-24 DIAGNOSIS — G894 Chronic pain syndrome: Secondary | ICD-10-CM | POA: Insufficient documentation

## 2021-01-24 MED ORDER — ROPIVACAINE HCL 2 MG/ML IJ SOLN
20.0000 mL | Freq: Once | INTRAMUSCULAR | Status: AC
  Administered 2021-01-24: 20 mL

## 2021-01-24 MED ORDER — LIDOCAINE HCL 2 % IJ SOLN
20.0000 mL | Freq: Once | INTRAMUSCULAR | Status: AC
  Administered 2021-01-24: 100 mg

## 2021-01-24 MED ORDER — MIDAZOLAM HCL 2 MG/2ML IJ SOLN
1.0000 mg | INTRAMUSCULAR | Status: DC | PRN
  Administered 2021-01-24: 1 mg via INTRAVENOUS
  Filled 2021-01-24 (×2): qty 2

## 2021-01-24 MED ORDER — MIDAZOLAM HCL 5 MG/5ML IJ SOLN
INTRAMUSCULAR | Status: AC
  Filled 2021-01-24: qty 5

## 2021-01-24 MED ORDER — LACTATED RINGERS IV SOLN
1000.0000 mL | Freq: Once | INTRAVENOUS | Status: AC
  Administered 2021-01-24: 1000 mL via INTRAVENOUS

## 2021-01-24 MED ORDER — FENTANYL CITRATE (PF) 100 MCG/2ML IJ SOLN
INTRAMUSCULAR | Status: AC
  Filled 2021-01-24: qty 2

## 2021-01-24 MED ORDER — CEPHALEXIN 500 MG PO CAPS: 500.0000 mg | ORAL_CAPSULE | Freq: Four times a day (QID) | ORAL | 0 refills | Status: AC

## 2021-01-24 MED ORDER — CEFAZOLIN SODIUM 1 G IJ SOLR
INTRAMUSCULAR | Status: AC
  Filled 2021-01-24: qty 20

## 2021-01-24 MED ORDER — FENTANYL CITRATE (PF) 100 MCG/2ML IJ SOLN
25.0000 ug | INTRAMUSCULAR | Status: DC | PRN
  Administered 2021-01-24: 50 ug via INTRAVENOUS

## 2021-01-24 MED ORDER — CEFAZOLIN SODIUM-DEXTROSE 2-4 GM/100ML-% IV SOLN
2.0000 g | Freq: Once | INTRAVENOUS | Status: AC
  Administered 2021-01-24: 2 g via INTRAVENOUS
  Filled 2021-01-24: qty 100

## 2021-01-24 NOTE — Progress Notes (Signed)
Safety precautions to be maintained throughout the outpatient stay will include: orient to surroundings, keep bed in low position, maintain call bell within reach at all times, provide assistance with transfer out of bed and ambulation.  

## 2021-01-24 NOTE — Progress Notes (Signed)
1040 Wasted 4mg  Versed and of Fentanyl in stericycle with RN as witness.

## 2021-01-24 NOTE — Patient Instructions (Signed)
Today we did the following -We have done a Spinal Cord Stimulator Trial with [employer]  -As long as the leads are in place, do not bathe or shower. You may sponge bathe.  -While the lead is in place, please limit the bending, lifting, or twisting because the lead can move.  -The things we want to see is if your pain improves (and by what percentage), if you can do more activity (don't overdo it), and if you can use less of your "as needed" medicine. Do not stop long acting medicines like methadone, oxycontin, MS Contin, etc without checking with [place].  -It is VERY important that you pick up the antibiotics we prescribed, Keflex, on your way home from the trial and take them as prescribed(4 times a day), starting today, for as long as the lead is in place.  -The Spina Cord Stimulator Representative will be in contact with you while the lead is in place to make sure the trial goes as well as possible.  -Please contact us with any questions or concerns at any time during the trial.   -If you start running a fever over 100 degrees, have severe back pain, or new pain running down the legs, or drainage coming from the lead site, contact us immediately and/or go to the emergency room.  -Please do not restart any sort of medication that can thin your blood such as Aspirin, ibuprofen, motrin, aleve, plavix, coumadin, etc. If you aren't sure, call and ask.  -We will have you return on 01/31/21 to have the lead removed. If this is successful, at that point we can go over the details about the permanent implant.

## 2021-01-24 NOTE — Progress Notes (Signed)
PROVIDER NOTE: Information contained herein reflects review and annotations entered in association with encounter. Interpretation of such information and data should be left to medically-trained personnel. Information provided to patient can be located elsewhere in the medical record under "Patient Instructions". Document created using STT-dictation technology, any transcriptional errors that may result from process are unintentional.    Patient: Lauren Greene  Service Category: Procedure Provider: Edward Jolly, MD DOB: 12-Nov-1965 DOS: 01/24/2021 Location: [facility] MRN: [id number] Setting: Ambulatory - outpatient Referring Provider: Edward Jolly, MD Type: Established Patient Specialty: Interventional Pain Management PCP: Babs Sciara, MD  Primary Reason for Admission: Surgical management of chronic pain condition.  Procedure:  Anesthesia, Analgesia, Anxiolysis:  Type: [employer] Trial Spinal Cord Neurostimulator Implant (Percutaneous, interlaminar, posterior epidural placement) Purpose: To determine if a permanent implant may be effective in controlling some or all of Lauren Greene's chronic pain symptoms.  Region: Lumbar Level: T12-L1 Laterality: Bilateral Paramedial  Anesthesia: Local (1-2% Lidocaine)  Anxiolysis: IV  Sedation: Moderate  Guidance: Fluoroscopy            Indications: 1. Failed back surgical syndrome   2. S/P lumbar microdiscectomy (left L4/5 with micordissection)   3. Spinal stenosis, lumbar region, with neurogenic claudication   4. Chronic radicular lumbar pain   5. Lumbar radiculopathy   6. Chronic pain syndrome    Pain Score: Pre-procedure: 7 /10 Post-procedure: 0-No pain/10   Pre-op H&P Assessment:  Lauren Greene is a 55 y.o. (year old), female patient, seen today for interventional treatment. She  has a past surgical history that includes Total knee arthroplasty; Heel spur surgery; Back surgery; IM nailing femoral shaft fracture (Left,  08/27/2012); Femur IM nail (Left, 08/27/2012); Cervical biopsy w/ loop electrode excision (1990's); Endometrial biopsy (04-05-05 and 02-12-06); Tubal ligation; Shoulder surgery (Left); Vaginal hysterectomy; Foot arthrodesis (Right, 02/01/2020); and Achilles tendon lengthening (Right, 02/01/2020).  Initial Vital Signs:  Pulse/EKG Rate:  ECG Heart Rate: (!) 55 Temp: (!) 97.2 F (36.2 C) Resp: 18 BP: (!) 156/85 SpO2: 97 %  BMI: Estimated body mass index is 37.25 kg/m as calculated from the following:   Height as of this encounter: 5\' 8"  (1.727 m).   Weight as of this encounter: 245 lb (111.1 kg).  Risk Assessment: Allergies: Reviewed. She is allergic to wellbutrin [bupropion] and lyrica [pregabalin].  Allergy Precautions: None required Coagulopathies: Reviewed. None identified.  Blood-thinner therapy: None at this time Active Infection(s): Reviewed. None identified. Lauren Greene is afebrile  Site Confirmation: Lauren Greene was asked to confirm the procedure and laterality before marking the site, which she did. Procedure checklist: Completed Consent: Before the procedure and under the influence of no sedative(s), amnesic(s), or anxiolytics, the patient was informed of the treatment options, risks and possible complications. To fulfill our ethical and legal obligations, as recommended by the American Medical Association's Code of Ethics, I have informed the patient of my clinical impression; the nature and purpose of the treatment or procedure; the risks, benefits, and possible complications of the intervention; the alternatives, including doing nothing; the risk(s) and benefit(s) of the alternative treatment(s) or procedure(s); and the risk(s) and benefit(s) of doing nothing.  Lauren Greene was provided with information about the general risks and possible complications associated with most interventional procedures. These include, but are not limited to: failure to achieve desired goals,  infection, bleeding, organ or nerve damage, allergic reactions, paralysis, and/or death.  In addition, she was informed of those risks and possible complications associated to this particular procedure,  which include, but are not limited to: damage to the implant; failure to decrease pain; local, systemic, or serious CNS infections, intraspinal abscess with possible cord compression and paralysis, or life-threatening such as meningitis; intrathecal and/or epidural bleeding with formation of hematoma with possible spinal cord compression and permanent paralysis; organ damage; nerve injury or damage with subsequent sensory, motor, and/or autonomic system dysfunction, resulting in transient or permanent pain, numbness, and/or weakness of one or several areas of the body; allergic reactions, either minor or major life-threatening, such as anaphylactic or anaphylactoid reactions.  Furthermore, Lauren Greene was informed of those risks and complications associated with the medications. These include, but are not limited to: allergic reactions (i.e.: anaphylactic or anaphylactoid reactions); arrhythmia;  Hypotension/hypertension; cardiovascular collapse; respiratory depression and/or shortness of breath; swelling or edema; medication-induced neural toxicity; particulate matter embolism and blood vessel occlusion with resultant organ, and/or nervous system infarction and permanent paralysis.  Finally, she was informed that Medicine is not an exact science; therefore, there is also the possibility of unforeseen or unpredictable risks and/or possible complications that may result in a catastrophic outcome. The patient indicated having understood very clearly. We have given the patient no guarantees and we have made no promises. Enough time was given to the patient to ask questions, all of which were answered to the patient's satisfaction. Lauren Greene has indicated that she wanted to continue with the  procedure. Attestation: I, the ordering provider, attest that I have discussed with the patient the benefits, risks, side-effects, alternatives, likelihood of achieving goals, and potential problems during recovery for the procedure that I have provided informed consent. Date  Time: 01/24/2021  8:02 AM  Pre-Procedure Preparation:  Monitoring: As per clinic protocol. Respiration, ETCO2, SpO2, BP, heart rate and rhythm monitor placed and checked for adequate function Safety Precautions: Patient was assessed for positional comfort and pressure points before starting the procedure. Time-out: I initiated and conducted the "Time-out" before starting the procedure, as per protocol. The patient was asked to participate by confirming the accuracy of the "Time Out" information. Verification of the correct person, site, and procedure were performed and confirmed by me, the nursing staff, and the patient. "Time-out" conducted as per Joint Commission's Universal Protocol (UP.01.01.01). Time: 0934  Description of Procedure Process:   Position: Prone Target Area: Posterior epidural space Approach: Posterior percutaneous, paramedial, interlaminar approach Area Prepped: Bilateral thoraco-lumbar Region Prepping solution: ChloraPrep (2% chlorhexidine gluconate and 70% isopropyl alcohol) Safety Precautions: Safe injection practices and needle disposal techniques used. Medications properly checked for expiration dates. SDV (single dose vial) medications used. Aspiration looking for blood return and/or CSF was conducted prior to all injections. At no point did I inject any substances, as a needle was being advanced. No attempts were made at seeking any paresthesias.  Description of the Procedure: Availability of a responsible, adult driver, and NPO status confirmed. Informed consent was obtained after having discussed risks and possible complications. An IV was started. The patient was then taken to the fluoroscopy  suite, where the patient was placed in position for the procedure, over the fluoroscopy table. The patient was then monitored in the usual manner. Fluoroscopy was manipulated to obtain the best possible view of the target. Parallex error was corrected before commencing the procedure. Once a clear view of the target had been obtained, the skin and deeper tissues over the procedure site were infiltrated using lidocaine, loaded in a 10 cc luer-loc syringe with a 0.5 inch, 25-G needle. The introducer needle(s) was/were then  inserted through the skin and deeper tissues. A paramidline approach was used to enter the posterior epidural space at a 30 angle, using "Loss-of-resistance Technique" with 3 ml of PF-NaCl (0.9% NSS). Correct needle placement was confirmed in the antero-posterior and lateral fluoroscopic views. The lead was gently introduced and manipulated under real-time fluoroscopy, constantly assessing for pain, discomfort, or paresthesias, until the tip rested at the desired level. Both sides were done in identical fashion. Electrode placement was tested until appropriate coverage was attained. Once the patient confirmed that the stimulation was over the desired area, the lead(s) was/were secured in place and the introducer needles removed. This was done under real-time fluoroscopy while observing the electrode tip to avoid unintended migration. The area was covered with a non-occlusive dressing and the patient transported to recovery for further programming.  Vitals:   01/24/21 1040 01/24/21 1050 01/24/21 1100 01/24/21 1110  BP: (!) 154/66 (!) 161/67 (!) 156/66 (!) 154/62  Resp: 16 14 15 16   Temp:      TempSrc:      SpO2: 96% 97% 98% 99%  Weight:      Height:       Start Time: 0934 hrs. End Time: 1032 hrs.  Neurostimulator Details:   Lead(s):  Brand: [employer]tronic         Epidural Access Level:  T12-L1 T12-L1  Lead implant:  Bilateral   No. of Electrodes/Lead:  8           Laterality:   Right Left  Top electrode location:  T8 (superior) T8 (bottom)  Model No.: Same  Length: 60cm Same  Lot No.: [id number]  MRI compatibility:  Yes Yes   Imaging Guidance (Spinal):          Type of Imaging Technique: Fluoroscopy Guidance (Spinal) Indication(s): Assistance in needle guidance and placement for procedures requiring needle placement in or near specific anatomical locations not easily accessible without such assistance. Exposure Time: Please see nurses notes. Contrast: None used. Fluoroscopic Guidance: I was personally present during the use of fluoroscopy. "Tunnel Vision Technique" used to obtain the best possible view of the target area. Parallax error corrected before commencing the procedure. "Direction-depth-direction" technique used to introduce the needle under continuous pulsed fluoroscopy. Once target was reached, antero-posterior, oblique, and lateral fluoroscopic projection used confirm needle placement in all planes. Images permanently stored in EMR. Interpretation: No contrast injected. I personally interpreted the imaging intraoperatively. Adequate needle placement confirmed in multiple planes. Permanent images saved into the patient's record.      Antibiotic Prophylaxis:   Anti-infectives (From admission, onward)    Start     Dose/Rate Route Frequency Ordered Stop   01/24/21 0845  ceFAZolin (ANCEF) IVPB 2g/100 mL premix        2 g 200 mL/hr over 30 Minutes Intravenous  Once 01/24/21 0833 01/24/21 0847   01/24/21 0000  cephALEXin (KEFLEX) 500 MG capsule        500 mg Oral 4 times daily 01/24/21 0826 01/31/21 2359      Indication(s):  SCS trial  Post-operative Assessment:  Post-procedure Vital Signs:  Pulse/HCG Rate:  (!) 56 Temp: (!) 97.2 F (36.2 C) Resp: 16 BP: (!) 154/62 SpO2: 99 %  Complications: No immediate post-treatment complications observed by team, or reported by patient.  Note: The patient tolerated the entire  procedure well. A repeat set of vitals were taken after the procedure and the patient was kept under observation following institutional policy, for this type of procedure. Post-procedural  neurological assessment was performed, showing return to baseline, prior to discharge. The patient was provided with post-procedure discharge instructions, including a section on how to identify potential problems. Should any problems arise concerning this procedure, the patient was given instructions to immediately contact us, at any time, without hesitation. In any case, we plan to contact the patient by telephone for a follow-up status report regarding this interventional procedure.  Comments:  No additional relevant information.  Plan of Care  Orders:  Orders Placed This Encounter  Procedures   DG PAIN CLINIC C-ARM 1-60 MIN NO REPORT    Intraoperative interpretation by procedural physician at [facility].    Standing Status:   Standing    Number of Occurrences:   1    Order Specific Question:   Reason for exam:    Answer:   Assistance in needle guidance and placement for procedures requiring needle placement in or near specific anatomical locations not easily accessible without such assistance.   Chronic Opioid Analgesic:  Percocet 10 mg 4 times daily as needed, quantity 120/month MME equals 60   Medications administered: We administered lidocaine, lactated ringers, midazolam, ceFAZolin, ropivacaine (PF) 2 mg/mL (0.2%), and fentaNYL.  See the medical record for exact dosing, route, and time of administration.  Follow-up plan:   Return in about 1 week (around 01/31/2021) for SCS lead pull 10 am.     Recent Visits Date Type Provider Dept  12/26/20 Office Visit Edward Jolly, MD [facility]  Showing recent visits within past 90 days and meeting all other requirements Today's Visits Date Type Provider Dept  01/24/21 Procedure visit Edward Jolly, MD [facility]  Showing  today's visits and meeting all other requirements Future Appointments Date Type Provider Dept  01/31/21 Appointment Edward Jolly, MD [facility]  04/24/21 Appointment Edward Jolly, MD [facility]  Showing future appointments within next 90 days and meeting all other requirements Disposition: Discharge home  Discharge (Date  Time): 01/24/2021; 1113 hrs.   Primary Care Physician: Babs Sciara, MD Location: [facility] Note by: Edward Jolly, MD Date: 01/24/2021; Time: 1:19 PM

## 2021-01-25 ENCOUNTER — Telehealth: Payer: Self-pay | Admitting: *Deleted

## 2021-01-25 NOTE — Telephone Encounter (Signed)
Attempted to call for post procedure follow-up. Message left. 

## 2021-01-31 ENCOUNTER — Ambulatory Visit (HOSPITAL_BASED_OUTPATIENT_CLINIC_OR_DEPARTMENT_OTHER): Payer: PPO | Admitting: Student in an Organized Health Care Education/Training Program

## 2021-01-31 ENCOUNTER — Ambulatory Visit
Admission: RE | Admit: 2021-01-31 | Discharge: 2021-01-31 | Disposition: A | Discharge status: Home / Self Care | Payer: PPO | Source: Ambulatory Visit | Attending: Student in an Organized Health Care Education/Training Program | Admitting: Student in an Organized Health Care Education/Training Program

## 2021-01-31 ENCOUNTER — Other Ambulatory Visit: Payer: Self-pay

## 2021-01-31 ENCOUNTER — Encounter: Payer: Self-pay | Admitting: Student in an Organized Health Care Education/Training Program

## 2021-01-31 VITALS — BP 146/69 | HR 68

## 2021-01-31 DIAGNOSIS — M5416 Radiculopathy, lumbar region: Secondary | ICD-10-CM | POA: Diagnosis not present

## 2021-01-31 DIAGNOSIS — G894 Chronic pain syndrome: Secondary | ICD-10-CM | POA: Insufficient documentation

## 2021-01-31 DIAGNOSIS — M549 Dorsalgia, unspecified: Secondary | ICD-10-CM | POA: Insufficient documentation

## 2021-01-31 DIAGNOSIS — M48062 Spinal stenosis, lumbar region with neurogenic claudication: Secondary | ICD-10-CM | POA: Diagnosis not present

## 2021-01-31 DIAGNOSIS — M9712XA Periprosthetic fracture around internal prosthetic left knee joint, initial encounter: Secondary | ICD-10-CM | POA: Insufficient documentation

## 2021-01-31 DIAGNOSIS — M961 Postlaminectomy syndrome, not elsewhere classified: Secondary | ICD-10-CM | POA: Insufficient documentation

## 2021-01-31 DIAGNOSIS — M797 Fibromyalgia: Secondary | ICD-10-CM | POA: Diagnosis not present

## 2021-01-31 DIAGNOSIS — M79671 Pain in right foot: Secondary | ICD-10-CM | POA: Insufficient documentation

## 2021-01-31 DIAGNOSIS — Z79891 Long term (current) use of opiate analgesic: Secondary | ICD-10-CM | POA: Diagnosis not present

## 2021-01-31 DIAGNOSIS — Z6837 Body mass index (BMI) 37.0-37.9, adult: Secondary | ICD-10-CM | POA: Insufficient documentation

## 2021-01-31 DIAGNOSIS — M47816 Spondylosis without myelopathy or radiculopathy, lumbar region: Secondary | ICD-10-CM | POA: Diagnosis not present

## 2021-01-31 DIAGNOSIS — Z9889 Other specified postprocedural states: Secondary | ICD-10-CM | POA: Diagnosis not present

## 2021-01-31 DIAGNOSIS — M5136 Other intervertebral disc degeneration, lumbar region: Secondary | ICD-10-CM | POA: Diagnosis not present

## 2021-01-31 DIAGNOSIS — Z9682 Presence of neurostimulator: Secondary | ICD-10-CM | POA: Insufficient documentation

## 2021-01-31 DIAGNOSIS — G8929 Other chronic pain: Secondary | ICD-10-CM

## 2021-01-31 NOTE — Progress Notes (Signed)
1022 SCS lead pull per Dr. Cherylann Ratel and [employer] rep. Sit clear and  both leads intact. Tolerated well.

## 2021-01-31 NOTE — Progress Notes (Signed)
Safety precautions to be maintained throughout the outpatient stay will include: orient to surroundings, keep bed in low position, maintain call bell within reach at all times, provide assistance with transfer out of bed and ambulation.  

## 2021-01-31 NOTE — Progress Notes (Signed)
PROVIDER NOTE: Information contained herein reflects review and annotations entered in association with encounter. Interpretation of such information and data should be left to medically-trained personnel. Information provided to patient can be located elsewhere in the medical record under "Patient Instructions". Document created using STT-dictation technology, any transcriptional errors that may result from process are unintentional.    Patient: Lauren Greene  Service Category: E/M  Provider: Edward Jolly, MD  DOB: 1965-09-11  DOS: 01/31/2021  Specialty: Interventional Pain Management  MRN: [id number]  Setting: Ambulatory outpatient  PCP: Babs Sciara, MD  Type: Established Patient    Referring Provider: Babs Sciara, MD  Location: Office  Delivery: Face-to-face     HPI  Lauren Greene, a 55 y.o. year old female, is here today because of her Failed back surgical syndrome [M96.1]. Lauren Greene primary complain today is Pain (SCS lead pull) Last encounter: My last encounter with her was on 01/24/2021. Pertinent problems: [name] has Periprosthetic fracture around internal prosthetic left knee joint; Chronic back pain; Morbid obesity (HCC); Lumbar degenerative disc disease; Chronic, continuous use of opioids; Fibromyalgia; Lumbar spondylosis; Chronic pain syndrome; and Encounter for long-term opiate analgesic use on their pertinent problem list. Pain Assessment: Severity of Chronic pain is reported as a (P) 2 /10. Location: Back Lower/Denies and also has right foot pain. Onset: More than a month ago. Quality: Constant. Timing: Constant. Modifying factor(s): Pain medicine. Vitals:  height is 5\' 8"  (1.727 m) (pended) and weight is 245 lb (111.1 kg) (pended). Her temporal temperature is 97.2 F (36.2 C) (abnormal, pended). Her blood pressure is 146/69 (abnormal) and her pulse is 68. Her respiration is 16 (pended) and oxygen saturation is 95%.   Reason for encounter: post-procedure  assessment.   Patient presents today for removal of her Medtronic spinal cord stimulator percutaneous trial leads.  She states that she had a positive spinal cord stimulator trial.  She endorses approximately 75% pain relief in regards to her low back pain.  She has a history of prior lumbar spine surgery.  Unfortunately it did not impact her right foot pain which is more arthropathic.  She would like to move forward with permanent percutaneous implant.  I will place a referral order for Dr. for Medtronic percutaneous spinal cord stimulator implant.  Pharmacotherapy Assessment  Analgesic: Percocet 10 mg 4 times daily as needed, quantity 120/month MME equals 60   Monitoring: [place] PMP: PDMP reviewed during this encounter.       Pharmacotherapy: No side-effects or adverse reactions reported. Compliance: No problems identified. Effectiveness: Clinically acceptable.  Adriana Simas, RN  01/31/2021 10:24 AM  Sign when Signing Visit 1022 SCS lead pull per Dr. 02/02/2021 and [employer] rep. Sit clear and  both leads intact. Tolerated well.  Cherylann Ratel, RN  01/31/2021 10:22 AM  Sign when Signing Visit Safety precautions to be maintained throughout the outpatient stay will include: orient to surroundings, keep bed in low position, maintain call bell within reach at all times, provide assistance with transfer out of bed and ambulation.     UDS:  Summary  Date Value Ref Range Status  06/08/2020 Note  Final    Comment:    ==================================================================== [facility] (MW) ==================================================================== Test                             Result       Flag       Units  Drug Present and Declared for Prescription Verification   Amphetamine                    >3953        EXPECTED   ng/mg creat    Amphetamine is available as a schedule II prescription drug.    Oxycodone                      >3953        EXPECTED    ng/mg creat   Oxymorphone                    1119         EXPECTED   ng/mg creat   Noroxycodone                   >3953        EXPECTED   ng/mg creat   Noroxymorphone                 310          EXPECTED   ng/mg creat    Sources of oxycodone are scheduled prescription medications.    Oxymorphone, noroxycodone, and noroxymorphone are expected    metabolites of oxycodone. Oxymorphone is also available as a    scheduled prescription medication.  ==================================================================== Test                      Result    Flag   Units      Ref Range   Creatinine              253              mg/dL      >=20 ==================================================================== Declared Medications:  The flagging and interpretation on this report are based on the  following declared medications.  Unexpected results may arise from  inaccuracies in the declared medications.   **Note: The testing scope of this panel includes these medications:   Amphetamine (Adderall)  Oxycodone (Percocet)   **Note: The testing scope of this panel does not include the  following reported medications:   Acetaminophen (Percocet)  Amlodipine (Norvasc)  Diclofenac  Duloxetine (Cymbalta)  Lidocaine (Xylocaine)  Methocarbamol (Robaxin)  Nystatin (Mycostatin)  Omeprazole (Prilosec)  Oxybutynin  Trazodone (Desyrel) ==================================================================== For clinical consultation, please call [phone]. ====================================================================      ROS  Constitutional: Denies any fever or chills Gastrointestinal: No reported hemesis, hematochezia, vomiting, or acute GI distress Musculoskeletal:  Improved low back pain Neurological: No reported episodes of acute onset apraxia, aphasia, dysarthria, agnosia, amnesia, paralysis, loss of coordination, or loss of consciousness  Medication Review  Adalimumab,  DULoxetine, Diclofenac Sodium CR, Oxybutynin Chloride, amLODipine, amphetamine-dextroamphetamine, cephALEXin, lidocaine, methocarbamol, nystatin, omeprazole, oxyCODONE-acetaminophen, and traZODone  History Review  Allergy: Ms. Sitzer is allergic to wellbutrin [bupropion] and lyrica [pregabalin]. Drug: Ms. Talerico  reports no history of drug use. Alcohol:  reports that she does not currently use alcohol. Tobacco:  reports that she quit smoking about 20 years ago. Her smoking use included cigarettes. She has never used smokeless tobacco. Social: Ms. Fuller  reports that she quit smoking about 20 years ago. Her smoking use included cigarettes. She has never used smokeless tobacco. She reports that she does not currently use alcohol. She reports that she does not use drugs. Medical:  has a past medical history of ADHD (attention deficit hyperactivity disorder), Ankylosing spondylitis ([place]),  Arthritis, Back pain, Broken femur ([place]) (08/27/2012), Chronic pain, GERD (gastroesophageal reflux disease), Migraine, Osteoarthritis, Raynaud disease, and Raynaud phenomenon (04/06/2018). Surgical: Ms. Theilen  has a past surgical history that includes Total knee arthroplasty; Heel spur surgery; Back surgery; IM nailing femoral shaft fracture (Left, 08/27/2012); Femur IM nail (Left, 08/27/2012); Cervical biopsy w/ loop electrode excision (1990's); Endometrial biopsy (04-05-05 and 02-12-06); Tubal ligation; Shoulder surgery (Left); Vaginal hysterectomy; Foot arthrodesis (Right, 02/01/2020); and Achilles tendon lengthening (Right, 02/01/2020). Family: family history includes Bipolar disorder in her mother and sister; Breast cancer in her mother; Cancer in her father, mother, and another family member; Heart failure in her father and another family member.  Laboratory Chemistry Profile   Renal Lab Results  Component Value Date   BUN 18 11/09/2020   CREATININE 0.79 11/09/2020   BCR 23 11/09/2020   GFRAA 115  08/30/2019   GFRNONAA 100 08/30/2019    Hepatic Lab Results  Component Value Date   AST 18 09/13/2020   ALT 21 09/13/2020   ALBUMIN 4.4 09/13/2020   ALKPHOS 123 (H) 09/13/2020   LIPASE 25 01/12/2016    Electrolytes Lab Results  Component Value Date   NA 147 (H) 11/09/2020   K 4.9 11/09/2020   CL 108 (H) 11/09/2020   CALCIUM 8.9 11/09/2020    Bone Lab Results  Component Value Date   VD25OH 31 01/07/2013    Inflammation (CRP: Acute Phase) (ESR: Chronic Phase) Lab Results  Component Value Date   CRP 2 09/13/2020   ESRSEDRATE 4 04/06/2018         Note: Above Lab results reviewed.   Physical Exam  General appearance: Well nourished, well developed, and well hydrated. In no apparent acute distress Mental status: Alert, oriented x 3 (person, place, & time)       Respiratory: No evidence of acute respiratory distress Eyes: PERLA Vitals: BP (!) 146/69 (BP Location: Right Arm, Patient Position: Sitting, Cuff Size: Large)   Pulse 68   Temp (!) (P) 97.2 F (36.2 C) (Temporal)   Resp (P) 16   Ht (P) $Remo'5\' 8"'izCiw$  (1.727 m)   Wt (P) 245 lb (111.1 kg)   LMP 03/18/2004   SpO2 95%   BMI (P) 37.25 kg/m  BMI: Estimated body mass index is 37.25 kg/m (pended) as calculated from the following:   Height as of this encounter: (P) $RemoveBefore'5\' 8"'lVWgwUBkfoGeS$  (1.727 m).   Weight as of this encounter: (P) 245 lb (111.1 kg). Ideal: Ideal body weight: 63.9 kg (140 lb 14 oz) Adjusted ideal body weight: 82.8 kg (182 lb 8.4 oz)  Improved low back pain during SCS trial SCS trial leads removed under live fluoroscopy.  Tips intact.  Assessment   Status Diagnosis  Controlled Controlled Controlled 1. Failed back surgical syndrome   2. S/P lumbar microdiscectomy (left L4/5 with micordissection)   3. Spinal stenosis, lumbar region, with neurogenic claudication   4. Chronic radicular lumbar pain   5. Chronic pain syndrome        Plan of Care   Referral to Dr. Lacinda Axon for permanent implant with  [employer].  Orders:  Orders Placed This Encounter  Procedures   DG PAIN CLINIC C-ARM 1-60 MIN NO REPORT    Intraoperative interpretation by procedural physician at [place].    Standing Status:   Standing    Number of Occurrences:   1    Order Specific Question:   Reason for exam:    Answer:   Assistance in needle guidance and  placement for procedures requiring needle placement in or near specific anatomical locations not easily accessible without such assistance.   Ambulatory referral to Neurosurgery    Referral Priority:   Routine    Referral Type:   Surgical    Referral Reason:   Specialty Services Required    Referred to Provider:   Deetta Perla, MD    Number of Visits Requested:   1    Follow-up plan:   Return for Keep sch. appt.    Recent Visits Date Type Provider Dept  01/24/21 Procedure visit Gillis Santa, MD [facility]  12/26/20 Office Visit Gillis Santa, MD [facility]  Showing recent visits within past 90 days and meeting all other requirements Today's Visits Date Type Provider Dept  01/31/21 Procedure visit Gillis Santa, MD [facility]  Showing today's visits and meeting all other requirements Future Appointments Date Type Provider Dept  04/24/21 Appointment Gillis Santa, MD [facility]  Showing future appointments within next 90 days and meeting all other requirements I discussed the assessment and treatment plan with the patient. The patient was provided an opportunity to ask questions and all were answered. The patient agreed with the plan and demonstrated an understanding of the instructions.  Patient advised to call back or seek an in-person evaluation if the symptoms or condition worsens.   Note by: Gillis Santa, MD Date: 01/31/2021; Time: 10:34 AM

## 2021-02-01 ENCOUNTER — Telehealth: Payer: Self-pay | Admitting: *Deleted

## 2021-02-20 ENCOUNTER — Other Ambulatory Visit: Payer: Self-pay | Admitting: Neurosurgery

## 2021-02-20 DIAGNOSIS — G894 Chronic pain syndrome: Secondary | ICD-10-CM | POA: Diagnosis not present

## 2021-02-22 ENCOUNTER — Other Ambulatory Visit: Payer: Self-pay | Admitting: Family Medicine

## 2021-02-23 ENCOUNTER — Encounter
Admission: RE | Admit: 2021-02-23 | Discharge: 2021-02-23 | Disposition: A | Discharge status: Home / Self Care | Payer: PPO | Source: Ambulatory Visit | Attending: Neurosurgery | Admitting: Neurosurgery

## 2021-02-23 ENCOUNTER — Other Ambulatory Visit: Payer: Self-pay

## 2021-02-23 HISTORY — DX: Essential (primary) hypertension: I10

## 2021-02-23 HISTORY — DX: Ventricular premature depolarization: I49.3

## 2021-02-23 HISTORY — DX: Cardiac murmur, unspecified: R01.1

## 2021-02-23 NOTE — Pre-Procedure Instructions (Signed)
Pt called PAT today and spoke to Katie to inform her that she has her 56 year old grandchild at home and cannot come in for her in person anesthesia interview today for her upcoming procedure with Dr Adriana Simas on 02-26-21.  I called pt at home and did her anesthesia phone interview but I explained to her that Dr Adriana Simas has ordered labs and urine and anesthesia needs an EKG and since she will not come in today I explained to her that if anything is abnormal the day of surgery there is the potential that she could be cancelled. Pt verbalized understanding

## 2021-02-23 NOTE — Patient Instructions (Signed)
Your procedure is scheduled on:02-26-21 Monday Report to the Registration Desk on the 1st floor of the [facility].Then proceed to the 2nd floor Surgery Desk in the Medical [facility] To find out your arrival time, please call [phone] between 1PM - 3PM on:02-23-21 Friday  REMEMBER: Instructions that are not followed completely may result in serious medical risk, up to and including death; or upon the discretion of your surgeon and anesthesiologist your surgery may need to be rescheduled.  Do not eat food after midnight the night before surgery.  No gum chewing, lozengers or hard candies.  You may however, drink CLEAR liquids up to 2 hours before you are scheduled to arrive for your surgery. Do not drink anything within 2 hours of your scheduled arrival time.  Clear liquids include: - water  - apple juice without pulp - gatorade (not RED, PURPLE, OR BLUE) - black coffee or tea (Do NOT add milk or creamers to the coffee or tea) Do NOT drink anything that is not on this list.  TAKE THESE MEDICATIONS THE MORNING OF SURGERY WITH A SIP OF WATER: -amLODipine (NORVASC) 2.5 MG tablet -DULoxetine (CYMBALTA) 60 MG capsule -oxyCODONE-acetaminophen (PERCOCET) 10-325 MG tablet -omeprazole (PRILOSEC) 40 MG capsule-take one the night before and one on the morning of surgery - helps to prevent nausea after surgery.)  One week prior to surgery: Stop Anti-inflammatories (NSAIDS) NOW (02-23-21) such as Diclofenac Sodium CR 100 MG 24 hr tablet, Advil, Aleve, Ibuprofen, Motrin, Naproxen, Naprosyn and Aspirin based products such as Excedrin, [place] Powder, BC Powder.You may however, take Tylenol/Percocet if needed for pain up until the day of surgery.  Stop ANY OVER THE COUNTER supplements/vitamins NOW (02-23-21) until after surgery.  No Alcohol for 24 hours before or after surgery.  No Smoking including e-cigarettes for 24 hours prior to surgery.  No chewable tobacco products for at least 6 hours prior to  surgery.  No nicotine patches on the day of surgery.  Do not use any "recreational" drugs for at least a week prior to your surgery.  Please be advised that the combination of cocaine and anesthesia may have negative outcomes, up to and including death. If you test positive for cocaine, your surgery will be cancelled.  On the morning of surgery brush your teeth with toothpaste and water, you may rinse your mouth with mouthwash if you wish. Do not swallow any toothpaste or mouthwash.  Do not wear jewelry, make-up, hairpins, clips or nail polish.  Do not wear lotions, powders, or perfumes.   Do not shave body from the neck down 48 hours prior to surgery just in case you cut yourself which could leave a site for infection.  Also, freshly shaved skin may become irritated if using the CHG soap.  Contact lenses, hearing aids and dentures may not be worn into surgery.  Do not bring valuables to the hospital. [facility] is not responsible for any missing/lost belongings or valuables.   Notify your doctor if there is any change in your medical condition (cold, fever, infection).  Wear comfortable clothing (specific to your surgery type) to the hospital.  After surgery, you can help prevent lung complications by doing breathing exercises.  Take deep breaths and cough every 1-2 hours. Your doctor may order a device called an Incentive Spirometer to help you take deep breaths. When coughing or sneezing, hold a pillow firmly against your incision with both hands. This is called "splinting." Doing this helps protect your incision. It also decreases belly  discomfort.  If you are being admitted to the hospital overnight, leave your suitcase in the car. After surgery it may be brought to your room.  If you are being discharged the day of surgery, you will not be allowed to drive home. You will need a responsible adult (18 years or older) to drive you home and stay with you that night.   If you are  taking public transportation, you will need to have a responsible adult (18 years or older) with you. Please confirm with your physician that it is acceptable to use public transportation.   Please call the Pre-admissions Testing Dept. at [phone] if you have any questions about these instructions.  Surgery Visitation Policy:  Patients undergoing a surgery or procedure may have one family member or support person with them as long as that person is not COVID-19 positive or experiencing its symptoms.  That person may remain in the waiting area during the procedure and may rotate out with other people.  Inpatient Visitation:    Visiting hours are 7 a.m. to 8 p.m. Up to two visitors ages 16+ are allowed at one time in a patient room. The visitors may rotate out with other people during the day. Visitors must check out when they leave, or other visitors will not be allowed. One designated support person may remain overnight. The visitor must pass COVID-19 screenings, use hand sanitizer when entering and exiting the patient's room and wear a mask at all times, including in the patient's room. Patients must also wear a mask when staff or their visitor are in the room. Masking is required regardless of vaccination status.

## 2021-02-25 MED ORDER — CHLORHEXIDINE GLUCONATE 0.12 % MT SOLN: 15.0000 mL | Freq: Once | OROMUCOSAL | Status: AC

## 2021-02-25 MED ORDER — ORAL CARE MOUTH RINSE: 15.0000 mL | Freq: Once | OROMUCOSAL | Status: AC

## 2021-02-25 MED ORDER — LACTATED RINGERS IV SOLN: INTRAVENOUS | Status: DC

## 2021-02-26 ENCOUNTER — Ambulatory Visit: Payer: PPO | Admitting: Urgent Care

## 2021-02-26 ENCOUNTER — Ambulatory Visit: Payer: PPO | Admitting: Anesthesiology

## 2021-02-26 ENCOUNTER — Other Ambulatory Visit: Payer: Self-pay

## 2021-02-26 ENCOUNTER — Encounter: Payer: Self-pay | Admitting: Neurosurgery

## 2021-02-26 ENCOUNTER — Ambulatory Visit: Payer: PPO

## 2021-02-26 ENCOUNTER — Encounter
Admission: RE | Disposition: A | Discharge status: Home / Self Care | Payer: Self-pay | Source: Home / Self Care | Attending: Neurosurgery

## 2021-02-26 ENCOUNTER — Ambulatory Visit
Admission: RE | Admit: 2021-02-26 | Discharge: 2021-02-26 | Disposition: A | Discharge status: Home / Self Care | Payer: PPO | Attending: Neurosurgery | Admitting: Neurosurgery

## 2021-02-26 DIAGNOSIS — Z419 Encounter for procedure for purposes other than remedying health state, unspecified: Secondary | ICD-10-CM

## 2021-02-26 DIAGNOSIS — I498 Other specified cardiac arrhythmias: Secondary | ICD-10-CM | POA: Diagnosis not present

## 2021-02-26 DIAGNOSIS — I493 Ventricular premature depolarization: Secondary | ICD-10-CM | POA: Diagnosis not present

## 2021-02-26 DIAGNOSIS — M48061 Spinal stenosis, lumbar region without neurogenic claudication: Secondary | ICD-10-CM | POA: Insufficient documentation

## 2021-02-26 DIAGNOSIS — M419 Scoliosis, unspecified: Secondary | ICD-10-CM | POA: Diagnosis not present

## 2021-02-26 DIAGNOSIS — M1990 Unspecified osteoarthritis, unspecified site: Secondary | ICD-10-CM | POA: Insufficient documentation

## 2021-02-26 DIAGNOSIS — E669 Obesity, unspecified: Secondary | ICD-10-CM | POA: Diagnosis not present

## 2021-02-26 DIAGNOSIS — Z9889 Other specified postprocedural states: Secondary | ICD-10-CM | POA: Diagnosis not present

## 2021-02-26 DIAGNOSIS — G894 Chronic pain syndrome: Secondary | ICD-10-CM | POA: Insufficient documentation

## 2021-02-26 DIAGNOSIS — Z01812 Encounter for preprocedural laboratory examination: Secondary | ICD-10-CM | POA: Diagnosis not present

## 2021-02-26 DIAGNOSIS — R011 Cardiac murmur, unspecified: Secondary | ICD-10-CM | POA: Diagnosis not present

## 2021-02-26 DIAGNOSIS — Z6837 Body mass index (BMI) 37.0-37.9, adult: Secondary | ICD-10-CM | POA: Diagnosis not present

## 2021-02-26 DIAGNOSIS — Z87891 Personal history of nicotine dependence: Secondary | ICD-10-CM | POA: Insufficient documentation

## 2021-02-26 DIAGNOSIS — I7300 Raynaud's syndrome without gangrene: Secondary | ICD-10-CM | POA: Diagnosis not present

## 2021-02-26 DIAGNOSIS — E7849 Other hyperlipidemia: Secondary | ICD-10-CM | POA: Diagnosis not present

## 2021-02-26 DIAGNOSIS — I10 Essential (primary) hypertension: Secondary | ICD-10-CM | POA: Diagnosis not present

## 2021-02-26 HISTORY — PX: SPINAL CORD STIMULATOR INSERTION: SHX5378

## 2021-02-26 LAB — BASIC METABOLIC PANEL
Anion gap: 6 (ref 5–15)
BUN: 18 mg/dL (ref 6–20)
CO2: 24 mmol/L (ref 22–32)
Calcium: 8.6 mg/dL — ABNORMAL LOW (ref 8.9–10.3)
Chloride: 107 mmol/L (ref 98–111)
Creatinine, Ser: 0.53 mg/dL (ref 0.44–1.00)
GFR, Estimated: >60 mL/min (ref >60)
Glucose, Bld: 92 mg/dL (ref 70–99)
Potassium: 3.9 mmol/L (ref 3.5–5.1)
Sodium: 137 mmol/L (ref 135–145)

## 2021-02-26 LAB — TYPE AND SCREEN
ABO/RH(D): B POS
Antibody Screen: NEGATIVE

## 2021-02-26 LAB — URINALYSIS, ROUTINE W REFLEX MICROSCOPIC
Bilirubin Urine: NEGATIVE
Glucose, UA: NEGATIVE mg/dL
Hgb urine dipstick: NEGATIVE
Ketones, ur: NEGATIVE mg/dL
Nitrite: NEGATIVE
Protein, ur: NEGATIVE mg/dL
Specific Gravity, Urine: 1.025 (ref 1.005–1.030)
pH: 5 (ref 5.0–8.0)

## 2021-02-26 LAB — PROTIME-INR
INR: 1 (ref 0.8–1.2)
Prothrombin Time: 13.5 seconds (ref 11.4–15.2)

## 2021-02-26 LAB — APTT: aPTT: 32 seconds (ref 24–36)

## 2021-02-26 LAB — CBC
HCT: 37.1 % (ref 36.0–46.0)
Hemoglobin: 12.1 g/dL (ref 12.0–15.0)
MCH: 27.2 pg (ref 26.0–34.0)
MCHC: 32.6 g/dL (ref 30.0–36.0)
MCV: 83.4 fL (ref 80.0–100.0)
Platelets: 217 10*3/uL (ref 150–400)
RBC: 4.45 MIL/uL (ref 3.87–5.11)
RDW: 13.2 % (ref 11.5–15.5)
WBC: 5 10*3/uL (ref 4.0–10.5)
nRBC: 0 % (ref 0.0–0.2)

## 2021-02-26 LAB — SURGICAL PCR SCREEN
MRSA, PCR: NEGATIVE
Staphylococcus aureus: NEGATIVE

## 2021-02-26 SURGERY — INSERTION, SPINAL CORD STIMULATOR, LUMBAR
Anesthesia: General | Site: Back

## 2021-02-26 MED ORDER — GLYCOPYRROLATE 0.2 MG/ML IJ SOLN
INTRAMUSCULAR | Status: AC
  Filled 2021-02-26: qty 2

## 2021-02-26 MED ORDER — EPHEDRINE SULFATE 50 MG/ML IJ SOLN
INTRAMUSCULAR | Status: DC | PRN
  Administered 2021-02-26: 5 mg via INTRAVENOUS
  Administered 2021-02-26 (×3): 10 mg via INTRAVENOUS
  Administered 2021-02-26: 15 mg via INTRAVENOUS

## 2021-02-26 MED ORDER — LIDOCAINE HCL (PF) 2 % IJ SOLN
INTRAMUSCULAR | Status: AC
  Filled 2021-02-26: qty 5

## 2021-02-26 MED ORDER — FENTANYL CITRATE (PF) 100 MCG/2ML IJ SOLN
INTRAMUSCULAR | Status: AC
  Filled 2021-02-26: qty 2

## 2021-02-26 MED ORDER — CHLORHEXIDINE GLUCONATE 0.12 % MT SOLN
OROMUCOSAL | Status: AC
  Administered 2021-02-26: 15 mL via OROMUCOSAL
  Filled 2021-02-26: qty 15

## 2021-02-26 MED ORDER — ONDANSETRON HCL 4 MG/2ML IJ SOLN
INTRAMUSCULAR | Status: AC
  Filled 2021-02-26: qty 2

## 2021-02-26 MED ORDER — ONDANSETRON HCL 4 MG/2ML IJ SOLN
INTRAMUSCULAR | Status: DC | PRN
  Administered 2021-02-26: 4 mg via INTRAVENOUS

## 2021-02-26 MED ORDER — 0.9 % SODIUM CHLORIDE (POUR BTL) OPTIME
TOPICAL | Status: DC | PRN
  Administered 2021-02-26: 1000 mL

## 2021-02-26 MED ORDER — BUPIVACAINE-EPINEPHRINE (PF) 0.5% -1:200000 IJ SOLN
INTRAMUSCULAR | Status: DC | PRN
  Administered 2021-02-26: 12 mL

## 2021-02-26 MED ORDER — VASOPRESSIN 20 UNIT/ML IV SOLN
INTRAVENOUS | Status: DC | PRN
  Administered 2021-02-26 (×4): 1 [IU] via INTRAVENOUS

## 2021-02-26 MED ORDER — FENTANYL CITRATE (PF) 100 MCG/2ML IJ SOLN
25.0000 ug | INTRAMUSCULAR | Status: DC | PRN
  Administered 2021-02-26: 50 ug via INTRAVENOUS

## 2021-02-26 MED ORDER — FENTANYL CITRATE (PF) 100 MCG/2ML IJ SOLN
INTRAMUSCULAR | Status: DC | PRN
  Administered 2021-02-26 (×2): 50 ug via INTRAVENOUS

## 2021-02-26 MED ORDER — REMIFENTANIL HCL 1 MG IV SOLR
INTRAVENOUS | Status: DC | PRN
  Administered 2021-02-26: .05 ug/kg/min via INTRAVENOUS

## 2021-02-26 MED ORDER — CEFAZOLIN SODIUM-DEXTROSE 2-4 GM/100ML-% IV SOLN
INTRAVENOUS | Status: AC
  Filled 2021-02-26: qty 100

## 2021-02-26 MED ORDER — PHENYLEPHRINE HCL-NACL 20-0.9 MG/250ML-% IV SOLN
INTRAVENOUS | Status: DC | PRN
  Administered 2021-02-26: 50 ug/min via INTRAVENOUS

## 2021-02-26 MED ORDER — OXYCODONE HCL 5 MG/5ML PO SOLN: 5.0000 mg | Freq: Once | ORAL | Status: DC | PRN

## 2021-02-26 MED ORDER — DEXAMETHASONE SODIUM PHOSPHATE 10 MG/ML IJ SOLN
INTRAMUSCULAR | Status: AC
  Filled 2021-02-26: qty 1

## 2021-02-26 MED ORDER — GLYCOPYRROLATE 0.2 MG/ML IJ SOLN
INTRAMUSCULAR | Status: DC | PRN
  Administered 2021-02-26: .2 mg via INTRAVENOUS

## 2021-02-26 MED ORDER — PHENYLEPHRINE HCL (PRESSORS) 10 MG/ML IV SOLN
INTRAVENOUS | Status: DC | PRN
  Administered 2021-02-26 (×5): 160 ug via INTRAVENOUS

## 2021-02-26 MED ORDER — CEFAZOLIN SODIUM-DEXTROSE 2-4 GM/100ML-% IV SOLN
2.0000 g | INTRAVENOUS | Status: AC
  Administered 2021-02-26: 2 g via INTRAVENOUS

## 2021-02-26 MED ORDER — KETOROLAC TROMETHAMINE 30 MG/ML IJ SOLN
INTRAMUSCULAR | Status: DC | PRN
  Administered 2021-02-26: 15 mg via INTRAVENOUS

## 2021-02-26 MED ORDER — VASOPRESSIN 20 UNIT/ML IV SOLN
INTRAVENOUS | Status: AC
  Filled 2021-02-26: qty 1

## 2021-02-26 MED ORDER — OXYCODONE HCL 5 MG PO TABS: 5.0000 mg | ORAL_TABLET | Freq: Four times a day (QID) | ORAL | 0 refills | Status: AC | PRN

## 2021-02-26 MED ORDER — VANCOMYCIN HCL 1000 MG IV SOLR
INTRAVENOUS | Status: AC
  Filled 2021-02-26: qty 20

## 2021-02-26 MED ORDER — SUCCINYLCHOLINE CHLORIDE 200 MG/10ML IV SOSY
PREFILLED_SYRINGE | INTRAVENOUS | Status: AC
  Filled 2021-02-26: qty 10

## 2021-02-26 MED ORDER — EPHEDRINE 5 MG/ML INJ
INTRAVENOUS | Status: AC
  Filled 2021-02-26: qty 5

## 2021-02-26 MED ORDER — SUCCINYLCHOLINE CHLORIDE 200 MG/10ML IV SOSY
PREFILLED_SYRINGE | INTRAVENOUS | Status: DC | PRN
  Administered 2021-02-26: 120 mg via INTRAVENOUS

## 2021-02-26 MED ORDER — ACETAMINOPHEN 10 MG/ML IV SOLN: 1000.0000 mg | Freq: Once | INTRAVENOUS | Status: DC | PRN

## 2021-02-26 MED ORDER — OXYCODONE HCL 5 MG PO TABS: 10.0000 mg | ORAL_TABLET | Freq: Once | ORAL | Status: DC | PRN

## 2021-02-26 MED ORDER — BUPIVACAINE-EPINEPHRINE (PF) 0.5% -1:200000 IJ SOLN
INTRAMUSCULAR | Status: AC
  Filled 2021-02-26: qty 30

## 2021-02-26 MED ORDER — REMIFENTANIL HCL 1 MG IV SOLR
INTRAVENOUS | Status: AC
  Filled 2021-02-26: qty 1000

## 2021-02-26 MED ORDER — PHENYLEPHRINE HCL-NACL 20-0.9 MG/250ML-% IV SOLN
INTRAVENOUS | Status: AC
  Filled 2021-02-26: qty 250

## 2021-02-26 MED ORDER — LIDOCAINE HCL (CARDIAC) PF 100 MG/5ML IV SOSY
PREFILLED_SYRINGE | INTRAVENOUS | Status: DC | PRN
  Administered 2021-02-26: 100 mg via INTRAVENOUS

## 2021-02-26 MED ORDER — DEXAMETHASONE SODIUM PHOSPHATE 10 MG/ML IJ SOLN
INTRAMUSCULAR | Status: DC | PRN
  Administered 2021-02-26: 10 mg via INTRAVENOUS

## 2021-02-26 MED ORDER — VANCOMYCIN HCL 1000 MG IV SOLR
INTRAVENOUS | Status: DC | PRN
  Administered 2021-02-26: 1000 mg

## 2021-02-26 MED ORDER — PROPOFOL 10 MG/ML IV BOLUS
INTRAVENOUS | Status: DC | PRN
  Administered 2021-02-26: 170 mg via INTRAVENOUS
  Administered 2021-02-26: 30 mg via INTRAVENOUS

## 2021-02-26 MED ORDER — PROMETHAZINE HCL 25 MG/ML IJ SOLN: 6.2500 mg | INTRAMUSCULAR | Status: DC | PRN

## 2021-02-26 SURGICAL SUPPLY — 49 items
ADH SKN CLS APL DERMABOND .7 (GAUZE/BANDAGES/DRESSINGS) ×2
APL PRP STRL LF DISP 70% ISPRP (MISCELLANEOUS) ×1
CHLORAPREP W/TINT 26 (MISCELLANEOUS) ×3 IMPLANT
COUNTER NEEDLE 20/40 LG (NEEDLE) ×2 IMPLANT
COVER LIGHT HANDLE STERIS (MISCELLANEOUS) ×4 IMPLANT
DERMABOND ADVANCED (GAUZE/BANDAGES/DRESSINGS) ×2
DERMABOND ADVANCED .7 DNX12 (GAUZE/BANDAGES/DRESSINGS) IMPLANT
DEVICE IMPLANT NEUROSTIMULATOR (Neuro Prosthesis/Implant) ×1 IMPLANT
DRAPE C-ARM XRAY 36X54 (DRAPES) ×3 IMPLANT
DRAPE C-ARMOR (DRAPES) ×1 IMPLANT
DRAPE LAPAROTOMY 100X77 ABD (DRAPES) ×2 IMPLANT
DRAPE SURG 17X11 SM STRL (DRAPES) ×2 IMPLANT
DRSG OPSITE POSTOP 4X6 (GAUZE/BANDAGES/DRESSINGS) ×2 IMPLANT
DRSG TEGADERM 4X4.75 (GAUZE/BANDAGES/DRESSINGS) ×3 IMPLANT
ELECT CAUTERY BLADE TIP 2.5 (TIP) ×2
ELECTRODE CAUTERY BLDE TIP 2.5 (TIP) ×1 IMPLANT
ENVELOPE ABSORB ANTIBACTERIAL (Mesh General) ×2 IMPLANT
GAUZE 4X4 16PLY ~~LOC~~+RFID DBL (SPONGE) ×2 IMPLANT
GAUZE SPONGE 4X4 12PLY STRL (GAUZE/BANDAGES/DRESSINGS) ×2 IMPLANT
GLOVE SRG 8 PF TXTR STRL LF DI (GLOVE) ×1 IMPLANT
GLOVE SURG SYN 6.5 ES PF (GLOVE) ×4 IMPLANT
GLOVE SURG SYN 6.5 PF PI (GLOVE) ×2 IMPLANT
GLOVE SURG SYN 8.0 (GLOVE) ×2 IMPLANT
GLOVE SURG SYN 8.0 PF PI (GLOVE) ×1 IMPLANT
GLOVE SURG UNDER POLY LF SZ6.5 (GLOVE) ×4 IMPLANT
GLOVE SURG UNDER POLY LF SZ8 (GLOVE) ×2
GOWN SRG LRG LVL 4 IMPRV REINF (GOWNS) ×2 IMPLANT
GOWN STRL REIN LRG LVL4 (GOWNS) ×4
GRADUATE 1200CC STRL 31836 (MISCELLANEOUS) ×2 IMPLANT
KIT LEAD (Lead) ×2 IMPLANT
KIT TURNOVER KIT A (KITS) ×2 IMPLANT
KIT WILSON FRAME (KITS) ×1 IMPLANT
MANIFOLD NEPTUNE II (INSTRUMENTS) ×2 IMPLANT
MARKER SKIN DUAL TIP RULER LAB (MISCELLANEOUS) ×2 IMPLANT
NS IRRIG 1000ML POUR BTL (IV SOLUTION) ×2 IMPLANT
PACK LAMINECTOMY NEURO (CUSTOM PROCEDURE TRAY) ×2 IMPLANT
PAD ARMBOARD 7.5X6 YLW CONV (MISCELLANEOUS) ×2 IMPLANT
POUCH TYRX ANTIBAC NEURO MED (Mesh General) IMPLANT
RECHARGER INTELLIS (NEUROSURGERY SUPPLIES) ×1 IMPLANT
REPROGRAMMER INTELLIS (NEUROSURGERY SUPPLIES) ×1 IMPLANT
SPONGE T-LAP 4X18 ~~LOC~~+RFID (SPONGE) ×2 IMPLANT
SUT ETHILON 3-0 FS-10 30 BLK (SUTURE) ×4
SUT POLYSORB 2-0 5X18 GS-10 (SUTURE) ×7 IMPLANT
SUT SILK 2 0 SH (SUTURE) ×7 IMPLANT
SUT VIC AB 0 CT1 18XCR BRD 8 (SUTURE) ×1 IMPLANT
SUT VIC AB 0 CT1 8-18 (SUTURE) ×2
SUTURE EHLN 3-0 FS-10 30 BLK (SUTURE) ×2 IMPLANT
TOWEL OR 17X26 4PK STRL BLUE (TOWEL DISPOSABLE) ×4 IMPLANT
WATER STERILE IRR 500ML POUR (IV SOLUTION) ×1 IMPLANT

## 2021-02-26 NOTE — Discharge Summary (Signed)
Physician Discharge Summary  Patient ID: Lauren Greene MRN: [id number] DOB/AGE: 08-27-1965 55 y.o.  Admit date: 02/26/2021 Discharge date: 02/26/2021  Admission Diagnoses: chronic pain syndrome  Discharge Diagnoses:  Active Problems:   * No active hospital problems. *   Discharged Condition: good  Hospital Course:  [name] is a 55 y.o s/p placement of thoracic spinal cord stimulator. Her interoperative course was uncomplicated. She was monitored post-operative in the PACU and discharged home after ambulating, urinating, and tolerating PO intake. She was given Oxycodone as needed for pain refractory to her home Percocet.   Consults: None  Significant Diagnostic Studies: none   Treatments: surgery: as above. Please see separately dictated operative report for further details.  Discharge Exam: Blood pressure (!) 157/93, pulse 68, temperature 97.9 F (36.6 C), temperature source Oral, resp. rate 16, height 5\' 8"  (1.727 m), weight 112.9 kg, last menstrual period 03/18/2004, SpO2 100 %. CN II-XII grossly intact Good and equal strength in BLE Incisions c/d/I   Disposition: Discharge disposition: 01-Home or Self Care       Discharge Instructions     Diet - low sodium heart healthy   Complete by: As directed    Remove dressing in 48 hours   Complete by: As directed       Allergies as of 02/26/2021       Reactions   Wellbutrin [bupropion]    Anger issues   Lyrica [pregabalin] Swelling   Leg swelling        Medication List     TAKE these medications    amLODipine 2.5 MG tablet Commonly known as: NORVASC Take 1 tablet (2.5 mg total) by mouth daily. What changed: when to take this   amphetamine-dextroamphetamine 30 MG 24 hr capsule Commonly known as: ADDERALL XR TAKE 2 CAPSULES BY MOUTH EACH MORNING.   Diclofenac Sodium CR 100 MG 24 hr tablet TAKE 1 TABLET BY MOUTH TWICE DAILY. TAKE WITH FOOD What changed:  how much to take when to take  this additional instructions   DULoxetine 60 MG capsule Commonly known as: CYMBALTA TAKE 2 CAPSULES BY MOUTH DAILY. What changed:  how much to take how to take this when to take this   Humira Pen 40 MG/0.4ML Pnkt Generic drug: Adalimumab Inject 40 mg into the skin every 14 (fourteen) days.   lidocaine 5 % ointment Commonly known as: XYLOCAINE APPLY TO AFFECTED AREA TWICE DAILY NO MORE THAN 5 DAYS AT A TIME   methocarbamol 750 MG tablet Commonly known as: ROBAXIN Take 750 mg by mouth daily as needed for muscle spasms.   nystatin powder Commonly known as: MYCOSTATIN/NYSTOP May apply as needed several times per day   omeprazole 40 MG capsule Commonly known as: PRILOSEC 1 qd What changed:  how much to take when to take this   Oxybutynin Chloride 10 % Gel Apply daily to skin for overactive bladder   oxyCODONE 5 MG immediate release tablet Commonly known as: Roxicodone Take 1 tablet (5 mg total) by mouth every 6 (six) hours as needed for up to 5 days for severe pain (pain refractory to home Percocet).   oxyCODONE-acetaminophen 10-325 MG tablet Commonly known as: Percocet Take 1 tablet by mouth every 6 (six) hours as needed for pain. Must last 30 days. Start taking on: March 26, 2021 What changed:  how much to take when to take this additional instructions   traZODone 100 MG tablet Commonly known as: DESYREL 1 to 2 qhs for sleep caution drowsiness  What changed:  how much to take how to take this when to take this additional instructions        Follow-up Information     Susanne Borders, PA Follow up in 2 week(s).   Why: For incision check. Appointment should already be scheduled with the [facility]. Please call the office with any questions or concerns regarding appointment date or time. Please bring stimulator accessories to this appointment for the rep to make adjustments as needed Contact information: [address] [place]  [address] [phone]                 Signed: Susanne Borders 02/26/2021, 2:09 PM

## 2021-02-26 NOTE — Anesthesia Preprocedure Evaluation (Addendum)
Anesthesia Evaluation  Patient identified by MRN, date of birth, ID band Patient awake    Reviewed: Allergy & Precautions, NPO status , Patient's Chart, lab work & pertinent test results  Airway Mallampati: I  TM Distance: >3 FB Neck ROM: Full    Dental no notable dental hx. 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   Neuro/Psych PSYCHIATRIC DISORDERS Depression Chronic pain with chronic opioid use  Neuromuscular disease    GI/Hepatic Neg liver ROS, GERD  Medicated and Controlled,  Endo/Other  negative endocrine ROS  Renal/GU negative Renal ROS  negative genitourinary   Musculoskeletal  (+) Arthritis  (Ankylosing spondylitis), Osteoarthritis,    Abdominal (+) + obese,   Peds  Hematology negative hematology ROS (+)   Anesthesia Other Findings chronic pain syndrome  Reproductive/Obstetrics                            Anesthesia Physical  Anesthesia Plan  ASA: II  Anesthesia Plan: General   Post-op Pain Management:  Regional for Post-op pain   Induction: Intravenous  PONV Risk Score and Plan: 3 and Ondansetron, Dexamethasone and Midazolam  Airway Management Planned: Oral ETT  Additional Equipment: None  Intra-op Plan:   Post-operative Plan: Extubation in OR  Informed Consent: I have reviewed the patients History and Physical, chart, labs and discussed the procedure including the risks, benefits and alternatives for the proposed anesthesia with the patient or authorized representative who has indicated his/her understanding and acceptance.     Dental advisory given  Plan Discussed with: Anesthesiologist and CRNA  Anesthesia Plan Comments:        Anesthesia Quick Evaluation

## 2021-02-26 NOTE — Anesthesia Procedure Notes (Signed)
Procedure Name: Intubation Date/Time: 02/26/2021 12:20 PM Performed by: Stormy Fabian, CRNA Pre-anesthesia Checklist: Patient identified, Emergency Drugs available, Suction available and Patient being monitored Patient Re-evaluated:Patient Re-evaluated prior to induction Oxygen Delivery Method: Circle system utilized Preoxygenation: Pre-oxygenation with 100% oxygen Induction Type: IV induction, Cricoid Pressure applied and Rapid sequence Ventilation: Mask ventilation without difficulty Laryngoscope Size: 3 and McGraph Grade View: Grade I Tube type: Oral Tube size: 6.5 mm Number of attempts: 1 Airway Equipment and Method: Stylet and Video-laryngoscopy Placement Confirmation: ETT inserted through vocal cords under direct vision, positive ETCO2 and breath sounds checked- equal and bilateral Secured at: 20 cm Tube secured with: Tape Dental Injury: Teeth and Oropharynx as per pre-operative assessment

## 2021-02-26 NOTE — H&P (Signed)
Lauren Greene is an 55 y.o. female.   Chief Complaint: Back and right foot HPI: Lauren Greene is here for evaluation of pain in her back and right foot. She states she had a long history of back pain. She did have a fall approximate 10 to 12 years ago which resulted in a spinous process fracture which was operated on. She now has continued pain in the low back which is exacerbated by pain from walking on her right foot. She has known difficulty there after a prior surgery with also changes consistent with CRPS. She has undergone multiple treatment measures with Lauren. Cherylann Greene over the years and ultimately underwent a spinal cord stimulator trial recently which gave her 60 to 80% relief in the back and foot. She does not endorse any left leg pain. She has had an MRI of the lumbar spine and she is here to for a permanent spinal cord stimulator.   Past Medical History:  Diagnosis Date   ADHD (attention deficit hyperactivity disorder)    Ankylosing spondylitis (HCC)    Arthritis    Back pain    Broken femur (HCC) 08/27/2012   Chronic pain    chronic opioid use   GERD (gastroesophageal reflux disease)    Heart murmur    asymptomatic   History of methicillin resistant staphylococcus aureus (MRSA) 2009   Hypertension    Migraine    Osteoarthritis    PVC (premature ventricular contraction)    Raynaud disease    Raynaud phenomenon 04/06/2018   [facility] podiatry diagnosed 2020    Past Surgical History:  Procedure Laterality Date   ACHILLES TENDON LENGTHENING Right 02/01/2020   Procedure: ACHILLES TENDON LENGTHENING;  Surgeon: Lauren Hart, MD;  Location: [facility];  Service: Orthopedics;  Laterality: Right;   BACK SURGERY     BUNIONECTOMY     CERVICAL BIOPSY  W/ LOOP ELECTRODE EXCISION  1990's   in [place], [place]   ENDOMETRIAL BIOPSY  04-05-05 and 02-12-06   04-05-05 benign but too rapid growth. RXd Provera and repeat Bx 17mo. 02-12-06 Bx benign--Lauren. Leda Greene   FEMUR  IM NAIL Left 08/27/2012   Procedure: INTRAMEDULLARY (IM) RETROGRADE FEMORAL NAILING;  Surgeon: Lauren Hitch, MD;  Location: [facility];  Service: Orthopedics;  Laterality: Left;   FOOT ARTHRODESIS Right 02/01/2020   Procedure: TRIPLE ARTHRODESIS,  TENDO ACHILLES LENGTHENING;  Surgeon: Lauren Hart, MD;  Location: [facility];  Service: Orthopedics;  Laterality: Right;  LENGTH OF SURGERY: 4 HOURS   HEEL SPUR SURGERY     IM NAILING FEMORAL SHAFT FRACTURE Left 08/27/2012   Lauren Greene   KNEE SURGERY     multiple   SHOULDER SURGERY Left    TOTAL KNEE ARTHROPLASTY Bilateral    2009 and 2012   TUBAL LIGATION     VAGINAL HYSTERECTOMY  2018    Family History  Problem Relation Age of Onset   Cancer Mother    Breast cancer Mother    Bipolar disorder Mother    Heart failure Father    Cancer Father    Cancer Other    Heart failure Other    Bipolar disorder Sister    Social History:  reports that she quit smoking about 20 years ago. Her smoking use included cigarettes. She has a 20.00 pack-year smoking history. She has never used smokeless tobacco. She reports current alcohol use. She reports that she does not use drugs.  Allergies:  Allergies  Allergen Reactions  Wellbutrin [Bupropion]     Anger issues   Lyrica [Pregabalin] Swelling    Leg swelling    Medications Prior to Admission  Medication Sig Dispense Refill   Adalimumab (HUMIRA PEN) 40 MG/0.4ML PNKT Inject 40 mg into the skin every 14 (fourteen) days.     amLODipine (NORVASC) 2.5 MG tablet Take 1 tablet (2.5 mg total) by mouth daily. (Patient taking differently: Take 2.5 mg by mouth every morning.) 90 tablet 1   amphetamine-dextroamphetamine (ADDERALL XR) 30 MG 24 hr capsule TAKE 2 CAPSULES BY MOUTH EACH MORNING. 60 capsule 0   Diclofenac Sodium CR 100 MG 24 hr tablet TAKE 1 TABLET BY MOUTH TWICE DAILY. TAKE WITH FOOD (Patient taking differently: 200 mg daily.) 60 tablet 12   DULoxetine (CYMBALTA) 60  MG capsule TAKE 2 CAPSULES BY MOUTH DAILY. (Patient taking differently: 60 mg every morning.) 180 capsule 5   methocarbamol (ROBAXIN) 750 MG tablet Take 750 mg by mouth daily as needed for muscle spasms.     nystatin (MYCOSTATIN/NYSTOP) powder May apply as needed several times per day 120 g 5   omeprazole (PRILOSEC) 40 MG capsule 1 qd (Patient taking differently: 40 mg every morning. 1 qd) 90 capsule 3   [START ON 03/26/2021] oxyCODONE-acetaminophen (PERCOCET) 10-325 MG tablet Take 1 tablet by mouth every 6 (six) hours as needed for pain. Must last 30 days. (Patient taking differently: Take 2 tablets by mouth every morning. Additional 2 tablets of needed at bedtime) 120 tablet 0   traZODone (DESYREL) 100 MG tablet 1 to 2 qhs for sleep caution drowsiness (Patient taking differently: Take 200 mg by mouth at bedtime.) 180 tablet 3   lidocaine (XYLOCAINE) 5 % ointment APPLY TO AFFECTED AREA TWICE DAILY NO MORE THAN 5 DAYS AT A TIME 35.44 g 0   Oxybutynin Chloride 10 % GEL Apply daily to skin for overactive bladder (Patient not taking: Reported on 01/08/2021) 1 g 5    No results found for this or any previous visit (from the past 48 hour(s)). No results found.  Review of Systems General ROS: Negative Psychological ROS: Negative Ophthalmic ROS: Negative ENT ROS: Negative Hematological and Lymphatic ROS: Negative  Endocrine ROS: Negative Respiratory ROS: Negative Cardiovascular ROS: Negative Gastrointestinal ROS: Negative Genito-Urinary ROS: Negative Musculoskeletal ROS: Positive for back pain Neurological ROS: Positive for right foot pain Dermatological ROS: Negative Last menstrual period 03/18/2004. Physical Exam  General appearance: Alert, cooperative, in no acute distress Head: Normocephalic, atraumatic Eyes: Normal, EOM intact Oropharynx: Moist without lesions Back: Severe tenderness to palpation in the lumbar region Ext: Right foot shows edema and deformity changes in the distal toes  with venous congestion  Neurologic exam:  Mental status: alertness: alert, affect: normal Speech: fluent and clear Motor:strength symmetric 5/5 in bilateral lower extremities in all motor groups Sensory: intact to light touch in bilateral lower extremities Gait: normal  Imaging: MRI lumbar spine: There is a scoliotic curvature noted centered around the thoracolumbar junction. There is multilevel degenerative disease with some central stenosis noted at the lower lumbar region.  MRI thoracic spine: There is no obvious significant central stenosis, there is a kyphotic curvature.  Assessment/Plan Proceed with thoracic spinal cord stimulator  Lucy Chris, MD 02/26/2021, 11:39 AM

## 2021-02-26 NOTE — Progress Notes (Signed)
Per spinal cord stimulator rep says that they do not need to see patient post op, patient is goo to go and already set up per the rep.

## 2021-02-26 NOTE — Transfer of Care (Signed)
Immediate Anesthesia Transfer of Care Note  Patient: Lauren Greene  Procedure(s) Performed: THORACIC SPINAL CORD STIMULATOR PLACEMENT, PULSE GENERATOR (Back)  Patient Location: PACU  Anesthesia Type:General  Level of Consciousness: awake, drowsy and patient cooperative  Airway & Oxygen Therapy: Patient Spontanous Breathing and Patient connected to face mask oxygen  Post-op Assessment: Report given to RN and Post -op Vital signs reviewed and stable  Post vital signs: Reviewed and stable  Last Vitals:  Vitals Value Taken Time  BP 155/79 02/26/21 1409  Temp    Pulse 61 02/26/21 1413  Resp 15 02/26/21 1413  SpO2 100 % 02/26/21 1413  Vitals shown include unvalidated device data.  Last Pain:  Vitals:   02/26/21 1409  TempSrc:   PainSc: Asleep         Complications: No notable events documented.

## 2021-02-26 NOTE — Discharge Instructions (Addendum)
NEUROSURGERY DISCHARGE INSTRUCTIONS  Admission diagnosis: chronic pain syndrome  Operative procedure: Placement of thoracic spinal cord stimulator  What to do after you leave the hospital:  Recommended diet: regular diet. Increase protein intake to promote wound healing.  Recommended activity: no lifting, driving, or strenuous exercise for 6 weeks . You should walk multiple times per day  Special Instructions  No straining, no heavy lifting > 10lbs x 4 weeks.  Keep incision area clean and dry. May shower in 2 days. No baths or pools for 6 weeks.  Please remove dressing in two days no need to apply a bandage afterwards  You have no sutures to remove, the skin is closed with adhesive  Please take pain medications as directed. Take a stool softener if on pain medications   Please Report any of the following: Nausea or Vomiting, Temperature is greater than 101.70F (38.1C) degrees, Dizziness, Abdominal Pain, Difficulty Breathing or Shortness of Breath, Inability to Eat, drink Fluids, or Take medications, Bleeding, swelling, or drainage from surgical incision sites, New numbness or weakness, and Bowel or bladder dysfunction to the neurosurgeon on call at [phone]  Additional Follow up appointments Please follow up with Manning Charity PA-C in [place] clinic as scheduled in 2-3 weeks   Please see below for scheduled appointments:  Future Appointments  Date Time Provider Department Center  04/04/2021  1:10 PM Babs Sciara, MD [facility]  04/24/2021  2:40 PM Edward Jolly, MD [facility] None  01/13/2023  2:45 PM Jerene Bears, MD [facility]    AMBULATORY SURGERY  DISCHARGE INSTRUCTIONS   The drugs that you were given will stay in your system until tomorrow so for the next 24 hours you should not:  Drive an automobile Make any legal decisions Drink any alcoholic beverage   You may resume regular meals tomorrow.  Today it is better to start with liquids and gradually work  up to solid foods.  You may eat anything you prefer, but it is better to start with liquids, then soup and crackers, and gradually work up to solid foods.   Please notify your doctor immediately if you have any unusual bleeding, trouble breathing, redness and pain at the surgery site, drainage, fever, or pain not relieved by medication.    Additional Instructions:  Please contact your physician with any problems or Same Day Surgery at [phone], Monday through Friday 6 am to 4 pm, or [place] at [facility] number at [phone].

## 2021-02-26 NOTE — Op Note (Signed)
Operative Note   SURGERY DATE: 02/26/2021   PRE-OP DIAGNOSIS:  Chronic pain syndrome   POST-OP DIAGNOSIS: Post-Op Diagnosis Codes: Chronic pain syndrome   Procedure(s) with comments: Percutaneous thoracic spinal cord stimulator lead placement Left flank pulse generator placement   SURGEON:     Malen Gauze, MD           ANESTHESIA: General    OPERATIVE FINDINGS: Successful placement of thoracic spinal cord stimulator and pulse generator   Indication Lauren Greene was seen in clinic on 12/6 after having undergone a previous spinal cord stimulator trial and did well with greater than 60% relief of pain in the foot and back. The patient wished to proceed to permanent implant to decrease medication use and achieve better pain control. Risks including damage to spinal cord, weakness, hematoma, infection, failure of pain relief, post-operative pain, need for revision, stroke, heart attack, pneumonia, and spinal cord injury were discussed.      Procedure The patient was brought to the operating room where vascular access was obtained and she was intubated by the anesthesia service.   She was placed prone on gel rolls. Antibiotics were given. Fluoroscopy was used to confirm planned incision in lumbar area at the level of L1-2 in the midline.  An additional incision was planned in the left flank for the pulse generator placement.  The patient was prepped and draped in a sterile fashion. A hard time out was performed. Local anesthetic was instilled into planned incision sites.    The midline lumbar incision was opened and taken to the fascia using cautery.Next, a Touhy needle was used to insert in a paramedian approach to enter the interlaminar space between T12 and L1.  Once loss of resistance was identified, this was confirmed with a metal stylette.  Next, the percutaneous lead was passed in the rostral direction using fluoroscopy as guidance to keep in the midline.  This was passed without  resistance to the level of the T8 vertebral body on the left.  Lateral views were obtained to confirm we were in the dorsal epidural space.  Next, the same procedure was performed contralaterally. We placed an additional lead on the right at T8/9, slightly offset to the previous lead to allow for better coverage. The leads were secured with anchors in the fascia.  The incision was irrigated and hemostasis obtained.    Next, the left flank incision was opened and taken to the fascia and inferior dissection to allow for a large enough pocket for the placement of the battery.  The incision was irrigated and hemostasis obtained.  Next, a tunneler was used to pass the electrode leads from the lumbar incision to the left flank.  There, the electrodes were attached to the pulse generator and impedances were found to be normal.  The pulse generator was placed into the incision taking care to place the wires beneath the pulse generator.    A final fluoroscopic image was taken show good placement of percutaneous leads. The battery and wires were secured to fascia with suture with the battery placed in an antibiotic pouch. Vancomycin powder was placed into the incisions.  Next, a combination of 0 and 2-0  Vicryls were used to close the incisions with Dermabond on the skin in flank and lumbar midline.   The patient was returned to supine position and the patient was seen to be moving all extremities symmetrically and was taken to PACU for recovery. The family was updated and all questions  answered.      ESTIMATED BLOOD LOSS:   20 cc   SPECIMENS None   IMPLANT  KIT LEAD - [id number]  Inventory Item: KIT LEAD Serial no.:  Model/Cat no.: [id number]  Implant name: KIT LEAD - [id number] Laterality: N/A Area: Spine Thoracic  Manufacturer: [employer] Date of Manufacture:    Action: Implanted Number Used: 1   Device Identifier:  Device Identifier Type:     KIT LEAD - [id number]  Inventory Item: KIT LEAD  Serial no.:  Model/Cat no.: [id number]  Implant name: KIT LEAD - [id number] Laterality: N/A Area: Spine Thoracic  Manufacturer: [employer] Date of Manufacture:    Action: Implanted Number Used: 1   Device Identifier:  Device Identifier Type:     DEVICE Lucillie Garfinkel [id number]  Inventory Item: DEVICE IMPLANT NEUROSTIMINATOR Serial no.: [id number] Model/Cat no.: [id number]  Implant name: DEVICE Lucillie Garfinkel [id number] Laterality: N/A Area: Spine Thoracic  Manufacturer: [employer] NEUROMOD PAIN MGMT Date of Manufacture:    Action: Implanted Number Used: 1   Device Identifier:  Device Identifier Type:     ENVELOPE ABSORB ANTIBACTERIAL - [id number]  Inventory Item: ENVELOPE ABSORB ANTIBACTERIAL Serial no.:  Model/Cat no.: [id number]  Implant name: ENVELOPE ABSORB ANTIBACTERIAL - [id number] Laterality: N/A Area: Spine Thoracic  Manufacturer: [employer] Date of Manufacture:    Action: Implanted Number Used: 1   Device Identifier:  Device Identifier Type:     I performed the case in its entirety with the assistance of Cooper Render, [place]   Deetta Perla, [place]

## 2021-02-26 NOTE — Interval H&P Note (Signed)
History and Physical Interval Note:  02/26/2021 11:40 AM  Lauren Greene  has presented today for surgery, with the diagnosis of chronic pain syndrome.  The various methods of treatment have been discussed with the patient and family. After consideration of risks, benefits and other options for treatment, the patient has consented to  Procedure(s): THORACIC SPINAL CORD STIMULATOR PLACEMENT, PULSE GENERATOR (N/A) as a surgical intervention.  The patient's history has been reviewed, patient examined, no change in status, stable for surgery.  I have reviewed the patient's chart and labs.  Questions were answered to the patient's satisfaction.     Lucy Chris

## 2021-02-26 NOTE — Progress Notes (Signed)
Pharmacy Antibiotic Note  Lauren Greene is a 55 y.o. female admitted on (Not on file) with surgical prophylaxis.  Pharmacy has been consulted for Cefazolin dosing.  Plan: TBW = 111.1 kg (01/31/21)  Cefazolin 2 gm IV X 1 60 min pre-op ordered for 12/12 @ 0500.     No data recorded.  No results for input(s): WBC, CREATININE, LATICACIDVEN, VANCOTROUGH, VANCOPEAK, VANCORANDOM, GENTTROUGH, GENTPEAK, GENTRANDOM, TOBRATROUGH, TOBRAPEAK, TOBRARND, AMIKACINPEAK, AMIKACINTROU, AMIKACIN in the last 168 hours.  CrCl cannot be calculated (Patient's most recent lab result is older than the maximum 21 days allowed.).    Allergies  Allergen Reactions   Wellbutrin [Bupropion]     Anger issues   Lyrica [Pregabalin] Swelling    Leg swelling    Antimicrobials this admission:   >>    >>   Dose adjustments this admission:   Microbiology results:  BCx:   UCx:    Sputum:    MRSA PCR:   Thank you for allowing pharmacy to be a part of this patient's care.  Toyia Jelinek D 02/26/2021 1:20 AM

## 2021-02-27 ENCOUNTER — Encounter: Payer: Self-pay | Admitting: Neurosurgery

## 2021-02-27 NOTE — Anesthesia Postprocedure Evaluation (Signed)
Anesthesia Post Note  Patient: Lauren Greene  Procedure(s) Performed: THORACIC SPINAL CORD STIMULATOR PLACEMENT, PULSE GENERATOR (Back)  Patient location during evaluation: PACU Anesthesia Type: General Level of consciousness: awake and alert Pain management: pain level controlled Vital Signs Assessment: post-procedure vital signs reviewed and stable Respiratory status: spontaneous breathing, nonlabored ventilation and respiratory function stable Cardiovascular status: blood pressure returned to baseline and stable Postop Assessment: no apparent nausea or vomiting Anesthetic complications: no   No notable events documented.   Last Vitals:  Vitals:   02/26/21 1500 02/26/21 1529  BP: (!) 105/53   Pulse: 66   Resp: 17   Temp:  36.7 C  SpO2: 96%     Last Pain:  Vitals:   02/26/21 1529  TempSrc: Temporal  PainSc: 4                  Foye Deer

## 2021-02-28 ENCOUNTER — Encounter: Payer: Self-pay | Admitting: Family Medicine

## 2021-03-01 MED ORDER — CEPHALEXIN 500 MG PO CAPS: ORAL_CAPSULE | ORAL | 0 refills | Status: DC

## 2021-03-01 NOTE — Telephone Encounter (Signed)
Nurses Urine dipsticks are not the best way I prefer her urine dipstick with microscopic urine as well as urine culture Please connect with patient This would be the ideal way Also to is patient having any urinary tract symptoms etc.  Would be preferable for patient to be seen with urine specimen microscopic etc. Schedule completely full but we can figure out a way to help her depending on symptomatology

## 2021-03-01 NOTE — Telephone Encounter (Signed)
Patient states she had back surgery on Monday and is unable to drive- the hospital is who ran the urine. Patient states she not having any real symptoms other than being run down and peeing all the time but states it is normal for her to have to pee all the time.

## 2021-03-01 NOTE — Telephone Encounter (Signed)
It is hard to know if urinary symptoms are due to a urinary tract infection or not  It would be reasonable to do Keflex 500 mg 1 taken 3 times daily for the next 3 days as a treatment

## 2021-03-13 ENCOUNTER — Other Ambulatory Visit: Payer: Self-pay | Admitting: Family Medicine

## 2021-04-04 ENCOUNTER — Encounter: Payer: Self-pay | Admitting: Family Medicine

## 2021-04-04 ENCOUNTER — Other Ambulatory Visit: Payer: Self-pay

## 2021-04-04 ENCOUNTER — Ambulatory Visit (INDEPENDENT_AMBULATORY_CARE_PROVIDER_SITE_OTHER): Payer: PPO | Admitting: Family Medicine

## 2021-04-04 VITALS — BP 138/76 | HR 79 | Temp 97.6°F | Ht 68.0 in | Wt 250.0 lb

## 2021-04-04 DIAGNOSIS — F988 Other specified behavioral and emotional disorders with onset usually occurring in childhood and adolescence: Secondary | ICD-10-CM | POA: Diagnosis not present

## 2021-04-04 DIAGNOSIS — M1990 Unspecified osteoarthritis, unspecified site: Secondary | ICD-10-CM

## 2021-04-04 DIAGNOSIS — I10 Essential (primary) hypertension: Secondary | ICD-10-CM

## 2021-04-04 MED ORDER — AMPHETAMINE-DEXTROAMPHET ER 30 MG PO CP24: ORAL_CAPSULE | ORAL | 0 refills | Status: DC

## 2021-04-04 NOTE — Progress Notes (Signed)
° °  Subjective:    Patient ID: Lauren Greene, female    DOB: 14-Oct-1965, 56 y.o.   MRN: [id number]  HPI This patient has adult ADD. Takes medication responsibly. Medication does help the patient focus in be more functional. Patient relates that they are or not abusing the medication or misusing the medication. The patient understands that if they're having any negative side effects such as elevated high blood pressure severe headaches they would need stop the medication follow-up immediately. They also understand that the prescriptions are to last for 3 months then the patient will need to follow-up before having further prescriptions.  Patient compliance daily  Does medication help patient function /attention better  yes  Side effects  none   Review of Systems     Objective:   Physical Exam  General-in no acute distress Eyes-no discharge Lungs-respiratory rate normal, CTA CV-no murmurs,RRR Extremities skin warm dry no edema Neuro grossly normal Behavior normal, alert       Assessment & Plan:  The patient was seen today as part of the visit regarding ADD.  Patient is stable on current regimen.  Appropriate prescriptions prescribed.  Medications were reviewed with the patient as well as compliance. Side effects were checked for. Discussion regarding effectiveness was held. Prescriptions were electronically sent in.  Patient reminded to follow-up in approximately 3 months.   Plans to [facility] law with drug registry was checked and verified while present with the patient. Medications will be sent in  Patient's blood pressure under good control continue current measures  Moderate obesity patient working hard at portion control difficult for her to exercise because of osteoarthritis  Osteoarthritis especially in her feet limits her mobility  Has pain stimulator along with pain medicine but still having significant amount of discomfort

## 2021-04-06 ENCOUNTER — Other Ambulatory Visit: Payer: Self-pay | Admitting: Family Medicine

## 2021-04-06 DIAGNOSIS — G894 Chronic pain syndrome: Secondary | ICD-10-CM | POA: Diagnosis not present

## 2021-04-06 DIAGNOSIS — M4126 Other idiopathic scoliosis, lumbar region: Secondary | ICD-10-CM | POA: Diagnosis not present

## 2021-04-06 DIAGNOSIS — M5136 Other intervertebral disc degeneration, lumbar region: Secondary | ICD-10-CM | POA: Diagnosis not present

## 2021-04-06 DIAGNOSIS — M545 Low back pain, unspecified: Secondary | ICD-10-CM | POA: Diagnosis not present

## 2021-04-06 DIAGNOSIS — M2578 Osteophyte, vertebrae: Secondary | ICD-10-CM | POA: Diagnosis not present

## 2021-04-09 ENCOUNTER — Other Ambulatory Visit: Payer: Self-pay

## 2021-04-09 MED ORDER — LIDOCAINE 5 % EX OINT: TOPICAL_OINTMENT | CUTANEOUS | 3 refills | Status: DC

## 2021-04-12 DIAGNOSIS — G894 Chronic pain syndrome: Secondary | ICD-10-CM | POA: Diagnosis not present

## 2021-04-12 DIAGNOSIS — M47814 Spondylosis without myelopathy or radiculopathy, thoracic region: Secondary | ICD-10-CM | POA: Diagnosis not present

## 2021-04-12 DIAGNOSIS — M4184 Other forms of scoliosis, thoracic region: Secondary | ICD-10-CM | POA: Diagnosis not present

## 2021-04-16 ENCOUNTER — Telehealth: Payer: Self-pay | Admitting: Family Medicine

## 2021-04-16 NOTE — Telephone Encounter (Signed)
Patient states she called the pharmacy to get refills on Cymbalta but the pharmacy says no refills on file. Advised patient prescription was sent in  01/2021 which was a 90 days supply with 5 refills. Patient states she will call the pharmacy to see what's going on and get back with [place].

## 2021-04-16 NOTE — Telephone Encounter (Signed)
Pt called and would like to talk to the nurse about her Cymbalta.   [phone]

## 2021-04-24 ENCOUNTER — Encounter: Payer: Self-pay | Admitting: Student in an Organized Health Care Education/Training Program

## 2021-04-24 ENCOUNTER — Ambulatory Visit
Payer: PPO | Attending: Student in an Organized Health Care Education/Training Program | Admitting: Student in an Organized Health Care Education/Training Program

## 2021-04-24 ENCOUNTER — Other Ambulatory Visit: Payer: Self-pay

## 2021-04-24 VITALS — BP 154/95 | HR 71 | Temp 97.2°F | Resp 16 | Ht 68.0 in | Wt 240.0 lb

## 2021-04-24 DIAGNOSIS — M25511 Pain in right shoulder: Secondary | ICD-10-CM | POA: Insufficient documentation

## 2021-04-24 DIAGNOSIS — G8929 Other chronic pain: Secondary | ICD-10-CM | POA: Diagnosis not present

## 2021-04-24 DIAGNOSIS — Z9889 Other specified postprocedural states: Secondary | ICD-10-CM | POA: Diagnosis not present

## 2021-04-24 DIAGNOSIS — M961 Postlaminectomy syndrome, not elsewhere classified: Secondary | ICD-10-CM | POA: Diagnosis not present

## 2021-04-24 DIAGNOSIS — G894 Chronic pain syndrome: Secondary | ICD-10-CM | POA: Diagnosis not present

## 2021-04-24 DIAGNOSIS — M48062 Spinal stenosis, lumbar region with neurogenic claudication: Secondary | ICD-10-CM | POA: Insufficient documentation

## 2021-04-24 DIAGNOSIS — M25512 Pain in left shoulder: Secondary | ICD-10-CM | POA: Insufficient documentation

## 2021-04-24 DIAGNOSIS — M19012 Primary osteoarthritis, left shoulder: Secondary | ICD-10-CM | POA: Diagnosis not present

## 2021-04-24 DIAGNOSIS — M19011 Primary osteoarthritis, right shoulder: Secondary | ICD-10-CM | POA: Insufficient documentation

## 2021-04-24 DIAGNOSIS — M5416 Radiculopathy, lumbar region: Secondary | ICD-10-CM | POA: Insufficient documentation

## 2021-04-24 DIAGNOSIS — M5136 Other intervertebral disc degeneration, lumbar region: Secondary | ICD-10-CM | POA: Insufficient documentation

## 2021-04-24 DIAGNOSIS — Z79891 Long term (current) use of opiate analgesic: Secondary | ICD-10-CM | POA: Diagnosis not present

## 2021-04-24 HISTORY — DX: Primary osteoarthritis, right shoulder: M19.011

## 2021-04-24 MED ORDER — OXYCODONE-ACETAMINOPHEN 10-325 MG PO TABS: 1.0000 | ORAL_TABLET | Freq: Four times a day (QID) | ORAL | 0 refills | Status: AC | PRN

## 2021-04-24 NOTE — Progress Notes (Signed)
PROVIDER NOTE: Information contained herein reflects review and annotations entered in association with encounter. Interpretation of such information and data should be left to medically-trained personnel. Information provided to patient can be located elsewhere in the medical record under "Patient Instructions". Document created using STT-dictation technology, any transcriptional errors that may result from process are unintentional.    Patient: Lauren Greene  Service Category: E/M  Provider: Gillis Santa, MD  DOB: [birth year]  DOS: 04/24/2021  Specialty: Interventional Pain Management  MRN: [id number]  Setting: Ambulatory outpatient  PCP: Kathyrn Drown, MD  Type: Established Patient    Referring Provider: Kathyrn Drown, MD  Location: Office  Delivery: Face-to-face     HPI  Lauren Greene, a 56 y.o. year old female, is here today because of her Localized osteoarthritis of shoulder regions, bilateral [M19.011, M19.012]. [name] primary complain today is Foot Pain and Shoulder Pain (L>R) Last encounter: My last encounter with her was on 01/31/2021. Pertinent problems: 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 Pain Assessment: Severity of Chronic pain is reported as a 9 /10. Location: Shoulder Right, Left (L>R)/Denies. Onset: More than a month ago. Quality: Constant, Aching. Timing: Constant. Modifying factor(s): Heating pad. Vitals:  height is $RemoveB'5\' 8"'OLGULpLt$  (1.727 m) and weight is 240 lb (108.9 kg). Her temporal temperature is 97.2 F (36.2 C) (abnormal). Her blood pressure is 154/95 (abnormal) and her pulse is 71. Her respiration is 16 and oxygen saturation is 100%.   Reason for encounter: medication  management.  Also worsening bilateral shoulder pain, left greater than right.  [name] presents today for medication management.  She is status post [employer] spinal cord stimulator implant with Dr. Lacinda Axon.  Unfortunately, she states that she is not experiencing the same pain relief as she was during her trial.  During her postoperative visit with Dr. Lacinda Axon, x-rays of her thoracic and lumbar spine were obtained which did not show any lead fracture or lead migration.  She states that she is having to charge the battery daily.  She has been working with Quita Skye with [employer] to optimize her settings.  Given lack of sustained pain relief with spinal cord stimulation, we will hold off on weaning her Percocet.  Continue at its current dose.  Patient is also endorsing bilateral shoulder pain related to shoulder osteoarthritis.  She has had previous shoulder steroid injections, posterior approach without fluoroscopy over 2 years ago.  She was having increased pain with shoulder abduction and wants to repeat injections.  Pharmacotherapy Assessment  Analgesic: Percocet 10 mg 4 times daily as needed, quantity 120/month MME equals 60   Monitoring: [place] PMP: PDMP reviewed during this encounter.       Pharmacotherapy: No side-effects or adverse reactions reported. Compliance: No problems identified. Effectiveness: Clinically acceptable.  Al Decant, RN  04/24/2021  2:54 PM  Sign when Signing Visit Safety precautions to be maintained throughout the outpatient stay will include: orient to surroundings, keep bed in low position, maintain call bell within reach at all times, provide assistance with transfer out of bed and ambulation.   Nursing Pain Medication Assessment:  Safety precautions to be maintained throughout the outpatient stay will include: orient to surroundings, keep bed in low position, maintain call bell within reach at all times, provide assistance with  transfer out of bed and ambulation.  Medication  Inspection Compliance: Pill count conducted under aseptic conditions, in front of the patient. Neither the pills nor the bottle was removed from the patient's sight at any time. Once count was completed pills were immediately returned to the patient in their original bottle.  Medication: Oxycodone/APAP Pill/Patch Count:  52 of 120 pills remain Pill/Patch Appearance: Markings consistent with prescribed medication Bottle Appearance: Standard pharmacy container. Clearly labeled. Filled Date: 1 / 20 / 2023 Last Medication intake:  Today     UDS:  Summary  Date Value Ref Range Status  06/08/2020 Note  Final    Comment:    ==================================================================== [employer] Select 13 ([facility]) ==================================================================== Test                             Result       Flag       Units  Drug Present and Declared for Prescription Verification   Amphetamine                    >3953        EXPECTED   ng/mg creat    Amphetamine is available as a schedule II prescription drug.    Oxycodone                      >3953        EXPECTED   ng/mg creat   Oxymorphone                    1119         EXPECTED   ng/mg creat   Noroxycodone                   >3953        EXPECTED   ng/mg creat   Noroxymorphone                 310          EXPECTED   ng/mg creat    Sources of oxycodone are scheduled prescription medications.    Oxymorphone, noroxycodone, and noroxymorphone are expected    metabolites of oxycodone. Oxymorphone is also available as a    scheduled prescription medication.  ==================================================================== Test                      Result    Flag   Units      Ref Range   Creatinine              253              mg/dL      >=20 ==================================================================== Declared Medications:  The flagging and interpretation on this report are based on the  following  declared medications.  Unexpected results may arise from  inaccuracies in the declared medications.   **Note: The testing scope of this panel includes these medications:   Amphetamine (Adderall)  Oxycodone (Percocet)   **Note: The testing scope of this panel does not include the  following reported medications:   Acetaminophen (Percocet)  Amlodipine (Norvasc)  Diclofenac  Duloxetine (Cymbalta)  Lidocaine (Xylocaine)  Methocarbamol (Robaxin)  Nystatin (Mycostatin)  Omeprazole (Prilosec)  Oxybutynin  Trazodone (Desyrel) ==================================================================== For clinical consultation, please call [phone]. ====================================================================      ROS  Constitutional: Denies any fever or chills Gastrointestinal: No reported hemesis, hematochezia, vomiting, or  acute GI distress Musculoskeletal:  Low back pain, right foot pain, right ankle pain, bilateral shoulder pain Neurological: No reported episodes of acute onset apraxia, aphasia, dysarthria, agnosia, amnesia, paralysis, loss of coordination, or loss of consciousness  Medication Review  Adalimumab, DULoxetine, Diclofenac Sodium CR, amLODipine, amphetamine-dextroamphetamine, lidocaine, methocarbamol, nystatin, omeprazole, oxyCODONE-acetaminophen, and traZODone  History Review  Allergy: Ms. Gergen is allergic to wellbutrin [bupropion] and lyrica [pregabalin]. Drug: Ms. Kisner  reports no history of drug use. Alcohol:  reports current alcohol use. Tobacco:  reports that she quit smoking about 20 years ago. Her smoking use included cigarettes. She has a 20.00 pack-year smoking history. She has never used smokeless tobacco. Social: Ms. Arkin  reports that she quit smoking about 20 years ago. Her smoking use included cigarettes. She has a 20.00 pack-year smoking history. She has never used smokeless tobacco. She reports current alcohol use. She reports that  she does not use drugs. Medical:  has a past medical history of ADHD (attention deficit hyperactivity disorder), Ankylosing spondylitis ([place]), Arthritis, Back pain, Broken femur ([place]) (08/27/2012), Chronic pain, GERD (gastroesophageal reflux disease), Heart murmur, History of methicillin resistant staphylococcus aureus (MRSA) (2009), Hypertension, Migraine, Osteoarthritis, PVC (premature ventricular contraction), Raynaud disease, and Raynaud phenomenon (04/06/2018). Surgical: Ms. Dorsi  has a past surgical history that includes Total knee arthroplasty (Bilateral); Heel spur surgery; Back surgery; IM nailing femoral shaft fracture (Left, 08/27/2012); Femur IM nail (Left, 08/27/2012); Cervical biopsy w/ loop electrode excision (1990's); Endometrial biopsy (04-05-05 and 02-12-06); Tubal ligation; Shoulder surgery (Left); Vaginal hysterectomy (2018); Foot arthrodesis (Right, 02/01/2020); Achilles tendon lengthening (Right, 02/01/2020); Knee surgery; Bunionectomy; and Spinal cord stimulator insertion (N/A, 02/26/2021). Family: family history includes Bipolar disorder in her mother and sister; Breast cancer in her mother; Cancer in her father, mother, and another family member; Heart failure in her father and another family member.  Laboratory Chemistry Profile   Renal Lab Results  Component Value Date   BUN 18 02/26/2021   CREATININE 0.53 02/26/2021   BCR 23 11/09/2020   GFRAA 115 08/30/2019   GFRNONAA >60 02/26/2021    Hepatic Lab Results  Component Value Date   AST 18 09/13/2020   ALT 21 09/13/2020   ALBUMIN 4.4 09/13/2020   ALKPHOS 123 (H) 09/13/2020   LIPASE 25 01/12/2016    Electrolytes Lab Results  Component Value Date   NA 137 02/26/2021   K 3.9 02/26/2021   CL 107 02/26/2021   CALCIUM 8.6 (L) 02/26/2021    Bone Lab Results  Component Value Date   VD25OH 31 01/07/2013    Inflammation (CRP: Acute Phase) (ESR: Chronic Phase) Lab Results  Component Value Date   CRP 2  09/13/2020   ESRSEDRATE 4 04/06/2018         Note: Above Lab results reviewed.  Recent Imaging Review  DG Thoracic Spine 2 View CLINICAL DATA:  Spinal stimulator  EXAM: THORACIC SPINE 2 VIEWS  COMPARISON:  MRI 09/26/2020  FINDINGS: Two low resolution intraoperative spot views of the thoracic spine. Total fluoroscopy time was 11 minutes 20 seconds. The images demonstrate a ascending thoracic stimulator leads within the posterior spinal canal with tips at approximate level of T8 and T9.  IMPRESSION: Intraoperative fluoroscopic assistance provided during spinal stimulator placement  Electronically Signed   By: Donavan Foil M.D.   On: 02/26/2021 15:22 DG C-Arm 1-60 Min-No Report Fluoroscopy was utilized by the requesting physician.  No radiographic  interpretation.  DG C-Arm 1-60 Min-No Report Fluoroscopy was utilized by the requesting physician.  No radiographic  interpretation.  Note: Reviewed        Physical Exam  General appearance: Well nourished, well developed, and well hydrated. In no apparent acute distress Mental status: Alert, oriented x 3 (person, place, & time)       Respiratory: No evidence of acute respiratory distress Eyes: PERLA Vitals: BP (!) 154/95    Pulse 71    Temp (!) 97.2 F (36.2 C) (Temporal)    Resp 16    Ht $R'5\' 8"'lA$  (1.727 m)    Wt 240 lb (108.9 kg)    LMP 03/18/2004    SpO2 100%    BMI 36.49 kg/m  BMI: Estimated body mass index is 36.49 kg/m as calculated from the following:   Height as of this encounter: $RemoveBeforeD'5\' 8"'whwwlaXRwXQFVA$  (1.727 m).   Weight as of this encounter: 240 lb (108.9 kg). Ideal: Ideal body weight: 63.9 kg (140 lb 14 oz) Adjusted ideal body weight: 81.9 kg (180 lb 8.4 oz)  Upper Extremity (UE) Exam    Side: Right upper extremity  Side: Left upper extremity  Skin & Extremity Inspection: Skin color, temperature, and hair growth are WNL. No peripheral edema or cyanosis. No masses, redness, swelling, asymmetry, or associated skin lesions. No  contractures.  Skin & Extremity Inspection: Skin color, temperature, and hair growth are WNL. No peripheral edema or cyanosis. No masses, redness, swelling, asymmetry, or associated skin lesions. No contractures.  Functional ROM: Pain restricted ROM for shoulder  Functional ROM: Pain restricted ROM for shoulder  Muscle Tone/Strength: Functionally intact. No obvious neuro-muscular anomalies detected.  Muscle Tone/Strength: Functionally intact. No obvious neuro-muscular anomalies detected.  Sensory (Neurological): Musculoskeletal pain pattern          Sensory (Neurological): Musculoskeletal pain pattern          Palpation: No palpable anomalies              Palpation: No palpable anomalies              Provocative Test(s):  Phalen's test: deferred Tinel's test: deferred Apley's scratch test (touch opposite shoulder):  Action 1 (Across chest): Decreased ROM Action 2 (Overhead): Decreased ROM Action 3 (LB reach): Decreased ROM   Provocative Test(s):  Phalen's test: deferred Tinel's test: deferred Apley's scratch test (touch opposite shoulder):  Action 1 (Across chest): Decreased ROM Action 2 (Overhead): Decreased ROM Action 3 (LB reach): Decreased ROM    Thoracic Spine Area Exam  Skin & Axial Inspection: Well healed scar from previous spine surgery detected Alignment: Symmetrical Functional ROM: Unrestricted ROM Stability: No instability detected Muscle Tone/Strength: Functionally intact. No obvious neuro-muscular anomalies detected. Sensory (Neurological): Unimpaired Muscle strength & Tone: No palpable anomalies  Lumbar Spine Area Exam  Skin & Axial Inspection: Well healed scar from previous spine surgery detected Alignment: Symmetrical Functional ROM: Unrestricted ROM       Stability: No instability detected Muscle Tone/Strength: Functionally intact. No obvious neuro-muscular anomalies detected. Sensory (Neurological): Unimpaired Palpation:  IPG present         Gait & Posture  Assessment  Ambulation: Unassisted Gait: Relatively normal for age and body habitus Posture: WNL  Lower Extremity Exam    Side: Right lower extremity  Side: Left lower extremity  Stability: No instability observed          Stability: No instability observed          Skin & Extremity Inspection: Skin color, temperature, and hair growth are WNL. No peripheral edema or cyanosis. No masses, redness, swelling,  asymmetry, or associated skin lesions. No contractures.  Skin & Extremity Inspection: Skin color, temperature, and hair growth are WNL. No peripheral edema or cyanosis. No masses, redness, swelling, asymmetry, or associated skin lesions. No contractures.  Functional ROM: Pain restricted ROM  right ankle and foot          Functional ROM: Unrestricted ROM                  Muscle Tone/Strength: Functionally intact. No obvious neuro-muscular anomalies detected.  Muscle Tone/Strength: Functionally intact. No obvious neuro-muscular anomalies detected.  Sensory (Neurological): Neurogenic pain pattern        Sensory (Neurological): Unimpaired        DTR: Patellar: deferred today Achilles: deferred today Plantar: deferred today  DTR: Patellar: deferred today Achilles: deferred today Plantar: deferred today  Palpation: No palpable anomalies  Palpation: No palpable anomalies    Assessment   Status Diagnosis  Having a Flare-up Having a Flare-up Persistent 1. Localized osteoarthritis of shoulder regions, bilateral   2. Chronic pain of both shoulders   3. Failed back surgical syndrome   4. S/P lumbar microdiscectomy (left L4/5 with micordissection)   5. Chronic radicular lumbar pain   6. Encounter for long-term opiate analgesic use   7. Other intervertebral disc degeneration, lumbar region   8. Spinal stenosis, lumbar region, with neurogenic claudication   9. Chronic pain syndrome      Updated Problems: Problem  Localized Osteoarthritis of Shoulder Regions, Bilateral  Chronic Pain of  Both Shoulders  Failed Back Surgical Syndrome  S/P lumbar microdiscectomy (left L4/5 with micordissection)    Plan of Care    Ms. JILLENE WEHRENBERG has a current medication list which includes the following long-term medication(s): amlodipine, amphetamine-dextroamphetamine, amphetamine-dextroamphetamine, amphetamine-dextroamphetamine, duloxetine, omeprazole, and trazodone.  Pharmacotherapy (Medications Ordered): Meds ordered this encounter  Medications   oxyCODONE-acetaminophen (PERCOCET) 10-325 MG tablet    Sig: Take 1 tablet by mouth every 6 (six) hours as needed for pain. Must last 30 days.    Dispense:  120 tablet    Refill:  0    Chronic Pain. (STOP Act - Not applicable). Fill one day early if closed on scheduled refill date.   oxyCODONE-acetaminophen (PERCOCET) 10-325 MG tablet    Sig: Take 1 tablet by mouth every 6 (six) hours as needed for pain. Must last 30 days.    Dispense:  120 tablet    Refill:  0    Chronic Pain. (STOP Act - Not applicable). Fill one day early if closed on scheduled refill date.   oxyCODONE-acetaminophen (PERCOCET) 10-325 MG tablet    Sig: Take 1 tablet by mouth every 6 (six) hours as needed for pain. Must last 30 days.    Dispense:  120 tablet    Refill:  0    Chronic Pain. (STOP Act - Not applicable). Fill one day early if closed on scheduled refill date.   Orders:  Orders Placed This Encounter  Procedures   SHOULDER INJECTION    Standing Status:   Future    Standing Expiration Date:   06/22/2021    Scheduling Instructions:     Procedure: Intra-articular shoulder (Glenohumeral) joint injection     Side: Bilateral     Level: Glenohumeral joint               Sedation: without     Timeframe: As permitted by the schedule    Order Specific Question:   Where will this procedure be performed?  Answer:   [facility]   [employer] Select 13 (MW), Urine    Volume: 30 ml(s). Minimum 3 ml of urine is needed. Document temperature of fresh  sample. Indications: Long term (current) use of opiate analgesic (D97.416)    Order Specific Question:   Release to patient    Answer:   Immediate   Follow-up plan:   Return in about 1 week (around 05/01/2021) for B/L posterior shoulder injection , in clinic [facility].    Recent Visits Date Type Provider Dept  01/31/21 Procedure visit Gillis Santa, MD [facility]  01/24/21 Procedure visit Gillis Santa, MD [facility]  Showing recent visits within past 90 days and meeting all other requirements Today's Visits Date Type Provider Dept  04/24/21 Office Visit Gillis Santa, MD [facility]  Showing today's visits and meeting all other requirements Future Appointments Date Type Provider Dept  04/25/21 Appointment Gillis Santa, MD [facility]  07/17/21 Appointment Gillis Santa, MD [facility]  Showing future appointments within next 90 days and meeting all other requirements  I discussed the assessment and treatment plan with the patient. The patient was provided an opportunity to ask questions and all were answered. The patient agreed with the plan and demonstrated an understanding of the instructions.  Patient advised to call back or seek an in-person evaluation if the symptoms or condition worsens.  Duration of encounter: 69minutes.  Note by: Gillis Santa, MD Date: 04/24/2021; Time: 3:30 PM

## 2021-04-24 NOTE — Progress Notes (Signed)
Safety precautions to be maintained throughout the outpatient stay will include: orient to surroundings, keep bed in low position, maintain call bell within reach at all times, provide assistance with transfer out of bed and ambulation.   Nursing Pain Medication Assessment:  Safety precautions to be maintained throughout the outpatient stay will include: orient to surroundings, keep bed in low position, maintain call bell within reach at all times, provide assistance with transfer out of bed and ambulation.  Medication Inspection Compliance: Pill count conducted under aseptic conditions, in front of the patient. Neither the pills nor the bottle was removed from the patient's sight at any time. Once count was completed pills were immediately returned to the patient in their original bottle.  Medication: Oxycodone/APAP Pill/Patch Count:  52 of 120 pills remain Pill/Patch Appearance: Markings consistent with prescribed medication Bottle Appearance: Standard pharmacy container. Clearly labeled. Filled Date: 1 / 20 / 2023 Last Medication intake:  Today

## 2021-04-25 ENCOUNTER — Ambulatory Visit: Payer: PPO | Admitting: Student in an Organized Health Care Education/Training Program

## 2021-04-25 ENCOUNTER — Telehealth: Payer: Self-pay

## 2021-04-25 NOTE — Telephone Encounter (Signed)
Call to patient regarding missed appointment. Patient states she was unable to make it due to financial reasons and called clinic x3 to reschedule with no answer. Stated to patient we will call her soon to reschedule her.   Al Decant, RN

## 2021-04-27 LAB — TOXASSURE SELECT 13 (MW), URINE

## 2021-05-09 ENCOUNTER — Other Ambulatory Visit: Payer: Self-pay

## 2021-05-09 ENCOUNTER — Ambulatory Visit
Admission: RE | Admit: 2021-05-09 | Discharge: 2021-05-09 | Disposition: A | Discharge status: Home / Self Care | Payer: PPO | Source: Ambulatory Visit | Attending: Student in an Organized Health Care Education/Training Program | Admitting: Student in an Organized Health Care Education/Training Program

## 2021-05-09 ENCOUNTER — Encounter: Payer: Self-pay | Admitting: Student in an Organized Health Care Education/Training Program

## 2021-05-09 ENCOUNTER — Ambulatory Visit (HOSPITAL_BASED_OUTPATIENT_CLINIC_OR_DEPARTMENT_OTHER): Payer: PPO | Admitting: Student in an Organized Health Care Education/Training Program

## 2021-05-09 VITALS — BP 130/53 | HR 68 | Temp 96.8°F | Resp 14 | Ht 68.0 in | Wt 240.0 lb

## 2021-05-09 DIAGNOSIS — M25511 Pain in right shoulder: Secondary | ICD-10-CM | POA: Diagnosis not present

## 2021-05-09 DIAGNOSIS — M19012 Primary osteoarthritis, left shoulder: Secondary | ICD-10-CM

## 2021-05-09 DIAGNOSIS — M25512 Pain in left shoulder: Secondary | ICD-10-CM

## 2021-05-09 DIAGNOSIS — G8929 Other chronic pain: Secondary | ICD-10-CM

## 2021-05-09 DIAGNOSIS — M19011 Primary osteoarthritis, right shoulder: Secondary | ICD-10-CM | POA: Insufficient documentation

## 2021-05-09 MED ORDER — LIDOCAINE HCL 2 % IJ SOLN
INTRAMUSCULAR | Status: AC
  Filled 2021-05-09: qty 20

## 2021-05-09 MED ORDER — LIDOCAINE HCL 2 % IJ SOLN
20.0000 mL | Freq: Once | INTRAMUSCULAR | Status: AC
  Administered 2021-05-09: 400 mg

## 2021-05-09 MED ORDER — ROPIVACAINE HCL 2 MG/ML IJ SOLN
INTRAMUSCULAR | Status: AC
  Filled 2021-05-09: qty 20

## 2021-05-09 MED ORDER — ROPIVACAINE HCL 2 MG/ML IJ SOLN
9.0000 mL | Freq: Once | INTRAMUSCULAR | Status: AC
  Administered 2021-05-09: 20 mL

## 2021-05-09 MED ORDER — IOHEXOL 180 MG/ML  SOLN
10.0000 mL | Freq: Once | INTRAMUSCULAR | Status: AC
  Administered 2021-05-09: 5 mL via INTRA_ARTICULAR

## 2021-05-09 MED ORDER — METHYLPREDNISOLONE ACETATE 80 MG/ML IJ SUSP
80.0000 mg | Freq: Once | INTRAMUSCULAR | Status: AC
  Administered 2021-05-09: 80 mg
  Filled 2021-05-09: qty 1

## 2021-05-09 NOTE — Progress Notes (Signed)
PROVIDER NOTE: Interpretation of information contained herein should be left to medically-trained personnel. Specific patient instructions are provided elsewhere under "Patient Instructions" section of medical record. This document was created in part using STT-dictation technology, any transcriptional errors that may result from this process are unintentional.  Patient: Lauren Greene Type: Established DOB: 1966-02-24 MRN: [id number] PCP: Babs Sciara, MD  Service: Procedure DOS: 05/09/2021 Setting: Ambulatory Location: Ambulatory outpatient facility Delivery: Face-to-face Provider: Edward Jolly, MD Specialty: Interventional Pain Management Specialty designation: 09 Location: Outpatient facility Ref. Prov.: Babs Sciara, MD    Primary Reason for Visit: Interventional Pain Management Treatment. CC: Shoulder Pain (both)    Procedure:          Anesthesia, Analgesia, Anxiolysis:  Type: Diagnostic Glenohumeral Joint (shoulder) Injection #1  Primary Purpose: Diagnostic Region: Posterolateral Shoulder Area Level:  Shoulder Target Area: Glenohumeral Joint (shoulder) Approach: Posterior approach. Laterality: Bilateral  Anesthesia: Local (1-2% Lidocaine)  Anxiolysis: None  Sedation: None  Guidance: Fluoroscopy           Position: Prone   1. Localized osteoarthritis of shoulder regions, bilateral   2. Chronic pain of both shoulders    NAS-11 Pain score:   Pre-procedure: 8 /10   Post-procedure:  (left 0, right 5)/10      Pre-op H&P Assessment:  Lauren Greene is a 56 y.o. (year old), female patient, seen today for interventional treatment. She  has a past surgical history that includes Total knee arthroplasty (Bilateral); Heel spur surgery; Back surgery; IM nailing femoral shaft fracture (Left, 08/27/2012); Femur IM nail (Left, 08/27/2012); Cervical biopsy w/ loop electrode excision (1990's); Endometrial biopsy (04-05-05 and 02-12-06); Tubal ligation; Shoulder surgery (Left);  Vaginal hysterectomy (2018); Foot arthrodesis (Right, 02/01/2020); Achilles tendon lengthening (Right, 02/01/2020); Knee surgery; Bunionectomy; and Spinal cord stimulator insertion (N/A, 02/26/2021). [name] has a current medication list which includes the following prescription(s): humira pen, amlodipine, amphetamine-dextroamphetamine, amphetamine-dextroamphetamine, amphetamine-dextroamphetamine, diclofenac sodium cr, duloxetine, lidocaine, methocarbamol, nystatin, omeprazole, oxycodone-acetaminophen, [START ON 07/05/2021] oxycodone-acetaminophen, trazodone, and [START ON 06/05/2021] oxycodone-acetaminophen. Her primarily concern today is the Shoulder Pain (both)  Initial Vital Signs:  Pulse/HCG Rate: 68ECG Heart Rate: 80 Temp: (!) 96.8 F (36 C) Resp: 13 BP: (!) 147/94 SpO2: 98 %  BMI: Estimated body mass index is 36.49 kg/m as calculated from the following:   Height as of this encounter: 5\' 8"  (1.727 m).   Weight as of this encounter: 240 lb (108.9 kg).  Risk Assessment: Allergies: Reviewed. She is allergic to wellbutrin [bupropion] and lyrica [pregabalin].  Allergy Precautions: None required Coagulopathies: Reviewed. None identified.  Blood-thinner therapy: None at this time Active Infection(s): Reviewed. None identified. Lauren Greene is afebrile  Site Confirmation: Lauren Greene was asked to confirm the procedure and laterality before marking the site Procedure checklist: Completed Consent: Before the procedure and under the influence of no sedative(s), amnesic(s), or anxiolytics, the patient was informed of the treatment options, risks and possible complications. To fulfill our ethical and legal obligations, as recommended by the American Medical Association's Code of Ethics, I have informed the patient of my clinical impression; the nature and purpose of the treatment or procedure; the risks, benefits, and possible complications of the intervention; the alternatives, including doing  nothing; the risk(s) and benefit(s) of the alternative treatment(s) or procedure(s); and the risk(s) and benefit(s) of doing nothing. The patient was provided information about the general risks and possible complications associated with the procedure. These may include, but are not limited to: failure to achieve desired goals,  infection, bleeding, organ or nerve damage, allergic reactions, paralysis, and death. In addition, the patient was informed of those risks and complications associated to the procedure, such as failure to decrease pain; infection; bleeding; organ or nerve damage with subsequent damage to sensory, motor, and/or autonomic systems, resulting in permanent pain, numbness, and/or weakness of one or several areas of the body; allergic reactions; (i.e.: anaphylactic reaction); and/or death. Furthermore, the patient was informed of those risks and complications associated with the medications. These include, but are not limited to: allergic reactions (i.e.: anaphylactic or anaphylactoid reaction(s)); adrenal axis suppression; blood sugar elevation that in diabetics may result in ketoacidosis or comma; water retention that in patients with history of congestive heart failure may result in shortness of breath, pulmonary edema, and decompensation with resultant heart failure; weight gain; swelling or edema; medication-induced neural toxicity; particulate matter embolism and blood vessel occlusion with resultant organ, and/or nervous system infarction; and/or aseptic necrosis of one or more joints. Finally, the patient was informed that Medicine is not an exact science; therefore, there is also the possibility of unforeseen or unpredictable risks and/or possible complications that may result in a catastrophic outcome. The patient indicated having understood very clearly. We have given the patient no guarantees and we have made no promises. Enough time was given to the patient to ask questions, all of  which were answered to the patient's satisfaction. Lauren Greene has indicated that she wanted to continue with the procedure. Attestation: I, the ordering provider, attest that I have discussed with the patient the benefits, risks, side-effects, alternatives, likelihood of achieving goals, and potential problems during recovery for the procedure that I have provided informed consent. Date   Time: 05/09/2021  2:12 PM  Pre-Procedure Preparation:  Monitoring: As per clinic protocol. Respiration, ETCO2, SpO2, BP, heart rate and rhythm monitor placed and checked for adequate function Safety Precautions: Patient was assessed for positional comfort and pressure points before starting the procedure. Time-out: I initiated and conducted the "Time-out" before starting the procedure, as per protocol. The patient was asked to participate by confirming the accuracy of the "Time Out" information. Verification of the correct person, site, and procedure were performed and confirmed by me, the nursing staff, and the patient. "Time-out" conducted as per Joint Commission's Universal Protocol (UP.01.01.01). Time: 1436  Description of Procedure:          Area Prepped: Entire shoulder Area DuraPrep (Iodine Povacrylex [0.7% available iodine] and Isopropyl Alcohol, 74% w/w) Safety Precautions: Aspiration looking for blood return was conducted prior to all injections. At no point did we inject any substances, as a needle was being advanced. No attempts were made at seeking any paresthesias. Safe injection practices and needle disposal techniques used. Medications properly checked for expiration dates. SDV (single dose vial) medications used. Description of the Procedure: Protocol guidelines were followed. The patient was placed in position over the procedure table. The target area was identified and the area prepped in the usual manner. Skin & deeper tissues infiltrated with local anesthetic. Appropriate amount of time allowed  to pass for local anesthetics to take effect. The procedure needles were then advanced to the target area. Proper needle placement secured. Negative aspiration confirmed. Solution injected in intermittent fashion, asking for systemic symptoms every 0.5cc of injectate. The needles were then removed and the area cleansed, making sure to leave some of the prepping solution back to take advantage of its long term bactericidal properties.         Vitals:  05/09/21 1416 05/09/21 1440 05/09/21 1444  BP: (!) 147/94 (!) 112/59 (!) 130/53  Pulse: 68    Resp:  13 14  Temp: (!) 96.8 F (36 C)    SpO2: 98% 95% 95%  Weight: 240 lb (108.9 kg)    Height: 5\' 8"  (1.727 m)      Start Time: 1437 hrs. End Time: 1443 hrs. Materials:  Needle(s) Type: Spinal Needle Gauge: 25G Length: 3.5-in Medication(s): Please see orders for medications and dosing details. 10 cc solution made of 9 cc of 0.2% ropivacaine, 1 cc of methylprednisolone, 80 mg/cc.  5 cc injected into each shoulder after contrast confirmation. Imaging Guidance (Non-Spinal):          Type of Imaging Technique: Fluoroscopy Guidance (Non-Spinal) Indication(s): Assistance in needle guidance and placement for procedures requiring needle placement in or near specific anatomical locations not easily accessible without such assistance. Exposure Time: Please see nurses notes. Contrast: Before injecting any contrast, we confirmed that the patient did not have an allergy to iodine, shellfish, or radiological contrast. Once satisfactory needle placement was completed at the desired level, radiological contrast was injected. Contrast injected under live fluoroscopy. No contrast complications. See chart for type and volume of contrast used. Fluoroscopic Guidance: I was personally present during the use of fluoroscopy. "Tunnel Vision Technique" used to obtain the best possible view of the target area. Parallax error corrected before commencing the procedure.  "Direction-depth-direction" technique used to introduce the needle under continuous pulsed fluoroscopy. Once target was reached, antero-posterior, oblique, and lateral fluoroscopic projection used confirm needle placement in all planes. Images permanently stored in EMR. Interpretation: I personally interpreted the imaging intraoperatively. Adequate needle placement confirmed in multiple planes. Appropriate spread of contrast into desired area was observed. No evidence of afferent or efferent intravascular uptake. Permanent images saved into the patient's record.  Post-operative Assessment:  Post-procedure Vital Signs:  Pulse/HCG Rate: 6879 Temp:  (!) 96.8 F (36 C) Resp: 14 BP:  (!) 130/53 SpO2: 95 %  EBL: None  Complications: No immediate post-treatment complications observed by team, or reported by patient.  Note: The patient tolerated the entire procedure well. A repeat set of vitals were taken after the procedure and the patient was kept under observation following institutional policy, for this type of procedure. Post-procedural neurological assessment was performed, showing return to baseline, prior to discharge. The patient was provided with post-procedure discharge instructions, including a section on how to identify potential problems. Should any problems arise concerning this procedure, the patient was given instructions to immediately contact , at any time, without hesitation. In any case, we plan to contact the patient by telephone for a follow-up status report regarding this interventional procedure.  Comments:  No additional relevant information.  Plan of Care  Orders:  Orders Placed This Encounter  Procedures   DG PAIN CLINIC C-ARM 1-60 MIN NO REPORT    Intraoperative interpretation by procedural physician at [facility].    Standing Status:   Standing    Number of Occurrences:   1    Order Specific Question:   Reason for exam:    Answer:   Assistance in  needle guidance and placement for procedures requiring needle placement in or near specific anatomical locations not easily accessible without such assistance.   Chronic Opioid Analgesic:  Percocet 10 mg 4 times daily as needed, quantity 120/month MME equals 60   Medications ordered for procedure: Meds ordered this encounter  Medications   iohexol (OMNIPAQUE) 180 MG/ML injection  10 mL    Must be Myelogram-compatible. If not available, you may substitute with a water-soluble, non-ionic, hypoallergenic, myelogram-compatible radiological contrast medium.   lidocaine (XYLOCAINE) 2 % (with pres) injection 400 mg   methylPREDNISolone acetate (DEPO-MEDROL) injection 80 mg   ropivacaine (PF) 2 mg/mL (0.2%) (NAROPIN) injection 9 mL   Medications administered: We administered iohexol, lidocaine, methylPREDNISolone acetate, and ropivacaine (PF) 2 mg/mL (0.2%).  See the medical record for exact dosing, route, and time of administration.  Follow-up plan:   Return in about 4 weeks (around 06/06/2021) for Post Procedure Evaluation, virtual.      Recent Visits Date Type Provider Dept  04/24/21 Office Visit Edward JollyLateef, Delani Kohli, MD [facility]  Showing recent visits within past 90 days and meeting all other requirements Today's Visits Date Type Provider Dept  05/09/21 Procedure visit Edward JollyLateef, Nakaila Freeze, MD [facility]  Showing today's visits and meeting all other requirements Future Appointments Date Type Provider Dept  06/06/21 Appointment Edward JollyLateef, Mariela Rex, MD [facility]  07/17/21 Appointment Edward JollyLateef, Shawnita Krizek, MD [facility]  Showing future appointments within next 90 days and meeting all other requirements  Disposition: Discharge home  Discharge (Date   Time): 05/09/2021; 1450 hrs.   Primary Care Physician: Babs SciaraLuking, Scott A, MD [facility] Note by: Edward JollyBilal Lonia Roane, MD Date: 05/09/2021; Time: 2:55 PM  Disclaimer:  Medicine is not an  exact science. The only guarantee in medicine is that nothing is guaranteed. It is important to note that the decision to proceed with this intervention was based on the information collected from the patient. The Data and conclusions were drawn from the patient's questionnaire, the interview, and the physical examination. Because the information was provided in large part by the patient, it cannot be guaranteed that it has not been purposely or unconsciously manipulated. Every effort has been made to obtain as much relevant data as possible for this evaluation. It is important to note that the conclusions that lead to this procedure are derived in large part from the available data. Always take into account that the treatment will also be dependent on availability of resources and existing treatment guidelines, considered by other Pain Management Practitioners as being common knowledge and practice, at the time of the intervention. For Medico-Legal purposes, it is also important to point out that variation in procedural techniques and pharmacological choices are the acceptable norm. The indications, contraindications, technique, and results of the above procedure should only be interpreted and judged by a Board-Certified Interventional Pain Specialist with extensive familiarity and expertise in the same exact procedure and technique.

## 2021-05-09 NOTE — Patient Instructions (Signed)

## 2021-05-10 ENCOUNTER — Telehealth: Payer: Self-pay

## 2021-05-10 NOTE — Telephone Encounter (Signed)
Post procedure phone call.  LM 

## 2021-06-04 ENCOUNTER — Other Ambulatory Visit: Payer: Self-pay | Admitting: Family Medicine

## 2021-06-05 ENCOUNTER — Encounter: Payer: Self-pay | Admitting: Student in an Organized Health Care Education/Training Program

## 2021-06-05 ENCOUNTER — Encounter: Payer: Self-pay | Admitting: Family Medicine

## 2021-06-05 ENCOUNTER — Ambulatory Visit (INDEPENDENT_AMBULATORY_CARE_PROVIDER_SITE_OTHER): Payer: PPO | Admitting: Family Medicine

## 2021-06-05 ENCOUNTER — Other Ambulatory Visit: Payer: Self-pay

## 2021-06-05 VITALS — BP 126/78 | HR 69 | Temp 98.2°F | Ht 68.0 in | Wt 249.0 lb

## 2021-06-05 DIAGNOSIS — R590 Localized enlarged lymph nodes: Secondary | ICD-10-CM | POA: Diagnosis not present

## 2021-06-05 DIAGNOSIS — R221 Localized swelling, mass and lump, neck: Secondary | ICD-10-CM | POA: Diagnosis not present

## 2021-06-05 MED ORDER — SILVER SULFADIAZINE 1 % EX CREA: TOPICAL_CREAM | CUTANEOUS | 2 refills | Status: AC

## 2021-06-05 MED ORDER — LISDEXAMFETAMINE DIMESYLATE 60 MG PO CAPS: 60.0000 mg | ORAL_CAPSULE | ORAL | 0 refills | Status: DC

## 2021-06-05 NOTE — Progress Notes (Signed)
? ?  Subjective:  ? ? Patient ID: Lauren Greene, female    DOB: November 03, 1965, 56 y.o.   MRN: [id number] ? ?HPI ?This patient has adult ADD. Takes medication responsibly. Medication does help the patient focus in be more functional. Patient relates that they are or not abusing the medication or misusing the medication. The patient understands that if they're having any negative side effects such as elevated high blood pressure severe headaches they would need stop the medication follow-up immediately. They also understand that the prescriptions are to last for 3 months then the patient will need to follow-up before having further prescriptions. ? ?Patient compliance 2 QD ? ?Does medication help patient function /attention better  yes some ? 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  A lot of regulations.  It is difficult for her to get it.  She is requesting a different medication unfortunately we cannot tell which medicines are approved by her insurance ? ?She also noticed a lump on the right side of her neck she denies fever chills sweats nausea vomiting she denies any severe headaches chest pain shortness of breath has never noticed this before ? ?Review of Systems ? ?   ?Objective:  ? Physical Exam ? ?General-in no acute distress ?Eyes-no discharge ?Lungs-respiratory rate normal, CTA ?CV-no murmurs,RRR ?Extremities skin warm dry no edema ?Neuro grossly normal ?Behavior normal, alert ?Nodule lymph node in the right anterior cervical region approximately 2 cm x 1-1/2 cm ?No masses felt orally no sign of tooth abscess ?No supraclavicular masses felt ?   ?Assessment & Plan:  ?Adderall in short supply ?Try Vyvanse may need to do prior approval ?Adult ADD does well on medications needs medication continue medication ? ? ?Mass felt in the right side of the neck patient would benefit from CT scan with contrast to help delineate what this growth is. ? ?

## 2021-06-06 ENCOUNTER — Ambulatory Visit
Payer: PPO | Attending: Student in an Organized Health Care Education/Training Program | Admitting: Student in an Organized Health Care Education/Training Program

## 2021-06-06 ENCOUNTER — Encounter: Payer: Self-pay | Admitting: Student in an Organized Health Care Education/Training Program

## 2021-06-06 DIAGNOSIS — M25511 Pain in right shoulder: Secondary | ICD-10-CM | POA: Diagnosis not present

## 2021-06-06 DIAGNOSIS — M25512 Pain in left shoulder: Secondary | ICD-10-CM

## 2021-06-06 DIAGNOSIS — G894 Chronic pain syndrome: Secondary | ICD-10-CM

## 2021-06-06 DIAGNOSIS — M19011 Primary osteoarthritis, right shoulder: Secondary | ICD-10-CM | POA: Diagnosis not present

## 2021-06-06 DIAGNOSIS — G8929 Other chronic pain: Secondary | ICD-10-CM | POA: Diagnosis not present

## 2021-06-06 DIAGNOSIS — M19012 Primary osteoarthritis, left shoulder: Secondary | ICD-10-CM | POA: Diagnosis not present

## 2021-06-06 NOTE — Progress Notes (Signed)
Patient: Lauren Greene  Service Category: E/M  Provider: Edward Jolly, MD  ?DOB: Mar 12, 1966  DOS: 06/06/2021  Location: Office  ?MRN: [id number]  Setting: Ambulatory outpatient  Referring Provider: Babs Sciara, MD  ?Type: Established Patient  Specialty: Interventional Pain Management  PCP: Babs Sciara, MD  ?Location: Remote location  Delivery: TeleHealth    ? ?Virtual Encounter - Pain Management ?PROVIDER NOTE: Information contained herein reflects review and annotations entered in association with encounter. Interpretation of such information and data should be left to medically-trained personnel. Information provided to patient can be located elsewhere in the medical record under "Patient Instructions". Document created using STT-dictation technology, any transcriptional errors that may result from process are unintentional.  ?  ?Contact & Pharmacy ?Preferred: [phone] ?Home: [phone] (home) ?Mobile: [phone] (mobile) ?E-mail: [email or web address]  ? [facility] - [place] [place] - [address] ?[address] ?[place]  [address] ?Phone: [phone] Fax: [phone] ?  ?Pre-screening  ?Lauren Greene offered "in-person" vs "virtual" encounter. She indicated preferring virtual for this encounter.  ? ?Reason ?COVID-19*  Social distancing based on CDC and AMA recommendations.  ? ?I contacted Melburn Hake on 06/06/2021 via telephone.      I clearly identified myself as Edward Jolly, MD. I verified that I was speaking with the correct person using two identifiers (Name: Lauren Greene, and date of birth: 05/17/1965). ? ?Consent ?I sought verbal advanced consent from Melburn Hake for virtual visit interactions. I informed Lauren Greene of possible security and privacy concerns, risks, and limitations associated with providing "not-in-person" medical evaluation and management services. I also informed Lauren Greene of the availability of "in-person" appointments. Finally, I informed  her that there would be a charge for the virtual visit and that she could be  personally, fully or partially, financially responsible for it. Ms. Mcguffin expressed understanding and agreed to proceed.  ? ?Historic Elements   ?Lauren Greene is a 56 y.o. year old, female patient evaluated today after our last contact on 05/09/2021. Ms. Ingman  has a past medical history of ADHD (attention deficit hyperactivity disorder), Ankylosing spondylitis (HCC), Arthritis, Back pain, Broken femur (HCC) (08/27/2012), Chronic pain, GERD (gastroesophageal reflux disease), Heart murmur, History of methicillin resistant staphylococcus aureus (MRSA) (2009), Hypertension, Migraine, Osteoarthritis, PVC (premature ventricular contraction), Raynaud disease, and Raynaud phenomenon (04/06/2018). She also  has a past surgical history that includes Total knee arthroplasty (Bilateral); Heel spur surgery; Back surgery; IM nailing femoral shaft fracture (Left, 08/27/2012); Femur IM nail (Left, 08/27/2012); Cervical biopsy w/ loop electrode excision (1990's); Endometrial biopsy (04-05-05 and 02-12-06); Tubal ligation; Shoulder surgery (Left); Vaginal hysterectomy (2018); Foot arthrodesis (Right, 02/01/2020); Achilles tendon lengthening (Right, 02/01/2020); Knee surgery; Bunionectomy; and Spinal cord stimulator insertion (N/A, 02/26/2021). Ms. Kirn has a current medication list which includes the following prescription(s): humira pen, amlodipine, amphetamine-dextroamphetamine, amphetamine-dextroamphetamine, amphetamine-dextroamphetamine, diclofenac sodium cr, duloxetine, lidocaine, methocarbamol, nystatin, omeprazole, oxycodone-acetaminophen, [START ON 07/05/2021] oxycodone-acetaminophen, trazodone, lisdexamfetamine, and silver sulfadiazine. She  reports that she quit smoking about 20 years ago. Her smoking use included cigarettes. She has a 20.00 pack-year smoking history. She has never used smokeless tobacco. She reports current  alcohol use. She reports that she does not use drugs. Lauren Greene is allergic to wellbutrin [bupropion] and lyrica [pregabalin].  ? ?HPI  ?Today, she is being contacted for a post-procedure assessment. ? ? ?Post-procedure evaluation  ?   ?Procedure:          Anesthesia, Analgesia, Anxiolysis:  ?Type: Diagnostic Glenohumeral Joint (  shoulder) Injection #1  ?Primary Purpose: Diagnostic ?Region: Posterolateral Shoulder Area ?Level:  Shoulder ?Target Area: Glenohumeral Joint (shoulder) ?Approach: Posterior approach. ?Laterality: Bilateral  Anesthesia: Local (1-2% Lidocaine)  ?Anxiolysis: None  ?Sedation: None  ?Guidance: Fluoroscopy         ? ? ?Position: Prone  ? ?1. Localized osteoarthritis of shoulder regions, bilateral   ?2. Chronic pain of both shoulders   ? ?NAS-11 Pain score:  ? Pre-procedure: 8 /10  ? Post-procedure:  (left 0, right 5)/10  ? ?   ?Effectiveness:  ?Initial hour after procedure: 50 %  ?Subsequent 4-6 hours post-procedure: 50 %  ?Analgesia past initial 6 hours: 30 %  ?Ongoing improvement:  ?Analgesic:  30% ?Function: Lauren Greene reports improvement in function ?ROM: Lauren Greene reports improvement in ROM ? ? ?Pharmacotherapy Assessment  ? ?Opioid Analgesic: Percocet 10 mg 4 times daily as needed, quantity 120/month MME equals 60  ? ?Monitoring: ?[facility] PMP: PDMP reviewed during this encounter.       ?Pharmacotherapy: No side-effects or adverse reactions reported. ?Compliance: No problems identified. ?Effectiveness: Clinically acceptable. ?Plan: Refer to "POC". UDS:  ?Summary  ?Date Value Ref Range Status  ?04/24/2021 Note  Final  ?  Comment:  ?  ==================================================================== ?[employer] Select 13 ([facility]) ?==================================================================== ?Test                             Result       Flag       Units ? ?Drug Present and Declared for Prescription Verification ?  Amphetamine                    >9615        EXPECTED   ng/mg creat ?    Amphetamine is available as a schedule II prescription drug. ? ?  Oxycodone                      2475         EXPECTED   ng/mg creat ?  Oxymorphone                    882          EXPECTED   ng/mg creat ?  Noroxycodone                   4996         EXPECTED   ng/mg creat ?  Noroxymorphone                 161          EXPECTED   ng/mg creat ?   Sources of oxycodone are scheduled prescription medications. ?   Oxymorphone, noroxycodone, and noroxymorphone are expected ?   metabolites of oxycodone. Oxymorphone is also available as a ?   scheduled prescription medication. ? ?==================================================================== ?Test                      Result    Flag   Units      Ref Range ?  Creatinine              104              mg/dL      >=34 ?==================================================================== ?Declared Medications: ? The flagging and interpretation on this report are based on the ? following declared medications.  Unexpected results may arise from ?  inaccuracies in the declared medications. ? ? **Note: The testing scope of this panel includes these medications: ? ? Amphetamine (Adderall) ? Oxycodone (Percocet) ? ? **Note: The testing scope of this panel does not include the ? following reported medications: ? ? Acetaminophen (Percocet) ? Adalimumab (Humira) ? Amlodipine (Norvasc) ? Diclofenac ? Duloxetine (Cymbalta) ? Lidocaine (Xylocaine) ? Methocarbamol (Robaxin) ? Nystatin ? Omeprazole (Prilosec) ? Trazodone (Desyrel) ?==================================================================== ?For clinical consultation, please call [phone]. ?==================================================================== ?  ?  ? ?Laboratory Chemistry Profile  ? ?Renal ?Lab Results  ?Component Value Date  ? BUN 18 02/26/2021  ? CREATININE 0.53 02/26/2021  ? BCR 23 11/09/2020  ? GFRAA 115 08/30/2019  ? GFRNONAA >60 02/26/2021  ?  Hepatic ?Lab Results  ?Component Value Date  ? AST 18  09/13/2020  ? ALT 21 09/13/2020  ? ALBUMIN 4.4 09/13/2020  ? ALKPHOS 123 (H) 09/13/2020  ? LIPASE 25 01/12/2016  ?  ?Electrolytes ?Lab Results  ?Component Value Date  ? NA 137 02/26/2021  ? K 3.9 02/26/2021  ?

## 2021-06-25 ENCOUNTER — Other Ambulatory Visit: Payer: Self-pay | Admitting: Family Medicine

## 2021-06-26 ENCOUNTER — Ambulatory Visit (HOSPITAL_COMMUNITY): Admission: RE | Admit: 2021-06-26 | Payer: PPO | Source: Ambulatory Visit

## 2021-06-26 ENCOUNTER — Encounter (HOSPITAL_COMMUNITY): Payer: Self-pay

## 2021-06-27 ENCOUNTER — Ambulatory Visit (HOSPITAL_COMMUNITY)
Admission: RE | Admit: 2021-06-27 | Discharge: 2021-06-27 | Disposition: A | Discharge status: Home / Self Care | Payer: PPO | Source: Ambulatory Visit | Attending: Family Medicine | Admitting: Family Medicine

## 2021-06-27 DIAGNOSIS — R221 Localized swelling, mass and lump, neck: Secondary | ICD-10-CM | POA: Insufficient documentation

## 2021-06-27 DIAGNOSIS — R590 Localized enlarged lymph nodes: Secondary | ICD-10-CM | POA: Insufficient documentation

## 2021-06-27 MED ORDER — IOHEXOL 300 MG/ML  SOLN
100.0000 mL | Freq: Once | INTRAMUSCULAR | Status: AC | PRN
  Administered 2021-06-27: 75 mL via INTRAVENOUS

## 2021-07-02 ENCOUNTER — Other Ambulatory Visit: Payer: Self-pay

## 2021-07-02 ENCOUNTER — Telehealth: Payer: Self-pay

## 2021-07-02 DIAGNOSIS — E7849 Other hyperlipidemia: Secondary | ICD-10-CM

## 2021-07-02 DIAGNOSIS — M1990 Unspecified osteoarthritis, unspecified site: Secondary | ICD-10-CM

## 2021-07-02 DIAGNOSIS — I6529 Occlusion and stenosis of unspecified carotid artery: Secondary | ICD-10-CM

## 2021-07-02 NOTE — Telephone Encounter (Signed)
Nurses-please inform her that we can do rheumatology referral but rheumatology is in [place]. ?May proceed forward with rheumatology referral thank you ?

## 2021-07-03 ENCOUNTER — Other Ambulatory Visit: Payer: Self-pay

## 2021-07-03 DIAGNOSIS — E7849 Other hyperlipidemia: Secondary | ICD-10-CM

## 2021-07-03 DIAGNOSIS — E785 Hyperlipidemia, unspecified: Secondary | ICD-10-CM

## 2021-07-03 DIAGNOSIS — I10 Essential (primary) hypertension: Secondary | ICD-10-CM

## 2021-07-03 MED ORDER — ROSUVASTATIN CALCIUM 10 MG PO TABS: 10.0000 mg | ORAL_TABLET | Freq: Every day | ORAL | 5 refills | Status: DC

## 2021-07-03 NOTE — Telephone Encounter (Signed)
Patient informed that referral will be done.  Patient states she wants to go to [facility].  Referral generated ?

## 2021-07-05 LAB — POCT I-STAT CREATININE: Creatinine, Ser: 0.8 mg/dL (ref 0.44–1.00)

## 2021-07-10 ENCOUNTER — Encounter: Payer: PPO | Admitting: Student in an Organized Health Care Education/Training Program

## 2021-07-13 ENCOUNTER — Ambulatory Visit (HOSPITAL_COMMUNITY): Payer: PPO

## 2021-07-17 ENCOUNTER — Telehealth: Payer: Self-pay | Admitting: Student in an Organized Health Care Education/Training Program

## 2021-07-17 ENCOUNTER — Encounter: Payer: Self-pay | Admitting: Student in an Organized Health Care Education/Training Program

## 2021-07-17 ENCOUNTER — Ambulatory Visit
Payer: PPO | Attending: Student in an Organized Health Care Education/Training Program | Admitting: Student in an Organized Health Care Education/Training Program

## 2021-07-17 VITALS — BP 146/76 | HR 63 | Resp 18 | Ht 68.0 in | Wt 250.0 lb

## 2021-07-17 DIAGNOSIS — G894 Chronic pain syndrome: Secondary | ICD-10-CM | POA: Diagnosis not present

## 2021-07-17 DIAGNOSIS — M5416 Radiculopathy, lumbar region: Secondary | ICD-10-CM | POA: Diagnosis not present

## 2021-07-17 DIAGNOSIS — M961 Postlaminectomy syndrome, not elsewhere classified: Secondary | ICD-10-CM | POA: Insufficient documentation

## 2021-07-17 DIAGNOSIS — M47816 Spondylosis without myelopathy or radiculopathy, lumbar region: Secondary | ICD-10-CM | POA: Insufficient documentation

## 2021-07-17 DIAGNOSIS — Z79891 Long term (current) use of opiate analgesic: Secondary | ICD-10-CM | POA: Diagnosis not present

## 2021-07-17 DIAGNOSIS — Z9889 Other specified postprocedural states: Secondary | ICD-10-CM | POA: Insufficient documentation

## 2021-07-17 DIAGNOSIS — G8929 Other chronic pain: Secondary | ICD-10-CM | POA: Diagnosis not present

## 2021-07-17 DIAGNOSIS — M5136 Other intervertebral disc degeneration, lumbar region: Secondary | ICD-10-CM | POA: Insufficient documentation

## 2021-07-17 DIAGNOSIS — M48062 Spinal stenosis, lumbar region with neurogenic claudication: Secondary | ICD-10-CM | POA: Diagnosis not present

## 2021-07-17 HISTORY — DX: Spinal stenosis, lumbar region with neurogenic claudication: M48.062

## 2021-07-17 MED ORDER — HYDROMORPHONE HCL 4 MG PO TABS: 4.0000 mg | ORAL_TABLET | Freq: Three times a day (TID) | ORAL | 0 refills | Status: AC | PRN

## 2021-07-17 NOTE — Telephone Encounter (Signed)
Pharmacy needs to verify a prescription that was just sent in.  ?

## 2021-07-17 NOTE — Progress Notes (Signed)
PROVIDER NOTE: Information contained herein reflects review and annotations entered in association with encounter. Interpretation of such information and data should be left to medically-trained personnel. Information provided to patient can be located elsewhere in the medical record under "Patient Instructions". Document created using STT-dictation technology, any transcriptional errors that may result from process are unintentional.  ?  ?Patient: Lauren Greene  Service Category: E/M  Provider: Gillis Santa, MD  ?DOB: 02/04/1966  DOS: 07/17/2021  Specialty: Interventional Pain Management  ?MRN: [id number]  Setting: Ambulatory outpatient  PCP: Kathyrn Drown, MD  ?Type: Established Patient    Referring Provider: Kathyrn Drown, MD  ?Location: Office  Delivery: Face-to-face    ? ?HPI  ?[name], a 56 y.o. year old female, is here today because of her Chronic pain syndrome [G89.4]. Lauren Greene primary complain today is Back Pain, Foot Pain (right), and Shoulder Pain ?Last encounter: My last encounter with her was on 06/06/21 ? 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 ?Pain Assessment: Severity of Chronic pain is reported as a 7 /10. Location: Back Lower/denies. Onset: More than a month ago. Quality: Lauren Greene, Aching. Timing: Constant. Modifying factor(s): meds. ?Vitals:  height is _0  (1.727 m) and weight is 250 lb (113.4 kg). Her blood pressure is 146/76 (abnormal) and her pulse is 63. Her respiration is 18 and oxygen saturation is 100%.  ? ?Reason for encounter: medication management.   ? ?[place] presents today for medication management.  She is status post [employer] spinal cord stimulator  implant with Dr. Lacinda Axon.  Unfortunately, she states that she is not experiencing the same pain relief as she was during her trial.  During her postoperative visit with Dr. Lacinda Axon, x-rays of her thoracic and lumbar spine were obtained which did not show any lead fracture or lead migration.  She states that she is having to charge the battery daily.  She is wondering if she should have it explanted.  She has not met with Quita Skye, [employer] spinal cord stimulator representative to optimize program settings.  I instructed her to consider this before we consider explant as the patient did bring this up today.  She states that she is not having any paresthesias from spinal cord stimulation in her feet which is where she needs them. ? ?She is very tearful today and states that she would like to change her oxycodone medication.  She states that it is not helpful.  She has tried and failed tramadol, hydrocodone in the past.  We discussed equivalent MME opioid rotation to hydromorphone which corresponds to 4 mg every 8 hours as needed for severe pain. ? ?Pharmacotherapy Assessment  ?Analgesic: Transition to hydromorphone 4 mg every 8 hours as needed   ? ?Monitoring: ?[place] PMP: PDMP reviewed during this encounter.       ?Pharmacotherapy: No side-effects or adverse reactions reported. ?Compliance: No problems identified. ?Effectiveness: Clinically acceptable. ? ?Dewayne Shorter, RN  07/17/2021  2:15 PM  Sign when Signing Visit ?Nursing Pain Medication Assessment:  ?Safety precautions to be maintained throughout the outpatient stay will include: orient to surroundings, keep bed in low position, maintain call bell within reach at all times, provide assistance with transfer out of bed and ambulation.  ?Medication Inspection Compliance: Pill count conducted under  aseptic conditions, in front of the patient. Neither the pills nor the bottle was removed from the patient's sight at any time. Once count was completed pills were immediately returned  to the patient in their original bottle. ? ?Medication: Oxycodone/APAP ?Pill/Patch Count:  0 of 120 pills remain ?Pill/Patch Appearance: Markings consistent with prescribed medication ?Bottle Appearance: Standard pharmacy container. Clearly labeled. ?Filled Date: 03 / 28 / 2023 ?Last Medication intake:  Today ?  UDS:  ?Summary  ?Date Value Ref Range Status  ?04/24/2021 Note  Final  ?  Comment:  ?  ==================================================================== ?[employer] Select 13 (MW) ?==================================================================== ?Test                             Result       Flag       Units ? ?Drug Present and Declared for Prescription Verification ?  Amphetamine                    >9615        EXPECTED   ng/mg creat ?   Amphetamine is available as a schedule II prescription drug. ? ?  Oxycodone                      2475         EXPECTED   ng/mg creat ?  Oxymorphone                    882          EXPECTED   ng/mg creat ?  Noroxycodone                   4996         EXPECTED   ng/mg creat ?  Noroxymorphone                 161          EXPECTED   ng/mg creat ?   Sources of oxycodone are scheduled prescription medications. ?   Oxymorphone, noroxycodone, and noroxymorphone are expected ?   metabolites of oxycodone. Oxymorphone is also available as a ?   scheduled prescription medication. ? ?==================================================================== ?Test                      Result    Flag   Units      Ref Range ?  Creatinine              104              mg/dL      >=20 ?==================================================================== ?Declared Medications: ? The flagging and interpretation on this report are based on the ? following declared medications.  Unexpected results may arise from ? inaccuracies in the declared medications. ? ? **Note: The testing scope of this panel includes these medications: ? ? Amphetamine (Adderall) ? Oxycodone (Percocet) ? ? **Note: The  testing scope of this panel does not include the ? following reported medications: ? ? Acetaminophen (Percocet) ? Adalimumab (Humira) ? Amlodipine (Norvasc) ? Diclofenac ? Duloxetine (Cymbalta) ? Lidocaine (Xylocaine) ? Methocarbamol (Robaxin) ? Nystatin ? Omeprazole (Prilosec) ? Trazodone (Desyrel) ?==================================================================== ?For clinical consultation, please call [phone]. ?==================================================================== ?  ?  ? ?ROS  ?Constitutional: Denies any fever or chills ?Gastrointestinal: No reported hemesis, hematochezia, vomiting, or acute GI distress ?Musculoskeletal:  Low back pain, right foot pain, right ankle  pain, bilateral shoulder pain ?Neurological: No reported episodes of acute onset apraxia, aphasia, dysarthria, agnosia, amnesia, paralysis, loss of coordination, or loss of consciousness ? ?Medication Review  ?Adalimumab, DULoxetine, Diclofenac Sodium CR, HYDROmorphone, amLODipine, amphetamine-dextroamphetamine, lidocaine, lisdexamfetamine, methocarbamol, nystatin, omeprazole, oxyCODONE-acetaminophen, rosuvastatin, silver sulfADIAZINE, and traZODone ? ?History Review  ?Allergy: Lauren Greene is allergic to wellbutrin [bupropion] and lyrica [pregabalin]. ?Drug: Lauren Greene  reports no history of drug use. ?Alcohol:  reports current alcohol use. ?Tobacco:  reports that she quit smoking about 20 years ago. Her smoking use included cigarettes. She has a 20.00 pack-year smoking history. She has never used smokeless tobacco. ?Social: Lauren Greene  reports that she quit smoking about 20 years ago. Her smoking use included cigarettes. She has a 20.00 pack-year smoking history. She has never used smokeless tobacco. She reports current alcohol use. She reports that she does not use drugs. ?Medical:  has a past medical history of ADHD (attention deficit hyperactivity disorder), Ankylosing spondylitis ([employer]), Arthritis, Back pain, Broken  femur ([place]) (08/27/2012), Chronic pain, GERD (gastroesophageal reflux disease), Heart murmur, History of methicillin resistant staphylococcus aureus (MRSA) (2009), Hypertension, Migraine, Osteoarthritis, PVC

## 2021-07-17 NOTE — Progress Notes (Signed)
Nursing Pain Medication Assessment:  ?Safety precautions to be maintained throughout the outpatient stay will include: orient to surroundings, keep bed in low position, maintain call bell within reach at all times, provide assistance with transfer out of bed and ambulation.  ?Medication Inspection Compliance: Pill count conducted under aseptic conditions, in front of the patient. Neither the pills nor the bottle was removed from the patient's sight at any time. Once count was completed pills were immediately returned to the patient in their original bottle. ? ?Medication: Oxycodone/APAP ?Pill/Patch Count:  0 of 120 pills remain ?Pill/Patch Appearance: Markings consistent with prescribed medication ?Bottle Appearance: Standard pharmacy container. Clearly labeled. ?Filled Date: 03 / 28 / 2023 ?Last Medication intake:  Today ?

## 2021-07-17 NOTE — Telephone Encounter (Signed)
Returned pharm call. And informed her that her medication was changed today to Dilaudid and will stop Hydrocodone 10/325  Pharm with understanding. ?

## 2021-07-19 ENCOUNTER — Other Ambulatory Visit: Payer: Self-pay | Admitting: Family Medicine

## 2021-07-23 ENCOUNTER — Ambulatory Visit (HOSPITAL_COMMUNITY)
Admission: RE | Admit: 2021-07-23 | Discharge: 2021-07-23 | Disposition: A | Discharge status: Home / Self Care | Payer: PPO | Source: Ambulatory Visit | Attending: Family Medicine | Admitting: Family Medicine

## 2021-07-23 DIAGNOSIS — I6523 Occlusion and stenosis of bilateral carotid arteries: Secondary | ICD-10-CM | POA: Diagnosis not present

## 2021-07-23 DIAGNOSIS — I6529 Occlusion and stenosis of unspecified carotid artery: Secondary | ICD-10-CM | POA: Insufficient documentation

## 2021-08-02 ENCOUNTER — Ambulatory Visit: Payer: PPO | Admitting: Family Medicine

## 2021-08-06 DIAGNOSIS — H04123 Dry eye syndrome of bilateral lacrimal glands: Secondary | ICD-10-CM | POA: Diagnosis not present

## 2021-08-06 DIAGNOSIS — H524 Presbyopia: Secondary | ICD-10-CM | POA: Diagnosis not present

## 2021-08-07 ENCOUNTER — Encounter: Payer: PPO | Admitting: Student in an Organized Health Care Education/Training Program

## 2021-08-15 ENCOUNTER — Encounter: Payer: PPO | Admitting: Student in an Organized Health Care Education/Training Program

## 2021-08-16 ENCOUNTER — Telehealth: Payer: PPO | Admitting: Student in an Organized Health Care Education/Training Program

## 2021-08-18 ENCOUNTER — Other Ambulatory Visit: Payer: Self-pay | Admitting: Student in an Organized Health Care Education/Training Program

## 2021-08-18 ENCOUNTER — Other Ambulatory Visit: Payer: Self-pay | Admitting: Family Medicine

## 2021-08-18 DIAGNOSIS — G894 Chronic pain syndrome: Secondary | ICD-10-CM

## 2021-08-18 DIAGNOSIS — Z79891 Long term (current) use of opiate analgesic: Secondary | ICD-10-CM

## 2021-08-18 DIAGNOSIS — G8929 Other chronic pain: Secondary | ICD-10-CM

## 2021-08-18 DIAGNOSIS — M48062 Spinal stenosis, lumbar region with neurogenic claudication: Secondary | ICD-10-CM

## 2021-08-20 ENCOUNTER — Other Ambulatory Visit: Payer: Self-pay | Admitting: Family Medicine

## 2021-08-20 ENCOUNTER — Encounter: Payer: Self-pay | Admitting: Family Medicine

## 2021-08-20 ENCOUNTER — Encounter: Payer: Self-pay | Admitting: Student in an Organized Health Care Education/Training Program

## 2021-08-20 DIAGNOSIS — F988 Other specified behavioral and emotional disorders with onset usually occurring in childhood and adolescence: Secondary | ICD-10-CM

## 2021-08-20 MED ORDER — AMPHETAMINE-DEXTROAMPHET ER 30 MG PO CP24: ORAL_CAPSULE | ORAL | 0 refills | Status: DC

## 2021-08-24 ENCOUNTER — Ambulatory Visit (INDEPENDENT_AMBULATORY_CARE_PROVIDER_SITE_OTHER): Payer: PPO

## 2021-08-24 ENCOUNTER — Encounter: Payer: Self-pay | Admitting: Internal Medicine

## 2021-08-24 ENCOUNTER — Ambulatory Visit (INDEPENDENT_AMBULATORY_CARE_PROVIDER_SITE_OTHER): Payer: PPO | Admitting: Internal Medicine

## 2021-08-24 VITALS — BP 134/79 | HR 62 | Resp 17 | Ht 66.0 in | Wt 255.0 lb

## 2021-08-24 DIAGNOSIS — Z79899 Other long term (current) drug therapy: Secondary | ICD-10-CM | POA: Diagnosis not present

## 2021-08-24 DIAGNOSIS — T691XXD Chilblains, subsequent encounter: Secondary | ICD-10-CM | POA: Diagnosis not present

## 2021-08-24 DIAGNOSIS — M450 Ankylosing spondylitis of multiple sites in spine: Secondary | ICD-10-CM

## 2021-08-24 DIAGNOSIS — M1990 Unspecified osteoarthritis, unspecified site: Secondary | ICD-10-CM

## 2021-08-24 DIAGNOSIS — M79642 Pain in left hand: Secondary | ICD-10-CM

## 2021-08-24 DIAGNOSIS — I7300 Raynaud's syndrome without gangrene: Secondary | ICD-10-CM

## 2021-08-24 DIAGNOSIS — M79641 Pain in right hand: Secondary | ICD-10-CM

## 2021-08-24 NOTE — Progress Notes (Signed)
Office Visit Note  Patient: Lauren Greene             Date of Birth: May 11, 1965           MRN: [id number]             PCP: Kathyrn Drown, MD Referring: Kathyrn Drown, MD Visit Date: 08/24/2021 Occupation: Retired Education administrator  Subjective:  New Patient (Initial Visit) (Transfer of care)   History of Present Illness: Lauren Greene is a 56 y.o. female here for osteoarthritis with chronic pain of multiple areas and history of ankylosing spondylitis. She was previously seen at [facility] most recently 01/2021 tried Humira last year. Some active peripheral arthritis was reported at those encounters, in addition to chronic back pain and some generalized pain. She did not see any significant improvement in symptoms when taking the Humira for 3 months continuously. She has had multiple orthopedic surgeries with ongoing extensive degenerative arthritis since her 61s. She is seeing neurosurgery for spinal stimulator placement.  She has prolonged stiffness lasting throughout the day although most intense at the start of each morning. She does not see a lot of visible joint swelling compared to amount of pain and stiffness.  Labs reviewed HLA-B27 pos  Activities of Daily Living:  Patient reports morning stiffness for 24 hours.   Patient Reports nocturnal pain.  Difficulty dressing/grooming: Denies Difficulty climbing stairs: Reports Difficulty getting out of chair: Denies Difficulty using hands for taps, buttons, cutlery, and/or writing: Reports  Review of Systems  Constitutional:  Positive for fatigue.  HENT:  Positive for mouth dryness.   Eyes:  Positive for dryness.  Respiratory:  Negative for shortness of breath.   Cardiovascular:  Positive for swelling in legs/feet.  Gastrointestinal:  Negative for constipation.  Endocrine: Positive for heat intolerance, excessive thirst and increased urination.  Genitourinary:  Negative for difficulty urinating.   Musculoskeletal:  Positive for joint pain, gait problem, joint pain, joint swelling, muscle weakness, morning stiffness and muscle tenderness.  Skin:  Negative for rash.  Allergic/Immunologic: Negative for susceptible to infections.  Neurological:  Positive for weakness.  Hematological:  Positive for bruising/bleeding tendency.  Psychiatric/Behavioral:  Positive for sleep disturbance.     PMFS History:  Patient Active Problem List   Diagnosis Date Noted   High risk medication use 08/24/2021   Spinal stenosis, lumbar region, with neurogenic claudication 07/17/2021   Localized osteoarthritis of shoulder regions, bilateral 04/24/2021   Chronic pain of both shoulders 04/24/2021   Failed back surgical syndrome 04/24/2021   S/P lumbar microdiscectomy (left L4/5 with micordissection) 04/24/2021   Ankylosing spondylitis ([place]) 01/08/2021   HLA B27 positive 01/08/2021   Primary osteoarthritis of right ankle 11/23/2019   Essential hypertension 08/30/2019   Chronic radicular lumbar pain 08/23/2019   Other hyperlipidemia 11/16/2018   Foot pain, bilateral 06/04/2018   Raynaud phenomenon 04/06/2018   Overactive bladder 04/06/2018   Cellulitis of toe, right 04/01/2018   Chilblains 04/01/2018   Encounter for long-term opiate analgesic use 03/30/2018   Marijuana use 01/29/2018   Lumbar spondylosis 10/22/2017   Lumbar facet arthropathy 10/22/2017   Chronic pain syndrome 10/22/2017   Ulcer of right second toe, limited to breakdown of skin ([place]) 03/24/2017   Lumbar degenerative disc disease 01/06/2017   GERD (gastroesophageal reflux disease) 05/27/2016   Chronic, continuous use of opioids 05/27/2016   Fibromyalgia 05/27/2016   Moderate episode of recurrent major depressive disorder ([place]) 02/01/2016   Hyperhydrosis disorder 10/24/2015   Osteoarthritis 08/16/2015  Morbid obesity ([place]) 08/07/2015   Elevated blood pressure 01/31/2014   Arthritis 06/24/2013   Myalgia and myositis 06/24/2013    Chronic back pain 02/16/2013   Depression, major, in remission ([place]) 01/05/2013   Chronic arthralgias of knees and hips 01/05/2013   Periprosthetic fracture around internal prosthetic left knee joint 08/27/2012   Attention deficit disorder 07/08/2012    Past Medical History:  Diagnosis Date   ADHD (attention deficit hyperactivity disorder)    Ankylosing spondylitis ([place])    Arthritis    Back pain    Broken femur ([place]) 08/27/2012   Chronic pain    chronic opioid use   GERD (gastroesophageal reflux disease)    Heart murmur    asymptomatic   History of methicillin resistant staphylococcus aureus (MRSA) 2009   Hypertension    Migraine    Osteoarthritis    PVC (premature ventricular contraction)    Raynaud disease    Raynaud phenomenon 04/06/2018   [facility] diagnosed 2020    Family History  Problem Relation Age of Onset   Cancer Mother    Breast cancer Mother    Bipolar disorder Mother    Heart failure Father    Cancer Father    Heart disease Father    Bipolar disorder Sister    Cancer Other    Heart failure Other    Past Surgical History:  Procedure Laterality Date   ACHILLES TENDON LENGTHENING Right 02/01/2020   Procedure: ACHILLES TENDON LENGTHENING;  Surgeon: Erle Crocker, MD;  Location: [facility];  Service: Orthopedics;  Laterality: Right;   ARTHRODESIS Right    BACK SURGERY     BLADDER SURGERY     BUNIONECTOMY     CERVICAL BIOPSY  W/ LOOP ELECTRODE EXCISION  1990's   in [place], [place] BIOPSY  04-05-05 and 02-12-06   04-05-05 benign but too rapid growth. RXd Provera and repeat Bx 10mo. 02-12-06 Bx benign--Dr. Edwinna Areola   FEMUR IM NAIL Left 08/27/2012   Procedure: INTRAMEDULLARY (IM) RETROGRADE FEMORAL NAILING;  Surgeon: Mcarthur Rossetti, MD;  Location: [facility];  Service: Orthopedics;  Laterality: Left;   FOOT ARTHRODESIS Right 02/01/2020   Procedure: TRIPLE ARTHRODESIS,  TENDO ACHILLES LENGTHENING;  Surgeon:  Erle Crocker, MD;  Location: [facility];  Service: Orthopedics;  Laterality: Right;  LENGTH OF SURGERY: 4 HOURS   HAMMER TOE SURGERY Right    HEEL SPUR SURGERY     IM NAILING FEMORAL SHAFT FRACTURE Left 08/27/2012   Dr Ninfa Linden   KNEE SURGERY     multiple   OSTEOTOMY     SHOULDER SURGERY Left    SPINAL CORD STIMULATOR INSERTION N/A 02/26/2021   Procedure: THORACIC SPINAL CORD STIMULATOR PLACEMENT, PULSE GENERATOR;  Surgeon: Deetta Perla, MD;  Location: [facility];  Service: Neurosurgery;  Laterality: N/A;   TOTAL KNEE ARTHROPLASTY Bilateral    2009 and 2012   TUBAL LIGATION     VAGINAL HYSTERECTOMY  2018   Social History   Social History Narrative   Not on file   Immunization History  Administered Date(s) Administered   Moderna Sars-Covid-2 Vaccination 05/22/2019, 06/23/2019   Td 08/03/2013   Zoster Recombinat (Shingrix) 01/10/2018, 04/06/2018     Objective: Vital Signs: BP 134/79 (BP Location: Right Arm, Patient Position: Sitting, Cuff Size: Large)   Pulse 62   Resp 17   Ht $R'5\' 6"'CF$  (1.676 m)   Wt 255 lb (115.7 kg)   LMP 03/19/2003   BMI 41.16  kg/m    Physical Exam Constitutional:      Appearance: She is obese.  HENT:     Mouth/Throat:     Mouth: Mucous membranes are moist.     Pharynx: Oropharynx is clear.  Eyes:     Conjunctiva/sclera: Conjunctivae normal.  Cardiovascular:     Rate and Rhythm: Normal rate and regular rhythm.  Pulmonary:     Effort: Pulmonary effort is normal.     Breath sounds: Normal breath sounds.  Musculoskeletal:     Right lower leg: No edema.     Left lower leg: No edema.  Lymphadenopathy:     Cervical: No cervical adenopathy.  Skin:    General: Skin is warm and dry.  Neurological:     Mental Status: She is alert.  Psychiatric:        Mood and Affect: Mood normal.      Musculoskeletal Exam:  Neck full ROM no tenderness Shoulders full ROM tenderness to pressure, left worse than right pain with overhead  abduction Elbows full ROM no tenderness or swelling Left wrist with significant ulnar subluxation, decreased ROM bilaterally Extensive bony nodules of both hands including MCPs, PIPs, and DIPs no palpable synovitis Midline and paraspinal muscle tenderness to palpation in mid back and worse in low back and over sacrum Knees full ROM, well healed surgical scars, right knee mildly warm to touch Ankles full ROM no tenderness or swelling Tender soft tissue nodule on plantar surface of right foot along medial half MTPs right foot multiple digit hammer toes and lateral deviation, left foot with 3rd toe cocked up deformity   Investigation: No additional findings.  Imaging: XR Hand 2 View Left  Result Date: 09/05/2021 X-ray left hand 2 views Extensive cystic and solid probable erosive changes in multiple carpal joints with subluxation and invagination.  Moderately advanced first CMC joint degenerative changes and MCP hyperextension.  Advanced third MCP joint space narrowing with lateral osteophyte other MCPs appear normal.  PIP and DIP joint space with some asymmetric narrowing and lateral osteophyte formation in second and third digits. Impression Chronic inflammatory appearing wrist joint changes with probable erosion and severe subluxation, moderately advanced osteoarthritis changes in thumb and second and third fingers distally  XR Hand 2 View Right  Result Date: 09/05/2021 X-ray right hand 2 views Radiocarpal joint space appears normal.  Mild degenerative changes base of thumb.  Extensive joint space loss at second and third MCP joints with subchondral cysts also articular surface probable erosion or partial joint fusion.  Lateral osteophyte formation and asymmetric joint space narrowing at PIP and DIPs.  Bone mineralization with possible generalized osteopenia. Impression Chronic erosive inflammatory arthritis changes most severe in second and third MCP joints and with generalized osteopenia,  moderate to severe DIP joint osteoarthritis changes   Recent Labs: Lab Results  Component Value Date   WBC 5.2 08/24/2021   HGB 13.4 08/24/2021   PLT 238 08/24/2021   NA 141 08/24/2021   K 5.0 08/24/2021   CL 105 08/24/2021   CO2 28 08/24/2021   GLUCOSE 83 08/24/2021   BUN 18 08/24/2021   CREATININE 0.73 08/24/2021   BILITOT 0.5 08/24/2021   ALKPHOS 123 (H) 09/13/2020   AST 21 08/24/2021   ALT 15 08/24/2021   PROT 6.9 08/24/2021   ALBUMIN 4.4 09/13/2020   CALCIUM 9.4 08/24/2021   GFRAA 115 08/30/2019   QFTBGOLDPLUS NEGATIVE 08/24/2021    Speciality Comments: No specialty comments available.  Procedures:  No procedures performed Allergies:  Wellbutrin [bupropion] and Lyrica [pregabalin]   Assessment / Plan:     Visit Diagnoses: Ankylosing spondylitis of multiple sites in spine ([place]) - Plan: XR Hand 2 View Right, XR Hand 2 View Left  HLA-B27 positive, axial imaging does not show characteristic syndesmophytes or erosive disease. Peripheral joint involvement as well looks worst at left wrist. Previously has had swelling but no obvious synovitis on exam right now. Xrays show erosive appearing changes. Will plan to f/u for discussing DMARD treatment likely rinvoq next best option after Humira nonresponder.  Osteoarthritis, unspecified osteoarthritis type, unspecified site - Plan: XR Hand 2 View Right, XR Hand 2 View Left  Considerable chronic and degenerative changes on imaging and exam. Already working with orthopedics and pain management although does limit activities regularly.  Raynaud's phenomenon without gangrene Chilblains, subsequent encounter  Peripheral circulation defects with no ischemic injury or lesions visible on exam today.   High risk medication use - Plan: Hepatitis B core antibody, IgM, Hepatitis B surface antigen, Hepatitis C antibody, QuantiFERON-TB Gold Plus, CBC with Differential/Platelet, COMPLETE METABOLIC PANEL WITH GFR, Lipid panel  Discussing JAK  inhibitor as possible alternative treatment with inadequate Humira response. Checking baseline labs including hepatitis serology, quantiferon, CBC, CMP, and lipid profile.  Orders: Orders Placed This Encounter  Procedures   XR Hand 2 View Right   XR Hand 2 View Left   Hepatitis B core antibody, IgM   Hepatitis B surface antigen   Hepatitis C antibody   QuantiFERON-TB Gold Plus   CBC with Differential/Platelet   COMPLETE METABOLIC PANEL WITH GFR   Lipid panel   No orders of the defined types were placed in this encounter.    Follow-Up Instructions: No follow-ups on file.   Collier Salina, MD  Note - This record has been created using [employer].  Chart creation errors have been sought, but may not always  have been located. Such creation errors do not reflect on  the standard of medical care.

## 2021-08-27 ENCOUNTER — Ambulatory Visit
Payer: PPO | Attending: Student in an Organized Health Care Education/Training Program | Admitting: Student in an Organized Health Care Education/Training Program

## 2021-08-27 ENCOUNTER — Encounter: Payer: Self-pay | Admitting: Student in an Organized Health Care Education/Training Program

## 2021-08-27 VITALS — BP 142/88 | HR 68 | Temp 97.1°F | Resp 16 | Ht 68.0 in | Wt 249.0 lb

## 2021-08-27 DIAGNOSIS — M48062 Spinal stenosis, lumbar region with neurogenic claudication: Secondary | ICD-10-CM | POA: Diagnosis not present

## 2021-08-27 DIAGNOSIS — M5416 Radiculopathy, lumbar region: Secondary | ICD-10-CM

## 2021-08-27 DIAGNOSIS — G894 Chronic pain syndrome: Secondary | ICD-10-CM

## 2021-08-27 DIAGNOSIS — Z9889 Other specified postprocedural states: Secondary | ICD-10-CM

## 2021-08-27 DIAGNOSIS — G8929 Other chronic pain: Secondary | ICD-10-CM

## 2021-08-27 MED ORDER — OXYCODONE-ACETAMINOPHEN 10-325 MG PO TABS
1.0000 | ORAL_TABLET | Freq: Two times a day (BID) | ORAL | 0 refills | Status: AC | PRN
Start: 2021-09-26 — End: 2021-10-26

## 2021-08-27 MED ORDER — OXYCODONE-ACETAMINOPHEN 10-325 MG PO TABS: 1.0000 | ORAL_TABLET | Freq: Two times a day (BID) | ORAL | 0 refills | Status: DC | PRN

## 2021-08-27 MED ORDER — OXYCODONE-ACETAMINOPHEN 10-325 MG PO TABS: 1.0000 | ORAL_TABLET | Freq: Two times a day (BID) | ORAL | 0 refills | Status: AC | PRN

## 2021-08-27 NOTE — Progress Notes (Signed)
Nursing Pain Medication Assessment:  Safety precautions to be maintained throughout the outpatient stay will include: orient to surroundings, keep bed in low position, maintain call bell within reach at all times, provide assistance with transfer out of bed and ambulation.  Medication Inspection Compliance: Pill count conducted under aseptic conditions, in front of the patient. Neither the pills nor the bottle was removed from the patient's sight at any time. Once count was completed pills were immediately returned to the patient in their original bottle.  Medication #1: Oxycodone/APAP Pill/Patch Count:  0 of 120 pills remain Pill/Patch Appearance: Markings consistent with prescribed medication Bottle Appearance: Standard pharmacy container. Clearly labeled. Filled Date: 05 / 02 / 2023 Last Medication intake:  Ran out of medicine more than 48 hours ago  Medication #2: Hydromorphone (Dilaudid) Pill/Patch Count: 0/90 Pill/Patch Appearance: Markings consistent with prescribed medication Bottle Appearance: Standard pharmacy container. Clearly labeled. Filled Date: 05 /04/2021 Last Medication intake:  Ran out of medicine more than 48 hours ago

## 2021-08-27 NOTE — Progress Notes (Signed)
PROVIDER NOTE: Information contained herein reflects review and annotations entered in association with encounter. Interpretation of such information and data should be left to medically-trained personnel. Information provided to patient can be located elsewhere in the medical record under "Patient Instructions". Document created using STT-dictation technology, any transcriptional errors that may result from process are unintentional.    Patient: Lauren Greene  Service Category: E/M  Provider: Gillis Santa, MD  DOB: 1965-05-26  DOS: 08/27/2021  Specialty: Interventional Pain Management  MRN: [id number]  Setting: Ambulatory outpatient  PCP: Kathyrn Drown, MD  Type: Established Patient    Referring Provider: Kathyrn Drown, MD  Location: Office  Delivery: Face-to-face     HPI  [name], a 56 y.o. year old female, is here today because of her Chronic pain syndrome [G89.4]. Lauren Greene primary complain today is Pain (Primarily back; now c/o generalized body pain) Last encounter: My last encounter with her was on 07/17/21  Pertinent problems: Lauren Greene has Periprosthetic fracture around internal prosthetic left knee joint; Chronic back pain; Morbid obesity ([place]); Lumbar degenerative disc disease; Chronic, continuous use of opioids; Fibromyalgia; Lumbar spondylosis; Chronic pain syndrome; and Encounter for long-term opiate analgesic use on their pertinent problem list. Pain Assessment: Severity of Chronic pain is reported as a 9 /10. Location: Back Lower/denies. Onset: More than a month ago. Quality: Aching, Sharp. Timing: Constant. Modifying factor(s): meds. Vitals:  height is 5' 8" (1.727 m) and weight is 249 lb (112.9 kg). Her temporal temperature is 97.1 F (36.2 C) (abnormal). Her blood pressure is 142/88 (abnormal) and her pulse is 68. Her respiration is 16 and oxygen saturation is 99%.   Reason for encounter: medication management.    Patient presents today for medication  management.  At her last clinic visit, see HPI below, she was endorsing lack of analgesic benefit with oxycodone so we performed an opioid rotation to hydromorphone which the patient took incorrectly in combination with her oxycodone.  She states that even this was not helpful for her increased pain.  I believe this suggests a strong component of central sensitization for Lauren Greene.  At this point, she would like to return back to her previous dose of oxycodone which she will take 20 mg in the morning and 20 mg in the evening as needed.  [employer] Representative Quita Skye is also here today to try and optimize her spinal cord stimulator trial settings to hopefully provide optimize pain relief.   07/17/21 Lauren Greene presents today for medication management.  She is status post [employer] spinal cord stimulator implant with Dr. Lacinda Axon.  Unfortunately, she states that she is not experiencing the same pain relief as she was during her trial.  During her postoperative visit with Dr. Lacinda Axon, x-rays of her thoracic and lumbar spine were obtained which did not show any lead fracture or lead migration.  She states that she is having to charge the battery daily.  She is wondering if she should have it explanted.  She has not met with Quita Skye, [employer] spinal cord stimulator representative to optimize program settings.  I instructed her to consider this before we consider explant as the patient did bring this up today.  She states that she is not having any paresthesias from spinal cord stimulation in her feet which is where she needs them.  She is very tearful today and states that she would like to change her oxycodone medication.  She states that it is not helpful.  She has tried and  failed tramadol, hydrocodone in the past.  We discussed equivalent MME opioid rotation to hydromorphone which corresponds to 4 mg every 8 hours as needed for severe pain.  Pharmacotherapy Assessment  Analgesic:  Percocet 10-20 mg BID (MME 30-60)     Monitoring: [place] PMP: PDMP reviewed during this encounter.       Pharmacotherapy: No side-effects or adverse reactions reported. Compliance: No problems identified. Effectiveness: Clinically acceptable.  Welborn, Susan, RN  08/27/2021  1:46 PM  Sign when Signing Visit Nursing Pain Medication Assessment:  Safety precautions to be maintained throughout the outpatient stay will include: orient to surroundings, keep bed in low position, maintain call bell within reach at all times, provide assistance with transfer out of bed and ambulation.  Medication Inspection Compliance: Pill count conducted under aseptic conditions, in front of the patient. Neither the pills nor the bottle was removed from the patient's sight at any time. Once count was completed pills were immediately returned to the patient in their original bottle.  Medication #1: Oxycodone/APAP Pill/Patch Count:  0 of 120 pills remain Pill/Patch Appearance: Markings consistent with prescribed medication Bottle Appearance: Standard pharmacy container. Clearly labeled. Filled Date: 05 / 02 / 2023 Last Medication intake:  Ran out of medicine more than 48 hours ago  Medication #2: Hydromorphone (Dilaudid) Pill/Patch Count: 0/90 Pill/Patch Appearance: Markings consistent with prescribed medication Bottle Appearance: Standard pharmacy container. Clearly labeled. Filled Date: 05 /04/2021 Last Medication intake:  Ran out of medicine more than 48 hours ago   UDS:  Summary  Date Value Ref Range Status  04/24/2021 Note  Final    Comment:    ==================================================================== [employer] [facility] (MW) ==================================================================== Test                             Result       Flag       Units  Drug Present and Declared for Prescription Verification   Amphetamine                    >9615        EXPECTED   ng/mg creat    Amphetamine is available as a schedule II  prescription drug.    Oxycodone                      2475         EXPECTED   ng/mg creat   Oxymorphone                    882          EXPECTED   ng/mg creat   Noroxycodone                   4996         EXPECTED   ng/mg creat   Noroxymorphone                 161          EXPECTED   ng/mg creat    Sources of oxycodone are scheduled prescription medications.    Oxymorphone, noroxycodone, and noroxymorphone are expected    metabolites of oxycodone. Oxymorphone is also available as a    scheduled prescription medication.  ==================================================================== Test                      Result    Flag   Units        Ref Range   Creatinine              104              mg/dL      >=20 ==================================================================== Declared Medications:  The flagging and interpretation on this report are based on the  following declared medications.  Unexpected results may arise from  inaccuracies in the declared medications.   **Note: The testing scope of this panel includes these medications:   Amphetamine (Adderall)  Oxycodone (Percocet)   **Note: The testing scope of this panel does not include the  following reported medications:   Acetaminophen (Percocet)  Adalimumab (Humira)  Amlodipine (Norvasc)  Diclofenac  Duloxetine (Cymbalta)  Lidocaine (Xylocaine)  Methocarbamol (Robaxin)  Nystatin  Omeprazole (Prilosec)  Trazodone (Desyrel) ==================================================================== For clinical consultation, please call [phone]. ====================================================================      ROS  Constitutional: Denies any fever or chills Gastrointestinal: No reported hemesis, hematochezia, vomiting, or acute GI distress Musculoskeletal:  Low back pain, right foot pain, right ankle pain, bilateral shoulder pain Neurological: No reported episodes of acute onset apraxia, aphasia,  dysarthria, agnosia, amnesia, paralysis, loss of coordination, or loss of consciousness  Medication Review  Pilocarpine HCl, amLODipine, amphetamine-dextroamphetamine, lidocaine, methocarbamol, nystatin, oxyCODONE-acetaminophen, rosuvastatin, and silver sulfADIAZINE  History Review  Allergy: Lauren Greene is allergic to wellbutrin [bupropion] and lyrica [pregabalin]. Drug: Lauren Greene  reports no history of drug use. Alcohol:  reports current alcohol use. Tobacco:  reports that she quit smoking about 21 years ago. Her smoking use included cigarettes. She has a 20.00 pack-year smoking history. She has never used smokeless tobacco. Social: Lauren Greene  reports that she quit smoking about 21 years ago. Her smoking use included cigarettes. She has a 20.00 pack-year smoking history. She has never used smokeless tobacco. She reports current alcohol use. She reports that she does not use drugs. Medical:  has a past medical history of ADHD (attention deficit hyperactivity disorder), Ankylosing spondylitis ([place]), Arthritis, Back pain, Broken femur ([place]) (08/27/2012), Chronic pain, GERD (gastroesophageal reflux disease), Heart murmur, History of methicillin resistant staphylococcus aureus (MRSA) (2009), Hypertension, Migraine, Osteoarthritis, PVC (premature ventricular contraction), Raynaud disease, and Raynaud phenomenon (04/06/2018). Surgical: Lauren Greene  has a past surgical history that includes Total knee arthroplasty (Bilateral); Heel spur surgery; Back surgery; IM nailing femoral shaft fracture (Left, 08/27/2012); Femur IM nail (Left, 08/27/2012); Cervical biopsy w/ loop electrode excision (1990's); Endometrial biopsy (04-05-05 and 02-12-06); Tubal ligation; Shoulder surgery (Left); Vaginal hysterectomy (2018); Foot arthrodesis (Right, 02/01/2020); Achilles tendon lengthening (Right, 02/01/2020); Knee surgery; Bunionectomy; Spinal cord stimulator insertion (N/A, 02/26/2021); Hammer toe surgery (Right);  Osteotomy; Bladder surgery; and Arthrodesis (Right). Family: family history includes Bipolar disorder in her mother and sister; Breast cancer in her mother; Cancer in her father, mother, and another family member; Heart disease in her father; Heart failure in her father and another family member.  Laboratory Chemistry Profile   Renal Lab Results  Component Value Date   BUN 18 08/24/2021   CREATININE 0.73 74/16/3845   BCR NOT APPLICABLE 36/46/8032   GFRAA 115 08/30/2019   GFRNONAA >60 02/26/2021    Hepatic Lab Results  Component Value Date   AST 21 08/24/2021   ALT 15 08/24/2021   ALBUMIN 4.4 09/13/2020   ALKPHOS 123 (H) 09/13/2020   LIPASE 25 01/12/2016    Electrolytes Lab Results  Component Value Date   NA 141 08/24/2021   K 5.0 08/24/2021   CL 105 08/24/2021   CALCIUM 9.4 08/24/2021  Bone Lab Results  Component Value Date   VD25OH 31 01/07/2013    Inflammation (CRP: Acute Phase) (ESR: Chronic Phase) Lab Results  Component Value Date   CRP 2 09/13/2020   ESRSEDRATE 4 04/06/2018         Note: Above Lab results reviewed.  Recent Imaging Review  US Carotid Duplex Bilateral CLINICAL DATA:  Carotid atherosclerosis by CT  EXAM: BILATERAL CAROTID DUPLEX ULTRASOUND  TECHNIQUE: Pearline Cables scale imaging, color Doppler and duplex ultrasound were performed of bilateral carotid and vertebral arteries in the neck.  COMPARISON:  None Available.  FINDINGS: Criteria: Quantification of carotid stenosis is based on velocity parameters that correlate the residual internal carotid diameter with NASCET-based stenosis levels, using the diameter of the distal internal carotid lumen as the denominator for stenosis measurement.  The following velocity measurements were obtained:  RIGHT  ICA: 82/27 cm/sec  CCA: 41/96 cm/sec  SYSTOLIC ICA/CCA RATIO:  0.8  ECA: 106 cm/sec  LEFT  ICA: 104/30 cm/sec  CCA: 22/29 cm/sec  SYSTOLIC ICA/CCA RATIO:  1.1  ECA: 80  cm/sec  RIGHT CAROTID ARTERY: Intimal thickening and trace atherosclerotic changes. Negative for stenosis, velocity elevation, or turbulent flow. No waveform abnormality. Degree of narrowing less than 50% by ultrasound criteria.  RIGHT VERTEBRAL ARTERY:  Normal antegrade flow  LEFT CAROTID ARTERY: Intimal thickening and similar trace thin atherosclerotic changes. Negative for stenosis, velocity elevation, or turbulent flow. Degree of narrowing less than 50% by ultrasound criteria.  LEFT VERTEBRAL ARTERY:  Normal antegrade flow  IMPRESSION: Minor carotid atherosclerosis. Negative for stenosis. Degree of narrowing less than 50% bilaterally by ultrasound criteria.  Patent antegrade vertebral flow bilaterally  Electronically Signed   By: Jerilynn Mages.  Shick M.D.   On: 07/23/2021 15:38 Note: Reviewed        Physical Exam  General appearance: Well nourished, well developed, and well hydrated. In no apparent acute distress Mental status: Alert, oriented x 3 (person, place, & time)       Respiratory: No evidence of acute respiratory distress Eyes: PERLA Vitals: BP (!) 142/88   Pulse 68   Temp (!) 97.1 F (36.2 C) (Temporal)   Resp 16   Ht 5' 8" (1.727 m)   Wt 249 lb (112.9 kg)   LMP 03/19/2003   SpO2 99%   BMI 37.86 kg/m  BMI: Estimated body mass index is 37.86 kg/m as calculated from the following:   Height as of this encounter: 5' 8" (1.727 m).   Weight as of this encounter: 249 lb (112.9 kg). Ideal: Ideal body weight: 63.9 kg (140 lb 14 oz) Adjusted ideal body weight: 83.5 kg (184 lb 2 oz)   Thoracic Spine Area Exam  Skin & Axial Inspection: Well healed scar from previous spine surgery detected Alignment: Symmetrical Functional ROM: Unrestricted ROM Stability: No instability detected Muscle Tone/Strength: Functionally intact. No obvious neuro-muscular anomalies detected. Sensory (Neurological): Unimpaired Muscle strength & Tone: No palpable anomalies  Lumbar Spine Area  Exam  Skin & Axial Inspection: Well healed scar from previous spine surgery detected Alignment: Symmetrical Functional ROM: Unrestricted ROM       Stability: No instability detected Muscle Tone/Strength: Functionally intact. No obvious neuro-muscular anomalies detected. Sensory (Neurological): Unimpaired Palpation:  IPG present         Gait & Posture Assessment  Ambulation: Unassisted Gait: Relatively normal for age and body habitus Posture: WNL  Lower Extremity Exam    Side: Right lower extremity  Side: Left lower extremity  Stability: No instability observed  Stability: No instability observed          Skin & Extremity Inspection: Skin color, temperature, and hair growth are WNL. No peripheral edema or cyanosis. No masses, redness, swelling, asymmetry, or associated skin lesions. No contractures.  Skin & Extremity Inspection: Skin color, temperature, and hair growth are WNL. No peripheral edema or cyanosis. No masses, redness, swelling, asymmetry, or associated skin lesions. No contractures.  Functional ROM: Pain restricted ROM  right ankle and foot          Functional ROM: Unrestricted ROM                  Muscle Tone/Strength: Functionally intact. No obvious neuro-muscular anomalies detected.  Muscle Tone/Strength: Functionally intact. No obvious neuro-muscular anomalies detected.  Sensory (Neurological): Neurogenic pain pattern        Sensory (Neurological): Unimpaired        DTR: Patellar: deferred today Achilles: deferred today Plantar: deferred today  DTR: Patellar: deferred today Achilles: deferred today Plantar: deferred today  Palpation: No palpable anomalies  Palpation: No palpable anomalies    Assessment   Status Diagnosis  Persistent Persistent Persistent 1. Chronic pain syndrome   2. S/P lumbar microdiscectomy (left L4/5 with micordissection)   3. Chronic radicular lumbar pain   4. Spinal stenosis, lumbar region, with neurogenic claudication       Updated Problems: No problems updated.    Plan of Care    Lauren Greene has a current medication list which includes the following long-term medication(s): amlodipine, amphetamine-dextroamphetamine, and rosuvastatin.  Pharmacotherapy (Medications Ordered): Meds ordered this encounter  Medications   oxyCODONE-acetaminophen (PERCOCET) 10-325 MG tablet    Sig: Take 1-2 tablets by mouth every 12 (twelve) hours as needed for pain. Must last 30 days.    Dispense:  120 tablet    Refill:  0    Chronic Pain: STOP Act (Not applicable) Fill 1 day early if closed on refill date. Avoid benzodiazepines within 8 hours of opioids   oxyCODONE-acetaminophen (PERCOCET) 10-325 MG tablet    Sig: Take 1-2 tablets by mouth every 12 (twelve) hours as needed for pain. Must last 30 days.    Dispense:  120 tablet    Refill:  0    Chronic Pain: STOP Act (Not applicable) Fill 1 day early if closed on refill date. Avoid benzodiazepines within 8 hours of opioids   oxyCODONE-acetaminophen (PERCOCET) 10-325 MG tablet    Sig: Take 1-2 tablets by mouth every 12 (twelve) hours as needed for pain. Must last 30 days.    Dispense:  120 tablet    Refill:  0    Chronic Pain: STOP Act (Not applicable) Fill 1 day early if closed on refill date. Avoid benzodiazepines within 8 hours of opioids   Orders:  No orders of the defined types were placed in this encounter.  Follow-up plan:   Return in about 3 months (around 11/27/2021) for Medication Management, in person.    Recent Visits Date Type Provider Dept  07/17/21 Office Visit , , MD [facility]  06/06/21 Office Visit , , MD [facility]  Showing recent visits within past 90 days and meeting all other requirements Today's Visits Date Type Provider Dept  08/27/21 Office Visit , , MD [facility]  Showing today's visits and meeting all other requirements Future Appointments No visits were found  meeting these conditions. Showing future appointments within next 90 days and meeting all other requirements  I discussed the assessment   and treatment plan with the patient. The patient was provided an opportunity to ask questions and all were answered. The patient agreed with the plan and demonstrated an understanding of the instructions.  Patient advised to call back or seek an in-person evaluation if the symptoms or condition worsens.  Duration of encounter: 38mnutes.  Note by: BGillis Santa MD Date: 08/27/2021; Time: 2:22 PM

## 2021-08-30 LAB — CBC WITH DIFFERENTIAL/PLATELET
Absolute Monocytes: 442 cells/uL (ref 200–950)
Basophils Absolute: 52 cells/uL (ref 0–200)
Basophils Relative: 1 %
Eosinophils Absolute: 172 cells/uL (ref 15–500)
Eosinophils Relative: 3.3 %
HCT: 40.7 % (ref 35.0–45.0)
Hemoglobin: 13.4 g/dL (ref 11.7–15.5)
Lymphs Abs: 1638 cells/uL (ref 850–3900)
MCH: 27.6 pg (ref 27.0–33.0)
MCHC: 32.9 g/dL (ref 32.0–36.0)
MCV: 83.7 fL (ref 80.0–100.0)
MPV: 10.1 fL (ref 7.5–12.5)
Monocytes Relative: 8.5 %
Neutro Abs: 2896 cells/uL (ref 1500–7800)
Neutrophils Relative %: 55.7 %
Platelets: 238 10*3/uL (ref 140–400)
RBC: 4.86 10*6/uL (ref 3.80–5.10)
RDW: 13.6 % (ref 11.0–15.0)
Total Lymphocyte: 31.5 %
WBC: 5.2 10*3/uL (ref 3.8–10.8)

## 2021-08-30 LAB — LIPID PANEL
Cholesterol: 145 mg/dL (ref <200)
HDL: 62 mg/dL (ref >=50)
LDL Cholesterol (Calc): 65 mg/dL (calc)
Non-HDL Cholesterol (Calc): 83 mg/dL (calc) (ref <130)
Total CHOL/HDL Ratio: 2.3 (calc) (ref <5.0)
Triglycerides: 96 mg/dL (ref <150)

## 2021-08-30 LAB — QUANTIFERON-TB GOLD PLUS
Mitogen-NIL: 5.78 IU/mL
NIL: 0.04 IU/mL
QuantiFERON-TB Gold Plus: NEGATIVE
TB1-NIL: <0 IU/mL
TB2-NIL: 0 IU/mL

## 2021-08-30 LAB — COMPLETE METABOLIC PANEL WITH GFR
AG Ratio: 1.6 (calc) (ref 1.0–2.5)
ALT: 15 U/L (ref 6–29)
AST: 21 U/L (ref 10–35)
Albumin: 4.2 g/dL (ref 3.6–5.1)
Alkaline phosphatase (APISO): 117 U/L (ref 37–153)
BUN: 18 mg/dL (ref 7–25)
CO2: 28 mmol/L (ref 20–32)
Calcium: 9.4 mg/dL (ref 8.6–10.4)
Chloride: 105 mmol/L (ref 98–110)
Creat: 0.73 mg/dL (ref 0.50–1.03)
Globulin: 2.7 g/dL (calc) (ref 1.9–3.7)
Glucose, Bld: 83 mg/dL (ref 65–99)
Potassium: 5 mmol/L (ref 3.5–5.3)
Sodium: 141 mmol/L (ref 135–146)
Total Bilirubin: 0.5 mg/dL (ref 0.2–1.2)
Total Protein: 6.9 g/dL (ref 6.1–8.1)
eGFR: 96 mL/min/{1.73_m2} (ref >=60)

## 2021-08-30 LAB — HEPATITIS C ANTIBODY
Hepatitis C Ab: NONREACTIVE
SIGNAL TO CUT-OFF: 0.08 (ref <1.00)

## 2021-08-30 LAB — HEPATITIS B CORE ANTIBODY, IGM: Hep B C IgM: NONREACTIVE

## 2021-08-30 LAB — HEPATITIS B SURFACE ANTIGEN: Hepatitis B Surface Ag: NONREACTIVE

## 2021-09-04 ENCOUNTER — Ambulatory Visit (INDEPENDENT_AMBULATORY_CARE_PROVIDER_SITE_OTHER): Payer: PPO | Admitting: Family Medicine

## 2021-09-04 VITALS — BP 120/82 | HR 71 | Temp 98.1°F | Ht 68.0 in | Wt 251.4 lb

## 2021-09-04 DIAGNOSIS — F988 Other specified behavioral and emotional disorders with onset usually occurring in childhood and adolescence: Secondary | ICD-10-CM | POA: Diagnosis not present

## 2021-09-04 MED ORDER — AMPHETAMINE-DEXTROAMPHET ER 30 MG PO CP24: 30.0000 mg | ORAL_CAPSULE | ORAL | 0 refills | Status: DC

## 2021-09-04 MED ORDER — AMPHETAMINE-DEXTROAMPHET ER 30 MG PO CP24: ORAL_CAPSULE | ORAL | 0 refills | Status: DC

## 2021-09-04 NOTE — Progress Notes (Signed)
   Subjective:    Patient ID: Lauren Greene, female    DOB: 07-11-1965, 56 y.o.   MRN: [id number]  HPI  This patient has adult ADD. Takes medication responsibly. Medication does help the patient focus in be more functional. Patient relates that they are or not abusing the medication or misusing the medication. The patient understands that if they're having any negative side effects such as elevated high blood pressure severe headaches they would need stop the medication follow-up immediately. They also understand that the prescriptions are to last for 3 months then the patient will need to follow-up before having further prescriptions.  Patient compliance Yes  Does medication help patient function /attention better  Yes Patient has adult ADD She has been on the medicine for years It does help her stay more attentive and focused in her activities She has been having a very difficult time with rheumatologic pain as well as chronic pain with her foot and back She sees a pain clinic on a regular basis they have a currently on oxycodone She also sees rheumatology and they will be starting a new regimen in the near future Side effects  None   Review of Systems     Objective:   Physical Exam  General-in no acute distress Eyes-no discharge Lungs-respiratory rate normal, CTA CV-no murmurs,RRR Extremities skin warm dry no edema Neuro grossly normal Behavior normal, alert       Assessment & Plan:   Adult ADD Does well with medicine Does allow her to be more attentive and focused on what she does See discussion above Continue current medicines 3 prescription sent in Follow-up in approximately 3 months Follow-up sooner problems

## 2021-09-05 ENCOUNTER — Ambulatory Visit: Payer: PPO | Admitting: Internal Medicine

## 2021-09-05 ENCOUNTER — Encounter: Payer: Self-pay | Admitting: Internal Medicine

## 2021-09-05 VITALS — BP 141/96 | HR 71 | Wt 254.0 lb

## 2021-09-05 DIAGNOSIS — Z79899 Other long term (current) drug therapy: Secondary | ICD-10-CM | POA: Diagnosis not present

## 2021-09-05 DIAGNOSIS — M450 Ankylosing spondylitis of multiple sites in spine: Secondary | ICD-10-CM

## 2021-09-05 DIAGNOSIS — M1990 Unspecified osteoarthritis, unspecified site: Secondary | ICD-10-CM

## 2021-09-05 DIAGNOSIS — G894 Chronic pain syndrome: Secondary | ICD-10-CM

## 2021-09-05 NOTE — Progress Notes (Signed)
Office Visit Note  Patient: Lauren Greene             Date of Birth: 12/12/1965           MRN: [id number]             PCP: Kathyrn Drown, MD Referring: Kathyrn Drown, MD Visit Date: 09/05/2021   Subjective:  Follow-up (Pain, stiffness)   History of Present Illness: Lauren Greene is a 56 y.o. female here for follow up with history of ankylosing spondylitis and chronic pain of multiple areas both axial disease and peripheral joint pains. Xrays at initial visit showing changes of OA also concerning for MCP joint erosion. Baseline labs for considering trial of rinvoq were normal. She continues with similar symptoms of joint pains also prominent stiffness symptoms.   Previous HPI 08/24/21 Lauren Greene is a 56 y.o. female here for osteoarthritis with chronic pain of multiple areas and history of ankylosing spondylitis. She was previously seen at [facility] most recently 01/2021 tried Humira last year. Some active peripheral arthritis was reported at those encounters, in addition to chronic back pain and some generalized pain. She has had multiple orthopedic surgery and seeing neurosurgery for spinal stimulator placement  DMARD Hx Humira 01/2021  Review of Systems  Constitutional:  Positive for fatigue.  HENT:  Positive for mouth dryness.   Eyes:  Positive for dryness.  Respiratory:  Negative for shortness of breath.   Cardiovascular:  Negative for swelling in legs/feet.  Gastrointestinal:  Negative for constipation.  Endocrine: Positive for heat intolerance and excessive thirst.  Genitourinary:  Negative for difficulty urinating.  Musculoskeletal:  Positive for joint pain, gait problem, joint pain, joint swelling, muscle weakness, morning stiffness and muscle tenderness.  Skin:  Negative for rash.  Allergic/Immunologic: Negative for susceptible to infections.  Neurological:  Positive for weakness.  Hematological:  Positive for bruising/bleeding tendency.   Psychiatric/Behavioral:  Positive for sleep disturbance.     PMFS History:  Patient Active Problem List   Diagnosis Date Noted   High risk medication use 08/24/2021   Spinal stenosis, lumbar region, with neurogenic claudication 07/17/2021   Localized osteoarthritis of shoulder regions, bilateral 04/24/2021   Chronic pain of both shoulders 04/24/2021   Failed back surgical syndrome 04/24/2021   S/P lumbar microdiscectomy (left L4/5 with micordissection) 04/24/2021   Ankylosing spondylitis ([place]) 01/08/2021   HLA B27 positive 01/08/2021   Primary osteoarthritis of right ankle 11/23/2019   Essential hypertension 08/30/2019   Chronic radicular lumbar pain 08/23/2019   Other hyperlipidemia 11/16/2018   Foot pain, bilateral 06/04/2018   Raynaud phenomenon 04/06/2018   Overactive bladder 04/06/2018   Cellulitis of toe, right 04/01/2018   Chilblains 04/01/2018   Encounter for long-term opiate analgesic use 03/30/2018   Marijuana use 01/29/2018   Lumbar spondylosis 10/22/2017   Lumbar facet arthropathy 10/22/2017   Chronic pain syndrome 10/22/2017   Ulcer of right second toe, limited to breakdown of skin ([place]) 03/24/2017   Lumbar degenerative disc disease 01/06/2017   GERD (gastroesophageal reflux disease) 05/27/2016   Chronic, continuous use of opioids 05/27/2016   Fibromyalgia 05/27/2016   Moderate episode of recurrent major depressive disorder ([place]) 02/01/2016   Hyperhydrosis disorder 10/24/2015   Osteoarthritis 08/16/2015   Morbid obesity ([place]) 08/07/2015   Elevated blood pressure 01/31/2014   Arthritis 06/24/2013   Myalgia and myositis 06/24/2013   Chronic back pain 02/16/2013   Depression, major, in remission ([place]) 01/05/2013   Chronic arthralgias of knees and  hips 01/05/2013   Periprosthetic fracture around internal prosthetic left knee joint 08/27/2012   Attention deficit disorder 07/08/2012    Past Medical History:  Diagnosis Date   ADHD (attention deficit  hyperactivity disorder)    Ankylosing spondylitis (H[facility])    Arthritis    Back pain    Broken femur ([place]) 08/27/2012   Chronic pain    chronic opioid use   GERD (gastroesophageal reflux disease)    Heart murmur    asymptomatic   History of methicillin resistant staphylococcus aureus (MRSA) 2009   Hypertension    Migraine    Osteoarthritis    PVC (premature ventricular contraction)    Raynaud disease    Raynaud phenomenon 04/06/2018   [facility] diagnosed 2020    Family History  Problem Relation Age of Onset   Cancer Mother    Breast cancer Mother    Bipolar disorder Mother    Heart failure Father    Cancer Father    Heart disease Father    Bipolar disorder Sister    Cancer Other    Heart failure Other    Past Surgical History:  Procedure Laterality Date   ACHILLES TENDON LENGTHENING Right 02/01/2020   Procedure: ACHILLES TENDON LENGTHENING;  Surgeon: Erle Crocker, MD;  Location: [facility];  Service: Orthopedics;  Laterality: Right;   ARTHRODESIS Right    BACK SURGERY     BLADDER SURGERY     BUNIONECTOMY     CERVICAL BIOPSY  W/ LOOP ELECTRODE EXCISION  1990's   in [place], [place] BIOPSY  04-05-05 and 02-12-06   04-05-05 benign but too rapid growth. RXd Provera and repeat Bx 79mo. 02-12-06 Bx benign--Dr. Edwinna Areola   FEMUR IM NAIL Left 08/27/2012   Procedure: INTRAMEDULLARY (IM) RETROGRADE FEMORAL NAILING;  Surgeon: Mcarthur Rossetti, MD;  Location: [facility];  Service: Orthopedics;  Laterality: Left;   FOOT ARTHRODESIS Right 02/01/2020   Procedure: TRIPLE ARTHRODESIS,  TENDO ACHILLES LENGTHENING;  Surgeon: Erle Crocker, MD;  Location: [facility];  Service: Orthopedics;  Laterality: Right;  LENGTH OF SURGERY: 4 HOURS   HAMMER TOE SURGERY Right    HEEL SPUR SURGERY     IM NAILING FEMORAL SHAFT FRACTURE Left 08/27/2012   Dr Ninfa Linden   KNEE SURGERY     multiple   OSTEOTOMY     SHOULDER SURGERY Left     SPINAL CORD STIMULATOR INSERTION N/A 02/26/2021   Procedure: THORACIC SPINAL CORD STIMULATOR PLACEMENT, PULSE GENERATOR;  Surgeon: Deetta Perla, MD;  Location: [facility];  Service: Neurosurgery;  Laterality: N/A;   TOTAL KNEE ARTHROPLASTY Bilateral    2009 and 2012   TUBAL LIGATION     VAGINAL HYSTERECTOMY  2018   Social History   Social History Narrative   Not on file   Immunization History  Administered Date(s) Administered   Moderna Sars-Covid-2 Vaccination 05/22/2019, 06/23/2019   Td 08/03/2013   Zoster Recombinat (Shingrix) 01/10/2018, 04/06/2018     Objective: Vital Signs: BP (!) 141/96 (BP Location: Left Arm, Patient Position: Sitting, Cuff Size: Normal)   Pulse 71   Wt 254 lb (115.2 kg)   LMP 03/19/2003   BMI 38.62 kg/m    Physical Exam Constitutional:      Appearance: She is obese.  Cardiovascular:     Rate and Rhythm: Normal rate and regular rhythm.  Pulmonary:     Effort: Pulmonary effort is normal.     Breath sounds: Normal breath sounds.  Musculoskeletal:     Right lower leg: No edema.     Left lower leg: No edema.  Skin:    General: Skin is warm and dry.     Findings: No rash.  Neurological:     Mental Status: She is alert.  Psychiatric:        Mood and Affect: Mood normal.      Musculoskeletal Exam:  Shoulders full ROM, left worse than right pain with overhead abduction Elbows full ROM no tenderness or swelling Left wrist with significant ulnar subluxation and decreased ROM Extensive bony nodules of both hands including MCPs, PIPs, and DIPs no palpable synovitis Midline and paraspinal muscle tenderness to palpation in mid back and worse in low back and over sacrum Knees full ROM, well healed surgical scars MTPs right foot multiple digit hammer toes and lateral deviation, left foot with 3rd toe cocked up deformity  Investigation: No additional findings.  Imaging: XR Hand 2 View Left  Result Date: 09/05/2021 X-ray left hand 2 views Extensive  cystic and solid probable erosive changes in multiple carpal joints with subluxation and invagination.  Moderately advanced first CMC joint degenerative changes and MCP hyperextension.  Advanced third MCP joint space narrowing with lateral osteophyte other MCPs appear normal.  PIP and DIP joint space with some asymmetric narrowing and lateral osteophyte formation in second and third digits. Impression Chronic inflammatory appearing wrist joint changes with probable erosion and severe subluxation, moderately advanced osteoarthritis changes in thumb and second and third fingers distally  XR Hand 2 View Right  Result Date: 09/05/2021 X-ray right hand 2 views Radiocarpal joint space appears normal.  Mild degenerative changes base of thumb.  Extensive joint space loss at second and third MCP joints with subchondral cysts also articular surface probable erosion or partial joint fusion.  Lateral osteophyte formation and asymmetric joint space narrowing at PIP and DIPs.  Bone mineralization with possible generalized osteopenia. Impression Chronic erosive inflammatory arthritis changes most severe in second and third MCP joints and with generalized osteopenia, moderate to severe DIP joint osteoarthritis changes   Recent Labs: Lab Results  Component Value Date   WBC 5.2 08/24/2021   HGB 13.4 08/24/2021   PLT 238 08/24/2021   NA 141 08/24/2021   K 5.0 08/24/2021   CL 105 08/24/2021   CO2 28 08/24/2021   GLUCOSE 83 08/24/2021   BUN 18 08/24/2021   CREATININE 0.73 08/24/2021   BILITOT 0.5 08/24/2021   ALKPHOS 123 (H) 09/13/2020   AST 21 08/24/2021   ALT 15 08/24/2021   PROT 6.9 08/24/2021   ALBUMIN 4.4 09/13/2020   CALCIUM 9.4 08/24/2021   GFRAA 115 08/30/2019   QFTBGOLDPLUS NEGATIVE 08/24/2021    Speciality Comments: No specialty comments available.  Procedures:  No procedures performed Allergies: Wellbutrin [bupropion] and Lyrica [pregabalin]   Assessment / Plan:     Visit Diagnoses:  Ankylosing spondylitis of multiple sites in spine [facility])  Findings are suggestive for active ankylosing spondylitis with pronounced stiffness/morning symptoms also peripheral joint changes with erosions. She also has considerable existing OA that may not improve as much on treatment. Humira primary nonresponder. Plan to start rinvoq 15 mg PO daily for.  Osteoarthritis, unspecified osteoarthritis type, unspecified site Chronic pain syndrome  Established patient with orthopedic surgery clinic and pain management for long term pain with degenerative arthritis in multiple areas s/p bilateral knee arthroplasty.  High risk medication use  Discussed risks of rinvoq including infection, malignancy, hepatotoxicity, blood clots, cardiovascular risk and hyperlipidemia.  She has no history of blood clots or diverticulitis. Baseline lab screening okay.  Orders: No orders of the defined types were placed in this encounter.  No orders of the defined types were placed in this encounter.    Follow-Up Instructions: Return in about 3 months (around 12/06/2021) for AS rinvoq start f/u 2-83mos.   Collier Salina, MD  Note - This record has been created using [employer].  Chart creation errors have been sought, but may not always  have been located. Such creation errors do not reflect on  the standard of medical care.

## 2021-09-05 NOTE — Patient Instructions (Signed)
Upadacitinib Extended-Release Tablets What is this medication? UPADACITINIB (ue PAD a SYE ti nib) treats autoimmune conditions, such as arthritis, eczema, and ulcerative colitis. It is prescribed when other medications have not worked or cannot be tolerated. It works by slowing down an overactive immune system. This decreases inflammation. This medicine may be used for other purposes; ask your health care provider or pharmacist if you have questions. COMMON BRAND NAME(S): RINVOQ What should I tell my care team before I take this medication? They need to know if you have any of these conditions: Blood clots Cancer Diabetes (high blood sugar) Heart disease High blood pressure High cholesterol Immune system problems Infection, especially a viral infection such as chickenpox, cold sores, or herpes Infection such as tuberculosis (TB) or other bacterial, fungal or viral infection Kidney disease Liver disease Low blood counts (white cells, platelets, or red blood cells) Lung or breathing disease (asthma, COPD) Organ transplant Recent or upcoming vaccine Skin cancer/melanoma Smoke tobacco cigarettes Stomach or intestine problems Stroke An unusual or allergic reaction to upadacitinib, other medications, foods, dyes or preservatives Pregnant or trying to get pregnant Breast-feeding How should I use this medication? Take this medication by mouth with water. Take it as directed on the prescription label at the same time every day. Do not cut, crush, or chew this medication. Swallow the tablets whole. You can take it with or without food. If it upsets your stomach, take it with food. Keep taking it unless your care team tells you to stop. A special MedGuide will be given to you by the pharmacist with each prescription and refill. Be sure to read this information carefully each time. Talk to your care team about the use of this medication in children. While this medication may be prescribed for  children as young as 12 years for selected conditions, precautions do apply. Overdosage: If you think you have taken too much of this medicine contact a poison control center or emergency room at once. NOTE: This medicine is only for you. Do not share this medicine with others. What if I miss a dose? If you miss a dose, take it as soon as you can. If it is almost time for your next dose, take only that dose. Do not take double or extra doses. What may interact with this medication? Do not take this medication with any of the following: Baricitinib Tofacitinib This medication may also interact with the following: Azathioprine Certain antivirals for hepatitis or HIV Biologic medications, such as abatacept, adalimumab, anakinra, certolizumab, etanercept, golimumab, infliximab, rituximab, secukinumab, tocilizumab, ustekinumab Certain medications for fungal infections, such as ketoconazole, itraconazole, posaconazole, or voriconazole Certain medications for seizures, such as carbamazepine, phenobarbital, phenytoin Clarithromycin Cyclosporine Live vaccines Medications that lower your chance of fighting infection Mifepristone Nefazodone Rifampin Supplements, such as St. John's wort This list may not describe all possible interactions. Give your health care provider a list of all the medicines, herbs, non-prescription drugs, or dietary supplements you use. Also tell them if you smoke, drink alcohol, or use illegal drugs. Some items may interact with your medicine. What should I watch for while using this medication? Visit your care team for regular checks on your progress. Tell your care team if your symptoms do not start to get better or if they get worse. You may need blood work done while you are taking this medication. Avoid taking medications that contain aspirin, acetaminophen, ibuprofen, naproxen, or ketoprofen unless instructed by your care team. These medications may hide a fever. This  medication may increase your risk of getting an infection. Call your care team for advice if you get a fever, chills, sore throat, or other symptoms of a cold or flu. Do not treat yourself. Try to avoid being around people who are sick. Do not become pregnant while taking this medication. Women should inform their care team if they wish to become pregnant or think they might be pregnant. Women should use a form of birth control while taking this medication. Women will also need to take it for 4 weeks after stopping this medication. There is potential for serious harm to an unborn child. Talk to your care team for more information. Do not breast-feed an infant while taking this medication or for 6 days after stopping it. Talk to your care team about your risk of cancer. You may be more at risk for certain types of cancer if you take this medication. Talk to your care team about your risk of skin cancer. You may be more at risk for skin cancer if you take this medication. This medication can make you more sensitive to the sun. Keep out of the sun. If you cannot avoid being in the sun, wear protective clothing and sunscreen. Do not use sun lamps or tanning beds/booths. Tell your care team right away if you have any change in your eyesight. What side effects may I notice from receiving this medication? Side effects that you should report to your care team as soon as possible: Allergic reactions--skin rash, itching, hives, swelling of the face, lips, tongue, or throat Blood clot--pain, swelling, or warmth in the leg, shortness of breath, chest pain Change in vision Heart attack--pain or tightness in the chest, shoulders, arms, or jaw, nausea, shortness of breath, cold or clammy skin, feeling faint or lightheaded Infection--fever, chills, cough, sore throat, wounds that don't heal, pain or trouble when passing urine, general feeling of discomfort or being unwell Liver injury--right upper belly pain, loss of  appetite, nausea, light-colored stool, dark yellow or brown urine, yellowing skin or eyes, unusual weakness or fatigue Low red blood cell level--unusual weakness or fatigue, dizziness, headache, trouble breathing Sudden or severe stomach pain, bloody diarrhea, fever, nausea, vomiting Stroke--sudden numbness or weakness of the face, arm, or leg, trouble speaking, confusion, trouble walking, loss of balance or coordination, dizziness, severe headache, change in vision Side effects that usually do not require medical attention (report to your care team if they continue or are bothersome): Acne Cough Headache Nausea Runny or stuffy nose This list may not describe all possible side effects. Call your doctor for medical advice about side effects. You may report side effects to FDA at 1-800-FDA-1088. Where should I keep my medication? Keep out of the reach of children and pets. Store at room temperature between 20 and 25 degrees C (68 and 77 degrees F). Get rid of any unused medication after the expiration date. To get rid of medications that are no longer needed or have expired: Take the medication to a medication take-back program. Check with your pharmacy or law enforcement to find a location. If you cannot return the medication, check the label or package insert to see if the medication should be thrown out in the garbage or flushed down the toilet. If you are not sure, ask your care team. If it is safe to put it in the trash, empty the medication out of the container. Mix the medication with cat litter, dirt, coffee grounds, or other unwanted substance. Seal the mixture  in a bag or container. Put it in the trash. NOTE: This sheet is a summary. It may not cover all possible information. If you have questions about this medicine, talk to your doctor, pharmacist, or health care provider.  2023 Elsevier/Gold Standard (2021-02-23 00:00:00)  

## 2021-09-13 ENCOUNTER — Telehealth: Payer: Self-pay | Admitting: Internal Medicine

## 2021-09-13 NOTE — Telephone Encounter (Signed)
Patient called checking the status of her PA for Rinvoq medication.  Patient requested a return call.

## 2021-09-17 NOTE — Telephone Encounter (Signed)
Yes, I thought there was some result pending but looks like everything already resulted and no contraindications. Plan is to start rinvoq for AS patient lack of response on Humira with previous rheumatologist.

## 2021-09-19 ENCOUNTER — Other Ambulatory Visit: Payer: Self-pay | Admitting: Student in an Organized Health Care Education/Training Program

## 2021-09-19 NOTE — Telephone Encounter (Signed)
Left message to advise patient the pharmacy team will work on her PA.

## 2021-09-20 ENCOUNTER — Telehealth: Payer: Self-pay | Admitting: Pharmacist

## 2021-09-20 DIAGNOSIS — Z79899 Other long term (current) drug therapy: Secondary | ICD-10-CM

## 2021-09-20 DIAGNOSIS — M450 Ankylosing spondylitis of multiple sites in spine: Secondary | ICD-10-CM

## 2021-09-20 NOTE — Telephone Encounter (Signed)
Please start Rinvoq BIV.  Dose: 15mg  once daily  Dx: Ankylosing spondylitis (M45.0)  Previously tried therapies: Humira 01/2021 - no clinical response Diclofenac 100 mg twice daily  Therapies patient unable to try: TNF inhibitors (Enbrel, Simponi, Cimzia)  02/2021, PharmD, MPH, BCPS, CPP Clinical Pharmacist (Rheumatology and Pulmonology)

## 2021-09-21 ENCOUNTER — Other Ambulatory Visit (HOSPITAL_COMMUNITY): Payer: Self-pay

## 2021-09-21 NOTE — Telephone Encounter (Signed)
Submitted an URGENT Prior Authorization request to  [employer]  for [facility] via CoverMyMeds. Will update once we receive a response.  Key: [id number]  Chesley Mires, PharmD, MPH, BCPS, CPP Clinical Pharmacist (Rheumatology and Pulmonology)

## 2021-09-21 NOTE — Telephone Encounter (Signed)
Received notification from  [employer]  regarding a prior authorization for [facility]. Authorization has been APPROVED from 09/21/21 to 03/24/22.   Per test claim, copay for 30 days supply is $10.35  Patient can fill through [facility]: [phone]   Authorization # [id number]  Chesley Mires, PharmD, MPH, BCPS, CPP Clinical Pharmacist (Rheumatology and Pulmonology)

## 2021-09-24 ENCOUNTER — Telehealth: Payer: Self-pay | Admitting: Internal Medicine

## 2021-09-24 ENCOUNTER — Other Ambulatory Visit (HOSPITAL_COMMUNITY): Payer: Self-pay

## 2021-09-24 MED ORDER — RINVOQ 15 MG PO TB24
15.0000 mg | ORAL_TABLET | Freq: Every day | ORAL | 0 refills | Status: DC
  Filled 2021-09-24: qty 90, 90d supply, fill #0

## 2021-09-24 NOTE — Telephone Encounter (Signed)
Reached out to patient to schedule follow up with Dr. Dimple Casey in 6-8 weeks. Patient states she has not started or even received her Rinvoq and will call back to schedule once she starts.

## 2021-09-24 NOTE — Telephone Encounter (Signed)
-----   Message from [facility], RPH-CPP sent at 09/24/2021 11:17 AM EDT ----- Regarding: F/u appt needed with Dr. Dimple Casey Hello!  Pt is starting Rinvoq and will need f/u appt with Dr. Dimple Casey in 6-8 weeks. Could you reach out and have appt scheduled?  Thanks! Devki

## 2021-09-24 NOTE — Telephone Encounter (Signed)
I spoke with patient regarding Rinvoq approval. She states she was previously receiving Humira through pt assistance due to medication pushing her into donut hole. I advised that usually the first fill pushes folks into donut hole and since test claim shows that it has not, then I don't anticipate that subsequent fills will have increased copay at least for this year. If copay does significantly increase, we will pursue pt assistance of course.  Rx for Rinvoq 15mg  daily sent to [facility]. Standing order for CBC, CMP, and lipid panel placed today.  Patient aware that [facility] wil lreach out for first shipment but subsequent fills will be [facility]. MyChart message sent to pt.  Routing to scheduling team to ensure pt has f/u visit with Dr. [facility] in 6-8 weeks  Dimple Casey, PharmD, MPH, BCPS, CPP Clinical Pharmacist (Rheumatology and Pulmonology)

## 2021-09-25 ENCOUNTER — Other Ambulatory Visit (HOSPITAL_COMMUNITY): Payer: Self-pay

## 2021-09-26 ENCOUNTER — Other Ambulatory Visit (HOSPITAL_COMMUNITY): Payer: Self-pay

## 2021-09-26 NOTE — Telephone Encounter (Signed)
ATC pt, however phone number goes directly to VM. LVM requesting call back, direct number provided.

## 2021-09-27 ENCOUNTER — Other Ambulatory Visit (HOSPITAL_COMMUNITY): Payer: Self-pay

## 2021-09-27 NOTE — Telephone Encounter (Signed)
Spoke with pt, she states that just this once she would like to go pick it up from [facility] as she would like to get started on the medication ASAP. Will create a profile for her in [employer] to coordinate f/u refills.

## 2021-09-28 ENCOUNTER — Other Ambulatory Visit (HOSPITAL_COMMUNITY): Payer: Self-pay

## 2021-09-28 NOTE — Telephone Encounter (Signed)
Patient left VM on [place]'s phone at 5:30p yesterday. Mills Koller is OOO.  ATC patient to determine if she successfully picked up her Rinvoq [facility]. Left VM requesting return call  Chesley Mires, PharmD, MPH, BCPS, CPP Clinical Pharmacist (Rheumatology and Pulmonology)

## 2021-10-01 ENCOUNTER — Other Ambulatory Visit (HOSPITAL_COMMUNITY): Payer: Self-pay

## 2021-10-02 ENCOUNTER — Other Ambulatory Visit (HOSPITAL_COMMUNITY): Payer: Self-pay

## 2021-10-18 ENCOUNTER — Other Ambulatory Visit (HOSPITAL_COMMUNITY): Payer: Self-pay

## 2021-10-23 ENCOUNTER — Other Ambulatory Visit: Payer: Self-pay | Admitting: Family Medicine

## 2021-10-23 ENCOUNTER — Telehealth: Payer: Self-pay | Admitting: Family Medicine

## 2021-10-23 MED ORDER — AMPHETAMINE-DEXTROAMPHET ER 30 MG PO CP24
ORAL_CAPSULE | ORAL | 0 refills | Status: DC
Start: 2021-10-23 — End: 2021-11-15

## 2021-10-23 MED ORDER — AMPHETAMINE-DEXTROAMPHET ER 30 MG PO CP24: 30.0000 mg | ORAL_CAPSULE | ORAL | 0 refills | Status: DC

## 2021-10-23 NOTE — Telephone Encounter (Signed)
Patient is requesting refill on amlodipine 2.5 mg and a correction on her adderall XR 30 mg  should be 2 capsules in the morning instead of one. Her June prescription was right but her next two prescriptions had 1 day in the morning . She has appointment in 9/19 for medication follow up. Lauren Greene

## 2021-11-15 ENCOUNTER — Other Ambulatory Visit: Payer: Self-pay | Admitting: Family Medicine

## 2021-11-20 ENCOUNTER — Encounter: Payer: Self-pay | Admitting: Student in an Organized Health Care Education/Training Program

## 2021-11-20 ENCOUNTER — Ambulatory Visit
Payer: PPO | Attending: Student in an Organized Health Care Education/Training Program | Admitting: Student in an Organized Health Care Education/Training Program

## 2021-11-20 VITALS — BP 144/90 | HR 72 | Temp 97.4°F | Resp 18 | Ht 68.0 in | Wt 254.0 lb

## 2021-11-20 DIAGNOSIS — Z79891 Long term (current) use of opiate analgesic: Secondary | ICD-10-CM | POA: Insufficient documentation

## 2021-11-20 DIAGNOSIS — G8929 Other chronic pain: Secondary | ICD-10-CM | POA: Insufficient documentation

## 2021-11-20 DIAGNOSIS — M5136 Other intervertebral disc degeneration, lumbar region: Secondary | ICD-10-CM | POA: Diagnosis not present

## 2021-11-20 DIAGNOSIS — M25511 Pain in right shoulder: Secondary | ICD-10-CM | POA: Diagnosis not present

## 2021-11-20 DIAGNOSIS — M961 Postlaminectomy syndrome, not elsewhere classified: Secondary | ICD-10-CM | POA: Insufficient documentation

## 2021-11-20 DIAGNOSIS — M5416 Radiculopathy, lumbar region: Secondary | ICD-10-CM | POA: Insufficient documentation

## 2021-11-20 DIAGNOSIS — M47816 Spondylosis without myelopathy or radiculopathy, lumbar region: Secondary | ICD-10-CM | POA: Diagnosis not present

## 2021-11-20 DIAGNOSIS — M48062 Spinal stenosis, lumbar region with neurogenic claudication: Secondary | ICD-10-CM | POA: Insufficient documentation

## 2021-11-20 DIAGNOSIS — G894 Chronic pain syndrome: Secondary | ICD-10-CM | POA: Insufficient documentation

## 2021-11-20 DIAGNOSIS — Z9889 Other specified postprocedural states: Secondary | ICD-10-CM | POA: Insufficient documentation

## 2021-11-20 DIAGNOSIS — M25512 Pain in left shoulder: Secondary | ICD-10-CM | POA: Diagnosis not present

## 2021-11-20 MED ORDER — METHOCARBAMOL 750 MG PO TABS
750.0000 mg | ORAL_TABLET | Freq: Three times a day (TID) | ORAL | 5 refills | Status: DC | PRN
Start: 2021-11-20 — End: 2022-03-14

## 2021-11-20 MED ORDER — OXYCODONE-ACETAMINOPHEN 10-325 MG PO TABS: 1.0000 | ORAL_TABLET | Freq: Two times a day (BID) | ORAL | 0 refills | Status: AC | PRN

## 2021-11-20 MED ORDER — OXYCODONE-ACETAMINOPHEN 10-325 MG PO TABS: 1.0000 | ORAL_TABLET | Freq: Two times a day (BID) | ORAL | 0 refills | Status: DC | PRN

## 2021-11-20 NOTE — Progress Notes (Signed)
Nursing Pain Medication Assessment:  Safety precautions to be maintained throughout the outpatient stay will include: orient to surroundings, keep bed in low position, maintain call bell within reach at all times, provide assistance with transfer out of bed and ambulation.  Medication Inspection Compliance: Pill count conducted under aseptic conditions, in front of the patient. Neither the pills nor the bottle was removed from the patient's sight at any time. Once count was completed pills were immediately returned to the patient in their original bottle.  Medication: Oxycodone/APAP Pill/Patch Count:  72 of 120 pills remain Pill/Patch Appearance: Markings consistent with prescribed medication Bottle Appearance: Standard pharmacy container. Clearly labeled. Filled Date: 8 / 22 / 2023 Last Medication intake:  Today

## 2021-11-20 NOTE — Progress Notes (Signed)
PROVIDER NOTE: Information contained herein reflects review and annotations entered in association with encounter. Interpretation of such information and data should be left to medically-trained personnel. Information provided to patient can be located elsewhere in the medical record under "Patient Instructions". Document created using STT-dictation technology, any transcriptional errors that may result from process are unintentional.    Patient: Lauren Greene  Service Category: E/M  Provider: Gillis Santa, MD  DOB: 12/17/1965  DOS: 11/20/2021  Specialty: Interventional Pain Management  MRN: [id number]  Setting: Ambulatory outpatient  PCP: Kathyrn Drown, MD  Type: Established Patient    Referring Provider: Kathyrn Drown, MD  Location: Office  Delivery: Face-to-face     HPI  Lauren Greene, a 56 y.o. year old female, is here today because of her Chronic pain syndrome [G89.4]. Ms. Brookshire primary complain today is Back Pain (lower) Last encounter: My last encounter with her was on 08/27/2021  Pertinent problems: Ms. Ungar has Periprosthetic fracture around internal prosthetic left knee joint; Chronic back pain; Morbid obesity ([place]); Lumbar degenerative disc disease; Chronic, continuous use of opioids; Fibromyalgia; Lumbar spondylosis; Chronic pain syndrome; and Encounter for long-term opiate analgesic use on their pertinent problem list. Pain Assessment: Severity of Chronic pain is reported as a 8 /10. Location: Back Lower/denies. Onset: More than a month ago. Quality: Aching, Constant. Timing: Constant. Modifying factor(s): "staying off my feet". Vitals:  height is _0  (1.727 m) and weight is 254 lb (115.2 kg). Her temporal temperature is 97.4 F (36.3 C) (abnormal). Her blood pressure is 144/90 (abnormal) and her pulse is 72. Her respiration is 18 and oxygen saturation is 98%.   Reason for encounter: medication management.    Patient presents today for medication management.   Unfortunately, patient sustained a fall last week and has bruising on her left hand and left shoulders.  She states that she lost balance given foot length discrepancy.  No new lumbar spine issues. Is not utilizing her spinal cord stimulator.  Is fairly discouraged with it.  She does not want me to reach out to [employer] representative for optimization. She is requesting the addition of a long-acting opioid analgesic which I recommended against.  I only recommend single agent opioid therapy. She states that she is trying to establish a visit with a foot surgeon at [facility].  Pharmacotherapy Assessment  Analgesic:  Percocet 10-20 mg BID (MME 30-60)    Monitoring: [place] PMP: PDMP reviewed during this encounter.       Pharmacotherapy: No side-effects or adverse reactions reported. Compliance: No problems identified. Effectiveness: Clinically acceptable.  Rise Patience, RN  11/20/2021  1:11 PM  Sign when Signing Visit Nursing Pain Medication Assessment:  Safety precautions to be maintained throughout the outpatient stay will include: orient to surroundings, keep bed in low position, maintain call bell within reach at all times, provide assistance with transfer out of bed and ambulation.  Medication Inspection Compliance: Pill count conducted under aseptic conditions, in front of the patient. Neither the pills nor the bottle was removed from the patient's sight at any time. Once count was completed pills were immediately returned to the patient in their original bottle.  Medication: Oxycodone/APAP Pill/Patch Count:  72 of 120 pills remain Pill/Patch Appearance: Markings consistent with prescribed medication Bottle Appearance: Standard pharmacy container. Clearly labeled. Filled Date: 8 / 22 / 2023 Last Medication intake:  Today     UDS:  Summary  Date Value Ref Range Status  04/24/2021 Note  Final  Comment:    ==================================================================== Tox[employer]SU[employer] Select  13 (MW) ==================================================================== Test                             Result       Flag       Units  Drug Present and Declared for Prescription Verification   Amphetamine                    >9615        EXPECTED   ng/mg creat    Amphetamine is available as a schedule II prescription drug.    Oxycodone                      2475         EXPECTED   ng/mg creat   Oxymorphone                    882          EXPECTED   ng/mg creat   Noroxycodone                   4996         EXPECTED   ng/mg creat   Noroxymorphone                 161          EXPECTED   ng/mg creat    Sources of oxycodone are scheduled prescription medications.    Oxymorphone, noroxycodone, and noroxymorphone are expected    metabolites of oxycodone. Oxymorphone is also available as a    scheduled prescription medication.  ==================================================================== Test                      Result    Flag   Units      Ref Range   Creatinine              104              mg/dL      >=20 ==================================================================== Declared Medications:  The flagging and interpretation on this report are based on the  following declared medications.  Unexpected results may arise from  inaccuracies in the declared medications.   **Note: The testing scope of this panel includes these medications:   Amphetamine (Adderall)  Oxycodone (Percocet)   **Note: The testing scope of this panel does not include the  following reported medications:   Acetaminophen (Percocet)  Adalimumab (Humira)  Amlodipine (Norvasc)  Diclofenac  Duloxetine (Cymbalta)  Lidocaine (Xylocaine)  Methocarbamol (Robaxin)  Nystatin  Omeprazole (Prilosec)  Trazodone (Desyrel) ==================================================================== For clinical consultation, please call [phone]. ====================================================================      ROS  Constitutional: Denies any fever or chills Gastrointestinal: No reported hemesis, hematochezia, vomiting, or acute GI distress Musculoskeletal:  Low back pain, right foot pain, right ankle pain, bilateral shoulder pain Neurological: No reported episodes of acute onset apraxia, aphasia, dysarthria, agnosia, amnesia, paralysis, loss of coordination, or loss of consciousness  Medication Review  DULoxetine, Upadacitinib ER, amLODipine, amphetamine-dextroamphetamine, lidocaine, methocarbamol, nystatin, omeprazole, oxyCODONE-acetaminophen, rosuvastatin, and silver sulfADIAZINE  History Review  Allergy: Ms. Linenberger is allergic to wellbutrin [bupropion] and lyrica [pregabalin]. Drug: Ms. Schrum  reports no history of drug use. Alcohol:  reports current alcohol use. Tobacco:  reports that she quit smoking about 21 years ago. Her smoking use included cigarettes. She has a 20.00  pack-year smoking history. She has never used smokeless tobacco. Social: Ms. Rosenau  reports that she quit smoking about 21 years ago. Her smoking use included cigarettes. She has a 20.00 pack-year smoking history. She has never used smokeless tobacco. She reports current alcohol use. She reports that she does not use drugs. Medical:  has a past medical history of ADHD (attention deficit hyperactivity disorder), Ankylosing spondylitis ([place]), Arthritis, Back pain, Broken femur ([place]) (08/27/2012), Chronic pain, GERD (gastroesophageal reflux disease), Heart murmur, History of methicillin resistant staphylococcus aureus (MRSA) (2009), Hypertension, Migraine, Osteoarthritis, PVC (premature ventricular contraction), Raynaud disease, and Raynaud phenomenon (04/06/2018). Surgical: Ms. Quigley  has a past surgical history that includes Total knee arthroplasty (Bilateral); Heel spur surgery; Back surgery; IM nailing femoral shaft fracture (Left,  08/27/2012); Femur IM nail (Left, 08/27/2012); Cervical biopsy w/ loop electrode excision (1990's); Endometrial biopsy (04-05-05 and 02-12-06); Tubal ligation; Shoulder surgery (Left); Vaginal hysterectomy (2018); Foot arthrodesis (Right, 02/01/2020); Achilles tendon lengthening (Right, 02/01/2020); Knee surgery; Bunionectomy; Spinal cord stimulator insertion (N/A, 02/26/2021); Hammer toe surgery (Right); Osteotomy; Bladder surgery; and Arthrodesis (Right). Family: family history includes Bipolar disorder in her mother and sister; Breast cancer in her mother; Cancer in her father, mother, and another family member; Heart disease in her father; Heart failure in her father and another family member.  Laboratory Chemistry Profile   Renal Lab Results  Component Value Date   BUN 18 08/24/2021   CREATININE 0.73 25/85/2778   BCR NOT APPLICABLE 24/23/5361   GFRAA 115 08/30/2019   GFRNONAA >60 02/26/2021    Hepatic Lab Results  Component Value Date   AST 21 08/24/2021   ALT 15 08/24/2021   ALBUMIN 4.4 09/13/2020   ALKPHOS 123 (H) 09/13/2020   LIPASE 25 01/12/2016    Electrolytes Lab Results  Component Value Date   NA 141 08/24/2021   K 5.0 08/24/2021   CL 105 08/24/2021   CALCIUM 9.4 08/24/2021    Bone Lab Results  Component Value Date   VD25OH 31 01/07/2013    Inflammation (CRP: Acute Phase) (ESR: Chronic Phase) Lab Results  Component Value Date   CRP 2 09/13/2020   ESRSEDRATE 4 04/06/2018         Note: Above Lab results reviewed.  Recent Imaging Review  XR Hand 2 View Left X-ray left hand 2 views  Extensive cystic and solid probable erosive changes in multiple carpal  joints with subluxation and invagination.  Moderately advanced first CMC  joint degenerative changes and MCP hyperextension.  Advanced third MCP  joint space narrowing with lateral osteophyte other MCPs appear normal.   PIP and DIP joint space with some asymmetric narrowing and lateral  osteophyte formation  in second and third digits.  Impression  Chronic inflammatory appearing wrist joint changes with probable erosion  and severe subluxation, moderately advanced osteoarthritis changes in  thumb and second and third fingers distally XR Hand 2 View Right X-ray right hand 2 views  Radiocarpal joint space appears normal.  Mild degenerative changes base of  thumb.  Extensive joint space loss at second and third MCP joints with  subchondral cysts also articular surface probable erosion or partial joint  fusion.  Lateral osteophyte formation and asymmetric joint space narrowing  at PIP and DIPs.  Bone mineralization with possible generalized  osteopenia.  Impression  Chronic erosive inflammatory arthritis changes most severe in second and  third MCP joints and with generalized osteopenia, moderate to severe DIP  joint osteoarthritis changes Note: Reviewed  Physical Exam  General appearance: Well nourished, well developed, and well hydrated. In no apparent acute distress Mental status: Alert, oriented x 3 (person, place, & time)       Respiratory: No evidence of acute respiratory distress Eyes: PERLA Vitals: BP (!) 144/90   Pulse 72   Temp (!) 97.4 F (36.3 C) (Temporal)   Resp 18   Ht _0  (1.727 m)   Wt 254 lb (115.2 kg)   LMP 03/19/2003   SpO2 98%   BMI 38.62 kg/m  BMI: Estimated body mass index is 38.62 kg/m as calculated from the following:   Height as of this encounter: _1  (1.727 m).   Weight as of this encounter: 254 lb (115.2 kg). Ideal: Ideal body weight: 63.9 kg (140 lb 14 oz) Adjusted ideal body weight: 84.4 kg (186 lb 2 oz)   Thoracic Spine Area Exam  Skin & Axial Inspection: Well healed scar from previous spine surgery detected Alignment: Symmetrical Functional ROM: Unrestricted ROM Stability: No instability detected Muscle Tone/Strength: Functionally intact. No obvious neuro-muscular anomalies detected. Sensory (Neurological): Unimpaired Muscle  strength & Tone: No palpable anomalies  Lumbar Spine Area Exam  Skin & Axial Inspection: Well healed scar from previous spine surgery detected Alignment: Symmetrical Functional ROM: Unrestricted ROM       Stability: No instability detected Muscle Tone/Strength: Functionally intact. No obvious neuro-muscular anomalies detected. Sensory (Neurological): Unimpaired Palpation:  IPG present         Gait & Posture Assessment  Ambulation: Unassisted Gait: Relatively normal for age and body habitus Posture: WNL  Lower Extremity Exam    Side: Right lower extremity  Side: Left lower extremity  Stability: No instability observed          Stability: No instability observed          Skin & Extremity Inspection: Skin color, temperature, and hair growth are WNL. No peripheral edema or cyanosis. No masses, redness, swelling, asymmetry, or associated skin lesions. No contractures.  Skin & Extremity Inspection: Skin color, temperature, and hair growth are WNL. No peripheral edema or cyanosis. No masses, redness, swelling, asymmetry, or associated skin lesions. No contractures.  Functional ROM: Pain restricted ROM  right ankle and foot          Functional ROM: Unrestricted ROM                  Muscle Tone/Strength: Functionally intact. No obvious neuro-muscular anomalies detected.  Muscle Tone/Strength: Functionally intact. No obvious neuro-muscular anomalies detected.  Sensory (Neurological): Neurogenic pain pattern        Sensory (Neurological): Unimpaired        DTR: Patellar: deferred today Achilles: deferred today Plantar: deferred today  DTR: Patellar: deferred today Achilles: deferred today Plantar: deferred today  Palpation: No palpable anomalies  Palpation: No palpable anomalies    Assessment   Status Diagnosis  Persistent Persistent Persistent 1. Chronic pain syndrome   2. S/P lumbar microdiscectomy (left L4/5 with micordissection)   3. Spinal stenosis, lumbar region, with neurogenic  claudication   4. Failed back surgical syndrome   5. Encounter for long-term opiate analgesic use   6. Chronic radicular lumbar pain   7. Lumbar radiculopathy   8. Other intervertebral disc degeneration, lumbar region   9. Lumbar facet arthropathy   10. Chronic pain of both shoulders         Plan of Care    Ms. JOLAYNE BRANSON has a current medication list which includes the following long-term  medication(s): amlodipine, amphetamine-dextroamphetamine, rosuvastatin, and rinvoq.  Pharmacotherapy (Medications Ordered): Meds ordered this encounter  Medications   oxyCODONE-acetaminophen (PERCOCET) 10-325 MG tablet    Sig: Take 1-2 tablets by mouth every 12 (twelve) hours as needed for pain. Must last 30 days.    Dispense:  120 tablet    Refill:  0    Chronic Pain: STOP Act (Not applicable) Fill 1 day early if closed on refill date. Avoid benzodiazepines within 8 hours of opioids   oxyCODONE-acetaminophen (PERCOCET) 10-325 MG tablet    Sig: Take 1-2 tablets by mouth every 12 (twelve) hours as needed for pain. Must last 30 days.    Dispense:  120 tablet    Refill:  0    Chronic Pain: STOP Act (Not applicable) Fill 1 day early if closed on refill date. Avoid benzodiazepines within 8 hours of opioids   oxyCODONE-acetaminophen (PERCOCET) 10-325 MG tablet    Sig: Take 1-2 tablets by mouth every 12 (twelve) hours as needed for pain. Must last 30 days.    Dispense:  120 tablet    Refill:  0    Chronic Pain: STOP Act (Not applicable) Fill 1 day early if closed on refill date. Avoid benzodiazepines within 8 hours of opioids   methocarbamol (ROBAXIN) 750 MG tablet    Sig: Take 1 tablet (750 mg total) by mouth every 8 (eight) hours as needed for muscle spasms.    Dispense:  90 tablet    Refill:  5   Orders:  Orders Placed This Encounter  Procedures   ToxASSURE Select 13 (MW), Urine    Volume: 30 ml(s). Minimum 3 ml of urine is needed. Document temperature of fresh sample. Indications:  Long term (current) use of opiate analgesic [id number])    Order Specific Question:   Release to patient    Answer:   Immediate   Follow-up plan:   Return in about 3 months (around 02/19/2022) for Medication Management, in person.    Recent Visits Date Type Provider Dept  08/27/21 Office Visit Gillis Santa, MD [facility]  Showing recent visits within past 90 days and meeting all other requirements Today's Visits Date Type Provider Dept  11/20/21 Office Visit Gillis Santa, MD [facility]  Showing today's visits and meeting all other requirements Future Appointments No visits were found meeting these conditions. Showing future appointments within next 90 days and meeting all other requirements  I discussed the assessment and treatment plan with the patient. The patient was provided an opportunity to ask questions and all were answered. The patient agreed with the plan and demonstrated an understanding of the instructions.  Patient advised to call back or seek an in-person evaluation if the symptoms or condition worsens.  Duration of encounter: 19mnutes.  Note by: BGillis Santa MD Date: 11/20/2021; Time: 1:25 PM

## 2021-11-24 LAB — TOXASSURE SELECT 13 (MW), URINE

## 2021-12-04 ENCOUNTER — Ambulatory Visit (INDEPENDENT_AMBULATORY_CARE_PROVIDER_SITE_OTHER): Payer: PPO | Admitting: Family Medicine

## 2021-12-04 VITALS — BP 134/76

## 2021-12-04 DIAGNOSIS — E785 Hyperlipidemia, unspecified: Secondary | ICD-10-CM | POA: Diagnosis not present

## 2021-12-04 DIAGNOSIS — M79671 Pain in right foot: Secondary | ICD-10-CM | POA: Diagnosis not present

## 2021-12-04 DIAGNOSIS — I10 Essential (primary) hypertension: Secondary | ICD-10-CM

## 2021-12-04 DIAGNOSIS — F325 Major depressive disorder, single episode, in full remission: Secondary | ICD-10-CM

## 2021-12-04 DIAGNOSIS — F988 Other specified behavioral and emotional disorders with onset usually occurring in childhood and adolescence: Secondary | ICD-10-CM | POA: Diagnosis not present

## 2021-12-04 DIAGNOSIS — I739 Peripheral vascular disease, unspecified: Secondary | ICD-10-CM | POA: Diagnosis not present

## 2021-12-04 HISTORY — DX: Peripheral vascular disease, unspecified: I73.9

## 2021-12-04 MED ORDER — AMPHETAMINE-DEXTROAMPHET ER 30 MG PO CP24: 30.0000 mg | ORAL_CAPSULE | ORAL | 0 refills | Status: DC

## 2021-12-04 MED ORDER — AMPHETAMINE-DEXTROAMPHET ER 30 MG PO CP24: ORAL_CAPSULE | ORAL | 0 refills | Status: DC

## 2021-12-04 MED ORDER — AMLODIPINE BESYLATE 2.5 MG PO TABS: 2.5000 mg | ORAL_TABLET | Freq: Every day | ORAL | 1 refills | Status: DC

## 2021-12-04 MED ORDER — OMEPRAZOLE 40 MG PO CPDR: 40.0000 mg | DELAYED_RELEASE_CAPSULE | Freq: Every day | ORAL | 1 refills | Status: DC

## 2021-12-04 MED ORDER — ROSUVASTATIN CALCIUM 10 MG PO TABS: 10.0000 mg | ORAL_TABLET | Freq: Every day | ORAL | 1 refills | Status: DC

## 2021-12-04 NOTE — Patient Instructions (Signed)
  Hi Carnesha  It was good to see you today We will send in the refills on your medications We will also process the referral And help set up the coronary calcium test  See you in 3 months [place]

## 2021-12-04 NOTE — Progress Notes (Signed)
Subjective:    Patient ID: Lauren Greene, female    DOB: 1965/09/13, 56 y.o.   MRN: [id number]  HPI Patient here today for follow-up regarding blood pressure as well as ADD  This patient has adult ADD. Takes medication responsibly. Medication does help the patient focus in be more functional. Patient relates that they are or not abusing the medication or misusing the medication. The patient understands that if they're having any negative side effects such as elevated high blood pressure severe headaches they would need stop the medication follow-up immediately. They also understand that the prescriptions are to last for 3 months then the patient will need to follow-up before having further prescriptions.  Patient compliance good  Does medication help patient function /attention better allows her to stay focused and more attentive to the days events and her work  Side effects denies side effects Had U DT recently which was legitimate  Patient for blood pressure check up.  The patient does have hypertension.   Patient relates dietary measures try to minimize salt The importance of healthy diet and activity were discussed Patient relates compliance  Patient here for follow-up regarding cholesterol.    Patient relates taking medication on a regular basis Denies problems with medication Importance of dietary measures discussed Regular lab work regarding lipid and liver was checked and if needing additional labs was appropriately ordered     Review of Systems     Objective:   Physical Exam General-in no acute distress Eyes-no discharge Lungs-respiratory rate normal, CTA CV-no murmurs,RRR Extremities skin warm dry no edema Neuro grossly normal Behavior normal, alert  Exam she does have shortening of the right foot due to previous surgery as well as unusual underneath portion of her foot that she states is a ligament that is causing her severe pain she is hoping that further  surgery would help this circulation seems to be fine to the foot      Assessment & Plan:  1. Right foot pain She has had 2 surgeries unfortunately has severe chronic pain she is interested in seeing a orthopedic specialist at [facility] we will go ahead with make a referral there it is medically necessary for her to get this opinion  2. Essential hypertension HTN- patient seen for follow-up regarding HTN.   Diet, medication compliance, appropriate labs and refills were completed.   Importance of keeping blood pressure under good control to lessen the risk of complications discussed Regular follow-up visits discussed Healthy diet regular activity recommended - rosuvastatin (CRESTOR) 10 MG tablet; Take 1 tablet (10 mg total) by mouth daily.  Dispense: 90 tablet; Refill: 1  3. Attention deficit disorder, unspecified hyperactivity presence The patient was seen today as part of the visit regarding ADD.  Patient is stable on current regimen.  Appropriate prescriptions prescribed.  Medications were reviewed with the patient as well as compliance. Side effects were checked for. Discussion regarding effectiveness was held. Prescriptions were electronically sent in.  Patient reminded to follow-up in approximately 3 months.   Plans to [facility] law with drug registry was checked and verified while present with the patient. Doing well with ADD medicine please continue  4. Hyperlipidemia, unspecified hyperlipidemia type Hyperlipidemia-importance of diet, weight control, activity, compliance with medications discussed.   Recent labs reviewed.   Any additional labs or refills ordered.   Importance of keeping under good control discussed. Regular follow-up visits discussed  Patient does have morbid obesity it is very difficult for her to lose  weight because of lack of exercise because of chronic foot pain portion control and fitting in as much activity as possible that tolerates  Circulation issues in  the toes no sign of any type of vascular ulceration currently  Depression is in remission she is taking the Cymbalta as she is certainly feeling down because of her chronic pain but states that she is not depressed - rosuvastatin (CRESTOR) 10 MG tablet; Take 1 tablet (10 mg total) by mouth daily.  Dispense: 90 tablet; Refill: 1

## 2021-12-05 ENCOUNTER — Other Ambulatory Visit: Payer: Self-pay

## 2021-12-05 ENCOUNTER — Ambulatory Visit: Payer: PPO | Admitting: Family Medicine

## 2021-12-05 DIAGNOSIS — M79671 Pain in right foot: Secondary | ICD-10-CM

## 2021-12-07 ENCOUNTER — Other Ambulatory Visit: Payer: Self-pay | Admitting: *Deleted

## 2021-12-07 DIAGNOSIS — I10 Essential (primary) hypertension: Secondary | ICD-10-CM

## 2021-12-07 DIAGNOSIS — M79671 Pain in right foot: Secondary | ICD-10-CM

## 2021-12-07 DIAGNOSIS — E785 Hyperlipidemia, unspecified: Secondary | ICD-10-CM

## 2021-12-15 ENCOUNTER — Other Ambulatory Visit: Payer: Self-pay | Admitting: Family Medicine

## 2021-12-17 NOTE — Telephone Encounter (Signed)
Please confirm with patient that she is continuing to take this medication.  If so she may have 3 months of refills

## 2021-12-18 ENCOUNTER — Other Ambulatory Visit: Payer: Self-pay | Admitting: Family Medicine

## 2021-12-18 ENCOUNTER — Telehealth: Payer: Self-pay | Admitting: Family Medicine

## 2021-12-18 MED ORDER — DICLOFENAC SODIUM 75 MG PO TBEC: 75.0000 mg | DELAYED_RELEASE_TABLET | Freq: Two times a day (BID) | ORAL | 5 refills | Status: DC

## 2021-12-18 NOTE — Telephone Encounter (Signed)
Patient advised of provider's recommendations and verbalized understanding and would like to proceed with the new dose of 75 mg twice a day. Prescription sent electronically to pharmacy.

## 2021-12-18 NOTE — Telephone Encounter (Signed)
Please advise. Thank you

## 2021-12-18 NOTE — Telephone Encounter (Signed)
Nurses-even though in the past she has taken the 100 mg 2 daily Current literature states that 150 mg should be the maximum dose There is a increased risk of thromboembolic events as well as cardiovascular events and GI bleeds with higher dose Therefore due to safety we have to stick with the 75 mg twice daily I certainly the understand she is in a lot of pain and I am trying to be sympathetic as best as possible but at the same time balancing safety for the sake of the patient If she would like to have this sent and we can send in diclofenac 75 mg 1 twice daily, #60, 5 refills

## 2021-12-18 NOTE — Telephone Encounter (Signed)
Patient is requesting new prescription for diclofenac 200 mg  this is not on her current medication list it was last done in 2017 and it was 100 mg had to go through her history of medication to find.She states completely out and needs it. Please advise [employer]

## 2021-12-20 ENCOUNTER — Other Ambulatory Visit (HOSPITAL_COMMUNITY): Payer: Self-pay

## 2021-12-24 ENCOUNTER — Other Ambulatory Visit (HOSPITAL_COMMUNITY): Payer: Self-pay

## 2021-12-26 ENCOUNTER — Other Ambulatory Visit: Payer: Self-pay | Admitting: Internal Medicine

## 2021-12-26 ENCOUNTER — Other Ambulatory Visit (HOSPITAL_COMMUNITY): Payer: Self-pay

## 2021-12-26 ENCOUNTER — Other Ambulatory Visit: Payer: Self-pay

## 2021-12-26 DIAGNOSIS — Z79899 Other long term (current) drug therapy: Secondary | ICD-10-CM

## 2021-12-26 DIAGNOSIS — M450 Ankylosing spondylitis of multiple sites in spine: Secondary | ICD-10-CM

## 2021-12-26 NOTE — Telephone Encounter (Signed)
Next Visit: 01/01/2022  Last Visit: 09/05/2021  Last Fill: 09/24/2021  DX:Ankylosing spondylitis of multiple sites in spine  Current Dose per office note 09/05/2021: Rinvoq 15 mg PO daily   Labs: 08/24/2021 Lipid Panel WNL CMP WNL CBC WNL  TB Gold: 08/24/2021 Neg   Patient came in today (12/26/21) to update labs.  Okay to refill Rinvoq?

## 2021-12-27 ENCOUNTER — Other Ambulatory Visit (HOSPITAL_COMMUNITY): Payer: Self-pay

## 2021-12-27 LAB — COMPLETE METABOLIC PANEL WITH GFR
AG Ratio: 1.8 (calc) (ref 1.0–2.5)
ALT: 18 U/L (ref 6–29)
AST: 23 U/L (ref 10–35)
Albumin: 4.3 g/dL (ref 3.6–5.1)
Alkaline phosphatase (APISO): 117 U/L (ref 37–153)
BUN: 17 mg/dL (ref 7–25)
CO2: 29 mmol/L (ref 20–32)
Calcium: 9.1 mg/dL (ref 8.6–10.4)
Chloride: 105 mmol/L (ref 98–110)
Creat: 0.7 mg/dL (ref 0.50–1.03)
Globulin: 2.4 g/dL (calc) (ref 1.9–3.7)
Glucose, Bld: 86 mg/dL (ref 65–99)
Potassium: 4.5 mmol/L (ref 3.5–5.3)
Sodium: 141 mmol/L (ref 135–146)
Total Bilirubin: 0.4 mg/dL (ref 0.2–1.2)
Total Protein: 6.7 g/dL (ref 6.1–8.1)
eGFR: 101 mL/min/{1.73_m2} (ref >=60)

## 2021-12-27 LAB — CBC WITH DIFFERENTIAL/PLATELET
Absolute Monocytes: 387 cells/uL (ref 200–950)
Basophils Absolute: 40 cells/uL (ref 0–200)
Basophils Relative: 0.9 %
Eosinophils Absolute: 22 cells/uL (ref 15–500)
Eosinophils Relative: 0.5 %
HCT: 37.5 % (ref 35.0–45.0)
Hemoglobin: 12.4 g/dL (ref 11.7–15.5)
Lymphs Abs: 2165 cells/uL (ref 850–3900)
MCH: 28.7 pg (ref 27.0–33.0)
MCHC: 33.1 g/dL (ref 32.0–36.0)
MCV: 86.8 fL (ref 80.0–100.0)
MPV: 9.9 fL (ref 7.5–12.5)
Monocytes Relative: 8.8 %
Neutro Abs: 1786 cells/uL (ref 1500–7800)
Neutrophils Relative %: 40.6 %
Platelets: 251 10*3/uL (ref 140–400)
RBC: 4.32 10*6/uL (ref 3.80–5.10)
RDW: 15 % (ref 11.0–15.0)
Total Lymphocyte: 49.2 %
WBC: 4.4 10*3/uL (ref 3.8–10.8)

## 2021-12-27 LAB — LIPID PANEL
Cholesterol: 151 mg/dL (ref <200)
HDL: 66 mg/dL (ref >=50)
LDL Cholesterol (Calc): 68 mg/dL (calc)
Non-HDL Cholesterol (Calc): 85 mg/dL (calc) (ref <130)
Total CHOL/HDL Ratio: 2.3 (calc) (ref <5.0)
Triglycerides: 89 mg/dL (ref <150)

## 2021-12-27 MED ORDER — RINVOQ 15 MG PO TB24
15.0000 mg | ORAL_TABLET | Freq: Every day | ORAL | 2 refills | Status: DC
  Filled 2021-12-27: qty 30, 30d supply, fill #0

## 2021-12-27 NOTE — Progress Notes (Deleted)
Office Visit Note  Patient: Lauren Greene             Date of Birth: [birth year]           MRN: [id number]             PCP: Kathyrn Drown, MD Referring: Kathyrn Drown, MD Visit Date: 01/01/2022   Subjective:  No chief complaint on file.   History of Present Illness: Lauren Greene is a 56 y.o. female here for follow up for follow up with history of ankylosing spondylitis and chronic pain of multiple areas both axial disease and peripheral joint pains   Previous HPI 09/05/2021  Lauren Greene is a 56 y.o. female here for follow up with history of ankylosing spondylitis and chronic pain of multiple areas both axial disease and peripheral joint pains. Xrays at initial visit showing changes of OA also concerning for MCP joint erosion. Baseline labs for considering trial of rinvoq were normal. She continues with similar symptoms of joint pains also prominent stiffness symptoms.    Previous HPI 08/24/21 Lauren Greene is a 56 y.o. female here for osteoarthritis with chronic pain of multiple areas and history of ankylosing spondylitis. She was previously seen at [facility] most recently 01/2021 tried Humira last year. Some active peripheral arthritis was reported at those encounters, in addition to chronic back pain and some generalized pain. She has had multiple orthopedic surgery and seeing neurosurgery for spinal stimulator placement   DMARD Hx Humira 01/2021   No Rheumatology ROS completed.   PMFS History:  Patient Active Problem List   Diagnosis Date Noted   PAD (peripheral artery disease) ([place]) 12/04/2021   High risk medication use 08/24/2021   Spinal stenosis, lumbar region, with neurogenic claudication 07/17/2021   Localized osteoarthritis of shoulder regions, bilateral 04/24/2021   Chronic pain of both shoulders 04/24/2021   Failed back surgical syndrome 04/24/2021   S/P lumbar microdiscectomy (left L4/5 with micordissection) 04/24/2021    Ankylosing spondylitis ([place]) 01/08/2021   HLA B27 positive 01/08/2021   Primary osteoarthritis of right ankle 11/23/2019   Essential hypertension 08/30/2019   Chronic radicular lumbar pain 08/23/2019   Other hyperlipidemia 11/16/2018   Foot pain, bilateral 06/04/2018   Raynaud phenomenon 04/06/2018   Overactive bladder 04/06/2018   Cellulitis of toe, right 04/01/2018   Chilblains 04/01/2018   Encounter for long-term opiate analgesic use 03/30/2018   Marijuana use 01/29/2018   Lumbar spondylosis 10/22/2017   Lumbar facet arthropathy 10/22/2017   Chronic pain syndrome 10/22/2017   Lumbar degenerative disc disease 01/06/2017   GERD (gastroesophageal reflux disease) 05/27/2016   Chronic, continuous use of opioids 05/27/2016   Fibromyalgia 05/27/2016   Moderate episode of recurrent major depressive disorder ([place]) 02/01/2016   Hyperhydrosis disorder 10/24/2015   Osteoarthritis 08/16/2015   Morbid obesity ([place]) 08/07/2015   Elevated blood pressure 01/31/2014   Arthritis 06/24/2013   Myalgia and myositis 06/24/2013   Chronic back pain 02/16/2013   Depression, major, in remission ([place]) 01/05/2013   Chronic arthralgias of knees and hips 01/05/2013   Periprosthetic fracture around internal prosthetic left knee joint 08/27/2012   Attention deficit disorder 07/08/2012    Past Medical History:  Diagnosis Date   ADHD (attention deficit hyperactivity disorder)    Ankylosing spondylitis ([place])    Arthritis    Back pain    Broken femur ([place]) 08/27/2012   Chronic pain    chronic opioid use   GERD (gastroesophageal reflux disease)  Heart murmur    asymptomatic   History of methicillin resistant staphylococcus aureus (MRSA) 2009   Hypertension    Migraine    Osteoarthritis    PVC (premature ventricular contraction)    Raynaud disease    Raynaud phenomenon 04/06/2018   [facility] diagnosed 2020    Family History  Problem Relation Age of Onset   Cancer Mother    Breast cancer  Mother    Bipolar disorder Mother    Heart failure Father    Cancer Father    Heart disease Father    Bipolar disorder Sister    Cancer Other    Heart failure Other    Past Surgical History:  Procedure Laterality Date   ACHILLES TENDON LENGTHENING Right 02/01/2020   Procedure: ACHILLES TENDON LENGTHENING;  Surgeon: Erle Crocker, MD;  Location: [facility] [facility];  Service: Orthopedics;  Laterality: Right;   ARTHRODESIS Right    BACK SURGERY     BLADDER SURGERY     BUNIONECTOMY     CERVICAL BIOPSY  W/ LOOP ELECTRODE EXCISION  1990's   in [place], [place] BIOPSY  04-05-05 and 02-12-06   04-05-05 benign but too rapid growth. RXd Provera and repeat Bx 44mo. 02-12-06 Bx benign--Dr. Edwinna Areola   FEMUR IM NAIL Left 08/27/2012   Procedure: INTRAMEDULLARY (IM) RETROGRADE FEMORAL NAILING;  Surgeon: Mcarthur Rossetti, MD;  Location: [place];  Service: Orthopedics;  Laterality: Left;   FOOT ARTHRODESIS Right 02/01/2020   Procedure: TRIPLE ARTHRODESIS,  TENDO ACHILLES LENGTHENING;  Surgeon: Erle Crocker, MD;  Location: [place];  Service: Orthopedics;  Laterality: Right;  LENGTH OF SURGERY: 4 HOURS   HAMMER TOE SURGERY Right    HEEL SPUR SURGERY     IM NAILING FEMORAL SHAFT FRACTURE Left 08/27/2012   Dr Ninfa Linden   KNEE SURGERY     multiple   OSTEOTOMY     SHOULDER SURGERY Left    SPINAL CORD STIMULATOR INSERTION N/A 02/26/2021   Procedure: THORACIC SPINAL CORD STIMULATOR PLACEMENT, PULSE GENERATOR;  Surgeon: Deetta Perla, MD;  Location: [facility];  Service: Neurosurgery;  Laterality: N/A;   TOTAL KNEE ARTHROPLASTY Bilateral    2009 and 2012   TUBAL LIGATION     VAGINAL HYSTERECTOMY  2018   Social History   Social History Narrative   Not on file   Immunization History  Administered Date(s) Administered   Moderna Sars-Covid-2 Vaccination 05/22/2019, 06/23/2019   Td 08/03/2013   Zoster Recombinat (Shingrix) 01/10/2018, 04/06/2018      Objective: Vital Signs: LMP 03/19/2003    Physical Exam   Musculoskeletal Exam: ***  CDAI Exam: CDAI Score: -- Patient Global: --; Provider Global: -- Swollen: --; Tender: -- Joint Exam 01/01/2022   No joint exam has been documented for this visit   There is currently no information documented on the homunculus. Go to the Rheumatology activity and complete the homunculus joint exam.  Investigation: No additional findings.  Imaging: No results found.  Recent Labs: Lab Results  Component Value Date   WBC 4.4 12/26/2021   HGB 12.4 12/26/2021   PLT 251 12/26/2021   NA 141 12/26/2021   K 4.5 12/26/2021   CL 105 12/26/2021   CO2 29 12/26/2021   GLUCOSE 86 12/26/2021   BUN 17 12/26/2021   CREATININE 0.70 12/26/2021   BILITOT 0.4 12/26/2021   ALKPHOS 123 (H) 09/13/2020   AST 23 12/26/2021   ALT 18 12/26/2021   PROT 6.7 12/26/2021  ALBUMIN 4.4 09/13/2020   CALCIUM 9.1 12/26/2021   GFRAA 115 08/30/2019   QFTBGOLDPLUS NEGATIVE 08/24/2021    Speciality Comments: No specialty comments available.  Procedures:  No procedures performed Allergies: Wellbutrin [bupropion] and Lyrica [pregabalin]   Assessment / Plan:     Visit Diagnoses: No diagnosis found.  ***  Orders: No orders of the defined types were placed in this encounter.  No orders of the defined types were placed in this encounter.    Follow-Up Instructions: No follow-ups on file.   Bertram Savin, RT  Note - This record has been created using [employer].  Chart creation errors have been sought, but may not always  have been located. Such creation errors do not reflect on  the standard of medical care.

## 2021-12-27 NOTE — Progress Notes (Signed)
Blood count and liver function and cholesterol all look good no problems for continuing the rinvoq.

## 2022-01-01 ENCOUNTER — Ambulatory Visit: Payer: PPO | Admitting: Internal Medicine

## 2022-01-01 DIAGNOSIS — M450 Ankylosing spondylitis of multiple sites in spine: Secondary | ICD-10-CM

## 2022-01-01 DIAGNOSIS — G894 Chronic pain syndrome: Secondary | ICD-10-CM

## 2022-01-01 DIAGNOSIS — M1990 Unspecified osteoarthritis, unspecified site: Secondary | ICD-10-CM

## 2022-01-01 DIAGNOSIS — Z79899 Other long term (current) drug therapy: Secondary | ICD-10-CM

## 2022-01-04 NOTE — Progress Notes (Signed)
Office Visit Note  Patient: Lauren Greene             Date of Birth: 1965/08/19           MRN: [id number]             PCP: Kathyrn Drown, MD Referring: Kathyrn Drown, MD Visit Date: 01/09/2022   Subjective:  Follow-up (Patient wants to discuss medication options. )   History of Present Illness: Lauren Greene is a 56 y.o. female here for follow up with history of ankylosing spondylitis and chronic pain of multiple areas both axial disease and peripheral joint pains. She started rinvoq for treatment now on this almost 4 months and so far does not notice a large difference. She also reports feeling hot all over some of the time with this medicine. Pain is very bad and interferes with activity and sleep. She has right foot pain plans to follow up with [facility] about surgery options for this. Pain all over most of the time, left wrist remains very achy even while in bed at night and sometimes wakes her.  Previous HPI 09/05/2021 Lauren Greene is a 56 y.o. female here for follow up with history of ankylosing spondylitis and chronic pain of multiple areas both axial disease and peripheral joint pains. Xrays at initial visit showing changes of OA also concerning for MCP joint erosion. Baseline labs for considering trial of rinvoq were normal. She continues with similar symptoms of joint pains also prominent stiffness symptoms.    Previous HPI 08/24/21 Lauren Greene is a 56 y.o. female here for osteoarthritis with chronic pain of multiple areas and history of ankylosing spondylitis. She was previously seen at [facility] most recently 01/2021 tried Humira last year. Some active peripheral arthritis was reported at those encounters, in addition to chronic back pain and some generalized pain. She has had multiple orthopedic surgery and seeing neurosurgery for spinal stimulator placement   DMARD Hx Humira 01/2021   Review of Systems  Constitutional:  Positive for fatigue.   HENT:  Positive for mouth dryness. Negative for mouth sores.   Eyes:  Positive for dryness.  Respiratory:  Negative for shortness of breath.   Cardiovascular:  Negative for chest pain and palpitations.  Gastrointestinal:  Positive for constipation. Negative for blood in stool and diarrhea.  Endocrine: Positive for increased urination.  Genitourinary:  Negative for involuntary urination.  Musculoskeletal:  Positive for joint pain, gait problem, joint pain, joint swelling, myalgias, muscle weakness, morning stiffness, muscle tenderness and myalgias.  Skin:  Negative for color change, rash, hair loss and sensitivity to sunlight.  Allergic/Immunologic: Negative for susceptible to infections.  Neurological:  Negative for dizziness and headaches.  Hematological:  Negative for swollen glands.  Psychiatric/Behavioral:  Positive for sleep disturbance. Negative for depressed mood. The patient is nervous/anxious.     PMFS History:  Patient Active Problem List   Diagnosis Date Noted   PAD (peripheral artery disease) ([place]) 12/04/2021   High risk medication use 08/24/2021   Spinal stenosis, lumbar region, with neurogenic claudication 07/17/2021   Localized osteoarthritis of shoulder regions, bilateral 04/24/2021   Chronic pain of both shoulders 04/24/2021   Failed back surgical syndrome 04/24/2021   S/P lumbar microdiscectomy (left L4/5 with micordissection) 04/24/2021   Ankylosing spondylitis ([place]) 01/08/2021   HLA B27 positive 01/08/2021   Primary osteoarthritis of right ankle 11/23/2019   Essential hypertension 08/30/2019   Chronic radicular lumbar pain 08/23/2019   Other hyperlipidemia 11/16/2018  Foot pain, bilateral 06/04/2018   Raynaud phenomenon 04/06/2018   Overactive bladder 04/06/2018   Cellulitis of toe, right 04/01/2018   Chilblains 04/01/2018   Encounter for long-term opiate analgesic use 03/30/2018   Marijuana use 01/29/2018   Lumbar spondylosis 10/22/2017   Lumbar facet  arthropathy 10/22/2017   Chronic pain syndrome 10/22/2017   Lumbar degenerative disc disease 01/06/2017   GERD (gastroesophageal reflux disease) 05/27/2016   Chronic, continuous use of opioids 05/27/2016   Fibromyalgia 05/27/2016   Moderate episode of recurrent major depressive disorder ([place]) 02/01/2016   Hyperhydrosis disorder 10/24/2015   Osteoarthritis 08/16/2015   Morbid obesity ([place]) 08/07/2015   Elevated blood pressure 01/31/2014   Arthritis 06/24/2013   Myalgia and myositis 06/24/2013   Chronic back pain 02/16/2013   Depression, major, in remission ([place]) 01/05/2013   Chronic arthralgias of knees and hips 01/05/2013   Periprosthetic fracture around internal prosthetic left knee joint 08/27/2012   Attention deficit disorder 07/08/2012    Past Medical History:  Diagnosis Date   ADHD (attention deficit hyperactivity disorder)    Ankylosing spondylitis ([place])    Arthritis    Back pain    Broken femur ([place]) 08/27/2012   Chronic pain    chronic opioid use   GERD (gastroesophageal reflux disease)    Heart murmur    asymptomatic   History of methicillin resistant staphylococcus aureus (MRSA) 2009   Hypertension    Migraine    Osteoarthritis    PVC (premature ventricular contraction)    Raynaud disease    Raynaud phenomenon 04/06/2018   [facility] diagnosed 2020    Family History  Problem Relation Age of Onset   Cancer Mother    Breast cancer Mother    Bipolar disorder Mother    Heart failure Father    Cancer Father    Heart disease Father    Bipolar disorder Sister    Cancer Other    Heart failure Other    Past Surgical History:  Procedure Laterality Date   ACHILLES TENDON LENGTHENING Right 02/01/2020   Procedure: ACHILLES TENDON LENGTHENING;  Surgeon: Erle Crocker, MD;  Location: [place];  Service: Orthopedics;  Laterality: Right;   ARTHRODESIS Right    BACK SURGERY     BLADDER SURGERY     BUNIONECTOMY     CERVICAL BIOPSY  W/  LOOP ELECTRODE EXCISION  1990's   in [place], [place] BIOPSY  04-05-05 and 02-12-06   04-05-05 benign but too rapid growth. RXd Provera and repeat Bx 72mo 02-12-06 Bx benign--Dr. SEdwinna Areola  FEMUR IM NAIL Left 08/27/2012   Procedure: INTRAMEDULLARY (IM) RETROGRADE FEMORAL NAILING;  Surgeon: CMcarthur Rossetti MD;  Location: [facility]  Service: Orthopedics;  Laterality: Left;   FOOT ARTHRODESIS Right 02/01/2020   Procedure: TRIPLE ARTHRODESIS,  TENDO ACHILLES LENGTHENING;  Surgeon: AErle Crocker MD;  Location: [facility]  Service: Orthopedics;  Laterality: Right;  LENGTH OF SURGERY: 4 HOURS   HAMMER TOE SURGERY Right    HEEL SPUR SURGERY     IM NAILING FEMORAL SHAFT FRACTURE Left 08/27/2012   Dr BNinfa Linden  KNEE SURGERY     multiple   OSTEOTOMY     SHOULDER SURGERY Left    SPINAL CORD STIMULATOR INSERTION N/A 02/26/2021   Procedure: THORACIC SPINAL CORD STIMULATOR PLACEMENT, PULSE GENERATOR;  Surgeon: CDeetta Perla MD;  Location: [facility];  Service: Neurosurgery;  Laterality: N/A;   TOTAL KNEE ARTHROPLASTY Bilateral  2009 and 2012   TUBAL LIGATION     VAGINAL HYSTERECTOMY  2018   Social History   Social History Narrative   Not on file   Immunization History  Administered Date(s) Administered   Moderna Sars-Covid-2 Vaccination 05/22/2019, 06/23/2019   Td 08/03/2013   Zoster Recombinat (Shingrix) 01/10/2018, 04/06/2018     Objective: Vital Signs: BP 132/80 (BP Location: Left Arm, Patient Position: Sitting, Cuff Size: Normal)   Pulse (!) 57   Resp 13   Ht 5' 8" (1.727 m)   Wt 257 lb 9.6 oz (116.8 kg)   LMP 03/19/2003   BMI 39.17 kg/m    Physical Exam Constitutional:      Appearance: She is obese.  Cardiovascular:     Rate and Rhythm: Normal rate and regular rhythm.  Pulmonary:     Effort: Pulmonary effort is normal.     Breath sounds: Normal breath sounds.  Musculoskeletal:     Right lower leg: No edema.     Left lower leg:  No edema.  Skin:    General: Skin is warm and dry.     Findings: No rash.  Psychiatric:        Mood and Affect: Mood normal.    Musculoskeletal Exam:  Shoulders full ROM, left worse than right pain with overhead abduction Elbows full ROM no tenderness or swelling Left wrist with significant ulnar subluxation and decreased ROM, tenderness to pressure possible swelling or chronic soft tissue thickening Extensive bony nodules of both hands including MCPs, PIPs, and DIPs no palpable synovitis Midline and paraspinal muscle tenderness to palpation in mid back and worse in low back and over sacrum Knees full ROM, well healed surgical scars  Investigation: No additional findings.  Imaging: No results found.  Recent Labs: Lab Results  Component Value Date   WBC 4.4 12/26/2021   HGB 12.4 12/26/2021   PLT 251 12/26/2021   NA 141 12/26/2021   K 4.5 12/26/2021   CL 105 12/26/2021   CO2 29 12/26/2021   GLUCOSE 86 12/26/2021   BUN 17 12/26/2021   CREATININE 0.70 12/26/2021   BILITOT 0.4 12/26/2021   ALKPHOS 123 (H) 09/13/2020   AST 23 12/26/2021   ALT 18 12/26/2021   PROT 6.7 12/26/2021   ALBUMIN 4.4 09/13/2020   CALCIUM 9.1 12/26/2021   GFRAA 115 08/30/2019   QFTBGOLDPLUS NEGATIVE 08/24/2021    Speciality Comments: No specialty comments available.  Procedures:  Medium Joint Inj: L intercarpal on 01/09/2022 4:10 PM Indications: pain Details: 27 G 1.5 in needle, dorsal approach Medications: 1 mL lidocaine 1 %; 40 mg triamcinolone acetonide 40 MG/ML Outcome: tolerated well, no immediate complications Procedure, treatment alternatives, risks and benefits explained, specific risks discussed. Consent was given by the patient. Immediately prior to procedure a time out was called to verify the correct patient, procedure, equipment, support staff and site/side marked as required. Patient was prepped and draped in the usual sterile fashion.     Allergies: Wellbutrin [bupropion] and  Lyrica [pregabalin]   Assessment / Plan:     Visit Diagnoses: Ankylosing spondylitis of multiple sites in spine ([place]) - Plan: predniSONE (STERAPRED UNI-PAK 21 TAB) 10 MG (21) TBPK tablet  Unfortunately not seeing a large benefit in symptoms since starting the rinvoq her reported intolerance is unusual with generalized feeling hot all of her or poorly but regardless we will plan to discontinue the medication at this time.  For ongoing symptoms will prescribe a steroid Dosepak.  We can follow-up again  fairly soon to discuss possible alternative treatment options.  High risk medication use  Stopping rinvoq for intolerance and lack of efficacy after 4 months is unlikely to see a great additional benefit.  However while still on this she had normal CBC and CMP panel checked from earlier this month which I reviewed.  Osteoarthritis, unspecified osteoarthritis type, unspecified site Chronic pain syndrome  Continue to follow-up with pain management for chronic pain of multiple sites which is mostly stable at this time.  Pain in left wrist - Plan: Medium Joint Inj: L intercarpal  Left wrist injection suspect there is local inflammation present at this time difficult to isolate exact carpal joint needle placement due to severe deformity and subluxation but procedure was tolerated fine.  Orders: Orders Placed This Encounter  Procedures   Medium Joint Inj: L intercarpal   Meds ordered this encounter  Medications   predniSONE (STERAPRED UNI-PAK 21 TAB) 10 MG (21) TBPK tablet    Sig: Take by mouth once daily 6, 5, 4, 3, 2, then 1 tablet daily    Dispense:  21 tablet    Refill:  0     Follow-Up Instructions: Return in about 6 weeks (around 02/20/2022) for AS stop rinvoq/wrist inj f/u 6wks.   Collier Salina, MD  Note - This record has been created using [employer].  Chart creation errors have been sought, but may not always  have been located. Such creation errors do not reflect on   the standard of medical care.

## 2022-01-09 ENCOUNTER — Ambulatory Visit: Payer: PPO | Attending: Internal Medicine | Admitting: Internal Medicine

## 2022-01-09 ENCOUNTER — Encounter: Payer: Self-pay | Admitting: Internal Medicine

## 2022-01-09 VITALS — BP 132/80 | HR 57 | Resp 13 | Ht 68.0 in | Wt 257.6 lb

## 2022-01-09 DIAGNOSIS — M1990 Unspecified osteoarthritis, unspecified site: Secondary | ICD-10-CM

## 2022-01-09 DIAGNOSIS — M450 Ankylosing spondylitis of multiple sites in spine: Secondary | ICD-10-CM

## 2022-01-09 DIAGNOSIS — G894 Chronic pain syndrome: Secondary | ICD-10-CM | POA: Diagnosis not present

## 2022-01-09 DIAGNOSIS — Z79899 Other long term (current) drug therapy: Secondary | ICD-10-CM | POA: Diagnosis not present

## 2022-01-09 DIAGNOSIS — M25532 Pain in left wrist: Secondary | ICD-10-CM | POA: Diagnosis not present

## 2022-01-09 MED ORDER — PREDNISONE 10 MG (21) PO TBPK: ORAL_TABLET | ORAL | 0 refills | Status: DC

## 2022-01-14 ENCOUNTER — Other Ambulatory Visit (HOSPITAL_COMMUNITY): Payer: Self-pay

## 2022-01-16 ENCOUNTER — Other Ambulatory Visit (HOSPITAL_COMMUNITY): Payer: Self-pay

## 2022-01-18 ENCOUNTER — Other Ambulatory Visit (HOSPITAL_COMMUNITY): Payer: PPO

## 2022-01-21 ENCOUNTER — Other Ambulatory Visit: Payer: Self-pay | Admitting: Family Medicine

## 2022-02-12 ENCOUNTER — Encounter: Payer: Self-pay | Admitting: Student in an Organized Health Care Education/Training Program

## 2022-02-12 ENCOUNTER — Ambulatory Visit
Payer: PPO | Attending: Student in an Organized Health Care Education/Training Program | Admitting: Student in an Organized Health Care Education/Training Program

## 2022-02-12 DIAGNOSIS — M48062 Spinal stenosis, lumbar region with neurogenic claudication: Secondary | ICD-10-CM

## 2022-02-12 DIAGNOSIS — M5416 Radiculopathy, lumbar region: Secondary | ICD-10-CM | POA: Diagnosis not present

## 2022-02-12 DIAGNOSIS — Z9889 Other specified postprocedural states: Secondary | ICD-10-CM

## 2022-02-12 DIAGNOSIS — G8929 Other chronic pain: Secondary | ICD-10-CM | POA: Diagnosis not present

## 2022-02-12 DIAGNOSIS — G894 Chronic pain syndrome: Secondary | ICD-10-CM

## 2022-02-12 DIAGNOSIS — M961 Postlaminectomy syndrome, not elsewhere classified: Secondary | ICD-10-CM | POA: Diagnosis not present

## 2022-02-12 DIAGNOSIS — Z79891 Long term (current) use of opiate analgesic: Secondary | ICD-10-CM | POA: Diagnosis not present

## 2022-02-12 MED ORDER — OXYCODONE-ACETAMINOPHEN 10-325 MG PO TABS: 1.0000 | ORAL_TABLET | Freq: Two times a day (BID) | ORAL | 0 refills | Status: DC | PRN

## 2022-02-12 NOTE — Progress Notes (Signed)
Patient: Lauren Greene  Service Category: E/M  Provider: Gillis Santa, MD  DOB: November 01, 1965  DOS: 02/12/2022  Location: Office  MRN: [id number]  Setting: Ambulatory outpatient  Referring Provider: Kathyrn Drown, MD  Type: Established Patient  Specialty: Interventional Pain Management  PCP: Kathyrn Drown, MD  Location: Remote location  Delivery: TeleHealth     Virtual Encounter - Pain Management PROVIDER NOTE: Information contained herein reflects review and annotations entered in association with encounter. Interpretation of such information and data should be left to medically-trained personnel. Information provided to patient can be located elsewhere in the medical record under "Patient Instructions". Document created using STT-dictation technology, any transcriptional errors that may result from process are unintentional.    Contact & Pharmacy Preferred: [phone] Home: [phone] (home) Mobile: [phone] (mobile) E-mail: [email or web address] .[place], [place] [address] Phone: [phone] Fax: [place]. [place] [address] Phone: [phone] Fax: [phone]   Pre-screening  Ms. Dobler offered "in-person" vs "virtual" encounter. She indicated preferring virtual for this encounter.   Reason COVID-19*  Social distancing based on CDC and AMA recommendations.   I contacted [name] on 02/12/2022 via telephone.      I clearly identified myself as Gillis Santa, MD. I verified that I was speaking with the correct person using two identifiers (Name: DEIRA SHIMER, and date of birth: 1965-03-30).  Consent I sought verbal advanced consent from [name] for virtual visit interactions. I informed Ms. Hubner of possible security and privacy concerns, risks, and limitations associated with providing "not-in-person" medical  evaluation and management services. I also informed Ms. Lauture of the availability of "in-person" appointments. Finally, I informed her that there would be a charge for the virtual visit and that she could be  personally, fully or partially, financially responsible for it. Ms. Rodenbeck expressed understanding and agreed to proceed.   Historic Elements   Ms. KATRINNA TRAVIESO is a 56 y.o. year old, female patient evaluated today after our last contact on 11/20/2021. Ms. Gage  has a past medical history of ADHD (attention deficit hyperactivity disorder), Ankylosing spondylitis ([place]), Arthritis, Back pain, Broken femur ([place]) (08/27/2012), Chronic pain, GERD (gastroesophageal reflux disease), Heart murmur, History of methicillin resistant staphylococcus aureus (MRSA) (2009), Hypertension, Migraine, Osteoarthritis, PVC (premature ventricular contraction), Raynaud disease, and Raynaud phenomenon (04/06/2018). She also  has a past surgical history that includes Total knee arthroplasty (Bilateral); Heel spur surgery; Back surgery; IM nailing femoral shaft fracture (Left, 08/27/2012); Femur IM nail (Left, 08/27/2012); Cervical biopsy w/ loop electrode excision (1990's); Endometrial biopsy (04-05-05 and 02-12-06); Tubal ligation; Shoulder surgery (Left); Vaginal hysterectomy (2018); Foot arthrodesis (Right, 02/01/2020); Achilles tendon lengthening (Right, 02/01/2020); Knee surgery; Bunionectomy; Spinal cord stimulator insertion (N/A, 02/26/2021); Hammer toe surgery (Right); Osteotomy; Bladder surgery; and Arthrodesis (Right). Ms. Branca has a current medication list which includes the following prescription(s): amlodipine, amphetamine-dextroamphetamine, amphetamine-dextroamphetamine, amphetamine-dextroamphetamine, diclofenac, duloxetine, lidocaine, lidocaine, methocarbamol, nystatin, omeprazole, [START ON 02/13/2022] oxycodone-acetaminophen, prednisone, rosuvastatin, silver sulfadiazine, trazodone, and rinvoq. She   reports that she quit smoking about 21 years ago. Her smoking use included cigarettes. She has a 20.00 pack-year smoking history. She has never used smokeless tobacco. She reports current alcohol use. She reports that she does not use drugs. Ms. Lees is allergic to wellbutrin [bupropion] and lyrica [pregabalin].  Estimated body mass index is 39.17 kg/m as calculated from the following:  Height as of 01/09/22: _0  (1.727 m).   Weight as of 01/09/22: 257 lb 9.6 oz (116.8 kg).  HPI  Today, she is being contacted for medication management.  +cough/fever, states that she is febrile to 101.  Did not want to expose any staff members to her illness.  She is due for a med refill tomorrow.  I will send in a prescription for 1 month at which point the patient will need to follow-up for a face-to-face visit for medication management.  Patient endorsed understanding.   Patient's pain is at baseline.  Patient continues multimodal pain regimen as prescribed.  States that it provides pain relief and improvement in functional status.   Pharmacotherapy Assessment   Opioid Analgesic:  Percocet 10-20 mg BID (MME 30-60)    Monitoring: [place] PMP: PDMP reviewed during this encounter.       Pharmacotherapy: No side-effects or adverse reactions reported. Compliance: No problems identified. Effectiveness: Clinically acceptable. Plan: Refer to "POC". UDS:  Summary  Date Value Ref Range Status  11/20/2021 Note  Final    Comment:    ==================================================================== [employer] 13 (MW) ==================================================================== Test                             Result       Flag       Units  Drug Present and Declared for Prescription Verification   Amphetamine                    >3300        EXPECTED   ng/mg creat    Amphetamine is available as a schedule II prescription drug.    Oxycodone                      >3300        EXPECTED   ng/mg  creat   Oxymorphone                    1890         EXPECTED   ng/mg creat   Noroxycodone                   >3300        EXPECTED   ng/mg creat   Noroxymorphone                 386          EXPECTED   ng/mg creat    Sources of oxycodone are scheduled prescription medications.    Oxymorphone, noroxycodone, and noroxymorphone are expected    metabolites of oxycodone. Oxymorphone is also available as a    scheduled prescription medication.  ==================================================================== Test                      Result    Flag   Units      Ref Range   Creatinine              303              mg/dL      >=20 ==================================================================== Declared Medications:  The flagging and interpretation on this report are based on the  following declared medications.  Unexpected results may arise from  inaccuracies in the declared medications.   **Note: The testing scope of this panel includes these medications:   Amphetamine (Adderall)  Oxycodone (Percocet)   **  Note: The testing scope of this panel does not include the  following reported medications:   Acetaminophen (Percocet)  Amlodipine (Norvasc)  Duloxetine (Cymbalta)  Lidocaine (Xylocaine)  Methocarbamol (Robaxin)  Nystatin (Mycostatin)  Omeprazole (Prilosec)  Rosuvastatin (Crestor)  Sulfadiazine (Silvadene)  Upadacitinib (Rinvoq) ==================================================================== For clinical consultation, please call [phone]. ====================================================================    No results found for: "CBDTHCR", "D8THCCBX", "D9THCCBX"   Laboratory Chemistry Profile   Renal Lab Results  Component Value Date   BUN 17 12/26/2021   CREATININE 0.70 12/26/2021   BCR SEE NOTE: 12/26/2021   GFRAA 115 08/30/2019   GFRNONAA >60 02/26/2021    Hepatic Lab Results  Component Value Date   AST 23 12/26/2021   ALT 18 12/26/2021    ALBUMIN 4.4 09/13/2020   ALKPHOS 123 (H) 09/13/2020   LIPASE 25 01/12/2016    Electrolytes Lab Results  Component Value Date   NA 141 12/26/2021   K 4.5 12/26/2021   CL 105 12/26/2021   CALCIUM 9.1 12/26/2021    Bone Lab Results  Component Value Date   VD25OH 31 01/07/2013    Inflammation (CRP: Acute Phase) (ESR: Chronic Phase) Lab Results  Component Value Date   CRP 2 09/13/2020   ESRSEDRATE 4 04/06/2018         Note: Above Lab results reviewed.  Imaging  XR Hand 2 View Left X-ray left hand 2 views  Extensive cystic and solid probable erosive changes in multiple carpal  joints with subluxation and invagination.  Moderately advanced first CMC  joint degenerative changes and MCP hyperextension.  Advanced third MCP  joint space narrowing with lateral osteophyte other MCPs appear normal.   PIP and DIP joint space with some asymmetric narrowing and lateral  osteophyte formation in second and third digits.  Impression  Chronic inflammatory appearing wrist joint changes with probable erosion  and severe subluxation, moderately advanced osteoarthritis changes in  thumb and second and third fingers distally XR Hand 2 View Right X-ray right hand 2 views  Radiocarpal joint space appears normal.  Mild degenerative changes base of  thumb.  Extensive joint space loss at second and third MCP joints with  subchondral cysts also articular surface probable erosion or partial joint  fusion.  Lateral osteophyte formation and asymmetric joint space narrowing  at PIP and DIPs.  Bone mineralization with possible generalized  osteopenia.  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 Diagnoses of S/P lumbar microdiscectomy (left L4/5 with micordissection), Spinal  stenosis, lumbar region, with neurogenic claudication, Failed back surgical syndrome, Encounter for long-term opiate analgesic use, Chronic radicular lumbar pain, and Lumbar radiculopathy were also pertinent to this visit.  Plan of Care    Ms. ANNSLEIGH DRAGOO has a current medication list which includes the following long-term medication(s): amlodipine, amphetamine-dextroamphetamine, amphetamine-dextroamphetamine, amphetamine-dextroamphetamine, duloxetine, omeprazole, rosuvastatin, trazodone, and rinvoq.  Pharmacotherapy (Medications Ordered): Meds ordered this encounter  Medications   oxyCODONE-acetaminophen (PERCOCET) 10-325 MG tablet    Sig: Take 1-2 tablets by mouth every 12 (twelve) hours as needed for pain. Must last 30 days.    Dispense:  120 tablet    Refill:  0    Chronic Pain: STOP Act (Not applicable) Fill 1 day early if closed on refill date. Avoid benzodiazepines within 8 hours of opioids   Continue Cymbalta, p.o. Voltaren, lidocaine ointment,  Robaxin as prescribed.  No refills needed.  Orders:  No orders of the defined types were placed in this encounter.  Follow-up plan:   Return in about 1 month (around 03/14/2022) for Medication Management, in person.    Recent Visits Date Type Provider Dept  11/20/21 Office Visit Gillis Santa, MD [facility]  Showing recent visits within past 90 days and meeting all other requirements Today's Visits Date Type Provider Dept  02/12/22 Office Visit Gillis Santa, MD [facility]  Showing today's visits and meeting all other requirements Future Appointments No visits were found meeting these conditions. Showing future appointments within next 90 days and meeting all other requirements  I discussed the assessment and treatment plan with the patient. The patient was provided an opportunity to ask questions and all were answered. The patient agreed with the plan and demonstrated an understanding of the  instructions.  Patient advised to call back or seek an in-person evaluation if the symptoms or condition worsens.  Duration of encounter: 28mnutes.  Note by: BGillis Santa MD Date: 02/12/2022; Time: 1:58 PM

## 2022-02-13 ENCOUNTER — Ambulatory Visit (INDEPENDENT_AMBULATORY_CARE_PROVIDER_SITE_OTHER): Payer: PPO | Admitting: Nurse Practitioner

## 2022-02-13 ENCOUNTER — Ambulatory Visit: Payer: PPO | Admitting: Family Medicine

## 2022-02-13 ENCOUNTER — Encounter: Payer: Self-pay | Admitting: Nurse Practitioner

## 2022-02-13 VITALS — BP 137/86 | HR 62 | Temp 97.8°F | Wt 253.6 lb

## 2022-02-13 DIAGNOSIS — J020 Streptococcal pharyngitis: Secondary | ICD-10-CM | POA: Diagnosis not present

## 2022-02-13 LAB — POCT RAPID STREP A (OFFICE): Rapid Strep A Screen: POSITIVE — AB

## 2022-02-13 MED ORDER — AMOXICILLIN 875 MG PO TABS: 875.0000 mg | ORAL_TABLET | Freq: Two times a day (BID) | ORAL | 0 refills | Status: AC

## 2022-02-13 NOTE — Progress Notes (Signed)
   Subjective:    Patient ID: Lauren Greene, female    DOB: 19-Aug-1965, 56 y.o.   MRN: [id number]  HPI Pt arrives with head congestion, fever of 100 (max temp 101.5), white spot in throat, cough, sore throat, sneezing x2 days. Pt was exposed to pneumonia; Covid test negative. Denies any SOB or difficulty breathing.   Review of Systems  Constitutional:  Positive for chills and fever.  HENT:  Positive for sneezing and sore throat.   Respiratory:  Positive for cough.        Objective:   Physical Exam Vitals reviewed.  Constitutional:      General: She is not in acute distress.    Appearance: Normal appearance. She is normal weight. She is not ill-appearing, toxic-appearing or diaphoretic.  HENT:     Head: Normocephalic and atraumatic.     Right Ear: Tympanic membrane, ear canal and external ear normal.     Left Ear: Tympanic membrane, ear canal and external ear normal.     Nose: Nose normal. No congestion or rhinorrhea.     Mouth/Throat:     Mouth: Mucous membranes are moist.     Pharynx: Oropharynx is clear. Posterior oropharyngeal erythema present. No oropharyngeal exudate.  Neck:     Vascular: No carotid bruit.  Cardiovascular:     Rate and Rhythm: Normal rate and regular rhythm.     Pulses: Normal pulses.     Heart sounds: Murmur heard.  Pulmonary:     Effort: Pulmonary effort is normal. No respiratory distress.     Breath sounds: Normal breath sounds. No wheezing.  Musculoskeletal:     Cervical back: Normal range of motion and neck supple. Tenderness present. No rigidity.     Comments: Grossly intact  Lymphadenopathy:     Cervical: No cervical adenopathy.  Skin:    General: Skin is warm.     Capillary Refill: Capillary refill takes less than 2 seconds.  Neurological:     Mental Status: She is alert.     Comments: Grossly intact  Psychiatric:        Mood and Affect: Mood normal.        Behavior: Behavior normal.           Assessment & Plan:   1. Strep  throat - POCT testing + for Strept - POCT rapid strep A - amoxicillin (AMOXIL) 875 MG tablet; Take 1 tablet (875 mg total) by mouth 2 (two) times daily for 10 days.  Dispense: 20 tablet; Refill: 0 - Should start to feel better in 1-2 days.  - RTC if symptoms worsen.

## 2022-02-20 ENCOUNTER — Ambulatory Visit: Payer: PPO | Admitting: Internal Medicine

## 2022-02-23 MED ORDER — TRIAMCINOLONE ACETONIDE 40 MG/ML IJ SUSP
40.0000 mg | INTRAMUSCULAR | Status: AC | PRN
  Administered 2022-01-09: 40 mg via INTRA_ARTICULAR

## 2022-02-23 MED ORDER — LIDOCAINE HCL 1 % IJ SOLN
1.0000 mL | INTRAMUSCULAR | Status: AC | PRN
  Administered 2022-01-09: 1 mL

## 2022-02-24 ENCOUNTER — Encounter: Payer: Self-pay | Admitting: Student in an Organized Health Care Education/Training Program

## 2022-02-25 DIAGNOSIS — M2011 Hallux valgus (acquired), right foot: Secondary | ICD-10-CM | POA: Diagnosis not present

## 2022-02-25 DIAGNOSIS — M79671 Pain in right foot: Secondary | ICD-10-CM | POA: Diagnosis not present

## 2022-02-25 DIAGNOSIS — M2141 Flat foot [pes planus] (acquired), right foot: Secondary | ICD-10-CM | POA: Diagnosis not present

## 2022-02-25 DIAGNOSIS — Q666 Other congenital valgus deformities of feet: Secondary | ICD-10-CM | POA: Diagnosis not present

## 2022-02-25 DIAGNOSIS — M25542 Pain in joints of left hand: Secondary | ICD-10-CM | POA: Diagnosis not present

## 2022-02-25 DIAGNOSIS — M19071 Primary osteoarthritis, right ankle and foot: Secondary | ICD-10-CM | POA: Diagnosis not present

## 2022-02-25 DIAGNOSIS — M2041 Other hammer toe(s) (acquired), right foot: Secondary | ICD-10-CM | POA: Diagnosis not present

## 2022-02-25 DIAGNOSIS — M7731 Calcaneal spur, right foot: Secondary | ICD-10-CM | POA: Diagnosis not present

## 2022-02-25 DIAGNOSIS — M25541 Pain in joints of right hand: Secondary | ICD-10-CM | POA: Diagnosis not present

## 2022-03-05 ENCOUNTER — Ambulatory Visit (INDEPENDENT_AMBULATORY_CARE_PROVIDER_SITE_OTHER): Payer: PPO | Admitting: Family Medicine

## 2022-03-05 VITALS — BP 136/84 | HR 78 | Temp 98.2°F | Ht 68.0 in | Wt 249.3 lb

## 2022-03-05 DIAGNOSIS — F419 Anxiety disorder, unspecified: Secondary | ICD-10-CM

## 2022-03-05 DIAGNOSIS — F988 Other specified behavioral and emotional disorders with onset usually occurring in childhood and adolescence: Secondary | ICD-10-CM | POA: Diagnosis not present

## 2022-03-05 DIAGNOSIS — E559 Vitamin D deficiency, unspecified: Secondary | ICD-10-CM | POA: Diagnosis not present

## 2022-03-05 DIAGNOSIS — F32A Depression, unspecified: Secondary | ICD-10-CM

## 2022-03-05 DIAGNOSIS — L570 Actinic keratosis: Secondary | ICD-10-CM

## 2022-03-05 MED ORDER — AMPHETAMINE-DEXTROAMPHET ER 30 MG PO CP24: ORAL_CAPSULE | ORAL | 0 refills | Status: DC

## 2022-03-05 MED ORDER — CLONAZEPAM 0.5 MG PO TABS: 0.5000 mg | ORAL_TABLET | Freq: Two times a day (BID) | ORAL | 0 refills | Status: AC | PRN

## 2022-03-05 MED ORDER — AMLODIPINE BESYLATE 2.5 MG PO TABS: 2.5000 mg | ORAL_TABLET | Freq: Every day | ORAL | 1 refills | Status: DC

## 2022-03-05 MED ORDER — VITAMIN D (ERGOCALCIFEROL) 1.25 MG (50000 UNIT) PO CAPS: 50000.0000 [IU] | ORAL_CAPSULE | ORAL | 2 refills | Status: DC

## 2022-03-05 NOTE — Patient Instructions (Signed)
So sorry your foot is giving you such troubles  We will send in refills on medicine plus also additional nerve medicine you can use on a as needed basis to try to help but not meant for long-term use-caution drowsiness-do not drive if feeling drowsy  We will see you back in 3 months sooner if any problems  We will help set you up with dermatology  TakeCare-Dr. Lorin Picket

## 2022-03-05 NOTE — Progress Notes (Signed)
   Subjective:    Patient ID: Lauren Greene, female    DOB: 1965/06/09, 56 y.o.   MRN: [id number]  HPI ADD follow for medication - refills Patient has adult ADD Been on medication for years Medicine does allow her to stay focused She tolerates the medicine well She needs refills  Face sore wants checked-she relates that there is a scaled area on her right cheek that is being given her some difficulties.  She is interested in getting this looked that were possibly removed been going on for several months  RLQ abd pain x 1 week-she describes a sharp pain on the right side of the abdomen worse with certain movements and rotations.  No nausea vomiting or diarrhea with this.  Anxiety and depression-she has been having a lot of anxiety and depression associated with her foot problem.  She is seeing a specialist at [facility] they are evaluating her for the possibility of surgery currently right now is possible they may not be able to do surgery she is having a lot of anxiety spells she is already on pain medicine and antidepressants she is requesting a short prescription of benzodiazepines to use to try to help with her symptoms when they become overwhelming to the level of a panic attack   Review of Systems     Objective:   Physical Exam General-in no acute distress Eyes-no discharge Lungs-respiratory rate normal, CTA CV-no murmurs,RRR Extremities skin warm dry no edema Neuro grossly normal Behavior normal, alert  Area on the right cheek could be actinic keratosis recommend referral to Dr. Charlton Haws      Assessment & Plan:  1. Attention deficit disorder, unspecified hyperactivity presence The patient was seen today as part of the visit regarding ADD.  Patient is stable on current regimen.  Appropriate prescriptions prescribed.  Medications were reviewed with the patient as well as compliance. Side effects were checked for. Discussion regarding effectiveness was held.  Prescriptions were electronically sent in.  Patient reminded to follow-up in approximately 3 months.   Plans to [facility] law with drug registry was checked and verified while present with the patient.  Drug registry checked 3 prescription sent in warning signs discussed.  Patient tolerates medicine well  2. Actinic keratoses Area on her face referral to Dr. Charlton Haws  3. Anxiety and depression Already on antidepressants.  We did discuss possibility of making changes in the medicine we will stick with the current dosing for now she is not suicidal.  She is hopeful that they will be able to help her with her foot which will in turn help her anxiety and depression  She does have some intermittent spells consistent with panic attacks it is reasonable to prescribe a low amount of Klonopin that she can use twice a day she was told never to use this near bedtime and that it can cause drowsiness not to drive with the medicine if it makes her exceptionally drowsy with the pain medicine to stop taking it.  Likelihood of overdosing with such a low dose is low for rare use.  4. Vitamin D deficiency Vitamin D 50,000 units once weekly for the next couple months then 2000 units oral daily check labs again in the spring  Follow-up in 3 months follow-up sooner if any problems or worsening depression anxiety

## 2022-03-06 NOTE — Progress Notes (Signed)
03/06/22-Referral placed in Epic

## 2022-03-06 NOTE — Addendum Note (Signed)
Addended by: Marlowe Shores on: 03/06/2022 09:24 AM   Modules accepted: Orders

## 2022-03-14 ENCOUNTER — Encounter: Payer: Self-pay | Admitting: Student in an Organized Health Care Education/Training Program

## 2022-03-14 ENCOUNTER — Ambulatory Visit
Payer: PPO | Attending: Student in an Organized Health Care Education/Training Program | Admitting: Student in an Organized Health Care Education/Training Program

## 2022-03-14 VITALS — BP 144/89 | HR 65 | Temp 98.3°F | Resp 16 | Ht 68.0 in | Wt 249.0 lb

## 2022-03-14 DIAGNOSIS — Z79891 Long term (current) use of opiate analgesic: Secondary | ICD-10-CM | POA: Insufficient documentation

## 2022-03-14 DIAGNOSIS — G8929 Other chronic pain: Secondary | ICD-10-CM | POA: Insufficient documentation

## 2022-03-14 DIAGNOSIS — Z9889 Other specified postprocedural states: Secondary | ICD-10-CM | POA: Diagnosis not present

## 2022-03-14 DIAGNOSIS — G894 Chronic pain syndrome: Secondary | ICD-10-CM | POA: Diagnosis not present

## 2022-03-14 DIAGNOSIS — M961 Postlaminectomy syndrome, not elsewhere classified: Secondary | ICD-10-CM | POA: Diagnosis not present

## 2022-03-14 DIAGNOSIS — M48062 Spinal stenosis, lumbar region with neurogenic claudication: Secondary | ICD-10-CM | POA: Insufficient documentation

## 2022-03-14 DIAGNOSIS — M5416 Radiculopathy, lumbar region: Secondary | ICD-10-CM | POA: Diagnosis not present

## 2022-03-14 MED ORDER — OXYCODONE-ACETAMINOPHEN 10-325 MG PO TABS: 1.0000 | ORAL_TABLET | Freq: Two times a day (BID) | ORAL | 0 refills | Status: AC | PRN

## 2022-03-14 MED ORDER — METHOCARBAMOL 750 MG PO TABS: 750.0000 mg | ORAL_TABLET | Freq: Three times a day (TID) | ORAL | 5 refills | Status: DC | PRN

## 2022-03-14 NOTE — Progress Notes (Signed)
Nursing Pain Medication Assessment:  Safety precautions to be maintained throughout the outpatient stay will include: orient to surroundings, keep bed in low position, maintain call bell within reach at all times, provide assistance with transfer out of bed and ambulation.  Medication Inspection Compliance: Pill count conducted under aseptic conditions, in front of the patient. Neither the pills nor the bottle was removed from the patient's sight at any time. Once count was completed pills were immediately returned to the patient in their original bottle.  Medication: Oxycodone/APAP Pill/Patch Count:  30 of 120 pills remain Pill/Patch Appearance: Markings consistent with prescribed medication Bottle Appearance: Standard pharmacy container. Clearly labeled. Filled Date: 31 / 04 / 2023 Last Medication intake:  Today

## 2022-03-14 NOTE — Progress Notes (Signed)
PROVIDER NOTE: Information contained herein reflects review and annotations entered in association with encounter. Interpretation of such information and data should be left to medically-trained personnel. Information provided to patient can be located elsewhere in the medical record under "Patient Instructions". Document created using STT-dictation technology, any transcriptional errors that may result from process are unintentional.    Patient: Lauren Greene  Service Category: E/M  Provider: Gillis Santa, MD  DOB: 1965/05/20  DOS: 03/14/2022  Specialty: Interventional Pain Management  MRN: [id number]  Setting: Ambulatory outpatient  PCP: Kathyrn Drown, MD  Type: Established Patient    Referring Provider: Kathyrn Drown, MD  Location: Office  Delivery: Face-to-face     HPI  [name], a 56 y.o. year old female, is here today because of her Chronic pain syndrome [G89.4]. [name] primary complain today is Back Pain (Lumbar bilateral/ midline) and Foot Pain (Right s/p bunion surgey ) Last encounter: My last encounter with her was on 08/27/2021  Pertinent problems: [name] has Periprosthetic fracture around internal prosthetic left knee joint; Chronic back pain; Morbid obesity (Hand); Lumbar degenerative disc disease; Chronic, continuous use of opioids; Fibromyalgia; Lumbar spondylosis; Chronic pain syndrome; and Encounter for long-term opiate analgesic use on their pertinent problem list. Pain Assessment: Severity of Chronic pain is reported as a 7 /10. Location: Back Lower, Left, Right/denies. Onset: More than a month ago. Quality: Aching, Constant, Discomfort. Timing: Constant. Modifying factor(s): rest and medications. Vitals:  height is _0  (1.727 m) and weight is 249 lb (112.9 kg). Her temporal temperature is 98.3 F (36.8 C). Her blood pressure is 144/89 (abnormal) and her pulse is 65. Her respiration is 16 and oxygen saturation is 97%.   Reason for encounter:  medication management.    Patient presents today for medication management.  She is requesting the addition of a long-acting opioid analgesic which I recommended against.  I only recommend single agent opioid therapy. She indicated that she may seek a referral to another Pain MD that does long acting opioid analgesics. She has established with a provider at the [facility].  Pharmacotherapy Assessment  Analgesic:  Percocet 20 mg BID (MME 60)    Monitoring: [place] PMP: PDMP reviewed during this encounter.       Pharmacotherapy: No side-effects or adverse reactions reported. Compliance: No problems identified. Effectiveness: Clinically acceptable.  Janett Billow, RN  03/14/2022  3:20 PM  Sign when Signing Visit Nursing Pain Medication Assessment:  Safety precautions to be maintained throughout the outpatient stay will include: orient to surroundings, keep bed in low position, maintain call bell within reach at all times, provide assistance with transfer out of bed and ambulation.  Medication Inspection Compliance: Pill count conducted under aseptic conditions, in front of the patient. Neither the pills nor the bottle was removed from the patient's sight at any time. Once count was completed pills were immediately returned to the patient in their original bottle.  Medication: Oxycodone/APAP Pill/Patch Count:  30 of 120 pills remain Pill/Patch Appearance: Markings consistent with prescribed medication Bottle Appearance: Standard pharmacy container. Clearly labeled. Filled Date: 61 / 04 / 2023 Last Medication intake:  Today     UDS:  Summary  Date Value Ref Range Status  11/20/2021 Note  Final    Comment:    ==================================================================== [employer] Select 13 (MW) ==================================================================== Test  Result       Flag       Units  Drug Present and Declared  for Prescription Verification   Amphetamine                    >3300        EXPECTED   ng/mg creat    Amphetamine is available as a schedule II prescription drug.    Oxycodone                      >3300        EXPECTED   ng/mg creat   Oxymorphone                    1890         EXPECTED   ng/mg creat   Noroxycodone                   >3300        EXPECTED   ng/mg creat   Noroxymorphone                 386          EXPECTED   ng/mg creat    Sources of oxycodone are scheduled prescription medications.    Oxymorphone, noroxycodone, and noroxymorphone are expected    metabolites of oxycodone. Oxymorphone is also available as a    scheduled prescription medication.  ==================================================================== Test                      Result    Flag   Units      Ref Range   Creatinine              303              mg/dL      >=20 ==================================================================== Declared Medications:  The flagging and interpretation on this report are based on the  following declared medications.  Unexpected results may arise from  inaccuracies in the declared medications.   **Note: The testing scope of this panel includes these medications:   Amphetamine (Adderall)  Oxycodone (Percocet)   **Note: The testing scope of this panel does not include the  following reported medications:   Acetaminophen (Percocet)  Amlodipine (Norvasc)  Duloxetine (Cymbalta)  Lidocaine (Xylocaine)  Methocarbamol (Robaxin)  Nystatin (Mycostatin)  Omeprazole (Prilosec)  Rosuvastatin (Crestor)  Sulfadiazine (Silvadene)  Upadacitinib (Rinvoq) ==================================================================== For clinical consultation, please call [phone]. ====================================================================      ROS  Constitutional: Denies any fever or chills Gastrointestinal: No reported hemesis, hematochezia, vomiting, or acute GI  distress Musculoskeletal:  Low back pain, right foot pain, right ankle pain, bilateral shoulder pain Neurological: No reported episodes of acute onset apraxia, aphasia, dysarthria, agnosia, amnesia, paralysis, loss of coordination, or loss of consciousness  Medication Review  DULoxetine, Vitamin D (Ergocalciferol), amLODipine, amphetamine-dextroamphetamine, clonazePAM, diclofenac, lidocaine, methocarbamol, nystatin, omeprazole, oxyCODONE-acetaminophen, rosuvastatin, silver sulfADIAZINE, and traZODone  History Review  Allergy: Ms. Skluzacek is allergic to wellbutrin [bupropion] and lyrica [pregabalin]. Drug: Ms. Starkman  reports no history of drug use. Alcohol:  reports current alcohol use. Tobacco:  reports that she quit smoking about 21 years ago. Her smoking use included cigarettes. She has a 20.00 pack-year smoking history. She has never used smokeless tobacco. Social: Ms. Kearse  reports that she quit smoking about 21 years ago. Her smoking use included cigarettes. She has a 20.00 pack-year smoking history. She has  never used smokeless tobacco. She reports current alcohol use. She reports that she does not use drugs. Medical:  has a past medical history of ADHD (attention deficit hyperactivity disorder), Ankylosing spondylitis ([place]), Arthritis, Back pain, Broken femur ([place]) (08/27/2012), Chronic pain, GERD (gastroesophageal reflux disease), Heart murmur, History of methicillin resistant staphylococcus aureus (MRSA) (2009), Hypertension, Migraine, Osteoarthritis, PVC (premature ventricular contraction), Raynaud disease, and Raynaud phenomenon (04/06/2018). Surgical: Ms. Lemaire  has a past surgical history that includes Total knee arthroplasty (Bilateral); Heel spur surgery; Back surgery; IM nailing femoral shaft fracture (Left, 08/27/2012); Femur IM nail (Left, 08/27/2012); Cervical biopsy w/ loop electrode excision (1990's); Endometrial biopsy (04-05-05 and 02-12-06); Tubal ligation; Shoulder  surgery (Left); Vaginal hysterectomy (2018); Foot arthrodesis (Right, 02/01/2020); Achilles tendon lengthening (Right, 02/01/2020); Knee surgery; Bunionectomy; Spinal cord stimulator insertion (N/A, 02/26/2021); Hammer toe surgery (Right); Osteotomy; Bladder surgery; and Arthrodesis (Right). Family: family history includes Bipolar disorder in her mother and sister; Breast cancer in her mother; Cancer in her father, mother, and another family member; Heart disease in her father; Heart failure in her father and another family member.  Laboratory Chemistry Profile   Renal Lab Results  Component Value Date   BUN 17 12/26/2021   CREATININE 0.70 12/26/2021   BCR SEE NOTE: 12/26/2021   GFRAA 115 08/30/2019   GFRNONAA >60 02/26/2021    Hepatic Lab Results  Component Value Date   AST 23 12/26/2021   ALT 18 12/26/2021   ALBUMIN 4.4 09/13/2020   ALKPHOS 123 (H) 09/13/2020   LIPASE 25 01/12/2016    Electrolytes Lab Results  Component Value Date   NA 141 12/26/2021   K 4.5 12/26/2021   CL 105 12/26/2021   CALCIUM 9.1 12/26/2021    Bone Lab Results  Component Value Date   VD25OH 31 01/07/2013    Inflammation (CRP: Acute Phase) (ESR: Chronic Phase) Lab Results  Component Value Date   CRP 2 09/13/2020   ESRSEDRATE 4 04/06/2018         Note: Above Lab results reviewed.  Recent Imaging Review  XR Hand 2 View Left X-ray left hand 2 views  Extensive cystic and solid probable erosive changes in multiple carpal  joints with subluxation and invagination.  Moderately advanced first CMC  joint degenerative changes and MCP hyperextension.  Advanced third MCP  joint space narrowing with lateral osteophyte other MCPs appear normal.   PIP and DIP joint space with some asymmetric narrowing and lateral  osteophyte formation in second and third digits.  Impression  Chronic inflammatory appearing wrist joint changes with probable erosion  and severe subluxation, moderately advanced  osteoarthritis changes in  thumb and second and third fingers distally XR Hand 2 View Right X-ray right hand 2 views  Radiocarpal joint space appears normal.  Mild degenerative changes base of  thumb.  Extensive joint space loss at second and third MCP joints with  subchondral cysts also articular surface probable erosion or partial joint  fusion.  Lateral osteophyte formation and asymmetric joint space narrowing  at PIP and DIPs.  Bone mineralization with possible generalized  osteopenia.  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 In no apparent acute distress Mental status: Alert, oriented x 3 (person, place, & time)  Respiratory: No evidence of acute respiratory distress Eyes: PERLA Vitals: BP (!) 144/89 (BP Location: Right Arm, Patient Position: Sitting, Cuff Size: Large) Comment (Cuff Size): forearm  Pulse 65   Temp 98.3 F (36.8 C) (Temporal)   Resp 16   Ht _0  (1.727 m)   Wt 249 lb (112.9 kg)   LMP 03/19/2003   SpO2 97%   BMI 37.86 kg/m  BMI: Estimated body mass index is 37.86 kg/m as calculated from the following:   Height as of this encounter: _1  (1.727 m).   Weight as of this encounter: 249 lb (112.9 kg). Ideal: Ideal body weight: 63.9 kg (140 lb 14 oz) Adjusted ideal body weight: 83.5 kg (184 lb 2 oz)   Thoracic Spine Area Exam  Skin & Axial Inspection: Well healed scar from previous spine surgery detected Alignment: Symmetrical Functional ROM: Unrestricted ROM Stability: No instability detected Muscle Tone/Strength: Functionally intact. No obvious neuro-muscular anomalies detected. Sensory (Neurological): Unimpaired Muscle strength & Tone: No palpable anomalies  Lumbar Spine Area Exam  Skin &  Axial Inspection: Well healed scar from previous spine surgery detected Alignment: Symmetrical Functional ROM: Unrestricted ROM       Stability: No instability detected Muscle Tone/Strength: Functionally intact. No obvious neuro-muscular anomalies detected. Sensory (Neurological): Unimpaired  Gait & Posture Assessment  Ambulation: Unassisted Gait: Relatively normal for age and body habitus Posture: WNL  Lower Extremity Exam    Side: Right lower extremity  Side: Left lower extremity  Stability: No instability observed          Stability: No instability observed          Skin & Extremity Inspection: Skin color, temperature, and hair growth are WNL. No peripheral edema or cyanosis. No masses, redness, swelling, asymmetry, or associated skin lesions. No contractures.  Skin & Extremity Inspection: Skin color, temperature, and hair growth are WNL. No peripheral edema or cyanosis. No masses, redness, swelling, asymmetry, or associated skin lesions. No contractures.  Functional ROM: Pain restricted ROM  right ankle and foot          Functional ROM: Unrestricted ROM                  Muscle Tone/Strength: Functionally intact. No obvious neuro-muscular anomalies detected.  Muscle Tone/Strength: Functionally intact. No obvious neuro-muscular anomalies detected.  Sensory (Neurological): Neurogenic pain pattern        Sensory (Neurological): Unimpaired        DTR: Patellar: deferred today Achilles: deferred today Plantar: deferred today  DTR: Patellar: deferred today Achilles: deferred today Plantar: deferred today  Palpation: No palpable anomalies  Palpation: No palpable anomalies    Assessment   Status Diagnosis  Persistent Persistent Persistent 1. Chronic pain syndrome   2. S/P lumbar microdiscectomy (left L4/5 with micordissection)   3. Spinal stenosis, lumbar region, with neurogenic claudication   4. Failed back surgical syndrome   5. Encounter for long-term opiate analgesic use   6.  Lumbar radiculopathy   7. Chronic radicular lumbar pain          Plan of Care    Ms. AZARA GEMME has a current medication list which includes the following long-term medication(s): amlodipine, amphetamine-dextroamphetamine, amphetamine-dextroamphetamine, amphetamine-dextroamphetamine, clonazepam, duloxetine, omeprazole, rosuvastatin, and trazodone.  Pharmacotherapy (Medications Ordered): Meds ordered this encounter  Medications   oxyCODONE-acetaminophen (PERCOCET) 10-325 MG tablet    Sig: Take 1-2 tablets by mouth every 12 (twelve) hours as needed for pain. Must last 30 days.    Dispense:  120 tablet    Refill:  0    Chronic Pain: STOP Act (Not applicable) Fill 1 day early if closed on refill date. Avoid benzodiazepines within 8 hours of opioids   oxyCODONE-acetaminophen (PERCOCET) 10-325 MG tablet    Sig: Take 1-2 tablets by mouth every 12 (twelve) hours as needed for pain. Must last 30 days.    Dispense:  120 tablet    Refill:  0    Chronic Pain: STOP Act (Not applicable) Fill 1 day early if closed on refill date. Avoid benzodiazepines within 8 hours of opioids   oxyCODONE-acetaminophen (PERCOCET) 10-325 MG tablet    Sig: Take 1-2 tablets by mouth every 12 (twelve) hours as needed for pain. Must last 30 days.    Dispense:  120 tablet    Refill:  0    Chronic Pain: STOP Act (Not applicable) Fill 1 day early if closed on refill date. Avoid benzodiazepines within 8 hours of opioids   methocarbamol (ROBAXIN) 750 MG tablet    Sig: Take 1 tablet (750 mg total) by mouth every 8 (eight) hours as needed for muscle spasms.    Dispense:  90 tablet    Refill:  5   Orders:  No orders of the defined types were placed in this encounter.  Follow-up plan:   Return in about 3 months (around 06/13/2022) for Medication Management, in person.    Recent Visits Date Type Provider Dept  02/12/22 Office Visit Gillis Santa, MD [facility]  Showing recent visits within past 90  days and meeting all other requirements Today's Visits Date Type Provider Dept  03/14/22 Office Visit Gillis Santa, MD [facility]  Showing today's visits and meeting all other requirements Future Appointments No visits were found meeting these conditions. Showing future appointments within next 90 days and meeting all other requirements  I discussed the assessment and treatment plan with the patient. The patient was provided an opportunity to ask questions and all were answered. The patient agreed with the plan and demonstrated an understanding of the instructions.  Patient advised to call back or seek an in-person evaluation if the symptoms or condition worsens.  Duration of encounter: 30mnutes.  Note by: BGillis Santa MD Date: 03/14/2022; Time: 3:58 PM

## 2022-03-15 DIAGNOSIS — M2141 Flat foot [pes planus] (acquired), right foot: Secondary | ICD-10-CM | POA: Diagnosis not present

## 2022-03-15 DIAGNOSIS — M19071 Primary osteoarthritis, right ankle and foot: Secondary | ICD-10-CM | POA: Diagnosis not present

## 2022-03-15 DIAGNOSIS — M19072 Primary osteoarthritis, left ankle and foot: Secondary | ICD-10-CM | POA: Diagnosis not present

## 2022-03-15 DIAGNOSIS — T84213A Breakdown (mechanical) of internal fixation device of bones of foot and toes, initial encounter: Secondary | ICD-10-CM | POA: Diagnosis not present

## 2022-03-18 NOTE — Progress Notes (Deleted)
Office Visit Note  Patient: Lauren Greene             Date of Birth: 03/27/1965           MRN: [id number]             PCP: Kathyrn Drown, MD Referring: Kathyrn Drown, MD Visit Date: 03/19/2022   Subjective:  No chief complaint on file.   History of Present Illness: Lauren Greene is a 57 y.o. female here for follow up for ankylosing spondylitis after stopping rinvoq last visit due to little symptom improvement and left wrist injection.***   Previous HPI 01/09/22 Lauren Greene is a 57 y.o. female here for follow up with history of ankylosing spondylitis and chronic pain of multiple areas both axial disease and peripheral joint pains. She started rinvoq for treatment now on this almost 4 months and so far does not notice a large difference. She also reports feeling hot all over some of the time with this medicine. Pain is very bad and interferes with activity and sleep. She has right foot pain plans to follow up with [facility] about surgery options for this. Pain all over most of the time, left wrist remains very achy even while in bed at night and sometimes wakes her.   Previous HPI 09/05/2021 Lauren Greene is a 57 y.o. female here for follow up with history of ankylosing spondylitis and chronic pain of multiple areas both axial disease and peripheral joint pains. Xrays at initial visit showing changes of OA also concerning for MCP joint erosion. Baseline labs for considering trial of rinvoq were normal. She continues with similar symptoms of joint pains also prominent stiffness symptoms.    Previous HPI 08/24/21 Lauren Greene is a 57 y.o. female here for osteoarthritis with chronic pain of multiple areas and history of ankylosing spondylitis. She was previously seen at [facility] most recently 01/2021 tried Humira last year. Some active peripheral arthritis was reported at those encounters, in addition to chronic back pain and some generalized pain. She has had  multiple orthopedic surgery and seeing neurosurgery for spinal stimulator placement   DMARD Hx Humira 01/2021   No Rheumatology ROS completed.   PMFS History:  Patient Active Problem List   Diagnosis Date Noted   PAD (peripheral artery disease) ([place]) 12/04/2021   High risk medication use 08/24/2021   Spinal stenosis, lumbar region, with neurogenic claudication 07/17/2021   Localized osteoarthritis of shoulder regions, bilateral 04/24/2021   Chronic pain of both shoulders 04/24/2021   Failed back surgical syndrome 04/24/2021   S/P lumbar microdiscectomy (left L4/5 with micordissection) 04/24/2021   Ankylosing spondylitis ([place]) 01/08/2021   HLA B27 positive 01/08/2021   Primary osteoarthritis of right ankle 11/23/2019   Essential hypertension 08/30/2019   Chronic radicular lumbar pain 08/23/2019   Other hyperlipidemia 11/16/2018   Foot pain, bilateral 06/04/2018   Raynaud phenomenon 04/06/2018   Overactive bladder 04/06/2018   Cellulitis of toe, right 04/01/2018   Chilblains 04/01/2018   Encounter for long-term opiate analgesic use 03/30/2018   Marijuana use 01/29/2018   Lumbar spondylosis 10/22/2017   Lumbar facet arthropathy 10/22/2017   Chronic pain syndrome 10/22/2017   Lumbar degenerative disc disease 01/06/2017   GERD (gastroesophageal reflux disease) 05/27/2016   Chronic, continuous use of opioids 05/27/2016   Fibromyalgia 05/27/2016   Moderate episode of recurrent major depressive disorder ([place]) 02/01/2016   Hyperhydrosis disorder 10/24/2015   Osteoarthritis 08/16/2015   Morbid obesity ([place]) 08/07/2015  Elevated blood pressure 01/31/2014   Arthritis 06/24/2013   Myalgia and myositis 06/24/2013   Chronic back pain 02/16/2013   Depression, major, in remission ([place]) 01/05/2013   Chronic arthralgias of knees and hips 01/05/2013   Periprosthetic fracture around internal prosthetic left knee joint 08/27/2012   Attention deficit disorder 07/08/2012    Past Medical  History:  Diagnosis Date   ADHD (attention deficit hyperactivity disorder)    Ankylosing spondylitis ([place])    Arthritis    Back pain    Broken femur ([place]) 08/27/2012   Chronic pain    chronic opioid use   GERD (gastroesophageal reflux disease)    Heart murmur    asymptomatic   History of methicillin resistant staphylococcus aureus (MRSA) 2009   Hypertension    Migraine    Osteoarthritis    PVC (premature ventricular contraction)    Raynaud disease    Raynaud phenomenon 04/06/2018   [facility] diagnosed 2020    Family History  Problem Relation Age of Onset   Cancer Mother    Breast cancer Mother    Bipolar disorder Mother    Heart failure Father    Cancer Father    Heart disease Father    Bipolar disorder Sister    Cancer Other    Heart failure Other    Past Surgical History:  Procedure Laterality Date   ACHILLES TENDON LENGTHENING Right 02/01/2020   Procedure: ACHILLES TENDON LENGTHENING;  Surgeon: Erle Crocker, MD;  Location: [facility];  Service: Orthopedics;  Laterality: Right;   ARTHRODESIS Right    BACK SURGERY     BLADDER SURGERY     BUNIONECTOMY     CERVICAL BIOPSY  W/ LOOP ELECTRODE EXCISION  1990's   in [place], [place] BIOPSY  04-05-05 and 02-12-06   04-05-05 benign but too rapid growth. RXd Provera and repeat Bx 69mo 02-12-06 Bx benign--Dr. SEdwinna Areola  FEMUR IM NAIL Left 08/27/2012   Procedure: INTRAMEDULLARY (IM) RETROGRADE FEMORAL NAILING;  Surgeon: CMcarthur Rossetti MD;  [facility]  Service: Orthopedics;  Laterality: Left;   FOOT ARTHRODESIS Right 02/01/2020   Procedure: TRIPLE ARTHRODESIS,  TENDO ACHILLES LENGTHENING;  Surgeon: AErle Crocker MD;  [facility]  Service: Orthopedics;  Laterality: Right;  LENGTH OF SURGERY: 4 HOURS   HAMMER TOE SURGERY Right    HEEL SPUR SURGERY     IM NAILING FEMORAL SHAFT FRACTURE Left 08/27/2012   Dr BNinfa Linden  KNEE SURGERY      multiple   OSTEOTOMY     SHOULDER SURGERY Left    SPINAL CORD STIMULATOR INSERTION N/A 02/26/2021   Procedure: THORACIC SPINAL CORD STIMULATOR PLACEMENT, PULSE GENERATOR;  Surgeon: CDeetta Perla MD;  Location: [facility];  Service: Neurosurgery;  Laterality: N/A;   TOTAL KNEE ARTHROPLASTY Bilateral    2009 and 2012   TUBAL LIGATION     VAGINAL HYSTERECTOMY  2018   Social History   Social History Narrative   Not on file   Immunization History  Administered Date(s) Administered   Moderna Sars-Covid-2 Vaccination 05/22/2019, 06/23/2019   Td 08/03/2013   Zoster Recombinat (Shingrix) 01/10/2018, 04/06/2018     Objective: Vital Signs: LMP 03/19/2003    Physical Exam   Musculoskeletal Exam: ***  CDAI Exam: CDAI Score: -- Patient Global: --; Provider Global: -- Swollen: --; Tender: -- Joint Exam 03/19/2022   No joint exam has been documented for this visit   There is currently no  information documented on the homunculus. Go to the Rheumatology activity and complete the homunculus joint exam.  Investigation: No additional findings.  Imaging: No results found.  Recent Labs: Lab Results  Component Value Date   WBC 4.4 12/26/2021   HGB 12.4 12/26/2021   PLT 251 12/26/2021   NA 141 12/26/2021   K 4.5 12/26/2021   CL 105 12/26/2021   CO2 29 12/26/2021   GLUCOSE 86 12/26/2021   BUN 17 12/26/2021   CREATININE 0.70 12/26/2021   BILITOT 0.4 12/26/2021   ALKPHOS 123 (H) 09/13/2020   AST 23 12/26/2021   ALT 18 12/26/2021   PROT 6.7 12/26/2021   ALBUMIN 4.4 09/13/2020   CALCIUM 9.1 12/26/2021   GFRAA 115 08/30/2019   QFTBGOLDPLUS NEGATIVE 08/24/2021    Speciality Comments: No specialty comments available.  Procedures:  No procedures performed Allergies: Wellbutrin [bupropion] and Lyrica [pregabalin]   Assessment / Plan:     Visit Diagnoses: No diagnosis found.  ***  Orders: No orders of the defined types were placed in this encounter.  No orders of the  defined types were placed in this encounter.    Follow-Up Instructions: No follow-ups on file.   Collier Salina, MD  Note - This record has been created using [employer].  Chart creation errors have been sought, but may not always  have been located. Such creation errors do not reflect on  the standard of medical care.

## 2022-03-19 ENCOUNTER — Ambulatory Visit: Payer: PPO | Attending: Internal Medicine | Admitting: Internal Medicine

## 2022-04-18 ENCOUNTER — Ambulatory Visit (INDEPENDENT_AMBULATORY_CARE_PROVIDER_SITE_OTHER): Payer: PPO | Admitting: Family Medicine

## 2022-04-18 VITALS — BP 128/76 | Wt 251.0 lb

## 2022-04-18 DIAGNOSIS — W540XXA Bitten by dog, initial encounter: Secondary | ICD-10-CM

## 2022-04-18 DIAGNOSIS — L03113 Cellulitis of right upper limb: Secondary | ICD-10-CM

## 2022-04-18 DIAGNOSIS — L989 Disorder of the skin and subcutaneous tissue, unspecified: Secondary | ICD-10-CM | POA: Diagnosis not present

## 2022-04-18 DIAGNOSIS — Z23 Encounter for immunization: Secondary | ICD-10-CM

## 2022-04-18 DIAGNOSIS — S61431A Puncture wound without foreign body of right hand, initial encounter: Secondary | ICD-10-CM

## 2022-04-18 MED ORDER — AMOXICILLIN-POT CLAVULANATE 875-125 MG PO TABS: 1.0000 | ORAL_TABLET | Freq: Two times a day (BID) | ORAL | 0 refills | Status: DC

## 2022-04-18 NOTE — Progress Notes (Signed)
   Subjective:    Patient ID: Lauren Greene, female    DOB: [birth year], 57 y.o.   MRN: [id number]  HPI Patient arrives today with dog bite on right hand on 1/30. Patient has been taking doxycycline.  Her own dog bit her.  Her dog is up-to-date on shots.  She is now having some redness and tenderness around there.  Is taking doxycycline.  We discussed this will need to be changed.  Review of Systems     Objective:   Physical Exam Cellulitis noted of the hand with 2 spots of the dog bite.  Wrist and appears intact fingers intact  She also has a skin lesion on her face needs referral to dermatology she has a specific dermatologist she would like to see     Assessment & Plan:  Cellulitis Dog bite Antibiotics prescribed Warning signs regarding ER/hospitalization discussed Tetanus shot today Patient dealing with a lot of depression symptoms related to her weight but she is trying to work her way through Referral to dermatology regarding the facial lesion

## 2022-06-08 IMAGING — US US CAROTID DUPLEX BILAT
1 series · 13 of 24 positions shown · non-contrast
Comparison: None Available.

CLINICAL DATA: Carotid atherosclerosis by CT

EXAM:
BILATERAL CAROTID DUPLEX ULTRASOUND
TECHNIQUE: Gray scale imaging, color Doppler and duplex ultrasound were
performed of bilateral carotid and vertebral arteries in the neck.

[Series 1: us carotid bilateral · 13 of 80 slices shown]
[im 1/80]
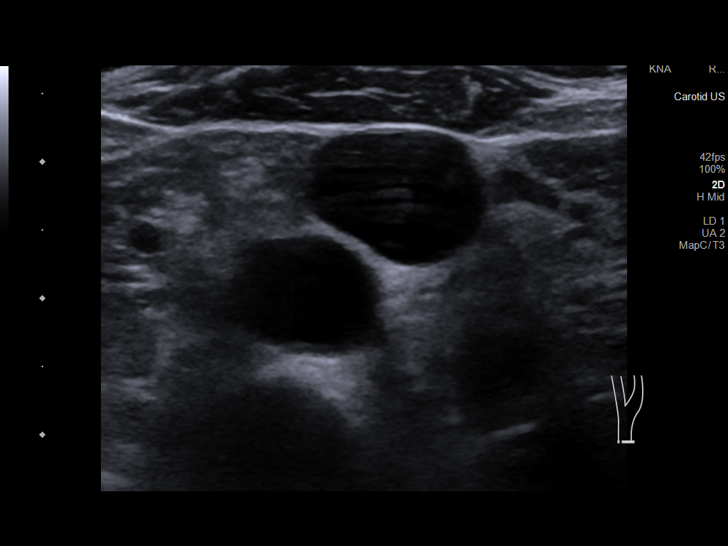
[im 7/80]
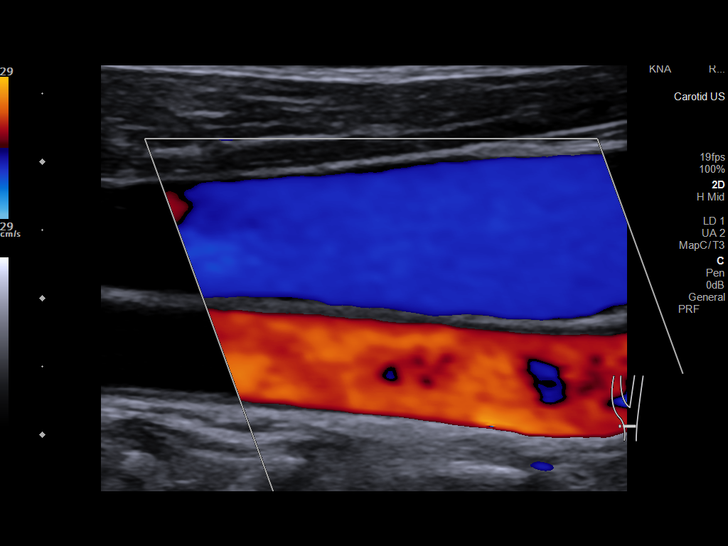
[im 14/80]
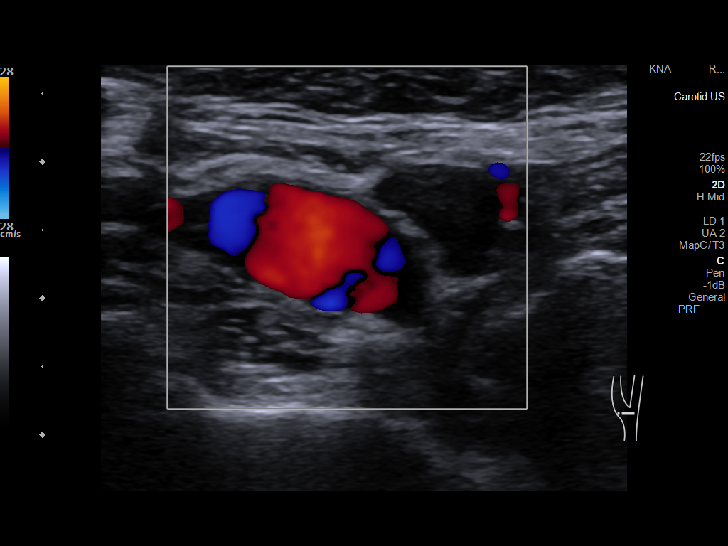
[im 21/80]
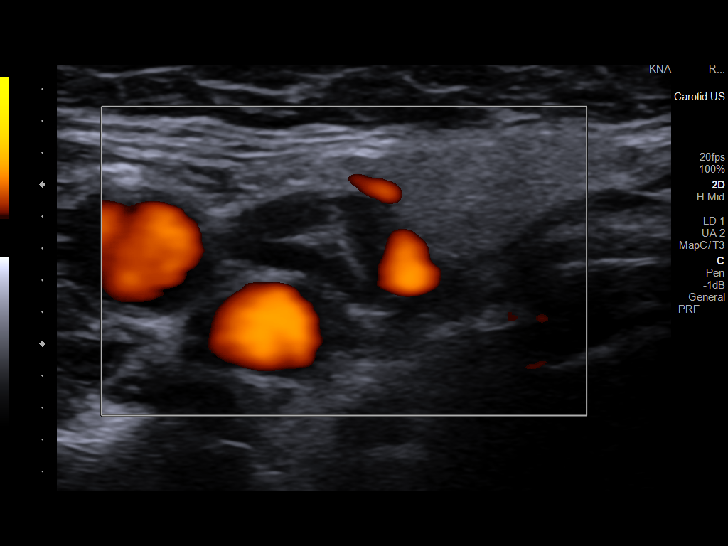
[im 28/80]
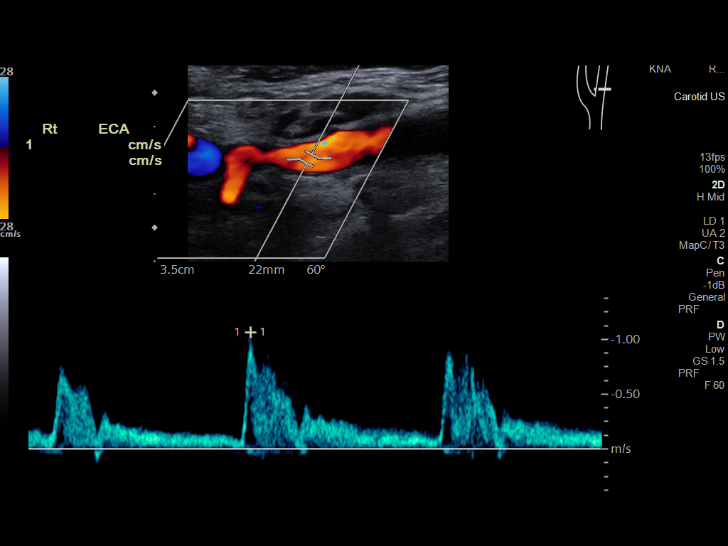
[im 35/80]
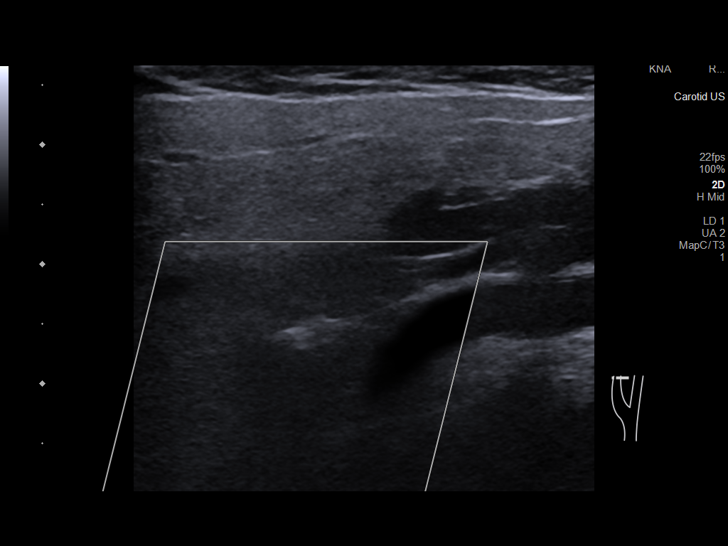
[im 42/80]
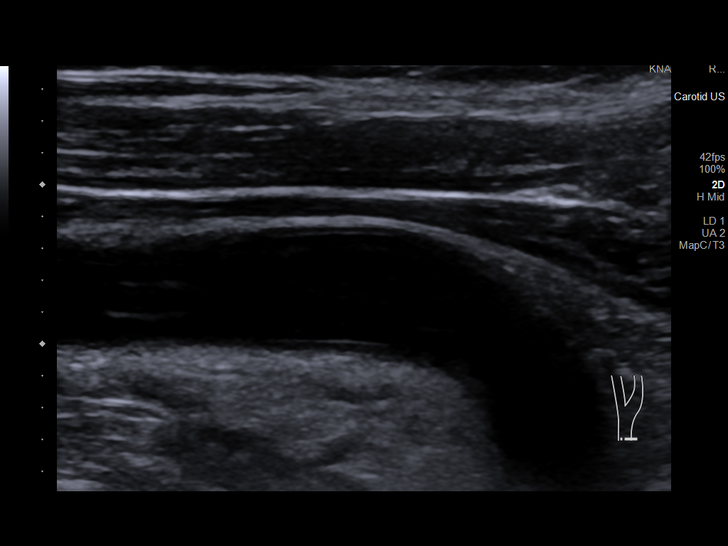
[im 45/80]
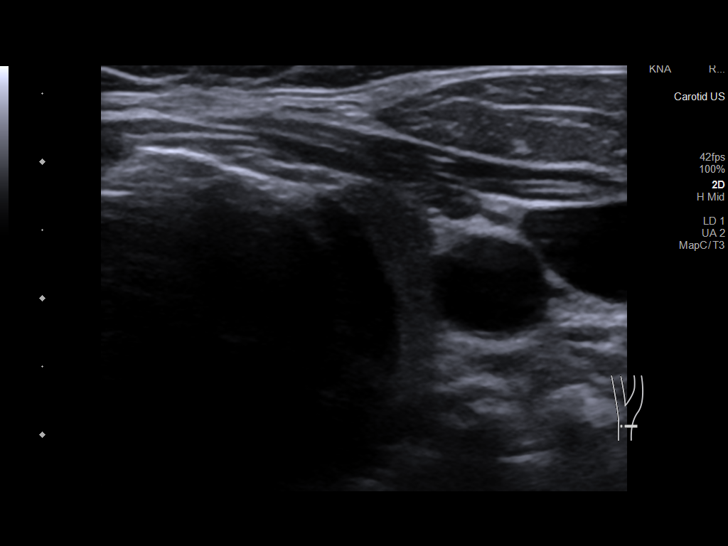
[im 52/80]
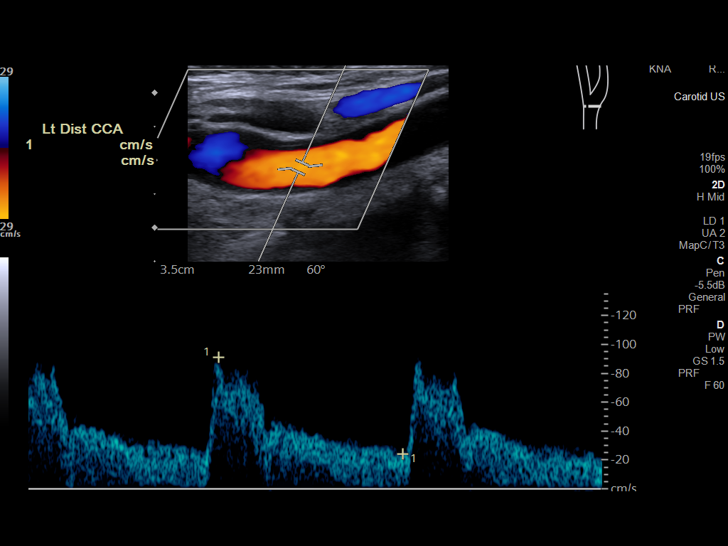
[im 59/80]
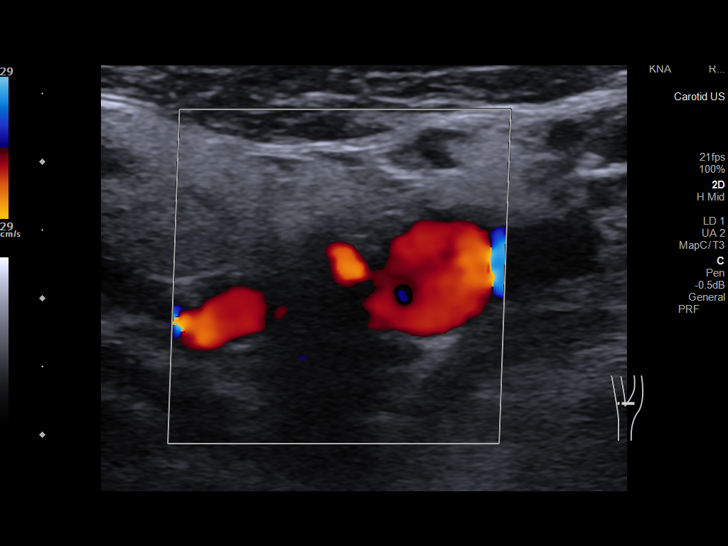
[im 66/80]
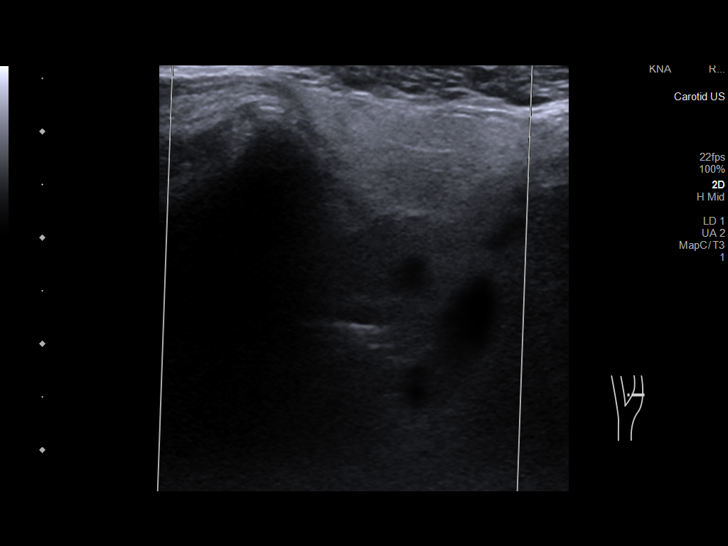
[im 73/80]
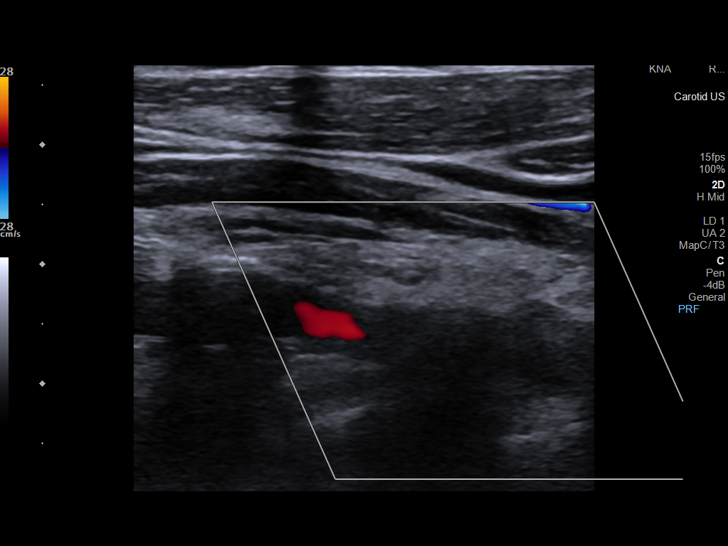
[im 80/80]
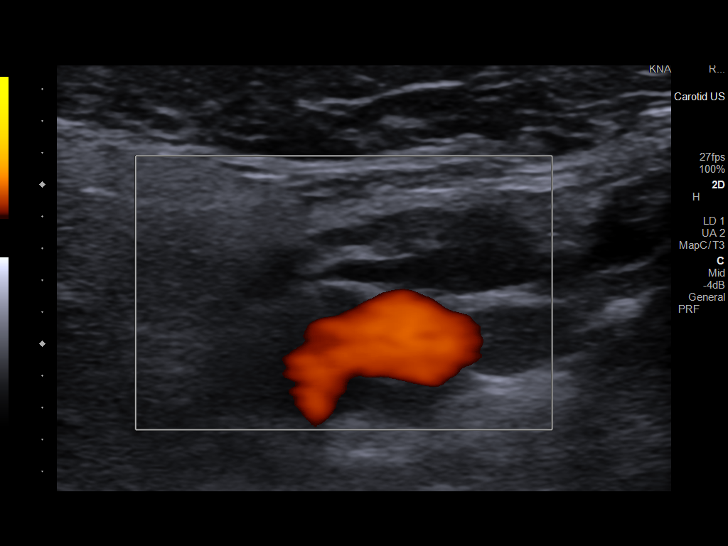

[13 of 24 positions shown; findings below may reference images not displayed]

FINDINGS: Criteria: Quantification of carotid stenosis is based on velocity
parameters that correlate the residual internal carotid diameter
with NASCET-based stenosis levels, using the diameter of the distal
internal carotid lumen as the denominator for stenosis measurement.

The following velocity measurements were obtained:

RIGHT

ICA: 82/27 cm/sec

CCA: 98/18 cm/sec

SYSTOLIC ICA/CCA RATIO:

ECA: 106 cm/sec

LEFT

ICA: 104/30 cm/sec

CCA: 92/21 cm/sec

SYSTOLIC ICA/CCA RATIO:

ECA: 80 cm/sec

RIGHT CAROTID ARTERY: Intimal thickening and trace atherosclerotic
changes. Negative for stenosis, velocity elevation, or turbulent
flow. No waveform abnormality. Degree of narrowing less than 50% by
ultrasound criteria.

RIGHT VERTEBRAL ARTERY:  Normal antegrade flow

LEFT CAROTID ARTERY: Intimal thickening and similar trace thin
atherosclerotic changes. Negative for stenosis, velocity elevation,
or turbulent flow. Degree of narrowing less than 50% by ultrasound
criteria.

LEFT VERTEBRAL ARTERY:  Normal antegrade flow
IMPRESSION: Minor carotid atherosclerosis. Negative for stenosis. Degree of
narrowing less than 50% bilaterally by ultrasound criteria.

Patent antegrade vertebral flow bilaterally

## 2022-06-12 ENCOUNTER — Ambulatory Visit (INDEPENDENT_AMBULATORY_CARE_PROVIDER_SITE_OTHER): Payer: PPO | Admitting: Family Medicine

## 2022-06-12 ENCOUNTER — Encounter: Payer: Self-pay | Admitting: Family Medicine

## 2022-06-12 DIAGNOSIS — I10 Essential (primary) hypertension: Secondary | ICD-10-CM | POA: Diagnosis not present

## 2022-06-12 DIAGNOSIS — E785 Hyperlipidemia, unspecified: Secondary | ICD-10-CM

## 2022-06-12 MED ORDER — AMLODIPINE BESYLATE 2.5 MG PO TABS: 2.5000 mg | ORAL_TABLET | Freq: Every day | ORAL | 1 refills | Status: DC

## 2022-06-12 MED ORDER — OMEPRAZOLE 40 MG PO CPDR: 40.0000 mg | DELAYED_RELEASE_CAPSULE | Freq: Every day | ORAL | 1 refills | Status: DC

## 2022-06-12 MED ORDER — AMPHETAMINE-DEXTROAMPHET ER 30 MG PO CP24: ORAL_CAPSULE | ORAL | 0 refills | Status: DC

## 2022-06-12 MED ORDER — ROSUVASTATIN CALCIUM 10 MG PO TABS: 10.0000 mg | ORAL_TABLET | Freq: Every day | ORAL | 1 refills | Status: DC

## 2022-06-12 MED ORDER — TRAZODONE HCL 100 MG PO TABS: ORAL_TABLET | ORAL | 2 refills | Status: DC

## 2022-06-12 MED ORDER — DICLOFENAC SODIUM 75 MG PO TBEC: 75.0000 mg | DELAYED_RELEASE_TABLET | Freq: Two times a day (BID) | ORAL | 5 refills | Status: DC

## 2022-06-12 NOTE — Progress Notes (Signed)
   Subjective:    Patient ID: Lauren Greene, female    DOB: 09/12/1965, 57 y.o.   MRN: [id number]  HPI  This patient has adult ADD. Takes medication responsibly. Medication does help the patient focus in be more functional. Patient relates that they are or not abusing the medication or misusing the medication. The patient understands that if they're having any negative side effects such as elevated high blood pressure severe headaches they would need stop the medication follow-up immediately. They also understand that the prescriptions are to last for 3 months then the patient will need to follow-up before having further prescriptions.  Patient compliance 2 capsules QD  Does medication help patient function /attention better  somewhat  Side effects  none   Review of Systems     Objective:   Physical Exam General-in no acute distress Eyes-no discharge Lungs-respiratory rate normal, CTA CV-no murmurs,RRR Extremities skin warm dry no edema Neuro grossly normal Behavior normal, alert        Assessment & Plan:   1. Essential hypertension Blood pressure decent control continue current measures Hyperlipidemia check lipid profile on next visit continue medication Stratify heart risk by initiating CT cardiac calcium score patient is interested for [facility] for morbid obesity she relates that he has indications now with Medicare for individuals who have underlying cardiovascular disease currently right now she would not qualify based on her diagnosis but we will do the coronary calcium test she might possibly qualify - rosuvastatin (CRESTOR) 10 MG tablet; Take 1 tablet (10 mg total) by mouth daily.  Dispense: 90 tablet; Refill: 1 - CT CARDIAC SCORING (SELF PAY ONLY)  2. Hyperlipidemia, unspecified hyperlipidemia type Lipid profile healthy diet continue current measures - rosuvastatin (CRESTOR) 10 MG tablet; Take 1 tablet (10 mg total) by mouth daily.  Dispense: 90 tablet; Refill: 1 -  CT CARDIAC SCORING (SELF PAY ONLY)  Adult ADD patient does state helps some typically ADD medicines only help 40% for adults Drug registry checked Scripts given electronically  Follow-up 3 months Follow-up if ongoing troubles

## 2022-06-13 ENCOUNTER — Encounter: Payer: PPO | Admitting: Student in an Organized Health Care Education/Training Program

## 2022-06-14 ENCOUNTER — Ambulatory Visit: Payer: PPO | Admitting: Family Medicine

## 2022-06-25 ENCOUNTER — Ambulatory Visit
Payer: PPO | Attending: Student in an Organized Health Care Education/Training Program | Admitting: Student in an Organized Health Care Education/Training Program

## 2022-06-25 ENCOUNTER — Encounter: Payer: Self-pay | Admitting: Student in an Organized Health Care Education/Training Program

## 2022-06-25 VITALS — BP 157/78 | HR 65 | Temp 97.9°F | Resp 16 | Ht 68.0 in | Wt 251.0 lb

## 2022-06-25 DIAGNOSIS — M48062 Spinal stenosis, lumbar region with neurogenic claudication: Secondary | ICD-10-CM | POA: Insufficient documentation

## 2022-06-25 DIAGNOSIS — Z79891 Long term (current) use of opiate analgesic: Secondary | ICD-10-CM | POA: Insufficient documentation

## 2022-06-25 DIAGNOSIS — G894 Chronic pain syndrome: Secondary | ICD-10-CM | POA: Diagnosis not present

## 2022-06-25 DIAGNOSIS — M5416 Radiculopathy, lumbar region: Secondary | ICD-10-CM

## 2022-06-25 DIAGNOSIS — Z9889 Other specified postprocedural states: Secondary | ICD-10-CM | POA: Insufficient documentation

## 2022-06-25 DIAGNOSIS — M961 Postlaminectomy syndrome, not elsewhere classified: Secondary | ICD-10-CM

## 2022-06-25 DIAGNOSIS — G8929 Other chronic pain: Secondary | ICD-10-CM | POA: Diagnosis not present

## 2022-06-25 MED ORDER — GABAPENTIN 300 MG PO CAPS: 300.0000 mg | ORAL_CAPSULE | Freq: Three times a day (TID) | ORAL | 2 refills | Status: DC

## 2022-06-25 MED ORDER — OXYCODONE-ACETAMINOPHEN 10-325 MG PO TABS: 1.0000 | ORAL_TABLET | Freq: Two times a day (BID) | ORAL | 0 refills | Status: AC | PRN

## 2022-06-25 MED ORDER — OXYCODONE-ACETAMINOPHEN 10-325 MG PO TABS: 1.0000 | ORAL_TABLET | Freq: Two times a day (BID) | ORAL | 0 refills | Status: DC | PRN

## 2022-06-25 NOTE — Progress Notes (Signed)
Nursing Pain Medication Assessment:  Safety precautions to be maintained throughout the outpatient stay will include: orient to surroundings, keep bed in low position, maintain call bell within reach at all times, provide assistance with transfer out of bed and ambulation.  Medication Inspection Compliance: Pill count conducted under aseptic conditions, in front of the patient. Neither the pills nor the bottle was removed from the patient's sight at any time. Once count was completed pills were immediately returned to the patient in their original bottle.  Medication: Oxycodone/APAP Pill/Patch Count:  30 of 120 pills remain Pill/Patch Appearance: Markings consistent with prescribed medication Bottle Appearance: Standard pharmacy container. Clearly labeled. Filled Date: 03 / 23 / 2024 Last Medication intake:  Today

## 2022-06-25 NOTE — Progress Notes (Signed)
PROVIDER NOTE: Information contained herein reflects review and annotations entered in association with encounter. Interpretation of such information and data should be left to medically-trained personnel. Information provided to patient can be located elsewhere in the medical record under "Patient Instructions". Document created using STT-dictation technology, any transcriptional errors that may result from process are unintentional.    Patient: Lauren Greene  Service Category: E/M  Provider: Edward Jolly, MD  DOB: 18-Jul-1965  DOS: 06/25/2022  Specialty: Interventional Pain Management  MRN: [id number]  Setting: Ambulatory outpatient  PCP: Babs Sciara, MD  Type: Established Patient    Referring Provider: Babs Sciara, MD  Location: Office  Delivery: Face-to-face     HPI  Ms. SIERRIA ONOFREY, a 57 y.o. year old female, is here today because of her Chronic pain syndrome [G89.4]. Ms. Hammitt primary complain today is Back Pain (Lumbar midline to right ) and Foot Pain (Right s/p surgery ) Last encounter: My last encounter with her was on 03/14/22  Pertinent problems: Ms. Wesselmann has Periprosthetic fracture around internal prosthetic left knee joint; Chronic back pain; Morbid obesity; Lumbar degenerative disc disease; Chronic, continuous use of opioids; Fibromyalgia; Lumbar spondylosis; Chronic pain syndrome; and Encounter for long-term opiate analgesic use on their pertinent problem list. Pain Assessment: Severity of Chronic pain is reported as a 7 /10. Location: Back Lower, Right/denies. Onset: More than a month ago. Quality: Discomfort, Constant, Sharp, Burning. Timing: Constant. Modifying factor(s): nothing is helping currently.  medications are not helping very much. Vitals:  height is 5\' 8"  (1.727 m) and weight is 251 lb (113.9 kg). Her temporal temperature is 97.9 F (36.6 C). Her blood pressure is 157/78 (abnormal) and her pulse is 65. Her respiration is 16 and oxygen saturation is 95%.    Reason for encounter: medication management.    No change in medical history since last visit.  Patient's pain is at baseline.  Patient continues multimodal pain regimen as prescribed.  States that it provides pain relief and improvement in functional status.   Pharmacotherapy Assessment  Analgesic:  Percocet 20 mg BID (MME 60)    Monitoring: [place] PMP: PDMP reviewed during this encounter.       Pharmacotherapy: No side-effects or adverse reactions reported. Compliance: No problems identified. Effectiveness: Clinically acceptable.  Vernie Ammons, RN  06/25/2022  1:55 PM  Sign when Signing Visit Nursing Pain Medication Assessment:  Safety precautions to be maintained throughout the outpatient stay will include: orient to surroundings, keep bed in low position, maintain call bell within reach at all times, provide assistance with transfer out of bed and ambulation.  Medication Inspection Compliance: Pill count conducted under aseptic conditions, in front of the patient. Neither the pills nor the bottle was removed from the patient's sight at any time. Once count was completed pills were immediately returned to the patient in their original bottle.  Medication: Oxycodone/APAP Pill/Patch Count:  30 of 120 pills remain Pill/Patch Appearance: Markings consistent with prescribed medication Bottle Appearance: Standard pharmacy container. Clearly labeled. Filled Date: 03 / 23 / 2024 Last Medication intake:  Today     UDS:  Summary  Date Value Ref Range Status  11/20/2021 Note  Final    Comment:    ==================================================================== [employer] [facility] (MW) ==================================================================== Test                             Result       Flag  Units  Drug Present and Declared for Prescription Verification   Amphetamine                    >3300        EXPECTED   ng/mg creat    Amphetamine is available as a  schedule II prescription drug.    Oxycodone                      >3300        EXPECTED   ng/mg creat   Oxymorphone                    1890         EXPECTED   ng/mg creat   Noroxycodone                   >3300        EXPECTED   ng/mg creat   Noroxymorphone                 386          EXPECTED   ng/mg creat    Sources of oxycodone are scheduled prescription medications.    Oxymorphone, noroxycodone, and noroxymorphone are expected    metabolites of oxycodone. Oxymorphone is also available as a    scheduled prescription medication.  ==================================================================== Test                      Result    Flag   Units      Ref Range   Creatinine              303              mg/dL      >=95 ==================================================================== Declared Medications:  The flagging and interpretation on this report are based on the  following declared medications.  Unexpected results may arise from  inaccuracies in the declared medications.   **Note: The testing scope of this panel includes these medications:   Amphetamine (Adderall)  Oxycodone (Percocet)   **Note: The testing scope of this panel does not include the  following reported medications:   Acetaminophen (Percocet)  Amlodipine (Norvasc)  Duloxetine (Cymbalta)  Lidocaine (Xylocaine)  Methocarbamol (Robaxin)  Nystatin (Mycostatin)  Omeprazole (Prilosec)  Rosuvastatin (Crestor)  Sulfadiazine (Silvadene)  Upadacitinib (Rinvoq) ==================================================================== For clinical consultation, please call [phone]. ====================================================================      ROS  Constitutional: Denies any fever or chills Gastrointestinal: No reported hemesis, hematochezia, vomiting, or acute GI distress Musculoskeletal:  Low back pain, right foot pain, right ankle pain, bilateral shoulder pain Neurological: No reported  episodes of acute onset apraxia, aphasia, dysarthria, agnosia, amnesia, paralysis, loss of coordination, or loss of consciousness  Medication Review  DULoxetine, amLODipine, amphetamine-dextroamphetamine, clonazePAM, diclofenac, gabapentin, lidocaine, methocarbamol, nystatin, omeprazole, oxyCODONE-acetaminophen, rosuvastatin, silver sulfADIAZINE, and traZODone  History Review  Allergy: Ms. Orchard is allergic to wellbutrin [bupropion] and lyrica [pregabalin]. Drug: Ms. Lohr  reports no history of drug use. Alcohol:  reports current alcohol use. Tobacco:  reports that she quit smoking about 21 years ago. Her smoking use included cigarettes. She has a 20.00 pack-year smoking history. She has never used smokeless tobacco. Social: Ms. Moga  reports that she quit smoking about 21 years ago. Her smoking use included cigarettes. She has a 20.00 pack-year smoking history. She has never used smokeless tobacco. She reports current alcohol use. She reports that she does not use  drugs. Medical:  has a past medical history of ADHD (attention deficit hyperactivity disorder), Ankylosing spondylitis, Arthritis, Back pain, Broken femur (08/27/2012), Chronic pain, GERD (gastroesophageal reflux disease), Heart murmur, History of methicillin resistant staphylococcus aureus (MRSA) (2009), Hypertension, Migraine, Osteoarthritis, PVC (premature ventricular contraction), Raynaud disease, and Raynaud phenomenon (04/06/2018). Surgical: Ms. Molnar  has a past surgical history that includes Total knee arthroplasty (Bilateral); Heel spur surgery; Back surgery; IM nailing femoral shaft fracture (Left, 08/27/2012); Femur IM nail (Left, 08/27/2012); Cervical biopsy w/ loop electrode excision (1990's); Endometrial biopsy (04-05-05 and 02-12-06); Tubal ligation; Shoulder surgery (Left); Vaginal hysterectomy (2018); Foot arthrodesis (Right, 02/01/2020); Achilles tendon lengthening (Right, 02/01/2020); Knee surgery; Bunionectomy;  Spinal cord stimulator insertion (N/A, 02/26/2021); Hammer toe surgery (Right); Osteotomy; Bladder surgery; and Arthrodesis (Right). Family: family history includes Bipolar disorder in her mother and sister; Breast cancer in her mother; Cancer in her father, mother, and another family member; Heart disease in her father; Heart failure in her father and another family member.  Laboratory Chemistry Profile   Renal Lab Results  Component Value Date   BUN 17 12/26/2021   CREATININE 0.70 12/26/2021   BCR SEE NOTE: 12/26/2021   GFRAA 115 08/30/2019   GFRNONAA >60 02/26/2021    Hepatic Lab Results  Component Value Date   AST 23 12/26/2021   ALT 18 12/26/2021   ALBUMIN 4.4 09/13/2020   ALKPHOS 123 (H) 09/13/2020   LIPASE 25 01/12/2016    Electrolytes Lab Results  Component Value Date   NA 141 12/26/2021   K 4.5 12/26/2021   CL 105 12/26/2021   CALCIUM 9.1 12/26/2021    Bone Lab Results  Component Value Date   VD25OH 31 01/07/2013    Inflammation (CRP: Acute Phase) (ESR: Chronic Phase) Lab Results  Component Value Date   CRP 2 09/13/2020   ESRSEDRATE 4 04/06/2018         Note: Above Lab results reviewed.  Recent Imaging Review  XR Hand 2 View Left X-ray left hand 2 views  Extensive cystic and solid probable erosive changes in multiple carpal  joints with subluxation and invagination.  Moderately advanced first CMC  joint degenerative changes and MCP hyperextension.  Advanced third MCP  joint space narrowing with lateral osteophyte other MCPs appear normal.   PIP and DIP joint space with some asymmetric narrowing and lateral  osteophyte formation in second and third digits.  Impression  Chronic inflammatory appearing wrist joint changes with probable erosion  and severe subluxation, moderately advanced osteoarthritis changes in  thumb and second and third fingers distally XR Hand 2 View Right X-ray right hand 2 views  Radiocarpal joint space appears normal.  Mild  degenerative changes base of  thumb.  Extensive joint space loss at second and third MCP joints with  subchondral cysts also articular surface probable erosion or partial joint  fusion.  Lateral osteophyte formation and asymmetric joint space narrowing  at PIP and DIPs.  Bone mineralization with possible generalized  osteopenia.  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 In no apparent acute distress Mental status: Alert, oriented x 3 (person, place, & time)       Respiratory: No evidence of acute respiratory distress Eyes: PERLA Vitals: BP (!) 157/78 (BP Location: Right Arm, Patient  Position: Sitting, Cuff Size: Normal) Comment (Cuff Size): forearm  Pulse 65   Temp 97.9 F (36.6 C) (Temporal)   Resp 16   Ht 5\' 8"  (1.727 m)   Wt 251 lb (113.9 kg)   LMP 03/19/2003   SpO2 95%   BMI 38.16 kg/m  BMI: Estimated body mass index is 38.16 kg/m as calculated from the following:   Height as of this encounter: 5\' 8"  (1.727 m).   Weight as of this encounter: 251 lb (113.9 kg). Ideal: Ideal body weight: 63.9 kg (140 lb 14 oz) Adjusted ideal body weight: 83.9 kg (184 lb 14.8 oz)   Thoracic Spine Area Exam  Skin & Axial Inspection: Well healed scar from previous spine surgery detected Alignment: Symmetrical Functional ROM: Unrestricted ROM Stability: No instability detected Muscle Tone/Strength: Functionally intact. No obvious neuro-muscular anomalies detected. Sensory (Neurological): Unimpaired Muscle strength & Tone: No palpable anomalies  Lumbar Spine Area Exam  Skin & Axial Inspection: Well healed scar from previous spine surgery detected Alignment: Symmetrical Functional ROM: Unrestricted ROM       Stability: No instability  detected Muscle Tone/Strength: Functionally intact. No obvious neuro-muscular anomalies detected. Sensory (Neurological): Unimpaired  Gait & Posture Assessment  Ambulation: Unassisted Gait: Relatively normal for age and body habitus Posture: WNL  Lower Extremity Exam    Side: Right lower extremity  Side: Left lower extremity  Stability: No instability observed          Stability: No instability observed          Skin & Extremity Inspection: Skin color, temperature, and hair growth are WNL. No peripheral edema or cyanosis. No masses, redness, swelling, asymmetry, or associated skin lesions. No contractures.  Skin & Extremity Inspection: Skin color, temperature, and hair growth are WNL. No peripheral edema or cyanosis. No masses, redness, swelling, asymmetry, or associated skin lesions. No contractures.  Functional ROM: Pain restricted ROM  right ankle and foot          Functional ROM: Unrestricted ROM                  Muscle Tone/Strength: Functionally intact. No obvious neuro-muscular anomalies detected.  Muscle Tone/Strength: Functionally intact. No obvious neuro-muscular anomalies detected.  Sensory (Neurological): Neurogenic pain pattern        Sensory (Neurological): Unimpaired        DTR: Patellar: deferred today Achilles: deferred today Plantar: deferred today  DTR: Patellar: deferred today Achilles: deferred today Plantar: deferred today  Palpation: No palpable anomalies  Palpation: No palpable anomalies    Assessment   Status Diagnosis  Persistent Persistent Persistent 1. Chronic pain syndrome   2. S/P lumbar microdiscectomy (left L4/5 with micordissection)   3. Spinal stenosis, lumbar region, with neurogenic claudication   4. Failed back surgical syndrome   5. Encounter for long-term opiate analgesic use   6. Lumbar radiculopathy   7. Chronic radicular lumbar pain          Plan of Care    Ms. Lauren Hakelaine G Sanzone has a current medication list which includes the  following long-term medication(s): amlodipine, amphetamine-dextroamphetamine, amphetamine-dextroamphetamine, amphetamine-dextroamphetamine, clonazepam, duloxetine, gabapentin, omeprazole, rosuvastatin, and trazodone.  Pharmacotherapy (Medications Ordered): Meds ordered this encounter  Medications   oxyCODONE-acetaminophen (PERCOCET) 10-325 MG tablet    Sig: Take 1-2 tablets by mouth 2 (two) times daily as needed for pain (moderate pain).    Dispense:  120 tablet    Refill:  0   oxyCODONE-acetaminophen (PERCOCET) 10-325 MG tablet    Sig:  Take 1-2 tablets by mouth 2 (two) times daily as needed for pain (moderate pain).    Dispense:  120 tablet    Refill:  0   oxyCODONE-acetaminophen (PERCOCET) 10-325 MG tablet    Sig: Take 1-2 tablets by mouth 2 (two) times daily as needed for pain (moderate pain).    Dispense:  120 tablet    Refill:  0   gabapentin (NEURONTIN) 300 MG capsule    Sig: Take 1 capsule (300 mg total) by mouth 3 (three) times daily.    Dispense:  90 capsule    Refill:  2  Continue with Cymbalta as prescribed  Orders:  No orders of the defined types were placed in this encounter.  Follow-up plan:   Return in about 3 months (around 09/24/2022) for Medication Management, in person.    Recent Visits No visits were found meeting these conditions. Showing recent visits within past 90 days and meeting all other requirements Today's Visits Date Type Provider Dept  06/25/22 Office Visit Edward Jolly, MD [facility]  Showing today's visits and meeting all other requirements Future Appointments No visits were found meeting these conditions. Showing future appointments within next 90 days and meeting all other requirements  I discussed the assessment and treatment plan with the patient. The patient was provided an opportunity to ask questions and all were answered. The patient agreed with the plan and demonstrated an understanding of the instructions.  Patient advised  to call back or seek an in-person evaluation if the symptoms or condition worsens.  Duration of encounter: .  Note by: Edward Jolly, MD Date: 06/25/2022; Time: 2:35 PM

## 2022-06-25 NOTE — Patient Instructions (Signed)

## 2022-07-12 ENCOUNTER — Other Ambulatory Visit (HOSPITAL_COMMUNITY): Payer: PPO

## 2022-08-19 ENCOUNTER — Other Ambulatory Visit: Payer: Self-pay | Admitting: Family Medicine

## 2022-09-11 ENCOUNTER — Telehealth: Payer: Self-pay | Admitting: Family Medicine

## 2022-09-11 ENCOUNTER — Ambulatory Visit: Payer: PPO | Admitting: Family Medicine

## 2022-09-11 ENCOUNTER — Other Ambulatory Visit: Payer: Self-pay | Admitting: Nurse Practitioner

## 2022-09-11 MED ORDER — NYSTATIN 100000 UNIT/GM EX POWD: CUTANEOUS | 0 refills | Status: DC

## 2022-09-11 NOTE — Telephone Encounter (Signed)
Refill on  nystatin (MYCOSTATIN/NYSTOP) powder send to [facility]

## 2022-09-11 NOTE — Telephone Encounter (Signed)
Done

## 2022-09-13 ENCOUNTER — Ambulatory Visit (HOSPITAL_COMMUNITY)
Admission: RE | Admit: 2022-09-13 | Discharge: 2022-09-13 | Disposition: A | Discharge status: Home / Self Care | Payer: PPO | Source: Ambulatory Visit | Attending: Family Medicine | Admitting: Family Medicine

## 2022-09-13 DIAGNOSIS — E785 Hyperlipidemia, unspecified: Secondary | ICD-10-CM | POA: Insufficient documentation

## 2022-09-13 DIAGNOSIS — I10 Essential (primary) hypertension: Secondary | ICD-10-CM | POA: Insufficient documentation

## 2022-09-24 ENCOUNTER — Encounter: Payer: Self-pay | Admitting: Student in an Organized Health Care Education/Training Program

## 2022-09-24 ENCOUNTER — Ambulatory Visit
Payer: PPO | Attending: Student in an Organized Health Care Education/Training Program | Admitting: Student in an Organized Health Care Education/Training Program

## 2022-09-24 VITALS — BP 148/89 | HR 72 | Temp 98.0°F | Resp 18 | Ht 68.0 in | Wt 248.0 lb

## 2022-09-24 DIAGNOSIS — M5416 Radiculopathy, lumbar region: Secondary | ICD-10-CM

## 2022-09-24 DIAGNOSIS — M961 Postlaminectomy syndrome, not elsewhere classified: Secondary | ICD-10-CM | POA: Diagnosis not present

## 2022-09-24 DIAGNOSIS — G8929 Other chronic pain: Secondary | ICD-10-CM | POA: Diagnosis not present

## 2022-09-24 DIAGNOSIS — Z9889 Other specified postprocedural states: Secondary | ICD-10-CM | POA: Diagnosis not present

## 2022-09-24 DIAGNOSIS — G894 Chronic pain syndrome: Secondary | ICD-10-CM | POA: Diagnosis not present

## 2022-09-24 DIAGNOSIS — M48062 Spinal stenosis, lumbar region with neurogenic claudication: Secondary | ICD-10-CM

## 2022-09-24 MED ORDER — OXYCODONE-ACETAMINOPHEN 10-325 MG PO TABS: 1.0000 | ORAL_TABLET | Freq: Two times a day (BID) | ORAL | 0 refills | Status: AC | PRN

## 2022-09-24 MED ORDER — METHOCARBAMOL 750 MG PO TABS: 750.0000 mg | ORAL_TABLET | Freq: Three times a day (TID) | ORAL | 5 refills | Status: DC | PRN

## 2022-09-24 MED ORDER — OXYCODONE-ACETAMINOPHEN 10-325 MG PO TABS
1.0000 | ORAL_TABLET | Freq: Two times a day (BID) | ORAL | 0 refills | Status: DC | PRN
Start: 2022-12-19 — End: 2023-01-07

## 2022-09-24 MED ORDER — GABAPENTIN 300 MG PO CAPS
300.0000 mg | ORAL_CAPSULE | Freq: Three times a day (TID) | ORAL | 5 refills | Status: DC
Start: 2022-09-24 — End: 2023-07-29

## 2022-09-24 NOTE — Progress Notes (Signed)
PROVIDER NOTE: Information contained herein reflects review and annotations entered in association with encounter. Interpretation of such information and data should be left to medically-trained personnel. Information provided to patient can be located elsewhere in the medical record under "Patient Instructions". Document created using STT-dictation technology, any transcriptional errors that may result from process are unintentional.    Patient: Lauren Greene  Service Category: E/M  Provider: Edward Jolly, MD  DOB: 1966-03-15  DOS: 09/24/2022  Specialty: Interventional Pain Management  MRN: [id number]  Setting: Ambulatory outpatient  PCP: Babs Sciara, MD  Type: Established Patient    Referring Provider: Babs Sciara, MD  Location: Office  Delivery: Face-to-face     HPI  [name], a 57 y.o. year old female, is here today because of her Chronic pain syndrome [G89.4]. [name] primary complain today 57 is [age] Back Pain and Foot Pain (right) Last encounter: My last encounter with her was on 06/25/22  Pertinent problems: [name] has Periprosthetic fracture around internal prosthetic left knee joint; Chronic back pain; Morbid obesity (HCC); Lumbar degenerative disc disease; Chronic, continuous use of opioids; Fibromyalgia; Lumbar spondylosis; Chronic pain syndrome; and Encounter for long-term opiate analgesic use on their pertinent problem list. Pain Assessment: Severity of Chronic pain is reported as a 8 /10. Location: Back Lower/denies. Onset: More than a month ago. Quality: Aching, Nagging. Timing: Constant. Modifying factor(s): nothing. Vitals:  height is 5\' 8"  (1.727 m) and weight is 248 lb (112.5 kg). Her temperature is 98 F (36.7 C). Her blood pressure is 148/89 (abnormal) and her pulse is 72. Her respiration is 18 and oxygen saturation is 98%.   Reason for encounter: medication management.    No change in medical history since last visit.  Patient's pain is at baseline.   Patient continues multimodal pain regimen as prescribed.  States that it provides pain relief and improvement in functional status.   Pharmacotherapy Assessment  Analgesic:  Percocet 20 mg BID (MME 60)    Monitoring: [place] PMP: PDMP reviewed during this encounter.       Pharmacotherapy: No side-effects or adverse reactions reported. Compliance: No problems identified. Effectiveness: Clinically acceptable.  Valerie Salts, RN  09/24/2022  1:54 PM  Sign when Signing Visit Nursing Pain Medication Assessment:  Safety precautions to be maintained throughout the outpatient stay will include: orient to surroundings, keep bed in low position, maintain call bell within reach at all times, provide assistance with transfer out of bed and ambulation.  Medication Inspection Compliance: Pill count conducted under aseptic conditions, in front of the patient. Neither the pills nor the bottle was removed from the patient's sight at any time. Once count was completed pills were immediately returned to the patient in their original bottle.  Medication: Oxycodone/APAP Pill/Patch Count:  113 of 120 pills remain Pill/Patch Appearance: Markings consistent with prescribed medication Bottle Appearance: Standard pharmacy container. Clearly labeled. Filled Date: 07 / 05 / 2024 Last Medication intake:  Today     UDS:  Summary  Date Value Ref Range Status  11/20/2021 Note  Final    Comment:    ==================================================================== [employer] [facility] (MW) ==================================================================== Test                             Result       Flag       Units  Drug Present and Declared for Prescription Verification   Amphetamine                    >  3300        EXPECTED   ng/mg creat    Amphetamine is available as a schedule II prescription drug.    Oxycodone                      >3300        EXPECTED   ng/mg creat   Oxymorphone                    1890          EXPECTED   ng/mg creat   Noroxycodone                   >3300        EXPECTED   ng/mg creat   Noroxymorphone                 386          EXPECTED   ng/mg creat    Sources of oxycodone are scheduled prescription medications.    Oxymorphone, noroxycodone, and noroxymorphone are expected    metabolites of oxycodone. Oxymorphone is also available as a    scheduled prescription medication.  ==================================================================== Test                      Result    Flag   Units      Ref Range   Creatinine              303              mg/dL      >=16 ==================================================================== Declared Medications:  The flagging and interpretation on this report are based on the  following declared medications.  Unexpected results may arise from  inaccuracies in the declared medications.   **Note: The testing scope of this panel includes these medications:   Amphetamine (Adderall)  Oxycodone (Percocet)   **Note: The testing scope of this panel does not include the  following reported medications:   Acetaminophen (Percocet)  Amlodipine (Norvasc)  Duloxetine (Cymbalta)  Lidocaine (Xylocaine)  Methocarbamol (Robaxin)  Nystatin (Mycostatin)  Omeprazole (Prilosec)  Rosuvastatin (Crestor)  Sulfadiazine (Silvadene)  Upadacitinib (Rinvoq) ==================================================================== For clinical consultation, please call [phone]. ====================================================================      ROS  Constitutional: Denies any fever or chills Gastrointestinal: No reported hemesis, hematochezia, vomiting, or acute GI distress Musculoskeletal:  Low back pain, right foot pain, right ankle pain, bilateral shoulder pain Neurological: No reported episodes of acute onset apraxia, aphasia, dysarthria, agnosia, amnesia, paralysis, loss of coordination, or loss of consciousness  Medication Review   DULoxetine, amLODipine, amphetamine-dextroamphetamine, clonazePAM, diclofenac, gabapentin, lidocaine, methocarbamol, nystatin, omeprazole, oxyCODONE-acetaminophen, rosuvastatin, silver sulfADIAZINE, and traZODone  History Review  Allergy: Ms. Corriher is allergic to wellbutrin [bupropion] and lyrica [pregabalin]. Drug: Ms. Mangels  reports no history of drug use. Alcohol:  reports current alcohol use. Tobacco:  reports that she quit smoking about 22 years ago. Her smoking use included cigarettes. She has a 20.00 pack-year smoking history. She has never used smokeless tobacco. Social: Ms. Wendt  reports that she quit smoking about 22 years ago. Her smoking use included cigarettes. She has a 20.00 pack-year smoking history. She has never used smokeless tobacco. She reports current alcohol use. She reports that she does not use drugs. Medical:  has a past medical history of ADHD (attention deficit hyperactivity disorder), Ankylosing spondylitis (HCC), Arthritis, Back pain, Broken femur (HCC) (08/27/2012), Chronic pain, GERD (gastroesophageal reflux disease), Heart  murmur, History of methicillin resistant staphylococcus aureus (MRSA) (2009), Hypertension, Migraine, Osteoarthritis, PVC (premature ventricular contraction), Raynaud disease, and Raynaud phenomenon (04/06/2018). Surgical: Ms. Montero  has a past surgical history that includes Total knee arthroplasty (Bilateral); Heel spur surgery; Back surgery; IM nailing femoral shaft fracture (Left, 08/27/2012); Femur IM nail (Left, 08/27/2012); Cervical biopsy w/ loop electrode excision (1990's); Endometrial biopsy (04-05-05 and 02-12-06); Tubal ligation; Shoulder surgery (Left); Vaginal hysterectomy (2018); Foot arthrodesis (Right, 02/01/2020); Achilles tendon lengthening (Right, 02/01/2020); Knee surgery; Bunionectomy; Spinal cord stimulator insertion (N/A, 02/26/2021); Hammer toe surgery (Right); Osteotomy; Bladder surgery; and Arthrodesis  (Right). Family: family history includes Bipolar disorder in her mother and sister; Breast cancer in her mother; Cancer in her father, mother, and another family member; Heart disease in her father; Heart failure in her father and another family member.  Laboratory Chemistry Profile   Renal Lab Results  Component Value Date   BUN 17 12/26/2021   CREATININE 0.70 12/26/2021   BCR SEE NOTE: 12/26/2021   GFRAA 115 08/30/2019   GFRNONAA >60 02/26/2021    Hepatic Lab Results  Component Value Date   AST 23 12/26/2021   ALT 18 12/26/2021   ALBUMIN 4.4 09/13/2020   ALKPHOS 123 (H) 09/13/2020   LIPASE 25 01/12/2016    Electrolytes Lab Results  Component Value Date   NA 141 12/26/2021   K 4.5 12/26/2021   CL 105 12/26/2021   CALCIUM 9.1 12/26/2021    Bone Lab Results  Component Value Date   VD25OH 31 01/07/2013    Inflammation (CRP: Acute Phase) (ESR: Chronic Phase) Lab Results  Component Value Date   CRP 2 09/13/2020   ESRSEDRATE 4 04/06/2018         Note: Above Lab results reviewed.  Recent Imaging Review  CT CARDIAC SCORING (SELF PAY ONLY) Addendum: ADDENDUM REPORT: 09/19/2022 22:27   EXAM:  OVER-READ INTERPRETATION  CT CHEST   The following report is an over-read performed by radiologist Dr.  Alcide Clever of [facility] on 09/19/2022. This over-read  does not include interpretation of cardiac or coronary anatomy or  pathology. The coronary calcium score interpretation by the  cardiologist is attached.   COMPARISON:  None.   FINDINGS:  Cardiovascular: There are no significant extracardiac vascular  findings.   Mediastinum/Nodes: There are no enlarged lymph nodes within the  visualized mediastinum.   Lungs/Pleura: There is no pleural effusion. The visualized lungs  appear clear.   Upper abdomen: No significant findings in the visualized upper  abdomen.   Musculoskeletal/Chest wall: No chest wall mass or suspicious osseous  findings within the  visualized chest.   IMPRESSION:  No significant extracardiac findings within the visualized chest.   Electronically Signed    By: Alcide Clever M.D.    On: 09/19/2022 22:27 Narrative: CLINICAL DATA:  Cardiovascular Disease Risk stratification  EXAM: Coronary Calcium Score  TECHNIQUE: A gated, non-contrast computed tomography scan of the heart was performed using 3mm slice thickness. Axial images were analyzed on a dedicated workstation. Calcium scoring of the coronary arteries was performed using the Agatston method.  FINDINGS: Coronary arteries: Normal origins.  Coronary Calcium Score:  Left main: 0  Left anterior descending artery: 0  Left circumflex artery: 0  Right coronary artery: 0  Total: 0  Percentile: 0  Pericardium: Normal.  Ascending Aorta: Normal caliber.  Non-cardiac: See separate report from [facility].  IMPRESSION: Coronary calcium score of 0. This was 0 percentile for age-, race-, and sex-matched controls.  RECOMMENDATIONS: Coronary artery calcium (CAC) score is a strong predictor of incident coronary heart disease (CHD) and provides predictive information beyond traditional risk factors. CAC scoring is reasonable to use in the decision to withhold, postpone, or initiate statin therapy in intermediate-risk or selected borderline-risk asymptomatic adults (age 23-75 years and LDL-C >=70 to <190 mg/dL) who do not have diabetes or established atherosclerotic cardiovascular disease (ASCVD).* In intermediate-risk (10-year ASCVD risk >=7.5% to <20%) adults or selected borderline-risk (10-year ASCVD risk >=5% to <7.5%) adults in whom a CAC score is measured for the purpose of making a treatment decision the following recommendations have been made:  If CAC=0, it is reasonable to withhold statin therapy and reassess in 5 to 10 years, as long as higher risk conditions are absent (diabetes mellitus, family history of premature CHD in first  degree relatives (males <55 years; females <65 years), cigarette smoking, or LDL >=190 mg/dL).  If CAC is 1 to 99, it is reasonable to initiate statin therapy for patients >=73 years of age.  If CAC is >=100 or >=75th percentile, it is reasonable to initiate statin therapy at any age.  Cardiology referral should be considered for patients with CAC scores >=400 or >=75th percentile.  *2018 AHA/ACC/AACVPR/AAPA/ABC/ACPM/ADA/AGS/APhA/ASPC/NLA/PCNA Guideline on the Management of Blood Cholesterol: A Report of the American College of Cardiology/American Heart Association Task Force on Clinical Practice Guidelines. J Am Coll Cardiol. 2019;73(24):3168-3209.  Armanda Magic, MD  Electronically Signed: By: Armanda Magic M.D. On: 09/13/2022 16:00 Note: Reviewed        Physical Exam  General appearance: Well nourished, well developed, and well hydrated. In no apparent acute distress Mental status: Alert, oriented x 3 (person, place, & time)       Respiratory: No evidence of acute respiratory distress Eyes: PERLA Vitals: BP (!) 148/89   Pulse 72   Temp 98 F (36.7 C)   Resp 18   Ht 5\' 8"  (1.727 m)   Wt 248 lb (112.5 kg)   LMP 03/19/2003   SpO2 98%   BMI 37.71 kg/m  BMI: Estimated body mass index is 37.71 kg/m as calculated from the following:   Height as of this encounter: 5\' 8"  (1.727 m).   Weight as of this encounter: 248 lb (112.5 kg). Ideal: Ideal body weight: 63.9 kg (140 lb 14 oz) Adjusted ideal body weight: 83.3 kg (183 lb 11.6 oz)   Thoracic Spine Area Exam  Skin & Axial Inspection: Well healed scar from previous spine surgery detected Alignment: Symmetrical Functional ROM: Unrestricted ROM Stability: No instability detected Muscle Tone/Strength: Functionally intact. No obvious neuro-muscular anomalies detected. Sensory (Neurological): Unimpaired Muscle strength & Tone: No palpable anomalies  Lumbar Spine Area Exam  Skin & Axial Inspection: Well healed scar from  previous spine surgery detected Alignment: Symmetrical Functional ROM: Unrestricted ROM       Stability: No instability detected Muscle Tone/Strength: Functionally intact. No obvious neuro-muscular anomalies detected. Sensory (Neurological): Unimpaired  Gait & Posture Assessment  Ambulation: Unassisted Gait: Relatively normal for age and body habitus Posture: WNL  Lower Extremity Exam    Side: Right lower extremity  Side: Left lower extremity  Stability: No instability observed          Stability: No instability observed          Skin & Extremity Inspection: Skin color, temperature, and hair growth are WNL. No peripheral edema or cyanosis. No masses, redness, swelling, asymmetry, or associated skin lesions. No contractures.  Skin & Extremity Inspection: Skin color, temperature,  and hair growth are WNL. No peripheral edema or cyanosis. No masses, redness, swelling, asymmetry, or associated skin lesions. No contractures.  Functional ROM: Pain restricted ROM  right ankle and foot          Functional ROM: Unrestricted ROM                  Muscle Tone/Strength: Functionally intact. No obvious neuro-muscular anomalies detected.  Muscle Tone/Strength: Functionally intact. No obvious neuro-muscular anomalies detected.  Sensory (Neurological): Neurogenic pain pattern        Sensory (Neurological): Unimpaired        DTR: Patellar: deferred today Achilles: deferred today Plantar: deferred today  DTR: Patellar: deferred today Achilles: deferred today Plantar: deferred today  Palpation: No palpable anomalies  Palpation: No palpable anomalies    Assessment   Status Diagnosis  Persistent Persistent Persistent 1. Chronic pain syndrome   2. S/P lumbar microdiscectomy (left L4/5 with micordissection)   3. Spinal stenosis, lumbar region, with neurogenic claudication   4. Failed back surgical syndrome   5. Chronic radicular lumbar pain   6. Lumbar radiculopathy           Plan of Care     Ms. TRISHA KEN has a current medication list which includes the following long-term medication(s): amlodipine, amphetamine-dextroamphetamine, amphetamine-dextroamphetamine, amphetamine-dextroamphetamine, clonazepam, duloxetine, omeprazole, rosuvastatin, trazodone, and gabapentin.  Pharmacotherapy (Medications Ordered): Meds ordered this encounter  Medications   oxyCODONE-acetaminophen (PERCOCET) 10-325 MG tablet    Sig: Take 1-2 tablets by mouth 2 (two) times daily as needed for pain (moderate pain).    Dispense:  120 tablet    Refill:  0   oxyCODONE-acetaminophen (PERCOCET) 10-325 MG tablet    Sig: Take 1-2 tablets by mouth 2 (two) times daily as needed for pain (moderate pain).    Dispense:  120 tablet    Refill:  0   oxyCODONE-acetaminophen (PERCOCET) 10-325 MG tablet    Sig: Take 1-2 tablets by mouth 2 (two) times daily as needed for pain (moderate pain).    Dispense:  120 tablet    Refill:  0   gabapentin (NEURONTIN) 300 MG capsule    Sig: Take 1 capsule (300 mg total) by mouth 3 (three) times daily.    Dispense:  90 capsule    Refill:  5   methocarbamol (ROBAXIN) 750 MG tablet    Sig: Take 1 tablet (750 mg total) by mouth every 8 (eight) hours as needed for muscle spasms.    Dispense:  90 tablet    Refill:  5  Continue with Cymbalta as prescribed  Orders:  No orders of the defined types were placed in this encounter.  Follow-up plan:   Return in about 4 months (around 01/16/2023) for Medication Management, in person.    Recent Visits No visits were found meeting these conditions. Showing recent visits within past 90 days and meeting all other requirements Today's Visits Date Type Provider Dept  09/24/22 Office Visit Edward Jolly, MD [facility]  Showing today's visits and meeting all other requirements Future Appointments No visits were found meeting these conditions. Showing future appointments within next 90 days and meeting all other  requirements  I discussed the assessment and treatment plan with the patient. The patient was provided an opportunity to ask questions and all were answered. The patient agreed with the plan and demonstrated an understanding of the instructions.  Patient advised to call back or seek an in-person evaluation if the symptoms or condition worsens.  Duration of encounter: .  Note by: Edward Jolly, MD Date: 09/24/2022; Time: 2:12 PM

## 2022-09-24 NOTE — Progress Notes (Signed)
Nursing Pain Medication Assessment:  Safety precautions to be maintained throughout the outpatient stay will include: orient to surroundings, keep bed in low position, maintain call bell within reach at all times, provide assistance with transfer out of bed and ambulation.  Medication Inspection Compliance: Pill count conducted under aseptic conditions, in front of the patient. Neither the pills nor the bottle was removed from the patient's sight at any time. Once count was completed pills were immediately returned to the patient in their original bottle.  Medication: Oxycodone/APAP Pill/Patch Count:  113 of 120 pills remain Pill/Patch Appearance: Markings consistent with prescribed medication Bottle Appearance: Standard pharmacy container. Clearly labeled. Filled Date: 07 / 05 / 2024 Last Medication intake:  Today

## 2022-09-27 ENCOUNTER — Ambulatory Visit (INDEPENDENT_AMBULATORY_CARE_PROVIDER_SITE_OTHER): Payer: PPO | Admitting: Family Medicine

## 2022-09-27 VITALS — BP 124/76 | HR 78 | Wt 253.0 lb

## 2022-09-27 DIAGNOSIS — I10 Essential (primary) hypertension: Secondary | ICD-10-CM | POA: Diagnosis not present

## 2022-09-27 DIAGNOSIS — I6529 Occlusion and stenosis of unspecified carotid artery: Secondary | ICD-10-CM

## 2022-09-27 DIAGNOSIS — F988 Other specified behavioral and emotional disorders with onset usually occurring in childhood and adolescence: Secondary | ICD-10-CM

## 2022-09-27 DIAGNOSIS — G542 Cervical root disorders, not elsewhere classified: Secondary | ICD-10-CM

## 2022-09-27 DIAGNOSIS — R61 Generalized hyperhidrosis: Secondary | ICD-10-CM

## 2022-09-27 DIAGNOSIS — G43809 Other migraine, not intractable, without status migrainosus: Secondary | ICD-10-CM

## 2022-09-27 DIAGNOSIS — M791 Myalgia, unspecified site: Secondary | ICD-10-CM | POA: Diagnosis not present

## 2022-09-27 DIAGNOSIS — E785 Hyperlipidemia, unspecified: Secondary | ICD-10-CM

## 2022-09-27 MED ORDER — AMPHETAMINE-DEXTROAMPHET ER 30 MG PO CP24: ORAL_CAPSULE | ORAL | 0 refills | Status: DC

## 2022-09-27 MED ORDER — TOPIRAMATE 50 MG PO TABS
50.0000 mg | ORAL_TABLET | Freq: Two times a day (BID) | ORAL | 5 refills | Status: DC
Start: 2022-09-27 — End: 2023-01-08

## 2022-09-27 MED ORDER — PRAVASTATIN SODIUM 20 MG PO TABS
ORAL_TABLET | ORAL | 1 refills | Status: DC
Start: 2022-09-27 — End: 2023-01-08

## 2022-09-27 MED ORDER — AMLODIPINE BESYLATE 2.5 MG PO TABS: 2.5000 mg | ORAL_TABLET | Freq: Every day | ORAL | 1 refills | Status: DC

## 2022-09-27 NOTE — Progress Notes (Signed)
Subjective:    Patient ID: Lauren Greene, female    DOB: 1965/03/27, 57 y.o.   MRN: [id number]  HPI This patient has adult ADD. Takes medication responsibly. Medication does help the patient focus in be more functional. Patient relates that they are or not abusing the medication or misusing the medication. The patient understands that if they're having any negative side effects such as elevated high blood pressure severe headaches they would need stop the medication follow-up immediately. They also understand that the prescriptions are to last for 3 months then the patient will need to follow-up before having further prescriptions.  Patient compliance yes  Does medication help patient function /attention better  yes  Side effects  no   Excessive sweating - Plan: C-reactive protein, TSH + free T4, Comprehensive metabolic panel, CBC with Differential  Hyperlipidemia, unspecified hyperlipidemia type - Plan: C-reactive protein, TSH + free T4, Comprehensive metabolic panel, CBC with Differential  Carotid atherosclerosis, unspecified laterality - Plan: C-reactive protein, Comprehensive metabolic panel, CBC with Differential  Attention deficit disorder, unspecified hyperactivity presence  Myalgia - Plan: C-reactive protein, TSH + free T4, Comprehensive metabolic panel, CBC with Differential  She does have a history of carotid artery atherosclerosis although minimal.  Her coronary calcium and looked good.  She did not tolerate statin well.  She has hyperlipidemia  She also is dealing with excessive sweating feels fatigued to run down on her coronary calcium the lung fields look good no adenopathy  She also has myalgia on her right trapezius present all the time but worse when she turns her head to the right she notices a lot of cracking and popping in her neck more than likely arthritis  Patient stated she has stopped taken rosuvastatin. Patient states she does not feel well when taking.  It makes her feel sluggish and upsets her stomach.     Review of Systems     Objective:   Physical Exam  General-in no acute distress Eyes-no discharge Lungs-respiratory rate normal, CTA CV-no murmurs,RRR Extremities skin warm dry no edema Neuro grossly normal Behavior normal, alert       Assessment & Plan:   1. Excessive sweating Check lab work await results Could be related to medicines but also could be - C-reactive protein - TSH + free T4 - Comprehensive metabolic panel - CBC with Differential  2. Hyperlipidemia, unspecified hyperlipidemia type We will try pravastatin 3 days a week if she tolerates this will adjust upward will look to do lab work in approximately 8 to 10 weeks for this - C-reactive protein - TSH + free T4 - Comprehensive metabolic panel - CBC with Differential  3. Carotid atherosclerosis, unspecified laterality Healthy diet regular activity pravastatin - C-reactive protein - Comprehensive metabolic panel - CBC with Differential  4. Attention deficit disorder, unspecified hyperactivity presence ADD meds refilled The patient was seen today as part of the visit regarding ADD.  Patient is stable on current regimen.  Appropriate prescriptions prescribed.  Medications were reviewed with the patient as well as compliance. Side effects were checked for. Discussion regarding effectiveness was held. Prescriptions were electronically sent in.  Patient reminded to follow-up in approximately 3 months.   Plans to [facility] law with drug registry was checked and verified while present with the patient.   5. Myalgia Myalgia in the trapezius more than likely cervical nerve root impingement - C-reactive protein - TSH + free T4 - Comprehensive metabolic panel - CBC with Differential  6. Cervical nerve  root impingement Offered x-rays patient defers gentle range of motion massage recommended

## 2022-09-28 LAB — COMPREHENSIVE METABOLIC PANEL
ALT: 13 IU/L (ref 0–32)
AST: 21 IU/L (ref 0–40)
Albumin: 4.2 g/dL (ref 3.8–4.9)
Alkaline Phosphatase: 143 IU/L — ABNORMAL HIGH (ref 44–121)
BUN/Creatinine Ratio: 26 — ABNORMAL HIGH (ref 9–23)
BUN: 19 mg/dL (ref 6–24)
Bilirubin Total: 0.2 mg/dL (ref 0.0–1.2)
CO2: 24 mmol/L (ref 20–29)
Calcium: 9.5 mg/dL (ref 8.7–10.2)
Chloride: 104 mmol/L (ref 96–106)
Creatinine, Ser: 0.74 mg/dL (ref 0.57–1.00)
Globulin, Total: 2.5 g/dL (ref 1.5–4.5)
Glucose: 76 mg/dL (ref 70–99)
Potassium: 4.7 mmol/L (ref 3.5–5.2)
Sodium: 143 mmol/L (ref 134–144)
Total Protein: 6.7 g/dL (ref 6.0–8.5)
eGFR: 94 mL/min/{1.73_m2} (ref >59)

## 2022-09-28 LAB — CBC WITH DIFFERENTIAL/PLATELET
Basophils Absolute: 0 10*3/uL (ref 0.0–0.2)
Basos: 1 %
EOS (ABSOLUTE): 0.1 10*3/uL (ref 0.0–0.4)
Eos: 2 %
Hematocrit: 42.2 % (ref 34.0–46.6)
Hemoglobin: 13.8 g/dL (ref 11.1–15.9)
Immature Grans (Abs): 0 10*3/uL (ref 0.0–0.1)
Immature Granulocytes: 1 %
Lymphocytes Absolute: 1.4 10*3/uL (ref 0.7–3.1)
Lymphs: 31 %
MCH: 27.8 pg (ref 26.6–33.0)
MCHC: 32.7 g/dL (ref 31.5–35.7)
MCV: 85 fL (ref 79–97)
Monocytes Absolute: 0.4 10*3/uL (ref 0.1–0.9)
Monocytes: 9 %
Neutrophils Absolute: 2.5 10*3/uL (ref 1.4–7.0)
Neutrophils: 56 %
Platelets: 230 10*3/uL (ref 150–450)
RBC: 4.96 x10E6/uL (ref 3.77–5.28)
RDW: 13.5 % (ref 11.7–15.4)
WBC: 4.4 10*3/uL (ref 3.4–10.8)

## 2022-09-28 LAB — TSH+FREE T4
Free T4: 1.16 ng/dL (ref 0.82–1.77)
TSH: 1.97 u[IU]/mL (ref 0.450–4.500)

## 2022-09-28 LAB — C-REACTIVE PROTEIN: CRP: 1 mg/L (ref 0–10)

## 2022-10-16 DIAGNOSIS — D485 Neoplasm of uncertain behavior of skin: Secondary | ICD-10-CM | POA: Diagnosis not present

## 2022-10-16 DIAGNOSIS — D0339 Melanoma in situ of other parts of face: Secondary | ICD-10-CM | POA: Diagnosis not present

## 2022-10-21 ENCOUNTER — Other Ambulatory Visit: Payer: Self-pay | Admitting: Family Medicine

## 2022-11-20 DIAGNOSIS — L309 Dermatitis, unspecified: Secondary | ICD-10-CM | POA: Diagnosis not present

## 2022-11-20 DIAGNOSIS — L821 Other seborrheic keratosis: Secondary | ICD-10-CM | POA: Diagnosis not present

## 2022-11-20 DIAGNOSIS — L814 Other melanin hyperpigmentation: Secondary | ICD-10-CM | POA: Diagnosis not present

## 2022-11-20 DIAGNOSIS — D0339 Melanoma in situ of other parts of face: Secondary | ICD-10-CM | POA: Diagnosis not present

## 2022-11-20 DIAGNOSIS — D225 Melanocytic nevi of trunk: Secondary | ICD-10-CM | POA: Diagnosis not present

## 2022-11-22 ENCOUNTER — Ambulatory Visit (INDEPENDENT_AMBULATORY_CARE_PROVIDER_SITE_OTHER): Payer: PPO

## 2022-11-22 VITALS — Ht 68.0 in | Wt 250.0 lb

## 2022-11-22 DIAGNOSIS — Z0000 Encounter for general adult medical examination without abnormal findings: Secondary | ICD-10-CM

## 2022-11-22 NOTE — Progress Notes (Signed)
 Subjective:   Lauren Greene is a 57 y.o. female who presents for an Initial Medicare Annual Wellness Visit.  Visit Complete: Virtual  I connected with  Lauren Greene on 11/22/22 by a audio enabled telemedicine application and verified that I am speaking with the correct person using two identifiers.  Patient Location: Home  Provider Location: Home Office  I discussed the limitations of evaluation and management by telemedicine. The patient expressed understanding and agreed to proceed.  Patient Medicare AWV questionnaire was completed by the patient on na; I have confirmed that all information answered by patient is correct and no changes since this date.  Review of Systems     Cardiac Risk Factors include: advanced age (>82men, >77 women);dyslipidemia;hypertension;obesity (BMI >30kg/m2);sedentary lifestyle;smoking/ tobacco exposure     Objective:    Today's Vitals   11/22/22 1424 11/22/22 1427  Weight: 250 lb (113.4 kg)   Height: 5\' 8"  (1.727 m)   PainSc:  8    Body mass index is 38.01 kg/m.     11/22/2022    2:23 PM 09/24/2022    1:53 PM 07/17/2021    2:15 PM 04/24/2021    2:56 PM 02/23/2021   10:30 AM 01/31/2021   10:26 AM 01/24/2021    8:06 AM  Advanced Directives  Does Patient Have a Medical Advance Directive? No Yes Yes Yes Yes Yes Yes  Type of Furniture conservator/restorer;Living will Living will;Healthcare Power of [employer] Power of [place];Living will     Copy of Healthcare Power of Attorney in Chart?    No - copy requested     Would patient like information on creating a medical advance directive? No - Patient declined          Current Medications (verified) Outpatient Encounter Medications as of 11/22/2022  Medication Sig   amLODipine (NORVASC) 2.5 MG tablet Take 1 tablet (2.5 mg total) by mouth daily.   amphetamine-dextroamphetamine (ADDERALL XR) 30 MG 24 hr capsule TAKE (2) CAPSULES BY MOUTH EVERY MORNING. MUST LAST 30 DAYS.    amphetamine-dextroamphetamine (ADDERALL XR) 30 MG 24 hr capsule 2 every morning as directed   amphetamine-dextroamphetamine (ADDERALL XR) 30 MG 24 hr capsule 2 every morning as directed   clonazePAM (KLONOPIN) 0.5 MG tablet Take 1 tablet (0.5 mg total) by mouth 2 (two) times daily as needed for anxiety.   diclofenac (VOLTAREN) 75 MG EC tablet Take 1 tablet (75 mg total) by mouth 2 (two) times daily.   DULoxetine (CYMBALTA) 60 MG capsule TAKE 2 CAPSULES BY MOUTH DAILY.   gabapentin (NEURONTIN) 300 MG capsule Take 1 capsule (300 mg total) by mouth 3 (three) times daily.   lidocaine (XYLOCAINE) 2 % solution TAKE 5 ML TWICE DAILY AS NEEDED FOR CANKER SORES.   lidocaine (XYLOCAINE) 5 % ointment APPLY TO AFFECTED AREA TWICE DAILY. NO MORE THAN 5 DAYS AT A TIME.   methocarbamol (ROBAXIN) 750 MG tablet Take 1 tablet (750 mg total) by mouth every 8 (eight) hours as needed for muscle spasms.   nystatin (MYCOSTATIN/NYSTOP) powder APPLY AS DIRECTED TO AFFECTED AREAS SEVERAL TIMES PER DAY.   omeprazole (PRILOSEC) 40 MG capsule Take 1 capsule (40 mg total) by mouth daily.   oxyCODONE-acetaminophen (PERCOCET) 10-325 MG tablet Take 1-2 tablets by mouth 2 (two) times daily as needed for pain (moderate pain).   [START ON 12/19/2022] oxyCODONE-acetaminophen (PERCOCET) 10-325 MG tablet Take 1-2 tablets by mouth 2 (two) times daily as needed for pain (moderate pain).  pravastatin (PRAVACHOL) 20 MG tablet One on Mon Wed and Friday   silver sulfADIAZINE (SSD) 1 % cream APPLY TO WOUNDS ON FEET TWICE DAILY.   topiramate (TOPAMAX) 50 MG tablet Take 1 tablet (50 mg total) by mouth 2 (two) times daily.   traZODone (DESYREL) 100 MG tablet TAKE 1 - 2 TABLETS BY MOUTH AT BEDTIME AS NEEDED FOR SLEEP   No facility-administered encounter medications on file as of 11/22/2022.    Allergies (verified) Wellbutrin [bupropion], Crestor [rosuvastatin], and Lyrica [pregabalin]   History: Past Medical History:  Diagnosis Date    ADHD (attention deficit hyperactivity disorder)    Ankylosing spondylitis (HCC)    Arthritis    Back pain    Broken femur (HCC) 08/27/2012   Chronic pain    chronic opioid use   GERD (gastroesophageal reflux disease)    Heart murmur    asymptomatic   History of methicillin resistant staphylococcus aureus (MRSA) 2009   Hypertension    Migraine    Osteoarthritis    PVC (premature ventricular contraction)    Raynaud disease    Raynaud phenomenon 04/06/2018   [facility] podiatry diagnosed 2020   Past Surgical History:  Procedure Laterality Date   ACHILLES TENDON LENGTHENING Right 02/01/2020   Procedure: ACHILLES TENDON LENGTHENING;  Surgeon: Terance Hart, MD;  Location: [facility];  Service: Orthopedics;  Laterality: Right;   ARTHRODESIS Right    BACK SURGERY     BLADDER SURGERY     BUNIONECTOMY     CERVICAL BIOPSY  W/ LOOP ELECTRODE EXCISION  1990's   in [place], [place]   ENDOMETRIAL BIOPSY  04-05-05 and 02-12-06   04-05-05 benign but too rapid growth. RXd Provera and repeat Bx 43mo. 02-12-06 Bx benign--Dr. Leda Quail   FEMUR IM NAIL Left 08/27/2012   Procedure: INTRAMEDULLARY (IM) RETROGRADE FEMORAL NAILING;  Surgeon: Kathryne Hitch, MD;  Location: [facility] OR;  Service: Orthopedics;  Laterality: Left;   FOOT ARTHRODESIS Right 02/01/2020   Procedure: TRIPLE ARTHRODESIS,  TENDO ACHILLES LENGTHENING;  Surgeon: Terance Hart, MD;  Location: [facility];  Service: Orthopedics;  Laterality: Right;  LENGTH OF SURGERY: 4 HOURS   HAMMER TOE SURGERY Right    HEEL SPUR SURGERY     IM NAILING FEMORAL SHAFT FRACTURE Left 08/27/2012   Dr Magnus Ivan   KNEE SURGERY     multiple   OSTEOTOMY     SHOULDER SURGERY Left    SPINAL CORD STIMULATOR INSERTION N/A 02/26/2021   Procedure: THORACIC SPINAL CORD STIMULATOR PLACEMENT, PULSE GENERATOR;  Surgeon: Lucy Chris, MD;  Location: [facility] ORS;  Service: Neurosurgery;  Laterality: N/A;   TOTAL KNEE  ARTHROPLASTY Bilateral    2009 and 2012   TUBAL LIGATION     VAGINAL HYSTERECTOMY  2018   Family History  Problem Relation Age of Onset   Cancer Mother    Breast cancer Mother    Bipolar disorder Mother    Heart failure Father    Cancer Father    Heart disease Father    Bipolar disorder Sister    Cancer Other    Heart failure Other    Social History   Socioeconomic History   Marital status: Divorced    Spouse name: Not on file   Number of children: Not on file   Years of education: Not on file   Highest education level: 12th grade  Occupational History   Not on file  Tobacco Use   Smoking status: Former  Current packs/day: 0.00    Average packs/day: 2.0 packs/day for 10.0 years (20.0 ttl pk-yrs)    Types: Cigarettes    Start date: 08/17/1990    Quit date: 08/16/2000    Years since quitting: 22.2   Smokeless tobacco: Never  Vaping Use   Vaping status: Never Used  Substance and Sexual Activity   Alcohol use: Yes    Comment: rarely   Drug use: No   Sexual activity: Not Currently    Partners: Female    Birth control/protection: Post-menopausal  Other Topics Concern   Not on file  Social History Narrative   Not on file   Social Determinants of Health   Financial Resource Strain: Low Risk  (11/22/2022)   Overall Financial Resource Strain (CARDIA)    Difficulty of Paying Living Expenses: Not hard at all  Food Insecurity: No Food Insecurity (11/22/2022)   Hunger Vital Sign    Worried About Running Out of Food in the Last Year: Never true    Ran Out of Food in the Last Year: Never true  Transportation Needs: No Transportation Needs (11/22/2022)   PRAPARE - Administrator, Civil Service (Medical): No    Lack of Transportation (Non-Medical): No  Physical Activity: Inactive (11/22/2022)   Exercise Vital Sign    Days of Exercise per Week: 0 days    Minutes of Exercise per Session: 0 min  Stress: No Stress Concern Present (11/22/2022)   [employer] of  Occupational Health - Occupational Stress Questionnaire    Feeling of Stress : Not at all  Social Connections: Socially Isolated (11/22/2022)   Social Connection and Isolation Panel [NHANES]    Frequency of Communication with Friends and Family: Twice a week    Frequency of Social Gatherings with Friends and Family: Never    Attends Religious Services: Never    Database administrator or Organizations: No    Attends Engineer, structural: Never    Marital Status: Living with partner    Tobacco Counseling Counseling given: Yes   Clinical Intake:  Pre-visit preparation completed: Yes  Pain : 0-10 Pain Score: 8  Pain Type: Chronic pain Pain Location:  (per patient everywhere) Pain Orientation:  (per patient pain is all over her body) Pain Descriptors / Indicators: Constant Pain Onset: More than a month ago Pain Frequency: Constant     BMI - recorded: 38.01 Nutritional Status: BMI > 30  Obese Nutritional Risks: None Diabetes: No  How often do you need to have someone help you when you read instructions, pamphlets, or other written materials from your doctor or pharmacy?: 1 - Never  Interpreter Needed?: No  Information entered by ::  Lauren Greene, CMA   Activities of Daily Living    11/22/2022    2:31 PM  In your present state of health, do you have any difficulty performing the following activities:  Hearing? 0  Vision? 0  Difficulty concentrating or making decisions? 0  Walking or climbing stairs? 1  Dressing or bathing? 0  Doing errands, shopping? 0  Preparing Food and eating ? N  Using the Toilet? N  In the past six months, have you accidently leaked urine? N  Do you have problems with loss of bowel control? N  Managing your Medications? N  Managing your Finances? N  Housekeeping or managing your Housekeeping? N    Patient Care Team: Babs Sciara, MD as PCP - General (Family Medicine)  Indicate any recent Medical Services you may  have received  from other than [facility] providers in the past year (date may be approximate).     Assessment:   This is a routine wellness examination for Lauren Greene.  Hearing/Vision screen Hearing Screening - Comments:: Patient denies any hearing difficulties.   Vision Screening - Comments:: Patient states she doesn't have any vision difficulties and declines referral to establish with an eye doctor    Goals Addressed             This Visit's Progress    Patient Stated       Patient refused to set a goal       Depression Screen    11/22/2022    2:29 PM 09/27/2022   10:51 AM 09/24/2022    1:53 PM 06/12/2022    1:55 PM 04/18/2022   11:11 AM 03/05/2022    2:06 PM 07/17/2021    2:15 PM  PHQ 2/9 Scores  PHQ - 2 Score 0 2 0 6 6 5  0  PHQ- 9 Score 0 6  15 19 15      Fall Risk    11/22/2022    2:31 PM 09/27/2022   10:51 AM 09/24/2022    1:53 PM 06/25/2022    1:51 PM 04/18/2022   11:11 AM  Fall Risk   Falls in the past year? 1 1 0 1 1  Number falls in past yr: 1 1  1 1   Injury with Fall? 1 0  0 0  Risk for fall due to : History of fall(s);Impaired balance/gait;Orthopedic patient;Impaired mobility   Impaired balance/gait;Impaired mobility   Follow up Education provided;Falls prevention discussed   Falls evaluation completed     MEDICARE RISK AT HOME: Medicare Risk at Home Any stairs in or around the home?: Yes If so, are there any without handrails?: No Home free of loose throw rugs in walkways, pet beds, electrical cords, etc?: Yes Adequate lighting in your home to reduce risk of falls?: Yes Life alert?: No Use of a cane, walker or w/c?: Yes Grab bars in the bathroom?: Yes Shower chair or bench in shower?: Yes Elevated toilet seat or a handicapped toilet?: No  TIMED UP AND GO:  Was the test performed? No    Cognitive Function:        11/22/2022    2:28 PM  6CIT Screen  What Year? 0 points  What month? 0 points  What time? 0 points  Count back from 20 0 points  Months in reverse 0 points   Repeat phrase 0 points  Total Score 0 points    Immunizations Immunization History  Administered Date(s) Administered   Moderna Sars-Covid-2 Vaccination 05/22/2019, 06/23/2019   Td 08/03/2013   Tdap 04/18/2022   Zoster Recombinant(Shingrix) 01/10/2018, 04/06/2018    TDAP status: Up to date  Flu Vaccine status: Due, Education has been provided regarding the importance of this vaccine. Advised may receive this vaccine at local pharmacy or Health Dept. Aware to provide a copy of the vaccination record if obtained from local pharmacy or Health Dept. Verbalized acceptance and understanding.  Pneumococcal vaccine status: Not age appropriate for this patient.    Covid-19 vaccine status: Information provided on how to obtain vaccines.   Qualifies for Shingles Vaccine? No   Zostavax completed No   Shingrix Completed?: Yes  Screening Tests Health Maintenance  Topic Date Due   Medicare Annual Wellness (AWV)  Never done   HIV Screening  Never done   INFLUENZA VACCINE  Never done   MAMMOGRAM  01/12/2023   Fecal DNA (Cologuard)  06/30/2023   DTaP/Tdap/Td (3 - Td or Tdap) 04/18/2032   Hepatitis C Screening  Completed   Zoster Vaccines- Shingrix  Completed   HPV VACCINES  Aged Out   PAP SMEAR-Modifier  Discontinued   COVID-19 Vaccine  Discontinued    Health Maintenance  Health Maintenance Due  Topic Date Due   Medicare Annual Wellness (AWV)  Never done   HIV Screening  Never done   INFLUENZA VACCINE  Never done    Colorectal Cancer Screening: Patient declined referral to GI for a colonoscopy     Mammogram Status: patient declined referral for sceening today   Bone Density Screening: Not age appropriate for this patient.    Lung Cancer Screening: (Low Dose CT Chest recommended if Age 23-80 years, 20 pack-year currently smoking OR have quit w/in 15years.) does not qualify.   Lung Cancer Screening Referral: patient declined   Additional Screening:  Hepatitis C  Screening: does not qualify; Completed 08/24/2021  Vision Screening: Recommended annual ophthalmology exams for early detection of glaucoma and other disorders of the eye. Is the patient up to date with their annual eye exam?  No  Who is the provider or what is the name of the office in which the patient attends annual eye exams? na If pt is not established with a provider, would they like to be referred to a provider to establish care? No .   Dental Screening: Recommended annual dental exams for proper oral hygiene  Diabetic Foot Exam: na  Community Resource Referral / Chronic Care Management: CRR required this visit?  No   CCM required this visit?  No     Plan:     I have personally reviewed and noted the following in the patient's chart:   Medical and social history Use of alcohol, tobacco or illicit drugs  Current medications and supplements including opioid prescriptions. Patient is currently taking opioid prescriptions. Information provided to patient regarding non-opioid alternatives. Patient advised to discuss non-opioid treatment plan with their provider. Functional ability and status Nutritional status Physical activity Advanced directives List of other physicians Hospitalizations, surgeries, and ER visits in previous 12 months Vitals Screenings to include cognitive, depression, and falls Referrals and appointments  In addition, I have reviewed and discussed with patient certain preventive protocols, quality metrics, and best practice recommendations. A written personalized care plan for preventive services as well as general preventive health recommendations were provided to patient.     Jordan Hawks Steph Cheadle, CMA   11/22/2022   After Visit Summary: (MyChart) Due to this being a telephonic visit, the after visit summary with patients personalized plan was offered to patient via MyChart   Nurse Notes: Patient refused all health screenings. She refuses future  AWV

## 2022-11-22 NOTE — Patient Instructions (Addendum)
Lauren Greene , Thank you for taking time to come for your Medicare Wellness Visit. I appreciate your ongoing commitment to your health goals. Please review the following plan we discussed and let me know if I can assist you in the future.   Referrals/Orders/Follow-Ups/Clinician Recommendations:   This is a list of the screening recommended for you and due dates:  Health Maintenance  Topic Date Due   Medicare Annual Wellness Visit  Never done   HIV Screening  Never done   Flu Shot  Never done   Mammogram  01/12/2023   Cologuard (Stool DNA test)  06/30/2023   DTaP/Tdap/Td vaccine (3 - Td or Tdap) 04/18/2032   Hepatitis C Screening  Completed   Zoster (Shingles) Vaccine  Completed   HPV Vaccine  Aged Out   Pap Smear  Discontinued   COVID-19 Vaccine  Discontinued    Advanced directives: (Declined) Advance directive discussed with you today. Even though you declined this today, please call our office should you change your mind, and we can give you the proper paperwork for you to fill out.  Next Medicare Annual Wellness Visit scheduled for next year: No  Preventive Care 80-58 Years Old, Female Preventive care refers to lifestyle choices and visits with your health care provider that can promote health and wellness. Preventive care visits are also called wellness exams. What can I expect for my preventive care visit? Counseling Your health care provider may ask you questions about your: Medical history, including: Past medical problems. Family medical history. Pregnancy history. Current health, including: Menstrual cycle. Method of birth control. Emotional well-being. Home life and relationship well-being. Sexual activity and sexual health. Lifestyle, including: Alcohol, nicotine or tobacco, and drug use. Access to firearms. Diet, exercise, and sleep habits. Work and work Astronomer. Sunscreen use. Safety issues such as seatbelt and bike helmet use. Physical exam Your  health care provider will check your: Height and weight. These may be used to calculate your BMI (body mass index). BMI is a measurement that tells if you are at a healthy weight. Waist circumference. This measures the distance around your waistline. This measurement also tells if you are at a healthy weight and may help predict your risk of certain diseases, such as type 2 diabetes and high blood pressure. Heart rate and blood pressure. Body temperature. Skin for abnormal spots. What immunizations do I need?  Vaccines are usually given at various ages, according to a schedule. Your health care provider will recommend vaccines for you based on your age, medical history, and lifestyle or other factors, such as travel or where you work. What tests do I need? Screening Your health care provider may recommend screening tests for certain conditions. This may include: Lipid and cholesterol levels. Diabetes screening. This is done by checking your blood sugar (glucose) after you have not eaten for a while (fasting). Pelvic exam and Pap test. Hepatitis B test. Hepatitis C test. HIV (human immunodeficiency virus) test. STI (sexually transmitted infection) testing, if you are at risk. Lung cancer screening. Colorectal cancer screening. Mammogram. Talk with your health care provider about when you should start having regular mammograms. This may depend on whether you have a family history of breast cancer. BRCA-related cancer screening. This may be done if you have a family history of breast, ovarian, tubal, or peritoneal cancers. Bone density scan. This is done to screen for osteoporosis. Talk with your health care provider about your test results, treatment options, and if necessary, the need for more  tests. Follow these instructions at home: Eating and drinking  Eat a diet that includes fresh fruits and vegetables, whole grains, lean protein, and low-fat dairy products. Take vitamin and mineral  supplements as recommended by your health care provider. Do not drink alcohol if: Your health care provider tells you not to drink. You are pregnant, may be pregnant, or are planning to become pregnant. If you drink alcohol: Limit how much you have to 0-1 drink a day. Know how much alcohol is in your drink. In the [place] one drink equals one 12 oz bottle of beer (355 mL), one 5 oz glass of wine (148 mL), or one 1 oz glass of hard liquor (44 mL). Lifestyle Brush your teeth every morning and night with fluoride toothpaste. Floss one time each day. Exercise for at least 30 minutes 5 or more days each week. Do not use any products that contain nicotine or tobacco. These products include cigarettes, chewing tobacco, and vaping devices, such as e-cigarettes. If you need help quitting, ask your health care provider. Do not use drugs. If you are sexually active, practice safe sex. Use a condom or other form of protection to prevent STIs. If you do not wish to become pregnant, use a form of birth control. If you plan to become pregnant, see your health care provider for a prepregnancy visit. Take aspirin only as told by your health care provider. Make sure that you understand how much to take and what form to take. Work with your health care provider to find out whether it is safe and beneficial for you to take aspirin daily. Find healthy ways to manage stress, such as: Meditation, yoga, or listening to music. Journaling. Talking to a trusted person. Spending time with friends and family. Minimize exposure to UV radiation to reduce your risk of skin cancer. Safety Always wear your seat belt while driving or riding in a vehicle. Do not drive: If you have been drinking alcohol. Do not ride with someone who has been drinking. When you are tired or distracted. While texting. If you have been using any mind-altering substances or drugs. Wear a helmet and other protective equipment during sports  activities. If you have firearms in your house, make sure you follow all gun safety procedures. Seek help if you have been physically or sexually abused. What's next? Visit your health care provider once a year for an annual wellness visit. Ask your health care provider how often you should have your eyes and teeth checked. Stay up to date on all vaccines. This information is not intended to replace advice given to you by your health care provider. Make sure you discuss any questions you have with your health care provider. Document Revised: 08/30/2020 Document Reviewed: 08/30/2020 Elsevier Patient Education  2024 [employer] Managing Pain Without Opioids Opioids are strong medicines used to treat moderate to severe pain. For some people, especially those who have long-term (chronic) pain, opioids may not be the best choice for pain management due to: Side effects like nausea, constipation, and sleepiness. The risk of addiction (opioid use disorder). The longer you take opioids, the greater your risk of addiction. Pain that lasts for more than 3 months is called chronic pain. Managing chronic pain usually requires more than one approach and is often provided by a team of health care providers working together (multidisciplinary approach). Pain management may be done at a pain management center or pain clinic. How to manage pain without the use of opioids Use  non-opioid medicines Non-opioid medicines for pain may include: Over-the-counter or prescription non-steroidal anti-inflammatory drugs (NSAIDs). These may be the first medicines used for pain. They work well for muscle and bone pain, and they reduce swelling. Acetaminophen. This over-the-counter medicine may work well for milder pain but not swelling. Antidepressants. These may be used to treat chronic pain. A certain type of antidepressant (tricyclics) is often used. These medicines are given in lower doses for pain than when used for  depression. Anticonvulsants. These are usually used to treat seizures but may also reduce nerve (neuropathic) pain. Muscle relaxants. These relieve pain caused by sudden muscle tightening (spasms). You may also use a pain medicine that is applied to the skin as a patch, cream, or gel (topical analgesic), such as a numbing medicine. These may cause fewer side effects than medicines taken by mouth. Do certain therapies as directed Some therapies can help with pain management. They include: Physical therapy. You will do exercises to gain strength and flexibility. A physical therapist may teach you exercises to move and stretch parts of your body that are weak, stiff, or painful. You can learn these exercises at physical therapy visits and practice them at home. Physical therapy may also involve: Massage. Heat wraps or applying heat or cold to affected areas. Electrical signals that interrupt pain signals (transcutaneous electrical nerve stimulation, TENS). Weak lasers that reduce pain and swelling (low-level laser therapy). Signals from your body that help you learn to regulate pain (biofeedback). Occupational therapy. This helps you to learn ways to function at home and work with less pain. Recreational therapy. This involves trying new activities or hobbies, such as a physical activity or drawing. Mental health therapy, including: Cognitive behavioral therapy (CBT). This helps you learn coping skills for dealing with pain. Acceptance and commitment therapy (ACT) to change the way you think and react to pain. Relaxation therapies, including muscle relaxation exercises and mindfulness-based stress reduction. Pain management counseling. This may be individual, family, or group counseling.  Receive medical treatments Medical treatments for pain management include: Nerve block injections. These may include a pain blocker and anti-inflammatory medicines. You may have injections: Near the spine to  relieve chronic back or neck pain. Into joints to relieve back or joint pain. Into nerve areas that supply a painful area to relieve body pain. Into muscles (trigger point injections) to relieve some painful muscle conditions. A medical device placed near your spine to help block pain signals and relieve nerve pain or chronic back pain (spinal cord stimulation device). Acupuncture. Follow these instructions at home Medicines Take over-the-counter and prescription medicines only as told by your health care provider. If you are taking pain medicine, ask your health care providers about possible side effects to watch out for. Do not drive or use heavy machinery while taking prescription opioid pain medicine. Lifestyle  Do not use drugs or alcohol to reduce pain. If you drink alcohol, limit how much you have to: 0-1 drink a day for women who are not pregnant. 0-2 drinks a day for men. Know how much alcohol is in a drink. In the [place] one drink equals one 12 oz bottle of beer (355 mL), one 5 oz glass of wine (148 mL), or one 1 oz glass of hard liquor (44 mL). Do not use any products that contain nicotine or tobacco. These products include cigarettes, chewing tobacco, and vaping devices, such as e-cigarettes. If you need help quitting, ask your health care provider. Eat a healthy diet and maintain  a healthy weight. Poor diet and excess weight may make pain worse. Eat foods that are high in fiber. These include fresh fruits and vegetables, whole grains, and beans. Limit foods that are high in fat and processed sugars, such as fried and sweet foods. Exercise regularly. Exercise lowers stress and may help relieve pain. Ask your health care provider what activities and exercises are safe for you. If your health care provider approves, join an exercise class that combines movement and stress reduction. Examples include yoga and tai chi. Get enough sleep. Lack of sleep may make pain worse. Lower stress  as much as possible. Practice stress reduction techniques as told by your therapist. General instructions Work with all your pain management providers to find the treatments that work best for you. You are an important member of your pain management team. There are many things you can do to reduce pain on your own. Consider joining an online or in-person support group for people who have chronic pain. Keep all follow-up visits. This is important. Where to find more information You can find more information about managing pain without opioids from: American Academy of Pain Medicine: painmed.org Institute for Chronic Pain: instituteforchronicpain.org American Chronic Pain Association: theacpa.org Contact a health care provider if: You have side effects from pain medicine. Your pain gets worse or does not get better with treatments or home therapy. You are struggling with anxiety or depression. Summary Many types of pain can be managed without opioids. Chronic pain may respond better to pain management without opioids. Pain is best managed when you and a team of health care providers work together. Pain management without opioids may include non-opioid medicines, medical treatments, physical therapy, mental health therapy, and lifestyle changes. Tell your health care providers if your pain gets worse or is not being managed well enough. This information is not intended to replace advice given to you by your health care provider. Make sure you discuss any questions you have with your health care provider. Document Revised: 06/14/2020 Document Reviewed: 06/14/2020 Elsevier Patient Education  2024 [employer].

## 2022-12-02 ENCOUNTER — Telehealth: Payer: Self-pay | Admitting: Family Medicine

## 2022-12-02 MED ORDER — DICLOFENAC SODIUM 75 MG PO TBEC: 75.0000 mg | DELAYED_RELEASE_TABLET | Freq: Two times a day (BID) | ORAL | 5 refills | Status: DC

## 2022-12-02 NOTE — Telephone Encounter (Signed)
Refill on diclofenac (VOLTAREN) 75 MG EC tablet  Send [facility]

## 2022-12-02 NOTE — Telephone Encounter (Signed)
May give 5 refills please

## 2022-12-02 NOTE — Telephone Encounter (Signed)
Received via fax Rx request: Prescription sent electronically to pharmacy  

## 2022-12-04 DIAGNOSIS — L989 Disorder of the skin and subcutaneous tissue, unspecified: Secondary | ICD-10-CM | POA: Diagnosis not present

## 2022-12-04 DIAGNOSIS — D485 Neoplasm of uncertain behavior of skin: Secondary | ICD-10-CM | POA: Diagnosis not present

## 2022-12-04 DIAGNOSIS — L905 Scar conditions and fibrosis of skin: Secondary | ICD-10-CM | POA: Diagnosis not present

## 2022-12-04 DIAGNOSIS — C44329 Squamous cell carcinoma of skin of other parts of face: Secondary | ICD-10-CM | POA: Diagnosis not present

## 2022-12-04 DIAGNOSIS — C4339 Malignant melanoma of other parts of face: Secondary | ICD-10-CM | POA: Diagnosis not present

## 2023-01-02 ENCOUNTER — Other Ambulatory Visit: Payer: Self-pay | Admitting: Nurse Practitioner

## 2023-01-07 ENCOUNTER — Ambulatory Visit
Payer: PPO | Attending: Student in an Organized Health Care Education/Training Program | Admitting: Student in an Organized Health Care Education/Training Program

## 2023-01-07 ENCOUNTER — Encounter: Payer: Self-pay | Admitting: Student in an Organized Health Care Education/Training Program

## 2023-01-07 VITALS — BP 130/87 | HR 80 | Temp 97.3°F | Ht 68.0 in | Wt 250.0 lb

## 2023-01-07 DIAGNOSIS — M5416 Radiculopathy, lumbar region: Secondary | ICD-10-CM | POA: Diagnosis not present

## 2023-01-07 DIAGNOSIS — M961 Postlaminectomy syndrome, not elsewhere classified: Secondary | ICD-10-CM | POA: Diagnosis not present

## 2023-01-07 DIAGNOSIS — M48062 Spinal stenosis, lumbar region with neurogenic claudication: Secondary | ICD-10-CM | POA: Insufficient documentation

## 2023-01-07 DIAGNOSIS — G894 Chronic pain syndrome: Secondary | ICD-10-CM | POA: Diagnosis not present

## 2023-01-07 DIAGNOSIS — G8929 Other chronic pain: Secondary | ICD-10-CM | POA: Diagnosis not present

## 2023-01-07 DIAGNOSIS — Z9889 Other specified postprocedural states: Secondary | ICD-10-CM | POA: Insufficient documentation

## 2023-01-07 MED ORDER — OXYCODONE-ACETAMINOPHEN 10-325 MG PO TABS
1.0000 | ORAL_TABLET | Freq: Two times a day (BID) | ORAL | 0 refills | Status: DC | PRN
Start: 2023-04-02 — End: 2023-04-29

## 2023-01-07 MED ORDER — OXYCODONE-ACETAMINOPHEN 10-325 MG PO TABS
1.0000 | ORAL_TABLET | Freq: Two times a day (BID) | ORAL | 0 refills | Status: AC | PRN
Start: 2023-03-03 — End: 2023-04-02

## 2023-01-07 MED ORDER — OXYCODONE-ACETAMINOPHEN 10-325 MG PO TABS
1.0000 | ORAL_TABLET | Freq: Two times a day (BID) | ORAL | 0 refills | Status: AC | PRN
Start: 2023-02-01 — End: 2023-03-03

## 2023-01-07 NOTE — Patient Instructions (Signed)

## 2023-01-07 NOTE — Progress Notes (Signed)
Nursing Pain Medication Assessment:  Safety precautions to be maintained throughout the outpatient stay will include: orient to surroundings, keep bed in low position, maintain call bell within reach at all times, provide assistance with transfer out of bed and ambulation.  Medication Inspection Compliance: Pill count conducted under aseptic conditions, in front of the patient. Neither the pills nor the bottle was removed from the patient's sight at any time. Once count was completed pills were immediately returned to the patient in their original bottle.  Medication: Oxycodone/APAP Pill/Patch Count:  100 of 120 pills remain Pill/Patch Appearance: Markings consistent with prescribed medication Bottle Appearance: Standard pharmacy container. Clearly labeled. Filled Date: 34 / 17 / 2024 Last Medication intake:  TodaySafety precautions to be maintained throughout the outpatient stay will include: orient to surroundings, keep bed in low position, maintain call bell within reach at all times, provide assistance with transfer out of bed and ambulation.

## 2023-01-07 NOTE — Progress Notes (Signed)
PROVIDER NOTE: Information contained herein reflects review and annotations entered in association with encounter. Interpretation of such information and data should be left to medically-trained personnel. Information provided to patient can be located elsewhere in the medical record under "Patient Instructions". Document created using STT-dictation technology, any transcriptional errors that may result from process are unintentional.    Patient: Lauren Greene  Service Category: E/M  Provider: Edward Jolly, MD  DOB: 1965-04-01  DOS: 01/07/2023  Specialty: Interventional Pain Management  MRN: [id number]  Setting: Ambulatory outpatient  PCP: Babs Sciara, MD  Type: Established Patient    Referring Provider: Babs Sciara, MD  Location: Office  Delivery: Face-to-face     HPI  Lauren Greene, a 57 y.o. year old female, is here today because of her S/P lumbar microdiscectomy [Z98.890]. [name] primary complain today is Back Pain Last encounter: My last encounter with her was on 09/24/2022  Pertinent problems: [name] has Periprosthetic fracture around internal prosthetic left knee joint; Chronic back pain; Morbid obesity (HCC); Lumbar degenerative disc disease; Chronic, continuous use of opioids; Fibromyalgia; Lumbar spondylosis; Chronic pain syndrome; and Encounter for long-term opiate analgesic use on their pertinent problem list. Pain Assessment: Severity of Chronic pain is reported as a 7 /10. Location: Back Left, Right/Denies. Onset: More than a month ago. Quality: Aching. Timing: Constant. Modifying factor(s): Meds and laying down. Vitals:  height is 5\' 8"  (1.727 m) and weight is 250 lb (113.4 kg). Her temperature is 97.3 F (36.3 C) (abnormal). Her blood pressure is 130/87 and her pulse is 80. Her oxygen saturation is 100%.   Reason for encounter: medication management.    Patient had a melanoma of her face removed with dermatology.  She is recovering from this procedure.   Patient's pain is at baseline.  Patient continues multimodal pain regimen as prescribed.  States that it provides pain relief and improvement in functional status.   Pharmacotherapy Assessment  Analgesic:  Percocet 20 mg BID (MME 60)    Monitoring: [place] PMP: PDMP reviewed during this encounter.       Pharmacotherapy: No side-effects or adverse reactions reported. Compliance: No problems identified. Effectiveness: Clinically acceptable.  Brigitte Pulse, RN  01/07/2023  1:52 PM  Sign when Signing Visit Nursing Pain Medication Assessment:  Safety precautions to be maintained throughout the outpatient stay will include: orient to surroundings, keep bed in low position, maintain call bell within reach at all times, provide assistance with transfer out of bed and ambulation.  Medication Inspection Compliance: Pill count conducted under aseptic conditions, in front of the patient. Neither the pills nor the bottle was removed from the patient's sight at any time. Once count was completed pills were immediately returned to the patient in their original bottle.  Medication: Oxycodone/APAP Pill/Patch Count:  100 of 120 pills remain Pill/Patch Appearance: Markings consistent with prescribed medication Bottle Appearance: Standard pharmacy container. Clearly labeled. Filled Date: 61 / 17 / 2024 Last Medication intake:  TodaySafety precautions to be maintained throughout the outpatient stay will include: orient to surroundings, keep bed in low position, maintain call bell within reach at all times, provide assistance with transfer out of bed and ambulation.    UDS:  Summary  Date Value Ref Range Status  11/20/2021 Note  Final    Comment:    ==================================================================== Tox[employer] Select 13 (MW) ==================================================================== Test  Result       Flag       Units  Drug Present and Declared for  Prescription Verification   Amphetamine                    >3300        EXPECTED   ng/mg creat    Amphetamine is available as a schedule II prescription drug.    Oxycodone                      >3300        EXPECTED   ng/mg creat   Oxymorphone                    1890         EXPECTED   ng/mg creat   Noroxycodone                   >3300        EXPECTED   ng/mg creat   Noroxymorphone                 386          EXPECTED   ng/mg creat    Sources of oxycodone are scheduled prescription medications.    Oxymorphone, noroxycodone, and noroxymorphone are expected    metabolites of oxycodone. Oxymorphone is also available as a    scheduled prescription medication.  ==================================================================== Test                      Result    Flag   Units      Ref Range   Creatinine              303              mg/dL      >=16 ==================================================================== Declared Medications:  The flagging and interpretation on this report are based on the  following declared medications.  Unexpected results may arise from  inaccuracies in the declared medications.   **Note: The testing scope of this panel includes these medications:   Amphetamine (Adderall)  Oxycodone (Percocet)   **Note: The testing scope of this panel does not include the  following reported medications:   Acetaminophen (Percocet)  Amlodipine (Norvasc)  Duloxetine (Cymbalta)  Lidocaine (Xylocaine)  Methocarbamol (Robaxin)  Nystatin (Mycostatin)  Omeprazole (Prilosec)  Rosuvastatin (Crestor)  Sulfadiazine (Silvadene)  Upadacitinib (Rinvoq) ==================================================================== For clinical consultation, please call [phone]. ====================================================================      ROS  Constitutional: Denies any fever or chills Gastrointestinal: No reported hemesis, hematochezia, vomiting, or acute GI  distress Musculoskeletal:  Low back pain, right foot pain, right ankle pain, bilateral shoulder pain Neurological: No reported episodes of acute onset apraxia, aphasia, dysarthria, agnosia, amnesia, paralysis, loss of coordination, or loss of consciousness  Medication Review  DULoxetine, amLODipine, amphetamine-dextroamphetamine, clonazePAM, diclofenac, gabapentin, lidocaine, methocarbamol, nystatin, omeprazole, oxyCODONE-acetaminophen, pravastatin, silver sulfADIAZINE, topiramate, and traZODone  History Review  Allergy: [name] is allergic to wellbutrin [bupropion], crestor [rosuvastatin], and lyrica [pregabalin]. Drug: [name]  reports no history of drug use. Alcohol:  reports current alcohol use. Tobacco:  reports that she quit smoking about 22 years ago. Her smoking use included cigarettes. She started smoking about 32 years ago. She has a 20 pack-year smoking history. She has never used smokeless tobacco. Social: [name]  reports that she quit smoking about 22 years ago. Her smoking use included cigarettes. She  started smoking about 32 years ago. She has a 20 pack-year smoking history. She has never used smokeless tobacco. She reports current alcohol use. She reports that she does not use drugs. Medical:  has a past medical history of ADHD (attention deficit hyperactivity disorder), Ankylosing spondylitis (HCC), Arthritis, Back pain, Broken femur (HCC) (08/27/2012), Chronic pain, GERD (gastroesophageal reflux disease), Heart murmur, History of methicillin resistant staphylococcus aureus (MRSA) (2009), Hypertension, Migraine, Osteoarthritis, PVC (premature ventricular contraction), Raynaud disease, and Raynaud phenomenon (04/06/2018). Surgical: [name]  has a past surgical history that includes Total knee arthroplasty (Bilateral); Heel spur surgery; Back surgery; IM nailing femoral shaft fracture (Left, 08/27/2012); Femur IM nail (Left, 08/27/2012); Cervical biopsy w/ loop  electrode excision (1990's); Endometrial biopsy (04-05-05 and 02-12-06); Tubal ligation; Shoulder surgery (Left); Vaginal hysterectomy (2018); Foot arthrodesis (Right, 02/01/2020); Achilles tendon lengthening (Right, 02/01/2020); Knee surgery; Bunionectomy; Spinal cord stimulator insertion (N/A, 02/26/2021); Hammer toe surgery (Right); Osteotomy; Bladder surgery; and Arthrodesis (Right). Family: family history includes Bipolar disorder in her mother and sister; Breast cancer in her mother; Cancer in her father, mother, and another family member; Heart disease in her father; Heart failure in her father and another family member.  Laboratory Chemistry Profile   Renal Lab Results  Component Value Date   BUN 19 09/27/2022   CREATININE 0.74 09/27/2022   BCR 26 (H) 09/27/2022   GFRAA 115 08/30/2019   GFRNONAA >60 02/26/2021    Hepatic Lab Results  Component Value Date   AST 21 09/27/2022   ALT 13 09/27/2022   ALBUMIN 4.2 09/27/2022   ALKPHOS 143 (H) 09/27/2022   LIPASE 25 01/12/2016    Electrolytes Lab Results  Component Value Date   NA 143 09/27/2022   K 4.7 09/27/2022   CL 104 09/27/2022   CALCIUM 9.5 09/27/2022    Bone Lab Results  Component Value Date   VD25OH 31 01/07/2013    Inflammation (CRP: Acute Phase) (ESR: Chronic Phase) Lab Results  Component Value Date   CRP 1 09/27/2022   ESRSEDRATE 4 04/06/2018         Note: Above Lab results reviewed.  Recent Imaging Review  CT CARDIAC SCORING (SELF PAY ONLY) Addendum: ADDENDUM REPORT: 09/19/2022 22:27   EXAM:  OVER-READ INTERPRETATION  CT CHEST   The following report is an over-read performed by radiologist Dr.  Alcide Clever of [facility] on 09/19/2022. This over-read  does not include interpretation of cardiac or coronary anatomy or  pathology. The coronary calcium score interpretation by the  cardiologist is attached.   COMPARISON:  None.   FINDINGS:  Cardiovascular: There are no significant  extracardiac vascular  findings.   Mediastinum/Nodes: There are no enlarged lymph nodes within the  visualized mediastinum.   Lungs/Pleura: There is no pleural effusion. The visualized lungs  appear clear.   Upper abdomen: No significant findings in the visualized upper  abdomen.   Musculoskeletal/Chest wall: No chest wall mass or suspicious osseous  findings within the visualized chest.   IMPRESSION:  No significant extracardiac findings within the visualized chest.   Electronically Signed    By: Alcide Clever M.D.    On: 09/19/2022 22:27 Narrative: CLINICAL DATA:  Cardiovascular Disease Risk stratification  EXAM: Coronary Calcium Score  TECHNIQUE: A gated, non-contrast computed tomography scan of the heart was performed using 3mm slice thickness. Axial images were analyzed on a dedicated workstation. Calcium scoring of the coronary arteries was performed using the Agatston method.  FINDINGS: Coronary arteries: Normal origins.  Coronary  Calcium Score:  Left main: 0  Left anterior descending artery: 0  Left circumflex artery: 0  Right coronary artery: 0  Total: 0  Percentile: 0  Pericardium: Normal.  Ascending Aorta: Normal caliber.  Non-cardiac: See separate report from [facility].  IMPRESSION: Coronary calcium score of 0. This was 0 percentile for age-, race-, and sex-matched controls.  RECOMMENDATIONS: Coronary artery calcium (CAC) score is a strong predictor of incident coronary heart disease (CHD) and provides predictive information beyond traditional risk factors. CAC scoring is reasonable to use in the decision to withhold, postpone, or initiate statin therapy in intermediate-risk or selected borderline-risk asymptomatic adults (age 3-75 years and LDL-C >=70 to <190 mg/dL) who do not have diabetes or established atherosclerotic cardiovascular disease (ASCVD).* In intermediate-risk (10-year ASCVD risk >=7.5% to <20%) adults or  selected borderline-risk (10-year ASCVD risk >=5% to <7.5%) adults in whom a CAC score is measured for the purpose of making a treatment decision the following recommendations have been made:  If CAC=0, it is reasonable to withhold statin therapy and reassess in 5 to 10 years, as long as higher risk conditions are absent (diabetes mellitus, family history of premature CHD in first degree relatives (males <55 years; females <65 years), cigarette smoking, or LDL >=190 mg/dL).  If CAC is 1 to 99, it is reasonable to initiate statin therapy for patients >=82 years of age.  If CAC is >=100 or >=75th percentile, it is reasonable to initiate statin therapy at any age.  Cardiology referral should be considered for patients with CAC scores >=400 or >=75th percentile.  *2018 AHA/ACC/AACVPR/AAPA/ABC/ACPM/ADA/AGS/APhA/ASPC/NLA/PCNA Guideline on the Management of Blood Cholesterol: A Report of the American College of Cardiology/American Heart Association Task Force on Clinical Practice Guidelines. J Am Coll Cardiol. 2019;73(24):3168-3209.  Armanda Magic, MD  Electronically Signed: By: Armanda Magic M.D. On: 09/13/2022 16:00 Note: Reviewed        Physical Exam  General appearance: Well nourished, well developed, and well hydrated. In no apparent acute distress Mental status: Alert, oriented x 3 (person, place, & time)       Respiratory: No evidence of acute respiratory distress Eyes: PERLA Vitals: BP 130/87   Pulse 80   Temp (!) 97.3 F (36.3 C)   Ht 5\' 8"  (1.727 m)   Wt 250 lb (113.4 kg)   LMP 03/19/2003   SpO2 100%   BMI 38.01 kg/m  BMI: Estimated body mass index is 38.01 kg/m as calculated from the following:   Height as of this encounter: 5\' 8"  (1.727 m).   Weight as of this encounter: 250 lb (113.4 kg). Ideal: Ideal body weight: 63.9 kg (140 lb 14 oz) Adjusted ideal body weight: 83.7 kg (184 lb 8.4 oz)   Thoracic Spine Area Exam  Skin & Axial Inspection: Well healed  scar from previous spine surgery detected Alignment: Symmetrical Functional ROM: Unrestricted ROM Stability: No instability detected Muscle Tone/Strength: Functionally intact. No obvious neuro-muscular anomalies detected. Sensory (Neurological): Unimpaired Muscle strength & Tone: No palpable anomalies  Lumbar Spine Area Exam  Skin & Axial Inspection: Well healed scar from previous spine surgery detected Alignment: Symmetrical Functional ROM: Unrestricted ROM       Stability: No instability detected Muscle Tone/Strength: Functionally intact. No obvious neuro-muscular anomalies detected. Sensory (Neurological): Unimpaired  Gait & Posture Assessment  Ambulation: Unassisted Gait: Relatively normal for age and body habitus Posture: WNL  Lower Extremity Exam    Side: Right lower extremity  Side: Left lower extremity  Stability: No instability observed  Stability: No instability observed          Skin & Extremity Inspection: Skin color, temperature, and hair growth are WNL. No peripheral edema or cyanosis. No masses, redness, swelling, asymmetry, or associated skin lesions. No contractures.  Skin & Extremity Inspection: Skin color, temperature, and hair growth are WNL. No peripheral edema or cyanosis. No masses, redness, swelling, asymmetry, or associated skin lesions. No contractures.  Functional ROM: Pain restricted ROM  right ankle and foot          Functional ROM: Unrestricted ROM                  Muscle Tone/Strength: Functionally intact. No obvious neuro-muscular anomalies detected.  Muscle Tone/Strength: Functionally intact. No obvious neuro-muscular anomalies detected.  Sensory (Neurological): Neurogenic pain pattern        Sensory (Neurological): Unimpaired        DTR: Patellar: deferred today Achilles: deferred today Plantar: deferred today  DTR: Patellar: deferred today Achilles: deferred today Plantar: deferred today  Palpation: No palpable anomalies  Palpation: No  palpable anomalies    Assessment   Diagnosis  1. S/P lumbar microdiscectomy (left L4/5 with micordissection)   2. Chronic pain syndrome   3. Spinal stenosis, lumbar region, with neurogenic claudication   4. Failed back surgical syndrome   5. Chronic radicular lumbar pain   6. Lumbar radiculopathy           Plan of Care    [name] has a current medication list which includes the following long-term medication(s): amlodipine, amphetamine-dextroamphetamine, amphetamine-dextroamphetamine, amphetamine-dextroamphetamine, clonazepam, duloxetine, gabapentin, omeprazole, pravastatin, topiramate, and trazodone.  Pharmacotherapy (Medications Ordered): Meds ordered this encounter  Medications   oxyCODONE-acetaminophen (PERCOCET) 10-325 MG tablet    Sig: Take 1-2 tablets by mouth 2 (two) times daily as needed for pain (moderate pain).    Dispense:  120 tablet    Refill:  0   oxyCODONE-acetaminophen (PERCOCET) 10-325 MG tablet    Sig: Take 1-2 tablets by mouth 2 (two) times daily as needed for pain (moderate pain).    Dispense:  120 tablet    Refill:  0   oxyCODONE-acetaminophen (PERCOCET) 10-325 MG tablet    Sig: Take 1-2 tablets by mouth 2 (two) times daily as needed for pain (moderate pain).    Dispense:  120 tablet    Refill:  0  Continue with diclofenac as needed, Cymbalta, gabapentin, Robaxin as needed as prescribed  Orders:  Orders Placed This Encounter  Procedures   ToxASSURE Select 13 (MW), Urine    Volume: 30 ml(s). Minimum 3 ml of urine is needed. Document temperature of fresh sample. Indications: Long term (current) use of opiate analgesic (Z36.644)    Order Specific Question:   Release to patient    Answer:   Immediate   Follow-up plan:   Return in about 4 months (around 05/01/2023) for MM, F2F.    Recent Visits No visits were found meeting these conditions. Showing recent visits within past 90 days and meeting all other requirements Today's  Visits Date Type Provider Dept  01/07/23 Office Visit Edward Jolly, MD [facility]  Showing today's visits and meeting all other requirements Future Appointments No visits were found meeting these conditions. Showing future appointments within next 90 days and meeting all other requirements  I discussed the assessment and treatment plan with the patient. The patient was provided an opportunity to ask questions and all were answered. The patient agreed with the plan and demonstrated an  understanding of the instructions.  Patient advised to call back or seek an in-person evaluation if the symptoms or condition worsens.  Duration of encounter: .  Note by: Edward Jolly, MD Date: 01/07/2023; Time: 3:16 PM

## 2023-01-08 ENCOUNTER — Ambulatory Visit: Payer: PPO | Admitting: Family Medicine

## 2023-01-08 DIAGNOSIS — I10 Essential (primary) hypertension: Secondary | ICD-10-CM

## 2023-01-08 DIAGNOSIS — E785 Hyperlipidemia, unspecified: Secondary | ICD-10-CM

## 2023-01-08 DIAGNOSIS — G43809 Other migraine, not intractable, without status migrainosus: Secondary | ICD-10-CM

## 2023-01-08 DIAGNOSIS — F901 Attention-deficit hyperactivity disorder, predominantly hyperactive type: Secondary | ICD-10-CM

## 2023-01-08 MED ORDER — OMEPRAZOLE 40 MG PO CPDR: 40.0000 mg | DELAYED_RELEASE_CAPSULE | Freq: Every day | ORAL | 1 refills | Status: DC

## 2023-01-08 MED ORDER — TOPIRAMATE 50 MG PO TABS: 50.0000 mg | ORAL_TABLET | Freq: Two times a day (BID) | ORAL | 5 refills | Status: DC

## 2023-01-08 MED ORDER — AMPHETAMINE-DEXTROAMPHET ER 30 MG PO CP24: ORAL_CAPSULE | ORAL | 0 refills | Status: DC

## 2023-01-08 MED ORDER — DICLOFENAC SODIUM 75 MG PO TBEC: 75.0000 mg | DELAYED_RELEASE_TABLET | Freq: Two times a day (BID) | ORAL | 5 refills | Status: DC

## 2023-01-08 MED ORDER — AMPHETAMINE-DEXTROAMPHET ER 30 MG PO CP24
ORAL_CAPSULE | ORAL | 0 refills | Status: DC
Start: 2023-01-08 — End: 2023-04-11

## 2023-01-08 MED ORDER — PRAVASTATIN SODIUM 20 MG PO TABS: ORAL_TABLET | ORAL | 1 refills | Status: DC

## 2023-01-08 MED ORDER — AMLODIPINE BESYLATE 2.5 MG PO TABS
2.5000 mg | ORAL_TABLET | Freq: Every day | ORAL | 1 refills | Status: DC
Start: 2023-01-08 — End: 2023-07-09

## 2023-01-08 NOTE — Progress Notes (Signed)
Subjective:    Patient ID: Lauren Greene, female    DOB: 06/08/1965, 57 y.o.   MRN: [id number]  Discussed the use of AI scribe software for clinical note transcription with the patient, who gave verbal consent to proceed.  History of Present Illness   The patient, with a history of arthritis and multiple skin cancers, presents with worsening health conditions. She describes her health as "a mess," with arthritis and shoulder pain being the most prominent issues. The patient reports that her arthritis has worsened, causing significant discomfort and limiting her mobility. She also mentions a recent appointment at [facility] to address a foot condition, which has been causing her to walk abnormally and exacerbating her back pain.  The patient is under the care of a pain management specialist, Dr. Lourdes Sledge, and reports compliance with her prescribed regimen. She is on a daily dose of Percocet, which she describes as a lifeline, but expresses concern about the medication's limitations in managing her pain. She also mentions taking ADD medication daily, which does not cause her to feel wired or keep her awake.  The patient has a history of skin cancer, with recent surgeries for melanoma and squamous cell carcinoma. She describes the surgeries as traumatic, particularly the one on her face, which left a significant scar. She is scheduled for another surgery to remove a squamous cell carcinoma.  The patient also mentions taking a probiotic daily for bowel health and a Docusate as needed. She reports no issues with her bowel movements. She also takes diclofenac for pain management, which she describes as crucial for her comfort. She expresses frustration with recent delays in refilling this prescription.  The patient is also on other medications, including an acid blocker for her stomach, methotrexate, and a cholesterol medicine, pravastatin, which she takes three times a week. She also takes topiramate (Topamax)  and trazodone. She expresses a desire to increase her trazodone dosage, but understands the potential interactions with her other medications.  The patient's mobility issues have led her to consider moving her mailbox closer to her house for convenience. She also mentions taking care of her grandchildren, which can be challenging due to her health conditions. Despite her health struggles, the patient expresses a desire to continue managing her conditions as best as she can.       This patient has adult ADD. Takes medication responsibly. Medication does help the patient focus in be more functional. Patient relates that they are or not abusing the medication or misusing the medication. The patient understands that if they're having any negative side effects such as elevated high blood pressure severe headaches they would need stop the medication follow-up immediately. They also understand that the prescriptions are to last for 3 months then the patient will need to follow-up before having further prescriptions.  Patient compliance good compliance  Does medication help patient function /attention better does help her pay more attention to when she is working on her crafts and other activities also helps with her fatigue  Side effects denies side effects   Review of Systems     Objective:    Physical Exam   CARDIOVASCULAR: Heart murmur present. No edema. SKIN: Surgical scars from eye to cheek.           Assessment & Plan:  Assessment and Plan    Arthritis Worsening pain, particularly in the shoulders and back. Currently managed with diclofenac 75mg  twice daily and Percocet up to 4 times daily. Patient reports significant  discomfort when diclofenac was unavailable for 8 days. -Continue current regimen of diclofenac and Percocet. -Ensure patient has consistent access to diclofenac to avoid gaps in treatment.  Skin Cancer Recent surgeries for melanoma and squamous cell carcinoma. Patient  reports significant discomfort and dissatisfaction with cosmetic results. Another squamous cell carcinoma identified, surgery scheduled for November 5th. -Continue follow-up with dermatology and skin surgery center.  Foot Pain Patient reports ongoing foot pain, possibly related to an unidentified mass. Appointment at [facility] in November to address this issue. -Continue current pain management regimen and follow-up after [facility] appointment for further management.  Medication Management Patient reports occasional difficulty with medication supply and has lost medication on two occasions. Currently taking ADD medication, topiramate, trazodone, methotrexate, and pravastatin (Monday, Wednesday, Friday). -Resend prescription for pravastatin. -Recommend patient consider strategies for organizing and keeping track of medications to avoid loss and ensure consistent use.  Follow-up in 3 months or sooner if needed.     The patient was seen today as part of the visit regarding ADD.  Patient is stable on current regimen.  Appropriate prescriptions prescribed.  Medications were reviewed with the patient as well as compliance. Side effects were checked for. Discussion regarding effectiveness was held. Prescriptions were electronically sent in.  Patient reminded to follow-up in approximately 3 months.   Plans to [facility] law with drug registry was checked and verified while present with the patient. With the arthritis she can continue Voltaren but we did warn her about the possibility of ulcers Duloxetine is helping with her moods and with her pain continue it as 60 mg Patient does take her Ambien on a regular basis for blood pressure under good control Patient also takes trazodone regular basis for her sleep she requested a higher dose she is at the maximum dose for the rest of her medicines No lab work on this visit will do extensive lab work in the spring

## 2023-01-10 LAB — TOXASSURE SELECT 13 (MW), URINE

## 2023-01-13 ENCOUNTER — Ambulatory Visit (HOSPITAL_BASED_OUTPATIENT_CLINIC_OR_DEPARTMENT_OTHER): Payer: PPO | Admitting: Obstetrics & Gynecology

## 2023-02-04 DIAGNOSIS — L578 Other skin changes due to chronic exposure to nonionizing radiation: Secondary | ICD-10-CM | POA: Diagnosis not present

## 2023-02-04 DIAGNOSIS — L570 Actinic keratosis: Secondary | ICD-10-CM | POA: Diagnosis not present

## 2023-02-04 DIAGNOSIS — D0439 Carcinoma in situ of skin of other parts of face: Secondary | ICD-10-CM | POA: Diagnosis not present

## 2023-02-06 DIAGNOSIS — Z79899 Other long term (current) drug therapy: Secondary | ICD-10-CM | POA: Diagnosis not present

## 2023-02-06 DIAGNOSIS — M154 Erosive (osteo)arthritis: Secondary | ICD-10-CM | POA: Diagnosis not present

## 2023-02-06 DIAGNOSIS — M8588 Other specified disorders of bone density and structure, other site: Secondary | ICD-10-CM | POA: Diagnosis not present

## 2023-02-06 DIAGNOSIS — M79642 Pain in left hand: Secondary | ICD-10-CM | POA: Diagnosis not present

## 2023-02-06 DIAGNOSIS — M51369 Other intervertebral disc degeneration, lumbar region without mention of lumbar back pain or lower extremity pain: Secondary | ICD-10-CM | POA: Diagnosis not present

## 2023-02-06 DIAGNOSIS — M79641 Pain in right hand: Secondary | ICD-10-CM | POA: Diagnosis not present

## 2023-02-06 DIAGNOSIS — M1909 Primary osteoarthritis, other specified site: Secondary | ICD-10-CM | POA: Diagnosis not present

## 2023-02-06 DIAGNOSIS — M1990 Unspecified osteoarthritis, unspecified site: Secondary | ICD-10-CM | POA: Diagnosis not present

## 2023-02-25 ENCOUNTER — Other Ambulatory Visit (HOSPITAL_COMMUNITY): Payer: Self-pay | Admitting: Family Medicine

## 2023-02-25 DIAGNOSIS — Z1231 Encounter for screening mammogram for malignant neoplasm of breast: Secondary | ICD-10-CM

## 2023-02-27 ENCOUNTER — Inpatient Hospital Stay (HOSPITAL_COMMUNITY): Admission: RE | Admit: 2023-02-27 | Payer: PPO | Source: Ambulatory Visit

## 2023-02-27 DIAGNOSIS — Z1231 Encounter for screening mammogram for malignant neoplasm of breast: Secondary | ICD-10-CM

## 2023-03-11 ENCOUNTER — Telehealth: Payer: Self-pay | Admitting: Family Medicine

## 2023-03-11 NOTE — Telephone Encounter (Signed)
Patient drop off form to be completed in your yellow folder.

## 2023-03-13 ENCOUNTER — Telehealth: Payer: Self-pay

## 2023-03-13 ENCOUNTER — Encounter: Payer: Self-pay | Admitting: Family Medicine

## 2023-03-13 NOTE — Telephone Encounter (Signed)
Dr Lorin Picket pt called and she needs a letter made out for the Post Master to move her mail box closer to her house where she is disabled it is had for her to get her mail. Post Office said that she would need to get a letter from her Doctor stating that she will need these accomodation's  .

## 2023-03-14 ENCOUNTER — Encounter: Payer: Self-pay | Admitting: Family Medicine

## 2023-03-14 NOTE — Progress Notes (Signed)
Letter dictated please see telephone note from 03/14/2023

## 2023-03-14 NOTE — Telephone Encounter (Signed)
A letter was dictated Please give patient a copy that she can give it to her Postal Service

## 2023-03-17 NOTE — Telephone Encounter (Signed)
The best I can tell I have filled out all forms that have came my way thank you

## 2023-03-20 ENCOUNTER — Other Ambulatory Visit: Payer: Self-pay | Admitting: Family Medicine

## 2023-03-23 ENCOUNTER — Other Ambulatory Visit: Payer: Self-pay | Admitting: Family Medicine

## 2023-03-31 NOTE — Telephone Encounter (Signed)
 Nurses-Cymbalta and trazodone has not increased risk of serotonin syndrome. Current literature indicates patient should be on 1 or the other but not both

## 2023-04-01 ENCOUNTER — Other Ambulatory Visit: Payer: Self-pay | Admitting: Family Medicine

## 2023-04-01 NOTE — Progress Notes (Signed)
 Trazodone stopped because of interaction with Cymbalta-patient aware she chose to stop trazodone and continue Cymbalta

## 2023-04-08 DIAGNOSIS — Z86007 Personal history of in-situ neoplasm of skin: Secondary | ICD-10-CM | POA: Diagnosis not present

## 2023-04-08 DIAGNOSIS — L905 Scar conditions and fibrosis of skin: Secondary | ICD-10-CM | POA: Diagnosis not present

## 2023-04-08 DIAGNOSIS — Z08 Encounter for follow-up examination after completed treatment for malignant neoplasm: Secondary | ICD-10-CM | POA: Diagnosis not present

## 2023-04-10 ENCOUNTER — Telehealth: Payer: Self-pay | Admitting: Family Medicine

## 2023-04-10 NOTE — Telephone Encounter (Signed)
/  Patient came by to pick up correction on her letter from 03/14/2023. Letter in red folder tobe corrected in system.

## 2023-04-11 ENCOUNTER — Ambulatory Visit (INDEPENDENT_AMBULATORY_CARE_PROVIDER_SITE_OTHER): Payer: PPO | Admitting: Family Medicine

## 2023-04-11 VITALS — BP 130/85 | HR 66 | Temp 97.2°F | Ht 68.0 in | Wt 252.0 lb

## 2023-04-11 DIAGNOSIS — M79671 Pain in right foot: Secondary | ICD-10-CM

## 2023-04-11 DIAGNOSIS — F901 Attention-deficit hyperactivity disorder, predominantly hyperactive type: Secondary | ICD-10-CM | POA: Diagnosis not present

## 2023-04-11 DIAGNOSIS — M79672 Pain in left foot: Secondary | ICD-10-CM | POA: Diagnosis not present

## 2023-04-11 MED ORDER — AMPHETAMINE-DEXTROAMPHET ER 30 MG PO CP24: ORAL_CAPSULE | ORAL | 0 refills | Status: DC

## 2023-04-11 MED ORDER — DICLOFENAC SODIUM 75 MG PO TBEC: 75.0000 mg | DELAYED_RELEASE_TABLET | Freq: Two times a day (BID) | ORAL | 5 refills | Status: DC

## 2023-04-12 NOTE — Progress Notes (Signed)
Subjective:    Patient ID: Lauren Greene, female    DOB: [birth year], 58 y.o.   MRN: [id number]  Discussed the use of AI scribe software for clinical note transcription with the patient, who gave verbal consent to proceed.  History of Present Illness   The patient presents with a chief complaint of worsening foot pain, which she describes as constant and severe. She reports that the pain has been progressively worsening and is now significantly impacting her daily activities and quality of life. The patient has a history of a bunion surgery that was reportedly botched, leading to a second surgery that further exacerbated the issue. She now describes her foot as being three inches shorter, causing frequent falls and making it difficult to find comfortable footwear.  The patient also reports a diagnosis of an unspecified rheumatological condition, for which she is currently on methotrexate. She reports that the medication is causing hair loss and does not seem to be improving her symptoms. In fact, she notes that her morning pain has worsened since starting the medication.  In addition to the foot pain and rheumatological issues, the patient also reports a diagnosis of ADHD for which she is prescribed Adderall. She mentions that the manufacturer is currently out of Adderall, and she is considering switching to a different medication.  The patient expresses frustration with her current state of health, noting that she used to be very active and now spends a lot of time sitting due to her foot pain. She expresses a desire to return to her previous level of activity and is seeking consultation at [facility] for potential foot reconstruction. She expresses a preference for honesty from her healthcare providers regarding the potential outcomes of any proposed treatments.         Review of Systems     Objective:    Physical Exam   CHEST: Clear to auscultation bilaterally.     General-in no acute  distress Eyes-no discharge Lungs-respiratory rate normal, CTA CV-no murmurs,RRR Extremities skin warm dry no edema Neuro grossly normal Behavior normal, alert Patient has significant orthopedic deformities in the hands wrist feet      Assessment & Plan:  Assessment and Plan    Foot Deformity Chronic pain and functional impairment. Previous unsuccessful surgeries. Referral to Dr. Harmon Dun, a specialist in foot deformities, expired in September. -Renew referral to Dr. Harmon Dun at [facility] for consultation.  Rheumatoid Arthritis Unclear diagnosis despite extensive workup. Currently on Methotrexate with reported hair loss side effect. -Continue Methotrexate. -Monitor for side effects and efficacy.  ADD Current medication Adderall 30mg  with reported manufacturing issues. -Continue Adderall 30mg . -Consider alternative ADD medication if manufacturing issues persist.  Left-sided Chest Pain New onset, constant aching pain, worse with deep breaths and movement. No associated abdominal symptoms or fever. History of small abdominal aortic aneurysm. -Consider trial of Voltaren twice daily for 10-15 days for potential costochondritis, while holding Meloxicam to avoid kidney stress. -Continue monitoring abdominal aortic aneurysm with ultrasound in 2026.  General Health Maintenance -Continue Protonix as prescribed. -Encourage physical activity as tolerated for weight management.      Foot deformity-she will see the specialist at [facility] for further opinion Patient is disabled Patient suffers with chronic pain sees a specialist for that Has adult ADD tolerates medicine well was having difficult time getting the medicine but she will get it filled soon She will let us know if we need to send it somewhere else or send in a different medicine She  will continue to try and stay as active as possible but it is very difficult for her to do so Patient suffers with mental stress regarding all of  these issues She will watch portion control. No additional labs on our part currently follow-up within 3 months

## 2023-04-14 NOTE — Telephone Encounter (Signed)
Letter was corrected and given to the patient thank you

## 2023-04-25 ENCOUNTER — Other Ambulatory Visit: Payer: Self-pay | Admitting: Family Medicine

## 2023-04-29 ENCOUNTER — Ambulatory Visit
Payer: PPO | Attending: Student in an Organized Health Care Education/Training Program | Admitting: Student in an Organized Health Care Education/Training Program

## 2023-04-29 ENCOUNTER — Encounter: Payer: Self-pay | Admitting: Student in an Organized Health Care Education/Training Program

## 2023-04-29 VITALS — BP 145/92 | HR 74 | Temp 98.2°F | Resp 16 | Ht 68.0 in | Wt 250.0 lb

## 2023-04-29 DIAGNOSIS — M961 Postlaminectomy syndrome, not elsewhere classified: Secondary | ICD-10-CM | POA: Diagnosis not present

## 2023-04-29 DIAGNOSIS — Z9889 Other specified postprocedural states: Secondary | ICD-10-CM | POA: Insufficient documentation

## 2023-04-29 DIAGNOSIS — G894 Chronic pain syndrome: Secondary | ICD-10-CM | POA: Insufficient documentation

## 2023-04-29 DIAGNOSIS — M4726 Other spondylosis with radiculopathy, lumbar region: Secondary | ICD-10-CM

## 2023-04-29 DIAGNOSIS — M47816 Spondylosis without myelopathy or radiculopathy, lumbar region: Secondary | ICD-10-CM | POA: Insufficient documentation

## 2023-04-29 DIAGNOSIS — M48062 Spinal stenosis, lumbar region with neurogenic claudication: Secondary | ICD-10-CM | POA: Diagnosis not present

## 2023-04-29 DIAGNOSIS — M5416 Radiculopathy, lumbar region: Secondary | ICD-10-CM | POA: Insufficient documentation

## 2023-04-29 DIAGNOSIS — G8929 Other chronic pain: Secondary | ICD-10-CM | POA: Insufficient documentation

## 2023-04-29 MED ORDER — OXYCODONE-ACETAMINOPHEN 10-325 MG PO TABS: 1.0000 | ORAL_TABLET | Freq: Two times a day (BID) | ORAL | 0 refills | Status: AC | PRN

## 2023-04-29 MED ORDER — OXYCODONE-ACETAMINOPHEN 10-325 MG PO TABS: 1.0000 | ORAL_TABLET | Freq: Two times a day (BID) | ORAL | 0 refills | Status: DC | PRN

## 2023-04-29 NOTE — Patient Instructions (Signed)
initial 6 months after a "Myocardial Infarction" (Heart Attack). Blood thinners: It is imperative that you stop these medications before procedures. Let us know if you if you take  any blood thinner.  Infection: Avoid procedures during or within two weeks of an infection (including chest colds or gastrointestinal problems). Symptoms associated with infections include: Localized redness, fever, chills, night sweats or profuse sweating, burning sensation when voiding, cough, congestion, stuffiness, runny nose, sore throat, diarrhea, nausea, vomiting, cold or Flu symptoms, recent or current infections. It is specially important if the infection is over the area that we intend to treat. Heart and lung problems: Symptoms that may suggest an active cardiopulmonary problem include: cough, chest pain, breathing difficulties or shortness of breath, dizziness, ankle swelling, uncontrolled high or unusually low blood pressure, and/or palpitations. If you are experiencing any of these symptoms, cancel your procedure and contact your primary care physician for an evaluation.  Remember:  Regular Business hours are:  Monday to Thursday 8:00 AM to 4:00 PM  Provider's Schedule: Delano Metz, MD:  Procedure days: Tuesday and Thursday 7:30 AM to 4:00 PM  Edward Jolly, MD:  Procedure days: Monday and Wednesday 7:30 AM to 4:00 PM Last  Updated: 02/25/2023 ______________________________________________________________________

## 2023-04-29 NOTE — Progress Notes (Signed)
Nursing Pain Medication Assessment:  Safety precautions to be maintained throughout the outpatient stay will include: orient to surroundings, keep bed in low position, maintain call bell within reach at all times, provide assistance with transfer out of bed and ambulation.  Medication Inspection Compliance: Pill count conducted under aseptic conditions, in front of the patient. Neither the pills nor the bottle was removed from the patient's sight at any time. Once count was completed pills were immediately returned to the patient in their original bottle.  Medication: Oxycodone/APAP Pill/Patch Count:  50 of 120 pills remain Pill/Patch Appearance: Markings consistent with prescribed medication Bottle Appearance: Standard pharmacy container. Clearly labeled. Filled Date: 01 / 24 / 2024 Last Medication intake:  Today

## 2023-04-29 NOTE — Progress Notes (Signed)
 PROVIDER NOTE: Information contained herein reflects review and annotations entered in association with encounter. Interpretation of such information and data should be left to medically-trained personnel. Information provided to patient can be located elsewhere in the medical record under "Patient Instructions". Document created using STT-dictation technology, any transcriptional errors that may result from process are unintentional.    Patient: Lauren Greene  Service Category: E/M  Provider: Edward Jolly, MD  DOB: [birth year]  DOS: 04/29/2023  Specialty: Interventional Pain Management  MRN: [id number]  Setting: Ambulatory outpatient  PCP: Babs Sciara, MD  Type: Established Patient    Referring Provider: Babs Sciara, MD  Location: Office  Delivery: Face-to-face     HPI  Lauren Greene, a 58 y.o. year old female, is here today because of her Chronic pain syndrome [G89.4]. Ms. Correira primary complain today is Back Pain (Lumbar midline ) Last encounter: My last encounter with her was on 01/07/23  Pertinent problems: Ms. Oehlert has Periprosthetic fracture around internal prosthetic left knee joint; Chronic back pain; Morbid obesity (HCC); Lumbar degenerative disc disease; Chronic, continuous use of opioids; Fibromyalgia; Lumbar spondylosis; Chronic pain syndrome; and Encounter for long-term opiate analgesic use on their pertinent problem list. Pain Assessment: Severity of Chronic pain is reported as a 7 /10. Location: Back Lower, Mid/denies. Onset: More than a month ago. Quality: Discomfort, Aching. Timing: Constant. Modifying factor(s): medications help a little bit.. Vitals:  height is 5\' 8"  (1.727 m) and weight is 250 lb (113.4 kg). Her temporal temperature is 98.2 F (36.8 C). Her blood pressure is 145/92 (abnormal) and her pulse is 74. Her respiration is 16 and oxygen saturation is 100%.   Reason for encounter: medication management.    Patient having increased low back pain  related to lumbar facet arthropathy.  We discussed diagnostic lumbar facet medial branch nerve blocks #2.  She would like to proceed with with those.  Future considerations include lumbar RFA  Pharmacotherapy Assessment  Analgesic:  Percocet 20 mg BID (MME 60)    Monitoring: [place] PMP: PDMP reviewed during this encounter.       Pharmacotherapy: No side-effects or adverse reactions reported. Compliance: No problems identified. Effectiveness: Clinically acceptable.  Vernie Ammons, RN  04/29/2023  3:20 PM  Sign when Signing Visit Nursing Pain Medication Assessment:  Safety precautions to be maintained throughout the outpatient stay will include: orient to surroundings, keep bed in low position, maintain call bell within reach at all times, provide assistance with transfer out of bed and ambulation.  Medication Inspection Compliance: Pill count conducted under aseptic conditions, in front of the patient. Neither the pills nor the bottle was removed from the patient's sight at any time. Once count was completed pills were immediately returned to the patient in their original bottle.  Medication: Oxycodone/APAP Pill/Patch Count:  50 of 120 pills remain Pill/Patch Appearance: Markings consistent with prescribed medication Bottle Appearance: Standard pharmacy container. Clearly labeled. Filled Date: 01 / 24 / 2024 Last Medication intake:  Today     UDS:  Summary  Date Value Ref Range Status  01/07/2023 Note  Final    Comment:    ==================================================================== [facility] (MW) ==================================================================== Test                             Result       Flag       Units  Drug Present and Declared for Prescription Verification  Amphetamine                    >4651        EXPECTED   ng/mg creat    Amphetamine is available as a schedule II prescription drug.    Oxycodone                      4121          EXPECTED   ng/mg creat   Oxymorphone                    988          EXPECTED   ng/mg creat   Noroxycodone                   >4651        EXPECTED   ng/mg creat   Noroxymorphone                 194          EXPECTED   ng/mg creat    Sources of oxycodone are scheduled prescription medications.    Oxymorphone, noroxycodone, and noroxymorphone are expected    metabolites of oxycodone. Oxymorphone is also available as a    scheduled prescription medication.  Drug Absent but Declared for Prescription Verification   Clonazepam                     Not Detected UNEXPECTED ng/mg creat ==================================================================== Test                      Result    Flag   Units      Ref Range   Creatinine              215              mg/dL      >=16 ==================================================================== Declared Medications:  The flagging and interpretation on this report are based on the  following declared medications.  Unexpected results may arise from  inaccuracies in the declared medications.   **Note: The testing scope of this panel includes these medications:   Amphetamine (Adderall)  Clonazepam (Klonopin)  Oxycodone (Percocet)   **Note: The testing scope of this panel does not include the  following reported medications:   Acetaminophen (Percocet)  Amlodipine (Norvasc)  Diclofenac (Voltaren)  Duloxetine (Cymbalta)  Gabapentin (Neurontin)  Lidocaine (Xylocaine)  Methocarbamol (Robaxin)  Nystatin (Mycostatin)  Omeprazole (Prilosec)  Pravastatin (Pravachol)  Sulfadiazine (Silvadene)  Topiramate (Topamax)  Trazodone (Desyrel) ==================================================================== For clinical consultation, please call [phone]. ====================================================================      ROS  Constitutional: Denies any fever or chills Gastrointestinal: No reported hemesis, hematochezia, vomiting, or  acute GI distress Musculoskeletal:  Low back pain, right foot pain, right ankle pain, bilateral shoulder pain Neurological: No reported episodes of acute onset apraxia, aphasia, dysarthria, agnosia, amnesia, paralysis, loss of coordination, or loss of consciousness  Medication Review  DULoxetine, Vitamin D (Ergocalciferol), amLODipine, amphetamine-dextroamphetamine, clonazePAM, diclofenac, gabapentin, lidocaine, methocarbamol, nystatin, omeprazole, oxyCODONE-acetaminophen, pravastatin, silver sulfADIAZINE, and topiramate  History Review  Allergy: Ms. Fern is allergic to wellbutrin [bupropion], crestor [rosuvastatin], and lyrica [pregabalin]. Drug: Ms. Luckow  reports no history of drug use. Alcohol:  reports current alcohol use. Tobacco:  reports that she quit smoking about 22 years ago. Her smoking use included cigarettes. She started smoking about 32 years ago. She has a 20 pack-year smoking  history. She has never used smokeless tobacco. Social: Ms. Bernath  reports that she quit smoking about 22 years ago. Her smoking use included cigarettes. She started smoking about 32 years ago. She has a 20 pack-year smoking history. She has never used smokeless tobacco. She reports current alcohol use. She reports that she does not use drugs. Medical:  has a past medical history of ADHD (attention deficit hyperactivity disorder), Ankylosing spondylitis (HCC), Arthritis, Back pain, Broken femur (HCC) (08/27/2012), Chronic pain, GERD (gastroesophageal reflux disease), Heart murmur, History of methicillin resistant staphylococcus aureus (MRSA) (2009), Hypertension, Migraine, Osteoarthritis, PVC (premature ventricular contraction), Raynaud disease, and Raynaud phenomenon (04/06/2018). Surgical: Ms. Hilmer  has a past surgical history that includes Total knee arthroplasty (Bilateral); Heel spur surgery; Back surgery; IM nailing femoral shaft fracture (Left, 08/27/2012); Femur IM nail (Left, 08/27/2012);  Cervical biopsy w/ loop electrode excision (1990's); Endometrial biopsy (04-05-05 and 02-12-06); Tubal ligation; Shoulder surgery (Left); Vaginal hysterectomy (2018); Foot arthrodesis (Right, 02/01/2020); Achilles tendon lengthening (Right, 02/01/2020); Knee surgery; Bunionectomy; Spinal cord stimulator insertion (N/A, 02/26/2021); Hammer toe surgery (Right); Osteotomy; Bladder surgery; and Arthrodesis (Right). Family: family history includes Bipolar disorder in her mother and sister; Breast cancer in her mother; Cancer in her father, mother, and another family member; Heart disease in her father; Heart failure in her father and another family member.  Laboratory Chemistry Profile   Renal Lab Results  Component Value Date   BUN 19 09/27/2022   CREATININE 0.74 09/27/2022   BCR 26 (H) 09/27/2022   GFRAA 115 08/30/2019   GFRNONAA >60 02/26/2021    Hepatic Lab Results  Component Value Date   AST 21 09/27/2022   ALT 13 09/27/2022   ALBUMIN 4.2 09/27/2022   ALKPHOS 143 (H) 09/27/2022   LIPASE 25 01/12/2016    Electrolytes Lab Results  Component Value Date   NA 143 09/27/2022   K 4.7 09/27/2022   CL 104 09/27/2022   CALCIUM 9.5 09/27/2022    Bone Lab Results  Component Value Date   VD25OH 31 01/07/2013    Inflammation (CRP: Acute Phase) (ESR: Chronic Phase) Lab Results  Component Value Date   CRP 1 09/27/2022   ESRSEDRATE 4 04/06/2018         Note: Above Lab results reviewed.  Recent Imaging Review  CT CARDIAC SCORING (SELF PAY ONLY) Addendum: ADDENDUM REPORT: 09/19/2022 22:27   EXAM:  OVER-READ INTERPRETATION  CT CHEST   The following report is an over-read performed by radiologist Dr.  Alcide Clever of [facility] on 09/19/2022. This over-read  does not include interpretation of cardiac or coronary anatomy or  pathology. The coronary calcium score interpretation by the  cardiologist is attached.   COMPARISON:  None.   FINDINGS:  Cardiovascular: There are  no significant extracardiac vascular  findings.   Mediastinum/Nodes: There are no enlarged lymph nodes within the  visualized mediastinum.   Lungs/Pleura: There is no pleural effusion. The visualized lungs  appear clear.   Upper abdomen: No significant findings in the visualized upper  abdomen.   Musculoskeletal/Chest wall: No chest wall mass or suspicious osseous  findings within the visualized chest.   IMPRESSION:  No significant extracardiac findings within the visualized chest.   Electronically Signed    By: Alcide Clever M.D.    On: 09/19/2022 22:27 Narrative: CLINICAL DATA:  Cardiovascular Disease Risk stratification  EXAM: Coronary Calcium Score  TECHNIQUE: A gated, non-contrast computed tomography scan of the heart was performed using 3mm slice thickness. Axial images  were analyzed on a dedicated workstation. Calcium scoring of the coronary arteries was performed using the Agatston method.  FINDINGS: Coronary arteries: Normal origins.  Coronary Calcium Score:  Left main: 0  Left anterior descending artery: 0  Left circumflex artery: 0  Right coronary artery: 0  Total: 0  Percentile: 0  Pericardium: Normal.  Ascending Aorta: Normal caliber.  Non-cardiac: See separate report from [facility].  IMPRESSION: Coronary calcium score of 0. This was 0 percentile for age-, race-, and sex-matched controls.  RECOMMENDATIONS: Coronary artery calcium (CAC) score is a strong predictor of incident coronary heart disease (CHD) and provides predictive information beyond traditional risk factors. CAC scoring is reasonable to use in the decision to withhold, postpone, or initiate statin therapy in intermediate-risk or selected borderline-risk asymptomatic adults (age 87-75 years and LDL-C >=70 to <190 mg/dL) who do not have diabetes or established atherosclerotic cardiovascular disease (ASCVD).* In intermediate-risk (10-year ASCVD risk >=7.5% to <20%)  adults or selected borderline-risk (10-year ASCVD risk >=5% to <7.5%) adults in whom a CAC score is measured for the purpose of making a treatment decision the following recommendations have been made:  If CAC=0, it is reasonable to withhold statin therapy and reassess in 5 to 10 years, as long as higher risk conditions are absent (diabetes mellitus, family history of premature CHD in first degree relatives (males <55 years; females <65 years), cigarette smoking, or LDL >=190 mg/dL).  If CAC is 1 to 99, it is reasonable to initiate statin therapy for patients >=62 years of age.  If CAC is >=100 or >=75th percentile, it is reasonable to initiate statin therapy at any age.  Cardiology referral should be considered for patients with CAC scores >=400 or >=75th percentile.  *2018 AHA/ACC/AACVPR/AAPA/ABC/ACPM/ADA/AGS/APhA/ASPC/NLA/PCNA Guideline on the Management of Blood Cholesterol: A Report of the American College of Cardiology/American Heart Association Task Force on Clinical Practice Guidelines. J Am Coll Cardiol. 2019;73(24):3168-3209.  Armanda Magic, MD  Electronically Signed: By: Armanda Magic M.D. On: 09/13/2022 16:00 Note: Reviewed        Physical Exam  General appearance: Well nourished, well developed, and well hydrated. In no apparent acute distress Mental status: Alert, oriented x 3 (person, place, & time)       Respiratory: No evidence of acute respiratory distress Eyes: PERLA Vitals: BP (!) 145/92 (BP Location: Right Arm, Patient Position: Sitting, Cuff Size: Large)   Pulse 74   Temp 98.2 F (36.8 C) (Temporal)   Resp 16   Ht 5\' 8"  (1.727 m)   Wt 250 lb (113.4 kg)   LMP 03/19/2003   SpO2 100%   BMI 38.01 kg/m  BMI: Estimated body mass index is 38.01 kg/m as calculated from the following:   Height as of this encounter: 5\' 8"  (1.727 m).   Weight as of this encounter: 250 lb (113.4 kg). Ideal: Ideal body weight: 63.9 kg (140 lb 14 oz) Adjusted ideal body  weight: 83.7 kg (184 lb 8.4 oz)   Thoracic Spine Area Exam  Skin & Axial Inspection: Well healed scar from previous spine surgery detected Alignment: Symmetrical Functional ROM: Unrestricted ROM Stability: No instability detected Muscle Tone/Strength: Functionally intact. No obvious neuro-muscular anomalies detected. Sensory (Neurological): Unimpaired Muscle strength & Tone: No palpable anomalies  Lumbar Spine Area Exam  Skin & Axial Inspection: Well healed scar from previous spine surgery detected Alignment: Symmetrical Functional ROM: Unrestricted ROM       Stability: No instability detected Muscle Tone/Strength: Functionally intact. No obvious neuro-muscular anomalies detected. Sensory (  Neurological): Musculoskeletal pain pattern Pain with facet loading and lumbar extension  Gait & Posture Assessment  Ambulation: Unassisted Gait: Relatively normal for age and body habitus Posture: WNL  Lower Extremity Exam    Side: Right lower extremity  Side: Left lower extremity  Stability: No instability observed          Stability: No instability observed          Skin & Extremity Inspection: Skin color, temperature, and hair growth are WNL. No peripheral edema or cyanosis. No masses, redness, swelling, asymmetry, or associated skin lesions. No contractures.  Skin & Extremity Inspection: Skin color, temperature, and hair growth are WNL. No peripheral edema or cyanosis. No masses, redness, swelling, asymmetry, or associated skin lesions. No contractures.  Functional ROM: Pain restricted ROM  right ankle and foot          Functional ROM: Unrestricted ROM                  Muscle Tone/Strength: Functionally intact. No obvious neuro-muscular anomalies detected.  Muscle Tone/Strength: Functionally intact. No obvious neuro-muscular anomalies detected.  Sensory (Neurological): Neurogenic pain pattern        Sensory (Neurological): Unimpaired        DTR: Patellar: deferred today Achilles: deferred  today Plantar: deferred today  DTR: Patellar: deferred today Achilles: deferred today Plantar: deferred today  Palpation: No palpable anomalies  Palpation: No palpable anomalies    Assessment   Diagnosis  1. Chronic pain syndrome   2. S/P lumbar microdiscectomy (left L4/5 with micordissection)   3. Spinal stenosis, lumbar region, with neurogenic claudication   4. Failed back surgical syndrome   5. Chronic radicular lumbar pain   6. Lumbar radiculopathy   7. Lumbar facet arthropathy        Plan of Care    Ms. EZRAH DEMBECK has a current medication list which includes the following long-term medication(s): amlodipine, amphetamine-dextroamphetamine, amphetamine-dextroamphetamine, amphetamine-dextroamphetamine, clonazepam, duloxetine, omeprazole, pravastatin, topiramate, and gabapentin.  Pharmacotherapy (Medications Ordered): Meds ordered this encounter  Medications   oxyCODONE-acetaminophen (PERCOCET) 10-325 MG tablet    Sig: Take 1-2 tablets by mouth 2 (two) times daily as needed for pain (moderate pain).    Dispense:  120 tablet    Refill:  0   oxyCODONE-acetaminophen (PERCOCET) 10-325 MG tablet    Sig: Take 1-2 tablets by mouth 2 (two) times daily as needed for pain (moderate pain).    Dispense:  120 tablet    Refill:  0   oxyCODONE-acetaminophen (PERCOCET) 10-325 MG tablet    Sig: Take 1-2 tablets by mouth 2 (two) times daily as needed for pain (moderate pain).    Dispense:  120 tablet    Refill:  0  Continue with diclofenac as needed, Cymbalta, gabapentin, Robaxin as needed as prescribed Continue with SCS Lumbar facet nerve blocks #2 for pain related to lumbar facet arthritis Orders:  Orders Placed This Encounter  Procedures   LUMBAR FACET(MEDIAL BRANCH NERVE BLOCK) MBNB    Standing Status:   Future    Expected Date:   05/27/2023    Expiration Date:   07/27/2023    Scheduling Instructions:     Procedure: Lumbar facet block (AKA.: Lumbosacral medial branch  nerve block)     Side: Bilateral     Level:  L3-4, L4-5, Facets ( L3, L4, L5, Medial Branch)     Sedation: Patient's choice.     Timeframe: ASAA    Where will this procedure be performed?:  [facility]   Follow-up plan:   Return in about 4 months (around 09/04/2023) for MM, F2F, add to NP list.    Recent Visits No visits were found meeting these conditions. Showing recent visits within past 90 days and meeting all other requirements Today's Visits Date Type Provider Dept  04/29/23 Office Visit Edward Jolly, MD [facility]  Showing today's visits and meeting all other requirements Future Appointments No visits were found meeting these conditions. Showing future appointments within next 90 days and meeting all other requirements  I discussed the assessment and treatment plan with the patient. The patient was provided an opportunity to ask questions and all were answered. The patient agreed with the plan and demonstrated an understanding of the instructions.  Patient advised to call back or seek an in-person evaluation if the symptoms or condition worsens.  Duration of encounter: .  Note by: Edward Jolly, MD Date: 04/29/2023; Time: 3:25 PM

## 2023-05-02 ENCOUNTER — Encounter: Payer: Self-pay | Admitting: Student in an Organized Health Care Education/Training Program

## 2023-05-02 DIAGNOSIS — M19011 Primary osteoarthritis, right shoulder: Secondary | ICD-10-CM

## 2023-05-12 ENCOUNTER — Ambulatory Visit: Payer: PPO | Admitting: Student in an Organized Health Care Education/Training Program

## 2023-05-15 DIAGNOSIS — Z87891 Personal history of nicotine dependence: Secondary | ICD-10-CM | POA: Diagnosis not present

## 2023-05-15 DIAGNOSIS — Z79891 Long term (current) use of opiate analgesic: Secondary | ICD-10-CM | POA: Diagnosis not present

## 2023-05-15 DIAGNOSIS — M65941 Unspecified synovitis and tenosynovitis, right hand: Secondary | ICD-10-CM | POA: Diagnosis not present

## 2023-05-15 DIAGNOSIS — G8929 Other chronic pain: Secondary | ICD-10-CM | POA: Diagnosis not present

## 2023-05-15 DIAGNOSIS — M154 Erosive (osteo)arthritis: Secondary | ICD-10-CM | POA: Diagnosis not present

## 2023-05-15 DIAGNOSIS — M459 Ankylosing spondylitis of unspecified sites in spine: Secondary | ICD-10-CM | POA: Diagnosis not present

## 2023-05-15 DIAGNOSIS — M064 Inflammatory polyarthropathy: Secondary | ICD-10-CM | POA: Diagnosis not present

## 2023-06-18 ENCOUNTER — Ambulatory Visit: Payer: PPO | Admitting: Student in an Organized Health Care Education/Training Program

## 2023-06-18 ENCOUNTER — Other Ambulatory Visit: Payer: Self-pay | Admitting: Family Medicine

## 2023-06-18 DIAGNOSIS — G43809 Other migraine, not intractable, without status migrainosus: Secondary | ICD-10-CM

## 2023-06-23 ENCOUNTER — Other Ambulatory Visit: Payer: Self-pay | Admitting: Family Medicine

## 2023-06-30 ENCOUNTER — Encounter: Payer: Self-pay | Admitting: Student in an Organized Health Care Education/Training Program

## 2023-06-30 ENCOUNTER — Ambulatory Visit
Attending: Student in an Organized Health Care Education/Training Program | Admitting: Student in an Organized Health Care Education/Training Program

## 2023-06-30 ENCOUNTER — Ambulatory Visit
Admission: RE | Admit: 2023-06-30 | Discharge: 2023-06-30 | Disposition: A | Discharge status: Home / Self Care | Source: Ambulatory Visit | Attending: Student in an Organized Health Care Education/Training Program | Admitting: Student in an Organized Health Care Education/Training Program

## 2023-06-30 VITALS — BP 144/82 | HR 56 | Temp 97.2°F | Resp 16 | Ht 68.0 in | Wt 255.0 lb

## 2023-06-30 DIAGNOSIS — M47816 Spondylosis without myelopathy or radiculopathy, lumbar region: Secondary | ICD-10-CM | POA: Diagnosis not present

## 2023-06-30 DIAGNOSIS — M19012 Primary osteoarthritis, left shoulder: Secondary | ICD-10-CM | POA: Diagnosis not present

## 2023-06-30 DIAGNOSIS — M19011 Primary osteoarthritis, right shoulder: Secondary | ICD-10-CM | POA: Insufficient documentation

## 2023-06-30 DIAGNOSIS — G894 Chronic pain syndrome: Secondary | ICD-10-CM | POA: Diagnosis not present

## 2023-06-30 MED ORDER — LIDOCAINE HCL 2 % IJ SOLN
INTRAMUSCULAR | Status: AC
  Filled 2023-06-30: qty 20

## 2023-06-30 MED ORDER — ROPIVACAINE HCL 2 MG/ML IJ SOLN
INTRAMUSCULAR | Status: AC
  Filled 2023-06-30: qty 20

## 2023-06-30 MED ORDER — METHYLPREDNISOLONE ACETATE 40 MG/ML IJ SUSP
40.0000 mg | Freq: Once | INTRAMUSCULAR | Status: AC
  Administered 2023-06-30: 40 mg via INTRA_ARTICULAR

## 2023-06-30 MED ORDER — IOHEXOL 180 MG/ML  SOLN
INTRAMUSCULAR | Status: AC
  Filled 2023-06-30: qty 20

## 2023-06-30 MED ORDER — ROPIVACAINE HCL 2 MG/ML IJ SOLN
18.0000 mL | Freq: Once | INTRAMUSCULAR | Status: AC
  Administered 2023-06-30: 18 mL via PERINEURAL

## 2023-06-30 MED ORDER — METHYLPREDNISOLONE ACETATE 40 MG/ML IJ SUSP
INTRAMUSCULAR | Status: AC
  Filled 2023-06-30: qty 1

## 2023-06-30 MED ORDER — DEXAMETHASONE SODIUM PHOSPHATE 10 MG/ML IJ SOLN
20.0000 mg | Freq: Once | INTRAMUSCULAR | Status: AC
  Administered 2023-06-30: 20 mg

## 2023-06-30 MED ORDER — IOHEXOL 180 MG/ML  SOLN
10.0000 mL | Freq: Once | INTRAMUSCULAR | Status: AC
  Administered 2023-06-30: 10 mL via INTRATHECAL

## 2023-06-30 MED ORDER — DEXAMETHASONE SODIUM PHOSPHATE 10 MG/ML IJ SOLN
INTRAMUSCULAR | Status: AC
  Filled 2023-06-30: qty 2

## 2023-06-30 MED ORDER — LIDOCAINE HCL 2 % IJ SOLN
20.0000 mL | Freq: Once | INTRAMUSCULAR | Status: AC
  Administered 2023-06-30: 400 mg

## 2023-06-30 NOTE — Progress Notes (Signed)
 PROVIDER NOTE: Interpretation of information contained herein should be left to medically-trained personnel. Specific patient instructions are provided elsewhere under "Patient Instructions" section of medical record. This document was created in part using STT-dictation technology, any transcriptional errors that may result from this process are unintentional.  Patient: Lauren Greene Type: Established DOB: 1965/03/27 MRN: [id number] PCP: Babs Sciara, MD  Service: Procedure DOS: 06/30/2023 Setting: Ambulatory Location: Ambulatory outpatient facility Delivery: Face-to-face Provider: Edward Jolly, MD Specialty: Interventional Pain Management Specialty designation: 09 Location: Outpatient facility Ref. Prov.: Babs Sciara, MD       Interventional Therapy   Type: Lumbar Facet, Medial Branch Block(s) (w/ fluoroscopic mapping)  Laterality: Bilateral  Level: L3, L4, and L5 Medial Branch Level(s). Injecting these levels blocks the L3-4 and L4-5 lumbar facet joints.  Imaging: Fluoroscopic guidance Spinal ([id number]) Anesthesia: Local anesthesia (1-2% Lidocaine) DOS: 06/30/2023 Performed by: Edward Jolly, MD  Primary Purpose: Diagnostic/Therapeutic Indications: Low back pain severe enough to impact quality of life or function. 1. Lumbar facet arthropathy   2. Localized osteoarthritis of shoulder regions, bilateral   3. Chronic pain syndrome    NAS-11 Pain score:   Pre-procedure: 9 /10   Post-procedure: 6 /10     Position / Prep / Materials:  Position: Prone  Prep solution: ChloraPrep (2% chlorhexidine gluconate and 70% isopropyl alcohol) Area Prepped: Posterolateral Lumbosacral Spine (Wide prep: From the lower border of the scapula down to the end of the tailbone and from flank to flank.)  Materials:  Tray: Block Needle(s):  Type: Spinal  Gauge (G): 22  Length: 5-in Qty: 3     H&P (Pre-op Assessment):  Lauren Greene is a 58 y.o. (year old), female patient, seen today for  interventional treatment. She  has a past surgical history that includes Total knee arthroplasty (Bilateral); Heel spur surgery; Back surgery; IM nailing femoral shaft fracture (Left, 08/27/2012); Femur IM nail (Left, 08/27/2012); Cervical biopsy w/ loop electrode excision (1990's); Endometrial biopsy (04-05-05 and 02-12-06); Tubal ligation; Shoulder surgery (Left); Vaginal hysterectomy (2018); Foot arthrodesis (Right, 02/01/2020); Achilles tendon lengthening (Right, 02/01/2020); Knee surgery; Bunionectomy; Spinal cord stimulator insertion (N/A, 02/26/2021); Hammer toe surgery (Right); Osteotomy; Bladder surgery; and Arthrodesis (Right). Lauren Greene has a current medication list which includes the following prescription(s): amlodipine, amphetamine-dextroamphetamine, amphetamine-dextroamphetamine, amphetamine-dextroamphetamine, clonazepam, diclofenac, diclofenac, duloxetine, lidocaine, lidocaine, methocarbamol, nystatin, omeprazole, oxycodone-acetaminophen, [START ON 07/11/2023] oxycodone-acetaminophen, [START ON 08/10/2023] oxycodone-acetaminophen, pravastatin, silver sulfadiazine, topiramate, trazodone, vitamin d (ergocalciferol), enbrel sureclick, and gabapentin. Her primarily concern today is the Back Pain (Lower back and bilateral shoulder left one worse)  Initial Vital Signs:  Pulse/HCG Rate: 63  Temp: (!) 97.2 F (36.2 C) Resp: 16 BP: (!) 142/85 SpO2: 97 %  BMI: Estimated body mass index is 38.77 kg/m as calculated from the following:   Height as of this encounter: 5\' 8"  (1.727 m).   Weight as of this encounter: 255 lb (115.7 kg).  Risk Assessment: Allergies: Reviewed. She is allergic to wellbutrin [bupropion], crestor [rosuvastatin], and lyrica [pregabalin].  Allergy Precautions: None required Coagulopathies: Reviewed. None identified.  Blood-thinner therapy: None at this time Active Infection(s): Reviewed. None identified. Lauren Greene is afebrile  Site Confirmation: Lauren Greene was  asked to confirm the procedure and laterality before marking the site Procedure checklist: Completed Consent: Before the procedure and under the influence of no sedative(s), amnesic(s), or anxiolytics, the patient was informed of the treatment options, risks and possible complications. To fulfill our ethical and legal obligations, as recommended by the American Medical Association's Code  of Ethics, I have informed the patient of my clinical impression; the nature and purpose of the treatment or procedure; the risks, benefits, and possible complications of the intervention; the alternatives, including doing nothing; the risk(s) and benefit(s) of the alternative treatment(s) or procedure(s); and the risk(s) and benefit(s) of doing nothing. The patient was provided information about the general risks and possible complications associated with the procedure. These may include, but are not limited to: failure to achieve desired goals, infection, bleeding, organ or nerve damage, allergic reactions, paralysis, and death. In addition, the patient was informed of those risks and complications associated to Spine-related procedures, such as failure to decrease pain; infection (i.e.: Meningitis, epidural or intraspinal abscess); bleeding (i.e.: epidural hematoma, subarachnoid hemorrhage, or any other type of intraspinal or peri-dural bleeding); organ or nerve damage (i.e.: Any type of peripheral nerve, nerve root, or spinal cord injury) with subsequent damage to sensory, motor, and/or autonomic systems, resulting in permanent pain, numbness, and/or weakness of one or several areas of the body; allergic reactions; (i.e.: anaphylactic reaction); and/or death. Furthermore, the patient was informed of those risks and complications associated with the medications. These include, but are not limited to: allergic reactions (i.e.: anaphylactic or anaphylactoid reaction(s)); adrenal axis suppression; blood sugar elevation that in  diabetics may result in ketoacidosis or comma; water retention that in patients with history of congestive heart failure may result in shortness of breath, pulmonary edema, and decompensation with resultant heart failure; weight gain; swelling or edema; medication-induced neural toxicity; particulate matter embolism and blood vessel occlusion with resultant organ, and/or nervous system infarction; and/or aseptic necrosis of one or more joints. Finally, the patient was informed that Medicine is not an exact science; therefore, there is also the possibility of unforeseen or unpredictable risks and/or possible complications that may result in a catastrophic outcome. The patient indicated having understood very clearly. We have given the patient no guarantees and we have made no promises. Enough time was given to the patient to ask questions, all of which were answered to the patient's satisfaction. Ms. Clinkenbeard has indicated that she wanted to continue with the procedure. Attestation: I, the ordering provider, attest that I have discussed with the patient the benefits, risks, side-effects, alternatives, likelihood of achieving goals, and potential problems during recovery for the procedure that I have provided informed consent. Date  Time: 06/30/2023  1:49 PM  Pre-Procedure Preparation:  Monitoring: As per clinic protocol. Respiration, ETCO2, SpO2, BP, heart rate and rhythm monitor placed and checked for adequate function Safety Precautions: Patient was assessed for positional comfort and pressure points before starting the procedure. Time-out: I initiated and conducted the "Time-out" before starting the procedure, as per protocol. The patient was asked to participate by confirming the accuracy of the "Time Out" information. Verification of the correct person, site, and procedure were performed and confirmed by me, the nursing staff, and the patient. "Time-out" conducted as per Joint Commission's Universal  Protocol (UP.01.01.01). Time: 1515 Start Time: 1515 hrs.  Description of Procedure:          Laterality: (see above) Targeted Levels: (see above)  Safety Precautions: Aspiration looking for blood return was conducted prior to all injections. At no point did we inject any substances, as a needle was being advanced. Before injecting, the patient was told to immediately notify me if she was experiencing any new onset of "ringing in the ears, or metallic taste in the mouth". No attempts were made at seeking any paresthesias. Safe injection practices and  needle disposal techniques used. Medications properly checked for expiration dates. SDV (single dose vial) medications used. After the completion of the procedure, all disposable equipment used was discarded in the proper designated medical waste containers. Local Anesthesia: Protocol guidelines were followed. The patient was positioned over the fluoroscopy table. The area was prepped in the usual manner. The time-out was completed. The target area was identified using fluoroscopy. A 12-in long, straight, sterile hemostat was used with fluoroscopic guidance to locate the targets for each level blocked. Once located, the skin was marked with an approved surgical skin marker. Once all sites were marked, the skin (epidermis, dermis, and hypodermis), as well as deeper tissues (fat, connective tissue and muscle) were infiltrated with a small amount of a short-acting local anesthetic, loaded on a 10cc syringe with a 25G, 1.5-in  Needle. An appropriate amount of time was allowed for local anesthetics to take effect before proceeding to the next step. Local Anesthetic: Lidocaine 2.0% The unused portion of the local anesthetic was discarded in the proper designated containers. Technical description of process:   L3 Medial Branch Nerve Block (MBB): The target area for the L3 medial branch is at the junction of the postero-lateral aspect of the superior articular  process and the superior, posterior, and medial edge of the transverse process of L4. Under fluoroscopic guidance, a Quincke needle was inserted until contact was made with os over the superior postero-lateral aspect of the pedicular shadow (target area). After negative aspiration for blood, 2mL of the nerve block solution was injected without difficulty or complication. The needle was removed intact. L4 Medial Branch Nerve Block (MBB): The target area for the L4 medial branch is at the junction of the postero-lateral aspect of the superior articular process and the superior, posterior, and medial edge of the transverse process of L5. Under fluoroscopic guidance, a Quincke needle was inserted until contact was made with os over the superior postero-lateral aspect of the pedicular shadow (target area). After negative aspiration for blood, 2mL of the nerve block solution was injected without difficulty or complication. The needle was removed intact. L5 Medial Branch Nerve Block (MBB): The target area for the L5 medial branch is at the junction of the postero-lateral aspect of the superior articular process and the superior, posterior, and medial edge of the sacral ala. Under fluoroscopic guidance, a Quincke needle was inserted until contact was made with os over the superior postero-lateral aspect of the pedicular shadow (target area). After negative aspiration for blood, 2 mL of the nerve block solution was injected without difficulty or complication. The needle was removed intact.  12 cc solution made of 10cc of 0.2% ropivacaine, 2 cc of Decadron 10 mg/cc.  2 cc injected at each level above bilaterally.    Once the entire procedure was completed, the treated area was cleaned, making sure to leave some of the prepping solution back to take advantage of its long term bactericidal properties.         Illustration of the posterior view of the lumbar spine and the posterior neural structures. Laminae of L2  through S1 are labeled. DPRL5, dorsal primary ramus of L5; DPRS1, dorsal primary ramus of S1; DPR3, dorsal primary ramus of L3; FJ, facet (zygapophyseal) joint L3-L4; I, inferior articular process of L4; LB1, lateral branch of dorsal primary ramus of L1; IAB, inferior articular branches from L3 medial branch (supplies L4-L5 facet joint); IBP, intermediate branch plexus; MB3, medial branch of dorsal primary ramus of L3; NR3, third lumbar  nerve root; S, superior articular process of L5; SAB, superior articular branches from L4 (supplies L4-5 facet joint also); TP3, transverse process of L3.   Facet Joint Innervation (* possible contribution)  L1-2 T12, L1 (L2*)  Medial Branch  L2-3 L1, L2 (L3*)         "          "  L3-4 L2, L3 (L4*)         "          "  L4-5 L3, L4 (L5*)         "          "  L5-S1 L4, L5, S1          "          "    Vitals:   06/30/23 1352 06/30/23 1515 06/30/23 1525 06/30/23 1535  BP: (!) 142/85 136/79 (!) 143/84 (!) 144/82  Pulse: 63 (!) 56 (!) 55 (!) 56  Resp:  16 16 16   Temp: (!) 97.2 F (36.2 C)     SpO2: 97% 100% 98% 100%  Weight: 255 lb (115.7 kg)     Height: 5\' 8"  (1.727 m)        End Time: 1531 hrs.  Imaging Guidance (Spinal):          Type of Imaging Technique: Fluoroscopy Guidance (Spinal) Indication(s): Fluoroscopy guidance for needle placement to enhance accuracy in procedures requiring precise needle localization for targeted delivery of medication in or near specific anatomical locations not easily accessible without such real-time imaging assistance. Exposure Time: Please see nurses notes. Contrast: None used. Fluoroscopic Guidance: I was personally present during the use of fluoroscopy. "Tunnel Vision Technique" used to obtain the best possible view of the target area. Parallax error corrected before commencing the procedure. "Direction-depth-direction" technique used to introduce the needle under continuous pulsed fluoroscopy. Once target was reached,  antero-posterior, oblique, and lateral fluoroscopic projection used confirm needle placement in all planes. Images permanently stored in EMR. Interpretation: No contrast injected. I personally interpreted the imaging intraoperatively. Adequate needle placement confirmed in multiple planes. Permanent images saved into the patient's record.  Post-operative Assessment:  Post-procedure Vital Signs:  Pulse/HCG Rate: (!) 56  Temp: (!) 97.2 F (36.2 C) Resp: 16 BP: (!) 144/82 SpO2: 100 %  EBL: None  Complications: No immediate post-treatment complications observed by team, or reported by patient.  Note: The patient tolerated the entire procedure well. A repeat set of vitals were taken after the procedure and the patient was kept under observation following institutional policy, for this type of procedure. Post-procedural neurological assessment was performed, showing return to baseline, prior to discharge. The patient was provided with post-procedure discharge instructions, including a section on how to identify potential problems. Should any problems arise concerning this procedure, the patient was given instructions to immediately contact us, at any time, without hesitation. In any case, we plan to contact the patient by telephone for a follow-up status report regarding this interventional procedure.  Comments:  No additional relevant information.  Plan of Care (POC)  Orders:  Patient states that she would like to have her [employer] spinal cord stimulator explanted.  She states that she does not find any relief with it and in fact it is causing her to have increased back pain where the IPG is.  This was implanted by Dr. Adriana Simas.  She would like to stay locally to have it explanted.  I will send referral to Dr. Marcell Barlow.  Orders Placed This Encounter  Procedures   DG PAIN [facility] C-ARM 1-60 MIN NO REPORT    Intraoperative interpretation by procedural physician at [facility].     Standing Status:   Standing    Number of Occurrences:   1    Reason for exam::   Assistance in needle guidance and placement for procedures requiring needle placement in or near specific anatomical locations not easily accessible without such assistance.   Ambulatory referral to Neurosurgery    Referral Priority:   Routine    Referral Type:   Surgical    Referral Reason:   Specialty Services Required    Referred to Provider:   Jodeen Munch, MD    Requested Specialty:   Neurosurgery    Number of Visits Requested:   1   Chronic Opioid Analgesic:   Percocet 20 mg BID (MME 60)    Medications ordered for procedure: Meds ordered this encounter  Medications   iohexol (OMNIPAQUE) 180 MG/ML injection 10 mL    Must be Myelogram-compatible. If not available, you may substitute with a water-soluble, non-ionic, hypoallergenic, myelogram-compatible radiological contrast medium.   lidocaine (XYLOCAINE) 2 % (with pres) injection 400 mg   ropivacaine (PF) 2 mg/mL (0.2%) (NAROPIN) injection 18 mL   dexamethasone (DECADRON) injection 20 mg   methylPREDNISolone acetate (DEPO-MEDROL) injection 40 mg   Medications administered: We administered iohexol, lidocaine, ropivacaine (PF) 2 mg/mL (0.2%), dexamethasone, and methylPREDNISolone acetate.  See the medical record for exact dosing, route, and time of administration.  Follow-up plan:   Return for Keep sch. appt.       Recent Visits Date Type Provider Dept  04/29/23 Office Visit Cephus Collin, MD [facility]  Showing recent visits within past 90 days and meeting all other requirements Today's Visits Date Type Provider Dept  06/30/23 Procedure visit Cephus Collin, MD [facility]  Showing today's visits and meeting all other requirements Future Appointments Date Type Provider Dept  08/26/23 Appointment Cephus Collin, MD [facility]  Showing future appointments within next 90 days and meeting all other  requirements  Disposition: Discharge home  Discharge (Date  Time): 06/30/2023; 1540 hrs.   Primary Care Physician: Bennet Brasil, MD Location: [facility] Note by: Cephus Collin, MD (TTS technology used. I apologize for any typographical errors that were not detected and corrected.) Date: 06/30/2023; Time: 4:25 PM  Disclaimer:  Medicine is not an Visual merchandiser. The only guarantee in medicine is that nothing is guaranteed. It is important to note that the decision to proceed with this intervention was based on the information collected from the patient. The Data and conclusions were drawn from the patient's questionnaire, the interview, and the physical examination. Because the information was provided in large part by the patient, it cannot be guaranteed that it has not been purposely or unconsciously manipulated. Every effort has been made to obtain as much relevant data as possible for this evaluation. It is important to note that the conclusions that lead to this procedure are derived in large part from the available data. Always take into account that the treatment will also be dependent on availability of resources and existing treatment guidelines, considered by other Pain Management Practitioners as being common knowledge and practice, at the time of the intervention. For Medico-Legal purposes, it is also important to point out that variation in procedural techniques and pharmacological choices are the acceptable norm. The indications, contraindications, technique, and results of the above procedure  should only be interpreted and judged by a Board-Certified Interventional Pain Specialist with extensive familiarity and expertise in the same exact procedure and technique.

## 2023-06-30 NOTE — Progress Notes (Signed)
 PROVIDER NOTE: Interpretation of information contained herein should be left to medically-trained personnel. Specific patient instructions are provided elsewhere under "Patient Instructions" section of medical record. This document was created in part using STT-dictation technology, any transcriptional errors that may result from this process are unintentional.  Patient: Lauren Greene Type: Established DOB: 11-17-1965 MRN: [id number] PCP: Bennet Brasil, MD  Service: Procedure DOS: 06/30/2023 Setting: Ambulatory Location: Ambulatory outpatient facility Delivery: Face-to-face Provider: Cephus Collin, MD Specialty: Interventional Pain Management Specialty designation: 09 Location: Outpatient facility Ref. Prov.: Bennet Brasil, MD       Interventional Therapy   Procedure: Suprascapular nerve block (SSNB) #1  Laterality:  Bilateral  Level: Superior to scapular spine, lateral to supraspinatus fossa (Suprascapular notch).  Imaging: Fluoroscopic guidance         Anesthesia: Local anesthesia (1-2% Lidocaine) DOS: 06/30/2023  Performed by: Cephus Collin, MD  Purpose: Diagnostic/Therapeutic Indications: Shoulder pain, severe enough to impact quality of life and/or function.  NAS-11 score:   Pre-procedure: 9 /10   Post-procedure: 6 /10     Target: Suprascapular nerve Location: midway between the medial border of the scapula and the acromion as it runs through the suprascapular notch. Region: Suprascapular, posterior shoulder  Approach: Percutaneous  Neuroanatomy: The suprascapular nerve is the lateral branch of the superior trunk of the brachial plexus. It receives nerve fibers that originate in the nerve roots C5 and C6 (and sometimes C4). It is a mixed nerve, meaning that it provides both sensory and motor supply for the suprascapular region. Function: The main function of this nerve is to provide motor innervation for two muscles, the supraspinatus and infraspinatus muscles. They are  part of the rotator cuff muscles. In addition, the suprascapular nerve provides a sensory supply to the joints of the scapula (glenohumeral and acromioclavicular joints). Rationale (medical necessity): procedure needed and proper for the diagnosis and/or treatment of the patient's medical symptoms and needs.  Position / Prep / Materials:  Position: Prone Materials:  Tray: Block Needle(s):  Type: Spinal  Gauge (G): 22  Length: 3.5 in.  Qty: 1 Prep solution: ChloraPrep (2% chlorhexidine gluconate and 70% isopropyl alcohol) Prep Area: Entire posterior shoulder area. From upper spine to shoulder proper (upper arm), and from lateral neck to lower tip of shoulder blade.   H&P (Pre-op Assessment):  Lauren Greene is a 58 y.o. (year old), female patient, seen today for interventional treatment. She  has a past surgical history that includes Total knee arthroplasty (Bilateral); Heel spur surgery; Back surgery; IM nailing femoral shaft fracture (Left, 08/27/2012); Femur IM nail (Left, 08/27/2012); Cervical biopsy w/ loop electrode excision (1990's); Endometrial biopsy (04-05-05 and 02-12-06); Tubal ligation; Shoulder surgery (Left); Vaginal hysterectomy (2018); Foot arthrodesis (Right, 02/01/2020); Achilles tendon lengthening (Right, 02/01/2020); Knee surgery; Bunionectomy; Spinal cord stimulator insertion (N/A, 02/26/2021); Hammer toe surgery (Right); Osteotomy; Bladder surgery; and Arthrodesis (Right). Lauren Greene has a current medication list which includes the following prescription(s): amlodipine, amphetamine-dextroamphetamine, amphetamine-dextroamphetamine, amphetamine-dextroamphetamine, clonazepam, diclofenac, diclofenac, duloxetine, lidocaine, lidocaine, methocarbamol, nystatin, omeprazole, oxycodone-acetaminophen, [START ON 07/11/2023] oxycodone-acetaminophen, [START ON 08/10/2023] oxycodone-acetaminophen, pravastatin, silver sulfadiazine, topiramate, trazodone, vitamin d (ergocalciferol), enbrel  sureclick, and gabapentin. Her primarily concern today is the Back Pain (Lower back and bilateral shoulder left one worse)  Initial Vital Signs:  Pulse/HCG Rate: 63  Temp: (!) 97.2 F (36.2 C) Resp: 16 BP: (!) 142/85 SpO2: 97 %  BMI: Estimated body mass index is 38.77 kg/m as calculated from the following:   Height as of this encounter: 5'  8" (1.727 m).   Weight as of this encounter: 255 lb (115.7 kg).  Risk Assessment: Allergies: Reviewed. She is allergic to wellbutrin [bupropion], crestor [rosuvastatin], and lyrica [pregabalin].  Allergy Precautions: None required Coagulopathies: Reviewed. None identified.  Blood-thinner therapy: None at this time Active Infection(s): Reviewed. None identified. Lauren Greene is afebrile  Site Confirmation: Lauren Greene was asked to confirm the procedure and laterality before marking the site Procedure checklist: Completed Consent: Before the procedure and under the influence of no sedative(s), amnesic(s), or anxiolytics, the patient was informed of the treatment options, risks and possible complications. To fulfill our ethical and legal obligations, as recommended by the American Medical Association's Code of Ethics, I have informed the patient of my clinical impression; the nature and purpose of the treatment or procedure; the risks, benefits, and possible complications of the intervention; the alternatives, including doing nothing; the risk(s) and benefit(s) of the alternative treatment(s) or procedure(s); and the risk(s) and benefit(s) of doing nothing. The patient was provided information about the general risks and possible complications associated with the procedure. These may include, but are not limited to: failure to achieve desired goals, infection, bleeding, organ or nerve damage, allergic reactions, paralysis, and death. In addition, the patient was informed of those risks and complications associated to the procedure, such as failure to  decrease pain; infection; bleeding; organ or nerve damage with subsequent damage to sensory, motor, and/or autonomic systems, resulting in permanent pain, numbness, and/or weakness of one or several areas of the body; allergic reactions; (i.e.: anaphylactic reaction); and/or death. Furthermore, the patient was informed of those risks and complications associated with the medications. These include, but are not limited to: allergic reactions (i.e.: anaphylactic or anaphylactoid reaction(s)); adrenal axis suppression; blood sugar elevation that in diabetics may result in ketoacidosis or comma; water retention that in patients with history of congestive heart failure may result in shortness of breath, pulmonary edema, and decompensation with resultant heart failure; weight gain; swelling or edema; medication-induced neural toxicity; particulate matter embolism and blood vessel occlusion with resultant organ, and/or nervous system infarction; and/or aseptic necrosis of one or more joints. Finally, the patient was informed that Medicine is not an exact science; therefore, there is also the possibility of unforeseen or unpredictable risks and/or possible complications that may result in a catastrophic outcome. The patient indicated having understood very clearly. We have given the patient no guarantees and we have made no promises. Enough time was given to the patient to ask questions, all of which were answered to the patient's satisfaction. Lauren Greene has indicated that she wanted to continue with the procedure. Attestation: I, the ordering provider, attest that I have discussed with the patient the benefits, risks, side-effects, alternatives, likelihood of achieving goals, and potential problems during recovery for the procedure that I have provided informed consent. Date  Time: 06/30/2023  1:49 PM  Pre-Procedure Preparation:  Monitoring: As per clinic protocol. Respiration, ETCO2, SpO2, BP, heart rate and  rhythm monitor placed and checked for adequate function Safety Precautions: Patient was assessed for positional comfort and pressure points before starting the procedure. Time-out: I initiated and conducted the "Time-out" before starting the procedure, as per protocol. The patient was asked to participate by confirming the accuracy of the "Time Out" information. Verification of the correct person, site, and procedure were performed and confirmed by me, the nursing staff, and the patient. "Time-out" conducted as per Joint Commission's Universal Protocol (UP.01.01.01). Time: 1515 Start Time: 1515 hrs.  Description of Procedure:  Procedural Technique Safety Precautions: Aspiration looking for blood return was conducted prior to all injections. At no point did we inject any substances, as a needle was being advanced. No attempts were made at seeking any paresthesias. Safe injection practices and needle disposal techniques used. Medications properly checked for expiration dates. SDV (single dose vial) medications used. Description of the Procedure: Protocol guidelines were followed. The patient was placed in position over the procedure table. The target area was identified and the area prepped in the usual manner. Skin & deeper tissues infiltrated with local anesthetic. Appropriate amount of time allowed to pass for local anesthetics to take effect. The procedure needles were then advanced to the target area. Proper needle placement secured. Negative aspiration confirmed. Solution injected in intermittent fashion, asking for systemic symptoms every 0.5cc of injectate. The needles were then removed and the area cleansed, making sure to leave some of the prepping solution back to take advantage of its long term bactericidal properties.  10 cc solution made of 9 cc of 0.2% ropivacaine, 1 cc of methylprednisolone, 40 mg/cc. 5 cc injected for the left suprascapular nerve 5 cc injected for the right  suprascapular nerve  Vitals:   06/30/23 1352 06/30/23 1515 06/30/23 1525 06/30/23 1535  BP: (!) 142/85 136/79 (!) 143/84 (!) 144/82  Pulse: 63 (!) 56 (!) 55 (!) 56  Resp:  16 16 16   Temp: (!) 97.2 F (36.2 C)     SpO2: 97% 100% 98% 100%  Weight: 255 lb (115.7 kg)     Height: 5\' 8"  (1.727 m)        Start Time: 1515 hrs. End Time: 1531 hrs.  Imaging Guidance (Spinal):          Type of Imaging Technique: Fluoroscopy Guidance (Spinal) Indication(s): Fluoroscopy guidance for needle placement to enhance accuracy in procedures requiring precise needle localization for targeted delivery of medication in or near specific anatomical locations not easily accessible without such real-time imaging assistance. Exposure Time: Please see nurses notes. Contrast: Before injecting any contrast, we confirmed that the patient did not have an allergy to iodine, shellfish, or radiological contrast. Once satisfactory needle placement was completed at the desired level, radiological contrast was injected. Contrast injected under live fluoroscopy. No contrast complications. See chart for type and volume of contrast used. Fluoroscopic Guidance: I was personally present during the use of fluoroscopy. "Tunnel Vision Technique" used to obtain the best possible view of the target area. Parallax error corrected before commencing the procedure. "Direction-depth-direction" technique used to introduce the needle under continuous pulsed fluoroscopy. Once target was reached, antero-posterior, oblique, and lateral fluoroscopic projection used confirm needle placement in all planes. Images permanently stored in EMR. Interpretation: I personally interpreted the imaging intraoperatively. Adequate needle placement confirmed in multiple planes. Appropriate spread of contrast into desired area was observed. No evidence of afferent or efferent intravascular uptake. No intrathecal or subarachnoid spread observed. Permanent images saved  into the patient's record.  Antibiotic Prophylaxis:   Anti-infectives (From admission, onward)    None      Indication(s): None identified  Post-operative Assessment:  Post-procedure Vital Signs:  Pulse/HCG Rate: (!) 56  Temp: (!) 97.2 F (36.2 C) Resp: 16 BP: (!) 144/82 SpO2: 100 %  EBL: None  Complications: No immediate post-treatment complications observed by team, or reported by patient.  Note: The patient tolerated the entire procedure well. A repeat set of vitals were taken after the procedure and the patient was kept under observation following institutional policy, for this  type of procedure. Post-procedural neurological assessment was performed, showing return to baseline, prior to discharge. The patient was provided with post-procedure discharge instructions, including a section on how to identify potential problems. Should any problems arise concerning this procedure, the patient was given instructions to immediately contact us, at any time, without hesitation. In any case, we plan to contact the patient by telephone for a follow-up status report regarding this interventional procedure.  Comments:  No additional relevant information.  Plan of Care (POC)  Orders:  Orders Placed This Encounter  Procedures   DG PAIN CLINIC C-ARM 1-60 MIN NO REPORT    Intraoperative interpretation by procedural physician at [facility].    Standing Status:   Standing    Number of Occurrences:   1    Reason for exam::   Assistance in needle guidance and placement for procedures requiring needle placement in or near specific anatomical locations not easily accessible without such assistance.   Ambulatory referral to Neurosurgery    Referral Priority:   Routine    Referral Type:   Surgical    Referral Reason:   Specialty Services Required    Referred to Provider:   Venetia Night, MD    Requested Specialty:   Neurosurgery    Number of Visits Requested:   1   Chronic  Opioid Analgesic:   Percocet 20 mg BID (MME 60)    Medications ordered for procedure: Meds ordered this encounter  Medications   iohexol (OMNIPAQUE) 180 MG/ML injection 10 mL    Must be Myelogram-compatible. If not available, you may substitute with a water-soluble, non-ionic, hypoallergenic, myelogram-compatible radiological contrast medium.   lidocaine (XYLOCAINE) 2 % (with pres) injection 400 mg   ropivacaine (PF) 2 mg/mL (0.2%) (NAROPIN) injection 18 mL   dexamethasone (DECADRON) injection 20 mg   methylPREDNISolone acetate (DEPO-MEDROL) injection 40 mg   Medications administered: We administered iohexol, lidocaine, ropivacaine (PF) 2 mg/mL (0.2%), dexamethasone, and methylPREDNISolone acetate.  See the medical record for exact dosing, route, and time of administration.  Follow-up plan:   Return for Keep sch. appt.      Recent Visits Date Type Provider Dept  04/29/23 Office Visit Edward Jolly, MD [facility]  Showing recent visits within past 90 days and meeting all other requirements Today's Visits Date Type Provider Dept  06/30/23 Procedure visit Edward Jolly, MD [facility]  Showing today's visits and meeting all other requirements Future Appointments Date Type Provider Dept  08/26/23 Appointment Edward Jolly, MD [facility]  Showing future appointments within next 90 days and meeting all other requirements  Disposition: Discharge home  Discharge (Date  Time): 06/30/2023; 1540 hrs.   Primary Care Physician: Babs Sciara, MD Location: [facility] Note by: Edward Jolly, MD (TTS technology used. I apologize for any typographical errors that were not detected and corrected.) Date: 06/30/2023; Time: 4:26 PM  Disclaimer:  Medicine is not an Visual merchandiser. The only guarantee in medicine is that nothing is guaranteed. It is important to note that the decision to proceed with this intervention was based on the  information collected from the patient. The Data and conclusions were drawn from the patient's questionnaire, the interview, and the physical examination. Because the information was provided in large part by the patient, it cannot be guaranteed that it has not been purposely or unconsciously manipulated. Every effort has been made to obtain as much relevant data as possible for this evaluation. It is important to  note that the conclusions that lead to this procedure are derived in large part from the available data. Always take into account that the treatment will also be dependent on availability of resources and existing treatment guidelines, considered by other Pain Management Practitioners as being common knowledge and practice, at the time of the intervention. For Medico-Legal purposes, it is also important to point out that variation in procedural techniques and pharmacological choices are the acceptable norm. The indications, contraindications, technique, and results of the above procedure should only be interpreted and judged by a Board-Certified Interventional Pain Specialist with extensive familiarity and expertise in the same exact procedure and technique.

## 2023-06-30 NOTE — Patient Instructions (Signed)

## 2023-06-30 NOTE — Progress Notes (Signed)
 Safety precautions to be maintained throughout the outpatient stay will include: orient to surroundings, keep bed in low position, maintain call bell within reach at all times, provide assistance with transfer out of bed and ambulation.

## 2023-07-01 ENCOUNTER — Telehealth: Payer: Self-pay | Admitting: *Deleted

## 2023-07-01 NOTE — Telephone Encounter (Signed)
 Attempted to call for post procedure follow-up. Patient answered the phone, the call was disconnected .

## 2023-07-09 ENCOUNTER — Ambulatory Visit: Payer: PPO | Admitting: Family Medicine

## 2023-07-09 VITALS — BP 128/78 | HR 77 | Temp 97.3°F | Ht 68.0 in | Wt 249.0 lb

## 2023-07-09 DIAGNOSIS — I10 Essential (primary) hypertension: Secondary | ICD-10-CM | POA: Diagnosis not present

## 2023-07-09 DIAGNOSIS — E785 Hyperlipidemia, unspecified: Secondary | ICD-10-CM

## 2023-07-09 DIAGNOSIS — F901 Attention-deficit hyperactivity disorder, predominantly hyperactive type: Secondary | ICD-10-CM

## 2023-07-09 DIAGNOSIS — L659 Nonscarring hair loss, unspecified: Secondary | ICD-10-CM

## 2023-07-09 MED ORDER — DULOXETINE HCL 30 MG PO CPEP: ORAL_CAPSULE | ORAL | 5 refills | Status: DC

## 2023-07-09 MED ORDER — FINASTERIDE 5 MG PO TABS: 5.0000 mg | ORAL_TABLET | Freq: Every day | ORAL | 5 refills | Status: DC

## 2023-07-09 MED ORDER — AMPHETAMINE-DEXTROAMPHET ER 30 MG PO CP24: ORAL_CAPSULE | ORAL | 0 refills | Status: DC

## 2023-07-09 MED ORDER — DICLOFENAC SODIUM 75 MG PO TBEC: 75.0000 mg | DELAYED_RELEASE_TABLET | Freq: Two times a day (BID) | ORAL | 5 refills | Status: DC

## 2023-07-09 MED ORDER — PRAVASTATIN SODIUM 20 MG PO TABS: ORAL_TABLET | ORAL | 1 refills | Status: DC

## 2023-07-09 MED ORDER — AMLODIPINE BESYLATE 2.5 MG PO TABS: 2.5000 mg | ORAL_TABLET | Freq: Every day | ORAL | 1 refills | Status: DC

## 2023-07-09 MED ORDER — OMEPRAZOLE 40 MG PO CPDR: 40.0000 mg | DELAYED_RELEASE_CAPSULE | Freq: Every day | ORAL | 1 refills | Status: DC

## 2023-07-09 NOTE — Progress Notes (Signed)
   Subjective:    Patient ID: Lauren Greene, female    DOB: May 26, 1965, 58 y.o.   MRN: [id number]  HPI  Letter to get mail box moved closer to house  Excess sweating and shaky-these spells occur intermittently during the week sometimes during the day sometimes at night there does not seem to be any rhyme or reason to it.  Patient states has been coming on more so over the past few months.  No passing out spells.  This patient has adult ADD. Takes medication responsibly. Medication does help the patient focus in be more functional. Patient relates that they are or not abusing the medication or misusing the medication. The patient understands that if they're having any negative side effects such as elevated high blood pressure severe headaches they would need stop the medication follow-up immediately. They also understand that the prescriptions are to last for 3 months then the patient will need to follow-up before having further prescriptions.  Patient compliance daily 2 -30 mg in am   Does medication help patient function /attention better  some  Side effects  none  Patient also suffering with hair loss more in the temporal areas.  She does have to walk with her hair some but denies putting any excessive tension on it.  She states her hair is thinning.  Review of Systems     Objective:   Physical Exam  General-in no acute distress Eyes-no discharge Lungs-respiratory rate normal, CTA CV-no murmurs,RRR Extremities skin warm dry no edema Neuro grossly normal Behavior normal, alert Hair is thinning around the temporal regions.  Could well be estrogen changes associated with this I doubt that it is due to traction on that hair      Assessment & Plan:   1. Attention deficit hyperactivity disorder (ADHD), predominantly hyperactive type She does well with her ADD medicines 3 scripts sent in Follow-up if any ongoing troubles follow-up within 3 to 4 months -  amphetamine -dextroamphetamine  (ADDERALL XR) 30 MG 24 hr capsule; 2 every morning as directed  Dispense: 60 capsule; Refill: 0 - amphetamine -dextroamphetamine  (ADDERALL XR) 30 MG 24 hr capsule; TAKE (2) CAPSULES BY MOUTH EVERY MORNING. MUST LAST 30 DAYS.  Dispense: 60 capsule; Refill: 0 - amphetamine -dextroamphetamine  (ADDERALL XR) 30 MG 24 hr capsule; 2 every morning as directed  Dispense: 60 capsule; Refill: 0  2. Primary hypertension Blood pressure doing well continue current measures - amLODipine  (NORVASC ) 2.5 MG tablet; Take 1 tablet (2.5 mg total) by mouth daily.  Dispense: 90 tablet; Refill: 1  3. Hyperlipidemia, unspecified hyperlipidemia type Lipid doing well continue current measures - pravastatin  (PRAVACHOL ) 20 MG tablet; One on Mon Wed and Friday  Dispense: 36 tablet; Refill: 1  4. Hair loss (Primary) We will try finasteride .  This is off label but is used by dermatologist If this does not show significant benefit over the next 3 months recommend stopping it   As for the sweats patient states she has tried various formulations of hormones and that did not help Newer medications are not covered by her insurance I doubt that this is a sign of lymphoma.  It is not true night sweats.  She is not having weight loss.  Her previous lab work is look good.  I do not feel she needs to have any type of scans currently Likelihood of pheochromocytoma is low Follow-up 3 to 4 months

## 2023-07-11 ENCOUNTER — Other Ambulatory Visit: Payer: Self-pay | Admitting: Family Medicine

## 2023-07-11 ENCOUNTER — Inpatient Hospital Stay
Admission: RE | Admit: 2023-07-11 | Discharge: 2023-07-11 | Disposition: A | Discharge status: Home / Self Care | Payer: Self-pay | Source: Ambulatory Visit | Attending: Neurosurgery | Admitting: Neurosurgery

## 2023-07-11 DIAGNOSIS — Z049 Encounter for examination and observation for unspecified reason: Secondary | ICD-10-CM

## 2023-07-26 ENCOUNTER — Other Ambulatory Visit: Payer: Self-pay | Admitting: Family Medicine

## 2023-07-28 NOTE — Progress Notes (Unsigned)
 Referring Physician:  Cephus Collin, MD [address] [place],  [place] [address]  Primary Physician:  Bennet Brasil, MD  History of Present Illness: 07/29/2023 Lauren Greene is here today with a chief complaint of nonfunctional spinal cord stimulator.  She had a spinal cord stimulator placed 2 years ago.  It has never worked significantly for her.  She would like it removed.  She is also having right foot pain. Bowel/Bladder Dysfunction: none  Conservative measures:  Physical therapy: has not participated in PT  Multimodal medical therapy including regular antiinflammatories: Cymbalta , Gabapentin , Oxycodone , Robaxin   Injections: 06/30/2023  L3, L4, and L5 Medial Branch Level(s).   Past Surgery: 02/26/2021 SCS placement Surgeon: Berta Brittle, MD  The symptoms are causing a significant impact on the patient's life.   I have utilized the care everywhere function in epic to review the outside records available from external health systems.  Review of Systems:  A 10 point review of systems is negative, except for the pertinent positives and negatives detailed in the HPI.  Past Medical History: Past Medical History:  Diagnosis Date   ADHD (attention deficit hyperactivity disorder)    Ankylosing spondylitis (HCC)    Arthritis    Back pain    Broken femur (HCC) 08/27/2012   Chronic pain    chronic opioid use   GERD (gastroesophageal reflux disease)    Heart murmur    asymptomatic   History of methicillin resistant staphylococcus aureus (MRSA) 2009   Hypertension    Migraine    Osteoarthritis    PVC (premature ventricular contraction)    Raynaud disease    Raynaud phenomenon 04/06/2018   [facility] podiatry diagnosed 2020    Past Surgical History: Past Surgical History:  Procedure Laterality Date   ACHILLES TENDON LENGTHENING Right 02/01/2020   Procedure: ACHILLES TENDON LENGTHENING;  Surgeon: Donnamarie Gables, MD;  Location: [facility];   Service: Orthopedics;  Laterality: Right;   ARTHRODESIS Right    BACK SURGERY     BLADDER SURGERY     BUNIONECTOMY     CERVICAL BIOPSY  W/ LOOP ELECTRODE EXCISION  1990's   in [place], [place]   ENDOMETRIAL BIOPSY  04-05-05 and 02-12-06   04-05-05 benign but too rapid growth. RXd Provera  and repeat Bx 58mo. 02-12-06 Bx benign--Dr. Cay Cocking   FEMUR IM NAIL Left 08/27/2012   Procedure: INTRAMEDULLARY (IM) RETROGRADE FEMORAL NAILING;  Surgeon: Arnie Lao, MD;  Location: [facility] OR;  Service: Orthopedics;  Laterality: Left;   FOOT ARTHRODESIS Right 02/01/2020   Procedure: TRIPLE ARTHRODESIS,  TENDO ACHILLES LENGTHENING;  Surgeon: Donnamarie Gables, MD;  Location: [facility];  Service: Orthopedics;  Laterality: Right;  LENGTH OF SURGERY: 4 HOURS   HAMMER TOE SURGERY Right    HEEL SPUR SURGERY     IM NAILING FEMORAL SHAFT FRACTURE Left 08/27/2012   Dr Lucienne Ryder   KNEE SURGERY     multiple   OSTEOTOMY     SHOULDER SURGERY Left    SPINAL CORD STIMULATOR INSERTION N/A 02/26/2021   Procedure: THORACIC SPINAL CORD STIMULATOR PLACEMENT, PULSE GENERATOR;  Surgeon: Berta Brittle, MD;  Location: [facility]S;  Service: Neurosurgery;  Laterality: N/A;   TOTAL KNEE ARTHROPLASTY Bilateral    2009 and 2012   TUBAL LIGATION     VAGINAL HYSTERECTOMY  2018    Allergies: Allergies as of 07/29/2023 - Review Complete 07/29/2023  Allergen Reaction Noted   Wellbutrin [bupropion]  06/21/2015   Crestor  [  rosuvastatin ]  09/27/2022   Lyrica  [pregabalin ] Swelling 08/03/2014    Medications:  Current Outpatient Medications:    amLODipine  (NORVASC ) 2.5 MG tablet, Take 1 tablet (2.5 mg total) by mouth daily., Disp: 90 tablet, Rfl: 1   amphetamine -dextroamphetamine  (ADDERALL XR) 30 MG 24 hr capsule, 2 every morning as directed, Disp: 60 capsule, Rfl: 0   amphetamine -dextroamphetamine  (ADDERALL XR) 30 MG 24 hr capsule, TAKE (2) CAPSULES BY MOUTH EVERY MORNING. MUST LAST 30 DAYS., Disp: 60  capsule, Rfl: 0   amphetamine -dextroamphetamine  (ADDERALL XR) 30 MG 24 hr capsule, 2 every morning as directed, Disp: 60 capsule, Rfl: 0   clonazePAM  (KLONOPIN ) 0.5 MG tablet, Take 1 tablet (0.5 mg total) by mouth 2 (two) times daily as needed for anxiety., Disp: 10 tablet, Rfl: 0   diclofenac  (VOLTAREN ) 75 MG EC tablet, Take 1 tablet (75 mg total) by mouth 2 (two) times daily., Disp: 60 tablet, Rfl: 5   DULoxetine  (CYMBALTA ) 30 MG capsule, 3 qd, Disp: 90 capsule, Rfl: 5   ENBREL SURECLICK 50 MG/ML injection, Inject 50 mg into the skin once a week., Disp: , Rfl:    finasteride  (PROSCAR ) 5 MG tablet, Take 1 tablet (5 mg total) by mouth daily., Disp: 30 tablet, Rfl: 5   lidocaine  (XYLOCAINE ) 2 % solution, TAKE 5 ML TWICE DAILY AS NEEDED FOR CANKER SORES., Disp: 100 mL, Rfl: 1   lidocaine  (XYLOCAINE ) 5 % ointment, APPLY TO AFFECTED AREA TWICE DAILY. NO MORE THAN 5 DAYS AT A TIME., Disp: 35.44 g, Rfl: 4   methocarbamol  (ROBAXIN ) 750 MG tablet, Take 1 tablet (750 mg total) by mouth every 8 (eight) hours as needed for muscle spasms., Disp: 90 tablet, Rfl: 5   nystatin  (MYCOSTATIN /NYSTOP ) powder, APPLY AS DIRECTED TO AFFECTED AREAS SEVERAL TIMES PER DAY. MAX FOUR TIMES., Disp: 120 g, Rfl: 1   omeprazole  (PRILOSEC) 40 MG capsule, Take 1 capsule (40 mg total) by mouth daily., Disp: 90 capsule, Rfl: 1   oxyCODONE -acetaminophen  (PERCOCET) 10-325 MG tablet, Take 1-2 tablets by mouth 2 (two) times daily as needed for pain (moderate pain)., Disp: 120 tablet, Rfl: 0   [START ON 08/10/2023] oxyCODONE -acetaminophen  (PERCOCET) 10-325 MG tablet, Take 1-2 tablets by mouth 2 (two) times daily as needed for pain (moderate pain)., Disp: 120 tablet, Rfl: 0   pravastatin  (PRAVACHOL ) 20 MG tablet, One on Mon Wed and Friday, Disp: 36 tablet, Rfl: 1   silver  sulfADIAZINE  (SSD) 1 % cream, APPLY TO WOUNDS ON FEET TWICE DAILY., Disp: 85 g, Rfl: 2   traZODone  (DESYREL ) 100 MG tablet, Take 100-200 mg by mouth at bedtime as needed.,  Disp: , Rfl:    Vitamin D , Ergocalciferol , (DRISDOL ) 1.25 MG (50000 UNIT) CAPS capsule, TAKE 1 CAPSULE ONCE EVERY 7 DAYS., Disp: 4 capsule, Rfl: 1  Social History: Social History   Tobacco Use   Smoking status: Former    Current packs/day: 0.00    Average packs/day: 2.0 packs/day for 10.0 years (20.0 ttl pk-yrs)    Types: Cigarettes    Start date: 08/17/1990    Quit date: 08/16/2000    Years since quitting: 22.9   Smokeless tobacco: Never  Vaping Use   Vaping status: Never Used  Substance Use Topics   Alcohol use: Yes    Comment: rarely   Drug use: No    Family Medical History: Family History  Problem Relation Age of Onset   Cancer Mother    Breast cancer Mother    Bipolar disorder Mother    Heart failure  Father    Cancer Father    Heart disease Father    Bipolar disorder Sister    Cancer Other    Heart failure Other     Physical Examination: Vitals:   07/29/23 1315  BP: 132/78    General: Patient is in no apparent distress. Attention to examination is appropriate.  Neck:   Supple.  Full range of motion.  Respiratory: Patient is breathing without any difficulty.   NEUROLOGICAL:     Awake, alert, oriented to person, place, and time.  Speech is clear and fluent.   Cranial Nerves: Pupils equal round and reactive to light.  Facial tone is symmetric.  Facial sensation is symmetric. Shoulder shrug is symmetric. Tongue protrusion is midline.  There is no pronator drift.  Strength: Side Biceps Triceps Deltoid Interossei Grip Wrist Ext. Wrist Flex.  R 5 5 5 5 5 5 5   L 5 5 5 5 5 5 5    Side Iliopsoas Quads Hamstring PF DF EHL  R 5 5 5 5 5 5   L 5 5 5 5 5 5    Reflexes are 1+ and symmetric at the biceps, triceps, brachioradialis, patella and achilles.   Hoffman's is absent.   Bilateral upper and lower extremity sensation is intact to light touch.    No evidence of dysmetria noted.  Gait is abnormal - antalgic.     Medical Decision Making  Imaging: Xrays  reviews  I have personally reviewed the images and agree with the above interpretation.  Assessment and Plan: Lauren Greene is a pleasant 58 y.o. female with prior placement of a percutaneous spinal cord stimulator.  It is nonfunctional.  She would like to have it removed.  She is also having right foot pain.  I will refer her to an orthopedic surgeon for this.  We discussed removal of her spinal cord stimulator.  I discussed the planned procedure at length with the patient, including the risks, benefits, alternatives, and indications. The risks discussed include but are not limited to bleeding, infection, need for reoperation, spinal fluid leak, stroke, vision loss, anesthetic complication, coma, paralysis, and even death. I also described in detail that improvement was not guaranteed.  The patient expressed understanding of these risks, and asked that we proceed with surgery. I described the surgery in layman's terms, and gave ample opportunity for questions, which were answered to the best of my ability.   I spent a total of 30 minutes in this patient's care today. This time was spent reviewing pertinent records including imaging studies, obtaining and confirming history, performing a directed evaluation, formulating and discussing my recommendations, and documenting the visit within the medical record.      Thank you for involving me in the care of this patient.      Skila Rollins K. Mont Antis MD, [facility] Neurosurgery

## 2023-07-29 ENCOUNTER — Ambulatory Visit (INDEPENDENT_AMBULATORY_CARE_PROVIDER_SITE_OTHER): Admitting: Neurosurgery

## 2023-07-29 ENCOUNTER — Encounter: Payer: Self-pay | Admitting: Neurosurgery

## 2023-07-29 ENCOUNTER — Other Ambulatory Visit: Payer: Self-pay

## 2023-07-29 VITALS — BP 132/78 | Ht 68.0 in | Wt 254.0 lb

## 2023-07-29 DIAGNOSIS — M79671 Pain in right foot: Secondary | ICD-10-CM

## 2023-07-29 DIAGNOSIS — T85192A Other mechanical complication of implanted electronic neurostimulator (electrode) of spinal cord, initial encounter: Secondary | ICD-10-CM

## 2023-07-29 DIAGNOSIS — T85192S Other mechanical complication of implanted electronic neurostimulator (electrode) of spinal cord, sequela: Secondary | ICD-10-CM

## 2023-07-29 DIAGNOSIS — Z01818 Encounter for other preprocedural examination: Secondary | ICD-10-CM

## 2023-07-29 NOTE — Patient Instructions (Addendum)
 Please see below for information in regards to your upcoming surgery:  Planned surgery: Removal of Spinal Cord Stimulator   Surgery date: 6/ 23/2024 at [facility]: [address], [place], [place] [address] - you will find out your arrival time the business day before your surgery.   Pre-op appointment at [facility] Pre-admit Testing: you will receive a call with a date/time for this appointment. If you are scheduled for an in person appointment, Pre-admit Testing is located on the first floor of the [facility] building, [address] [facility], [address]. During this appointment, they will advise you which medications you can take the morning of surgery, and which medications you will need to hold for surgery. Labs (such as blood work, EKG) may be done at your pre-op appointment. You are not required to fast for these labs. Should you need to change your pre-op appointment, please call Pre-admit testing at [phone].   Enbrel: Hold 2 weeks before and restart 2 weeks after   Surgical clearance: we may send a clearance form to Dr. Fairy Homer. They may wish to see you in their office prior to signing the clearance form. If so, they may call you to schedule an appointment.   Common restrictions after surgery: No bending, lifting, or twisting ("BLT"). Avoid lifting objects heavier than 10 pounds for the first 6 weeks after surgery. Where possible, avoid household activities that involve lifting, bending, reaching, pushing, or pulling such as laundry, vacuuming, grocery shopping, and childcare. Try to arrange for help from friends and family for these activities while you heal. Do not drive while taking prescription pain medication. Weeks 6 through 12 after surgery: avoid lifting more than 25 pounds.    How to contact us :  If you have any questions/concerns before or after surgery, you can reach us  at [phone], or you can send a mychart message. We can  be reached by phone or mychart 8am-4pm, Monday-Friday.  *Please note: Calls after 4pm are forwarded to a third party answering service. Mychart messages are not routinely monitored during evenings, weekends, and holidays. Please call our office to contact the answering service for urgent concerns during non-business hours.   Appointments/FMLA & disability paperwork: Gerlean Kocher, & Maryann Smalls Registered Nurses/Surgery schedulers: Kendelyn & Mckala Pantaleon Medical Assistants: Donnajean Fuse Physician Assistants: Ludwig Safer, PA-C, Anastacio Karvonen, PA-C & Lucetta Russel, PA-C Surgeons: Jodeen Munch, MD & Henderson Lock, MD   [facility] PREADMIT TESTING VISIT and SURGERY INFORMATION SHEET   Now that surgery has been scheduled you can anticipate several phone calls from [facility] services. A pharmacy technician will call you to verify your current list of medications taken at home.               The Pre-Service Center will call to verify your insurance information and to give you billing estimates and information.             The Preadmit Testing Office will be calling to schedule a visit to obtain information for the anesthesia team and provide instructions on preparation for surgery.  What can you expect for the Preadmit Testing Visit: Appointments may be scheduled in-person or by telephone.  If a telephone visit is scheduled, you may be asked to come into the office to have lab tests or other studies performed.   This visit will not be completed any greater than 14 days prior to your surgery.  If your surgery has been scheduled for  a future date, please do not be alarmed if we have not contacted you to schedule an appointment more than a month prior to the surgery date.    Please be prepared to provide the following information during this appointment:            -Personal medical history                                               -Medication and allergy list            -Any  history of problems with anesthesia              -Recent lab work or diagnostic studies            -Please notify us  of any needs we should be aware of to provide the best care possible           -You will be provided with instructions on how to prepare for your surgery.    On The Day of Surgery:  You must have a driver to take you home after surgery, you will be asked not to drive for 24 hours following surgery.  Taxi, [employer] and non-medical transport will not be acceptable means of transportation unless you have a responsible individual who will be traveling with you.  Visitors in the surgical area:   2 people will be able to visit you in your room once your preparation for surgery has been completed. During surgery, your visitors will be asked to wait in the Surgery Waiting Area.  It is not a requirement for them to stay, if they prefer to leave and come back.  Your visitor(s) will be given an update once the surgery has been completed.  No visitors are allowed in the initial recovery room to respect patient privacy and safety.  Once you are more awake and transfer to the secondary recovery area, or are transferred to an inpatient room, visitors will again be able to see you.  To respect and protect your privacy: We will ask on the day of surgery who your driver will be and what the contact number for that individual will be. We will ask if it is okay to share information with this individual, or if there is an alternative individual that we, or the surgeon, should contact to provide updates and information. If family or friends come to the surgical information desk requesting information about you, who you have not listed with us , no information will be given.   It may be helpful to designate someone as the main contact who will be responsible for updating your other friends and family.    PREADMIT TESTING OFFICE: [phone] SAME DAY SURGERY: [phone] We look forward to caring for  you before and throughout the process of your surgery.

## 2023-07-30 ENCOUNTER — Encounter: Payer: Self-pay | Admitting: Family Medicine

## 2023-07-30 ENCOUNTER — Other Ambulatory Visit: Payer: Self-pay

## 2023-07-30 DIAGNOSIS — Z1211 Encounter for screening for malignant neoplasm of colon: Secondary | ICD-10-CM

## 2023-08-11 DIAGNOSIS — Z1211 Encounter for screening for malignant neoplasm of colon: Secondary | ICD-10-CM | POA: Diagnosis not present

## 2023-08-11 DIAGNOSIS — Z1212 Encounter for screening for malignant neoplasm of rectum: Secondary | ICD-10-CM | POA: Diagnosis not present

## 2023-08-16 LAB — COLOGUARD: COLOGUARD: NEGATIVE

## 2023-08-17 ENCOUNTER — Ambulatory Visit: Payer: Self-pay | Admitting: Family Medicine

## 2023-08-22 ENCOUNTER — Other Ambulatory Visit: Payer: Self-pay | Admitting: Family Medicine

## 2023-08-26 ENCOUNTER — Encounter: Payer: PPO | Admitting: Nurse Practitioner

## 2023-08-29 ENCOUNTER — Encounter: Payer: Self-pay | Admitting: Neurosurgery

## 2023-09-01 ENCOUNTER — Inpatient Hospital Stay: Admission: RE | Admit: 2023-09-01 | Source: Ambulatory Visit

## 2023-09-02 ENCOUNTER — Encounter: Payer: Self-pay | Admitting: Nurse Practitioner

## 2023-09-02 ENCOUNTER — Ambulatory Visit: Attending: Student in an Organized Health Care Education/Training Program | Admitting: Nurse Practitioner

## 2023-09-02 VITALS — BP 124/80 | HR 65 | Temp 97.1°F | Resp 18 | Ht 68.0 in | Wt 248.0 lb

## 2023-09-02 DIAGNOSIS — M19011 Primary osteoarthritis, right shoulder: Secondary | ICD-10-CM | POA: Diagnosis not present

## 2023-09-02 DIAGNOSIS — M961 Postlaminectomy syndrome, not elsewhere classified: Secondary | ICD-10-CM | POA: Diagnosis not present

## 2023-09-02 DIAGNOSIS — Z79891 Long term (current) use of opiate analgesic: Secondary | ICD-10-CM | POA: Diagnosis not present

## 2023-09-02 DIAGNOSIS — Z9889 Other specified postprocedural states: Secondary | ICD-10-CM | POA: Insufficient documentation

## 2023-09-02 DIAGNOSIS — G8929 Other chronic pain: Secondary | ICD-10-CM | POA: Diagnosis not present

## 2023-09-02 DIAGNOSIS — M19012 Primary osteoarthritis, left shoulder: Secondary | ICD-10-CM

## 2023-09-02 DIAGNOSIS — M48062 Spinal stenosis, lumbar region with neurogenic claudication: Secondary | ICD-10-CM

## 2023-09-02 DIAGNOSIS — M5416 Radiculopathy, lumbar region: Secondary | ICD-10-CM | POA: Diagnosis not present

## 2023-09-02 DIAGNOSIS — M4726 Other spondylosis with radiculopathy, lumbar region: Secondary | ICD-10-CM | POA: Diagnosis not present

## 2023-09-02 DIAGNOSIS — M47816 Spondylosis without myelopathy or radiculopathy, lumbar region: Secondary | ICD-10-CM | POA: Diagnosis not present

## 2023-09-02 DIAGNOSIS — G894 Chronic pain syndrome: Secondary | ICD-10-CM | POA: Insufficient documentation

## 2023-09-02 MED ORDER — OXYCODONE-ACETAMINOPHEN 10-325 MG PO TABS: 1.0000 | ORAL_TABLET | Freq: Two times a day (BID) | ORAL | 0 refills | Status: AC | PRN

## 2023-09-02 NOTE — Progress Notes (Signed)
 Nursing Pain Medication Assessment:  Safety precautions to be maintained throughout the outpatient stay will include: orient to surroundings, keep bed in low position, maintain call bell within reach at all times, provide assistance with transfer out of bed and ambulation.   Medication Inspection Compliance: Pill count conducted under aseptic conditions, in front of the patient. Neither the pills nor the bottle was removed from the patient's sight at any time. Once count was completed pills were immediately returned to the patient in their original bottle.  Medication: Oxycodone /APAP Pill/Patch Count: 0 of 120 pills/patches remain Pill/Patch Appearance: Markings consistent with prescribed medication Bottle Appearance: Standard pharmacy container. Clearly labeled. Filled Date: 05 / 13 / 2025 Last Medication intake:  Today

## 2023-09-02 NOTE — Progress Notes (Signed)
 Patient upset with change and would like to see Dr Rhesa Celeste.. talked with patient and asked if she would like to speak with our supervisor or even see Dr Rhesa Celeste. Patient crying and states she does not like change.

## 2023-09-02 NOTE — Progress Notes (Signed)
 History of present illness (HPI) [name], a 58 y.o. year old female, is here today because of her Chronic pain syndrome [G89.4]. [name] primary complain today is Back Pain (Lower right )   Pain Assessment: Severity of Chronic pain is reported as a 7 /10. Location: Back Lower, Right/radiates from lower back into entire legs bilateral down toes bilateral. Onset: More than a month ago. Quality: Constant (electrical). Timing: Intermittent. Modifying factor(s): Oxycodone /APAP helps some. Vitals:  height is 5' 8 (1.727 m) and weight is 248 lb (112.5 kg). Her temporal temperature is 97.1 F (36.2 C) (abnormal). Her blood pressure is 124/80 and her pulse is 65. Her respiration is 18 and oxygen saturation is 97%.  BMI: Estimated body mass index is 37.71 kg/m as calculated from the following:   Height as of this encounter: 5' 8 (1.727 m).   Weight as of this encounter: 248 lb (112.5 kg).  Last encounter: 04/29/2023 Last procedure: 06/30/2023  Reason for encounter: both, medication management and post-procedure evaluation and assessment. No change in medical history since last visit.  Patient's pain is at baseline.  Patient continues multimodal pain regimen as prescribed.  States that it provides pain relief and improvement in functional status.  She reports initially 100% pain relief and functional improvement that lasted for 3 days and pain returns back to baseline and does not provide adequate relief.   Procedure Type: Lumbar Facet, Medial Branch Block(s) (w/ fluoroscopic mapping)  Laterality: Bilateral  Level: L3, L4, and L5 Medial Branch Level(s). Injecting these levels blocks the L3-4 and L4-5 lumbar facet joints.   Imaging: Fluoroscopic guidance Spinal ([id number]) Anesthesia: Local anesthesia (1-2% Lidocaine ) DOS: 06/30/2023 Performed by: Cephus Collin, MD   Primary Purpose: Diagnostic/Therapeutic Indications: Low back pain severe enough to impact quality of life  or function. 1. Lumbar facet arthropathy   2. Localized osteoarthritis of shoulder regions, bilateral   3. Chronic pain syndrome     NAS-11 Pain score:        Pre-procedure: 9 /10        Post-procedure: 6 /10  Post-Procedure Evaluation   Effectiveness:  Initial hour after procedure: 100 % . Subsequent 4-6 hours post-procedure: 100 % . Analgesia past initial 6 hours: 0 % (Only lasted for 3 days and pain returned fully.) . Ongoing improvement:  Analgesic:  [name] received Lumbar facet medial branch block(s) on June 30, 2023.  She reports initially 100% pain relief and functional improvement that lasted for 3 days and pain returns back to baseline and does not provide adequate relief. Function: Back to baseline ROM: Back to baseline  Procedure Procedure: Suprascapular nerve block (SSNB) #1  Laterality:  Bilateral  Level: Superior to scapular spine, lateral to supraspinatus fossa (Suprascapular notch).  Imaging: Fluoroscopic guidance         Anesthesia: Local anesthesia (1-2% Lidocaine ) DOS: 06/30/2023  Performed by: Cephus Collin, MD   Purpose: Diagnostic/Therapeutic Indications: Shoulder pain, severe enough to impact quality of life and/or function.   NAS-11 score:         Pre-procedure: 9 /10         Post-procedure: 6 /10   Post-Procedure Evaluation   Effectiveness:  Initial hour after procedure: 100 % . Subsequent 4-6 hours post-procedure: 100 % . Analgesia past initial 6 hours: 0 % (Only lasted for 3 days and pain returned fully.) . Ongoing improvement:  Analgesic:  [name] received diagnostic therapeutic suprascapular nerve block on June 30, 2023. She reports initially 100% pain  relief and functional improvement that lasted for 3 days and pain returns back to baseline and does not provide adequate relief. Function: Back to baseline ROM: Back to baseline  Pharmacotherapy Assessment   Analgesic: Oxycodone -acetaminophen  (Percocet) 10-325 mg tablet 1 to 2  tablet 2 times daily as needed. MME=60 Monitoring: [place] PMP: PDMP reviewed during this encounter.       Pharmacotherapy: No side-effects or adverse reactions reported. Compliance: No problems identified. Effectiveness: Clinically acceptable.   UDS:  Summary  Date Value Ref Range Status  01/07/2023 Note  Final    Comment:    ==================================================================== [facility] (MW) ==================================================================== Test                             Result       Flag       Units  Drug Present and Declared for Prescription Verification   Amphetamine                     >4651        EXPECTED   ng/mg creat    Amphetamine  is available as a schedule II prescription drug.    Oxycodone                       4121         EXPECTED   ng/mg creat   Oxymorphone                    988          EXPECTED   ng/mg creat   Noroxycodone                   >4651        EXPECTED   ng/mg creat   Noroxymorphone                 194          EXPECTED   ng/mg creat    Sources of oxycodone  are scheduled prescription medications.    Oxymorphone, noroxycodone, and noroxymorphone are expected    metabolites of oxycodone . Oxymorphone is also available as a    scheduled prescription medication.  Drug Absent but Declared for Prescription Verification   Clonazepam                      Not Detected UNEXPECTED ng/mg creat ==================================================================== Test                      Result    Flag   Units      Ref Range   Creatinine              215              mg/dL      >=40 ==================================================================== Declared Medications:  The flagging and interpretation on this report are based on the  following declared medications.  Unexpected results may arise from  inaccuracies in the declared medications.   **Note: The testing scope of this panel includes these medications:    Amphetamine  (Adderall)  Clonazepam  (Klonopin )  Oxycodone  (Percocet)   **Note: The testing scope of this panel does not include the  following reported medications:   Acetaminophen  (Percocet)  Amlodipine  (Norvasc )  Diclofenac  (Voltaren )  Duloxetine  (Cymbalta )  Gabapentin  (Neurontin )  Lidocaine  (Xylocaine )  Methocarbamol  (Robaxin )  Nystatin  (Mycostatin )  Omeprazole  (Prilosec)  Pravastatin  (  Pravachol )  Sulfadiazine  (Silvadene )  Topiramate  (Topamax )  Trazodone  (Desyrel ) ==================================================================== For clinical consultation, please call [phone]. ====================================================================     No results found for: CBDTHCR No results found for: D8THCCBX No results found for: D9THCCBX  ROS  Constitutional: Denies any fever or chills Gastrointestinal: No reported hemesis, hematochezia, vomiting, or acute GI distress Musculoskeletal: Lower back pain (right) Neurological: No reported episodes of acute onset apraxia, aphasia, dysarthria, agnosia, amnesia, paralysis, loss of coordination, or loss of consciousness  Medication Review  DULoxetine , Vitamin D  (Ergocalciferol ), amLODipine , amphetamine -dextroamphetamine , clonazePAM , diclofenac , etanercept, finasteride , lidocaine , methocarbamol , nystatin , omeprazole , oxyCODONE -acetaminophen , pravastatin , silver  sulfADIAZINE , and traZODone   History Review  Allergy: Ms. Monger is allergic to wellbutrin [bupropion], crestor  [rosuvastatin ], and lyrica  [pregabalin ]. Drug: Ms. Topp  reports no history of drug use. Alcohol:  reports current alcohol use. Tobacco:  reports that she quit smoking about 23 years ago. Her smoking use included cigarettes. She started smoking about 33 years ago. She has a 20 pack-year smoking history. She has never used smokeless tobacco. Social: Ms. Bazar  reports that she quit smoking about 23 years ago. Her smoking use included  cigarettes. She started smoking about 33 years ago. She has a 20 pack-year smoking history. She has never used smokeless tobacco. She reports current alcohol use. She reports that she does not use drugs. Medical:  has a past medical history of ADHD (attention deficit hyperactivity disorder), Ankylosing spondylitis (HCC), Arthritis, Back pain, Broken femur (HCC) (08/27/2012), Chronic pain, GERD (gastroesophageal reflux disease), Heart murmur, History of methicillin resistant staphylococcus aureus (MRSA) (2009), Hypertension, Migraine, Osteoarthritis, PVC (premature ventricular contraction), Raynaud disease, and Raynaud phenomenon (04/06/2018). Surgical: [name]  has a past surgical history that includes Total knee arthroplasty (Bilateral); Heel spur surgery; Back surgery; IM nailing femoral shaft fracture (Left, 08/27/2012); Femur IM nail (Left, 08/27/2012); Cervical biopsy w/ loop electrode excision (1990's); Endometrial biopsy (04-05-05 and 02-12-06); Tubal ligation; Shoulder surgery (Left); Vaginal hysterectomy (2018); Foot arthrodesis (Right, 02/01/2020); Achilles tendon lengthening (Right, 02/01/2020); Knee surgery; Bunionectomy; Spinal cord stimulator insertion (N/A, 02/26/2021); Hammer toe surgery (Right); Osteotomy; Bladder surgery; and Arthrodesis (Right). Family: family history includes Bipolar disorder in her mother and sister; Breast cancer in her mother; Cancer in her father, mother, and another family member; Heart disease in her father; Heart failure in her father and another family member.  Laboratory Chemistry Profile   Renal Lab Results  Component Value Date   BUN 19 09/27/2022   CREATININE 0.74 09/27/2022   BCR 26 (H) 09/27/2022   GFRAA 115 08/30/2019   GFRNONAA >60 02/26/2021    Hepatic Lab Results  Component Value Date   AST 21 09/27/2022   ALT 13 09/27/2022   ALBUMIN 4.2 09/27/2022   ALKPHOS 143 (H) 09/27/2022   LIPASE 25 01/12/2016    Electrolytes Lab Results   Component Value Date   NA 143 09/27/2022   K 4.7 09/27/2022   CL 104 09/27/2022   CALCIUM  9.5 09/27/2022    Bone Lab Results  Component Value Date   VD25OH 31 01/07/2013    Inflammation (CRP: Acute Phase) (ESR: Chronic Phase) Lab Results  Component Value Date   CRP 1 09/27/2022   ESRSEDRATE 4 04/06/2018         Note: Above Lab results reviewed.  Recent Imaging Review  DG PAIN CLINIC C-ARM 1-60 MIN NO REPORT Fluoro was used, but no Radiologist interpretation will be provided.  Please refer to NOTES tab for provider progress note. Note: Reviewed  Physical Exam  General appearance: Well nourished, well developed, and well hydrated. In no apparent acute distress Mental status: Alert, oriented x 3 (person, place, & time)       Respiratory: No evidence of acute respiratory distress Eyes: PERLA Vitals: BP 124/80 (Patient Position: Sitting, Cuff Size: Large)   Pulse 65   Temp (!) 97.1 F (36.2 C) (Temporal)   Resp 18   Ht 5' 8 (1.727 m)   Wt 248 lb (112.5 kg)   LMP 03/19/2003   SpO2 97%   BMI 37.71 kg/m  BMI: Estimated body mass index is 37.71 kg/m as calculated from the following:   Height as of this encounter: 5' 8 (1.727 m).   Weight as of this encounter: 248 lb (112.5 kg). Ideal: Ideal body weight: 63.9 kg (140 lb 14 oz) Adjusted ideal body weight: 83.3 kg (183 lb 11.6 oz)  Assessment   Diagnosis Status  1. Chronic pain syndrome   2. Lumbar facet arthropathy   3. Localized osteoarthritis of shoulder regions, bilateral   4. S/P lumbar microdiscectomy (left L4/5 with micordissection)   5. Spinal stenosis, lumbar region, with neurogenic claudication   6. Failed back surgical syndrome   7. Chronic radicular lumbar pain   8. Lumbar radiculopathy   9. Encounter for long-term opiate analgesic use    Controlled Controlled Controlled   Updated Problems: No problems updated.  Plan of Care  Problem-specific:  Assessment and Plan We will continue on  current medication regimen.  Prescribing drug monitoring (PDMP) reviewed; findings consistent with the use of prescribed medication and no evidence of narcotic misuse.  Urine drug screening (UDS) up-to-date.  Schedule follow-up in 90 days for medication management with Marthe Slain, NP.   Ms. Cash G Dukes has a current medication list which includes the following long-term medication(s): amlodipine , amphetamine -dextroamphetamine , clonazepam , duloxetine , omeprazole , pravastatin , amphetamine -dextroamphetamine , amphetamine -dextroamphetamine , and enbrel sureclick.  Pharmacotherapy (Medications Ordered): Meds ordered this encounter  Medications   oxyCODONE -acetaminophen  (PERCOCET) 10-325 MG tablet    Sig: Take 1-2 tablets by mouth 2 (two) times daily as needed for pain (moderate pain).    Dispense:  120 tablet    Refill:  0   oxyCODONE -acetaminophen  (PERCOCET) 10-325 MG tablet    Sig: Take 1-2 tablets by mouth 2 (two) times daily as needed for pain (moderate pain).    Dispense:  120 tablet    Refill:  0   oxyCODONE -acetaminophen  (PERCOCET) 10-325 MG tablet    Sig: Take 1-2 tablets by mouth 2 (two) times daily as needed for pain (moderate pain).    Dispense:  120 tablet    Refill:  0   Orders:  No orders of the defined types were placed in this encounter.       Return in about 3 months (around 12/03/2023) for (F2F), (MM), Marthe Slain NP.    Recent Visits Date Type Provider Dept  06/30/23 Procedure visit Cephus Collin, MD [facility]  Showing recent visits within past 90 days and meeting all other requirements Today's Visits Date Type Provider Dept  09/02/23 Office Visit Carly Sabo K, NP [facility]  Showing today's visits and meeting all other requirements Future Appointments Date Type Provider Dept  11/26/23 Appointment Maansi Wike K, NP [facility]  Showing future appointments within next 90 days and meeting all other requirements  I discussed  the assessment and treatment plan with the patient. The patient was provided an opportunity to ask questions and all were answered. The patient agreed with  the plan and demonstrated an understanding of the instructions.  Patient advised to call back or seek an in-person evaluation if the symptoms or condition worsens.  Duration of encounter: 30 minutes.  Total time on encounter, as per AMA guidelines included both the face-to-face and non-face-to-face time personally spent by the physician and/or other qualified health care professional(s) on the day of the encounter (includes time in activities that require the physician or other qualified health care professional and does not include time in activities normally performed by clinical staff). Physician's time may include the following activities when performed: Preparing to see the patient (e.g., pre-charting review of records, searching for previously ordered imaging, lab work, and nerve conduction tests) Review of prior analgesic pharmacotherapies. Reviewing PMP Interpreting ordered tests (e.g., lab work, imaging, nerve conduction tests) Performing post-procedure evaluations, including interpretation of diagnostic procedures Obtaining and/or reviewing separately obtained history Performing a medically appropriate examination and/or evaluation Counseling and educating the patient/family/caregiver Ordering medications, tests, or procedures Referring and communicating with other health care professionals (when not separately reported) Documenting clinical information in the electronic or other health record Independently interpreting results (not separately reported) and communicating results to the patient/ family/caregiver Care coordination (not separately reported)  Note by: Tyree Vandruff K Leita Lindbloom, NP (TTS and AI technology used. I apologize for any typographical errors that were not detected and corrected.) Date: 09/02/2023; Time: 3:27 PM

## 2023-09-23 ENCOUNTER — Encounter: Admitting: Orthopedic Surgery

## 2023-10-03 ENCOUNTER — Other Ambulatory Visit: Payer: Self-pay | Admitting: Family Medicine

## 2023-10-06 ENCOUNTER — Encounter
Admission: RE | Admit: 2023-10-06 | Discharge: 2023-10-06 | Disposition: A | Discharge status: Home / Self Care | Source: Ambulatory Visit | Attending: Neurosurgery | Admitting: Neurosurgery

## 2023-10-06 ENCOUNTER — Other Ambulatory Visit: Payer: Self-pay

## 2023-10-06 VITALS — Ht 68.0 in | Wt 245.0 lb

## 2023-10-06 DIAGNOSIS — Z01818 Encounter for other preprocedural examination: Secondary | ICD-10-CM | POA: Insufficient documentation

## 2023-10-06 DIAGNOSIS — Z01810 Encounter for preprocedural cardiovascular examination: Secondary | ICD-10-CM

## 2023-10-06 DIAGNOSIS — Z01812 Encounter for preprocedural laboratory examination: Secondary | ICD-10-CM | POA: Diagnosis present

## 2023-10-06 DIAGNOSIS — I10 Essential (primary) hypertension: Secondary | ICD-10-CM

## 2023-10-06 DIAGNOSIS — I739 Peripheral vascular disease, unspecified: Secondary | ICD-10-CM

## 2023-10-06 HISTORY — DX: Depression, unspecified: F32.A

## 2023-10-06 LAB — TYPE AND SCREEN
ABO/RH(D): B POS
Antibody Screen: NEGATIVE

## 2023-10-06 LAB — BASIC METABOLIC PANEL WITH GFR
Anion gap: 9 (ref 5–15)
BUN: 14 mg/dL (ref 6–20)
CO2: 27 mmol/L (ref 22–32)
Calcium: 9 mg/dL (ref 8.9–10.3)
Chloride: 103 mmol/L (ref 98–111)
Creatinine, Ser: 0.8 mg/dL (ref 0.44–1.00)
GFR, Estimated: >60 mL/min (ref >60)
Glucose, Bld: 91 mg/dL (ref 70–99)
Potassium: 4.2 mmol/L (ref 3.5–5.1)
Sodium: 139 mmol/L (ref 135–145)

## 2023-10-06 LAB — CBC
HCT: 40.3 % (ref 36.0–46.0)
Hemoglobin: 13.3 g/dL (ref 12.0–15.0)
MCH: 27.9 pg (ref 26.0–34.0)
MCHC: 33 g/dL (ref 30.0–36.0)
MCV: 84.5 fL (ref 80.0–100.0)
Platelets: 252 K/uL (ref 150–400)
RBC: 4.77 MIL/uL (ref 3.87–5.11)
RDW: 13.6 % (ref 11.5–15.5)
WBC: 5.3 K/uL (ref 4.0–10.5)
nRBC: 0 % (ref 0.0–0.2)

## 2023-10-06 NOTE — Patient Instructions (Addendum)
 Your procedure is scheduled on:  MONDAY JULY 28  Report to the Registration Desk on the 1st floor of the [facility] To find out your arrival time, please call [phone] between 1PM - 3PM on:   FRIDAY JULY 25 If your arrival time is 6:00 am, do not arrive before that time as the [facility] entrance doors do not open until 6:00 am.  REMEMBER: Instructions that are not followed completely may result in serious medical risk, up to and including death; or upon the discretion of your surgeon and anesthesiologist your surgery may need to be rescheduled.  Do not eat food after midnight the night before surgery.  No gum chewing or hard candies.  You may however, drink CLEAR liquids up to 2 hours before you are scheduled to arrive for your surgery. Do not drink anything within 2 hours of your scheduled arrival time.  Clear liquids include: - water  - apple juice without pulp - gatorade (not RED colors) - black coffee or tea (Do NOT add milk or creamers to the coffee or tea) Do NOT drink anything that is not on this list.  One week prior to surgery:  MONDAY JULY 21  Stop Anti-inflammatories (NSAIDS) such as Advil , Aleve , Ibuprofen , Motrin , Naproxen , Naprosyn  and Aspirin  based products such as Excedrin, Goody's Powder, BC Powder. Stop ANY OVER THE COUNTER supplements until after surgery. Vitamin D , Ergocalciferol    You may however, continue to take Tylenol  if needed for pain up until the day of surgery. diclofenac  (VOLTAREN ) hold for 7 days prior to surgery, last dose SUNDAY JULY 20   Continue taking all of your other prescription medications up until the day of surgery.  ON THE DAY OF SURGERY ONLY TAKE THESE MEDICATIONS WITH SIPS OF WATER:  DULoxetine  (CYMBALTA )  omeprazole  (PRILOSEC)  amphetamine -dextroamphetamine  (ADDERALL XR)  oxyCODONE -acetaminophen  (PERCOCET)     No Alcohol for 24 hours before or after surgery.  Do not use any recreational drugs for at least a week  (preferably 2 weeks) before your surgery.  Please be advised that the combination of cocaine and anesthesia may have negative outcomes, up to and including death. If you test positive for cocaine, your surgery will be cancelled.  On the morning of surgery brush your teeth with toothpaste and water, you may rinse your mouth with mouthwash if you wish. Do not swallow any toothpaste or mouthwash.  Use CHG Soap as directed on instruction sheet.  Do not wear jewelry, make-up, hairpins, clips or nail polish.  For welded (permanent) jewelry: bracelets, anklets, waist bands, etc.  Please have this removed prior to surgery.  If it is not removed, there is a chance that hospital personnel will need to cut it off on the day of surgery.  Do not wear lotions, powders, or perfumes.   Do not shave body hair from the neck down 48 hours before surgery.  Contact lenses, hearing aids and dentures may not be worn into surgery.  Do not bring valuables to the hospital. [facility] is not responsible for any missing/lost belongings or valuables.   Notify your doctor if there is any change in your medical condition (cold, fever, infection).  Wear comfortable clothing (specific to your surgery type) to the hospital.  After surgery, you can help prevent lung complications by doing breathing exercises.   If you are being discharged the day of surgery, you will not be allowed to drive home. You will need a responsible individual to drive you home and stay  with you for 24 hours after surgery.   If you are taking public transportation, you will need to have a responsible individual with you.  Please call the Pre-admissions Testing Dept. at [phone] if you have any questions about these instructions.  Surgery Visitation Policy:  Patients having surgery or a procedure may have two visitors.  Children under the age of 59 must have an adult with them who is not the patient.   Merchandiser, retail  to address health-related social needs:  [email or web address]       Pre-operative 5 CHG Bath Instructions   You can play a key role in reducing the risk of infection after surgery. Your skin needs to be as free of germs as possible. You can reduce the number of germs on your skin by washing with CHG (chlorhexidine  gluconate) soap before surgery. CHG is an antiseptic soap that kills germs and continues to kill germs even after washing.   DO NOT use if you have an allergy to chlorhexidine /CHG or antibacterial soaps. If your skin becomes reddened or irritated, stop using the CHG and notify one of our RNs at [phone].   Please shower with the CHG soap starting 4 days before surgery using the following schedule:   STARTING THURSDAY JULY 24     Please keep in mind the following:  DO NOT shave, including legs and underarms, starting the day of your first shower.   You may shave your face at any point before/day of surgery.  Place clean sheets on your bed the day you start using CHG soap. Use a clean washcloth (not used since being washed) for each shower. DO NOT sleep with pets once you start using the CHG.   CHG Shower Instructions:  If you choose to wash your hair and private area, wash first with your normal shampoo/soap.  After you use shampoo/soap, rinse your hair and body thoroughly to remove shampoo/soap residue.  Turn the water OFF and apply about 3 tablespoons (45 ml) of CHG soap to a CLEAN washcloth.  Apply CHG soap ONLY FROM YOUR NECK DOWN TO YOUR TOES (washing for 3-5 minutes)  DO NOT use CHG soap on face, private areas, open wounds, or sores.  Pay special attention to the area where your surgery is being performed.  If you are having back surgery, having someone wash your back for you may be helpful. Wait 2 minutes after CHG soap is applied, then you may rinse off the CHG soap.  Pat dry with a clean towel  Put on clean clothes/pajamas   If you choose to wear  lotion, please use ONLY the CHG-compatible lotions on the back of this paper.     Additional instructions for the day of surgery: DO NOT APPLY any lotions, deodorants, cologne, or perfumes.   Put on clean/comfortable clothes.  Brush your teeth.  Ask your nurse before applying any prescription medications to the skin.      CHG Compatible Lotions   Aveeno Moisturizing lotion  Cetaphil Moisturizing Cream  Cetaphil Moisturizing Lotion  Clairol Herbal Essence Moisturizing Lotion, Dry Skin  Clairol Herbal Essence Moisturizing Lotion, Extra Dry Skin  Clairol Herbal Essence Moisturizing Lotion, Normal Skin  Curel Age Defying Therapeutic Moisturizing Lotion with Alpha Hydroxy  Curel Extreme Care Body Lotion  Curel Soothing Hands Moisturizing Hand Lotion  Curel Therapeutic Moisturizing Cream, Fragrance-Free  Curel Therapeutic Moisturizing Lotion, Fragrance-Free  Curel Therapeutic Moisturizing Lotion, Original Formula  Eucerin Daily Replenishing Lotion  Eucerin Dry Skin Therapy Plus  Alpha Hydroxy Crme  Eucerin Dry Skin Therapy Plus Alpha Hydroxy Lotion  Eucerin Original Crme  Eucerin Original Lotion  Eucerin Plus Crme Eucerin Plus Lotion  Eucerin TriLipid Replenishing Lotion  Keri Anti-Bacterial Hand Lotion  Keri Deep Conditioning Original Lotion Dry Skin Formula Softly Scented  Keri Deep Conditioning Original Lotion, Fragrance Free Sensitive Skin Formula  Keri Lotion Fast Absorbing Fragrance Free Sensitive Skin Formula  Keri Lotion Fast Absorbing Softly Scented Dry Skin Formula  Keri Original Lotion  Keri Skin Renewal Lotion Keri Silky Smooth Lotion  Keri Silky Smooth Sensitive Skin Lotion  Nivea Body Creamy Conditioning Oil  Nivea Body Extra Enriched Teacher, adult education Moisturizing Lotion Nivea Crme  Nivea Skin Firming Lotion  NutraDerm 30 Skin Lotion  NutraDerm Skin Lotion  NutraDerm Therapeutic Skin Cream  NutraDerm Therapeutic Skin  Lotion  ProShield Protective Hand Cream  Provon moisturizing lotion

## 2023-10-09 ENCOUNTER — Other Ambulatory Visit: Payer: Self-pay | Admitting: Nurse Practitioner

## 2023-10-09 ENCOUNTER — Encounter: Payer: Self-pay | Admitting: Family Medicine

## 2023-10-09 MED ORDER — DULOXETINE HCL 20 MG PO CPEP: ORAL_CAPSULE | ORAL | 2 refills | Status: DC

## 2023-10-13 ENCOUNTER — Ambulatory Visit

## 2023-10-13 ENCOUNTER — Other Ambulatory Visit: Payer: Self-pay

## 2023-10-13 ENCOUNTER — Encounter
Admission: RE | Disposition: A | Discharge status: Home / Self Care | Payer: Self-pay | Source: Ambulatory Visit | Attending: Neurosurgery

## 2023-10-13 ENCOUNTER — Ambulatory Visit
Admission: RE | Admit: 2023-10-13 | Discharge: 2023-10-13 | Disposition: A | Discharge status: Home / Self Care | Source: Ambulatory Visit | Attending: Neurosurgery | Admitting: Neurosurgery

## 2023-10-13 ENCOUNTER — Encounter: Payer: Self-pay | Admitting: Neurosurgery

## 2023-10-13 ENCOUNTER — Ambulatory Visit: Admitting: Anesthesiology

## 2023-10-13 ENCOUNTER — Ambulatory Visit: Payer: Self-pay | Admitting: Urgent Care

## 2023-10-13 DIAGNOSIS — K219 Gastro-esophageal reflux disease without esophagitis: Secondary | ICD-10-CM | POA: Diagnosis not present

## 2023-10-13 DIAGNOSIS — M1990 Unspecified osteoarthritis, unspecified site: Secondary | ICD-10-CM | POA: Diagnosis not present

## 2023-10-13 DIAGNOSIS — T85192D Other mechanical complication of implanted electronic neurostimulator (electrode) of spinal cord, subsequent encounter: Secondary | ICD-10-CM | POA: Diagnosis not present

## 2023-10-13 DIAGNOSIS — Y758 Miscellaneous neurological devices associated with adverse incidents, not elsewhere classified: Secondary | ICD-10-CM | POA: Insufficient documentation

## 2023-10-13 DIAGNOSIS — T85192S Other mechanical complication of implanted electronic neurostimulator (electrode) of spinal cord, sequela: Secondary | ICD-10-CM

## 2023-10-13 DIAGNOSIS — M79671 Pain in right foot: Secondary | ICD-10-CM

## 2023-10-13 DIAGNOSIS — I4891 Unspecified atrial fibrillation: Secondary | ICD-10-CM | POA: Diagnosis not present

## 2023-10-13 DIAGNOSIS — T85192A Other mechanical complication of implanted electronic neurostimulator (electrode) of spinal cord, initial encounter: Secondary | ICD-10-CM | POA: Insufficient documentation

## 2023-10-13 DIAGNOSIS — I7300 Raynaud's syndrome without gangrene: Secondary | ICD-10-CM | POA: Diagnosis not present

## 2023-10-13 DIAGNOSIS — E669 Obesity, unspecified: Secondary | ICD-10-CM | POA: Insufficient documentation

## 2023-10-13 DIAGNOSIS — I10 Essential (primary) hypertension: Secondary | ICD-10-CM | POA: Insufficient documentation

## 2023-10-13 DIAGNOSIS — I517 Cardiomegaly: Secondary | ICD-10-CM | POA: Diagnosis not present

## 2023-10-13 DIAGNOSIS — G8929 Other chronic pain: Secondary | ICD-10-CM | POA: Insufficient documentation

## 2023-10-13 DIAGNOSIS — Z87891 Personal history of nicotine dependence: Secondary | ICD-10-CM | POA: Diagnosis not present

## 2023-10-13 DIAGNOSIS — R918 Other nonspecific abnormal finding of lung field: Secondary | ICD-10-CM | POA: Diagnosis not present

## 2023-10-13 DIAGNOSIS — Z01818 Encounter for other preprocedural examination: Secondary | ICD-10-CM

## 2023-10-13 HISTORY — PX: REMOVAL, SPINAL CORD STIMULATOR, THORACIC: SHX7633

## 2023-10-13 LAB — PHOSPHORUS: Phosphorus: 3.8 mg/dL (ref 2.5–4.6)

## 2023-10-13 LAB — TSH: TSH: 3.597 u[IU]/mL (ref 0.350–4.500)

## 2023-10-13 LAB — MAGNESIUM: Magnesium: 2 mg/dL (ref 1.7–2.4)

## 2023-10-13 SURGERY — REMOVAL, SPINAL CORD STIMULATOR, THORACIC
Anesthesia: General | Site: Back

## 2023-10-13 MED ORDER — OXYCODONE HCL 5 MG PO TABS
ORAL_TABLET | ORAL | Status: AC
Start: 2023-10-13 — End: 2023-10-13
  Filled 2023-10-13: qty 10

## 2023-10-13 MED ORDER — FENTANYL CITRATE (PF) 100 MCG/2ML IJ SOLN
25.0000 ug | INTRAMUSCULAR | Status: DC | PRN
  Administered 2023-10-13 (×3): 50 ug via INTRAVENOUS

## 2023-10-13 MED ORDER — ROCURONIUM BROMIDE 10 MG/ML (PF) SYRINGE
PREFILLED_SYRINGE | INTRAVENOUS | Status: DC | PRN
  Administered 2023-10-13: 30 mg via INTRAVENOUS

## 2023-10-13 MED ORDER — METOPROLOL TARTRATE 25 MG PO TABS
12.5000 mg | ORAL_TABLET | Freq: Two times a day (BID) | ORAL | 0 refills | Status: AC | PRN
  Filled 2023-10-13: qty 60, 60d supply, fill #0

## 2023-10-13 MED ORDER — SUCCINYLCHOLINE CHLORIDE 200 MG/10ML IV SOSY
PREFILLED_SYRINGE | INTRAVENOUS | Status: DC | PRN
  Administered 2023-10-13: 100 mg via INTRAVENOUS

## 2023-10-13 MED ORDER — ACETAMINOPHEN 10 MG/ML IV SOLN: 1000.0000 mg | Freq: Once | INTRAVENOUS | Status: DC | PRN

## 2023-10-13 MED ORDER — SURGIFLO WITH THROMBIN (HEMOSTATIC MATRIX KIT) OPTIME: TOPICAL | Status: DC | PRN

## 2023-10-13 MED ORDER — METOPROLOL TARTRATE 12.5 MG HALF TABLET
12.5000 mg | ORAL_TABLET | Freq: Once | ORAL | Status: AC
  Administered 2023-10-13: 12.5 mg via ORAL

## 2023-10-13 MED ORDER — PROPOFOL 1000 MG/100ML IV EMUL
INTRAVENOUS | Status: AC
  Filled 2023-10-13: qty 100

## 2023-10-13 MED ORDER — METOPROLOL TARTRATE 25 MG PO TABS
ORAL_TABLET | ORAL | Status: AC
  Filled 2023-10-13: qty 1

## 2023-10-13 MED ORDER — CEFAZOLIN SODIUM-DEXTROSE 2-4 GM/100ML-% IV SOLN
INTRAVENOUS | Status: AC
Start: 2023-10-13 — End: 2023-10-13
  Filled 2023-10-13: qty 100

## 2023-10-13 MED ORDER — FENTANYL CITRATE (PF) 100 MCG/2ML IJ SOLN
INTRAMUSCULAR | Status: AC
Start: 2023-10-13 — End: 2023-10-13
  Filled 2023-10-13: qty 2

## 2023-10-13 MED ORDER — ROCURONIUM BROMIDE 10 MG/ML (PF) SYRINGE
PREFILLED_SYRINGE | INTRAVENOUS | Status: AC
  Filled 2023-10-13: qty 10

## 2023-10-13 MED ORDER — SUCCINYLCHOLINE CHLORIDE 200 MG/10ML IV SOSY
PREFILLED_SYRINGE | INTRAVENOUS | Status: AC
  Filled 2023-10-13: qty 10

## 2023-10-13 MED ORDER — ONDANSETRON HCL 4 MG/2ML IJ SOLN
INTRAMUSCULAR | Status: AC
  Filled 2023-10-13: qty 2

## 2023-10-13 MED ORDER — SUGAMMADEX SODIUM 200 MG/2ML IV SOLN
INTRAVENOUS | Status: DC | PRN
  Administered 2023-10-13: 222.2 mg via INTRAVENOUS

## 2023-10-13 MED ORDER — ORAL CARE MOUTH RINSE: 15.0000 mL | Freq: Once | OROMUCOSAL | Status: AC

## 2023-10-13 MED ORDER — EPHEDRINE SULFATE-NACL 50-0.9 MG/10ML-% IV SOSY
PREFILLED_SYRINGE | INTRAVENOUS | Status: DC | PRN
  Administered 2023-10-13: 10 mg via INTRAVENOUS

## 2023-10-13 MED ORDER — OXYCODONE HCL 5 MG PO TABS
10.0000 mg | ORAL_TABLET | Freq: Once | ORAL | Status: AC
  Administered 2023-10-13: 10 mg via ORAL

## 2023-10-13 MED ORDER — LACTATED RINGERS IV SOLN: INTRAVENOUS | Status: DC

## 2023-10-13 MED ORDER — CEFAZOLIN IN SODIUM CHLORIDE 2-0.9 GM/100ML-% IV SOLN
2.0000 g | Freq: Once | INTRAVENOUS | Status: AC
  Administered 2023-10-13: 2 g via INTRAVENOUS

## 2023-10-13 MED ORDER — BUPIVACAINE-EPINEPHRINE (PF) 0.5% -1:200000 IJ SOLN
INTRAMUSCULAR | Status: DC | PRN
Start: 2023-10-13 — End: 2023-10-13
  Administered 2023-10-13: 10 mL

## 2023-10-13 MED ORDER — FENTANYL CITRATE (PF) 100 MCG/2ML IJ SOLN
INTRAMUSCULAR | Status: AC
  Filled 2023-10-13: qty 2

## 2023-10-13 MED ORDER — MIDAZOLAM HCL 2 MG/2ML IJ SOLN
INTRAMUSCULAR | Status: AC
  Filled 2023-10-13: qty 2

## 2023-10-13 MED ORDER — OXYCODONE HCL 5 MG PO TABS
5.0000 mg | ORAL_TABLET | Freq: Once | ORAL | Status: AC | PRN
  Administered 2023-10-13: 5 mg via ORAL

## 2023-10-13 MED ORDER — DEXAMETHASONE SODIUM PHOSPHATE 10 MG/ML IJ SOLN
INTRAMUSCULAR | Status: DC | PRN
  Administered 2023-10-13: 10 mg via INTRAVENOUS

## 2023-10-13 MED ORDER — FENTANYL CITRATE (PF) 100 MCG/2ML IJ SOLN
INTRAMUSCULAR | Status: DC | PRN
  Administered 2023-10-13: 100 ug via INTRAVENOUS

## 2023-10-13 MED ORDER — LIDOCAINE HCL (CARDIAC) PF 100 MG/5ML IV SOSY
PREFILLED_SYRINGE | INTRAVENOUS | Status: DC | PRN
  Administered 2023-10-13: 100 mg via INTRAVENOUS

## 2023-10-13 MED ORDER — 0.9 % SODIUM CHLORIDE (POUR BTL) OPTIME
TOPICAL | Status: DC | PRN
  Administered 2023-10-13: 500 mL

## 2023-10-13 MED ORDER — OXYCODONE HCL 5 MG/5ML PO SOLN: 5.0000 mg | Freq: Once | ORAL | Status: AC | PRN

## 2023-10-13 MED ORDER — PROPOFOL 10 MG/ML IV BOLUS
INTRAVENOUS | Status: DC | PRN
  Administered 2023-10-13: 200 mg via INTRAVENOUS

## 2023-10-13 MED ORDER — LIDOCAINE HCL (PF) 2 % IJ SOLN
INTRAMUSCULAR | Status: AC
Start: 2023-10-13 — End: 2023-10-13
  Filled 2023-10-13: qty 5

## 2023-10-13 MED ORDER — CHLORHEXIDINE GLUCONATE 0.12 % MT SOLN
OROMUCOSAL | Status: AC
Start: 2023-10-13 — End: 2023-10-13
  Filled 2023-10-13: qty 15

## 2023-10-13 MED ORDER — OXYCODONE HCL 5 MG PO TABS
5.0000 mg | ORAL_TABLET | ORAL | 0 refills | Status: AC | PRN
  Filled 2023-10-13: qty 20, 4d supply, fill #0

## 2023-10-13 MED ORDER — PROPOFOL 10 MG/ML IV BOLUS
INTRAVENOUS | Status: AC
  Filled 2023-10-13: qty 20

## 2023-10-13 MED ORDER — ONDANSETRON HCL 4 MG/2ML IJ SOLN
INTRAMUSCULAR | Status: DC | PRN
  Administered 2023-10-13: 4 mg via INTRAVENOUS

## 2023-10-13 MED ORDER — MIDAZOLAM HCL 2 MG/2ML IJ SOLN
INTRAMUSCULAR | Status: DC | PRN
  Administered 2023-10-13: 2 mg via INTRAVENOUS

## 2023-10-13 MED ORDER — DEXAMETHASONE SODIUM PHOSPHATE 10 MG/ML IJ SOLN
INTRAMUSCULAR | Status: AC
Start: 2023-10-13 — End: 2023-10-13
  Filled 2023-10-13: qty 1

## 2023-10-13 MED ORDER — CHLORHEXIDINE GLUCONATE 0.12 % MT SOLN
15.0000 mL | Freq: Once | OROMUCOSAL | Status: AC
  Administered 2023-10-13: 15 mL via OROMUCOSAL

## 2023-10-13 MED ORDER — IRRISEPT - 450ML BOTTLE WITH 0.05% CHG IN STERILE WATER, USP 99.95% OPTIME
TOPICAL | Status: DC | PRN
  Administered 2023-10-13: 450 mL via TOPICAL

## 2023-10-13 MED ORDER — ONDANSETRON HCL 4 MG/2ML IJ SOLN: 4.0000 mg | Freq: Once | INTRAMUSCULAR | Status: DC | PRN

## 2023-10-13 MED ORDER — OXYCODONE HCL 5 MG PO TABS
ORAL_TABLET | ORAL | Status: AC
Start: 2023-10-13 — End: 2023-10-13
  Filled 2023-10-13: qty 1

## 2023-10-13 SURGICAL SUPPLY — 30 items
BUR NEURO DRILL SOFT 3.0X3.8M (BURR) ×1 IMPLANT
DERMABOND ADVANCED .7 DNX12 (GAUZE/BANDAGES/DRESSINGS) ×2 IMPLANT
DRAPE C ARM PK CFD 31 SPINE (DRAPES) ×1 IMPLANT
DRAPE LAPAROTOMY 100X77 ABD (DRAPES) ×1 IMPLANT
DRSG OPSITE POSTOP 4X6 (GAUZE/BANDAGES/DRESSINGS) IMPLANT
DRSG OPSITE POSTOP 4X8 (GAUZE/BANDAGES/DRESSINGS) IMPLANT
ELECTRODE REM PT RTRN 9FT ADLT (ELECTROSURGICAL) ×1 IMPLANT
FEE INTRAOP CADWELL SUPPLY NCS (MISCELLANEOUS) IMPLANT
FEE INTRAOP MONITOR IMPULS NCS (MISCELLANEOUS) IMPLANT
GLOVE BIOGEL PI IND STRL 6.5 (GLOVE) ×1 IMPLANT
GLOVE SURG SYN 6.5 PF PI (GLOVE) ×1 IMPLANT
GLOVE SURG SYN 8.5 PF PI (GLOVE) ×3 IMPLANT
GOWN SRG LRG LVL 4 IMPRV REINF (GOWNS) ×1 IMPLANT
GOWN SRG XL LVL 3 NONREINFORCE (GOWNS) ×1 IMPLANT
KIT SPINAL PRONEVIEW (KITS) ×1 IMPLANT
LAVAGE JET IRRISEPT WOUND (IRRIGATION / IRRIGATOR) ×1 IMPLANT
MANIFOLD NEPTUNE II (INSTRUMENTS) ×1 IMPLANT
MARKER SKIN DUAL TIP RULER LAB (MISCELLANEOUS) ×1 IMPLANT
NDL SAFETY ECLIPSE 18X1.5 (NEEDLE) ×1 IMPLANT
NS IRRIG 500ML POUR BTL (IV SOLUTION) ×1 IMPLANT
PACK LAMINECTOMY ARMC (PACKS) ×1 IMPLANT
STAPLER SKIN PROX 35W (STAPLE) ×1 IMPLANT
SURGIFLO W/THROMBIN 8M KIT (HEMOSTASIS) ×1 IMPLANT
SUT SILK 2 0SH CR/8 30 (SUTURE) ×1 IMPLANT
SUT STRATA 3-0 15 PS-2 (SUTURE) ×2 IMPLANT
SUT VIC AB 0 CT1 18XCR BRD 8 (SUTURE) ×1 IMPLANT
SUT VIC AB 2-0 CT1 18 (SUTURE) ×1 IMPLANT
SYR 10ML LL (SYRINGE) IMPLANT
SYR 30ML LL (SYRINGE) ×2 IMPLANT
TRAP FLUID SMOKE EVACUATOR (MISCELLANEOUS) ×1 IMPLANT

## 2023-10-13 NOTE — Discharge Summary (Signed)
 Discharge Summary  Patient ID: CASEE KNEPP MRN: [id number] DOB/AGE: [birth year] 58 y.o.  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 Her intraoperative course was uncomplicated. She was monitored in PACU and discharged home after ambulating, urinating, and tolerating PO intake. She was given a prescription for Oxycodone  to take for pain refractory to home Percocet.  Consults: None  Significant Diagnostic Studies: NA  Treatments: surgery: as above. Please see separately dictated operative report for further details  Discharge Exam: Blood pressure 122/68, pulse 67, temperature (!) 97.4 F (36.3 C), resp. rate 15, last menstrual period 03/19/2003, SpO2 92%. A&O MAEW  Disposition: Discharge disposition: 01-Home or Self Care       Discharge Instructions     Remove dressing in 48 hours   Complete by: As directed       Allergies as of 10/13/2023       Reactions   Wellbutrin [bupropion]    Anger issues   Crestor  [rosuvastatin ]    Felt bad   Lyrica  [pregabalin ] Swelling   Leg swelling        Medication List     TAKE these medications    amLODipine  2.5 MG tablet Commonly known as: NORVASC  Take 1 tablet (2.5 mg total) by mouth daily.   amphetamine -dextroamphetamine  30 MG 24 hr capsule Commonly known as: ADDERALL XR 2 every morning as directed   amphetamine -dextroamphetamine  30 MG 24 hr capsule Commonly known as: ADDERALL XR TAKE (2) CAPSULES BY MOUTH EVERY MORNING. MUST LAST 30 DAYS.   amphetamine -dextroamphetamine  30 MG 24 hr capsule Commonly known as: ADDERALL XR 2 every morning as directed   clonazePAM  0.5 MG  tablet Commonly known as: KLONOPIN  Take 1 tablet (0.5 mg total) by mouth 2 (two) times daily as needed for anxiety.   diclofenac  75 MG EC tablet Commonly known as: VOLTAREN  Take 1 tablet (75 mg total) by mouth 2 (two) times daily.   DULoxetine  20 MG capsule Commonly known as: CYMBALTA  Take 2 tabs (40 mg total) po once a day   lidocaine  2 % solution Commonly known as: XYLOCAINE  TAKE 5 ML TWICE DAILY AS NEEDED FOR CANKER SORES.   lidocaine  5 % ointment Commonly known as: XYLOCAINE  APPLY TO AFFECTED AREA TWICE DAILY. NO MORE THAN 5 DAYS AT A TIME.   methocarbamol  750 MG tablet Commonly known as: ROBAXIN  Take 1 tablet (750 mg total) by mouth every 8 (eight) hours as needed for muscle spasms.   nystatin  powder Commonly known as: MYCOSTATIN /NYSTOP  APPLY AS DIRECTED TO AFFECTED AREAS SEVERAL TIMES PER DAY. MAX FOUR TIMES.   omeprazole  40 MG capsule Commonly known as: PRILOSEC Take 1 capsule (40 mg total) by mouth daily.   oxyCODONE  5 MG immediate release tablet Commonly known as: Roxicodone  Take 1 tablet (5 mg total) by mouth every 4 (four) hours as needed for up to 3 days for breakthrough pain (refractory to home Percocet).   oxyCODONE -acetaminophen  10-325 MG tablet Commonly known as: PERCOCET Take 1-2 tablets by mouth 2 (two) times daily as needed for pain (moderate pain).   oxyCODONE -acetaminophen  10-325 MG tablet Commonly known as: PERCOCET Take 1-2 tablets by mouth 2 (two) times daily as needed for pain (moderate pain). Start  taking on: November 01, 2023   pravastatin  20 MG tablet Commonly known as: Pravachol  One on Mon Wed and Friday   silver  sulfADIAZINE  1 % cream Commonly known as: SSD APPLY TO WOUNDS ON FEET TWICE DAILY.   traZODone  100 MG tablet Commonly known as: DESYREL  Take 100-200 mg by mouth at bedtime as needed.   Vitamin D  (Ergocalciferol ) 1.25 MG (50000 UNIT) Caps capsule Commonly known as: DRISDOL  TAKE 1 CAPSULE ONCE EVERY 7 DAYS.          Signed: Edsel Jama Goods 10/13/2023, 12:15 PM

## 2023-10-13 NOTE — Anesthesia Preprocedure Evaluation (Addendum)
 Anesthesia Evaluation  Patient identified by MRN, date of birth, ID band Patient awake    Reviewed: Allergy & Precautions, NPO status , Patient's Chart, lab work & pertinent test results  History of Anesthesia Complications Negative for: history of anesthetic complications  Airway Mallampati: III   Neck ROM: Full    Dental  (+) Missing, Chipped   Pulmonary former smoker (quit 2002)   Pulmonary exam normal breath sounds clear to auscultation       Cardiovascular hypertension, Normal cardiovascular exam Rhythm:Regular Rate:Normal  ECG 10/06/23: normal   Neuro/Psych  Headaches PSYCHIATRIC DISORDERS (ADHD)  Depression    Chronic pain    GI/Hepatic ,GERD  ,,  Endo/Other  Obesity   Renal/GU negative Renal ROS     Musculoskeletal  (+) Arthritis ,  Raynaud   Abdominal   Peds  Hematology negative hematology ROS (+)   Anesthesia Other Findings   Reproductive/Obstetrics                              Anesthesia Physical Anesthesia Plan  ASA: 2  Anesthesia Plan: General   Post-op Pain Management:    Induction: Intravenous  PONV Risk Score and Plan: 3 and Ondansetron , Dexamethasone  and Treatment may vary due to age or medical condition  Airway Management Planned: Oral ETT  Additional Equipment:   Intra-op Plan:   Post-operative Plan: Extubation in OR  Informed Consent: I have reviewed the patients History and Physical, chart, labs and discussed the procedure including the risks, benefits and alternatives for the proposed anesthesia with the patient or authorized representative who has indicated his/her understanding and acceptance.     Dental advisory given  Plan Discussed with: CRNA  Anesthesia Plan Comments: (Patient consented for risks of anesthesia including but not limited to:  - adverse reactions to medications - damage to eyes, teeth, lips or other oral mucosa - nerve  damage due to positioning  - sore throat or hoarseness - damage to heart, brain, nerves, lungs, other parts of body or loss of life  Informed patient about role of CRNA in peri- and intra-operative care.  Patient voiced understanding.)         Anesthesia Quick Evaluation

## 2023-10-13 NOTE — Discharge Instructions (Addendum)
 NEUROSURGERY DISCHARGE INSTRUCTIONS  Admission diagnosis: Failure of spinal cord stimulator, sequela [T85.192S] Right foot pain [M79.671]  Operative procedure: Removal of spinal cord stimulator   What to do after you leave the hospital:  Recommended diet: regular diet. Increase protein intake to promote wound healing.  Recommended activity: activity as tolerated. You should walk multiple times per day  Special Instructions  No straining, no heavy lifting > 10lbs x 4 weeks.  Keep incision area clean and dry. May shower in 2 days. No baths or pools for 6 weeks.  Please remove dressing tomorrow, no need to apply a bandage afterwards  You have no sutures to remove, the skin is closed with adhesive  Please take pain medications as directed. Take a stool softener if on pain medications  *Regarding compression stockings-  Please wear day and night until you are walking a couple hundred feet three times a day.   Please Report any of the following: Nausea or Vomiting, Temperature is greater than 101.66F (38.1C) degrees, Dizziness, Abdominal Pain, Difficulty Breathing or Shortness of Breath, Inability to Eat, drink Fluids, or Take medications, Bleeding, swelling, or drainage from surgical incision sites, New numbness or weakness, and Bowel or bladder dysfunction to the neurosurgeon on call. How to contact us :  If you have any questions/concerns before or after surgery, you can reach us  at [phone], or you can send a mychart message. We can be reached by phone or mychart 8am-4pm, Monday-Friday.  *Please note: Calls after 4pm are forwarded to a third party answering service. Mychart messages are not routinely monitored during evenings, weekends, and holidays. Please call our office to contact the answering service for urgent concerns during non-business hours.   Additional Follow up appointments Please follow up with Lyle Decamp as scheduled in 2-3 weeks   Please see below for scheduled  appointments:  Future Appointments  Date Time Provider Department Center  10/27/2023  2:00 PM Decamp Lyle, PA-C [facility] None  11/10/2023  1:30 PM Alphonsa Glendia LABOR, MD [facility]  11/25/2023  3:30 PM Clois Fret, MD [facility] None  11/26/2023  2:00 PM Tobie Emmy POUR, NP [facility] None  12/31/2023  3:00 PM Decamp Lyle, PA-C [facility] None

## 2023-10-13 NOTE — Progress Notes (Signed)
 Dr. Laurita notified of continued A.Fib with HR in 80-90s. Per Dr. Laurita, patient's labs and xray results okay and patient may discharge home. Patient and patient's family instructed that patient is to follow up with primary care physician and request referral to cardiologist. Dr. Clois aware and in agreement with above plan.

## 2023-10-13 NOTE — H&P (Signed)
 Referring Physician:  No referring provider defined for this encounter.  Primary Physician:  Alphonsa Glendia LABOR, MD  History of Present Illness: 10/13/2023 Lauren Greene presents today with nonfunctional stimulator.    07/29/2023 Lauren Greene is here today with a chief complaint of nonfunctional spinal cord stimulator.  She had a spinal cord stimulator placed 2 years ago.  It has never worked significantly for her.  She would like it removed.  She is also having right foot pain. Bowel/Bladder Dysfunction: none  Conservative measures:  Physical therapy: has not participated in PT  Multimodal medical therapy including regular antiinflammatories: Cymbalta , Gabapentin , Oxycodone , Robaxin   Injections: 06/30/2023  L3, L4, and L5 Medial Branch Level(s).   Past Surgery: 02/26/2021 SCS placement Surgeon: Elspeth Ahle, MD  The symptoms are causing a significant impact on the patient's life.   I have utilized the care everywhere function in epic to review the outside records available from external health systems.  Review of Systems:  A 10 point review of systems is negative, except for the pertinent positives and negatives detailed in the HPI.  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   Hypertension    Localized osteoarthritis of shoulder regions, bilateral 04/24/2021   Lumbar facet arthropathy 10/22/2017   Lumbar radiculopathy 08/23/2019   Lumbar spondylosis 10/22/2017   Migraine    Osteoarthritis    Overactive bladder  04/06/2018   PAD (peripheral artery disease) (HCC) 12/04/2021   Periprosthetic fracture around internal prosthetic left knee joint 08/27/2012   Primary osteoarthritis of right ankle 11/23/2019   PVC (premature ventricular contraction)    Raynaud disease    Raynaud phenomenon 04/06/2018   [facility] podiatry diagnosed 2020   Spinal stenosis, 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 RXd Provera  and repeat Bx 76mo. 02-12-06 Bx benign--Dr. Elvie Pinal   FEMUR IM NAIL Left 08/27/2012   Procedure: INTRAMEDULLARY (IM) RETROGRADE FEMORAL NAILING;  Surgeon: Lonni CINDERELLA Poli, MD;  Location: [facility] OR;  Service: Orthopedics;  Laterality: Left;   FOOT ARTHRODESIS Right 02/01/2020   Procedure: TRIPLE ARTHRODESIS,  TENDO ACHILLES LENGTHENING;  Surgeon: Elsa Lonni SAUNDERS, MD;  Location: [facility];  Service: Orthopedics;  Laterality: Right;  LENGTH OF SURGERY: 4 HOURS   HAMMER TOE SURGERY Right    HEEL SPUR SURGERY     IM NAILING FEMORAL SHAFT FRACTURE Left 08/27/2012   Dr POLI   KNEE SURGERY  multiple   OSTEOTOMY     SHOULDER SURGERY Left    SPINAL CORD STIMULATOR INSERTION N/A 02/26/2021   Procedure: THORACIC SPINAL CORD STIMULATOR PLACEMENT, PULSE GENERATOR;  Surgeon: Bluford Standing, MD;  Location: [facility] ORS;  Service: Neurosurgery;  Laterality: N/A;   TOTAL KNEE ARTHROPLASTY Bilateral    2009 and 2012   TUBAL LIGATION     VAGINAL HYSTERECTOMY  2018     Allergies: Allergies as of 07/29/2023 - Review Complete 07/29/2023  Allergen Reaction Noted   Wellbutrin [bupropion]  06/21/2015   Crestor  [rosuvastatin ]  09/27/2022   Lyrica  [pregabalin ] Swelling 08/03/2014    Medications:  Current Facility-Administered Medications:    ceFAZolin  (ANCEF ) IVPB 2g/100 mL premix, 2 g, Intravenous, Once, Clois Fret, MD   IRRISEPT - 0.05% chlorhexedine in sterile water  for irrigation, , , PRN, Clois Fret, MD, 450 mL at 10/13/23 9170   lactated ringers  infusion, , Intravenous, Continuous, Piscitello, Fairy POUR, MD   Surgiflo with Thrombin  (Hemostatic Matrix Kit) Orlan, , , PRN, Clois Fret, MD, 1 Application at 10/13/23 9170  Social History: Social History   Tobacco Use   Smoking status: Former    Current packs/day: 0.00    Average packs/day: 2.0 packs/day for 10.0 years (20.0 ttl pk-yrs)    Types: Cigarettes    Start date: 08/17/1990    Quit date: 08/16/2000    Years since quitting: 23.1   Smokeless tobacco: Never  Vaping Use   Vaping status: Never Used  Substance Use Topics   Alcohol use: Yes    Comment: rarely   Drug use: No    Family Medical History: Family History  Problem Relation Age of Onset   Cancer Mother    Breast cancer Mother    Bipolar disorder Mother    Heart failure Father    Cancer Father    Heart disease Father    Bipolar disorder Sister    Cancer Other    Heart failure Other     Physical Examination: Vitals:   10/13/23 0846  BP: (!) 147/78  Pulse: 60  Resp: 18  Temp: (!) 97.2 F (36.2 C)  SpO2: 96%   Heart sounds normal no MRG. Chest Clear to Auscultation Bilaterally.  General: Patient is in no apparent distress. Attention to examination is appropriate.  Neck:   Supple.  Full range of motion.  Respiratory: Patient is breathing without any difficulty.   NEUROLOGICAL:     Awake, alert, oriented to person, place, and time.  Speech is clear and fluent.   Cranial Nerves: Pupils  equal round and reactive to light.  Facial tone is symmetric.  Facial sensation is symmetric. Shoulder shrug is symmetric. Tongue protrusion is midline.   Strength: MAEW    Medical Decision Making  Imaging: Xrays reviews  I have personally reviewed the images and agree with the above interpretation.  Assessment and Plan: Lauren Greene is a pleasant 58 y.o. female with prior placement of a percutaneous spinal cord stimulator.  It is nonfunctional.  We will remove her stimulator today.      Arlene Genova K. Clois MD, [facility] Neurosurgery

## 2023-10-13 NOTE — Op Note (Signed)
 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 Due to nonfunctional device, she asked for removal.    Findings: successful removal of a percutaneous spinal cord stimulator.   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     Preoperative Note:    Risks of surgery discussed in clinic.   Operative Note:      The patient was then brought from the preoperative center with intravenous access established.  The patient underwent general anesthesia and endotracheal tube intubation, then was rotated on the [facility] table where all pressure points were appropriately padded.  The prior incisions were marked.  The skin was then thoroughly cleansed.  Perioperative antibiotic prophylaxis was administered.  Sterile prep and drapes were then applied and a timeout was then observed.     The incision over the generator was opened and the IPG was exposed. It was circumferentially dissected and removed from the pocket.  The leads were divided and then dissected free from the pocket.  The midline incision was opened and the securing devices were identified. The stay sutures were divided.  The leads were then removed from the spinal canal.  The contacts were counted twice and noted to be fully present (16 total). The incisions were then irrigated and closed in layers.  A sterile dressing was applied.   Monitoring was stable throughout.   Patient was then rotated back to the preoperative bed awakened from anesthesia and taken to recovery. All counts are correct in this case.   I performed the entire procedure with the assistance of Edsel Goods PA as an Designer, television/film set.  An assistant was required for this procedure due to the complexity.  The assistant provided assistance in tissue manipulation and suction, and was required for the successful and safe performance of the procedure. I performed the critical portions of the procedure.      Reeves Daisy MD

## 2023-10-13 NOTE — Anesthesia Procedure Notes (Signed)
 Procedure Name: Intubation Date/Time: 10/13/2023 9:55 AM  Performed by: Elly Pfeiffer, CRNAPre-anesthesia Checklist: Patient identified, Emergency Drugs available, Suction available and Patient being monitored Patient Re-evaluated:Patient Re-evaluated prior to induction Oxygen Delivery Method: Circle system utilized Preoxygenation: Pre-oxygenation with 100% oxygen Induction Type: IV induction Ventilation: Mask ventilation without difficulty Laryngoscope Size: McGrath and 4 Grade View: Grade II Tube type: Oral Tube size: 7.5 mm Number of attempts: 1 Airway Equipment and Method: Stylet and Oral airway Placement Confirmation: ETT inserted through vocal cords under direct vision, positive ETCO2 and breath sounds checked- equal and bilateral Secured at: 22 cm Tube secured with: Tape Dental Injury: Teeth and Oropharynx as per pre-operative assessment  Comments: Cords clear; no trauma. Ca

## 2023-10-13 NOTE — Transfer of Care (Signed)
 Immediate Anesthesia Transfer of Care Note  Patient: [name]  Procedure(s) Performed: REMOVAL, SPINAL CORD STIMULATOR, THORACIC (Back)  Patient Location: PACU  Anesthesia Type:General  Level of Consciousness: awake, alert , and oriented  Airway & Oxygen Therapy: Patient Spontanous Breathing and Patient connected to face mask oxygen  Post-op Assessment: Report given to RN and Post -op Vital signs reviewed and stable  Post vital signs: stable  Last Vitals:  Vitals Value Taken Time  BP 123/68 10/13/23 10:46  Temp    Pulse 57 10/13/23 10:47  Resp 16 10/13/23 10:47  SpO2 96 % 10/13/23 10:47  Vitals shown include unfiled device data.  Last Pain:  Vitals:   10/13/23 0846  TempSrc: Temporal  PainSc: 6          Complications: No notable events documented.

## 2023-10-13 NOTE — Consult Note (Addendum)
 Initial Consultation Note   Patient: Lauren Greene FMW:[id number] DOB: [birth year] PCP: Alphonsa Glendia LABOR, MD DOA: 10/13/2023 DOS: the patient was seen and examined on 10/13/2023 Primary service: Clois Fret, MD  Referring physician: Dr. Dolan Reason for consult: afib  Assessment/Plan:  PAF - Likely secondary to surgical related stress and poorly controlled pain afterwards. - CHADS2=1, indication for aspirin  - Patient is asymptomatic and no symptoms or signs of CHF, I gave patient 1 dose of 12.5 mg metoprolol , if heart rate controlled and no bradycardia, likely patient can go home with as needed metoprolol  for rate control.  She told me that she does wear a Apple Watch and has never received any alarming from the Apple Watch for irregular heartbeats. -Check magnesium and phosphorus level and TSH, which results can be follow-up as outpatient. - Recommend outpatient follow-up with cardiology for long-term monitoring and echocardiogram outpatient.      TRH will sign off at present, please call us  again when needed.  HPI: Lauren Greene is a 58 y.o. female with past medical history of ankylosing spondylitis chronic back pain on spinal stimulator presented with elective surgery to remove nonfunctional cervical cord stimulator.  Patient tolerated procedure well.  However after procedure patient started develop A-fib on monitor patient however denied any palpitations lightheadedness nauseous vomiting.  She told me that she wears an Apple Watch daily and was never alarmed about any irregular heartbeats before.  Review of Systems: As mentioned in the history of present illness. All other systems reviewed and are negative. 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   Hypertension    Localized osteoarthritis of shoulder regions, bilateral 04/24/2021   Lumbar facet arthropathy 10/22/2017   Lumbar radiculopathy 08/23/2019   Lumbar spondylosis 10/22/2017   Migraine    Osteoarthritis    Overactive bladder 04/06/2018   PAD (peripheral artery disease) (HCC) 12/04/2021   Periprosthetic fracture around internal prosthetic left knee joint 08/27/2012   Primary osteoarthritis of right ankle 11/23/2019   PVC (premature ventricular contraction)    Raynaud disease    Raynaud phenomenon 04/06/2018   [facility] podiatry diagnosed 2020   Spinal stenosis, lumbar region with neurogenic claudication 07/17/2021   Past Surgical History:  Procedure Laterality Date   ACHILLES TENDON LENGTHENING Right 02/01/2020   Procedure: ACHILLES TENDON LENGTHENING;  Surgeon: Elsa Lonni SAUNDERS, MD;  Location: [facility];  Service: Orthopedics;  Laterality: Right;   ARTHRODESIS Right    BACK SURGERY     BLADDER SURGERY     BUNIONECTOMY     CERVICAL BIOPSY  W/ LOOP ELECTRODE EXCISION  1990's   in [place], [place]   ENDOMETRIAL BIOPSY  04-05-05 and 02-12-06   04-05-05 benign but too rapid growth. RXd Provera  and repeat Bx 87mo. 02-12-06 Bx benign--Dr. Elvie Pinal   FEMUR IM NAIL Left 08/27/2012   Procedure: INTRAMEDULLARY (IM) RETROGRADE FEMORAL NAILING;  Surgeon: Lonni CINDERELLA Poli, MD;  Location: [facility] OR;  Service: Orthopedics;  Laterality: Left;   FOOT ARTHRODESIS Right 02/01/2020  Procedure: TRIPLE ARTHRODESIS,  TENDO ACHILLES LENGTHENING;  Surgeon: Elsa Lonni SAUNDERS, MD;  Location: [facility];  Service: Orthopedics;  Laterality: Right;  LENGTH OF SURGERY: 4 HOURS   HAMMER TOE SURGERY Right    HEEL SPUR SURGERY     IM NAILING  FEMORAL SHAFT FRACTURE Left 08/27/2012   Dr VERNETTA   KNEE SURGERY     multiple   OSTEOTOMY     SHOULDER SURGERY Left    SPINAL CORD STIMULATOR INSERTION N/A 02/26/2021   Procedure: THORACIC SPINAL CORD STIMULATOR PLACEMENT, PULSE GENERATOR;  Surgeon: Bluford Standing, MD;  Location: [facility] ORS;  Service: Neurosurgery;  Laterality: N/A;   TOTAL KNEE ARTHROPLASTY Bilateral    2009 and 2012   TUBAL LIGATION     VAGINAL HYSTERECTOMY  2018   Social History:  reports that she quit smoking about 23 years ago. Her smoking use included cigarettes. She started smoking about 33 years ago. She has a 20 pack-year smoking history. She has never used smokeless tobacco. She reports current alcohol use. She reports that she does not use drugs.  Allergies  Allergen Reactions   Wellbutrin [Bupropion]     Anger issues   Crestor  [Rosuvastatin ]     Felt bad   Lyrica  [Pregabalin ] Swelling    Leg swelling    Family History  Problem Relation Age of Onset   Cancer Mother    Breast cancer Mother    Bipolar disorder Mother    Heart failure Father    Cancer Father    Heart disease Father    Bipolar disorder Sister    Cancer Other    Heart failure Other     Prior to Admission medications   Medication Sig Start Date End Date Taking? Authorizing Provider  amLODipine  (NORVASC ) 2.5 MG tablet Take 1 tablet (2.5 mg total) by mouth daily. 07/09/23  Yes Alphonsa Glendia LABOR, MD  amphetamine -dextroamphetamine  (ADDERALL XR) 30 MG 24 hr capsule 2 every morning as directed 07/09/23  Yes Luking, Scott A, MD  amphetamine -dextroamphetamine  (ADDERALL XR) 30 MG 24 hr capsule TAKE (2) CAPSULES BY MOUTH EVERY MORNING. MUST LAST 30 DAYS. 07/09/23  Yes Alphonsa Glendia LABOR, MD  amphetamine -dextroamphetamine  (ADDERALL XR) 30 MG 24 hr capsule 2 every morning as directed 07/09/23  Yes Luking, Glendia LABOR, MD  clonazePAM  (KLONOPIN ) 0.5 MG tablet Take 1 tablet (0.5 mg total) by mouth 2 (two) times daily as needed for anxiety. 03/05/22  Yes Alphonsa Glendia LABOR, MD  diclofenac  (VOLTAREN ) 75 MG EC tablet Take 1 tablet (75 mg total) by mouth 2 (two) times daily. 07/09/23  Yes Alphonsa Glendia LABOR, MD  DULoxetine  (CYMBALTA ) 20 MG capsule Take 2 tabs (40 mg total) po once a day 10/09/23  Yes Hoskins, Elveria C, NP  lidocaine  (XYLOCAINE ) 2 % solution TAKE 5 ML TWICE DAILY AS NEEDED FOR CANKER SORES. 06/25/21  Yes Luking, Scott A, MD  lidocaine  (XYLOCAINE ) 5 % ointment APPLY TO AFFECTED AREA TWICE DAILY. NO MORE THAN 5 DAYS AT A TIME. 08/19/22  Yes Alphonsa Glendia LABOR, MD  methocarbamol  (ROBAXIN ) 750 MG tablet Take 1 tablet (750 mg total) by mouth every 8 (eight) hours as needed for muscle spasms. 09/24/22  Yes Marcelino Nurse, MD  nystatin  (MYCOSTATIN /NYSTOP ) powder APPLY AS DIRECTED TO AFFECTED AREAS SEVERAL TIMES PER DAY. MAX FOUR TIMES. 07/28/23  Yes Mauro Elveria BROCKS, NP  omeprazole  (PRILOSEC) 40 MG capsule Take 1 capsule (40 mg total) by mouth daily. 07/09/23  Yes Alphonsa Glendia LABOR, MD  oxyCODONE  (  ROXICODONE ) 5 MG immediate release tablet Take 1 tablet (5 mg total) by mouth every 4 (four) hours as needed for up to 3 days for breakthrough pain (refractory to home Percocet). 10/13/23 10/17/23 Yes Gregory Edsel Ruth, PA  oxyCODONE -acetaminophen  (PERCOCET) 10-325 MG tablet Take 1-2 tablets by mouth 2 (two) times daily as needed for pain (moderate pain). 10/02/23 11/01/23 Yes Patel, Seema K, NP  oxyCODONE -acetaminophen  (PERCOCET) 10-325 MG tablet Take 1-2 tablets by mouth 2 (two) times daily as needed for pain (moderate pain). 11/01/23 12/01/23 Yes Patel, Seema K, NP  pravastatin  (PRAVACHOL ) 20 MG tablet One on Mon Wed and Friday 07/09/23  Yes Luking, Glendia LABOR, MD  silver  sulfADIAZINE  (SSD) 1 % cream APPLY TO WOUNDS ON FEET TWICE DAILY. 06/05/21  Yes Luking, Glendia LABOR, MD  traZODone  (DESYREL ) 100 MG tablet Take 100-200 mg by mouth at bedtime as needed. 06/18/23  Yes [provider]  Vitamin D , Ergocalciferol , (DRISDOL ) 1.25 MG (50000 UNIT) CAPS capsule TAKE 1 CAPSULE ONCE EVERY 7  DAYS. 10/03/23  Yes Alphonsa Glendia LABOR, MD    Physical Exam: Vitals:   10/13/23 1145 10/13/23 1200 10/13/23 1215 10/13/23 1230  BP: 132/83 122/68 138/71 128/78  Pulse: 92 67 (!) 102 (!) 51  Resp: 18 15 13 10   Temp:      TempSrc:      SpO2: 95% 92% 94% 94%   Eyes: PERRL, lids and conjunctivae normal ENMT: Mucous membranes are moist. Posterior pharynx clear of any exudate or lesions.Normal dentition.  Neck: normal, supple, no masses, no thyromegaly Respiratory: clear to auscultation bilaterally, no wheezing, no crackles. Normal respiratory effort. No accessory muscle use.  Cardiovascular: Irregular heart rate, no murmurs / rubs / gallops. No extremity edema. 2+ pedal pulses. No carotid bruits.  Abdomen: no tenderness, no masses palpated. No hepatosplenomegaly. Bowel sounds positive.  Musculoskeletal: no clubbing / cyanosis. No joint deformity upper and lower extremities. Good ROM, no contractures. Normal muscle tone.  Skin: no rashes, lesions, ulcers. No induration Neurologic: CN 2-12 grossly intact. Sensation intact, DTR normal.  Muscle strength 5/5 on both sides Psychiatric: Normal judgment and insight. Alert and oriented x 3. Normal mood.    Data Reviewed:  CBC BMP revealed  Family Communication: None at present Primary team communication: Neurosurgery team Thank you very much for involving us  in the care of your patient.  Total time spent on patient care 60 minutes  Author: Cort ONEIDA Mana, MD 10/13/2023 12:46 PM  For on call review [email or web address].

## 2023-10-14 ENCOUNTER — Telehealth: Payer: Self-pay | Admitting: Family Medicine

## 2023-10-14 ENCOUNTER — Encounter: Payer: Self-pay | Admitting: Neurosurgery

## 2023-10-14 ENCOUNTER — Ambulatory Visit: Payer: Self-pay | Admitting: Family Medicine

## 2023-10-14 NOTE — Telephone Encounter (Signed)
 Nurses Patient recently was in the hospital for a stimulator replacement She had atrial fibrillation occur while she was in the hospital She was released to home yesterday She will need a hospital follow-up next week I have an available appointment on Thursday please go ahead and secure that appointment for her-and please notify her to set this up  If anything unusual is going on with the patient please alert me thank you

## 2023-10-14 NOTE — Anesthesia Postprocedure Evaluation (Signed)
 Anesthesia Post Note  Patient: MARYLN EASTHAM  Procedure(s) Performed: REMOVAL, SPINAL CORD STIMULATOR, THORACIC (Back)  Patient location during evaluation: PACU Anesthesia Type: General Level of consciousness: awake and alert, oriented and patient cooperative Pain management: pain level controlled Vital Signs Assessment: post-procedure vital signs reviewed and stable Respiratory status: spontaneous breathing, nonlabored ventilation and respiratory function stable Cardiovascular status: blood pressure returned to baseline and stable Postop Assessment: adequate PO intake Anesthetic complications: no   No notable events documented.   Last Vitals:  Vitals:   10/13/23 1430 10/13/23 1445  BP: 110/89 113/68  Pulse: 96 91  Resp: 14 14  Temp:  (!) 36.4 C  SpO2: 93% 93%    Last Pain:  Vitals:   10/13/23 1445  TempSrc:   PainSc: 0-No pain                 Aamani Moose

## 2023-10-20 NOTE — Telephone Encounter (Signed)
 Appointment scheduled 10/23/2023 at 10:40 AM with Dr Glendia. Patient notified

## 2023-10-21 ENCOUNTER — Encounter: Admitting: Neurosurgery

## 2023-10-23 ENCOUNTER — Inpatient Hospital Stay: Admitting: Family Medicine

## 2023-10-27 ENCOUNTER — Encounter: Admitting: Physician Assistant

## 2023-10-29 ENCOUNTER — Other Ambulatory Visit: Payer: Self-pay | Admitting: Nurse Practitioner

## 2023-11-10 ENCOUNTER — Ambulatory Visit (INDEPENDENT_AMBULATORY_CARE_PROVIDER_SITE_OTHER): Admitting: Family Medicine

## 2023-11-10 VITALS — BP 124/80 | HR 66 | Temp 98.2°F | Ht 68.0 in | Wt 253.4 lb

## 2023-11-10 DIAGNOSIS — F901 Attention-deficit hyperactivity disorder, predominantly hyperactive type: Secondary | ICD-10-CM

## 2023-11-10 DIAGNOSIS — F324 Major depressive disorder, single episode, in partial remission: Secondary | ICD-10-CM | POA: Diagnosis not present

## 2023-11-10 DIAGNOSIS — E785 Hyperlipidemia, unspecified: Secondary | ICD-10-CM | POA: Diagnosis not present

## 2023-11-10 DIAGNOSIS — I10 Essential (primary) hypertension: Secondary | ICD-10-CM

## 2023-11-10 DIAGNOSIS — R61 Generalized hyperhidrosis: Secondary | ICD-10-CM | POA: Diagnosis not present

## 2023-11-10 DIAGNOSIS — G4700 Insomnia, unspecified: Secondary | ICD-10-CM

## 2023-11-10 DIAGNOSIS — Z79899 Other long term (current) drug therapy: Secondary | ICD-10-CM

## 2023-11-10 MED ORDER — METHYLPHENIDATE HCL ER (OSM) 54 MG PO TBCR
54.0000 mg | EXTENDED_RELEASE_TABLET | ORAL | 0 refills | Status: DC
Start: 2023-11-10 — End: 2024-01-07

## 2023-11-10 NOTE — Progress Notes (Signed)
 Subjective:    Patient ID: Lauren Greene, female    DOB: 1965/08/08, 58 y.o.   MRN: [id number]  HPI  History of Present Illness Lauren Greene is a 58 year old female who presents with excessive sweating and hair loss.  She experiences excessive sweating, describing it as 'dripping off the feet like a waterfall,' even without physical exertion. She reports that her sweating worsened with the use of antidepressants, particularly Effexor , which she found to be the worst in terms of inducing sweating. She is currently taking duloxetine  (Cymbalta ) and has previously taken Celexa (citalopram) for stress. All antidepressants seem to cause sweating, except for Wellbutrin, which she cannot take due to adverse effects on her mood.  She reports hair loss, which she associates with menopause that began at age 25. She experienced loss of underarm hair and reduced hair on her legs. She was prescribed finasteride  but only took it for about a week due to concerns about further hair loss. Despite not continuing the medication, she notes that the hair loss has slowed down.  She is currently taking 20 mg of Percocet in the morning and 20 mg at night for pain management, primarily to lessen pain so she can sleep. She experiences joint pain, particularly in her shoulders, hands, and wrists, which affects her ability to sleep comfortably. She has arthritis in multiple joints except for her titanium knees.  She is on Adderall for ADD, taking 30 mg two daily. The medication does not seem to be as effective as she would like, as she still struggles with starting multiple tasks and not finishing them.  She has a history of a heart murmur and reports occasional palpitations but no significant episodes of lightheadedness or passing out. She monitors her heart rate regularly and has an EKG feature on her watch. She experienced an irregular heartbeat during a past surgery but has not had significant issues since  then.     Review of Systems     Objective:   Physical Exam   General-in no acute distress Eyes-no discharge Lungs-respiratory rate normal, CTA CV-no murmurs,RRR Extremities skin warm dry no edema Neuro grossly normal Behavior normal, alert    11/10/2023    1:46 PM 09/02/2023    2:12 PM 07/09/2023    1:27 PM  PHQ9 SCORE ONLY  PHQ-9 Total Score 10 1  12       Data saved with a previous flowsheet row definition        Assessment & Plan:  Attention-deficit hyperactivity disorder, predominantly inattentive type Current Adderall treatment at maximum dose with persistent inattention. Concerta  considered due to insurance and cost. - Prescribe Concerta  54 mg daily. - Instruct her to check copay with pharmacy before filling prescription. - Advise her to send a MyChart message 2-3 weeks after starting Concerta  to report on its effectiveness. - If Concerta  is effective, provide additional refills. If not, revert to Adderall.  Major depressive disorder Managed with duloxetine . Celexa and Effexor  caused sweating. Wellbutrin not suitable due to mood effects.  Chronic pain due to arthritis Chronic joint pain affecting sleep, managed with Percocet.  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 Percocet aids sleep.  Generalized hyperhidrosis Excessive sweating, likely exacerbated by duloxetine . Effexor  also increased sweating.  Female pattern hair loss Improvement after discontinuing finasteride . Concerns about femininity effects.  Heart murmur Long-standing murmur, no recent changes. Monitors heart rate with smartwatch.  General Health Maintenance Discussed cholesterol monitoring. - Order cholesterol test  within the next two weeks.

## 2023-11-10 NOTE — Patient Instructions (Signed)
 It was good to see you today On your next prescription you will get Concerta  54 mg 1 daily  Please send me a MyChart message 2 to 3 weeks after that to let me know how that medication is going then we can send in additional refills of that medicine or go back to Adderall  Please do your lab work somewhere in the next 2 weeks when you do your lab work try to be well-hydrated they will also get a urine specimen to check for protein at that visit  We will see you back in approximately 4 months TakeCare-Dr. Glendia

## 2023-11-25 ENCOUNTER — Encounter: Admitting: Neurosurgery

## 2023-11-25 ENCOUNTER — Other Ambulatory Visit: Payer: Self-pay | Admitting: Family Medicine

## 2023-11-26 ENCOUNTER — Encounter: Admitting: Nurse Practitioner

## 2023-12-02 ENCOUNTER — Encounter: Admitting: Orthopedic Surgery

## 2023-12-03 ENCOUNTER — Ambulatory Visit: Attending: Nurse Practitioner | Admitting: Nurse Practitioner

## 2023-12-03 ENCOUNTER — Encounter: Payer: Self-pay | Admitting: Nurse Practitioner

## 2023-12-03 VITALS — BP 127/86 | HR 69 | Temp 97.4°F | Resp 18 | Ht 68.0 in | Wt 250.0 lb

## 2023-12-03 DIAGNOSIS — M47816 Spondylosis without myelopathy or radiculopathy, lumbar region: Secondary | ICD-10-CM | POA: Insufficient documentation

## 2023-12-03 DIAGNOSIS — M19012 Primary osteoarthritis, left shoulder: Secondary | ICD-10-CM | POA: Diagnosis not present

## 2023-12-03 DIAGNOSIS — M19011 Primary osteoarthritis, right shoulder: Secondary | ICD-10-CM | POA: Insufficient documentation

## 2023-12-03 DIAGNOSIS — Z79891 Long term (current) use of opiate analgesic: Secondary | ICD-10-CM | POA: Diagnosis not present

## 2023-12-03 DIAGNOSIS — Z9889 Other specified postprocedural states: Secondary | ICD-10-CM | POA: Insufficient documentation

## 2023-12-03 DIAGNOSIS — M961 Postlaminectomy syndrome, not elsewhere classified: Secondary | ICD-10-CM | POA: Diagnosis not present

## 2023-12-03 DIAGNOSIS — G894 Chronic pain syndrome: Secondary | ICD-10-CM | POA: Diagnosis not present

## 2023-12-03 MED ORDER — OXYCODONE-ACETAMINOPHEN 10-325 MG PO TABS: 1.0000 | ORAL_TABLET | Freq: Four times a day (QID) | ORAL | 0 refills | Status: DC | PRN

## 2023-12-03 MED ORDER — OXYCODONE-ACETAMINOPHEN 10-325 MG PO TABS: 1.0000 | ORAL_TABLET | Freq: Four times a day (QID) | ORAL | 0 refills | Status: AC | PRN

## 2023-12-03 NOTE — Progress Notes (Signed)
 Nursing Pain Medication Assessment:  Safety precautions to be maintained throughout the outpatient stay will include: orient to surroundings, keep bed in low position, maintain call bell within reach at all times, provide assistance with transfer out of bed and ambulation.  Medication Inspection Compliance: Pill count conducted under aseptic conditions, in front of the patient. Neither the pills nor the bottle was removed from the patient's sight at any time. Once count was completed pills were immediately returned to the patient in their original bottle.  Medication: Oxycodone /APAP Pill/Patch Count: 49 of 120 pills/patches remain Pill/Patch Appearance: Markings consistent with prescribed medication Bottle Appearance: Standard pharmacy container. Clearly labeled. Filled Date: 08 / 21 / 2025 Last Medication intake:  Today

## 2023-12-03 NOTE — Progress Notes (Signed)
 PROVIDER NOTE: Interpretation of information contained herein should be left to medically-trained personnel. Specific patient instructions are provided elsewhere under Patient Instructions section of medical record. This document was created in part using AI and STT-dictation technology, any transcriptional errors that may result from this process are unintentional.  Patient: Lauren Greene  Service: E/M   PCP: Alphonsa Glendia LABOR, MD  DOB: 08-06-1965  DOS: 12/03/2023  Provider: Emmy MARLA Blanch, NP  MRN: [id number]  Delivery: Face-to-face  Specialty: Interventional Pain Management  Type: Established Patient  Setting: Ambulatory outpatient facility  Specialty designation: 09  Referring Prov.: Alphonsa Glendia LABOR, MD  Location: Outpatient office facility       History of present illness (HPI) [name], a 58 y.o. year old female, is here today because of her Chronic pain syndrome [G89.4]. [name] primary complain today is Back Pain  Pertinent problems: [name] has Periprosthetic fracture around internal prosthetic left knee joint; Chronic back pain; Morbid obesity (HCC); Lumbar degenerative disc disease; Chronic, continuous use of opioids; Fibromyalgia; Lumbar spondylosis; Chronic pain syndrome; and Encounter for long-term opiate analgesic use on their pertinent problem list.  Pain Assessment: Severity of Chronic pain is reported as a 8 /10. Location: Back Lower/Denies. Onset: More than a month ago. Quality: Lauren Greene, Aching. Timing: Intermittent. Modifying factor(s): Nothing. Vitals:  height is 5' 8 (1.727 m) and weight is 250 lb (113.4 kg). Her temporal temperature is 97.4 F (36.3 C) (abnormal). Her blood pressure is 127/86 and her pulse is 69. Her respiration is 18 and oxygen saturation is 99%.  BMI: Estimated body mass index is 38.01 kg/m as calculated from the following:   Height as of this encounter: 5' 8 (1.727 m).   Weight as of this encounter: 250 lb (113.4 kg).  Last  encounter: 09/02/2023. Last procedure: Visit date not found.  Reason for encounter: medication management.  No change in medical history since last visit.  Patient's pain is at baseline.  Patient continues multimodal pain regimen as prescribed.  States that it provides pain relief and improvement in functional status.  She continues to struggle with low back pain, which she attributes to arthritis.  At this time, she declines interventional therapy, as previous treatment have not provided adequate pain relief. Pharmacotherapy Assessment   Analgesic: Oxycodone -acetaminophen  (Percocet) 10-325 mg tablet 1 to 2 tablet 2 times daily as needed. MME=60 Monitoring: [place] PMP: PDMP reviewed during this encounter.       Pharmacotherapy: No side-effects or adverse reactions reported. Compliance: No problems identified. Effectiveness: Clinically acceptable.  Lauren Greene, [place]  12/03/2023  1:24 PM  Sign when Signing Visit Nursing Pain Medication Assessment:  Safety precautions to be maintained throughout the outpatient stay will include: orient to surroundings, keep bed in low position, maintain call bell within reach at all times, provide assistance with transfer out of bed and ambulation.  Medication Inspection Compliance: Pill count conducted under aseptic conditions, in front of the patient. Neither the pills nor the bottle was removed from the patient's sight at any time. Once count was completed pills were immediately returned to the patient in their original bottle.  Medication: Oxycodone /APAP Pill/Patch Count: 49 of 120 pills/patches remain Pill/Patch Appearance: Markings consistent with prescribed medication Bottle Appearance: Standard pharmacy container. Clearly labeled. Filled Date: 08 / 21 / 2025 Last Medication intake:  Today  UDS:  Summary  Date Value Ref Range Status  01/07/2023 Note  Final    Comment:    ==================================================================== [facility]  Select 13 ([facility]) ==================================================================== Test                             Result       Flag       Units  Drug Present and Declared for Prescription Verification   Amphetamine                     >4651        EXPECTED   ng/mg creat    Amphetamine  is available as a schedule II prescription drug.    Oxycodone                       4121         EXPECTED   ng/mg creat   Oxymorphone                    988          EXPECTED   ng/mg creat   Noroxycodone                   >4651        EXPECTED   ng/mg creat   Noroxymorphone                 194          EXPECTED   ng/mg creat    Sources of oxycodone  are scheduled prescription medications.    Oxymorphone, noroxycodone, and noroxymorphone are expected    metabolites of oxycodone . Oxymorphone is also available as a    scheduled prescription medication.  Drug Absent but Declared for Prescription Verification   Clonazepam                      Not Detected UNEXPECTED ng/mg creat ==================================================================== Test                      Result    Flag   Units      Ref Range   Creatinine              215              mg/dL      >=79 ==================================================================== Declared Medications:  The flagging and interpretation on this report are based on the  following declared medications.  Unexpected results may arise from  inaccuracies in the declared medications.   **Note: The testing scope of this panel includes these medications:   Amphetamine  (Adderall)  Clonazepam  (Klonopin )  Oxycodone  (Percocet)   **Note: The testing scope of this panel does not include the  following reported medications:   Acetaminophen  (Percocet)  Amlodipine  (Norvasc )  Diclofenac  (Voltaren )  Duloxetine  (Cymbalta )  Gabapentin  (Neurontin )  Lidocaine  (Xylocaine )  Methocarbamol  (Robaxin )  Nystatin  (Mycostatin )  Omeprazole  (Prilosec)  Pravastatin  (Pravachol )   Sulfadiazine  (Silvadene )  Topiramate  (Topamax )  Trazodone  (Desyrel ) ==================================================================== For clinical consultation, please call [phone]. ====================================================================     No results found for: CBDTHCR No results found for: D8THCCBX No results found for: D9THCCBX  ROS  Constitutional: Denies any fever or chills Gastrointestinal: No reported hemesis, hematochezia, vomiting, or acute GI distress Musculoskeletal: Low back pain Neurological: No reported episodes of acute onset apraxia, aphasia, dysarthria, agnosia, amnesia, paralysis, loss of coordination, or loss of consciousness  Medication Review  DULoxetine , Vitamin D  (Ergocalciferol ), amLODipine , amphetamine -dextroamphetamine , clonazePAM , diclofenac , lidocaine , methocarbamol , methylphenidate , metoprolol  tartrate, nystatin , omeprazole , pravastatin , silver  sulfADIAZINE , and  traZODone   History Review  Allergy: Ms. Daigneault is allergic to wellbutrin [bupropion], crestor  [rosuvastatin ], and lyrica  Egeria.Dura ]. Drug: Ms. Michna  reports no history of drug use. Alcohol:  reports current alcohol use. Tobacco:  reports that she quit smoking about 23 years ago. Her smoking use included cigarettes. She started smoking about 33 years ago. She has a 20 pack-year smoking history. She has never used smokeless tobacco. Social: Ms. Rauda  reports that she quit smoking about 23 years ago. Her smoking use included cigarettes. She started smoking about 33 years ago. She has a 20 pack-year smoking history. She has never used smokeless tobacco. She reports current alcohol use. She reports that she does not use drugs. 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), Hypertension, Localized osteoarthritis of shoulder regions, bilateral (04/24/2021), Lumbar facet arthropathy (10/22/2017), Lumbar radiculopathy (08/23/2019), Lumbar spondylosis (10/22/2017), Migraine, Osteoarthritis, Overactive bladder (04/06/2018), PAD (peripheral artery disease) (HCC) (12/04/2021), Periprosthetic fracture around internal prosthetic left knee joint (08/27/2012), 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 Surgical: Ms. Knezevic  has a past surgical history that includes Total knee arthroplasty (Bilateral); Heel spur surgery; Back surgery; IM nailing femoral shaft fracture (Left, 08/27/2012); Femur IM nail (Left, 08/27/2012); Cervical biopsy w/ loop electrode excision (1990's); Endometrial biopsy (04-05-05 and 02-12-06); Tubal ligation; Shoulder surgery (Left); Vaginal hysterectomy (2018); Foot arthrodesis (Right, 02/01/2020); Achilles tendon lengthening (Right, 02/01/2020); Knee surgery; Bunionectomy; Spinal cord stimulator insertion (N/A, 02/26/2021); Hammer toe surgery (Right); Osteotomy; Bladder surgery; Arthrodesis (Right); and Removal, spinal cord stimulator, thoracic (N/A, 10/13/2023). Family: family history includes Bipolar disorder in her mother and sister; Breast cancer in her mother; Cancer in her father, mother, and another family member; Heart disease in her father; Heart failure in her father and another family member.  Laboratory Chemistry Profile   Renal Lab Results  Component Value Date   BUN 14 10/06/2023   CREATININE 0.80 10/06/2023   BCR 26 (H) 09/27/2022   GFRAA 115 08/30/2019   GFRNONAA >60 10/06/2023     Hepatic Lab Results  Component Value Date   AST 21 09/27/2022   ALT 13 09/27/2022   ALBUMIN 4.2 09/27/2022   ALKPHOS 143 (H) 09/27/2022   LIPASE 25 01/12/2016    Electrolytes Lab Results  Component Value Date   NA 139 10/06/2023   K 4.2 10/06/2023   CL 103 10/06/2023   CALCIUM  9.0 10/06/2023   MG 2.0 10/13/2023   PHOS 3.8 10/13/2023    Bone Lab Results  Component Value Date   VD25OH 31 01/07/2013    Inflammation (CRP: Acute Phase) (ESR: Chronic Phase) Lab Results  Component Value Date   CRP 1 09/27/2022   ESRSEDRATE 4 04/06/2018         Note: Above Lab results reviewed.  Recent Imaging Review  DG Chest 1 View CLINICAL DATA:  [id number] [facility]) [id number]  EXAM: CHEST  1 VIEW  COMPARISON:  September 13, 2022  FINDINGS: Bilateral perihilar interstitial opacities. No focal airspace consolidation, pleural effusion, or pneumothorax. Mild cardiomegaly. Asymmetric prominence of the left suprahilar region. No acute fracture or destructive lesions. Multilevel thoracic osteophytosis. Superior subluxation of the right humeral head with respect  to the glenoid, possibly due to chronic rotator cuff tear.  IMPRESSION: 1. Bilateral perihilar interstitial opacities, likely reflecting bronchovascular crowding due to low lung volumes. Alternatively, atypical/viral infection or interstitial edema could have this appearance, in the correct clinical context. 2. Asymmetric prominence of the left suprahilar region, likely due to tortuosity of the aorta versus prominence of the pulmonary artery. Nonemergent chest CT recommended to exclude lymphadenopathy or mass.  These results will be called to the ordering clinician or representative by the Radiologist Assistant and communication documented in the PACS or [employer].  Electronically Signed   By: Rogelia Myers M.D.   On: 10/13/2023 22:57 Note: Reviewed        Physical Exam  Vitals: BP 127/86 (BP Location: Right Arm,  Patient Position: Sitting)   Pulse 69   Temp (!) 97.4 F (36.3 C) (Temporal)   Resp 18   Ht 5' 8 (1.727 m)   Wt 250 lb (113.4 kg)   LMP 03/19/2003   SpO2 99%   BMI 38.01 kg/m  BMI: Estimated body mass index is 38.01 kg/m as calculated from the following:   Height as of this encounter: 5' 8 (1.727 m).   Weight as of this encounter: 250 lb (113.4 kg). Ideal: Ideal body weight: 63.9 kg (140 lb 14 oz) Adjusted ideal body weight: 83.7 kg (184 lb 8.4 oz) General appearance: Well nourished, well developed, and well hydrated. In no apparent acute distress Mental status: Alert, oriented x 3 (person, place, & time)       Respiratory: No evidence of acute respiratory distress Eyes: PERLA   Assessment   Diagnosis Status  1. Chronic pain syndrome   2. Lumbar facet arthropathy   3. S/P lumbar microdiscectomy (left L4/5 with micordissection)   4. Localized osteoarthritis of shoulder regions, bilateral   5. Failed back surgical syndrome   6. Encounter for long-term opiate analgesic use    Controlled Controlled Controlled   Updated Problems: No problems updated.  Plan of Care  Problem-specific:  Assessment and Plan  We will continue on current medication regimen.  Prescribing drug monitoring (PDMP) reviewed; findings consistent with the use of prescribed medication and no evidence of narcotic misuse or abuse. Routine UDS ordered today.  No other new issues or concerns reported at this visit.  No adverse reaction or or any side effects reported at this visit.  Schedule follow-up in 90 days for medication management.   Ms. Marciana G Bovenzi has a current medication list which includes the following long-term medication(s): amlodipine , amphetamine -dextroamphetamine , amphetamine -dextroamphetamine , amphetamine -dextroamphetamine , clonazepam , duloxetine , methylphenidate , metoprolol  tartrate, omeprazole , and pravastatin .  Pharmacotherapy (Medications Ordered): No orders of the defined types  were placed in this encounter.  Orders:  No orders of the defined types were placed in this encounter.       No follow-ups on file.    Recent Visits No visits were found meeting these conditions. Showing recent visits within past 90 days and meeting all other requirements Today's Visits Date Type Provider Dept  12/03/23 Office Visit Remona Boom K, NP [facility]  Showing today's visits and meeting all other requirements Future Appointments No visits were found meeting these conditions. Showing future appointments within next 90 days and meeting all other requirements  I discussed the assessment and treatment plan with the patient. The patient was provided an opportunity to ask questions and all were answered. The patient agreed with the plan and demonstrated an understanding of the instructions.  Patient advised to call back or  seek an in-person evaluation if the symptoms or condition worsens. I personally spent a total of 30 minutes in the care of the patient today including preparing to see the patient, getting/reviewing separately obtained history, performing a medically appropriate exam/evaluation, counseling and educating, placing orders, documenting clinical information in the EHR, independently interpreting results, and communicating results.   Duration of encounter:  minutes.  Total time on encounter, as per AMA guidelines included both the face-to-face and non-face-to-face time personally spent by the physician and/or other qualified health care professional(s) on the day of the encounter (includes time in activities that require the physician or other qualified health care professional and does not include time in activities normally performed by clinical staff). Physician's time may include the following activities when performed: Preparing to see the patient (e.g., pre-charting review of records, searching for previously ordered imaging, lab work, and nerve  conduction tests) Review of prior analgesic pharmacotherapies. Reviewing PMP Interpreting ordered tests (e.g., lab work, imaging, nerve conduction tests) Performing post-procedure evaluations, including interpretation of diagnostic procedures Obtaining and/or reviewing separately obtained history Performing a medically appropriate examination and/or evaluation Counseling and educating the patient/family/caregiver Ordering medications, tests, or procedures Referring and communicating with other health care professionals (when not separately reported) Documenting clinical information in the electronic or other health record Independently interpreting results (not separately reported) and communicating results to the patient/ family/caregiver Care coordination (not separately reported)  Note by: Ashana Tullo K Maty Zeisler, NP (TTS and AI technology used. I apologize for any typographical errors that were not detected and corrected.) Date: 12/03/2023; Time: 1:28 PM

## 2023-12-03 NOTE — Patient Instructions (Signed)
 Medication refill prior to 03/05/24.

## 2023-12-04 ENCOUNTER — Other Ambulatory Visit: Payer: Self-pay | Admitting: Family Medicine

## 2023-12-07 LAB — TOXASSURE SELECT 13 (MW), URINE

## 2023-12-22 DIAGNOSIS — I10 Essential (primary) hypertension: Secondary | ICD-10-CM | POA: Diagnosis not present

## 2023-12-22 DIAGNOSIS — E785 Hyperlipidemia, unspecified: Secondary | ICD-10-CM | POA: Diagnosis not present

## 2023-12-22 DIAGNOSIS — Z79899 Other long term (current) drug therapy: Secondary | ICD-10-CM | POA: Diagnosis not present

## 2023-12-24 ENCOUNTER — Ambulatory Visit: Payer: Self-pay | Admitting: Family Medicine

## 2023-12-24 LAB — LIPID PANEL
Chol/HDL Ratio: 2.7 ratio (ref 0.0–4.4)
Cholesterol, Total: 161 mg/dL (ref 100–199)
HDL: 60 mg/dL (ref >39)
LDL Chol Calc (NIH): 88 mg/dL (ref 0–99)
Triglycerides: 67 mg/dL (ref 0–149)
VLDL Cholesterol Cal: 13 mg/dL (ref 5–40)

## 2023-12-24 LAB — MICROALBUMIN / CREATININE URINE RATIO
Creatinine, Urine: 439 mg/dL
Microalb/Creat Ratio: 11 mg/g{creat} (ref 0–29)
Microalbumin, Urine: 46.1 ug/mL

## 2023-12-24 LAB — MAGNESIUM: Magnesium: 1.8 mg/dL (ref 1.6–2.3)

## 2023-12-24 LAB — HEPATIC FUNCTION PANEL
ALT: 15 IU/L (ref 0–32)
AST: 21 IU/L (ref 0–40)
Albumin: 4.1 g/dL (ref 3.8–4.9)
Alkaline Phosphatase: 127 IU/L (ref 49–135)
Bilirubin Total: 0.5 mg/dL (ref 0.0–1.2)
Bilirubin, Direct: 0.15 mg/dL (ref 0.00–0.40)
Total Protein: 6.3 g/dL (ref 6.0–8.5)

## 2023-12-24 LAB — BASIC METABOLIC PANEL WITH GFR
BUN/Creatinine Ratio: 23 (ref 9–23)
BUN: 18 mg/dL (ref 6–24)
CO2: 25 mmol/L (ref 20–29)
Calcium: 9.2 mg/dL (ref 8.7–10.2)
Chloride: 104 mmol/L (ref 96–106)
Creatinine, Ser: 0.79 mg/dL (ref 0.57–1.00)
Glucose: 78 mg/dL (ref 70–99)
Potassium: 4.8 mmol/L (ref 3.5–5.2)
Sodium: 140 mmol/L (ref 134–144)
eGFR: 86 mL/min/1.73 (ref >59)

## 2023-12-26 ENCOUNTER — Other Ambulatory Visit: Payer: Self-pay | Admitting: Family Medicine

## 2023-12-31 ENCOUNTER — Encounter: Admitting: Physician Assistant

## 2024-01-06 ENCOUNTER — Encounter: Payer: Self-pay | Admitting: Family Medicine

## 2024-01-06 ENCOUNTER — Other Ambulatory Visit: Payer: Self-pay | Admitting: Nurse Practitioner

## 2024-01-06 DIAGNOSIS — M47816 Spondylosis without myelopathy or radiculopathy, lumbar region: Secondary | ICD-10-CM

## 2024-01-06 DIAGNOSIS — M19011 Primary osteoarthritis, right shoulder: Secondary | ICD-10-CM

## 2024-01-06 DIAGNOSIS — Z79891 Long term (current) use of opiate analgesic: Secondary | ICD-10-CM

## 2024-01-06 DIAGNOSIS — M961 Postlaminectomy syndrome, not elsewhere classified: Secondary | ICD-10-CM

## 2024-01-06 DIAGNOSIS — G894 Chronic pain syndrome: Secondary | ICD-10-CM

## 2024-01-06 DIAGNOSIS — Z9889 Other specified postprocedural states: Secondary | ICD-10-CM

## 2024-01-07 ENCOUNTER — Other Ambulatory Visit: Payer: Self-pay | Admitting: Family Medicine

## 2024-01-07 DIAGNOSIS — F901 Attention-deficit hyperactivity disorder, predominantly hyperactive type: Secondary | ICD-10-CM

## 2024-01-07 MED ORDER — AMPHETAMINE-DEXTROAMPHET ER 30 MG PO CP24: ORAL_CAPSULE | ORAL | 0 refills | Status: DC

## 2024-01-08 ENCOUNTER — Other Ambulatory Visit: Payer: Self-pay | Admitting: Nurse Practitioner

## 2024-02-03 DIAGNOSIS — Z01812 Encounter for preprocedural laboratory examination: Secondary | ICD-10-CM | POA: Diagnosis not present

## 2024-02-03 DIAGNOSIS — E559 Vitamin D deficiency, unspecified: Secondary | ICD-10-CM | POA: Diagnosis not present

## 2024-02-03 DIAGNOSIS — M21961 Unspecified acquired deformity of right lower leg: Secondary | ICD-10-CM | POA: Diagnosis not present

## 2024-02-03 DIAGNOSIS — M2041 Other hammer toe(s) (acquired), right foot: Secondary | ICD-10-CM | POA: Diagnosis not present

## 2024-02-03 DIAGNOSIS — L97511 Non-pressure chronic ulcer of other part of right foot limited to breakdown of skin: Secondary | ICD-10-CM | POA: Diagnosis not present

## 2024-02-03 DIAGNOSIS — Z9889 Other specified postprocedural states: Secondary | ICD-10-CM | POA: Diagnosis not present

## 2024-02-03 DIAGNOSIS — M79671 Pain in right foot: Secondary | ICD-10-CM | POA: Diagnosis not present

## 2024-02-03 DIAGNOSIS — Z87891 Personal history of nicotine dependence: Secondary | ICD-10-CM | POA: Diagnosis not present

## 2024-02-03 DIAGNOSIS — M2011 Hallux valgus (acquired), right foot: Secondary | ICD-10-CM | POA: Diagnosis not present

## 2024-02-17 ENCOUNTER — Other Ambulatory Visit (HOSPITAL_COMMUNITY): Payer: Self-pay | Admitting: Family Medicine

## 2024-02-17 DIAGNOSIS — Z1231 Encounter for screening mammogram for malignant neoplasm of breast: Secondary | ICD-10-CM

## 2024-02-19 ENCOUNTER — Encounter (HOSPITAL_COMMUNITY)

## 2024-02-19 DIAGNOSIS — Z1231 Encounter for screening mammogram for malignant neoplasm of breast: Secondary | ICD-10-CM

## 2024-02-25 ENCOUNTER — Ambulatory Visit: Admitting: Nurse Practitioner

## 2024-02-25 DIAGNOSIS — G894 Chronic pain syndrome: Secondary | ICD-10-CM

## 2024-02-25 DIAGNOSIS — M47816 Spondylosis without myelopathy or radiculopathy, lumbar region: Secondary | ICD-10-CM

## 2024-02-25 DIAGNOSIS — M19011 Primary osteoarthritis, right shoulder: Secondary | ICD-10-CM

## 2024-02-25 DIAGNOSIS — Z91199 Patient's noncompliance with other medical treatment and regimen due to unspecified reason: Secondary | ICD-10-CM

## 2024-02-25 DIAGNOSIS — Z9889 Other specified postprocedural states: Secondary | ICD-10-CM

## 2024-02-25 DIAGNOSIS — M961 Postlaminectomy syndrome, not elsewhere classified: Secondary | ICD-10-CM

## 2024-02-25 DIAGNOSIS — Z79891 Long term (current) use of opiate analgesic: Secondary | ICD-10-CM

## 2024-02-25 NOTE — Progress Notes (Signed)
 02/25/2024-No show

## 2024-02-26 ENCOUNTER — Other Ambulatory Visit: Payer: Self-pay | Admitting: Family Medicine

## 2024-03-03 NOTE — Progress Notes (Unsigned)
 PROVIDER NOTE: Interpretation of information contained herein should be left to medically-trained personnel. Specific patient instructions are provided elsewhere under Patient Instructions section of medical record. This document was created in part using AI and STT-dictation technology, any transcriptional errors that may result from this process are unintentional.  Patient: Lauren Greene  Service: E/M   PCP: Alphonsa Glendia LABOR, MD  DOB: 28-Jun-1965  DOS: 03/04/2024  Provider: Emmy MARLA Blanch, NP  MRN: [id number]  Delivery: Face-to-face  Specialty: Interventional Pain Management  Type: Established Patient  Setting: Ambulatory outpatient facility  Specialty designation: 09  Referring Prov.: Alphonsa Glendia LABOR, MD  Location: Outpatient office facility       History of present illness (HPI) [name], a 58 y.o. year old female, is here today because of her Chronic pain syndrome [G89.4]. [name]'s primary complain today is No chief complaint on file.  Pertinent problems: [name] has Attention deficit disorder; Periprosthetic fracture around internal prosthetic left knee joint; Chronic arthralgias of knees and hips; Chronic back pain; Arthritis; Osteoarthritis; Moderate episode of recurrent major depressive disorder (HCC); Lumbar degenerative disc disease; Fibromyalgia; Lumbar spondylosis; Lumbar facet arthropathy; Chronic pain syndrome; Encounter for long-term opiate analgesic use; Lumbar radiculopathy; Localized osteoarthritis of shoulder regions, bilateral; Chronic pain of both shoulders; Failed back surgical syndrome; and S/P lumbar microdiscectomy (left L4/5 with micordissection) on their pertinent problem list.  Pain Assessment: Severity of   is reported as a  /10. Location:    / . Onset:  . Quality:  . Timing:  . Modifying factor(s):  SABRA Vitals:  vitals were not taken for this visit.  BMI: Estimated body mass index is 38.01 kg/m as calculated from the following:   Height as of  12/03/23: 5' 8 (1.727 m).   Weight as of 12/03/23: 250 lb (113.4 kg).  Last encounter: 01/06/2024. Last procedure: 01/06/2024.  Reason for encounter: {Blank single:19197::medication management,post-procedure evaluation and assessment,both, medication management and post-procedure evaluation and assessment,evaluation of worsening, or previously known (established) problem,patient-requested evaluation,follow-up evaluation,evaluation for possible interventional PM therapy/treatment,evaluation of new problem, *** }.   Discussed the use of AI scribe software for clinical note transcription with the patient, who gave verbal consent to proceed.  History of Present Illness           Pharmacotherapy Assessment   Analgesic: Oxycodone -acetaminophen  (Percocet) 10-325 mg tablet 1 to 2 tablet 2 times daily as needed. MME=60 Monitoring: [place] PMP: PDMP reviewed during this encounter.       Pharmacotherapy: No side-effects or adverse reactions reported. Compliance: No problems identified. Effectiveness: Clinically acceptable.  Erlene Doyal Lauren Greene, [place]  12/03/2023  1:24 PM  Sign when Signing Visit Nursing Pain Medication Assessment:  Safety precautions to be maintained throughout the outpatient stay will include: orient to surroundings, keep bed in low position, maintain call bell within reach at all times, provide assistance with transfer out of bed and ambulation.  Medication Inspection Compliance: Pill count conducted under aseptic conditions, in front of the patient. Neither the pills nor the bottle was removed from the patient's sight at any time. Once count was completed pills were immediately returned to the patient in their original bottle.  Medication: Oxycodone /APAP Pill/Patch Count: 49 of 120 pills/patches remain Pill/Patch Appearance: Markings consistent with prescribed medication Bottle Appearance: Standard pharmacy container. Clearly labeled. Filled Date: 08 / 21 / 2025 Last  Medication intake:  Today  UDS:  Summary  Date Value Ref Range Status  01/07/2023 Note  Final    Comment:    ====================================================================  [employer] Select 13 (MW) ==================================================================== Test                             Result       Flag       Units  Drug Present and Declared for Prescription Verification   Amphetamine                     >4651        EXPECTED   ng/mg creat    Amphetamine  is available as a schedule II prescription drug.    Oxycodone                       4121         EXPECTED   ng/mg creat   Oxymorphone                    988          EXPECTED   ng/mg creat   Noroxycodone                   >4651        EXPECTED   ng/mg creat   Noroxymorphone                 194          EXPECTED   ng/mg creat    Sources of oxycodone  are scheduled prescription medications.    Oxymorphone, noroxycodone, and noroxymorphone are expected    metabolites of oxycodone . Oxymorphone is also available as a    scheduled prescription medication.  Drug Absent but Declared for Prescription Verification   Clonazepam                      Not Detected UNEXPECTED ng/mg creat ==================================================================== Test                      Result    Flag   Units      Ref Range   Creatinine              215              mg/dL      >=79 ==================================================================== Declared Medications:  The flagging and interpretation on this report are based on the  following declared medications.  Unexpected results may arise from  inaccuracies in the declared medications.   **Note: The testing scope of this panel includes these medications:   Amphetamine  (Adderall)  Clonazepam  (Klonopin )  Oxycodone  (Percocet)   **Note: The testing scope of this panel does not include the  following reported medications:   Acetaminophen  (Percocet)  Amlodipine  (Norvasc )   Diclofenac  (Voltaren )  Duloxetine  (Cymbalta )  Gabapentin  (Neurontin )  Lidocaine  (Xylocaine )  Methocarbamol  (Robaxin )  Nystatin  (Mycostatin )  Omeprazole  (Prilosec)  Pravastatin  (Pravachol )  Sulfadiazine  (Silvadene )  Topiramate  (Topamax )  Trazodone  (Desyrel ) ==================================================================== For clinical consultation, please call [phone]. ====================================================================     No results found for: CBDTHCR No results found for: D8THCCBX No results found for: D9THCCBX  ROS  Constitutional: Denies any fever or chills Gastrointestinal: No reported hemesis, hematochezia, vomiting, or acute GI distress Musculoskeletal: Low back pain Neurological: No reported episodes of acute onset apraxia, aphasia, dysarthria, agnosia, amnesia, paralysis, loss of coordination, or loss of consciousness  Medication Review  DULoxetine , Vitamin D  (Ergocalciferol ), amLODipine , amphetamine -dextroamphetamine , clonazePAM , diclofenac , lidocaine , methocarbamol , methylphenidate , metoprolol  tartrate, nystatin , omeprazole , pravastatin , silver  sulfADIAZINE ,  and traZODone   History Review  Allergy: Ms. Bettinger is allergic to wellbutrin [bupropion], crestor  [rosuvastatin ], and lyrica  [pregabalin ]. Drug: Ms. Sandall  reports no history of drug use. Alcohol:  reports current alcohol use. Tobacco:  reports that she quit smoking about 23 years ago. Her smoking use included cigarettes. She started smoking about 33 years ago. She has a 20 pack-year smoking history. She has never used smokeless tobacco. Social: Ms. Abruzzo  reports that she quit smoking about 23 years ago. Her smoking use included cigarettes. She started smoking about 33 years ago. She has a 20 pack-year smoking history. She has never used smokeless tobacco. She reports current alcohol use. She reports that she does not use drugs. 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), Hypertension, Localized osteoarthritis of shoulder regions, bilateral (04/24/2021), Lumbar facet arthropathy (10/22/2017), Lumbar radiculopathy (08/23/2019), Lumbar spondylosis (10/22/2017), Migraine, Osteoarthritis, Overactive bladder (04/06/2018), PAD (peripheral artery disease) (HCC) (12/04/2021), Periprosthetic fracture around internal prosthetic left knee joint (08/27/2012), 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 Surgical: Ms. Haydon  has a past surgical history that includes Total knee arthroplasty (Bilateral); Heel spur surgery; Back surgery; IM nailing femoral shaft fracture (Left, 08/27/2012); Femur IM nail (Left, 08/27/2012); Cervical biopsy w/ loop electrode excision (1990's); Endometrial biopsy (04-05-05 and 02-12-06); Tubal ligation; Shoulder surgery (Left); Vaginal hysterectomy (2018); Foot arthrodesis (Right, 02/01/2020); Achilles tendon lengthening (Right, 02/01/2020); Knee surgery; Bunionectomy; Spinal cord stimulator insertion (N/A, 02/26/2021); Hammer toe surgery (Right); Osteotomy; Bladder surgery; Arthrodesis (Right); and Removal, spinal cord stimulator, thoracic (N/A, 10/13/2023). Family: family history includes Bipolar disorder in her mother and sister; Breast cancer in her mother; Cancer in her father, mother, and another family member; Heart disease in her father; Heart failure in her father and another family member.  Laboratory  Chemistry Profile   Renal Lab Results  Component Value Date   BUN 14 10/06/2023   CREATININE 0.80 10/06/2023   BCR 26 (H) 09/27/2022   GFRAA 115 08/30/2019   GFRNONAA >60 10/06/2023    Hepatic Lab Results  Component Value Date   AST 21 09/27/2022   ALT 13 09/27/2022   ALBUMIN 4.2 09/27/2022   ALKPHOS 143 (H) 09/27/2022   LIPASE 25 01/12/2016    Electrolytes Lab Results  Component Value Date   NA 139 10/06/2023   K 4.2 10/06/2023   CL 103 10/06/2023   CALCIUM  9.0 10/06/2023   MG 2.0 10/13/2023   PHOS 3.8 10/13/2023    Bone Lab Results  Component Value Date   VD25OH 31 01/07/2013    Inflammation (CRP: Acute Phase) (ESR: Chronic Phase) Lab Results  Component Value Date   CRP 1 09/27/2022   ESRSEDRATE 4 04/06/2018         Note: Above Lab results reviewed.  Recent Imaging Review  DG Chest 1 View CLINICAL DATA:  [id number] Afib [facility]) [id number]  EXAM: CHEST  1 VIEW  COMPARISON:  September 13, 2022  FINDINGS: Bilateral perihilar interstitial opacities. No focal airspace consolidation, pleural effusion, or pneumothorax. Mild cardiomegaly. Asymmetric prominence of the left suprahilar region. No acute fracture or destructive lesions. Multilevel thoracic osteophytosis. Superior subluxation of the right humeral head with  respect to the glenoid, possibly due to chronic rotator cuff tear.  IMPRESSION: 1. Bilateral perihilar interstitial opacities, likely reflecting bronchovascular crowding due to low lung volumes. Alternatively, atypical/viral infection or interstitial edema could have this appearance, in the correct clinical context. 2. Asymmetric prominence of the left suprahilar region, likely due to tortuosity of the aorta versus prominence of the pulmonary artery. Nonemergent chest CT recommended to exclude lymphadenopathy or mass.  These results will be called to the ordering clinician or representative by the Radiologist Assistant and communication documented in  the PACS or [employer].  Electronically Signed   By: Rogelia Myers M.D.   On: 10/13/2023 22:57 Note: Reviewed        Physical Exam  Vitals: BP 127/86 (BP Location: Right Arm, Patient Position: Sitting)   Pulse 69   Temp (!) 97.4 F (36.3 C) (Temporal)   Resp 18   Ht 5' 8 (1.727 m)   Wt 250 lb (113.4 kg)   LMP 03/19/2003   SpO2 99%   BMI 38.01 kg/m  BMI: Estimated body mass index is 38.01 kg/m as calculated from the following:   Height as of this encounter: 5' 8 (1.727 m).   Weight as of this encounter: 250 lb (113.4 kg). Ideal: Ideal body weight: 63.9 kg (140 lb 14 oz) Adjusted ideal body weight: 83.7 kg (184 lb 8.4 oz) General appearance: Well nourished, well developed, and well hydrated. In no apparent acute distress Mental status: Alert, oriented x 3 (person, place, & time)       Respiratory: No evidence of acute respiratory distress Eyes: PERLA   Assessment   Diagnosis Status  1. Chronic pain syndrome   2. Lumbar facet arthropathy   3. S/P lumbar microdiscectomy (left L4/5 with micordissection)   4. Localized osteoarthritis of shoulder regions, bilateral   5. Failed back surgical syndrome   6. Encounter for long-term opiate analgesic use    Controlled Controlled Controlled   Updated Problems: No problems updated.  Plan of Care  Problem-specific:  Assessment and Plan  We will continue on current medication regimen.  Prescribing drug monitoring (PDMP) reviewed; findings consistent with the use of prescribed medication and no evidence of narcotic misuse or abuse. Routine UDS ordered today.  No other new issues or concerns reported at this visit.  No adverse reaction or or any side effects reported at this visit.  Schedule follow-up in 90 days for medication management.   Ms. KASI LASKY has a current medication list which includes the following long-term medication(s): amlodipine , amphetamine -dextroamphetamine , amphetamine -dextroamphetamine ,  amphetamine -dextroamphetamine , clonazepam , duloxetine , methylphenidate , metoprolol  tartrate, omeprazole , and pravastatin .  Pharmacotherapy (Medications Ordered): No orders of the defined types were placed in this encounter.  Orders:  No orders of the defined types were placed in this encounter.   Monitoring: [place] PMP: PDMP not reviewed this encounter. 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),No side-effects or adverse reactions reported.} Compliance: {Blank single:19197::Medication agreement violation - unsanctioned acquisition/use of additional opioid-containing medication,No problems identified.} Effectiveness: {Blank single:19197::Clinically acceptable.}  No notes on file  UDS:  Summary  Date Value Ref Range Status  12/03/2023 FINAL  Final    Comment:    ==================================================================== [employer] Select 13 (MW) ==================================================================== Test                             Result  Flag       Units  Drug Present and Declared for Prescription Verification   Amphetamine                     >5155        EXPECTED   ng/mg creat    Amphetamine  is available as a schedule II prescription drug.    Oxycodone                       >5155        EXPECTED   ng/mg creat   Oxymorphone                    926          EXPECTED   ng/mg creat   Noroxycodone                   >5155        EXPECTED   ng/mg creat   Noroxymorphone                 312          EXPECTED   ng/mg creat    Sources of oxycodone  are scheduled prescription medications.    Oxymorphone, noroxycodone, and noroxymorphone are expected    metabolites of oxycodone . Oxymorphone is also available as a    scheduled prescription medication.  Drug Absent but Declared for  Prescription Verification   Clonazepam                      Not Detected UNEXPECTED ng/mg creat ==================================================================== Test                      Result    Flag   Units      Ref Range   Creatinine              194              mg/dL      >=79 ==================================================================== Declared Medications:  The flagging and interpretation on this report are based on the  following declared medications.  Unexpected results may arise from  inaccuracies in the declared medications.   **Note: The testing scope of this panel includes these medications:   Amphetamine  (Adderall)  Clonazepam  (Klonopin )  Oxycodone  (Percocet)   **Note: The testing scope of this panel does not include the  following reported medications:   Acetaminophen  (Percocet)  Amlodipine  (Norvasc )  Diclofenac  (Voltaren )  Duloxetine  (Cymbalta )  Lidocaine  (Xylocaine )  Methocarbamol  (Robaxin )  Methylphenidate  (Concerta )  Metoprolol  (Lopressor )  Nystatin  (Mycostatin )  Omeprazole  (Prilosec)  Pravastatin  (Pravachol )  Sulfadiazine  (Silvadene )  Trazodone  (Desyrel )  Vitamin D2 (Drisdol ) ==================================================================== For clinical consultation, please call [phone]. ====================================================================     No results found for: CBDTHCR No results found for: D8THCCBX No results found for: D9THCCBX  ROS  Constitutional: {Blank single:19197::Denies any fever or chills} Gastrointestinal: {Blank single:19197::No reported hemesis, hematochezia, vomiting, or acute GI distress} Musculoskeletal: {Blank single:19197::Denies any acute onset joint swelling, redness, loss of ROM, or weakness} Neurological: {Blank single:19197::No reported episodes of acute onset apraxia, aphasia, dysarthria, agnosia, amnesia, paralysis, loss of coordination, or loss of  consciousness}  Medication Review  DULoxetine , Vitamin D  (Ergocalciferol ), amLODipine , amphetamine -dextroamphetamine , clonazePAM , diclofenac , lidocaine , methocarbamol , metoprolol  tartrate, nystatin , omeprazole , oxyCODONE -acetaminophen , pravastatin , silver  sulfADIAZINE , and traZODone   History Review  Allergy: Ms. Mcferran is allergic to wellbutrin [bupropion], crestor  [rosuvastatin ], and lyrica  [pregabalin ]. Drug: Ms. Bohlen  reports  no history of drug use. Alcohol:  reports current alcohol use. Tobacco:  reports that she quit smoking about 23 years ago. Her smoking use included cigarettes. She started smoking about 33 years ago. She has a 20 pack-year smoking history. She has never used smokeless tobacco. Social: Ms. Amrein  reports that she quit smoking about 23 years ago. Her smoking use included cigarettes. She started smoking about 33 years ago. She has a 20 pack-year smoking history. She has never used smokeless tobacco. She reports current alcohol use. She reports that she does not use drugs. 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), Hypertension, Localized osteoarthritis of shoulder regions, bilateral (04/24/2021), Lumbar facet arthropathy (10/22/2017), Lumbar radiculopathy (08/23/2019), Lumbar spondylosis (10/22/2017), Migraine, Osteoarthritis, Overactive bladder (04/06/2018), PAD (peripheral artery disease) (12/04/2021), Periprosthetic fracture around internal prosthetic left knee joint (08/27/2012), 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 Surgical: Ms. Timme  has a past surgical history that includes Total knee arthroplasty (Bilateral); Heel spur surgery; Back surgery; IM nailing femoral shaft fracture (Left, 08/27/2012); Femur IM nail (Left, 08/27/2012); Cervical biopsy w/ loop electrode excision (1990's); Endometrial biopsy (04-05-05 and 02-12-06); Tubal ligation; Shoulder surgery (Left); Vaginal hysterectomy (2018); Foot arthrodesis (Right, 02/01/2020); Achilles tendon lengthening (Right, 02/01/2020); Knee surgery; Bunionectomy; Spinal cord stimulator insertion (N/A, 02/26/2021); Hammer toe surgery (Right); Osteotomy; Bladder surgery; Arthrodesis (Right); and Removal, spinal cord stimulator, thoracic (N/A, 10/13/2023). Family: family history includes Bipolar disorder in her mother and sister; Breast cancer in her mother; Cancer in her father, mother, and another family member; Heart disease in her father; Heart failure in her father and another family member.  Laboratory Chemistry Profile   Renal Lab Results  Component Value Date   BUN 18 12/22/2023   CREATININE 0.79 12/22/2023   BCR 23 12/22/2023   GFRAA 115 08/30/2019   GFRNONAA >60 10/06/2023    Hepatic Lab Results  Component Value Date   AST 21 12/22/2023   ALT 15 12/22/2023   ALBUMIN 4.1 12/22/2023   ALKPHOS 127 12/22/2023   LIPASE 25 01/12/2016    Electrolytes Lab Results  Component Value Date   NA 140 12/22/2023   K 4.8 12/22/2023   CL 104 12/22/2023   CALCIUM  9.2 12/22/2023   MG 1.8 12/22/2023   PHOS 3.8 10/13/2023    Bone Lab Results  Component Value Date   VD25OH 31 01/07/2013    Inflammation (CRP: Acute Phase) (ESR: Chronic Phase) Lab Results  Component Value Date   CRP 1 09/27/2022   ESRSEDRATE 4 04/06/2018         Note: {Blank single:19197::Ms. Fei indicates labs done and monitored by primary care practitioner using a  non-[facility] EMR system,No results found under the [employer] electronic medical record,Patient noncompliant with Lab Work orders.,Results made available to patient.,Lab results reviewed and made available to patient.,Lab results reviewed and explained to patient in Layman's terms.,Above Lab results reviewed.}  Recent Imaging Review  DG Chest 1 View CLINICAL DATA:  [id number] Afib [facility]) [id number]  EXAM: CHEST  1 VIEW  COMPARISON:  September 13, 2022  FINDINGS: Bilateral perihilar interstitial opacities. No focal airspace consolidation, pleural effusion, or pneumothorax. Mild cardiomegaly. Asymmetric  prominence of the left suprahilar region. No acute fracture or destructive lesions. Multilevel thoracic osteophytosis. Superior subluxation of the right humeral head with respect to the glenoid, possibly due to chronic rotator cuff tear.  IMPRESSION: 1. Bilateral perihilar interstitial opacities, likely reflecting bronchovascular crowding due to low lung volumes. Alternatively, atypical/viral infection or interstitial edema could have this appearance, in the correct clinical context. 2. Asymmetric prominence of the left suprahilar region, likely due to tortuosity of the aorta versus prominence of the pulmonary artery. Nonemergent chest CT recommended to exclude lymphadenopathy or mass.  These results will be called to the ordering clinician or representative by the Radiologist Assistant and communication documented in the PACS or [employer].  Electronically Signed   By: Rogelia Myers M.D.   On: 10/13/2023 22:57 Note: {Blank single:19197::No new results found.,No results found under the [facility] electronic medical record.,Imaging results reviewed and explained to patient in Layman's terms.,Results of ordered imaging test(s) reviewed and explained to patient in Layman's terms.,Imaging results reviewed.,Reviewed} {Blank single:19197::Results visible under [facility].,Results visible under [facility].,Results visible under [facility].,Results visible under [facility].,Results visible under Care Everywhere.,Results made available to patient.,Copy of results provided to patient.,Patient noncompliant with diagnostic imaging orders.,     }  Physical Exam  Vitals: LMP 03/19/2003  BMI: Estimated body mass index is 38.01 kg/m as calculated from the following:   Height as of 12/03/23: 5' 8 (1.727 m).   Weight as of 12/03/23: 250 lb (113.4 kg). Ideal: Patient weight not recorded General appearance: {general exam:[id number]::Well nourished, well developed, and well hydrated. In no apparent acute distress} Mental status: {Blank single:19197::Alert and oriented x 3. Exaggerated physical and/or psychosocial pain behavior perceived.,Alert, oriented x 3 (person, place, & time)} {Blank single:19197::Ms. Stull's speech pattern and demeanor seems to suggest oversedation,     } Respiratory: {Blank single:19197::Oxygen-dependent COPD,No evidence of acute respiratory distress} Eyes: {Blank single:19197::Miotic (pupilary constriction) due to opiate use,Midriatic,Anisocoric,Evidence of ptosis,Pin-point pupils,PERLA}   Assessment   Diagnosis Status  1. Chronic pain syndrome   2. Lumbar facet arthropathy   3. S/P lumbar microdiscectomy (left L4/5 with micordissection)   4. Localized osteoarthritis of shoulder regions, bilateral   5. Failed back surgical syndrome   6. Encounter for long-term opiate analgesic use   7. Spinal stenosis, lumbar region, with neurogenic claudication   8. Chronic radicular lumbar pain   9. Lumbar radiculopathy    {Blank single:19197::Absent,Deteriorating,Having a Flare-up,Improved,Improving,Not improving,Not responding,Persistent,Present,Recurring,Reoccurring,Resolved,Responding,Stable,Unchanged,improved,Worsened,Worsening,Controlled} {Blank  single:19197::Absent,Deteriorating,Having a Flare-up,Improved,Improving,Not improving,Not responding,Persistent,Present,Recurring,Reoccurring,Resolved,Responding,Stable,Unchanged,improved,Worsened,Worsening,Controlled} {Blank single:19197::Absent,Deteriorating,Having a Flare-up,Improved,Improving,Not improving,Not responding,Persistent,Present,Recurring,Reoccurring,Resolved,Responding,Stable,Unchanged,improved,Worsened,Worsening,Controlled}   Updated Problems: No problems updated.  Plan of Care  Problem-specific:  Assessment and Plan            Ms. KEISA BLOW has a current medication list which includes the following long-term medication(s): amlodipine , amphetamine -dextroamphetamine , amphetamine -dextroamphetamine , amphetamine -dextroamphetamine , clonazepam , duloxetine , metoprolol  tartrate, omeprazole , pravastatin , and trazodone .  Pharmacotherapy (Medications Ordered): No orders of the defined types were placed in this encounter.  Orders:  No orders of the defined types were placed in this encounter.    {There is no content from the last Plan section.}   No follow-ups on file.    Recent Visits No visits were found meeting these conditions. Showing recent visits within past 90 days and meeting all other requirements Future Appointments Date Type Provider Dept  03/04/24 Appointment Pricsilla Lindvall K, NP [facility]  Showing future appointments within next 90 days and meeting all other requirements  I discussed the assessment and treatment plan with the patient. The patient  was provided an opportunity to ask questions and all were answered. The patient agreed with the plan and demonstrated an understanding of the instructions.  Patient advised to call back or seek an in-person evaluation if the symptoms or condition worsens.  Duration of encounter: *** minutes.  Total time on encounter, as per  AMA guidelines included both the face-to-face and non-face-to-face time personally spent by the physician and/or other qualified health care professional(s) on the day of the encounter (includes time in activities that require the physician or other qualified health care professional and does not include time in activities normally performed by clinical staff). Physician's time may include the following activities when performed: Preparing to see the patient (e.g., pre-charting review of records, searching for previously ordered imaging, lab work, and nerve conduction tests) Review of prior analgesic pharmacotherapies. Reviewing PMP Interpreting ordered tests (e.g., lab work, imaging, nerve conduction tests) Performing post-procedure evaluations, including interpretation of diagnostic procedures Obtaining and/or reviewing separately obtained history Performing a medically appropriate examination and/or evaluation Counseling and educating the patient/family/caregiver Ordering medications, tests, or procedures Referring and communicating with other health care professionals (when not separately reported) Documenting clinical information in the electronic or other health record Independently interpreting results (not separately reported) and communicating results to the patient/ family/caregiver Care coordination (not separately reported)  Note by: Adaleah Forget K Jenisha Faison, NP (TTS and AI technology used. I apologize for any typographical errors that were not detected and corrected.) Date: 03/04/2024; Time: 2:51 PM

## 2024-03-04 ENCOUNTER — Ambulatory Visit: Admitting: Nurse Practitioner

## 2024-03-04 ENCOUNTER — Encounter: Payer: Self-pay | Admitting: Nurse Practitioner

## 2024-03-04 VITALS — BP 149/92 | HR 69 | Temp 97.9°F | Resp 20 | Ht 68.0 in | Wt 255.0 lb

## 2024-03-04 DIAGNOSIS — G894 Chronic pain syndrome: Secondary | ICD-10-CM | POA: Diagnosis present

## 2024-03-04 DIAGNOSIS — M48062 Spinal stenosis, lumbar region with neurogenic claudication: Secondary | ICD-10-CM | POA: Insufficient documentation

## 2024-03-04 DIAGNOSIS — G8929 Other chronic pain: Secondary | ICD-10-CM | POA: Diagnosis present

## 2024-03-04 DIAGNOSIS — M19012 Primary osteoarthritis, left shoulder: Secondary | ICD-10-CM | POA: Diagnosis present

## 2024-03-04 DIAGNOSIS — M5416 Radiculopathy, lumbar region: Secondary | ICD-10-CM | POA: Insufficient documentation

## 2024-03-04 DIAGNOSIS — M47816 Spondylosis without myelopathy or radiculopathy, lumbar region: Secondary | ICD-10-CM | POA: Diagnosis present

## 2024-03-04 DIAGNOSIS — Z79891 Long term (current) use of opiate analgesic: Secondary | ICD-10-CM | POA: Insufficient documentation

## 2024-03-04 DIAGNOSIS — M961 Postlaminectomy syndrome, not elsewhere classified: Secondary | ICD-10-CM | POA: Insufficient documentation

## 2024-03-04 DIAGNOSIS — Z9889 Other specified postprocedural states: Secondary | ICD-10-CM | POA: Insufficient documentation

## 2024-03-04 DIAGNOSIS — M19011 Primary osteoarthritis, right shoulder: Secondary | ICD-10-CM | POA: Insufficient documentation

## 2024-03-04 MED ORDER — METHOCARBAMOL 750 MG PO TABS: 750.0000 mg | ORAL_TABLET | Freq: Three times a day (TID) | ORAL | 5 refills | Status: AC | PRN

## 2024-03-04 MED ORDER — NALOXONE HCL 4 MG/0.1ML NA LIQD: 1.0000 | NASAL | 1 refills | Status: AC | PRN

## 2024-03-04 MED ORDER — OXYCODONE-ACETAMINOPHEN 10-325 MG PO TABS: 1.0000 | ORAL_TABLET | Freq: Four times a day (QID) | ORAL | 0 refills | Status: AC | PRN

## 2024-03-04 NOTE — Progress Notes (Signed)
 Nursing Pain Medication Assessment:  Safety precautions to be maintained throughout the outpatient stay will include: orient to surroundings, keep bed in low position, maintain call bell within reach at all times, provide assistance with transfer out of bed and ambulation.  Medication Inspection Compliance: Pill count conducted under aseptic conditions, in front of the patient. Neither the pills nor the bottle was removed from the patient's sight at any time. Once count was completed pills were immediately returned to the patient in their original bottle.  Medication: Oxycodone /APAP Pill/Patch Count: 28 of 120 pills/patches remain Pill/Patch Appearance: Markings consistent with prescribed medication Bottle Appearance: Standard pharmacy container. Clearly labeled. Filled Date: 60 / 24 / 2025 Last Medication intake:  Today

## 2024-03-04 NOTE — Patient Instructions (Signed)
 ______________________________________________________________________    Opioid Pain Medication Update  To: All patients taking opioid pain medications. (I.e.: hydrocodone , hydromorphone , oxycodone , oxymorphone, morphine , codeine, methadone, tapentadol, tramadol, buprenorphine , fentanyl , etc.)  Re: Updated review of side effects and adverse reactions of opioid analgesics, as well as new information about long term effects of this class of medications.  Direct risks of long-term opioid therapy are not limited to opioid addiction and overdose. Potential medical risks include serious fractures, breathing problems during sleep, hyperalgesia, immunosuppression, chronic constipation, bowel obstruction, myocardial infarction, and tooth decay secondary to xerostomia.  Unpredictable adverse effects that can occur even if you take your medication correctly: Cognitive impairment, respiratory depression, and death. Most people think that if they take their medication correctly, and as instructed, that they will be safe. Nothing could be farther from the truth. In reality, a significant amount of recorded deaths associated with the use of opioids has occurred in individuals that had taken the medication for a long time, and were taking their medication correctly. The following are examples of how this can happen: Patient taking his/her medication for a long time, as instructed, without any side effects, is given a certain antibiotic or another unrelated medication, which in turn triggers a Drug-to-drug interaction leading to disorientation, cognitive impairment, impaired reflexes, respiratory depression or an untoward event leading to serious bodily harm or injury, including death.  Patient taking his/her medication for a long time, as instructed, without any side effects, develops an acute impairment of liver and/or kidney function. This will lead to a rapid inability of the body to breakdown and eliminate  their pain medication, which will result in effects similar to an overdose, but with the same medicine and dose that they had always taken. This again may lead to disorientation, cognitive impairment, impaired reflexes, respiratory depression or an untoward event leading to serious bodily harm or injury, including death.  A similar problem will occur with patients as they grow older and their liver and kidney function begins to decrease as part of the aging process.  Background information: Historically, the original case for using long-term opioid therapy to treat chronic noncancer pain was based on safety assumptions that subsequent experience has called into question. In 1996, the American Pain Society and the American Academy of Pain Medicine issued a consensus statement supporting long-term opioid therapy. This statement acknowledged the dangers of opioid prescribing but concluded that the risk for addiction was low; respiratory depression induced by opioids was short-lived, occurred mainly in opioid-naive patients, and was antagonized by pain; tolerance was not a common problem; and efforts to control diversion should not constrain opioid prescribing. This has now proven to be wrong. Experience regarding the risks for opioid addiction, misuse, and overdose in community practice has failed to support these assumptions.  According to the Centers for Disease Control and Prevention, fatal overdoses involving opioid analgesics have increased sharply over the past decade. Currently, more than 96,700 people die from drug overdoses every year. Opioids are a factor in 7 out of every 10 overdose deaths. Deaths from drug overdose have surpassed motor vehicle accidents as the leading cause of death for individuals between the ages of 87 and 26.  Clinical data suggest that neuroendocrine dysfunction may be very common in both men and women, potentially causing hypogonadism, erectile dysfunction, infertility,  decreased libido, osteoporosis, and depression. Recent studies linked higher opioid dose to increased opioid-related mortality. Controlled observational studies reported that long-term opioid therapy may be associated with increased risk for cardiovascular events. Subsequent  meta-analysis concluded that the safety of long-term opioid therapy in elderly patients has not been proven.   Side Effects and adverse reactions: Common side effects: Drowsiness (sedation). Dizziness. Nausea and vomiting. Constipation. Physical dependence -- Dependence often manifests with withdrawal symptoms when opioids are discontinued or decreased. Tolerance -- As you take repeated doses of opioids, you require increased medication to experience the same effect of pain relief. Respiratory depression -- This can occur in healthy people, especially with higher doses. However, people with COPD, asthma or other lung conditions may be even more susceptible to fatal respiratory impairment.  Uncommon side effects: An increased sensitivity to feeling pain and extreme response to pain (hyperalgesia). Chronic use of opioids can lead to this. Delayed gastric emptying (the process by which the contents of your stomach are moved into your small intestine). Muscle rigidity. Immune system and hormonal dysfunction. Quick, involuntary muscle jerks (myoclonus). Arrhythmia. Itchy skin (pruritus). Dry mouth (xerostomia).  Long-term side effects: Chronic constipation. Sleep-disordered breathing (SDB). Increased risk of bone fractures. Hypothalamic-pituitary-adrenal dysregulation. Increased risk of overdose.  RISKS: Respiratory depression and death: Opioids increase the risk of respiratory depression and death.  Drug-to-drug interactions: Opioids are relatively contraindicated in combination with benzodiazepines, sleep inducers, and other central nervous system depressants. Other classes of medications (i.e.: certain antibiotics  and even over-the-counter medications) may also trigger or induce respiratory depression in some patients.  Medical conditions: Patients with pre-existing respiratory problems are at higher risk of respiratory failure and/or depression when in combination with opioid analgesics. Opioids are relatively contraindicated in some medical conditions such as central sleep apnea.   Fractures and Falls:  Opioids increase the risk and incidence of falls. This is of particular importance in elderly patients.  Endocrine System:  Long-term administration is associated with endocrine abnormalities (endocrinopathies). (Also known as Opioid-induced Endocrinopathy) Influences on both the hypothalamic-pituitary-adrenal axis?and the hypothalamic-pituitary-gonadal axis have been demonstrated with consequent hypogonadism and adrenal insufficiency in both sexes. Hypogonadism and decreased levels of dehydroepiandrosterone sulfate have been reported in men and women. Endocrine effects include: Amenorrhoea in women (abnormal absence of menstruation) Reduced libido in both sexes Decreased sexual function Erectile dysfunction in men Hypogonadisms (decreased testicular function with shrinkage of testicles) Infertility Depression and fatigue Loss of muscle mass Anxiety Depression Immune suppression Hyperalgesia Weight gain Anemia Osteoporosis Patients (particularly women of childbearing age) should avoid opioids. There is insufficient evidence to recommend routine monitoring of asymptomatic patients taking opioids in the long-term for hormonal deficiencies.  Immune System: Human studies have demonstrated that opioids have an immunomodulating effect. These effects are mediated via opioid receptors both on immune effector cells and in the central nervous system. Opioids have been demonstrated to have adverse effects on antimicrobial response and anti-tumour surveillance. Buprenorphine  has been demonstrated to have  no impact on immune function.  Opioid Induced Hyperalgesia: Human studies have demonstrated that prolonged use of opioids can lead to a state of abnormal pain sensitivity, sometimes called opioid induced hyperalgesia (OIH). Opioid induced hyperalgesia is not usually seen in the absence of tolerance to opioid analgesia. Clinically, hyperalgesia may be diagnosed if the patient on long-term opioid therapy presents with increased pain. This might be qualitatively and anatomically distinct from pain related to disease progression or to breakthrough pain resulting from development of opioid tolerance. Pain associated with hyperalgesia tends to be more diffuse than the pre-existing pain and less defined in quality. Management of opioid induced hyperalgesia requires opioid dose reduction.  Cancer: Chronic opioid therapy has been associated with an increased risk  of cancer among noncancer patients with chronic pain. This association was more evident in chronic strong opioid users. Chronic opioid consumption causes significant pathological changes in the small intestine and colon. Epidemiological studies have found that there is a link between opium  dependence and initiation of gastrointestinal cancers. Cancer is the second leading cause of death after cardiovascular disease. Chronic use of opioids can cause multiple conditions such as GERD, immunosuppression and renal damage as well as carcinogenic effects, which are associated with the incidence of cancers.   Mortality: Long-term opioid use has been associated with increased mortality among patients with chronic non-cancer pain (CNCP).  Prescription of long-acting opioids for chronic noncancer pain was associated with a significantly increased risk of all-cause mortality, including deaths from causes other than overdose.  Reference: Von Korff M, Kolodny A, Deyo RA, Chou R. Long-term opioid therapy reconsidered. Ann Intern Med. 2011 Sep 6;155(5):325-8. doi:  10.7326/[id number]. PMID: 78106373; PMCID: [id number]. Kit JINNY Laurence CINDERELLA Pearley JINNY, Hayward RA, Dunn KM, Jordan KP. Risk of adverse events in patients prescribed long-term opioids: A cohort study in the [place] Clinical Practice Research [employer]. Eur J Pain. 2019 May;23(5):908-922. doi: 10.1002/ejp.1357. Epub 2019 Jan 31. PMID: 69379883. Colameco S, Coren JS, Ciervo CA. Continuous opioid treatment for chronic noncancer pain: a time for moderation in prescribing. Postgrad Med. 2009 Jul;121(4):61-6. doi: 10.3810/pgm.[id number]. PMID: 80358728. Gigi JONELLE Shlomo MILUS Levern IVER Conny RN, [place], Blazina I, Lonell DASEN, Bougatsos C, Deyo RA. The effectiveness and risks of long-term opioid therapy for chronic pain: a systematic review for a [employer] of [employer] Pathways to [employer]. Ann Intern Med. 2015 Feb 17;162(4):276-86. doi: 10.7326/M14-2559. PMID: 74418742. Rory CHRISTELLA [facility], [place] DM. NCHS Data Brief No. 22. [place]: Centers for Disease Control and Prevention; 2009. Sep, Increase in Fatal Poisonings Involving Opioid Analgesics in the [place] , 1999-2006. Song IA, Choi HR, Oh TK. Long-term opioid use and mortality in patients with chronic non-cancer pain: Ten-year follow-up study in [place] from 2010 through 2019. EClinicalMedicine. 2022 Jul 18;51:101558. doi: 10.1016/j.eclinm.[id number]. PMID: 64124182; PMCID: [id number]. Huser, W., Schubert, T., Vogelmann, T. et al. All-cause mortality in patients with long-term opioid therapy compared with non-opioid analgesics for chronic non-cancer pain: a database study. BMC Med 18, 162 (2020). [email or web address].[email or web address] Rashidian H, Zendehdel K, Kamangar F, Malekzadeh R, Haghdoost AA. An Ecological Study of the Association betwe ______________________________________________________________________    Update on Controlled Substance (Opioid) Regulations   To: All patients taking opioid pain  medications. (I.e.: hydrocodone , hydromorphone , oxycodone , oxymorphone, morphine , codeine, methadone, tapentadol, tramadol, buprenorphine , fentanyl , etc.)  Re: Review on the state of controlled substance regulations.  Introduction: Rules and regulations associated with all aspects of controlled substances are constantly being modified. Unfortunately we have encountered patients questioning the veracity of the information that we provide them about these changes. This is intended to provide them with appropriate references and a historical review of these changes.  A Brief History: As of January 01, 2016, the [place]  Government declared the opioid epidemic a public health emergency. Prescription drug monitoring programs (PDMPs) and the [facility] All Schedules Prescription Electronic Reporting Act (NASPER). Before 1800, clinicians regarded pain as an existential phenomenon, a consequence of aging. There was no regulation on the use of cocaine and opioids, resulting in widespread marketing and prescribing for many ailments ranging from diarrhea to toothache. The [employer] of [phone], passed in response to the sudden emergence of street heroin abuse as well as iatrogenic morphine  dependence, influenced both physician and patient alike to avoid  opiates. Patients with unexplained pain in the 1920s were regarded as deluded, malingering, or abusers, and cancer patients through the 1950s were encouraged to wean themselves off opioids until their lives could be measured in weeks. Alongside this opioid evolution, the American Pain Society launched their influential pain as the fifth vital sign campaign in 03/29/94. Concurrently, pharmaceutical companies introduced new formulations, such as extended release oxycodone  (OxyContin ). From 03/29/96 to 03/29/01, OxyContin  prescriptions increased from 670,000 to 6.2 million. However, concerns soon began to surface regarding overzealous opioid treatment. It must be noted  that pharmaceutical companies contributed significantly to the rise of the opioid epidemic, receiving considerable reprimands as a consequence. In March 29, 2006, as the opioid epidemic began to inflict profound damage, [employer] pleaded guilty to federal charges related to the misbranding of OxyContin . [employer] agreed to pay a total of $634.5 million to resolve Justice Department investigations, as well as a $19.5 million settlement to [address] and the [address] of [place].  In response to the current epidemic, changes in focus to the development of new abuse deterrent opioid formulations at the [place]  Food and Drug Administration (FDA) as well as drafting of new public standards for pain treatment were created at [facility] in 2016/03/29. In response to the opioid epidemic, FDA public policy changes were announced in February 2016. Among these new positions were a re-examination of the risk-benefit paradigm for opioids with strict emphasis on the large public health ramifications. The various modified opioids released over the past 20 years, such as tamper-resistant preparation, have had differing levels of success, and are collectively referred to as Risk Evaluation and Mitigation Strategies (REMS). There is also a growing focus on preventing opioid use disorder (OUD) and on offering affected individuals accessible and effective treatment. [place]  government policy reflects these changes and both the Affordable Care Act and the Mental Health Parity and Addiction Equity Act were major steps forward in treating opioid addiction. The Affordable Care Act, which was signed into law in 03-29-2009, with major provisions coming into effect by 03/29/13.  In the 1990s, the intensified marketing of newly reformulated prescription opioid medications (e.g., OxyContin ) and an influential pain advocacy campaign that encouraged greater pain management led to a precipitous rise in opioid use in the [place] . Research from the Centers for Disease Control  and Prevention (CDC) shows that prescription opioid sales in the [place]  quadrupled from 03/29/98 to 03/29/09. At the same time, opioid misuse and opioid-involved overdose deaths increased (Figure 1). Between 29-Mar-1998 and 03/29/09, the rate of opioidinvolved overdose deaths in the [place]  doubled from 2.9 to 6.8 deaths per 100,000 people. This initial rise in opioid-related deaths is often referred to as the first wave of the recent opioid crisis.  Between 03/29/1998 and Mar 30, 2019, 565,000 Americans died of opioid-involved overdoses. In turn, federal, state, and local governments responded with various legal and policy efforts to curb opioid misuse and drug-related overdose Deaths.  Recent Congresses have enacted several laws addressing the opioid crisis, such as the Comprehensive Addiction and Recovery Act of 2015/03/30 (CARA, P.L. 114-198); the 03/29/2024 Century Cures Act (P.L. 114-255); the Substance UseDisorder Prevention that Promotes Opioid Recovery and Treatment for Patients and Communities Act (SUPPORT Act, P.L. [phone]); the Fentanyl  Sanctions Act (Title LXXII of P.L. [id number]); and the Blocking Deadly Fentanyl  Imports Act (P.L. 117-81, 6610). These laws addressed overprescribing and misuse of opioids, expanded substance use disorder prevention and treatment capacities, bolstered drug diversion capabilities, and enhanced international drug interdiction, counternarcotics cooperation, and sanctions efforts. Congress also  directed additional funds to many of these initiatives through appropriations.  Congress provided funding in the [employer] Act of 2021 [phone]; P.L. 117-2) for syringe services programs (often known as needle exchange programs) and other harm reduction initiatives. Federal and state harm reduction strategies have frequently involved the distribution of naloxone  (e.g., Narcan )--a medication used to reverse an opioid overdose--and test strips used to detect fentanyl  in drug  samples.  The Department of Justice (DOJ) and Department of Homeland Security [facility]) aim to reduce the diversion of prescription opioids and the use, manufacturing, and trafficking of illicit opioids. DOJ--via the Drug Enforcement Administration (DEA)--regulates opioid manufacturers, distributors, and dispensers; it also controls the opioid supply through enforcement of regulatory requirements.  A History of Opiate Laws in the [place]   Prior to 1890, laws concerning opiates were strictly imposed on a local city or state-by-state basis. One of the first was in [place] in 1875 where it became illegal to smoke opium  only in opium  dens. It did not ban the sale, import or use otherwise. In the next 25 years different states enacted opium  laws ranging from outlawing opium  dens altogether to making possession of opium , morphine  and heroin without a physicians prescription illegal.  The first Congressional Act took place in 1890 that levied taxes on morphine  and opium . From that time on the [employer] has had a series of laws and acts directly aimed at opiate use, abuse and control. These are outlined below:  1906 - Pure Food and Drug Act Preventing the manufacture, sale, or transportation of adulterated or misbranded or poisonous or deleterious foods, drugs, medicines, and liquors, and for regulating traffic therein, and for other purposes. Punishment included fines and prison time.  1909   - Smoking Opium  Exclusion Act Banned the importation, possession and use of smoking opium . Did not regulate opium -based medications. First Freight forwarder banning the non-medical use of a substance.  1914  - The Margrette Act In summary, The Margrette Act of 1914 was written more to have all parties involved in importing, exporting, set designer and distributing opium  or cocaine to register with the [employer] and have taxes levied upon them. Exempt from the law were physicians  operating in the course of his professional practice  1919 - Supreme Court ratified the Bj's in [place] et [place]., v. [place]  and [place]  v. [place], then again in [facility] v. [place] , in 1920, holding that doctors may not prescribe maintenance supplies of narcotics to people addicted to narcotics. However, it does not prohibit doctors from prescribing narcotics to wean a patient off of the drug. It was also the opinion of the court that prescribing narcotics to habitual users was not considered professional practice hence it then was considered illegal for doctors to prescribe opioids for the purposes of maintaining an addiction. It can be argued that todays addiction medications are not intended to maintain an addiction but to facilitate addiction remission. In which case, this opinion of the court should not preclude practitioners from prescribing buprenorphine  or methadone to patients suffering from an addictive disorder.  1924  - Heroin Act Architectural technologist, importation and possession of heroin illegal - even for medicinal use.  1922 -- Narcotic Drug Import and Export Act Enacted to assure proper control of importation, sale, possession, production and consumption of narcotics.  1927  -- Special Educational Needs Teacher of [employer] of Prohibition was responsible for tracking bootleggers and organized conservation officer, historic buildings. They focused primarily on interstate and  international cases and those cases where local law enforcement official would not or could not act.  1932 -- Uniform State Narcotic Act Encouraged states to pass uniform state laws matching the federal Narcotic Drug Import and Export Act. Suggested prohibiting cannabis use at the state level.  33 -- Food, Drug, and Cosmetic Act The new law brought cosmetics and medical devices under control, and it required that drugs be labeled with adequate directions for safe use. Moreover, it mandated pre-market  approval of all new drugs, such that a manufacturer would have to prove to FDA that a drug were safe before it could be sold  1951 -- Boggs Act Imposed maximum criminal penalties for violations of the import/export and internal revenue laws related to drugs and also established mandatory minimum prison sentences.  1956 -- Narcotics Control Act Increased Boggs Act penalties and mandatory prison sentence minimums for violations of existing drug laws.  1965 -- Drug Abuse Control Amendment Enacted to deal with problems caused by abuse of depressants, stimulants and hallucinogens. Restricted research into psychoactive drugs such as LSD by requiring FDA approval.  1970 -- Controlled Substance Act  Controlled Substances Import and Export Act These laws are a consolidation of numerous laws regulating the manufacture and distribution of narcotics, stimulants, depressants, hallucinogens, anabolic steroids, and chemicals used in the illicit production of controlled substances. The CSA places all substances that are regulated under existing federal law into one of five schedules. This placement is based upon the substance's medicinal value, harmfulness, and potential for abuse or addiction. Schedule I is reserved for the most dangerous drugs that have no recognized medical use, while Schedule V is the classification used for the least dangerous drugs. The act also provides a mechanism for substances to be controlled, added to a schedule, decontrolled, removed from control, rescheduled, or transferred from one schedule to another.  58 - Drug Enforcement Agency By Executive Order, the DEA was formed to take place of the [employer] of Narcotics and Dangerous Drugs.  2 -Narcotic Addict Treatment Act of  1974  - Public Law [phone] Amends the Controlled Substance Act of 1970 to provide for the registration of practitioners conducting narcotic treatment programs. [methadone clinics] It also provides legal  definitions for the phrases maintenance treatment and detoxification treatment.  1986 -- Anti-Drug Abuse Act of 1986 Strengthened Federal efforts to encourage foreign cooperation in eradicating illicit drug crops and in halting international drug traffic, to improve enforcement of Federal drug laws and enhance interdiction of illicit drug shipments, to provide strong Market researcher in establishing effective drug abuse prevention and education programs, to [employer] support for drug abuse treatment and rehabilitation efforts, and for other purposes. It also re-imposed mandatory sentencing minimums depending on which drug and how much was involved.  1988 -- Anti-Drug Abuse Act of 1988 Established the Office of Materials Engineer (ONDCP) in the [employer] of the Economist; authorized funds for [employer], state and local drug enforcement activities, school-based drug prevention efforts, and drug abuse treatment with special emphasis on injecting drug abusers at high risk for AIDS.  2000 -- Federal - The Drug Addiction Treatment Act of 2000 (DATA 2000) It enables qualified physicians to prescribe and/or dispense narcotics for the purpose of treating opioid dependency. For the first time, physicians are able to treat this disease from their private offices or other clinical settings. This presents a very desirable treatment option for those who are unwilling or unable to seek help in drug treatment clinics. Patients  can now be treated in the privacy of their doctors office, as are other people being treated for any other type of medical condition. One medicine doctors may now prescribe is Buprenorphine . The major downfall of this Act is the limitation of 30 patients per practice - which means that large facilities, no matter how many physicians are there, can only treat 30 patients at a time.  2002-- DEA reschedules buprenorphine  from a schedule V drug to a schedule III drug, on  December 22, 2000 - the day before the FDA approval of Suboxone and Subutex  despite overwhelming objection by the medical community.  2004: June 2004 THE CONFIDENTIALITY OF ALCOHOL AND DRUG ABUSE PATIENT RECORDS REGULATION AND THE HIPAA PRIVACY RULE:  Confidentiality of Alcohol and Drug Dependence Patient Records (summary) Code of Federal Regulations Title 42 Part 2 (42 CFR Part 2)  The confidentiality of alcohol and drug dependence patient records maintained by this practice/program is protected by federal law and regulations. Generally, the practice/program may not say to a person outside the practice/program that a patient attends the practice/program, or disclose any information identifying a patient as being alcohol or drug dependent unless:  The patient consents in writing; The disclosure is allowed by a court order, or The disclosure is made to medical personnel in a medical emergency or to qualified personnel for research,  audit, or practice/program evaluation. Violation of the federal law and regulations by a practice/program is a crime. Suspected violations may be reported to appropriate authorities in accordance with federal regulations. Freight forwarder and regulations do not protect any information about a crime committed by a patient either at the practice/program or against any person who works for the practice/program or about any threat to commit such a crime. Federal laws and regulations do not protect any information about suspected child abuse or neglect from being reported under state law to appropriate state or local authorities.  sample consent form (MS-WORD)  2005: 10-18-2003 Public law [phone], Amends the Controlled Substances Act to eliminate the 30-patient limit for medical group practices allowed to dispense narcotic drugs in schedules III, IV, or V for maintenance or detoxification treatment (retains the 30-patient limit for an individual physician). This amendment removes the  30-patient limit on group medical practices that treat opioid dependence with buprenorphine . The restriction was part of the original Drug Addiction Treatment Act of 2000 (DATA) that allowed treatment of opioid dependence in a doctor's office. With this change, every certified doctor may now prescribe buprenorphine  up to his or her individual physician limit of 30 patients.  2006: On 03/15/2005 President Levy signed Bill H.R.6344 into law. This allows physicians who have been certified to prescribe certain drugs for the treatment of opioid dependence under DATA2000 to treat up to 100 patients (up from 30) by submitting an intent notification to the Dept of Health and [employer]. This is a major step forward in both fighting the stigma and allowing access to treatment previously not available to some. For more details see 30/100-PATIENT LIMIT  2016: HHS augments regulations concerning the 30/100 patient limit by raising the limit to 275 for qualifying physicians. Link to summary of regulation  2016: Comprehensive Addiction and Recovery Act of 2016 (sec.303) amends the Controlled Substance Act - to allow Nurse Practitioners and Physician Assistants to become eligible to prescribe buprenorphine  for the treatment of opioid use disorder. See the entire law for more details.  The roots of the concurrent regulation of certain drugs under two statutory schemes go back  to the beginning of this century. In 1906, Congress enacted the Pure Food and Drug Act, establishing one regime of regulation to assure (among other things) that drugs were not adulterated or misbranded. These regulations were amended several times, recodified in 1938, and expanded on again from the 1940s through the 1990s. Their implementation and enforcement is today assigned to the Food and Drug Administration (FDA) in the Department of Health and Human Services [facility]).  In 1914, Congress adopted the [place] Narcotic Act to stop abuse of  addictive drugs. The [place] Narcotic Act was amended in 1937 to include marijuana. In 1965, amphetamines, barbiturates, and hallucinogens came under regulation, but under the [employer], Drug, and Cosmetic Act. In 1970, these various statutes were consolidated and recodified as the Controlled Substances Act (CSA), which has been amended several times since then. Its implementation and enforcement is today assigned to the Drug Enforcement Administration (DEA) in the Department of Justice.  The first clash occurred after World War I, when so-called morphine  clinics existed and physicians prescribed or dispensed morphine  to addicts. Some addicts were veterans of the American Civil War, the Spanish-American War, and WWI, who had become addicted during treatment for war wounds, but most of them came from the growing population of nonmedical addicts ([place], 8017). The Narcotics Division of the Prohibition Unit of the Department of the Treasury, which was then responsible for enforcing the [facility] Narcotic Act, concluded that this activity was not the legitimate practice of medicine but simple drug trafficking. The Treasury Department swiftly closed the clinics and made it personally and professionally risky for physicians to maintain a narcotic addict for any reason. In did so, however, only after the American Medical Association had adopted a resolution, in 1920, opposing ambulatory clinics''.  In 1972, the public health establishment, including the Secretary of Health, Education, and Welfare, the Education Officer, Environmental, the [employer] of [employer], and the Chemical Engineer for Drug Abuse Prevention, was unprepared to allow [employer] of Narcotics and Dangerous Drugs, DEA's predecessor agency, to unilaterally define the parameters of medical practice for the use of methadone in the treatment of heroin addiction. As a consequence, a new set of rules--the third, on top of the FDA  and DEA schemes--was added, one that inserted FDA deeply into the practice of medicine, notwithstanding its protestations to the contrary. Congress ratified this joint responsibility of law enforcement and public health officials for methadone through this third set of rules in 1974 with the passage of the Narcotic Addict Treatment Act (NATA). To examine in detail the evolution of this third set of rules--commonly referred to as the FDA or DHHS methadone regulations--we turn, first, to the period of the mid-1960s.  Increased use of heroin in the post World War II period first became apparent in the early to mid 1950s. During the [employer], a minimum mandatory narcotics law was enacted in 1956, effective July 1957. 1962 [facility] conference on drug abuse, the [employer] on Narcotic and Drug Abuse (the [employer]) of 1963, the Drug Abuse Control Amendments of 1965, the President's Commission on [employer] and Administration of Justice (the [employer]) of [phone], and the Narcotic Addiction Rehabilitation Act of 1966.  The 1965 Drug Abuse Control Amendments brought under strict federal control all nonnarcotic drugs capable of producing serious psychotoxic effects when abused. This act also created the [employer] of Drug Abuse Control within the Department of Health, Education, and Welfare (DHEW) and shifted the basis for  federal patent examiner of illegal drugs from tax principles (administered by the Department of Treasury) to the regulation of commerce (administered by the [employer]).  The 1966 Narcotic Addiction Rehabilitation Act TOUR MANAGER) authorized the civil commitment of narcotic addicts, and federal assistance to state and local governments to develop a local system of drug treatment programs. With respect to the latter, the [employer] of Mental Health [facility]) initially proposed the gradual implementation of the state assistance effort, mainly  through a common mental health mechanism--inpatient treatment programs. However, because of a perceived pressing need, the courts began to commit addicts to these programs even before they were officially opened or staffed. The NARA legislation imposed the following contract requirements on treatment centers: (1) thrice-a-week counseling sessions; (2) weekly urine tests; (3) restorative dental services; (4) psychological consultations and vocational training; and (5) the treatment modalities of drug-free outpatient, therapeutic community, and methadone maintenance. Reorganization Plan No. 1 of 1968 transferred the primary functions of the [employer] of Narcotics (FBN) from the [employer] to the Department of Justice; it also transferred the [employer] of Drug Abuse Control functions to the Department of Justice. Within the [employer], the [employer] of Narcotics and Dangerous Drugs (BNDD) was created, which became the Drug Enforcement Administration in 1973.   Under the first [place] administration [phone]), federal drug abuse policy developed in a significant way. These developments included a 1969 war on drugs presidential message, resulting legislation in 1970, and a Special Action Office created by executive order in 1971 and authorized in statute in 1972. Brynn, in 1969, to send a message to Congress on drug abuse. Although this was the first time that a U.S. president invoked the war on drugs image, it was in retrospect the most balanced approach to the problem of drug abuse that had been advanced. The 1969 message resulted in the submission of legislation to the Congress and the passage, the following year, of the Comprehensive Drug Abuse Prevention and Control Act of 1970 [employer] [phone], January 11, 1969). The act dealt with research, treatment, and prevention of drug abuse and drug dependence, and with drug abuse charity fundraiser. One major purpose of the 1970  legislation was to reverse some of the strictures of the [employer] of 1914. The 1970 act sought to clarify for the medical profession . . . the extent to which they may safely go in treating narcotic addicts as patients. Title I, in Section IV, charged the Surveyor, Minerals, Education, and Welfare, to determine the appropriate methods of professional practice in the medical treatment of the narcotic addiction of various classes of narcotic addicts. This provision constitutes the initial statutory basis for treatment standards. The law enforcement sections consolidated all prior federal statutes into the Controlled Substances Act and the Controlled Substances Export and Import Act (Titles II and III, respectively, of the Comprehensive Drug Abuse Prevention and Control Act of 1970). Under this legislation, substances were classified under five schedules according to their abuse potential, and psychological and physical effects. Methadone was placed in Schedule II, along with such opiate drugs as morphine , codeine, and hydrocodone .  One of the most important steps taken by President Brynn was to establish in June 1971 the Special Action Office for Drug Abuse Prevention (SAODAP) in the [employer] of the President ([place] [phone], September 01, 1969). In mid-1971, [facility] appointed Dr. Maple Dunnings as SAODAP director. Within a year, the Drug Abuse Prevention Office and Treatment Act of 1972 [employer] [phone],  June 06, 1970) gave statutory authority to [facility], but limiting setting, on September 14, 1973, as the limit on its existence.  The purpose of the 1972 act was to bring the resources of the federal government to bear on drug abuse with the immediate objective of significantly reducing its incidence and developing a comprehensive, coordinated long-term federal strategy to combat drug abuse.  Narcotic Addict Treatment Act [employer]) of 1974 [employer] [phone]), which amended the Controlled  Substances Act. This legislation was driven by concern for the diversion of methadone to illicit channels that was occurring in 1972 and 1973, as reflected in the title of the Senate bill adopted on August 24, 1971, the Methadone Diversion Control Act of 1973. (U.S. Senate, 1970a, 8029a).  The 1980 final rule (45 FR 37305, December 05, 1978) reduced the minimum standard for admission from two years of addiction to one year coupled with a clinical determination that the individual was currently physiologically.  The regulations were next revised in 1989, following two proposals to modify them, one in 1983 and one in 1987.  Under President Tanda Corrente, a government-wide effort was made to review all federal government regulations and to eliminate or reduce the burden of these regulations on the private sector, state and nash-finch company, and wps resources.   The 1983 recommendations, though not adopted, did initiate another revision of the methadone regulations, which first found expression in a 1987 proposed rule (52 FR 37047, December 17, 1985) and culminated in a final rule (54 FR 8954, May 18, 1987) at the end of the decade. In the 1987 proposed rule, the FDA and NIDA, in an effort to put the best face on the unenthusiastic 1983 response by the provider community to converting the regulations to guidelines, indicated that they had retained the current requirements necessary to achieve the goals of the 1974 NATA, but were proposing to streamline the regulation and to promote more efficient operation of methadone programs. The 1987 proposed rule, issued by the FDA and NIDA, advanced the following changes in the methadone regulation: that detoxification treatment be divided into short-term (<21 days) and long-term (>21 and <180 days) treatment; that the minimum staffing ratio of one counselor to 50 patients be eliminated; that blood tests be allowed as ways to conduct initial drug screening or  to meet the monthly testing requirements for six-day take-home patients; that the 72-hour notification of FDA and the pertinent state authority for methadone doses greater than 100 mg be eliminated; that special adverse reaction reporting requirements for methadone be eliminated and reliance placed upon general FDA reporting requirements; that a supervising counselor be allowed to conduct the annual review of the patient's treatment plan for certain qualified patients who had been in treatment for 3 years or longer; and that the requirement of an annual report of methadone treatment programs to the FDA be dropped. The FDA and NIDA issued a final rule on May 18, 1987, based on comments on the 1987 proposed (54 FR 8954). Concurrently, FDA and NIDA issued a six-page guidance document, which noted that the regulations, over time, had recommended certain practices that were not actually required. Public Health Service, in Congress, and elsewhere, to reorganize the Alcohol, Drug Abuse, and Mental Health Administration (ADAMHA). These efforts culminated in the [employer] of 1992 [employer] [phone], September 25, 1990), the main purpose of which was to transfer the research portions of the three ADAMHA institutes--NIDA, the [employer] Mills of Alcoholism and Alcohol Abuse, and the [employer]  of Mental Health--to the [employer] and to create the Substance Abuse and Mental Health Services Administration [facility]) as the home for the service functions of these entitles.  Guidelines for Opioid Treatment The Federal Guidelines for Opioid Treatment Programs - 2015 serve as a guide to accrediting organizations for developing accreditation standards. The guidelines also provide OTPs with information on how programs can achieve and maintain compliance with federal regulations. The 2015 guidelines are an update to the 2007 Guidelines for the Accreditation of Opioid Treatment Programs  (PDF  547 KB). The new document reflects the obligation of OTPs to deliver care consistent with the patient-centered, integrated, and recovery-oriented standards of substance use treatment.  DPT oversees the certification of OTPs and provides guidance to nonprofit organizations and state governmental entities that want to become a SAMHSA-approved accrediting body. Learn more about the accreditation and certification of OTPs and [facility] oversight of OTP accreditation bodies.  Model Guidelines for [employer] With input from [facility], the Federation of [employer] in 2013 adopted a revised version of the federations office-based opioid treatment policies. The Model Policy on DATA 2000 and Treatment of Opioid Addiction in the Medical Office - 2013 (PDF  279 KB) provides model guidelines for use by state medical boards in regulating office-based opioid treatment.  Holiday Guidance for Opioid Treatment Programs (PDF  203 KB) In response to requests for the upcoming federal holidays and ensuing weekends (December 24th, 25th, and 26th and December 31st, Jan 1st, and Jan 2nd), this letter is to provide guidance regarding requests for unsupervised doses of medication for patients for these dates. View a sample [id number] (PDF  194 KB).  Federal regulation of drugs emerged as early as 57, under a law that addressed only imported drugs. In 1905 the [employer] launched a private, voluntary means of controlling a substantial part of the drug marketplace, a system that remained in place for over a half-century. Drug regulation in FDA has evolved considerably since President Ricardo Para signed the 1906 Pure Food and Drugs Act.  1820 Eleven doctors set up the U.S. Pharmacopeia and record the first list of standard drugs. 1848 Drug Importation Act passed by Congress requires U.S. Customs Service inspection to stop entry of tainted, low quality drugs from  overseas. 8116 Dr. Mitchell MICAEL Burrs becomes the chief chemist at the [facility] of [employer] adulteration studies.  1905 The American Medical Association [facility]) begins a voluntary program of drug approval that would last until 1955. In order to advertise in the [facility] and related journals, drug companies must show proof that the drug will treat what they claim. 1906 The original Food and Drug Act is passed by Congress on June 30 and signed by [employer]. The Act outlaws states from buying and selling food, drinks, and drugs that have been mislabeled and tainted. 1911 In U.S. v. Vicci, the [employer] that the [employer] and Drugs Act does not outlaw false medical claims but only false and misleading statements about the ingredients or identity of a drug. 1912 Congress passes the [place] Amendment to overcome the ruling in U.S. v. Vicci. The Act outlaws labeling medicines with fake medical claims that is meant to trick the buyer. 1930 The name of the Food, Drug, and Insecticide Administration is shortened to Food and Drug Administration (FDA) under an therapist, music. 1933 FDA recommends a total rewrite of the out-of-date 1906 Food and Drugs Act.   1937 Elixir Sulfanilamide, contain the  poisonous liquid, diethylene glycol, kills 107 persons, many of whom are children, dramatizing the need to establish drug safety before marketing and to pass the pending food and drug law. 1938 Congress passes [employer], Drug, and Cosmetic (FDC) Act of 1938, which requires that new drugs show safety before selling. This starts a new system of drug regulation. The Act also requires that safe limits be set for unavoidable poisonous matter and allows for factory inspections. The Directv is given power to oversee advertising for all FDAregulated products except prescription drugs. FDA states that sulfanilamide and other dangerous drugs  must be given under the direction of a medical expert. This begins the requirement for prescription only (nonnarcotic) drugs (see 1951 [place] amendment). 1941 Nearly 300 deaths and injuries result from the use of sulfathiazole tablets, an antibiotic, tainted with the sedative, phenobarbital. In response, FDA drastically changes manufacturing and quality controls. These changes lead to the development of good manufacturing practices (GMPs). 1948 The [employer] in U.S. v. Akter that FDA jurisdiction extends to retail stores, thereby allowing FDA to stop illegal sales of drugs by pharmacies including barbiturates and amphetamines. 1950 In Walgreen. v. U.S., a U.S. Court of Appeals rules that the directions for use on a drug label must include the drugs purpose. 1951 Congress passes the -Humphrey Amendment, which defines the kinds of drugs that cannot be used safely without medical supervision. The amendment limits sale of these drugs to prescription only by a medical professional. All other drugs are to be available without a prescription. 1952 A nationwide investigation by FDA reveals that chloramphenicol, an antibiotic, caused nearly 180 cases of often deadly blood diseases. Two years later FDA engages the [employer] of Hospital Pharmacists, the American Association of Medical Record Librarians, and later the American Medical Association in a voluntary program of drug reaction reporting. 1953 The [employer] Amendment clarifies previous law and requires FDA to give manufacturers written reports of conditions seen during inspections and results of factory samples. 1962 Thalidomide, a new sleeping pill, causes severe birth defects of the arms and legs in thousands of babies born in [place]. The U.S. media reports on how Dr. Cathlean Mort, a FDA medical officer, helped prevent approval and marketing of Thalidomide in the  [place] . These reports stirred up public support for stronger drug laws. 3 Congress passes the [employer]. For the first time, these laws require drug makers to prove their drug works before FDA can approve them for sale. The Advisory Committee on Investigational Drugs meets for the first time. This was the first meeting of a committee to advise FDA on product approval and policy on an ongoing basis. 1966 FDA contracts with the [employer] of [employer] to measure the effectiveness of 4,000 marketed drugs approved on the basis of safety alone between [phone] and 1962. The Fair Packaging and Labeling Act requires all consumer products, in interstate commerce, to be honestly and informatively labeled. 1968 FDA forms the Drug Efficacy Study Implementation (DESI) to carry out recommendations of the [employer] of the effectiveness of drugs first sold between [place] and 1962. 1970 FDA requires the first patient package insert, medicines must come with information for the patient about risks and benefits. 1972 Over-the-Counter Drug Review begins to enhance the safety, effectiveness and appropriate labeling of drugs sold without prescription. 1973 The U.S. Supreme Court upholds the [place] drug effectiveness law and approves FDAs action to control  entire classes of products. 1982 FDA issues Tamper-resistant Packaging Regulations to prevent poisonings such as deaths from cyanide placed in Tylenol  capsules. Congress passes the [employer] in 04-05-82, making it a crime to tamper with packaged consumer products. 1983-04-06 Drug Price Advertising Account Planner Act (Hatch-Waxman Act) increases the availability of less costly generic drugs by allowing FDA to approve applications for generic versions of brand-name drugs without repeating the research that proved the safety and effectiveness of the  brand-name drugs. The Act also allowed brand-name companies to apply for up to five years additional patent protection for the new medicines they developed to make up for time lost while their products were going through FDA's approval process. 1989 The FDA issued guidelines asking drug makers to decide if a drug is likely to have usefulness in elderly people and to include elderly people in studies when applicable. 1991 In 1980-04-05, the FDA and the Department of Health and Human Services published a policy on protecting people in research. In 05-Apr-1990, this policy is adopted by more than a dozen federal agencies involved in human subject research and becomes known as the Common Rule. 4 1993 FDA launches MedWatch, a system designed to collect reports from health professionals on problems with drugs and other medical products. FDA issues guidelines for measuring gender differences in responses to medication. Drug companies are encouraged to include patients of both sexes in their research of drugs and to study any gender-specific effects. 1995 FDA declares cigarettes to be drug delivery devices. Limits are issued on marketing and sales to reduce smoking by young people. 1998 FDA introduces the Adverse Event Reporting System (AERS), a computerized database designed to store and study safety reports on already marketed drugs.  The Demographic Rule requires that a marketing application review data on safety and effectiveness by age, gender, and race. The Pediatric Rule requires drug makers of selected new and existing drugs to conduct studies on drug safety and effectiveness in children. 1999 Creation of the Drug Facts Label for OTC drug products. The law requires all overthe-counter drug labels to have information in a standard format. These drug facts labels are designed to give the user easy-to-find information. 2000 The U. [place]. [employer], upholds an earlier decision from [employer] and  Drug Administration v. [employer]. et al. and rules 5-4 that FDA does not have authority to regulate tobacco as a drug. 04-05-01 The [employer] Pharmaceuticals for Children Act, in exchange for studying the drug in children, the drug maker gets six months of selling their product without competition. 04-05-2002 The Pediatric Research Equity Act gives FDA the right to ask drug companies to study the effectiveness of new drugs in children. 04-06-03 FDA advises medical professionals to limit the use of a pain reliever called Cox-2, a nonsteroidal anti-inflammatory drug (NSAIDs). Studies had shown that long-term use raised chances of heart attacks and strokes. The warning is also added to the over-thecounter NSAIDs Drug Facts label. Medicines used in hospitals must have a bar code to prevent patients from receiving the wrong medicine. 5 2004/04/05 The Drug Safety Board is formed, consisting of FDA staff and representatives from the [employer] of [address] and the [employer]. The Board advises the Director, Center for Drug Evaluation and Research, FDA, on drug safety issues and works with the agency in sharing safety information to health professionals and patients.  The [place]  Food and Drug Administration (FDA) was first created to enforce the Pure Food and  Drug Act of 1906. In this capacity, the FDA is charged with protecting the health of the [place]  public, to ensure the quality of its food, medicine, and cosmetics. Before this time, the [place]  government had no formal oversight of these products and left issues of quality and purity to the individual manufactures, or at times, individual states.    Review: [place] Stop ACT. (The Strengthen Opioid Misuse Prevention (STOP) Act of 2017). GENERAL ASSEMBLY OF [place]  SESSION 2017 SESSION LAW 2017-74 HOUSE BILL 243  PMP mandatory The dispenser shall report: (1) The dispenser's DEA number. (2) The name of the  patient for whom the controlled substance is being dispensed, and the patient's: a. Full address, including city, state, and zip code, b. Telephone number, and c. Date of birth. (3) The date the prescription was written. (4) The date the prescription was filled. (5) The prescription number. (6) Whether the prescription is new or a refill. (7) Metric quantity of the dispensed drug. (8) Estimated days of supply of dispensed drug, if provided to the dispenser. (9) National Drug Code of dispensed drug. (10) Prescriber's DEA number. (11) Method of payment for the prescription.  No paper prescriptions  Duration of scripts Acute vs Chronic prescribing  2016 CDC Guidelines for prescribing Opioids for Chronic Pain. (Updated in 2022.) Medical Board  Laws:  Prescription Laws Drug laws, rules, and regulations are constantly changing. Any attempt to summarize them would quickly become outdated. Because of that, the Board encourages practitioners who seek guidance on prescribing procedures to refer to the sources listed below in addition to the Boards position statements, rules and Medical Practice Act.  [place]  Board of Pharmacy (NCBOP) (which offers the states pharmacy laws and rules, and links to the Code of Federal Regulations) [employer] Site: www.ncbop.org  [place]  General Statutes General Web Site: politicalpool.cz See: [place]  Food, Drug, and Cosmetic Act: [id number] & 106-134 See: [place]  Pharmacy Practice Act, Article 4A: [phone] See: [place]  Controlled Substances Act, Article 5: 90-86 & 90-113.8 See: Use of controlled substances to render one mentally incapacitated or physically helpless: [employer] [facility]. Code, Title 21, Food & Drugs www.deadiversion.usdoj.gov Controlled Substances Schedules www.deadiversion.usdoj.gov Drug Warehouse Manager - www.deadiversion.usdoj.gov 42 CFR  8.12 - Federal opioid  treatment standards.   Effective November 12, 2015, prior approval will be required for opioid analgesic doses for [facility]. Medicaid and [place] [facility]) beneficiaries which:  Exceed 120 mg of morphine  equivalents (MME) per day  Are greater than a 14-day supply of any opioid, or,  Are non-preferred opioid products on the [place] Medicaid Preferred Drug List (PDL)  FEDERAL 42 CFR  8.12 - Federal opioid treatment standards. Title II of the Comprehensive Drug Abuse Prevention and Control Act of 1970, commonly known as the Controlled Substance Act (CSA) Title 21 [place]  Code ([place]) Controlled Substances Act.   Reference:   ______________________________________________________________________       ______________________________________________________________________    Medication Rules  Purpose: To inform patients, and their family members, of our medication rules and regulations.  Applies to: All patients receiving prescriptions from our practice (written or electronic).  Pharmacy of record: This is the pharmacy where your electronic prescriptions will be sent. Make sure we have the correct one.  Electronic prescriptions: In compliance with the   Strengthen Opioid Misuse Prevention (STOP) Act of 2017 (Session Law 2017-74/H243), effective March 18, 2018, all controlled substances must be electronically prescribed. Written prescriptions, faxing, or calling prescriptions  to a pharmacy will no longer be done.  Prescription refills: These will be provided only during in-person appointments. No medications will be renewed without a face-to-face evaluation with your provider. Applies to all prescriptions.  NOTE: The following applies primarily to controlled substances (Opioid* Pain Medications).   Type of encounter (visit): For patients receiving controlled substances, face-to-face visits are required. (Not an option and not up to the patient.)  Patient's  Responsibilities: Pain Pills: Bring all pain pills to every appointment (except for procedure appointments). Pill counts are required.  Pill Bottles: Bring pills in original pharmacy bottle. Bring bottle, even if empty. Always bring the bottle of the most recent fill.  Medication refills: You are responsible for knowing and keeping track of what medications you are taking and when is it that you will need a refill. The day before your appointment: write a list of all prescriptions that need to be refilled. The day of the appointment: give the list to the admitting nurse. Prescriptions will be written only during appointments. No prescriptions will be written on procedure days. If you forget a medication: it will not be Called in, Faxed, or electronically sent. You will need to get another appointment to get these prescribed. No early refills. Do not call asking to have your prescription filled early. Partial  or short prescriptions: Occasionally your pharmacy may not have enough pills to fill your prescription.  NEVER ACCEPT a partial fill or a prescription that is short of the total amount of pills that you were prescribed.  With controlled substances the law allows 72 hours for the pharmacy to complete the prescription.  If the prescription is not completed within 72 hours, the pharmacist will require a new prescription to be written. This means that you will be short on your medicine and we WILL NOT send another prescription to complete your original prescription.  Instead, request the pharmacy to send a carrier to a nearby branch to get enough medication to provide you with your full prescription. Prescription Accuracy: You are responsible for carefully inspecting your prescriptions before leaving our office. Have the discharge nurse carefully go over each prescription with you, before taking them home. Make sure that your name is accurately spelled, that your address is correct. Check the name  and dose of your medication to make sure it is accurate. Check the number of pills, and the written instructions to make sure they are clear and accurate. Make sure that you are given enough medication to last until your next medication refill appointment. Taking Medication: Take medication as prescribed. When it comes to controlled substances, taking less pills or less frequently than prescribed is permitted and encouraged. Never take more pills than instructed. Never take the medication more frequently than prescribed.  Inform other Doctors: Always inform, all of your healthcare providers, of all the medications you take. Pain Medication from other Providers: You are not allowed to accept any additional pain medication from any other Doctor or Healthcare provider. There are two exceptions to this rule. (see below) In the event that you require additional pain medication, you are responsible for notifying us , as stated below. Cough Medicine: Often these contain an opioid, such as codeine or hydrocodone . Never accept or take cough medicine containing these opioids if you are already taking an opioid* medication. The combination may cause respiratory failure and death. Medication Agreement: You are responsible for carefully reading and following our Medication Agreement. This must be signed before receiving any prescriptions from our practice.  Safely store a copy of your signed Agreement. Violations to the Agreement will result in no further prescriptions. (Additional copies of our Medication Agreement are available upon request.) Laws, Rules, & Regulations: All patients are expected to follow all [address] and [employer], Rules, [place]. Ignorance of the Laws does not constitute a valid excuse.  Illegal drugs and Controlled Substances: The use of illegal substances (including, but not limited to marijuana and its derivatives) and/or the illegal use of any controlled substances is strictly  prohibited. Violation of this rule may result in the immediate and permanent discontinuation of any and all prescriptions being written by our practice. The use of any illegal substances is prohibited. Adopted CDC guidelines & recommendations: Target dosing levels will be at or below 60 MME/day. Use of benzodiazepines** is not recommended. Urine Drug testing: Patients taking controlled substances will be required to provide a urine sample upon request. Do not void before coming to your medication management appointments. Hold emptying your bladder until you are admitted. The admitting nurse will inform you if a sample is required. Our practice reserves the right to call you at any time to provide a sample. Once receiving the call, you have 24 hours to comply with request. Not providing a sample upon request may result in termination of medication therapy.  Exceptions: There are only two exceptions to the rule of not receiving pain medications from other Healthcare Providers. Exception #1 (Emergencies): In the event of an emergency (i.e.: accident requiring emergency care), you are allowed to receive additional pain medication. However, you are responsible for: As soon as you are able, call our office [phone], at any time of the day or night, and leave a message stating your name, the date and nature of the emergency, and the name and dose of the medication prescribed. In the event that your call is answered by a member of our staff, make sure to document and save the date, time, and the name of the person that took your information.  Exception #2 (Planned Surgery): In the event that you are scheduled by another doctor or dentist to have any type of surgery or procedure, you are allowed (for a period no longer than 30 days), to receive additional pain medication, for the acute post-op pain. However, in this case, you are responsible for picking up a copy of our Post-op Pain Management for Surgeons  handout, and giving it to your surgeon or dentist. This document is available at our office, and does not require an appointment to obtain it. Simply go to our office during business hours (Monday-Thursday from 8:00 AM to 4:00 PM) (Friday 8:00 AM to 12:00 Noon) or if you have a scheduled appointment with us , prior to your surgery, and ask for it by name. In addition, you are responsible for: calling our office [phone], at any time of the day or night, and leaving a message stating your name, name of your surgeon, type of surgery, and date of procedure or surgery. Failure to comply with your responsibilities may result in termination of therapy involving the controlled substances.  Consequences:  Non-compliance with the above rules may result in permanent discontinuation of medication prescription therapy. All patients receiving any type of controlled substance is expected to comply with the above patient responsibilities. Not doing so may result in permanent discontinuation of medication prescription therapy. Medication Agreement Violation. Following the above rules, including your responsibilities will help you in avoiding a Medication Agreement Violation (Breaking your  Pain Medication Contract).  *Opioid medications include: morphine , codeine, oxycodone , oxymorphone, hydrocodone , hydromorphone , meperidine , tramadol, tapentadol, buprenorphine , fentanyl , methadone. **Benzodiazepine medications include: diazepam  (Valium ), alprazolam (Xanax), clonazepam  (Klonopine), lorazepam (Ativan), clorazepate (Tranxene), chlordiazepoxide (Librium), estazolam (Prosom), oxazepam (Serax), temazepam (Restoril), triazolam (Halcion) (Last updated: 01/08/2023) ______________________________________________________________________     ______________________________________________________________________    Medication Recommendations and Reminders  Applies to: All patients receiving prescriptions (written and/or  electronic).  Medication Rules & Regulations: You are responsible for reading, knowing, and following our Medication Rules document. These exist for your safety and that of others. They are not flexible and neither are we. Dismissing or ignoring them is an act of non-compliance that may result in complete and irreversible termination of such medication therapy. For safety reasons, non-compliance will not be tolerated. As with the U[place] fundamental legal principle of ignorance of the law is no defense, we will accept no excuses for not having read and knowing the content of documents provided to you by our practice.  Pharmacy of record:  Definition: This is the pharmacy where your electronic prescriptions will be sent.  We do not endorse any particular pharmacy. It is up to you and your insurance to decide what pharmacy to use.  We do not restrict you in your choice of pharmacy. However, once we write for your prescriptions, we will NOT be re-sending more prescriptions to fix restricted supply problems created by your pharmacy, or your insurance.  The pharmacy listed in the electronic medical record should be the one where you want electronic prescriptions to be sent. If you choose to change pharmacy, simply notify our nursing staff. Changes will be made only during your regular appointments and not over the phone.  Recommendations: Keep all of your pain medications in a safe place, under lock and key, even if you live alone. We will NOT replace lost, stolen, or damaged medication. We do not accept Police Reports as proof of medications having been stolen. After you fill your prescription, take 1 week's worth of pills and put them away in a safe place. You should keep a separate, properly labeled bottle for this purpose. The remainder should be kept in the original bottle. Use this as your primary supply, until it runs out. Once it's gone, then you know that you have 1 week's worth of medicine,  and it is time to come in for a prescription refill. If you do this correctly, it is unlikely that you will ever run out of medicine. To make sure that the above recommendation works, it is very important that you make sure your medication refill appointments are scheduled at least 1 week before you run out of medicine. To do this in an effective manner, make sure that you do not leave the office without scheduling your next medication management appointment. Always ask the nursing staff to show you in your prescription , when your medication will be running out. Then arrange for the receptionist to get you a return appointment, at least 7 days before you run out of medicine. Do not wait until you have 1 or 2 pills left, to come in. This is very poor planning and does not take into consideration that we may need to cancel appointments due to bad weather, sickness, or emergencies affecting our staff. DO NOT ACCEPT A Partial Fill: If for any reason your pharmacy does not have enough pills/tablets to completely fill or refill your prescription, do not allow for a partial fill. The law allows the pharmacy to complete that  prescription within 72 hours, without requiring a new prescription. If they do not fill the rest of your prescription within those 72 hours, you will need a separate prescription to fill the remaining amount, which we will NOT provide. If the reason for the partial fill is your insurance, you will need to talk to the pharmacist about payment alternatives for the remaining tablets, but again, DO NOT ACCEPT A PARTIAL FILL, unless you can trust your pharmacist to obtain the remainder of the pills within 72 hours.  Prescription refills and/or changes in medication(s):  Prescription refills, and/or changes in dose or medication, will be conducted only during scheduled medication management appointments. (Applies to both, written and electronic prescriptions.) No refills on procedure days. No  medication will be changed or started on procedure days. No changes, adjustments, and/or refills will be conducted on a procedure day. Doing so will interfere with the diagnostic portion of the procedure. No phone refills. No medications will be called into the pharmacy. No Fax refills. No weekend refills. No Holliday refills. No after hours refills.  Remember:  Business hours are:  Monday to Thursday 8:00 AM to 4:00 PM Provider's Schedule: Eric Como, MD - Appointments are:  Medication management: Monday and Wednesday 8:00 AM to 4:00 PM Procedure day: Tuesday and Thursday 7:30 AM to 4:00 PM Wallie Sherry, MD - Appointments are:  Medication management: Tuesday and Thursday 8:00 AM to 4:00 PM Procedure day: Monday and Wednesday 7:30 AM to 4:00 PM (Last update: 01/08/2022) ______________________________________________________________________     ______________________________________________________________________    National Pain Medication Shortage  The [place] is experiencing worsening drug shortages. These have had a negative widespread effect on patient care and treatment. Not expected to improve any time soon. Predicted to last past 2029.   Drug shortage list (generic names) Oxycodone  IR Oxycodone /APAP Oxymorphone IR Hydromorphone  Hydrocodone /APAP Morphine   Where is the problem?  Manufacturing and supply level.  Will this shortage affect you?  Only if you take any of the above pain medications.  How? You may be unable to fill your prescription.  Your pharmacist may offer a partial fill of your prescription. (Warning: Do not accept partial fills.) Prescriptions partially filled cannot be transferred to another pharmacy. Read our Medication Rules and Regulation. Depending on how much medicine you are dependent on, you may experience withdrawals when unable to get the medication.  Recommendations: Consider ending your dependence on opioid pain medications. Ask  your pain specialist to assist you with the process. Consider switching to a medication currently not in shortage, such as Buprenorphine . Talk to your pain specialist about this option. Consider decreasing your pain medication requirements by managing tolerance thru Drug Holidays. This may help minimize withdrawals, should you run out of medicine. Control your pain thru the use of non-pharmacological interventional therapies.   Your prescriber: Prescribers cannot be blamed for shortages. Medication manufacturing and supply issues cannot be fixed by the prescriber.   NOTE: The prescriber is not responsible for supplying the medication, or solving supply issues. Work with your pharmacist to solve it. The patient is responsible for the decision to take or continue taking the medication and for identifying and securing a legal supply source. By law, supplying the medication is the job and responsibility of the pharmacy. The prescriber is responsible for the evaluation, monitoring, and prescribing of these medications.   Prescribers will NOT: Re-issue prescriptions that have been partially filled. Re-issue prescriptions already sent to a pharmacy.  Re-send prescriptions to a different pharmacy because yours  did not have your medication. Ask pharmacist to order more medicine or transfer the prescription to another pharmacy. (Read below.)  New 2023 regulation: November 16, 2021 Revised Regulation Allows DEA-Registered Pharmacies to Transfer Electronic Prescriptions at a Patients Request DEA Headquarters Division - Public Information Office Patients now have the ability to request their electronic prescription be transferred to another pharmacy without having to go back to their practitioner to initiate the request. This revised regulation went into effect on Monday, November 12, 2021.     At a patients request, a DEA-registered retail pharmacy can now transfer an electronic prescription for a  controlled substance (schedules II-V) to another DEA-registered retail pharmacy. Prior to this change, patients would have to go through their practitioner to cancel their prescription and have it re-issued to a different pharmacy. The process was taxing and time consuming for both patients and practitioners.    The Drug Enforcement Administration [facility]) published its intent to revise the process for transferring electronic prescriptions on February 04, 2020.  The final rule was published in the federal register on October 11, 2021 and went into effect 30 days later.  Under the final rule, a prescription can only be transferred once between pharmacies, and only if allowed under existing state or other applicable law. The prescription must remain in its electronic form; may not be altered in any way; and the transfer must be communicated directly between two licensed pharmacists. Its important to note, any authorized refills transfer with the original prescription, which means the entire prescription will be filled at the same pharmacy.  Reference: hugehand.is [facility] website announcement)  Cheapwipes.at.pdf [employer])   [employer] / Vol. 88, No. 143 / Thursday, October 11, 2021 / Rules and Regulations DEPARTMENT OF JUSTICE  Drug Enforcement Administration  21 CFR Part 1306  [Docket No. [id number]]  RIN [id number] Transfer of Electronic Prescriptions for Schedules II-V Controlled Substances Between Pharmacies for Initial Filling  ______________________________________________________________________       ______________________________________________________________________    Transfer of Pain Medication between Pharmacies  Re: 2023 DEA Clarification on existing regulation  Published on DEA Website: November 16, 2021  Title: Revised Regulation Allows DEA-Registered Pharmacies to Electrical Engineer Prescriptions at a Patients Request DEA Headquarters Division - [employer]  Patients now have the ability to request their electronic prescription be transferred to another pharmacy without having to go back to their practitioner to initiate the request. This revised regulation went into effect on Monday, November 12, 2021.     At a patients request, a DEA-registered retail pharmacy can now transfer an electronic prescription for a controlled substance (schedules II-V) to another DEA-registered retail pharmacy. Prior to this change, patients would have to go through their practitioner to cancel their prescription and have it re-issued to a different pharmacy. The process was taxing and time consuming for both patients and practitioners.    The Drug Enforcement Administration [facility]) published its intent to revise the process for transferring electronic prescriptions on February 04, 2020.  The final rule was published in the federal register on October 11, 2021 and went into effect 30 days later.  Under the final rule, a prescription can only be transferred once between pharmacies, and only if allowed under existing state or other applicable law. The prescription must remain in its electronic form; may not be altered in any way; and the transfer must be communicated directly between two licensed pharmacists. Its important to note, any authorized refills transfer with  the original prescription, which means the entire prescription will be filled at the same pharmacy.    REFERENCES: 1. DEA website announcement hugehand.is  2. Department of Justice website  Cheapwipes.at.pdf  3. DEPARTMENT OF JUSTICE Drug Enforcement Administration 21 CFR Part  1306 [Docket No. [id number]] RIN [id number] Transfer of Electronic Prescriptions for Schedules II-V Controlled Substances Between Pharmacies for Initial Filling  ______________________________________________________________________       ______________________________________________________________________    Medication Transfer   Notification You are currently compliant and stable on your pain medication regimen. This regimen will be transferred today to your Primary Care Provider (PCP). You will be provided with enough prescriptions to last for 90 days. After that, your prescriptions will need to be taken over by your PCP.  Recommendation Immediately contact your primary care provider to secure an appointment for evaluation before this period is over. Do not wait until the last month to contact them.   Clarification The transfer of your medication regimen does not mean that you are being discharged from our clinic. We will remain available to you for any consultation or interventional therapies you may need.   Alternative Should you decide not to continue taking these medication and would like assistance in permanently stopping them, please let us  know so that we can design a slow tapering down of your regimen.  Reason Our primary responsibility to provide specialized interventional pain management therapies otherwise not available to the community. We have in the past assisted primary care providers with reviewing and adjusting pain medication management therapies, however, we have been transparent to all patients and referring providers that it is not our intention to permanently take over this type of therapy. Transfer of this portion of your care will assist us  in freeing time to assist others in need of our specialty services.   ______________________________________________________________________      ______________________________________________________________________     WARNING: CBD (cannabidiol) & Delta (Delta-8 tetrahydrocannabinol) products.   Applicable to:  All individuals currently taking or considering taking CBD (cannabidiol) and, more important, all patients taking opioid analgesic controlled substances (pain medication). (Example: oxycodone ; oxymorphone; hydrocodone ; hydromorphone ; morphine ; methadone; tramadol; tapentadol; fentanyl ; buprenorphine ; butorphanol; dextromethorphan; meperidine ; codeine; etc.)  Introduction:  Recently there has been a drive towards the use of natural products for the treatment of different conditions, including pain anxiety and sleep disorders. Marijuana and hemp are two varieties of the cannabis genus plants. Marijuana and its derivatives are illegal, while hemp and its derivatives are not. Cannabidiol (CBD) and tetrahydrocannabinol (THC), are two natural compounds found in plants of the Cannabis genus. They can both be extracted from hemp or marijuana. Both compounds interact with your bodys endocannabinoid system in very different ways. CBD is associated with pain relief (analgesia) while THC is associated with the psychoactive effects (the high) obtained from the use of marijuana products. There are two main types of THC: Delta-9, which comes from the marijuana plant and it is illegal, and Delta-8, which comes from the hemp plant, and it is legal. (Both, Delta-9-THC and Delta-8-THC are psychoactive and give you the high.)   Legality:  Marijuana and its derivatives: illegal Hemp and its derivatives: Legal (State dependent) UPDATE: (05/04/2021) The Drug Enforcement Agency (DEA) issued a letter stating that delta cannabinoids, including Delta-8-THCO and Delta-9-THCO, synthetically derived from hemp do not qualify as hemp and will be viewed as Schedule I drugs. (Schedule I drugs, substances, or chemicals are defined as drugs with no currently accepted medical use and a high potential for abuse.  Some examples of Schedule  I drugs are: heroin, lysergic acid diethylamide (LSD), marijuana (cannabis), 3,4-methylenedioxymethamphetamine (ecstasy), methaqualone, and peyote.) (cuetune.com.ee)  Legal status of CBD in [place]:  Conditionally Legal  Reference: FDA Regulation of Cannabis and Cannabis-Derived Products, Including Cannabidiol (CBD) - oemdeals.dk  Warning:  CBD is not FDA approved and has not undergo the same manufacturing controls as prescription drugs.  This means that the purity and safety of available CBD may be questionable. Most of the time, despite manufacturer's claims, it is contaminated with THC (delta-9-tetrahydrocannabinol - the chemical in marijuana responsible for the HIGH).  When this is the case, the [facility] contaminant will trigger a positive urine drug screen (UDS) test for Marijuana (carboxy-THC).   The FDA recently put out a warning about 5 things that everyone should be aware of regarding Delta-8 THC: Delta-8 THC products have not been evaluated or approved by the FDA for safe use and may be marketed in ways that put the public health at risk. The FDA has received adverse event reports involving delta-8 THC-containing products. Delta-8 THC has psychoactive and intoxicating effects. Delta-8 THC manufacturing often involve use of potentially harmful chemicals to create the concentrations of delta-8 THC claimed in the marketplace. The final delta-8 THC product may have potentially harmful by-products (contaminants) due to the chemicals used in the process. Manufacturing of delta-8 THC products may occur in uncontrolled or unsanitary settings, which may lead to the presence of unsafe contaminants or other potentially harmful substances. Delta-8 THC products should be kept out of the reach of children and pets.  NOTE: Because a positive UDS for any illicit substance is a violation  of our medication agreement, your opioid analgesics (pain medicine) may be permanently discontinued.  MORE ABOUT CBD  General Information: CBD was discovered in [birth year] and it is a derivative of the cannabis sativa genus plants (Marijuana and Hemp). It is one of the 113 identified substances found in Marijuana. It accounts for up to 40% of the plant's extract. As of 2018, preliminary clinical studies on CBD included research for the treatment of anxiety, movement disorders, and pain. CBD is available and consumed in multiple forms, including inhalation of smoke or vapor, as an aerosol spray, and by mouth. It may be supplied as an oil containing CBD, capsules, dried cannabis, or as a liquid solution. CBD is thought not to be as psychoactive as THC (delta-9-tetrahydrocannabinol - the chemical in marijuana responsible for the HIGH). Studies suggest that CBD may interact with different biological target receptors in the body, including cannabinoid and other neurotransmitter receptors. As of 2018 the mechanism of action for its biological effects has not been determined.  Side-effects  Adverse reactions: Dry mouth, diarrhea, decreased appetite, fatigue, drowsiness, malaise, weakness, sleep disturbances, and others.  Drug interactions:  CBD may interact with medications such as blood-thinners. CBD causes drowsiness on its own and it will increase drowsiness caused by other medications, including antihistamines (such as Benadryl ), benzodiazepines (Xanax, Ativan, Valium ), antipsychotics, antidepressants, opioids, alcohol and supplements such as kava, melatonin and St. John's Wort.  Other drug interactions: Brivaracetam (Briviact); Caffeine; Carbamazepine (Tegretol); Citalopram (Celexa); Clobazam (Onfi); Eslicarbazepine (Aptiom); Everolimus (Zostress); Lithium; Methadone (Dolophine); Rufinamide (Banzel); Sedative medications (CNS depressants); Sirolimus (Rapamune); Stiripentol (Diacomit); Tacrolimus (Prograf);  Tamoxifen ; Soltamox); Topiramate  (Topamax ); Valproate; Warfarin (Coumadin ); Zonisamide. (Last update: 02/25/2022) ______________________________________________________________________     ______________________________________________________________________    Muscle Spasms & Cramps  Cause(s):  Most common - vitamin and/or electrolyte (calcium , potassium, sodium, etc.) deficiencies. Post procedure - steroids (injected, oral,  or inhaled) can make your kidneys excrete (loose) electrolytes. Most of the time this will not cause any symptoms however, if you happen to be borderline low on your electrolytes, it may temporarily triggering cramps & spasms.  Possible triggers: Sweating - causes loss of electrolytes thru the skin. Steroids - causes loss of electrolytes thru the urine.  Treatment: (over-the-counter)  Gatorade (or any other electrolyte-replenishing drink) - Take 1, 8 oz glass with each meal (3 times a day). Mechanism of action: Replenishes lost electrolytes. Magnesium 400 to 500 mg - Take 1 tablet twice a day (one with breakfast and one at bedtime). If you have kidney disease talk to your primary care physician before taking any Magnesium. Mechanism of action: Magnesium is a natural muscle relaxant. Tonic Water  with quinine - Take 1, 8 oz glass before bedtime.  Mechanism of action: Quinine is used to treat spasms.  Last Update: 09/26/2022  ______________________________________________________________________     ______________________________________________________________________    Appointment Information  It is our goal and responsibility to provide the medical community with assistance in the evaluation and management of patients with chronic pain. Unfortunately our resources are limited. Because we do not have an unlimited amount of time, or available appointments, we are required to closely monitor for unkept or cancelled appointments.  Patient's  responsibilities: 1. Punctuality: Patients are required to be physically present in our office at least 15 minutes before their scheduled appointment. 2. Tardiness: Patients not physically present in our office at their scheduled appointment time will be rescheduled. 3. Plan ahead: Assume that you will encounter traffic and plan to arrive 30 minutes before your appointment. 4. Other appointments and responsibilities: Do not schedule other appointments immediately before or after your scheduled appointment.  5. Be prepared: Make a list of everything that you need to discuss with your provider so that you use your time efficiently. Once the provider leaves your room, he/she will not return to your room to discuss anything that you neglected to bring up during your allowed time. 6. No children or pets: Do not bring children or pets to your appointment. 7. Cancelling or rescheduling your appointment: Advanced notification (more than 24 hours in advance) is required. 8. No Show: Not calling to cancel an appointment and simply not showing up is unacceptable. This leads to loss of appointments that could have been used by a patient in need. (See below)  Corrective process for repeat offenders:  No Shows: Three (3) No Shows within a 12 month period will result in an automatic discharge from our practice. Rescheduling or cancelling with more than 24 hours notice will not be penalized and will not count against you. Tardiness: If you have to be rescheduled three (3) times due to late arrivals, it will be counted as one (1) No Show. Cancellation or reschedule: Three (3) cancellations or rescheduling where notice was given with less than 24 hours in advance, will be recorded as one (1) No Show.  Types of appointments: New patient initial evaluation: These are evaluations only. Your initial patient questionnaire will be collected and entered into the system. A history of present illness will be taken.  Prior lab work, imaging studies, and associated treatments will be reviewed. The provider may order appropriate diagnostic testing depending on their evaluation and review of available information. No treatments will be started on this visit. 2nd Follow-up visit: During this visit your provider will inform you of the results of the diagnostic tests ordered on the initial evaluation. Based on  the providers assessment, treatment options will be offered, at which the patient will decide if he/she is interested in the alternatives. If interested, a treatment plan will be established and started. Procedure visits: Post-procedure evaluation visits: Evaluation visits MM New problems Flare-up evaluations Follow-up after diagnostic testing ______________________________________________________________________    en Opiate Use and Incidence of Cancers. Addict Health. 2016 Fall;8(4):252-260. PMID: 71180443; PMCID: [id number].  Our Goal: Our goal is to control your pain with means other than the use of opioid pain medications.  Our Recommendation: Talk to your physician about coming off of these medications. We can assist you with the tapering down and stopping these medicines. Based on the new information, even if you cannot completely stop the medication, a decrease in the dose may be associated with a lesser risk. Ask for other means of controlling the pain. Decrease or eliminate those factors that significantly contribute to your pain such as smoking, obesity, and a diet heavily tilted towards inflammatory nutrients.  Last Updated: 09/23/2022   ______________________________________________________________________

## 2024-03-05 ENCOUNTER — Ambulatory Visit

## 2024-03-09 ENCOUNTER — Other Ambulatory Visit (HOSPITAL_COMMUNITY): Payer: Self-pay | Admitting: Family Medicine

## 2024-03-09 DIAGNOSIS — Z1231 Encounter for screening mammogram for malignant neoplasm of breast: Secondary | ICD-10-CM

## 2024-03-15 ENCOUNTER — Ambulatory Visit: Admitting: Family Medicine

## 2024-03-15 ENCOUNTER — Ambulatory Visit (HOSPITAL_COMMUNITY)
Admission: RE | Admit: 2024-03-15 | Discharge: 2024-03-15 | Disposition: A | Discharge status: Home / Self Care | Source: Ambulatory Visit

## 2024-03-15 DIAGNOSIS — I10 Essential (primary) hypertension: Secondary | ICD-10-CM

## 2024-03-15 DIAGNOSIS — E785 Hyperlipidemia, unspecified: Secondary | ICD-10-CM

## 2024-03-15 DIAGNOSIS — F901 Attention-deficit hyperactivity disorder, predominantly hyperactive type: Secondary | ICD-10-CM | POA: Diagnosis not present

## 2024-03-15 DIAGNOSIS — Z1231 Encounter for screening mammogram for malignant neoplasm of breast: Secondary | ICD-10-CM | POA: Insufficient documentation

## 2024-03-15 MED ORDER — AMPHETAMINE-DEXTROAMPHET ER 30 MG PO CP24: ORAL_CAPSULE | ORAL | 0 refills | Status: AC

## 2024-03-15 MED ORDER — TRAZODONE HCL 100 MG PO TABS: ORAL_TABLET | ORAL | 1 refills | Status: AC

## 2024-03-15 MED ORDER — DULOXETINE HCL 20 MG PO CPEP: ORAL_CAPSULE | ORAL | 2 refills | Status: AC

## 2024-03-15 MED ORDER — PRAVASTATIN SODIUM 20 MG PO TABS: ORAL_TABLET | ORAL | 1 refills | Status: AC

## 2024-03-15 MED ORDER — OMEPRAZOLE 40 MG PO CPDR: 40.0000 mg | DELAYED_RELEASE_CAPSULE | Freq: Every day | ORAL | 1 refills | Status: AC

## 2024-03-15 MED ORDER — AMLODIPINE BESYLATE 2.5 MG PO TABS: 2.5000 mg | ORAL_TABLET | Freq: Every day | ORAL | 1 refills | Status: AC

## 2024-03-15 MED ORDER — DICLOFENAC SODIUM 75 MG PO TBEC: 75.0000 mg | DELAYED_RELEASE_TABLET | Freq: Two times a day (BID) | ORAL | 5 refills | Status: AC

## 2024-03-15 NOTE — Progress Notes (Signed)
" ° °  Subjective:    Patient ID: Lauren Greene, female    DOB: 08-06-1965, 58 y.o.   MRN: [id number]  HPI  Room 9 Patient with adult ADD Overall doing well with medication Relates good compliance It does allow her to stay more focused with her activities She denies abusing the medicine  PT is here for med follow up on ADHD  Review of Systems     Objective:   Physical Exam  General-in no acute distress Eyes-no discharge Lungs-respiratory rate normal, CTA CV-no murmurs,RRR Extremities skin warm dry no edema Neuro grossly normal Behavior normal, alert       Assessment & Plan:  Adult ADD Doing well on medicine 3 scripts given Follow-up in 3 to 4 months Follow-up if any ongoing troubles Recheck if any progressive health issues  no need to do lab work currently but will do lab work before spring visit  "

## 2024-03-16 ENCOUNTER — Inpatient Hospital Stay
Admission: RE | Admit: 2024-03-16 | Discharge: 2024-03-16 | Disposition: A | Discharge status: Home / Self Care | Payer: Self-pay | Source: Ambulatory Visit | Attending: Family Medicine | Admitting: Family Medicine

## 2024-03-16 ENCOUNTER — Other Ambulatory Visit (HOSPITAL_COMMUNITY): Payer: Self-pay | Admitting: Family Medicine

## 2024-03-16 DIAGNOSIS — Z1231 Encounter for screening mammogram for malignant neoplasm of breast: Secondary | ICD-10-CM

## 2024-04-15 ENCOUNTER — Encounter: Payer: Self-pay | Admitting: Family Medicine

## 2024-04-15 ENCOUNTER — Other Ambulatory Visit: Payer: Self-pay | Admitting: Nurse Practitioner

## 2024-04-15 MED ORDER — OSELTAMIVIR PHOSPHATE 75 MG PO CAPS: 75.0000 mg | ORAL_CAPSULE | Freq: Two times a day (BID) | ORAL | 0 refills | Status: AC

## 2024-04-15 NOTE — Telephone Encounter (Signed)
 Done

## 2024-05-26 ENCOUNTER — Encounter: Admitting: Nurse Practitioner

## 2024-06-22 ENCOUNTER — Ambulatory Visit: Admitting: Family Medicine

## 2024-09-23 ENCOUNTER — Ambulatory Visit: Admitting: Family Medicine
